# Patient Record
Sex: Female | Born: 1955
Health system: Southern US, Community
[De-identification: ages and names within clinical notes are randomized; demographics above are authoritative.]

## PROBLEM LIST (undated history)

## (undated) DIAGNOSIS — G4730 Sleep apnea, unspecified: Secondary | ICD-10-CM

## (undated) DIAGNOSIS — K760 Fatty (change of) liver, not elsewhere classified: Secondary | ICD-10-CM

## (undated) DIAGNOSIS — G8929 Other chronic pain: Secondary | ICD-10-CM

## (undated) DIAGNOSIS — E079 Disorder of thyroid, unspecified: Secondary | ICD-10-CM

## (undated) DIAGNOSIS — K648 Other hemorrhoids: Secondary | ICD-10-CM

## (undated) DIAGNOSIS — R109 Unspecified abdominal pain: Secondary | ICD-10-CM

## (undated) DIAGNOSIS — K7460 Unspecified cirrhosis of liver: Secondary | ICD-10-CM

## (undated) DIAGNOSIS — M79672 Pain in left foot: Secondary | ICD-10-CM

## (undated) DIAGNOSIS — E559 Vitamin D deficiency, unspecified: Secondary | ICD-10-CM

## (undated) DIAGNOSIS — E039 Hypothyroidism, unspecified: Secondary | ICD-10-CM

## (undated) DIAGNOSIS — Z87442 Personal history of urinary calculi: Secondary | ICD-10-CM

## (undated) DIAGNOSIS — D689 Coagulation defect, unspecified: Secondary | ICD-10-CM

## (undated) DIAGNOSIS — M797 Fibromyalgia: Secondary | ICD-10-CM

## (undated) DIAGNOSIS — N200 Calculus of kidney: Secondary | ICD-10-CM

## (undated) DIAGNOSIS — F419 Anxiety disorder, unspecified: Secondary | ICD-10-CM

## (undated) DIAGNOSIS — I2699 Other pulmonary embolism without acute cor pulmonale: Secondary | ICD-10-CM

## (undated) DIAGNOSIS — F329 Major depressive disorder, single episode, unspecified: Secondary | ICD-10-CM

## (undated) DIAGNOSIS — N939 Abnormal uterine and vaginal bleeding, unspecified: Secondary | ICD-10-CM

## (undated) DIAGNOSIS — M069 Rheumatoid arthritis, unspecified: Secondary | ICD-10-CM

## (undated) DIAGNOSIS — R945 Abnormal results of liver function studies: Secondary | ICD-10-CM

## (undated) DIAGNOSIS — K219 Gastro-esophageal reflux disease without esophagitis: Secondary | ICD-10-CM

## (undated) DIAGNOSIS — R112 Nausea with vomiting, unspecified: Secondary | ICD-10-CM

## (undated) DIAGNOSIS — N809 Endometriosis, unspecified: Secondary | ICD-10-CM

## (undated) DIAGNOSIS — M1990 Unspecified osteoarthritis, unspecified site: Secondary | ICD-10-CM

## (undated) DIAGNOSIS — K2970 Gastritis, unspecified, without bleeding: Secondary | ICD-10-CM

## (undated) DIAGNOSIS — F32A Depression, unspecified: Secondary | ICD-10-CM

## (undated) DIAGNOSIS — Z8489 Family history of other specified conditions: Secondary | ICD-10-CM

## (undated) DIAGNOSIS — K589 Irritable bowel syndrome without diarrhea: Secondary | ICD-10-CM

## (undated) DIAGNOSIS — I10 Essential (primary) hypertension: Secondary | ICD-10-CM

## (undated) DIAGNOSIS — L4050 Arthropathic psoriasis, unspecified: Secondary | ICD-10-CM

## (undated) DIAGNOSIS — Z9889 Other specified postprocedural states: Secondary | ICD-10-CM

## (undated) HISTORY — PX: TUBAL LIGATION: SHX77

## (undated) HISTORY — DX: Other pulmonary embolism without acute cor pulmonale: I26.99

## (undated) HISTORY — DX: Rheumatoid arthritis, unspecified: M06.9

## (undated) HISTORY — DX: Unspecified cirrhosis of liver: K74.60

## (undated) HISTORY — DX: Anxiety disorder, unspecified: F41.9

## (undated) HISTORY — DX: Coagulation defect, unspecified: D68.9

## (undated) HISTORY — PX: ABDOMINAL SURGERY: SHX537

## (undated) HISTORY — DX: Gastro-esophageal reflux disease without esophagitis: K21.9

## (undated) HISTORY — DX: Abnormal results of liver function studies: R94.5

## (undated) HISTORY — DX: Abnormal uterine and vaginal bleeding, unspecified: N93.9

## (undated) HISTORY — DX: Endometriosis, unspecified: N80.9

## (undated) HISTORY — DX: Unspecified osteoarthritis, unspecified site: M19.90

## (undated) HISTORY — PX: BREAST LUMPECTOMY: SHX2

## (undated) HISTORY — DX: Arthropathic psoriasis, unspecified: L40.50

## (undated) HISTORY — DX: Fatty (change of) liver, not elsewhere classified: K76.0

## (undated) HISTORY — DX: Calculus of kidney: N20.0

## (undated) HISTORY — DX: Morbid (severe) obesity due to excess calories: E66.01

## (undated) HISTORY — DX: Vitamin D deficiency, unspecified: E55.9

## (undated) HISTORY — PX: DILATION AND CURETTAGE OF UTERUS: SHX78

---

## 2002-02-12 ENCOUNTER — Ambulatory Visit (HOSPITAL_COMMUNITY): Admission: RE | Admit: 2002-02-12 | Discharge: 2002-02-12 | Payer: Self-pay | Admitting: Family Medicine

## 2002-02-12 ENCOUNTER — Encounter: Payer: Self-pay | Admitting: Family Medicine

## 2002-10-07 ENCOUNTER — Encounter: Payer: Self-pay | Admitting: Otolaryngology

## 2002-10-07 ENCOUNTER — Ambulatory Visit (HOSPITAL_COMMUNITY): Admission: RE | Admit: 2002-10-07 | Discharge: 2002-10-07 | Payer: Self-pay | Admitting: Otolaryngology

## 2004-04-19 ENCOUNTER — Ambulatory Visit (HOSPITAL_COMMUNITY): Admission: RE | Admit: 2004-04-19 | Discharge: 2004-04-19 | Payer: Self-pay | Admitting: General Surgery

## 2004-05-04 ENCOUNTER — Ambulatory Visit (HOSPITAL_COMMUNITY): Admission: RE | Admit: 2004-05-04 | Discharge: 2004-05-04 | Payer: Self-pay | Admitting: Family Medicine

## 2004-05-27 ENCOUNTER — Ambulatory Visit (HOSPITAL_COMMUNITY): Admission: RE | Admit: 2004-05-27 | Discharge: 2004-05-27 | Payer: Self-pay | Admitting: Family Medicine

## 2004-06-01 ENCOUNTER — Ambulatory Visit (HOSPITAL_COMMUNITY): Admission: RE | Admit: 2004-06-01 | Discharge: 2004-06-01 | Payer: Self-pay | Admitting: Family Medicine

## 2004-06-09 ENCOUNTER — Ambulatory Visit (HOSPITAL_COMMUNITY): Admission: RE | Admit: 2004-06-09 | Discharge: 2004-06-09 | Payer: Self-pay | Admitting: Pulmonary Disease

## 2004-07-10 HISTORY — PX: COLONOSCOPY: SHX174

## 2004-11-28 ENCOUNTER — Ambulatory Visit (HOSPITAL_COMMUNITY): Admission: RE | Admit: 2004-11-28 | Discharge: 2004-11-28 | Payer: Self-pay | Admitting: Family Medicine

## 2005-05-19 ENCOUNTER — Ambulatory Visit (HOSPITAL_COMMUNITY): Admission: RE | Admit: 2005-05-19 | Discharge: 2005-05-19 | Payer: Self-pay | Admitting: Family Medicine

## 2005-05-26 ENCOUNTER — Ambulatory Visit (HOSPITAL_COMMUNITY): Admission: RE | Admit: 2005-05-26 | Discharge: 2005-05-26 | Payer: Self-pay | Admitting: Family Medicine

## 2005-12-08 ENCOUNTER — Inpatient Hospital Stay (HOSPITAL_COMMUNITY): Admission: EM | Admit: 2005-12-08 | Discharge: 2005-12-13 | Payer: Self-pay | Admitting: Emergency Medicine

## 2005-12-08 DIAGNOSIS — I2699 Other pulmonary embolism without acute cor pulmonale: Secondary | ICD-10-CM

## 2005-12-08 HISTORY — DX: Other pulmonary embolism without acute cor pulmonale: I26.99

## 2006-07-05 ENCOUNTER — Ambulatory Visit (HOSPITAL_COMMUNITY): Admission: RE | Admit: 2006-07-05 | Discharge: 2006-07-05 | Payer: Self-pay | Admitting: Family Medicine

## 2006-08-22 ENCOUNTER — Ambulatory Visit: Payer: Self-pay | Admitting: Gastroenterology

## 2006-08-24 ENCOUNTER — Encounter (INDEPENDENT_AMBULATORY_CARE_PROVIDER_SITE_OTHER): Payer: Self-pay | Admitting: *Deleted

## 2006-08-24 ENCOUNTER — Ambulatory Visit: Payer: Self-pay | Admitting: Gastroenterology

## 2006-08-24 ENCOUNTER — Ambulatory Visit (HOSPITAL_COMMUNITY): Admission: RE | Admit: 2006-08-24 | Discharge: 2006-08-24 | Payer: Self-pay | Admitting: Gastroenterology

## 2006-08-24 HISTORY — PX: ESOPHAGOGASTRODUODENOSCOPY: SHX1529

## 2006-10-10 ENCOUNTER — Ambulatory Visit: Payer: Self-pay | Admitting: Gastroenterology

## 2006-11-27 ENCOUNTER — Ambulatory Visit: Payer: Self-pay | Admitting: Gastroenterology

## 2007-02-07 ENCOUNTER — Ambulatory Visit: Payer: Self-pay | Admitting: Gastroenterology

## 2007-04-03 ENCOUNTER — Ambulatory Visit (HOSPITAL_COMMUNITY): Admission: RE | Admit: 2007-04-03 | Discharge: 2007-04-03 | Payer: Self-pay | Admitting: Urology

## 2007-04-23 ENCOUNTER — Encounter: Admission: RE | Admit: 2007-04-23 | Discharge: 2007-04-23 | Payer: Self-pay | Admitting: Orthopedic Surgery

## 2007-11-13 ENCOUNTER — Ambulatory Visit (HOSPITAL_COMMUNITY): Admission: RE | Admit: 2007-11-13 | Discharge: 2007-11-13 | Payer: Self-pay | Admitting: Family Medicine

## 2007-11-18 ENCOUNTER — Ambulatory Visit (HOSPITAL_COMMUNITY): Admission: RE | Admit: 2007-11-18 | Discharge: 2007-11-18 | Payer: Self-pay | Admitting: Family Medicine

## 2008-04-17 ENCOUNTER — Ambulatory Visit (HOSPITAL_COMMUNITY): Admission: RE | Admit: 2008-04-17 | Discharge: 2008-04-17 | Payer: Self-pay | Admitting: Family Medicine

## 2008-06-01 ENCOUNTER — Ambulatory Visit (HOSPITAL_COMMUNITY): Admission: RE | Admit: 2008-06-01 | Discharge: 2008-06-01 | Payer: Self-pay | Admitting: Family Medicine

## 2008-06-24 ENCOUNTER — Encounter (INDEPENDENT_AMBULATORY_CARE_PROVIDER_SITE_OTHER): Payer: Self-pay | Admitting: Obstetrics and Gynecology

## 2008-06-24 ENCOUNTER — Ambulatory Visit (HOSPITAL_COMMUNITY): Admission: RE | Admit: 2008-06-24 | Discharge: 2008-06-24 | Payer: Self-pay | Admitting: Obstetrics and Gynecology

## 2008-07-06 ENCOUNTER — Ambulatory Visit (HOSPITAL_COMMUNITY): Admission: RE | Admit: 2008-07-06 | Discharge: 2008-07-06 | Payer: Self-pay | Admitting: Family Medicine

## 2008-07-09 DIAGNOSIS — K2970 Gastritis, unspecified, without bleeding: Secondary | ICD-10-CM

## 2008-07-09 DIAGNOSIS — G4730 Sleep apnea, unspecified: Secondary | ICD-10-CM | POA: Insufficient documentation

## 2008-07-09 DIAGNOSIS — E1169 Type 2 diabetes mellitus with other specified complication: Secondary | ICD-10-CM | POA: Insufficient documentation

## 2008-07-09 DIAGNOSIS — E782 Mixed hyperlipidemia: Secondary | ICD-10-CM | POA: Insufficient documentation

## 2008-07-09 DIAGNOSIS — N200 Calculus of kidney: Secondary | ICD-10-CM

## 2008-07-09 DIAGNOSIS — I10 Essential (primary) hypertension: Secondary | ICD-10-CM | POA: Insufficient documentation

## 2008-07-09 DIAGNOSIS — D1739 Benign lipomatous neoplasm of skin and subcutaneous tissue of other sites: Secondary | ICD-10-CM | POA: Insufficient documentation

## 2008-07-09 DIAGNOSIS — E785 Hyperlipidemia, unspecified: Secondary | ICD-10-CM

## 2008-07-09 DIAGNOSIS — I2699 Other pulmonary embolism without acute cor pulmonale: Secondary | ICD-10-CM | POA: Insufficient documentation

## 2008-07-09 DIAGNOSIS — F329 Major depressive disorder, single episode, unspecified: Secondary | ICD-10-CM

## 2008-07-09 DIAGNOSIS — K2990 Gastroduodenitis, unspecified, without bleeding: Secondary | ICD-10-CM

## 2008-07-09 DIAGNOSIS — F419 Anxiety disorder, unspecified: Secondary | ICD-10-CM | POA: Insufficient documentation

## 2008-07-09 DIAGNOSIS — J40 Bronchitis, not specified as acute or chronic: Secondary | ICD-10-CM | POA: Insufficient documentation

## 2008-07-09 DIAGNOSIS — R109 Unspecified abdominal pain: Secondary | ICD-10-CM | POA: Insufficient documentation

## 2008-07-09 HISTORY — DX: Other pulmonary embolism without acute cor pulmonale: I26.99

## 2008-07-09 HISTORY — DX: Calculus of kidney: N20.0

## 2008-07-27 ENCOUNTER — Ambulatory Visit (HOSPITAL_COMMUNITY): Admission: RE | Admit: 2008-07-27 | Discharge: 2008-07-27 | Payer: Self-pay | Admitting: Pulmonary Disease

## 2009-03-29 ENCOUNTER — Ambulatory Visit (HOSPITAL_COMMUNITY): Admission: RE | Admit: 2009-03-29 | Discharge: 2009-03-29 | Payer: Self-pay | Admitting: Internal Medicine

## 2009-04-01 ENCOUNTER — Encounter (INDEPENDENT_AMBULATORY_CARE_PROVIDER_SITE_OTHER): Payer: Self-pay | Admitting: *Deleted

## 2009-04-22 ENCOUNTER — Encounter: Payer: Self-pay | Admitting: Gastroenterology

## 2009-04-22 ENCOUNTER — Ambulatory Visit: Payer: Self-pay | Admitting: Gastroenterology

## 2009-04-22 DIAGNOSIS — R1310 Dysphagia, unspecified: Secondary | ICD-10-CM | POA: Insufficient documentation

## 2009-04-22 DIAGNOSIS — R1031 Right lower quadrant pain: Secondary | ICD-10-CM

## 2009-04-22 DIAGNOSIS — K589 Irritable bowel syndrome without diarrhea: Secondary | ICD-10-CM

## 2009-04-22 DIAGNOSIS — R1011 Right upper quadrant pain: Secondary | ICD-10-CM | POA: Insufficient documentation

## 2009-04-29 ENCOUNTER — Encounter: Payer: Self-pay | Admitting: Gastroenterology

## 2009-04-29 DIAGNOSIS — Z8639 Personal history of other endocrine, nutritional and metabolic disease: Secondary | ICD-10-CM

## 2009-04-29 DIAGNOSIS — Z862 Personal history of diseases of the blood and blood-forming organs and certain disorders involving the immune mechanism: Secondary | ICD-10-CM | POA: Insufficient documentation

## 2009-04-30 ENCOUNTER — Encounter: Payer: Self-pay | Admitting: Gastroenterology

## 2009-04-30 LAB — CONVERTED CEMR LAB
ALT: 39 units/L — ABNORMAL HIGH (ref 0–35)
Albumin: 4 g/dL (ref 3.5–5.2)
BUN: 12 mg/dL (ref 6–23)
CO2: 24 meq/L (ref 19–32)
Calcium: 9 mg/dL (ref 8.4–10.5)
Chloride: 102 meq/L (ref 96–112)
Creatinine, Ser: 0.76 mg/dL (ref 0.40–1.20)
Eosinophils Absolute: 0.1 10*3/uL (ref 0.0–0.7)
Eosinophils Relative: 1 % (ref 0–5)
HCT: 43.1 % (ref 36.0–46.0)
Hemoglobin: 14.7 g/dL (ref 12.0–15.0)
Lipase: 32 units/L (ref 0–75)
Lymphocytes Relative: 29 % (ref 12–46)
Lymphs Abs: 3.1 10*3/uL (ref 0.7–4.0)
MCV: 94.5 fL (ref 78.0–100.0)
Monocytes Relative: 9 % (ref 3–12)
Potassium: 3.7 meq/L (ref 3.5–5.3)
RBC: 4.56 M/uL (ref 3.87–5.11)
WBC: 10.4 10*3/uL (ref 4.0–10.5)

## 2009-05-03 ENCOUNTER — Ambulatory Visit (HOSPITAL_COMMUNITY): Admission: RE | Admit: 2009-05-03 | Discharge: 2009-05-03 | Payer: Self-pay | Admitting: Gastroenterology

## 2009-05-26 ENCOUNTER — Encounter (INDEPENDENT_AMBULATORY_CARE_PROVIDER_SITE_OTHER): Payer: Self-pay

## 2009-06-21 ENCOUNTER — Encounter: Payer: Self-pay | Admitting: Gastroenterology

## 2009-07-27 LAB — CONVERTED CEMR LAB
ALT: 36 units/L — ABNORMAL HIGH (ref 0–35)
Indirect Bilirubin: 0.4 mg/dL (ref 0.0–0.9)
Total Protein: 7.5 g/dL (ref 6.0–8.3)

## 2009-10-19 ENCOUNTER — Ambulatory Visit: Payer: Self-pay | Admitting: Gastroenterology

## 2009-12-21 ENCOUNTER — Ambulatory Visit: Payer: Self-pay | Admitting: Gastroenterology

## 2009-12-21 DIAGNOSIS — K219 Gastro-esophageal reflux disease without esophagitis: Secondary | ICD-10-CM

## 2009-12-21 HISTORY — DX: Gastro-esophageal reflux disease without esophagitis: K21.9

## 2010-03-16 ENCOUNTER — Encounter (HOSPITAL_COMMUNITY): Admission: RE | Admit: 2010-03-16 | Discharge: 2010-03-16 | Payer: Self-pay | Admitting: Orthopaedic Surgery

## 2010-04-02 ENCOUNTER — Emergency Department (HOSPITAL_COMMUNITY): Admission: EM | Admit: 2010-04-02 | Discharge: 2010-04-02 | Payer: Self-pay | Admitting: Emergency Medicine

## 2010-05-03 ENCOUNTER — Ambulatory Visit: Payer: Self-pay | Admitting: Gastroenterology

## 2010-07-31 ENCOUNTER — Encounter: Payer: Self-pay | Admitting: Obstetrics and Gynecology

## 2010-08-09 NOTE — Assessment & Plan Note (Signed)
Summary: IBS-MIXED   Visit Type:  Follow-up Visit Referring Provider:  JIRCVEL  Primary Care Provider:  Hilma Favors, M.D.   History of Present Illness: C/o dry mouth. BMs: better-at least every 2 days. Taking Amitiza two times a day. Imipramine helping with depression and bowels. No acid medicines. Told she had reflux by a doctor due to her hoarseness. Probiotics worked really.   Current Medications (verified): 1)  Bisoprolol-Hydrochlorothiazide 5-6.25 Mg Tabs (Bisoprolol-Hydrochlorothiazide) .... Once Daily 2)  Meclizine Hcl 25 Mg Tabs (Meclizine Hcl) .... Four Times A Day As Needed 3)  Xanax 1 Mg Tabs (Alprazolam) .... Take 1 Tablet By Mouth Three Times A Day As Needed 4)  Restasis 0.05 % Emul (Cyclosporine) .... Two Times A Day 5)  Tylenol Extra Strength 500 Mg Tabs (Acetaminophen) .... As Needed 6)  Omega-3 1000 Mg Caps (Omega-3 Fatty Acids) .... Once A Day 7)  Micardis 40 Mg Tabs (Telmisartan) .... Take 1 Tablet By Mouth Once A Day 8)  Savella 100 Mg Tabs (Milnacipran Hcl) .... Take 1 Tablet By Mouth Two Times A Day 9)  Stool Softner .... One To Two Tablets Daily 10)  Excedrin Tension .... As Needed 11)  Moisturizing Dry Eye Solution .... As Needed 12)  Albuterol  Neb .... As Needed 13)  C-Pap .... Nightly 14)  Concerta 36 Mg Cr-Tabs (Methylphenidate Hcl) .... Take 1 Tablet By Mouth Once A Day 15)  Tramadol Hcl 50 Mg Tabs (Tramadol Hcl) .... One -Two Tablets Every 8 Hours As  Needed 16)  Zolpidem Tartrate 10 Mg Tabs (Zolpidem Tartrate) .... Take 1 Tab By Mouth At Bedtime As Needed 17)  Levothroid 50 Mcg Tabs (Levothyroxine Sodium) .... Take 1 Tablet By Mouth Once A Day 18)  Clotrimazole and Betamethasone Cream .... As Directed 19)  Vitamin D2000 Mcg .... One Tablet Daily 20)  Azo .... As Needed 21)  Imipramine Hcl 10 Mg Tabs (Imipramine Hcl) .Marland Kitchen.. 1 By Mouth At Bedtime For 3 Days Then 2 By Mouth At Bedtime For Bedtime Then 3 By Mouth Qhs 22)  Amitiza 8 Mcg Caps (Lubiprostone) .Marland Kitchen.. 1  By Mouth At Bedtime For 7 Days Then Bid 23)  Levothroxine .Marland Kitchen.. 39mg Once A Day 24)  Methylphenidate ..Marland KitchenMarland KitchenMarland Kitchen378mOnce Daily  Allergies: 1)  ! Sulfa  Past History:  Past Medical History: Last updated: 10/19/2009 Chronic lower abdominal pain and intermittent loose stools and constipation likely secondary to irritable bowel syndrome Morbid obesity Sleep apnea Heme-positive stools secondary to hemorrhoids and gastritis ** EGD Feb 2008: BX-mild to moderate chronic gastritis. 2006 Colonoscopy: Internal hemorrhoids (MJ). 2010: LUQ Shingles  Depression History of pulmonary embolus requiring Coumadin therapy Hypertension Kidney stones Hyperlipidemia Chronic bronchitis History of pneumonia  Past Surgical History: Last updated: 04/22/2009 c-section x2 kidney stone removal R breast cyst removal exploratory laparoscopy endometrial polypectomy, D&C in 2009  Social History: Last updated: 12/21/2009 Smokes 1 pk per year. Now not smoking. Rare glass of wine. 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 Transferred jobs. Never has a chance to slow down. Doesn't like her new job. 2 children. divorced. She is raising her deceased niece's daughter who is 1232ears old  Social History: Smokes 1 pk per year. Now not smoking. Rare glass of wine. 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 Transferred jobs. Never has a chance to slow down. Doesn't like her new job. 2 children. divorced. She is raising  her deceased niece's daughter who is 55 years old  Vital Signs:  Patient profile:   55 year old female Height:      64 inches Weight:      243 pounds BMI:     41.86 Temp:     98.0 degrees F oral Pulse rate:   76 / minute BP sitting:   122 / 82  (right arm)  Vitals Entered By: Burnadette Peter LPN (December 22, 347 6:11 PM)  Physical Exam  General:  Well  developed, well nourished, no acute distress. Head:  Normocephalic and atraumatic. Lungs:  Clear throughout to auscultation. Heart:  Regular rate and rhythm; no murmurs. Abdomen:  Soft, nontender and nondistended.Normal bowel sounds.  Impression & Recommendations:  Problem # 1:  IRRITABLE BOWEL SYNDROME (ICD-564.1) Assessment Improved  Continue Imipramine. Continue Amitiza. Increase to 24 micrograms two times a day. Cautioned it may cause nausea. DA-C daily, #60 given. OPV in 4 mos.  Orders: Est. Patient Level III (64353)  Problem # 2:  GERD (ICD-530.81) Assessment: Unchanged  Add OMP 30 minutes prior to meals. OPV in 4 mos. Continue with weight loss effort.  CC: PCP  Orders: Est. Patient Level III (91225) Prescriptions: PRILOSEC 20 MG CPDR (OMEPRAZOLE) 1 30 minutes prior to meals  #30 x 5   Entered and Authorized by:   Danie Binder MD   Signed by:   Danie Binder MD on 12/21/2009   Method used:   Electronically to        [facility] (retail)       [place] [address] [place], [place]  [address]       Ph: [phone]       Fax: [phone]   RxID:   [id number] AMITIZA 24 MCG CAPS (LUBIPROSTONE) 1 by mouth BID  #60 x 5   Entered and Authorized by:   Danie Binder MD   Signed by:   Danie Binder MD on 12/21/2009   Method used:   Electronically to        [facility] (retail)       [place] [address] [place], [place]  [address]       Ph: [phone]       Fax: [phone]   RxID:   [id number]

## 2010-08-09 NOTE — Assessment & Plan Note (Signed)
Summary: IBS-MIXED   Visit Type:  Follow-up Visit  Chief Complaint:  F/U abd pain/no appetite.  History of Present Illness: Hurting in her stomach. Upr and rights side,and RLQ. No appetite. Makes self eat or drink. Eat and then pain or when she wakes up she has pain. Like IBS-went all the time and now constipation. Taken OTC stool softeners. Eating vegetarian. If eats beef or steak it really hurts. All kinds of stress: work, adult children in the home (2) plus grandkids, taking care of dead niece's child, money. Feels fluttering in heart. Nausea all the the time. No vomiting. Rare problems swallowing, not often.    Current Medications (verified): 1)  Bisoprolol-Hydrochlorothiazide 5-6.25 Mg Tabs (Bisoprolol-Hydrochlorothiazide) .... Once Daily 2)  Meclizine Hcl 25 Mg Tabs (Meclizine Hcl) .... Four Times A Day As Needed 3)  Xanax 1 Mg Tabs (Alprazolam) .... Take 1 Tablet By Mouth Three Times A Day As Needed 4)  Restasis 0.05 % Emul (Cyclosporine) .... Two Times A Day 5)  Tylenol Extra Strength 500 Mg Tabs (Acetaminophen) .... As Needed 6)  Omega-3 350 Mg Caps (Omega-3 Fatty Acids) .... 3 Once Daily 7)  Micardis 40 Mg Tabs (Telmisartan) .... Take 1 Tablet By Mouth Once A Day 8)  Savella 100 Mg Tabs (Milnacipran Hcl) .... Take 1 Tablet By Mouth Two Times A Day 9)  Stool Softner .... One To Two Tablets Daily 10)  Excedrin Tension .... As Needed 11)  Moisturizing Dry Eye Solution .... As Needed 12)  Albuterol  Neb .... As Needed 13)  C-Pap .... Nightly 14)  Concerta 36 Mg Cr-Tabs (Methylphenidate Hcl) .... Take 1 Tablet By Mouth Once A Day 15)  Tramadol Hcl 50 Mg Tabs (Tramadol Hcl) .... One -Two Tablets Every 8 Hours As  Needed 16)  Dicyclomine Hcl 10 Mg Caps (Dicyclomine Hcl) .... As Needed 17)  Rezyst Im  Chew (Probiotic Product) .... One Chewable Tablet Daily 18)  Zolpidem Tartrate 10 Mg Tabs (Zolpidem Tartrate) .... Take 1 Tab By Mouth At Bedtime As Needed 19)  Levothroid 50 Mcg Tabs  (Levothyroxine Sodium) .... Take 1 Tablet By Mouth Once A Day 20)  Clotrimazole and Betamethasone Cream .... As Directed 21)  Vitamin D2000 Mcg .... One Tablet Daily 22)  Azo .... As Needed  Allergies (verified): 1)  ! Sulfa  Past History:  Past Surgical History: Last updated: 04/22/2009 c-section x2 kidney stone removal R breast cyst removal exploratory laparoscopy endometrial polypectomy, D&C in 2009  Past Medical History: Chronic lower abdominal pain and intermittent loose stools and constipation likely secondary to irritable bowel syndrome Morbid obesity Sleep apnea Heme-positive stools secondary to hemorrhoids and gastritis ** EGD Feb 2008: BX-mild to moderate chronic gastritis. 2006 Colonoscopy: Internal hemorrhoids (MJ). 2010: LUQ Shingles  Depression History of pulmonary embolus requiring Coumadin therapy Hypertension Kidney stones Hyperlipidemia Chronic bronchitis History of pneumonia  Family History: Reviewed history from 04/22/2009 and no changes required. mother, history of leukemia, diabetes, stroke Father, CAD with CABG Daughter, fibromyalgia and kidney stones 2 brothers with history depression, diabetes, kidney signs 2 nieces, one deceased, Wilson's disease No family history of colon cancer.  Social History: Smokes 1 ppd per year. Rare glass of wine. 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 Transferred jobs. Never has a chance to slow down. Doesn't like her new job. 2 children. divorced. She is raising her deceased niece's daughter who is 57 years old  Vital Signs:  Patient profile:  55 year old female Height:      64 inches Weight:      246 pounds BMI:     42.38 Temp:     98.2 degrees F oral Pulse rate:   80 / minute BP sitting:   110 / 76  (left arm) Cuff size:   large  Vitals Entered By: Waldon Merl LPN (October 19, 2109 7:35 PM)  Physical Exam  General:  Well  developed, well nourished, no acute distress. Head:  Normocephalic and atraumatic. Eyes:  PERRLA, no icterus. Mouth:  No deformity or lesions. Lungs:  Clear throughout to auscultation. Heart:  Regular rate and rhythm; no murmurs. Abdomen:  Soft, mild to mod  TTPx4 without guarding and without rebound and nondistended. Normal bowel sounds. Extremities:  No edema noted. Neurologic:  Alert and  oriented x4;  grossly normal neurologically.  Impression & Recommendations:  Problem # 1:  IRRITABLE BOWEL SYNDROME (ICD-564.1)  currently has many psycho social stressors. PT HAS IBS-MIXED PATTERN.  Stop using dicyclomine. Start Probiotics daily. Add Amitiza to help with BMs. Add Imipramine 10 mg at bedtime and increase to 30 mg. Avoid dairy. HO GIVEN. Return visit 2 mos.Samples of Amitiza and DA-IBS given. OPV in 2 mos.  cc: PCP  Orders: Est. Patient Level V (67014)  Patient Instructions: 1)  Stop using dicyclomine. 2)  Start Probiotics daily 3)  Add Amitiza to help with BMs 4)  Add Imipramine 10 mg at bedtime and increase to 30 mg. 5)  Avoid dairy. 6)  Return visit 2 mos. 7)  The medication list was reviewed and reconciled.  All changed / newly prescribed medications were explained.  A complete medication list was provided to the patient / caregiver. Prescriptions: AMITIZA 8 MCG CAPS (LUBIPROSTONE) 1 by mouth at bedtime for 7 days then BID  #60 x 5   Entered and Authorized by:   Danie Binder MD   Signed by:   Danie Binder MD on 10/19/2009   Method used:   Electronically to        [facility] (retail)       [place] [address] [place], [place]  [address]       Ph: [phone]       Fax: [phone]   RxID:   [id number] IMIPRAMINE HCL 10 MG TABS (IMIPRAMINE HCL) 1 by mouth at bedtime for 3 days then 2 by mouth at bedtime for bedtime then 3 by mouth qhs  #90 x 5   Entered and Authorized by:   Danie Binder MD   Signed by:   Danie Binder MD  on 10/19/2009   Method used:   Electronically to        [facility] (retail)       [place] [address]       [place],   [address]       Ph: [phone]       Fax: [phone]   RxID:   [id number]       Appended Document: IBS-MIXED 38monthfollow up appt made for June 14 w/ SLF

## 2010-08-09 NOTE — Assessment & Plan Note (Signed)
Summary: GERD, IBS   Visit Type:  Follow-up Visit Primary Care Provider:  Hilma Favors, M.D.   Chief Complaint:  F/U [place]/IBS.  History of Present Illness: Just here for follow up and doing fairly well.  Not taking OTc stool softener. Taking AMitiza two times a day and Imipramine. Sx under control. BM: daily. Rare abd pain. No hb/indigestion. No NV. Diarrhea: [place] 1x/week, likely something she's eating. Eating more of vegetarian diet. Making a steady decline on her weight. Feeling better. Lost 21 lbs since APR 2011.  Current Medications (verified): 1)  Bisoprolol-Hydrochlorothiazide 5-6.25 Mg Tabs (Bisoprolol-Hydrochlorothiazide) .... Once Daily 2)  Meclizine Hcl 25 Mg Tabs (Meclizine Hcl) .... Four Times A Day As Needed 3)  Xanax 1 Mg Tabs (Alprazolam) .... Take 1 Tablet By Mouth Three Times A Day As Needed 4)  Restasis 0.05 % Emul (Cyclosporine) .... Two Times A Day As Needed 5)  Tylenol Extra Strength 500 Mg Tabs (Acetaminophen) .... As Needed 6)  Omega-3 1000 Mg Caps (Omega-3 Fatty Acids) .... Once A Day 7)  Micardis 40 Mg Tabs (Telmisartan) .... Take 1 Tablet By Mouth Once A Day 8)  Savella 100 Mg Tabs (Milnacipran Hcl) .... Take 1 Tablet By Mouth Two Times A Day 9)  Stool Softner .... One To Two Tablets Daily As Needed 10)  Excedrin Tension .... As Needed 11)  Moisturizing Dry Eye Solution .... As Needed 12)  Albuterol  Neb .... As Needed 13)  C-Pap .... Nightly 14)  Concerta 36 Mg Cr-Tabs (Methylphenidate Hcl) .... Take 1 Tablet By Mouth Once A Day 15)  Tramadol Hcl 50 Mg Tabs (Tramadol Hcl) .... One -Two Tablets Every 8 Hours As  Needed 16)  Zolpidem Tartrate 10 Mg Tabs (Zolpidem Tartrate) .... Take 1 Tab By Mouth At Bedtime As Needed 17)  Levothroid 50 Mcg Tabs (Levothyroxine Sodium) .... Take 1 Tablet By Mouth Once A Day 18)  Clotrimazole and Betamethasone Cream .... As Directed 19)  Vitamin D2000 Mcg .... One Tablet Daily 20)  Azo .... As Needed 21)  Imipramine Hcl 10 Mg Tabs  (Imipramine Hcl) .Marland Kitchen.. 1 By Mouth At Bedtime For 3 Days Then 2 By Mouth At Bedtime For Bedtime Then 3 By Mouth Qhs 22)  Amitiza 24 Mcg Caps (Lubiprostone) .Marland Kitchen.. 1 By Mouth Bid 23)  Prilosec 20 Mg Cpdr (Omeprazole) .Marland Kitchen.. 1 30 Minutes Prior To Meals 24)  Oracia 40 Mg .... Use As Directed 25)  Finacea Cream For Face .... As Directed 26)  Ultravate 0.05 % Crea (Halobetasol Propionate) .... As Directed 27)  Optive Moisturizing Dry Eye Solution .... As Directed  Allergies (verified): 1)  ! Sulfa  Past History:  Past Surgical History: Last updated: 04/22/2009 c-section x2 kidney stone removal R breast cyst removal exploratory laparoscopy endometrial polypectomy, D&C in 2009  Past Medical History: Chronic lower abdominal pain and intermittent loose stools and constipation likely secondary to irritable bowel syndrome Severe obesity Sleep apnea Heme-positive stools secondary to hemorrhoids and gastritis ** EGD Feb 2008: BX-mild to moderate chronic gastritis. 2006 Colonoscopy: Internal hemorrhoids (MJ). 2010: LUQ Shingles  Depression History of pulmonary embolus requiring Coumadin therapy Hypertension Kidney stones Hyperlipidemia Chronic bronchitis History of pneumonia  Vital Signs:  Patient profile:   55 year old female Height:      64 inches Weight:      227 pounds BMI:     39.11 Temp:     97.7 degrees F oral Pulse rate:   76 / minute BP sitting:   130 /  82  (left arm)  Vitals Entered By: Waldon Merl LPN (May 03, 2022 1:18 PM)  Physical Exam  General:  Well developed, well nourished, no acute distress. Head:  Normocephalic and atraumatic. Lungs:  Clear throughout to auscultation. Heart:  Regular rate and rhythm; no murmurs. Abdomen:  Soft, nontender and nondistended.Normal bowel sounds. Extremities:  No edema noted.  Impression & Recommendations:  Problem # 1:  GERD (ICD-530.81) Assessment Improved use OMP as needed or once daily if Sx persist. OPV in 6 mos.  encouraged continue weight loss.  Problem # 2:  IRRITABLE BOWEL SYNDROME (ICD-564.1) Assessment: Improved Continue Imipramine & Amitiza. OPV in 6 mos.  CC: PCP Prescriptions: PRILOSEC 20 MG CPDR (OMEPRAZOLE) 1 30 minutes prior to meals  #30 x 5   Entered and Authorized by:   Danie Binder MD   Signed by:   Danie Binder MD on 05/03/2010   Method used:   Electronically to        [facility] (retail)       [place] [address] [place], [place]  [address]       Ph: [phone]       Fax: [phone]   RxID:   [id number] AMITIZA 24 MCG CAPS (LUBIPROSTONE) 1 by mouth BID  #60 x 5   Entered and Authorized by:   Danie Binder MD   Signed by:   Danie Binder MD on 05/03/2010   Method used:   Electronically to        [facility] (retail)       [place] [address] [place], [place]  [address]       Ph: [phone]       Fax: [phone]   RxID:   [id number] IMIPRAMINE HCL 10 MG TABS (IMIPRAMINE HCL) 1 by mouth at bedtime for 3 days then 2 by mouth at bedtime for bedtime then 3 by mouth qhs  #90 x 5   Entered and Authorized by:   Danie Binder MD   Signed by:   Danie Binder MD on 05/03/2010   Method used:   Electronically to        [facility] (retail)       [place] [address]       [place],   [address]       Ph: [phone]       Fax: [phone]   RxID:   [id number]   Appended Document: Orders Update    Clinical Lists Changes  Orders: Added new Service order of Est. Patient Level II (54360) - Signed      Appended Document: GERD, IBS 6 MON OPV REMINDER IS IN THE COMPUTER

## 2010-08-15 ENCOUNTER — Ambulatory Visit (HOSPITAL_COMMUNITY)
Admission: RE | Admit: 2010-08-15 | Discharge: 2010-08-15 | Disposition: A | Discharge status: Home / Self Care | Payer: PRIVATE HEALTH INSURANCE | Source: Ambulatory Visit | Attending: Family Medicine | Admitting: Family Medicine

## 2010-08-15 ENCOUNTER — Other Ambulatory Visit (HOSPITAL_COMMUNITY): Payer: Self-pay | Admitting: Family Medicine

## 2010-08-15 DIAGNOSIS — M5030 Other cervical disc degeneration, unspecified cervical region: Secondary | ICD-10-CM | POA: Insufficient documentation

## 2010-08-15 DIAGNOSIS — M542 Cervicalgia: Secondary | ICD-10-CM

## 2010-09-22 LAB — POCT I-STAT, CHEM 8
BUN: 15 mg/dL (ref 6–23)
Calcium, Ion: 1.08 mmol/L — ABNORMAL LOW (ref 1.12–1.32)
Chloride: 101 mEq/L (ref 96–112)
Creatinine, Ser: 0.8 mg/dL (ref 0.4–1.2)
Glucose, Bld: 300 mg/dL — ABNORMAL HIGH (ref 70–99)
TCO2: 28 mmol/L (ref 0–100)

## 2010-09-22 LAB — URINALYSIS, ROUTINE W REFLEX MICROSCOPIC
Leukocytes, UA: NEGATIVE
Nitrite: POSITIVE — AB
Specific Gravity, Urine: >1.03 — ABNORMAL HIGH (ref 1.005–1.030)
Urobilinogen, UA: 2 mg/dL — ABNORMAL HIGH (ref 0.0–1.0)

## 2010-09-22 LAB — URINE MICROSCOPIC-ADD ON

## 2010-10-24 LAB — BLOOD GAS, ARTERIAL
Acid-Base Excess: 4 mmol/L — ABNORMAL HIGH (ref 0.0–2.0)
Bicarbonate: 27.9 mEq/L — ABNORMAL HIGH (ref 20.0–24.0)
O2 Saturation: 95.9 %
TCO2: 23.7 mmol/L (ref 0–100)
pCO2 arterial: 41.4 mmHg (ref 35.0–45.0)
pO2, Arterial: 76.4 mmHg — ABNORMAL LOW (ref 80.0–100.0)

## 2010-10-26 ENCOUNTER — Encounter: Payer: Self-pay | Admitting: Gastroenterology

## 2010-11-22 NOTE — Op Note (Signed)
Yolanda [name], PETRIDES NO.:  [id number]   MEDICAL RECORD NO.:  [id number]          PATIENT TYPE:  AMB   LOCATION:  [place]                           FACILITY:  [facility]   PHYSICIAN:  Lenard Galloway, M.D.   DATE OF BIRTH:  10-May-1956   DATE OF PROCEDURE:  06/24/2008  DATE OF DISCHARGE:                               OPERATIVE REPORT   PREOPERATIVE DIAGNOSES:  1. Postmenopausal bleeding.  2. Endometrial thickening.  3. Cervical stenosis.   POSTOPERATIVE DIAGNOSES:  1. Postmenopausal bleeding.  2. Endometrial polyp.  3. Cervical stenosis.   PROCEDURE:  Intraoperative transvaginal ultrasound, hysteroscopic  polypectomy.   SURGEON:  Lenard Galloway, MD   ASSISTANT:  Karrie Doffing, [place]   ANESTHESIA:  General endotracheal, paracervical block with 1% lidocaine.   IV FLUIDS:  1000 mL [place] lactate.   ESTIMATED BLOOD LOSS:  Minimal.   URINE OUTPUT:  75 mL by I and O catheterization prior to procedure   SORBITAL DEFICIT:  30 mL.   COMPLICATIONS:  None.   INDICATIONS FOR THE PROCEDURE:  The patient is a 55 year old Caucasian  female who presents with postmenopausal bleeding.  The patient presented  to her primary care Seara Hinesley with an episode of prolonged and heavy  bleeding.  The [facility] ordered an ultrasound, which showed thickened  endometrium and the possibility of uterine septum with 2 separate  uterine cavities.  The patient was sent for gynecologic evaluation and  treatment.  In the office, it was not possible to do an endometrial  biopsy due to cervical stenosis on the patient's body habitus and length  of vagina.  The patient does have a history of prior abnormal uterine  bleeding, which at that time was successfully evaluated by [place]  endometrial biopsy, which was benign.  The patient's bleeding has  stopped, and a followup ultrasound in the office immediately pre-office  documented continued thickening of the endometrial stripe of  approximately 7-8  mm.  The patient has not taking any hormone therapy  and in fact has a history of prior pulmonary embolus.  A plan is now  made to proceed with an ultrasound guided hysteroscopy with D&C, after  risks, benefits, and alternatives are reviewed.   FINDINGS:  Intraoperative transvaginal ultrasound at the beginning of  the procedure documents an endometrial stripe of 8.5 mm.  No  intrauterine septum was appreciated.  The adnexal regions could not be  visualized well, but there were no obvious masses.   The cervix was noted to be stenotic and the cervix was somewhat flush  with the vagina.  With placement of the diagnostic hysteroscope, there  was a large sessile polyp across the posterior uterine fundus, which  measured approximately 2 x 1 cm.   SPECIMENS:  The endometrial polyp was sent to Pathology.   PROCEDURE:  The patient was reidentified in the preoperative hold area.  She did receive cefotetan 1 g IV for antibiotic prophylaxis and received  both PAS stockings and TED hose for DVT prophylaxis.   In the operating room, general endotracheal anesthesia was induced and  the patient was then  placed in the dorsal lithotomy position.  The  patient had a transvaginal ultrasound performed and the findings are as  noted above.  The bladder was sterilely catheterized of urine during the  ultrasound to improve visualization.   Just after the ultrasound was performed, the patient was on completely  prepped and draped.  An exam under anesthesia was performed, but was  limited due to the patient's body habitus.   A speculum was placed inside the vagina and a single-tooth tenaculum was  placed on the anterior cervical lip.  The cervix was dilated to a #21  Pratt dilator.  The diagnostic hysteroscope was inserted under the  continuous infusion of sorbitol solution.  The findings were as noted  above.  A sharp curettage of the polyp was performed and the polyp  forceps was used to remove pieces of  the polyp.  The diagnostic  hysteroscope was reinserted and there was still remaining polyp present  and the hysteroscope was therefore removed and continued curettage of  the endometrium and use of the polyp forceps was used to remove  remaining pieces of the polyp.  This was repeated a few additional times  to assure that the polyp had been removed, which was essentially came  out in entirety with the exception of one small strand.   A paracervical block was performed at the end of the procedure in  standard fashion using total of 10 mL of 1% lidocaine.   There were no complications to the procedure.  Hemostasis was good at  the end of the procedure.  The patient was therefore awakened and  extubated and escorted to the recovery room in stable and awake  condition.  All needle, instrument, and sponge counts were correct.      Lenard Galloway, M.D.  Electronically Signed     BES/MEDQ  D:  06/24/2008  T:  06/24/2008  Job:  [id number]

## 2010-11-22 NOTE — Assessment & Plan Note (Signed)
NAME:  Yolanda [name], Yolanda [name]             CHART#:  [id number]   DATE:  11/27/2006                       DOB:  03-11-1956   REFERRING PHYSICIAN:  Dr. Elsie Lincoln.   PROBLEM LIST:  1. Chronic lower abdominal pain and intermittent loose stools with      constipation likely secondary to irritable bowel syndrome.  2. Hemoccult positive stool secondary to hemorrhoids and gastritis.  3. Mild-to-moderate chronic gastritis.  4. Pulmonary embolus requiring Coumadin therapy.  5. Hypertension.  6. Kidney stones.  7. Depression.  8. Hyperlipidemia.  9. Bronchitis.  10.Sleep apnea.  11.Allergy to sulfa.   SUBJECTIVE:  Yolanda [name] is a 55 year old female who was last seen in  April 2008.  She was complaining of lower crampy abdominal pain and  having 3-4 loose bowel movements a day.  She was started on fiber  supplements, as well as Levsin 30 minutes before meals.  She was taking  up to 6 fiber tablets a day, as well as 2 Levsin tablets a day.  She  went from having 3-4 loose stools a day to having 1 hard stool every 3  days.  She is also using Yoplait once daily.  She feels like she went  from one extreme to the other and her symptoms in her lower abdomen and  back are worse.   MEDICATIONS:  1. Lisinopril.  2. Bisoprolol/hydrochlorothiazide.  3. Simvastatin.  4. Meclizine.  5. Sertraline.  6. Xanax 1 mg b.i.d.  7. Restasis.  8. Tylenol as needed.  9. Nexium 40 mg daily.  10.Omega-3 daily.  11.Levsin 0.125 mg twice a day.   OBJECTIVE:   PHYSICAL EXAMINATION:  VITAL SIGNS:  Weight 267 pounds, height 5 feet 3  inches, BMI 47.3 (morbidly obese), temperature 98.1, blood pressure  140/84, pulse 64.  LUNGS:  Clear to auscultation bilaterally.  CARDIOVASCULAR:  Exam is a regular rhythm.  No murmur.  Normal S1 and  S2.  ABDOMEN:  Bowel sounds present, soft, nondistended, obese, mild  tenderness to palpation in all 4 quadrants without rebound or guarding.  EXTREMITIES:  No cyanosis,  clubbing, or edema.   ASSESSMENT:  Yolanda [name] is a 55 year old female with irritable bowel who  now has constipation predominate irritable bowel with the addition of  Levsin, fiber, and Yoplait.  Thank you for allowing me to see the patient in consultation.  My  recommendations follow.   RECOMMENDATIONS:  1. She is to decrease the fiber tablets to 3 daily.  2. She is to stop Levsin completely.  3. She is to continue yogurt daily.  4. She is to call me in 1 week if the loose stool returns.  We will      titrate her dose of fiber and Levsin weekly.  5. She has a followup appointment to see me in 6 weeks.       Caro Hight, M.D.  Electronically Signed     SM/MEDQ  D:  11/27/2006  T:  11/28/2006  Job:  [id number]   cc:   Leonides Grills, M.D.

## 2010-11-22 NOTE — Assessment & Plan Note (Signed)
NAMEMarland Kitchen  Yolanda [name], Yolanda [name]             CHART#:  [id number]   DATE:  02/07/2007                       DOB:  1956-02-27   REFERRING PHYSICIAN:  Leonides Grills, M.D.   PROBLEM LIST:  1. Chronic lower abdominal pain and intermittent loose stools with      constipation likely secondary to irritable bowel.  2. Heme-positive stool secondary to hemorrhoids and gastritis.  3. History of PE requiring Coumadin therapy.  4. Hypertension.  5. Kidney stones.  6. Depression.  7. Hyperlipidemia.  8. Sleep apnea.  9. Allergy to sulfa.   SUBJECTIVE:  The patient  is a 55 year old female who presents as a  return patient visit.  Overall she is feeling better. She is not taking  Levsin any longer and is not taking any of the fiber supplements.  She  has been eating healthier and her stools are formed.  She is regular  after the diet change.  She denies any heartburn or indigestion.  She  has not had any hemorrhoid flares.  She continues to take her Yoplait.  She still has her gallbladder.  She is currently having her kidney  stones managed by Dr. Hilma Favors.   OBJECTIVE:  Weight 270 pounds, height 5 feet 3 inches, temperature 99.4,  blood pressure 138/92, pulse of 80.  GENERAL:  She is in no apparent distress.  Alert and oriented x4.  LUNGS:  Her lungs are clear to auscultation bilaterally.  CARDIOVASCULAR:  Is regular rhythm with no murmur.  ABDOMEN:  Bowel sounds present, soft, nontender, nondistended.  No  rebound or guarding, obese.   ASSESSMENT:  The patient  is a 55 year old female who was initially seen  in December 2007 for heme-positive stool.  On her followup visit in  April 2008 she was complaining of low abdominal cramping relived by  bowel movements and is now symptomatically improved with dietary  changes.   Thank you for allowing me to see the patient in consultation.  My  recommendations follow.   RECOMMENDATIONS:  1. Repeat colonoscopy in 8 years.  2. She should continue her  diet modification and use Levsin as needed.  3. She may return to see me as needed.       Caro Hight, M.D.  Electronically Signed     SM/MEDQ  D:  02/07/2007  T:  02/08/2007  Job:  [id number]   cc:   Yolanda [name], M.D.

## 2010-11-25 NOTE — Discharge Summary (Signed)
Yolanda [name], Yolanda [name]            ACCOUNT NO.:  [id number]   MEDICAL RECORD NO.:  [id number]          PATIENT TYPE:  INP   LOCATION:  A223                          FACILITY:  [facility]   PHYSICIAN:  Sherrilee Gilles. Gerarda Fraction, MD  DATE OF BIRTH:  [birth year]   DATE OF ADMISSION:  12/08/2005  DATE OF DISCHARGE:  06/06/2007LH                                 DISCHARGE SUMMARY   DISCHARGE DIAGNOSES:  1.  Pulmonary embolism.  2.  Hypertension.  3.  Morbid obesity.  4.  Depression.  5.  Constipation.   DISCHARGE MEDICATIONS:  1.  Prinivil 20 mg p.o. daily.  2.  Ziac 5/6.25 two daily.  3.  Tussionex 1 tsp q.12h. p.r.n.  4.  Coumadin 10 mg p.o. daily.   HOSPITAL COURSE:  The patient was admitted with progressively increasing  dyspnea over 1 week's time.  She had bilateral leg swelling and redness  noted as well.  She had no lower extremity trauma.  She had no evidence  clinically of pulmonary infection.  She had a chest x-ray that was  nondiagnostic.  She had a CT, however, that confirmed a pulmonary embolus by  report.  She was admitted and ruled out for hypercoagulability  states/thrombophilia.  She was discharged therapeutic after IV  anticoagulation was instituted in house.  She had incidental complaint of  constipation that was treated with over-the-counter laxative.  She will  follow up in the office with serial pro times, etc., and was instructed on  her need for at least a 104-monthcourse of anticoagulants.      LSherrilee Gilles FGerarda Fraction MD  Electronically Signed     LJF/MEDQ  D:  01/11/2006  T:  01/11/2006  Job:  [id number]

## 2010-11-25 NOTE — Procedures (Signed)
[name], Yolanda [name]            ACCOUNT NO.:  [id number]   MEDICAL RECORD NO.:  [id number]          PATIENT TYPE:  OUT   LOCATION:  RESP                          FACILITY:  [facility]   PHYSICIAN:  Edward L. Luan Pulling, M.D.DATE OF BIRTH:  [birth year]   DATE OF PROCEDURE:  DATE OF DISCHARGE:                            PULMONARY FUNCTION TEST   1. Spirometry shows a mild ventilatory defect without airflow      obstruction.  2. Lung volumes are moderately reduced.  3. DLCO is moderately reduced.  4. Arterial blood gas is normal.   Noting the patient's body habitus, some of the change in lung volumes  may be related to obesity.      Edward L. Luan Pulling, M.D.  Electronically Signed     ELH/MEDQ  D:  07/29/2008  T:  07/29/2008  Job:  [id number]

## 2010-11-25 NOTE — H&P (Signed)
Yolanda [name], Yolanda [name]            ACCOUNT NO.:  [id number]   MEDICAL RECORD NO.:  [id number]          PATIENT TYPE:  INP   LOCATION:  A223                          FACILITY:  [facility]   PHYSICIAN:  Sherrilee Gilles. Gerarda Fraction, MD  DATE OF BIRTH:  1955-12-14   DATE OF ADMISSION:  12/08/2005  DATE OF DISCHARGE:  LH                                HISTORY & PHYSICAL   CHIEF COMPLAINT:  Dyspnea.   HISTORY OF PRESENT ILLNESS:  The patient has progressively increasing  dyspnea for 1 week.  She states that she had bilateral leg swelling and  redness noted with some discomfort.  There has been no lower extremity  trauma.  She denies fever, rigors, or chills.  She admitted to streaky  hemoptysis on one occasion about two days ago.  The dyspnea has gotten  worse, specifically with minimal exertion as well as some orthopnea and  wheezing noted by the patient.  She had no fever, rigors, or chills.  No  skin rash other than the erythema of the lower extremities mentioned above.   PAST MEDICAL HISTORY:  1.  Hypertension.  2.  Depression.   SURGICAL HISTORY:  Status post C-section.   SOCIAL HISTORY:  Nonsmoker, nondrinker.  No alcohol or other drug use.  No  travel.   FAMILY HISTORY:  Noncontributory except for diabetes mellitus, strongly  positive.   She is noted to be allergic to SULFA.   HOME MEDICATIONS:  She is maintained on her:  1.  Prinivil 20 mg p.o. every day.  2.  Ziac 5/6.25 every day.  3.  Xanax p.r.n.   REVIEW OF SYSTEMS:  As under HPI.  She has noted bilateral lower extremity  edema and some more protuberance of her abdomen.  She also noticed some  substernal chest squeezing associated with her dyspnea.  It should be noted  that in November 2005 she had an echocardiogram that showed normal LV  function and no valvulopathy and pulmonary function tests were normal in  December 2005.  Review of systems in detail otherwise negative.   PHYSICAL EXAMINATION:  GENERAL:  Morbidly obese, no  acute distress.  HEAD/NECK:  No JVD or adenopathy.  Neck is supple.  CHEST:  Clear.  CARDIAC:  Regular rhythm.  No murmur, gallop or rub.  ABDOMEN:  Soft.  No organomegaly or masses.  No guarding or rebound  tenderness.  EXTREMITIES:  Bipedal edema 3+ with mild erythema bilaterally.  No  lymphangitis.   LABORATORY:  Chest x-ray showed poor inspiration.  No acute disease.  Chest  CT, done in the emergency department, confirmed pulmonary embolus by report  to me verbally by the ER physician, Dr. Domingo Cocking.  Her D-dimer was noted to  be markedly elevated consistent with this.  Her arterial blood gas showed an  increase in the arterial alveolar oxygen gradient, however, she is well  oxygenated at 2 liters FIO2.   IMPRESSION:  1.  Acute pulmonary embolus, probably happened several days ago but we will      admit her for full anticoagulation and change over to oral  anticoagulant.  Since she has no risk factors except obesity, we will      screen her for hypercoagulable states.  2.  Hypertension, well controlled on outpatient regimen.  Continue in-house,      expectant observation, and intervene as needed.  3.  Increasing peripheral edema.  We will check another echocardiogram,      although cor pulmonale is possible with pulmonary emboli.  We will      administer parenteral intravenous diuretics in the meantime and monitor      electrolytes supplementing them as needed.      Sherrilee Gilles. Gerarda Fraction, MD  Electronically Signed     LJF/MEDQ  D:  12/08/2005  T:  12/08/2005  Job:  [id number]

## 2010-11-25 NOTE — Op Note (Signed)
NAMEJANETT, Yolanda [name]            ACCOUNT NO.:  [id number]   MEDICAL RECORD NO.:  [id number]          PATIENT TYPE:  AMB   LOCATION:  DAY                           FACILITY:  [facility]   PHYSICIAN:  Caro Hight, M.D.      DATE OF BIRTH:  01/19/1956   DATE OF PROCEDURE:  08/24/2006  DATE OF DISCHARGE:                               OPERATIVE REPORT   REFERRING PHYSICIAN:  Leonides Grills, M.D.   PROCEDURE:  Esophagogastroduodenoscopy with cold forceps biopsy.   INDICATION FOR EXAM:  [name] is a 55 year old female with heme-  positive stool.  She is up-to-date on her colonoscopy.  She was  diagnosed with a pulmonary embolus in June2007 and has been  maintained on Coumadin.  She has a significant history of internal  hemorrhoids.  Her Coumadin is being been held because of this heme-  positive stool.  She denies any rectal bleeding.   FINDINGS:  Diffuse erythema of the antrum without erosion or ulcers.  Biopsies obtained via cold forceps to evaluate for H. pylori infection.  Otherwise, normal esophagus without evidence of Barrett's.  Normal  duodenal bulb and second portion of the duodenum.   RECOMMENDATIONS:  1. Unless she is having frank GI bleeding, I believe the benefits of      Coumadin therapy outweigh the risks of therpay if anticoagulation      is needed. I suspect the antral gastritis or her hemorrhoids are      the likely source of the heme positive stool.  Resume previous diet but avoid gastric irritants.  She is given a  handout on gastroesophageal reflux disease and gastric irritants.  1. I will call her with the results were biopsies. She should begin      PPI daily.  2. She has a follow-up appointment to see me in six weeks.  3. She is asked to avoid anti-inflammatory drugs and aspirin for seven      days.  4. She is asked to schedule a follow-up appointment with Dr. Hilma Favors      or Enid Skeens to discuss the need for continued anticoagulation.   MEDICATIONS:  1. Demerol 100 mg IV.  2. Versed 5 mg IV.   Physical exam was performed.  Informed consent was obtained from the  patient after explaining the benefits, risks and alternatives to the  procedure.  The patient was connected to the monitor and placed in the  left lateral position.  Continuous oxygen was provided by nasal cannula  and IV medicine administered through an indwelling cannula.  After  administration of sedation, the patient's esophagus was intubated and  the scope was advanced under direct  visualization to the second portion of the duodenum.  The scope was  subsequently removed slowly by carefully examining the color, texture,  anatomy and integrity of the mucosa on the way out.  The patient was  recovered in the endoscopy suite and discharged home in satisfactory  condition.      Caro Hight, M.D.  Electronically Signed     SM/MEDQ  D:  08/25/2006  T:  08/25/2006  Job:  [id number]   cc:   Leonides Grills, M.D.  Fax: [phone]   Halford Chessman, M.D.  Fax: [phone]

## 2010-11-25 NOTE — Procedures (Signed)
NAME:  Yolanda, [name] NO.:  [id number]   MEDICAL RECORD NO.:  [id number]          PATIENT TYPE:  OUT   LOCATION:  RAD                           FACILITY:  [facility]   PHYSICIAN:  Leslye Peer, MD       DATE OF BIRTH:  Aug 17, 1955   DATE OF PROCEDURE:  06/01/2004  DATE OF DISCHARGE:                                  ECHOCARDIOGRAM   INDICATION:  Ms. Freeze is a 55 year old female with a past medical history  of hypertension who has had bronchitis for the past six weeks, has been  experiencing dyspnea and is referred for echocardiogram to evaluate LV  function.   TECHNICAL QUALITY:  Technical quality of the study is limited, secondary to  patient body habitus and poor acoustic windows, particularly from the apical  window.   The aorta is within normal limits at 2.9 cm.   The left atrium is mildly dilated at 4.9 cm.  No obvious clots or masses  were appreciated and the patient appeared to be in sinus rhythm during this  procedure.   The anterior ventricular septum and posterior wall are mildly thickened at  1.3 cm for each.   The aortic valve was not completely visualized, but appeared to be grossly  structurally normal with normal leaflet excursion.  No significant aortic  insufficiency was noted.   The mitral valve also appeared grossly structurally normal.  No mitral valve  prolapse is noted.  The apical views were suboptimal.  Therefore color flow  Doppler was not useful in assessing for mitral or tricuspid regurgitation.   Pulmonic valve was not well visualized.   Tricuspid valve was also not well visualized.   The left ventricle was normal in size with the LVIDD measured at 2.4 cm and  the LVISD measured at 3.3 cm.  Overall, left ventricular systolic function  was normal and no regional wall motion abnormalities were noted.  There was  no evidence for diastolic dysfunction.   The right sided structures were not well visualized.  There was a small  posterior pericardial effusion without evidence of hemodynamic compromise.   IMPRESSION:  1.  Mild left atrial enlargement.  2.  Mild concentric left ventricular hypertrophy.  3.  Grossly normal valvular function.  4.  Normal left ventricular size and systolic function without regional wall      motion abnormality noted.  5.  Small posterior pericardial effusion without evidence of hemodynamic      compromise.      AB/MEDQ  D:  06/01/2004  T:  06/01/2004  Job:  [id number]   cc:   Halford Chessman, M.D.  Fax: [phone]   Leslye Peer, MD  Fax: [phone]

## 2010-11-25 NOTE — Consult Note (Signed)
[name], BITTICK NO.:  [id number]   MEDICAL RECORD NO.:  [id number]          PATIENT TYPE:  AMB   LOCATION:  DAY                           FACILITY:  [facility]   PHYSICIAN:  Caro Hight, M.D.      DATE OF BIRTH:  [birth year]   DATE OF CONSULTATION:  DATE OF DISCHARGE:                                 CONSULTATION   REFERRING PHYSICIAN:  Leonides Grills, M.D.   CHIEF COMPLAINT:  Hemoccult positive stool.   HISTORY OF PRESENT ILLNESS:  Yolanda [name] is a 55 year old Caucasian  female.  She is referred through the courtesy of [facility] Knee, [facility], and  was found to have Hemoccult positive stool on exam.  She had been on  Coumadin for 8 months with history of pulmonary embolus.  This was  discontinued last week when she was found to be hemoccult positive.  She  tells me a couple of months ago she noticed a large amount of blood in  her stool.  She has also had almost daily heartburn and indigestion  along with a significant amount of nausea.  She tells me she has lost 10  pounds in the last month and attributes this to her loss of appetite  which she has had over the last 6 months.  She had a colonoscopy last  year by Dr. Arnoldo Morale which was normal except for internal hemorrhoids.  She complains of hard stools but is having a bowel movement once or  twice a day.  She has been on Protonix previously, is not currently on  PPI   PAST MEDICAL HISTORY:  1. Pulmonary embolus, was on Coumadin until discontinued last week.      Pulmonary embolus was on December 08, 2005.  2. Hypertension.  3. Renal lithiasis.  4. Depression.  5. Hypercholesterolemia.  6. Chronic bronchitis.  7. C-section x2.  8. Right breast benign lipoma.  9. Exploratory laparoscopy years ago.  10.Sleep apnea.   CURRENT MEDICATIONS:  1. Lisinopril 20 mg daily.  2. Bisoprolol/hydrochlorothiazide 0.5/6.25 mg daily.  3. Simvastatin 20 mg daily.  4. Meclizine 25 q.i.d.  5. Sertraline HCl 100 mg b.i.d.  6.  Xanax 1 mg b.i.d.  7. Mupirocin ointment.  8. Restasis 0.05 mg b.i.d.  9. Tylenol p.r.n.  10.CPAP nightly.   ALLERGIES:  SULFA.   FAMILY HISTORY:  No known family history of colorectal carcinoma or  other chronic GI problems.  Her mother is alive with history of  leukemia, diabetes mellitus, CVA.  Father is alive with history of CABG.  One daughter has fibromyalgia, renal lithiasis and migraines.  Two  brothers alive with history of depression, diabetes mellitus and renal  lithiasis.   SOCIAL HISTORY:  [name] is divorced.  She lives with her daughter.  She has two relatively healthy children.  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  She denies any  tobacco or drug use.  She consumes one or two alcoholic beverages a  year.   REVIEW OF SYSTEMS:  CONSTITUTIONAL:  Denies any fever or chills.  She  does complain  of fatigue. see HPI.  CARDIOVASCULAR:  Denies chest pain  or palpitations.  PULMONARY:  She has had nonproductive cough, some  shortness of breath on exertion, was recently diagnosed with sleep apnea  on CPAP.  GI:  See HPI.  Denies any dysphagia or odynophagia, denies any  history of diarrhea.   PHYSICAL EXAMINATION:  VITAL SIGNS:  Weight 268, height 62-1/2 inches,  temperature 97.9, blood pressure 152/84, pulse 78.  GENERAL:  Ms. Carlis Abbott is an morally obese Caucasian female who is alert,  oriented, pleasant and cooperative, in no acute distress.  HEENT:  Sclerae are clear. Nonicteric.  Conjunctivae are pink.  Oropharynx pink and moist without lesions.  NECK:  Supple without masses or thyromegaly.  HEART:  Regular rate and rhythm, normal S1 and S2 without murmurs, rubs,  gallops, or clicks.  LUNGS:  Clear to auscultation bilaterally.  ABDOMEN:  Positive bowel sounds x4.  No bruits auscultated.  Soft,  nontender and nondistended without palpable mass or hepatosplenomegaly.  No rebound tenderness or guarding.  Exam is limited given  the patient's  body habitus.  RECTAL:  No external lesions visualized, good sphincter tone, no  internal masses palpated, small amount of clear secretions were obtained  from the vault which were Hemoccult negative.  EXTREMITIES:  Trace lower extremity edema bilaterally.   LABORATORY DATA:  Laboratory studies from [facility] from July 12, 2006,  she had normal CMP except for BUN of 27.  She had a hemoglobin A1c of  6.5.  On May 28, 2006, she had a white blood cell count of 13.5,  hemoglobin 12,5, hematocrit 37.7, platelets 209.  She had a normal TSH.   IMPRESSION:  Yolanda [name] is a 55 year old obese Caucasian female who was  found to have hemoccult positive stool on exam without history of  anemia.  She was on Coumadin for 8 months with history of pulmonary  embolus, this was discontinued last week when she was found to be  hemoccult positive.  She does have symptoms of anorexia, unintentional  weight loss, daily heartburn, indigestion and reflux symptoms and I feel  we should rule out peptic ulcer disease in this nice lady.  It is  reassuring that she has had a colonoscopy last year by Dr. Arnoldo Morale and  she was found to have internal hemorrhoids which could also be the  culprit of her Hemoccult positive stool on exam.   PLAN:  1. EGD with Dr. Stann Mainland in the near future.  I have discussed the      procedure including risks and benefits to include, but not limited      to, infection, perforation, drug reaction.  She agrees to the plan      and consent obtained.  2. Begin Nexium 40 mg daily and I have given her two weeks of samples      as well as prescription for 30 and two      refills.  3. Further recommendations pending procedure.   We would like to thank Dr. Orson Ape for allowing [place] to participate in the  care of Ms. Aguinaga.      Les Pou, N.P.      Caro Hight, M.D.  Electronically Signed    KC/MEDQ  D:  08/22/2006  T:  08/22/2006  Job:  [id number]   cc:    Leonides Grills, M.D.  Fax: [phone]

## 2010-11-25 NOTE — Procedures (Signed)
Yolanda [name], Yolanda [name]            ACCOUNT NO.:  [id number]   MEDICAL RECORD NO.:  [id number]          PATIENT TYPE:  OUT   LOCATION:  RAD                           FACILITY:  [facility]   PHYSICIAN:  Edward L. Luan Pulling, M.D.DATE OF BIRTH:  [birth year]   DATE OF PROCEDURE:  06/09/2004  DATE OF DISCHARGE:                              PULMONARY FUNCTION TEST   RESULTS:  Spirometry is normal.     [place]   ELH/MEDQ  D:  06/09/2004  T:  06/10/2004  Job:  [id number]

## 2010-11-25 NOTE — Procedures (Signed)
NAMELIVIAN, VANDERBECK            ACCOUNT NO.:  [id number]   MEDICAL RECORD NO.:  [id number]          PATIENT TYPE:  INP   LOCATION:  A223                          FACILITY:  [facility]   PHYSICIAN:  Eden Lathe. Einar Gip, MD       DATE OF BIRTH:  06/29/1956   DATE OF PROCEDURE:  12/12/2005  DATE OF DISCHARGE:                                  ECHOCARDIOGRAM   ATTENDING PHYSICIAN:  Sherrilee Gilles. Fusco, MD   MEASUREMENTS:  The aortic root measures 2.8 cm.   The left atrium is 4.1 cm.   The septum is 1.4 cm.   The posterior wall 1.3 cm.   The LV diastolic dimension is 4.2 and the LV systolic dimension is 3.0.   Ejection fraction is estimated at 60%.   LEFT ATRIUM:  The left atrium shows mild left atrial enlargement.   LEFT VENTRICLE:  The left ventricle shows mild to moderate left ventricular  hypertrophy.  There is normal wall motion.   RIGHT ATRIUM:  The right atrium shows mild atrial enlargement.   RIGHT VENTRICLE:  The right ventricle is not well visualized, but appears to  be mildly dilated.   AORTIC VALVE:  The aortic valve is not well visualized, but grossly the  velocities appear to be normal.   MITRAL VALVE:  The mitral valve is grossly normal. There is no significant  mitral valve regurgitation.   TRICUSPID VALVE:  The tricuspid valve is grossly normal. No significant  tricuspid regurgitation is evident.   PULMONARY ARTERY:  The pulmonary artery is not well visualized.   PERICARDIUM:  The pericardium appears to be normal. Trivial to small  pericardial effusion cannot be completely excluded. There is anterior fat  pad noted.   FINDINGS:  1.  Poor echocardiogram window secondary to body habitus.  No apical      windows.  2.  Normal left ventricular systolic function, ejection fraction 60%. Wall      motion abnormality could not be      deciphered by this study.  3.  Right ventricle appears to be mildly dilated.  4.  Minimal amount of pericardial effusion versus anterior  fat pad. No      evidence of tamponade.      Eden Lathe. Einar Gip, MD  Electronically Signed     JRG/MEDQ  D:  12/12/2005  T:  12/12/2005  Job:  [id number]   cc:   Sherrilee Gilles. Gerarda Fraction, MD  Fax: [phone]

## 2010-11-25 NOTE — H&P (Signed)
Yolanda [name], Yolanda [name] NO.:  [id number]   MEDICAL RECORD NO.:  [id number]           PATIENT TYPE:   LOCATION:                                 FACILITY:   PHYSICIAN:  Jamesetta So, M.D.  DATE OF BIRTH:  [birth year]   DATE OF ADMISSION:  04/19/2004  DATE OF DISCHARGE:  LH                                HISTORY & PHYSICAL   CHIEF COMPLAINT:  Hematochezia.   HISTORY OF PRESENT ILLNESS:  The patient is a 55 year old white female who  presents with a several episode history of hematochezia.  She is turned the  toilet bowl red with blood.  She denies any constipation, diarrhea, weight  loss, abdominal pain, nausea or vomiting.  She has never had a colonoscopy.  There is no family history of colon carcinoma.   PAST MEDICAL HISTORY:  1.  Hypertension.  2.  Depression.   PAST SURGICAL HISTORY:  1.  C-sections.  2.  Breast biopsy.  3.  Kidney stone removal.   CURRENT MEDICATIONS:  1.  Prinivil 40 mg p.o. daily.  2.  Ziac 5 mg p.o. daily.  3.  Xanax 1 mg p.o. b.i.d.  4.  Paxil 20 mg p.o. daily.   ALLERGIES:  SULFA.   REVIEW OF SYSTEMS:  Noncontributory.   PHYSICAL EXAMINATION:  GENERAL APPEARANCE:  The patient is a well-developed,  well-nourished, white female in no acute distress.  VITAL SIGNS:  She is afebrile and vital signs are stable.  LUNGS:  Clear to auscultation with equal breath sounds bilaterally.  HEART:  Regular rate and rhythm without S3, S4 or murmurs.  ABDOMEN:  Soft, nontender and nondistended.  No hepatosplenomegaly or masses  are noted.  RECTAL:  Examination was deferred to the procedure.   IMPRESSION:  Hematochezia.   PLAN:  The patient is scheduled for a colonoscopy on April 19, 2004.  The  risks and benefits of the procedure, including bleeding and perforation,  were full explained to the patient, who gave informed consent.     Mark   MAJ/MEDQ  D:  04/14/2004  T:  04/14/2004  Job:  [id number]   cc:   Halford Chessman, M.D.  Fax:  [phone]

## 2011-04-14 LAB — COMPREHENSIVE METABOLIC PANEL
ALT: 37 U/L — ABNORMAL HIGH (ref 0–35)
AST: 29 U/L (ref 0–37)
CO2: 30 mEq/L (ref 19–32)
Calcium: 9.1 mg/dL (ref 8.4–10.5)
Chloride: 101 mEq/L (ref 96–112)
Creatinine, Ser: 0.63 mg/dL (ref 0.4–1.2)
GFR calc Af Amer: >60 mL/min (ref >60)
GFR calc non Af Amer: >60 mL/min (ref >60)
Glucose, Bld: 107 mg/dL — ABNORMAL HIGH (ref 70–99)
Total Bilirubin: 0.8 mg/dL (ref 0.3–1.2)

## 2011-04-14 LAB — CBC
Hemoglobin: 15.2 g/dL — ABNORMAL HIGH (ref 12.0–15.0)
MCHC: 34.4 g/dL (ref 30.0–36.0)
MCV: 92.4 fL (ref 78.0–100.0)
RBC: 4.77 MIL/uL (ref 3.87–5.11)
WBC: 8 10*3/uL (ref 4.0–10.5)

## 2011-04-14 LAB — URINALYSIS, ROUTINE W REFLEX MICROSCOPIC
Bilirubin Urine: NEGATIVE
Glucose, UA: NEGATIVE mg/dL
Ketones, ur: NEGATIVE mg/dL
Protein, ur: NEGATIVE mg/dL
pH: 5.5 (ref 5.0–8.0)

## 2011-04-14 LAB — PROTIME-INR: Prothrombin Time: 13 seconds (ref 11.6–15.2)

## 2011-12-07 ENCOUNTER — Ambulatory Visit (HOSPITAL_COMMUNITY)
Admission: RE | Admit: 2011-12-07 | Discharge: 2011-12-07 | Disposition: A | Discharge status: Home / Self Care | Payer: PRIVATE HEALTH INSURANCE | Source: Ambulatory Visit | Attending: Family Medicine | Admitting: Family Medicine

## 2011-12-07 ENCOUNTER — Other Ambulatory Visit (HOSPITAL_COMMUNITY): Payer: Self-pay | Admitting: Family Medicine

## 2011-12-07 DIAGNOSIS — E785 Hyperlipidemia, unspecified: Secondary | ICD-10-CM

## 2011-12-07 DIAGNOSIS — E119 Type 2 diabetes mellitus without complications: Secondary | ICD-10-CM

## 2011-12-07 DIAGNOSIS — I10 Essential (primary) hypertension: Secondary | ICD-10-CM

## 2011-12-16 ENCOUNTER — Emergency Department (HOSPITAL_COMMUNITY): Payer: PRIVATE HEALTH INSURANCE

## 2011-12-16 ENCOUNTER — Encounter (HOSPITAL_COMMUNITY): Payer: Self-pay | Admitting: Emergency Medicine

## 2011-12-16 ENCOUNTER — Emergency Department (HOSPITAL_COMMUNITY)
Admission: EM | Admit: 2011-12-16 | Discharge: 2011-12-16 | Disposition: A | Discharge status: Home / Self Care | Payer: PRIVATE HEALTH INSURANCE | Attending: Emergency Medicine | Admitting: Emergency Medicine

## 2011-12-16 DIAGNOSIS — G8929 Other chronic pain: Secondary | ICD-10-CM | POA: Insufficient documentation

## 2011-12-16 DIAGNOSIS — K589 Irritable bowel syndrome without diarrhea: Secondary | ICD-10-CM | POA: Insufficient documentation

## 2011-12-16 DIAGNOSIS — I10 Essential (primary) hypertension: Secondary | ICD-10-CM | POA: Insufficient documentation

## 2011-12-16 DIAGNOSIS — F3289 Other specified depressive episodes: Secondary | ICD-10-CM | POA: Insufficient documentation

## 2011-12-16 DIAGNOSIS — X58XXXA Exposure to other specified factors, initial encounter: Secondary | ICD-10-CM | POA: Insufficient documentation

## 2011-12-16 DIAGNOSIS — IMO0001 Reserved for inherently not codable concepts without codable children: Secondary | ICD-10-CM | POA: Insufficient documentation

## 2011-12-16 DIAGNOSIS — S92919A Unspecified fracture of unspecified toe(s), initial encounter for closed fracture: Secondary | ICD-10-CM | POA: Insufficient documentation

## 2011-12-16 DIAGNOSIS — F329 Major depressive disorder, single episode, unspecified: Secondary | ICD-10-CM | POA: Insufficient documentation

## 2011-12-16 DIAGNOSIS — S92502A Displaced unspecified fracture of left lesser toe(s), initial encounter for closed fracture: Secondary | ICD-10-CM

## 2011-12-16 HISTORY — DX: Major depressive disorder, single episode, unspecified: F32.9

## 2011-12-16 HISTORY — DX: Gastritis, unspecified, without bleeding: K29.70

## 2011-12-16 HISTORY — DX: Other chronic pain: R10.9

## 2011-12-16 HISTORY — DX: Irritable bowel syndrome, unspecified: K58.9

## 2011-12-16 HISTORY — DX: Other hemorrhoids: K64.8

## 2011-12-16 HISTORY — DX: Calculus of kidney: N20.0

## 2011-12-16 HISTORY — DX: Depression, unspecified: F32.A

## 2011-12-16 HISTORY — DX: Fibromyalgia: M79.7

## 2011-12-16 HISTORY — DX: Other chronic pain: G89.29

## 2011-12-16 HISTORY — DX: Pain in left foot: M79.672

## 2011-12-16 HISTORY — DX: Other pulmonary embolism without acute cor pulmonale: I26.99

## 2011-12-16 HISTORY — DX: Essential (primary) hypertension: I10

## 2011-12-16 MED ORDER — HYDROCODONE-ACETAMINOPHEN 5-325 MG PO TABS: ORAL_TABLET | ORAL | Status: AC

## 2011-12-16 MED ORDER — LIDOCAINE HCL (PF) 1 % IJ SOLN
INTRAMUSCULAR | Status: AC
  Filled 2011-12-16: qty 5

## 2011-12-16 MED ORDER — OXYCODONE-ACETAMINOPHEN 5-325 MG PO TABS
2.0000 | ORAL_TABLET | Freq: Once | ORAL | Status: AC
  Administered 2011-12-16: 2 via ORAL
  Filled 2011-12-16: qty 2

## 2011-12-16 NOTE — Discharge Instructions (Signed)
RESOURCE GUIDE  Chronic Pain Problems: Contact [facility]  [phone] Patients need to be referred by their primary care doctor.  Insufficient Money for Medicine: Contact [employer]:  call "211" or [place] [phone].  No Primary Care Doctor: - Call [facility]  [phone] - can help you locate a primary care doctor that  accepts your insurance, provides certain services, etc. - Physician Referral Service- [phone]  Agencies that provide inexpensive medical care: - [facility]  [facility]  [phone] - [facility]  [phone] - [facility]  [phone] - Planned Parenthood  [phone] - [facility]  [phone]  [place] Providers: - [facility]- [address]  [phone], Mon-Fri 9am-7pm, Sat 9am-1pm - [place], [address] [place]  [place], [address] [place]  [place]- [address]  [place], [address], [phone]  Only accepts [place] patients after they have their name  applied to their card  Self Pay (no insurance) in [place]: - Sickle Cell Patients: Dr Kevan Ny, [facility]  [place], [facility]- [place]  [facility]- [address], [facility]- see information above (Speak to [employer] if you do not have insurance)       -  [facility]- [place], [place],  [place], [place]  Dr Vista Lawman-  [address], [place], [facility]- [address] [phone]       -  [facility]- [address], [place], also [address] [phone]       -    [facility]- [address], [place], 1st & 3rd Saturday   every month, 10am-1pm  1) Find a Doctor and Pay Out of Pocket Although you won't have to find out who is covered by your insurance plan, it is a good idea to ask around and get recommendations. You will then need to call the office and see if the doctor you have chosen will accept you as a new patient and what types of options they offer for patients who are self-pay. Some doctors offer discounts or will set up payment plans for their patients who do not have insurance, but you will need to ask so you aren't surprised when you get to your appointment.  2) Contact Your Local Health Department Not all health departments have doctors that can see patients for sick visits, but many do, so it is worth a call to see if yours does. If you don't know where your local health department is, you can check in your phone book. The CDC also has a tool to help you locate your state's health department, and many state websites also have  listings of all of their local health departments.  3) Find a [facility] If your illness is not likely to be very severe or complicated, you may want to try a walk in clinic. These are popping up all over the country in pharmacies, drugstores, and shopping centers. They're usually staffed by nurse practitioners or physician assistants that have been trained to treat common illnesses and complaints. They're usually fairly quick and inexpensive. However, if you have serious medical issues or chronic medical problems, these are probably not your best option  STD Testing - [place], [facility], [address], [place], phone [phone] or [phone].  Monday - Friday, call for an appointment. - [place], [facility], [address], [place], phone [phone] or [phone].  Monday - Friday, call for an appointment.  Abuse/Neglect: - [place] [phone] - [place] [phone] (After Hours)  Emergency Shelter:  [facility] [phone]  Maternity Homes: - [facility] [place] [phone] - [place] [phone]  MRSA Hotline #:   315-270-8738  [facility] [place] Dept. [address]         [place] Phone:  [phone]                                  Phone:  [phone]                   Phone:  [phone]  [place], [place] in [place], [address]                                  [place] [phone] or [phone] (After Hours)   [place]  Substance Abuse Resources: - Alcohol and Drug Services  [phone] - [place] [phone] - [facility] [place] [phone] - Residential & Outpatient Substance Abuse Program  [phone]  Psychological Services: - [place]  [place]  [place], [phone] [place]. [address], [place], [place]: [phone] or [phone], [employer]  Dental Assistance  If unable to pay or uninsured, contact:  [facility] or [facility]. to become qualified for the adult dental  clinic.  Patients with Medicaid: [facility] [phone] [address], [place] [address], [place]  If unable  to pay, or uninsured, contact [facility] [phone]) or [place] [phone] in [place], [place] in [facility]) to become qualified for the adult dental clinic  Other [place]- [place], [place], [place], [address], [place], [place], 2nd and 4th Thursday of the month at 6:30am.  10 clients each day by appointment, can sometimes see walk-in patients if someone does not show for an appointment. [facility]- [address] [place] [address], [place], [address], [place], [place], [place], [address], [place] Department- [place] [phone]     Take the prescription as directed.  Apply moist heat or ice to the area(s) of discomfort, for 15 minutes at a time, several times per day for the next few days.  Do not fall asleep on a heating or ice pack.  Keep your toes "buddy taped" and use the hard soled post-op shoe to walk until you are seen in follow up.  Use the crutches as needed for comfort.  Call your regular Orthopedic doctor on Monday to schedule a follow up appointment this week.  Return to the Emergency Department immediately if worsening.

## 2011-12-16 NOTE — ED Provider Notes (Signed)
History     CSN: [id number]  Arrival date & time 12/16/11  1925   First MD Initiated Contact with Patient 12/16/11 1935      Chief Complaint  Patient presents with  . Foot Injury    HPI Pt was seen at 1930.  Per pt, c/o gradual onset and persistence of constant left foot "pain" that began PTA.  Pt states she got her foot "caught up in a carpet I was rolling up."  Denies any other injuries, no open wounds, no tingling/numbness in extremity, no focal motor weakness.     Ortho:  Dr. Luna Glasgow Past Medical History  Diagnosis Date  . Fibromyalgia   . Hypertension   . PE (pulmonary embolism)   . Depression   . Chronic pain in left foot   . Chronic abdominal pain   . IBS (irritable bowel syndrome)   . Gastritis   . Internal hemorrhoids   . Kidney stone     Past Surgical History  Procedure Date  . Cesarean section   . Breast lumpectomy     History  Substance Use Topics  . Smoking status: Never Smoker   . Smokeless tobacco: Not on file  . Alcohol Use: Yes     ocassionally      Review of Systems ROS: Statement: All systems negative except as marked or noted in the HPI; Constitutional: Negative for fever and chills. ; ; Eyes: Negative for eye pain, redness and discharge. ; ; ENMT: Negative for ear pain, hoarseness, nasal congestion, sinus pressure and sore throat. ; ; Cardiovascular: Negative for chest pain, palpitations, diaphoresis, dyspnea and peripheral edema. ; ; Respiratory: Negative for cough, wheezing and stridor. ; ; Gastrointestinal: Negative for nausea, vomiting, diarrhea, abdominal pain, blood in stool, hematemesis, jaundice and rectal bleeding. . ; ; Genitourinary: Negative for dysuria, flank pain and hematuria. ; ; Musculoskeletal: +left foot pain. Negative for back pain and neck pain. Negative for swelling.; ; Skin: Negative for pruritus, rash, abrasions, blisters, bruising and skin lesion.; ; Neuro: Negative for headache, lightheadedness and neck stiffness.  Negative for weakness, altered level of consciousness , altered mental status, extremity weakness, paresthesias, involuntary movement, seizure and syncope.      Allergies  Sulfonamide derivatives  Home Medications  No current outpatient prescriptions on file.  BP 114/74  Pulse 78  Temp(Src) 97.2 F (36.2 C) (Oral)  Resp 16  Ht 5' 2"  (1.575 m)  Wt 230 lb (104.327 kg)  BMI 42.07 kg/m2  SpO2 100%  Physical Exam 1935: Physical examination:  Nursing notes reviewed; Vital signs and O2 SAT reviewed;  Constitutional: Well developed, Well nourished, Well hydrated, In no acute distress; Head:  Normocephalic, atraumatic; Eyes: EOMI, PERRL, No scleral icterus; ENMT: Mouth and pharynx normal, Mucous membranes moist; Neck: Supple, Full range of motion, No lymphadenopathy; Cardiovascular: Regular rate and rhythm, No murmur, rub, or gallop; Respiratory: Breath sounds clear & equal bilaterally, No rales, rhonchi, wheezes, or rub, Normal respiratory effort/excursion; Chest: Nontender, Movement normal;; Extremities: Pulses normal, NT left knee and ankle.  NMS intact left foot, strong pedal pp, LE muscle compartments soft.  No left proximal fibular head tenderness.  +plantarflexion of left foot w/calf squeeze.  No palpable gap left Achilles's tendon.  +generalized TTP left dorsal foot, +generalized TTP entire 2nd toe with mild localized edema.  No gross deformity, no open wounds, no ecchymosis, no erythema, no foot/ankle/knee edema, No calf edema or asymmetry.; Neuro: AA&Ox3, Major CN grossly intact. Speech clear. No gross focal motor  or sensory deficits in extremities.; Skin: Color normal, Warm, Dry.   ED Course  NERVE BLOCK Date/Time: 12/16/2011 8:25 PM Performed by: Alfonzo Feller Authorized by: Alfonzo Feller Consent: Verbal consent obtained. Risks and benefits: risks, benefits and alternatives were discussed Consent given by: patient Patient understanding: patient states understanding of  the procedure being performed Patient consent: the patient's understanding of the procedure matches consent given Imaging studies: imaging studies available Required items: required blood products, implants, devices, and special equipment available Patient identity confirmed: verbally with patient, arm band, provided demographic data and hospital-assigned identification number Time out: Immediately prior to procedure a "time out" was called to verify the correct patient, procedure, equipment, support staff and site/side marked as required. Indications: pain relief, dislocation and fracture Nerve block body site: left 2nd toe. Laterality: left Patient sedated: no Preparation: Patient was prepped and draped in the usual sterile fashion. Patient position: supine Needle gauge: 25 G Location technique: anatomical landmarks Local anesthetic: lidocaine 1% without epinephrine Anesthetic total: 1.5 ml Outcome: pain improved Patient tolerance: Patient tolerated the procedure well with no immediate complications.      MDM  MDM Reviewed: nursing note, vitals and previous chart Interpretation: x-ray     Dg Foot Complete Left 12/16/2011  *RADIOLOGY REPORT*  Clinical Data: Second toe pain. Fall.  LEFT FOOT - COMPLETE 3+ VIEW  Comparison: None.  Findings: There is a fracture with slight lateral translation/dislocation at the PIP joint of the second toe with soft tissue swelling. A triangular fracture fragment from the proximal phalanx of the second toe laterally is displaced with the middle phalanx.  IMPRESSION: Fracture dislocation as described.  Original Report Authenticated By: Staci Righter, M.D.     8:41 PM:  Pain improved after digital block.  Toes buddy taped in alignment.  Wants to go home now.  Requesting crutches.  Dx testing d/w pt and family.  Questions answered.  Verb understanding, agreeable to d/c home with outpt f/u.      Alfonzo Feller, DO 12/19/11 0028

## 2011-12-16 NOTE — ED Notes (Signed)
Patient stated tripped on carpet and fell. Complaining of left foot pain.

## 2012-01-09 ENCOUNTER — Emergency Department (HOSPITAL_COMMUNITY): Payer: PRIVATE HEALTH INSURANCE

## 2012-01-09 ENCOUNTER — Emergency Department (HOSPITAL_COMMUNITY)
Admission: EM | Admit: 2012-01-09 | Discharge: 2012-01-09 | Disposition: A | Discharge status: Home / Self Care | Payer: PRIVATE HEALTH INSURANCE | Attending: Emergency Medicine | Admitting: Emergency Medicine

## 2012-01-09 ENCOUNTER — Encounter (HOSPITAL_COMMUNITY): Payer: Self-pay | Admitting: *Deleted

## 2012-01-09 DIAGNOSIS — F3289 Other specified depressive episodes: Secondary | ICD-10-CM | POA: Insufficient documentation

## 2012-01-09 DIAGNOSIS — F329 Major depressive disorder, single episode, unspecified: Secondary | ICD-10-CM | POA: Insufficient documentation

## 2012-01-09 DIAGNOSIS — Z79899 Other long term (current) drug therapy: Secondary | ICD-10-CM | POA: Insufficient documentation

## 2012-01-09 DIAGNOSIS — I10 Essential (primary) hypertension: Secondary | ICD-10-CM | POA: Insufficient documentation

## 2012-01-09 DIAGNOSIS — N200 Calculus of kidney: Secondary | ICD-10-CM | POA: Insufficient documentation

## 2012-01-09 DIAGNOSIS — K589 Irritable bowel syndrome without diarrhea: Secondary | ICD-10-CM | POA: Insufficient documentation

## 2012-01-09 DIAGNOSIS — G8929 Other chronic pain: Secondary | ICD-10-CM | POA: Insufficient documentation

## 2012-01-09 DIAGNOSIS — IMO0001 Reserved for inherently not codable concepts without codable children: Secondary | ICD-10-CM | POA: Insufficient documentation

## 2012-01-09 LAB — LIPASE, BLOOD: Lipase: 47 U/L (ref 11–59)

## 2012-01-09 LAB — COMPREHENSIVE METABOLIC PANEL
ALT: 22 U/L (ref 0–35)
AST: 23 U/L (ref 0–37)
Albumin: 3.1 g/dL — ABNORMAL LOW (ref 3.5–5.2)
Alkaline Phosphatase: 81 U/L (ref 39–117)
BUN: 14 mg/dL (ref 6–23)
Chloride: 101 mEq/L (ref 96–112)
Potassium: 3.7 mEq/L (ref 3.5–5.1)
Sodium: 139 mEq/L (ref 135–145)
Total Bilirubin: 0.2 mg/dL — ABNORMAL LOW (ref 0.3–1.2)
Total Protein: 7.2 g/dL (ref 6.0–8.3)

## 2012-01-09 LAB — URINALYSIS, ROUTINE W REFLEX MICROSCOPIC
Bilirubin Urine: NEGATIVE
Glucose, UA: NEGATIVE mg/dL
Hgb urine dipstick: NEGATIVE
Ketones, ur: NEGATIVE mg/dL
Nitrite: NEGATIVE
Specific Gravity, Urine: <1.005 — ABNORMAL LOW (ref 1.005–1.030)
pH: 6 (ref 5.0–8.0)

## 2012-01-09 LAB — CBC WITH DIFFERENTIAL/PLATELET
Basophils Absolute: 0 10*3/uL (ref 0.0–0.1)
Basophils Relative: 0 % (ref 0–1)
Eosinophils Absolute: 0.1 10*3/uL (ref 0.0–0.7)
Hemoglobin: 13.7 g/dL (ref 12.0–15.0)
MCH: 31.4 pg (ref 26.0–34.0)
MCHC: 33.8 g/dL (ref 30.0–36.0)
Monocytes Relative: 9 % (ref 3–12)
Neutro Abs: 4.7 10*3/uL (ref 1.7–7.7)
Neutrophils Relative %: 54 % (ref 43–77)
Platelets: 248 10*3/uL (ref 150–400)
RBC: 4.36 MIL/uL (ref 3.87–5.11)

## 2012-01-09 MED ORDER — OXYCODONE-ACETAMINOPHEN 5-325 MG PO TABS: 1.0000 | ORAL_TABLET | ORAL | Status: DC | PRN

## 2012-01-09 MED ORDER — PROMETHAZINE HCL 25 MG PO TABS: 25.0000 mg | ORAL_TABLET | Freq: Four times a day (QID) | ORAL | Status: DC | PRN

## 2012-01-09 MED ORDER — MORPHINE SULFATE 4 MG/ML IJ SOLN
4.0000 mg | Freq: Once | INTRAMUSCULAR | Status: AC
  Administered 2012-01-09: 4 mg via INTRAVENOUS
  Filled 2012-01-09: qty 1

## 2012-01-09 MED ORDER — IOHEXOL 300 MG/ML  SOLN
120.0000 mL | Freq: Once | INTRAMUSCULAR | Status: AC | PRN
  Administered 2012-01-09: 120 mL via INTRAVENOUS

## 2012-01-09 MED ORDER — TAMSULOSIN HCL 0.4 MG PO CAPS: 0.4000 mg | ORAL_CAPSULE | Freq: Every day | ORAL | Status: DC

## 2012-01-09 MED ORDER — ONDANSETRON 8 MG PO TBDP
8.0000 mg | ORAL_TABLET | Freq: Once | ORAL | Status: AC
  Administered 2012-01-09: 8 mg via ORAL
  Filled 2012-01-09: qty 1

## 2012-01-09 MED ORDER — HYDROMORPHONE HCL PF 1 MG/ML IJ SOLN
1.0000 mg | Freq: Once | INTRAMUSCULAR | Status: AC
  Administered 2012-01-09: 1 mg via INTRAVENOUS
  Filled 2012-01-09: qty 1

## 2012-01-09 NOTE — ED Notes (Signed)
Pt resting something for pain

## 2012-01-09 NOTE — ED Notes (Signed)
Pt sent from [facility] for evaluation of abdominal pain and black stools. Pt states that the pain started on December 29, 2011 and you went to [facility] on 01/02/12 and was put on antibiotics for UTI. Pt continued with worsening abdominal pain and had black BM on Friday. Pt has not had a BM since Friday. Took Miralax without relief. Pt also c/o nausea but no vomiting. Not sure if she has had a fever but has had chills off and on.

## 2012-01-09 NOTE — ED Provider Notes (Signed)
Medical screening examination/treatment/procedure(s) were performed by non-physician practitioner and as supervising physician I was immediately available for consultation/collaboration.   Delora Fuel, MD 40/[id number]/40 [phone]

## 2012-01-09 NOTE — ED Provider Notes (Signed)
History     CSN: [id number]  Arrival date & time 01/09/12  4656   First MD Initiated Contact with Patient 01/09/12 0802      Chief Complaint  Patient presents with  . Abdominal Pain    (Consider location/radiation/quality/duration/timing/severity/associated sxs/prior treatment) HPI Comments: Yolanda [name] presents with abdominal pain which is been present for the past 10 days.  Her pain started in her left flank and left upper abdomen and she also describes pressure in her suprapubic region making her bleed she was trying to pass a kidney stone.  She was seen by her PCP and placed on Cipro for UTI and will complete this antibiotic today.  Since her symptoms began she has had worsening intermittent sharp stabbing pain which is been in her left upper quadrant but sometimes radiates to her right upper cord strain and still persists in her left flank.  She is using hydrocodone for pain and has not had a bowel movement in 5 days.  Her last bowel movement was hard and very black.  She denies seeing blood in her stool and has had no diarrhea.  She has been nausea without vomiting and has maintained a good appetite.  She has had subjective fevers.  Pain is constant with severe episodes occurring every few hours which will last for several minutes.  Past medical history is significant for fibromyalgia, IBS and kidney stones.  Patient is a 57 y.o. female presenting with abdominal pain. The history is provided by the patient.  Abdominal Pain The primary symptoms of the illness include abdominal pain and nausea. The primary symptoms of the illness do not include fever, shortness of breath, vomiting, dysuria or vaginal discharge.  Additional symptoms associated with the illness include constipation.    Past Medical History  Diagnosis Date  . Fibromyalgia   . Hypertension   . PE (pulmonary embolism)   . Depression   . Chronic pain in left foot   . Chronic abdominal pain   . IBS (irritable bowel  syndrome)   . Gastritis   . Internal hemorrhoids   . Kidney stone     Past Surgical History  Procedure Date  . Cesarean section   . Breast lumpectomy     History reviewed. No pertinent family history.  History  Substance Use Topics  . Smoking status: Former Research scientist (life sciences)  . Smokeless tobacco: Not on file  . Alcohol Use: Yes     ocassionally    OB History    Grav Para Term Preterm Abortions TAB SAB Ect Mult Living                  Review of Systems  Constitutional: Negative for fever.  HENT: Negative for congestion, sore throat and neck pain.   Eyes: Negative.   Respiratory: Negative for chest tightness and shortness of breath.   Cardiovascular: Negative for chest pain.  Gastrointestinal: Positive for nausea, abdominal pain, constipation and abdominal distention. Negative for vomiting and rectal pain.  Genitourinary: Negative.  Negative for dysuria and vaginal discharge.  Musculoskeletal: Negative for joint swelling and arthralgias.  Skin: Negative.  Negative for rash and wound.  Neurological: Negative for dizziness, weakness, light-headedness, numbness and headaches.  Hematological: Negative.   Psychiatric/Behavioral: Negative.     Allergies  Sulfonamide derivatives  Home Medications   Current Outpatient Rx  Name Route Sig Dispense Refill  . ACETAMINOPHEN-CAFFEINE 500-65 MG PO TABS Oral Take 1 tablet by mouth every 8 (eight) hours as needed. Headache    .  ALPRAZOLAM 1 MG PO TABS Oral Take 1 mg by mouth 3 (three) times daily as needed. Anxiety    . BISOPROLOL-HYDROCHLOROTHIAZIDE 5-6.25 MG PO TABS Oral Take 1 tablet by mouth daily.    Marland Kitchen REFRESH OPTIVE OP Ophthalmic Apply 1 drop to eye daily as needed. Dry Eyes    . VITAMIN D 2000 UNITS PO CAPS Oral Take 1 capsule by mouth daily.    Marland Kitchen CIPROFLOXACIN HCL 500 MG PO TABS Oral Take 500 mg by mouth 2 (two) times daily.    Marland Kitchen CLOTRIMAZOLE-BETAMETHASONE 1-0.05 % EX CREA Topical Apply 1 application topically daily.    Marland Kitchen DOCUSATE  SODIUM 100 MG PO CAPS Oral Take 100 mg by mouth at bedtime.    . OMEGA-3 FATTY ACIDS 1000 MG PO CAPS Oral Take 1 g by mouth daily.    Marland Kitchen LEVOTHYROXINE SODIUM 50 MCG PO TABS Oral Take 50 mcg by mouth daily.    Marland Kitchen MAGNESIUM PO Oral Take 1 tablet by mouth daily.    Marland Kitchen MECLIZINE HCL 25 MG PO TABS Oral Take 25 mg by mouth 3 (three) times daily as needed. Dizziness    . METHYLPHENIDATE HCL ER 36 MG PO TBCR Oral Take 36 mg by mouth daily.    Marland Kitchen METRONIDAZOLE 0.75 % EX CREA Topical Apply 1 application topically 2 (two) times daily.    Marland Kitchen MILNACIPRAN HCL 100 MG PO TABS Oral Take 100 mg by mouth 2 (two) times daily.    Marland Kitchen POLYETHYLENE GLYCOL 3350 PO PACK Oral Take 17 g by mouth daily as needed. Constipation    . TELMISARTAN 40 MG PO TABS Oral Take 40 mg by mouth daily.    . TRAMADOL HCL 50 MG PO TABS Oral Take 50-100 mg by mouth 3 (three) times daily.    . OXYCODONE-ACETAMINOPHEN 5-325 MG PO TABS Oral Take 1 tablet by mouth every 4 (four) hours as needed for pain. 20 tablet 0  . PROMETHAZINE HCL 25 MG PO TABS Oral Take 1 tablet (25 mg total) by mouth every 6 (six) hours as needed for nausea. 12 tablet 0  . TAMSULOSIN HCL 0.4 MG PO CAPS Oral Take 1 capsule (0.4 mg total) by mouth daily after supper. 7 capsule 0    BP 138/74  Pulse 85  Temp 97.8 F (36.6 C) (Oral)  Resp 18  Ht 5' 4"  (1.626 m)  Wt 238 lb (107.956 kg)  BMI 40.85 kg/m2  SpO2 94%  Physical Exam  Nursing note and vitals reviewed. Constitutional: She appears well-developed and well-nourished.  HENT:  Head: Normocephalic and atraumatic.  Eyes: Conjunctivae are normal.  Neck: Normal range of motion.  Cardiovascular: Normal rate, regular rhythm, normal heart sounds and intact distal pulses.   Pulmonary/Chest: Effort normal and breath sounds normal. She has no wheezes.  Abdominal: Soft. Bowel sounds are normal. She exhibits distension. She exhibits no mass. There is no hepatosplenomegaly. There is tenderness. There is no rebound.        Patient has generalized mild abdominal tenderness but severe pain to palpation in her left upper quadrant.  She also has bilateral upper quadrant increased tympany to percussion.  CVA tenderness left.  Digital rectal exam was negative for fecal impaction, no tenderness.  She was Hemoccult negative.    Musculoskeletal: Normal range of motion.  Neurological: She is alert.  Skin: Skin is warm and dry.  Psychiatric: She has a normal mood and affect.    ED Course  Procedures (including critical care time)  Labs Reviewed  COMPREHENSIVE METABOLIC PANEL - Abnormal; Notable for the following:    Albumin 3.1 (*)     Total Bilirubin 0.2 (*)     All other components within normal limits  URINALYSIS, ROUTINE W REFLEX MICROSCOPIC - Abnormal; Notable for the following:    Specific Gravity, Urine <1.005 (*)     All other components within normal limits  CBC WITH DIFFERENTIAL  LIPASE, BLOOD   Ct Abdomen Pelvis W Contrast  01/09/2012  *RADIOLOGY REPORT*  Clinical Data: Abdominal pain  CT ABDOMEN AND PELVIS WITH CONTRAST  Technique:  Multidetector CT imaging of the abdomen and pelvis was performed following the standard protocol during bolus administration of intravenous contrast.  Contrast: 166m OMNIPAQUE IOHEXOL 300 MG/ML  SOLN  Comparison: CT abdomen 05/03/2009.  Findings: Lung bases are clear.  No pericardial fluid.  No focal hepatic lesion.  The gallbladder, pancreas, spleen, and adrenal glands are normal.  There is mild hydronephrosis and hydroureter on the left secondary to an obstructing calculus in the distal left ureter measuring 6 mm (image 79).  This distal ureteral calculus is approximately 1 cm from the vesicoureteral junction.  There is an additional 6 mm calculus in the mid-left kidney.  No evidence of right nephrolithiasis or ureteral lithiasis.  The stomach, small bowel, and colon are normal.  Abdominal aorta normal caliber.  No retroperitoneal periportal lymphadenopathy.  No free fluid the  pelvis.  The bladder and uterus are normal.  The bladder is normal.  There is a coarse calcification at the level of the urethra on the right measuring 7 mm (image 87).  This is similar to comparison exam. Review of  bone windows demonstrates no aggressive osseous lesions.  IMPRESSION:  1.  Obstructing distal left ureteral calculus with mild hydronephrosis and hydroureter. 2.  Left nephrolithiasis. 3.  Coarse calcification right lateral to the urethra is unchanged from prior.  Original Report Authenticated By: SSuzy Bouchard M.D.     1. Kidney stone on left side     Hemoccult negative, completed at beside.  MDM  Distal ureteral stone her CT scan which was reviewed in addition to labs which were reviewed.  Discussed with Dr. JGeraldo Pitterwho will plan to see the patient in his office in one week, caveat that if she develops worse pain or fever she needs to contact him sooner.  He also suggested Flomax in addition to pain medication.        JEvalee Jefferson PUtah07/02/13 1222

## 2012-01-18 NOTE — Patient Instructions (Addendum)
Your procedure is scheduled on:  01/23/2012  Report to [facility] at  7:30    AM.  Call this number if you have problems the morning of surgery: [phone]   Remember:   Do not drink or eat food:After Midnight.    Clear liquids include soda, tea, black coffee, apple or grape juice, broth.  Take these medicines the morning of surgery with A SIP OF WATER: Ziac, Levothyroxine, Micardis   Do not wear jewelry, make-up or nail polish.  Do not wear lotions, powders, or perfumes. You may wear deodorant.  Do not shave 48 hours prior to surgery.  Do not bring valuables to the hospital.  Contacts, dentures or bridgework may not be worn into surgery.  Leave suitcase in the car. After surgery it may be brought to your room.  For patients admitted to the hospital, checkout time is 11:00 AM the day of discharge.   Patients discharged the day of surgery will not be allowed to drive home.  Name and phone number of your driver:   Special Instructions: CHG Shower use special wash: 1/2 bottle night before surgery and 1/2 bottle morning of surgery.   Please read over the following fact sheets that you were given: Pain Booklet, MRSA  Surgical Site Infection Prevention, Anesthesia Post-op Instructions and Care and Recovery After Surgery     Cystoscopy (Bladder Exam) Care After Refer to this sheet in the next few weeks. These discharge instructions provide you with general information on caring for yourself after you leave the hospital. Your caregiver may also give you specific instructions. Your treatment has been planned according to the most current medical practices available, but unavoidable complications sometimes occur. If you have any problems or questions after discharge, please call your caregiver. AFTER THE PROCEDURE   There may be temporary bleeding and burning with urination.   Drink enough water and fluids to keep your urine clear or pale yellow.  FINDING OUT THE RESULTS OF YOUR TEST Not all  test results are available during your visit. If your test results are not back during the visit, make an appointment with your caregiver to find out the results. Do not assume everything is normal if you have not heard from your caregiver or the medical facility. It is important for you to follow up on all of your test results. SEEK IMMEDIATE MEDICAL CARE IF:   There is an increase in blood in the urine or you are passing clots.   There is difficulty passing urine.   You develop the chills.   You have an oral temperature above 102 F (38.9 C), not controlled by medicine.   Belly (abdominal) pain develops.  Document Released: 01/13/2005 Document Revised: 06/15/2011 Document Reviewed: 11/11/2007 [facility] Patient Information 2012 [place].

## 2012-01-19 ENCOUNTER — Encounter (HOSPITAL_COMMUNITY): Payer: Self-pay

## 2012-01-19 ENCOUNTER — Encounter (HOSPITAL_COMMUNITY)
Admission: RE | Admit: 2012-01-19 | Discharge: 2012-01-19 | Disposition: A | Discharge status: Home / Self Care | Payer: PRIVATE HEALTH INSURANCE | Source: Ambulatory Visit

## 2012-01-19 ENCOUNTER — Emergency Department (HOSPITAL_COMMUNITY)
Admission: EM | Admit: 2012-01-19 | Discharge: 2012-01-19 | Disposition: A | Discharge status: Home / Self Care | Payer: PRIVATE HEALTH INSURANCE | Attending: Emergency Medicine | Admitting: Emergency Medicine

## 2012-01-19 DIAGNOSIS — I10 Essential (primary) hypertension: Secondary | ICD-10-CM | POA: Insufficient documentation

## 2012-01-19 DIAGNOSIS — Z87891 Personal history of nicotine dependence: Secondary | ICD-10-CM | POA: Insufficient documentation

## 2012-01-19 DIAGNOSIS — N200 Calculus of kidney: Secondary | ICD-10-CM | POA: Insufficient documentation

## 2012-01-19 DIAGNOSIS — R109 Unspecified abdominal pain: Secondary | ICD-10-CM

## 2012-01-19 DIAGNOSIS — Z86711 Personal history of pulmonary embolism: Secondary | ICD-10-CM | POA: Insufficient documentation

## 2012-01-19 MED ORDER — SODIUM CHLORIDE 0.9 % IV SOLN
Freq: Once | INTRAVENOUS | Status: AC
  Administered 2012-01-19: 11:00:00 via INTRAVENOUS

## 2012-01-19 MED ORDER — HYDROMORPHONE HCL PF 1 MG/ML IJ SOLN
1.0000 mg | Freq: Once | INTRAMUSCULAR | Status: AC
  Administered 2012-01-19: 1 mg via INTRAVENOUS
  Filled 2012-01-19: qty 1

## 2012-01-19 MED ORDER — OXYCODONE-ACETAMINOPHEN 5-325 MG PO TABS: 1.0000 | ORAL_TABLET | ORAL | Status: AC | PRN

## 2012-01-19 MED ORDER — METOCLOPRAMIDE HCL 5 MG/ML IJ SOLN
10.0000 mg | Freq: Once | INTRAMUSCULAR | Status: AC
  Administered 2012-01-19: 10 mg via INTRAVENOUS
  Filled 2012-01-19: qty 2

## 2012-01-19 MED ORDER — ONDANSETRON HCL 4 MG/2ML IJ SOLN
4.0000 mg | Freq: Once | INTRAMUSCULAR | Status: AC
  Administered 2012-01-19: 4 mg via INTRAVENOUS
  Filled 2012-01-19: qty 2

## 2012-01-19 MED ORDER — PROMETHAZINE HCL 25 MG PO TABS: 25.0000 mg | ORAL_TABLET | Freq: Four times a day (QID) | ORAL | Status: DC | PRN

## 2012-01-19 NOTE — ED Notes (Signed)
Pt reports was in short stay this morning filling out paperwork for kidney stone removal procedure scheduled for next Tuesday.  Pt says has been having severe pain in left flank since 9am this morning.  Also reports nausea.

## 2012-01-19 NOTE — ED Provider Notes (Signed)
History     CSN: [id number]  Arrival date & time 01/19/12  1003   First MD Initiated Contact with Patient 01/19/12 1023      Chief Complaint  Patient presents with  . Flank Pain    (Consider location/radiation/quality/duration/timing/severity/associated sxs/prior treatment) HPI Comments: Patient with a known hx of a left kidney stone c/o sudden onset of increasing pain that began earlier today.  States she was here in the short stay area earlier today filling out paperwork to have the basket retrieval procedure done on July 16,2013 when the pain became severe.  Feels similar to previous pain she experienced when she was first diagnosed.  She also c/o nausea but denies vomiting, abd pain or fever,  Also denies urinary sx's.  The history is provided by the patient.    Past Medical History  Diagnosis Date  . Fibromyalgia   . Hypertension   . PE (pulmonary embolism)   . Depression   . Chronic pain in left foot   . Chronic abdominal pain   . IBS (irritable bowel syndrome)   . Gastritis   . Internal hemorrhoids   . Kidney stone     Past Surgical History  Procedure Date  . Cesarean section   . Breast lumpectomy   . Dilation and curettage of uterus   . Abdominal surgery     No family history on file.  History  Substance Use Topics  . Smoking status: Former Research scientist (life sciences)  . Smokeless tobacco: Not on file  . Alcohol Use: Yes     ocassionally    OB History    Grav Para Term Preterm Abortions TAB SAB Ect Mult Living                  Review of Systems  Constitutional: Negative for fever, chills and fatigue.  HENT: Negative for sore throat, trouble swallowing, neck pain and neck stiffness.   Respiratory: Negative for cough, shortness of breath and wheezing.   Cardiovascular: Negative for chest pain and palpitations.  Gastrointestinal: Positive for abdominal pain. Negative for nausea, vomiting and blood in stool.       Left flank pain  Genitourinary: Negative for dysuria,  frequency, hematuria, flank pain and difficulty urinating.  Musculoskeletal: Positive for back pain. Negative for myalgias, arthralgias and gait problem.  Skin: Negative for color change and rash.  Neurological: Negative for dizziness, weakness and numbness.  Hematological: Does not bruise/bleed easily.  All other systems reviewed and are negative.    Allergies  Sulfonamide derivatives  Home Medications   Current Outpatient Rx  Name Route Sig Dispense Refill  . ALPRAZOLAM 1 MG PO TABS Oral Take 1 mg by mouth 3 (three) times daily as needed. Anxiety    . BISOPROLOL-HYDROCHLOROTHIAZIDE 5-6.25 MG PO TABS Oral Take 1 tablet by mouth daily.    Marland Kitchen VITAMIN D 2000 UNITS PO CAPS Oral Take 1 capsule by mouth daily.    Marland Kitchen CLOTRIMAZOLE-BETAMETHASONE 1-0.05 % EX CREA Topical Apply 1 application topically daily.    Marland Kitchen DOCUSATE SODIUM 100 MG PO CAPS Oral Take 100 mg by mouth at bedtime.    . OMEGA-3 FATTY ACIDS 1000 MG PO CAPS Oral Take 1 g by mouth daily.    Marland Kitchen LEVOTHYROXINE SODIUM 50 MCG PO TABS Oral Take 50 mcg by mouth daily.    Marland Kitchen MAGNESIUM PO Oral Take 1 tablet by mouth daily.    Marland Kitchen MECLIZINE HCL 25 MG PO TABS Oral Take 25 mg by mouth 3 (three) times  daily as needed. Dizziness    . METHYLPHENIDATE HCL ER 36 MG PO TBCR Oral Take 36 mg by mouth daily.    Marland Kitchen METRONIDAZOLE 0.75 % EX CREA Topical Apply 1 application topically 2 (two) times daily.    Marland Kitchen MILNACIPRAN HCL 100 MG PO TABS Oral Take 100 mg by mouth 2 (two) times daily.    . OXYCODONE-ACETAMINOPHEN 5-325 MG PO TABS Oral Take 1 tablet by mouth every 4 (four) hours as needed. For pain    . POLYETHYLENE GLYCOL 3350 PO PACK Oral Take 17 g by mouth daily as needed. Constipation    . TAMSULOSIN HCL 0.4 MG PO CAPS Oral Take 0.4 mg by mouth daily after supper.    . TELMISARTAN 40 MG PO TABS Oral Take 40 mg by mouth daily.    . TRAMADOL HCL 50 MG PO TABS Oral Take 50-100 mg by mouth 3 (three) times daily.    Marland Kitchen PROMETHAZINE HCL 25 MG PO TABS Oral Take 25  mg by mouth every 6 (six) hours as needed. For nausea      BP 99/57  Pulse 66  Temp 98.4 F (36.9 C) (Oral)  Resp 22  Ht 5' 4"  (1.626 m)  Wt 238 lb (107.956 kg)  BMI 40.85 kg/m2  SpO2 91%  Physical Exam  Nursing note and vitals reviewed. Constitutional: She is oriented to person, place, and time. She appears well-developed and well-nourished. No distress.  HENT:  Head: Normocephalic and atraumatic.  Mouth/Throat: Oropharynx is clear and moist.  Cardiovascular: Normal rate, regular rhythm, normal heart sounds and intact distal pulses.   No murmur heard. Pulmonary/Chest: Effort normal and breath sounds normal.  Abdominal: Soft. Bowel sounds are normal. She exhibits no distension and no mass. There is tenderness. There is no rebound and no guarding.       Patient points to left flank area as location of pain.  No tenderness with palpation.  No spinal tenderness.  Musculoskeletal: She exhibits no edema and no tenderness.  Neurological: She is alert and oriented to person, place, and time.  Skin: Skin is warm and dry.    ED Course  Procedures (including critical care time)  Labs Reviewed - No data to display      MDM     ED chart, nursing notes, and imaging from 7-13 visit was reviewed by me.  Shows a 65m distal stone at the vesicoureteral junction and an additional 667mstone in the mid kidney.   Patient has paperwork for a scheduled basket retrieval of a left sided kidney stone.  Procedure is scheduled for 01/23/2012 to be performed by Dr. JaMichela Pitcher Patient has history of previous kidney stones as well.   Patient is feeling better at this time and requesting to go home.  Appears stable for d/c.    I will prescribe Phenergan and Percocet for pain.   Yolanda [name] L. TrArmstrongPAUtah7/15/13 1712

## 2012-01-23 ENCOUNTER — Ambulatory Visit (HOSPITAL_COMMUNITY): Payer: PRIVATE HEALTH INSURANCE | Admitting: Anesthesiology

## 2012-01-23 ENCOUNTER — Ambulatory Visit (HOSPITAL_COMMUNITY): Payer: PRIVATE HEALTH INSURANCE

## 2012-01-23 ENCOUNTER — Ambulatory Visit (HOSPITAL_COMMUNITY)
Admission: RE | Admit: 2012-01-23 | Discharge: 2012-01-23 | Disposition: A | Discharge status: Home / Self Care | Payer: PRIVATE HEALTH INSURANCE | Source: Ambulatory Visit | Attending: Urology | Admitting: Urology

## 2012-01-23 ENCOUNTER — Encounter (HOSPITAL_COMMUNITY): Payer: Self-pay | Admitting: Anesthesiology

## 2012-01-23 ENCOUNTER — Encounter (HOSPITAL_COMMUNITY): Payer: Self-pay | Admitting: *Deleted

## 2012-01-23 ENCOUNTER — Encounter (HOSPITAL_COMMUNITY)
Admission: RE | Disposition: A | Discharge status: Home / Self Care | Payer: Self-pay | Source: Ambulatory Visit | Attending: Urology

## 2012-01-23 DIAGNOSIS — G4733 Obstructive sleep apnea (adult) (pediatric): Secondary | ICD-10-CM | POA: Insufficient documentation

## 2012-01-23 DIAGNOSIS — N210 Calculus in bladder: Secondary | ICD-10-CM | POA: Insufficient documentation

## 2012-01-23 DIAGNOSIS — I10 Essential (primary) hypertension: Secondary | ICD-10-CM | POA: Insufficient documentation

## 2012-01-23 DIAGNOSIS — Z79899 Other long term (current) drug therapy: Secondary | ICD-10-CM | POA: Insufficient documentation

## 2012-01-23 DIAGNOSIS — Z01812 Encounter for preprocedural laboratory examination: Secondary | ICD-10-CM | POA: Insufficient documentation

## 2012-01-23 DIAGNOSIS — Z01810 Encounter for preprocedural cardiovascular examination: Secondary | ICD-10-CM | POA: Insufficient documentation

## 2012-01-23 HISTORY — PX: CYSTOSCOPY W/ RETROGRADES: SHX1426

## 2012-01-23 HISTORY — PX: REMOVAL OF STONES: SHX5545

## 2012-01-23 SURGERY — CYSTOSCOPY, WITH RETROGRADE PYELOGRAM
Anesthesia: General | Site: Ureter | Wound class: Clean Contaminated

## 2012-01-23 MED ORDER — FENTANYL CITRATE 0.05 MG/ML IJ SOLN
INTRAMUSCULAR | Status: DC | PRN
  Administered 2012-01-23 (×2): 50 ug via INTRAVENOUS

## 2012-01-23 MED ORDER — ONDANSETRON HCL 4 MG/2ML IJ SOLN: 4.0000 mg | Freq: Once | INTRAMUSCULAR | Status: AC | PRN

## 2012-01-23 MED ORDER — FENTANYL CITRATE 0.05 MG/ML IJ SOLN: 25.0000 ug | INTRAMUSCULAR | Status: DC | PRN

## 2012-01-23 MED ORDER — ROCURONIUM BROMIDE 50 MG/5ML IV SOLN
INTRAVENOUS | Status: AC
  Filled 2012-01-23: qty 1

## 2012-01-23 MED ORDER — GLYCOPYRROLATE 0.2 MG/ML IJ SOLN
INTRAMUSCULAR | Status: AC
  Filled 2012-01-23: qty 1

## 2012-01-23 MED ORDER — SCOPOLAMINE 1 MG/3DAYS TD PT72
1.0000 | MEDICATED_PATCH | Freq: Once | TRANSDERMAL | Status: DC
  Administered 2012-01-23: 1.5 mg via TRANSDERMAL

## 2012-01-23 MED ORDER — MIDAZOLAM HCL 2 MG/2ML IJ SOLN
INTRAMUSCULAR | Status: AC
  Filled 2012-01-23: qty 2

## 2012-01-23 MED ORDER — MIDAZOLAM HCL 2 MG/2ML IJ SOLN
1.0000 mg | INTRAMUSCULAR | Status: DC | PRN
  Administered 2012-01-23 (×2): 2 mg via INTRAVENOUS

## 2012-01-23 MED ORDER — PROPOFOL 10 MG/ML IV EMUL
INTRAVENOUS | Status: DC | PRN
  Administered 2012-01-23: 150 mg via INTRAVENOUS
  Administered 2012-01-23: 30 mg via INTRAVENOUS

## 2012-01-23 MED ORDER — LIDOCAINE HCL (CARDIAC) 10 MG/ML IV SOLN
INTRAVENOUS | Status: DC | PRN
  Administered 2012-01-23: 10 mg via INTRAVENOUS

## 2012-01-23 MED ORDER — LIDOCAINE HCL (PF) 1 % IJ SOLN
INTRAMUSCULAR | Status: AC
  Filled 2012-01-23: qty 5

## 2012-01-23 MED ORDER — FENTANYL CITRATE 0.05 MG/ML IJ SOLN
INTRAMUSCULAR | Status: AC
  Filled 2012-01-23: qty 2

## 2012-01-23 MED ORDER — IOHEXOL 350 MG/ML SOLN
INTRAVENOUS | Status: DC | PRN
  Administered 2012-01-23: 50 mL via INTRAVENOUS

## 2012-01-23 MED ORDER — NEOSTIGMINE METHYLSULFATE 1 MG/ML IJ SOLN
INTRAMUSCULAR | Status: DC | PRN
  Administered 2012-01-23 (×2): 1 mg via INTRAVENOUS
  Administered 2012-01-23: 2 mg via INTRAVENOUS
  Administered 2012-01-23: 1 mg via INTRAVENOUS

## 2012-01-23 MED ORDER — ONDANSETRON HCL 4 MG/2ML IJ SOLN
INTRAMUSCULAR | Status: AC
  Administered 2012-01-23: 4 mg via INTRAVENOUS
  Filled 2012-01-23: qty 2

## 2012-01-23 MED ORDER — LACTATED RINGERS IV SOLN
INTRAVENOUS | Status: DC
  Administered 2012-01-23: 1000 mL via INTRAVENOUS

## 2012-01-23 MED ORDER — MIDAZOLAM HCL 2 MG/2ML IJ SOLN
INTRAMUSCULAR | Status: AC
  Administered 2012-01-23: 2 mg via INTRAVENOUS
  Filled 2012-01-23: qty 2

## 2012-01-23 MED ORDER — SUCCINYLCHOLINE CHLORIDE 20 MG/ML IJ SOLN
INTRAMUSCULAR | Status: DC | PRN
  Administered 2012-01-23: 140 mg via INTRAVENOUS

## 2012-01-23 MED ORDER — DEXAMETHASONE SODIUM PHOSPHATE 4 MG/ML IJ SOLN
4.0000 mg | Freq: Once | INTRAMUSCULAR | Status: AC
  Administered 2012-01-23: 4 mg via INTRAVENOUS

## 2012-01-23 MED ORDER — DEXAMETHASONE SODIUM PHOSPHATE 4 MG/ML IJ SOLN
INTRAMUSCULAR | Status: AC
  Administered 2012-01-23: 4 mg via INTRAVENOUS
  Filled 2012-01-23: qty 1

## 2012-01-23 MED ORDER — ROCURONIUM BROMIDE 100 MG/10ML IV SOLN
INTRAVENOUS | Status: DC | PRN
  Administered 2012-01-23: 10 mg via INTRAVENOUS
  Administered 2012-01-23: 5 mg via INTRAVENOUS
  Administered 2012-01-23: 20 mg via INTRAVENOUS

## 2012-01-23 MED ORDER — STERILE WATER FOR IRRIGATION IR SOLN
Status: DC | PRN
  Administered 2012-01-23: 1000 mL

## 2012-01-23 MED ORDER — PROPOFOL 10 MG/ML IV EMUL
INTRAVENOUS | Status: AC
  Filled 2012-01-23: qty 20

## 2012-01-23 MED ORDER — GLYCOPYRROLATE 0.2 MG/ML IJ SOLN
INTRAMUSCULAR | Status: DC | PRN
  Administered 2012-01-23: 0.2 mg via INTRAVENOUS
  Administered 2012-01-23: 0.4 mg via INTRAVENOUS
  Administered 2012-01-23 (×2): 0.2 mg via INTRAVENOUS

## 2012-01-23 MED ORDER — ONDANSETRON HCL 4 MG/2ML IJ SOLN
4.0000 mg | Freq: Once | INTRAMUSCULAR | Status: AC
  Administered 2012-01-23: 4 mg via INTRAVENOUS

## 2012-01-23 MED ORDER — SCOPOLAMINE 1 MG/3DAYS TD PT72
MEDICATED_PATCH | TRANSDERMAL | Status: AC
  Filled 2012-01-23: qty 1

## 2012-01-23 MED ORDER — SUCCINYLCHOLINE CHLORIDE 20 MG/ML IJ SOLN
INTRAMUSCULAR | Status: AC
  Filled 2012-01-23: qty 1

## 2012-01-23 SURGICAL SUPPLY — 23 items
BAG DRAIN URO TABLE W/ADPT NS (DRAPE) ×4 IMPLANT
CATH 5 FR WEDGE TIP (UROLOGICAL SUPPLIES) ×4 IMPLANT
CATH OPEN TIP 5FR (CATHETERS) ×4 IMPLANT
CLOTH BEACON ORANGE TIMEOUT ST (SAFETY) ×4 IMPLANT
DILATOR UROMAX ULTRA (MISCELLANEOUS) IMPLANT
GLOVE BIO SURGEON STRL SZ7 (GLOVE) ×4 IMPLANT
GLOVE BIOGEL PI IND STRL 7.0 (GLOVE) ×3 IMPLANT
GLOVE BIOGEL PI INDICATOR 7.0 (GLOVE) ×1
GLOVE EXAM NITRILE MD LF STRL (GLOVE) ×4 IMPLANT
GLOVE SS BIOGEL STRL SZ 6.5 (GLOVE) ×3 IMPLANT
GLOVE SUPERSENSE BIOGEL SZ 6.5 (GLOVE) ×1
GOWN STRL REIN XL XLG (GOWN DISPOSABLE) ×4 IMPLANT
IV NS IRRIG 3000ML ARTHROMATIC (IV SOLUTION) ×8 IMPLANT
KIT ROOM TURNOVER AP CYSTO (KITS) ×4 IMPLANT
LASER FIBER DISP (UROLOGICAL SUPPLIES) IMPLANT
LASER FIBER DISP 1000U (UROLOGICAL SUPPLIES) IMPLANT
MANIFOLD NEPTUNE II (INSTRUMENTS) ×4 IMPLANT
PACK CYSTO (CUSTOM PROCEDURE TRAY) ×4 IMPLANT
PAD ARMBOARD 7.5X6 YLW CONV (MISCELLANEOUS) ×4 IMPLANT
STENT PERCUFLEX 4.8FRX24 (STENTS) IMPLANT
STONE RETRIEVAL GEMINI 2.4 FR (MISCELLANEOUS) IMPLANT
TOWEL OR 17X26 4PK STRL BLUE (TOWEL DISPOSABLE) ×4 IMPLANT
WIRE GUIDE BENTSON .035 15CM (WIRE) ×4 IMPLANT

## 2012-01-23 NOTE — Anesthesia Postprocedure Evaluation (Signed)
Anesthesia Post Note  Patient: Yolanda [name]  Procedure(s) Performed: Procedure(s) (LRB): CYSTOSCOPY WITH RETROGRADE PYELOGRAM (Left) REMOVAL OF STONES (N/A)  Anesthesia type: General  Patient location: PACU  Post pain: Pain level controlled  Post assessment: Post-op Vital signs reviewed, Patient's Cardiovascular Status Stable, Respiratory Function Stable, Patent Airway, No signs of Nausea or vomiting and Pain level controlled  Last Vitals:  Filed Vitals:   01/23/12 1008  BP: 116/79  Pulse: 77  Temp: 36.6 C  Resp: 16    Post vital signs: Reviewed and stable  Level of consciousness: awake and alert   Complications: No apparent anesthesia complications

## 2012-01-23 NOTE — Progress Notes (Signed)
No change in H&P on reexamination.

## 2012-01-23 NOTE — ED Provider Notes (Signed)
Medical screening examination/treatment/procedure(s) were performed by non-physician practitioner and as supervising physician I was immediately available for consultation/collaboration.   Mylinda Latina III, MD 01/23/12 [phone]

## 2012-01-23 NOTE — Progress Notes (Signed)
Pt able to empty bladder prior to discharge.

## 2012-01-23 NOTE — Brief Op Note (Signed)
01/23/2012  9:29 AM  PATIENT:  Yolanda [name]  56 y.o. female  PRE-OPERATIVE DIAGNOSIS:  left ureteral calculus  POST-OPERATIVE DIAGNOSIS:  * No post-op diagnosis entered *  PROCEDURE:  Procedure(s) (LRB): CYSTOSCOPY/RETROGRADE/URETEROSCOPY/STONE EXTRACTION WITH BASKET (Left) HOLMIUM LASER APPLICATION (Left)  SURGEON:  Surgeon(s) and Role:    * Marissa Nestle, MD - Primary  PHYSICIAN ASSISTANT:   ASSISTANTS: none   ANESTHESIA:   general  EBL:  Total I/O In: 400 [I.V.:400] Out: 0   BLOOD ADMINISTERED:none  DRAINS: none   LOCAL MEDICATIONS USED:  NONE  SPECIMEN:  Source of Specimen:  l ureteral calculus is in the bladdder and removed given  toher family to bring in office so we can send for analysis  DISPOSITION OF SPECIMEN:  N/A  COUNTS:  YES  TOURNIQUET:  * No tourniquets in log *  DICTATION: .Other Dictation: Dictation Number dictation [id number]  PLAN OF CARE: Discharge to home after PACU  PATIENT DISPOSITION:  PACU - hemodynamically stable.   Delay start of Pharmacological VTE agent (>24hrs) due to surgical blood loss or risk of bleeding:

## 2012-01-23 NOTE — Anesthesia Preprocedure Evaluation (Addendum)
Anesthesia Evaluation  Patient identified by MRN, date of birth, ID band Patient awake    Reviewed: Allergy & Precautions, H&P , NPO status , Patient's Chart, lab work & pertinent test results  History of Anesthesia Complications (+) PONV and POST - OP SPINAL HEADACHE  Airway Mallampati: II TM Distance: >3 FB Neck ROM: Full    Dental  (+) Teeth Intact   Pulmonary sleep apnea , PE breath sounds clear to auscultation        Cardiovascular hypertension, Pt. on medications Rhythm:Regular Rate:Normal     Neuro/Psych PSYCHIATRIC DISORDERS Depression    GI/Hepatic GERD-  Medicated and Controlled,  Endo/Other  Morbid obesity  Renal/GU      Musculoskeletal  (+) Fibromyalgia -  Abdominal   Peds  Hematology   Anesthesia Other Findings   Reproductive/Obstetrics                           Anesthesia Physical Anesthesia Plan  ASA: III  Anesthesia Plan: General   Post-op Pain Management:    Induction: Intravenous, Rapid sequence and Cricoid pressure planned  Airway Management Planned: Oral ETT  Additional Equipment:   Intra-op Plan:   Post-operative Plan: Extubation in OR  Informed Consent: I have reviewed the patients History and Physical, chart, labs and discussed the procedure including the risks, benefits and alternatives for the proposed anesthesia with the patient or authorized representative who has indicated his/her understanding and acceptance.     Plan Discussed with:   Anesthesia Plan Comments:         Anesthesia Quick Evaluation

## 2012-01-23 NOTE — Addendum Note (Signed)
Addendum  created 01/23/12 1015 by Vista Deck, CRNA   Modules edited:Anesthesia Medication Administration

## 2012-01-23 NOTE — Anesthesia Procedure Notes (Signed)
Procedure Name: Intubation Date/Time: 01/23/2012 9:06 AM Performed by: Vista Deck Pre-anesthesia Checklist: Patient identified, Patient being monitored, Timeout performed, Emergency Drugs available and Suction available Patient Re-evaluated:Patient Re-evaluated prior to inductionOxygen Delivery Method: Circle System Utilized Preoxygenation: Pre-oxygenation with 100% oxygen Intubation Type: IV induction, Rapid sequence and Cricoid Pressure applied Ventilation: Mask ventilation without difficulty Laryngoscope Size: Miller and 2 Grade View: Grade I Tube type: Oral Tube size: 7.0 mm Number of attempts: 1 Airway Equipment and Method: stylet Placement Confirmation: ETT inserted through vocal cords under direct vision,  positive ETCO2 and breath sounds checked- equal and bilateral Secured at: 21 cm Tube secured with: Tape Dental Injury: Teeth and Oropharynx as per pre-operative assessment

## 2012-01-23 NOTE — Transfer of Care (Signed)
Immediate Anesthesia Transfer of Care Note  Patient: Yolanda [name]  Procedure(s) Performed: Procedure(s) (LRB): CYSTOSCOPY WITH RETROGRADE PYELOGRAM (Left) REMOVAL OF STONES (N/A)  Patient Location: PACU  Anesthesia Type: General  Level of Consciousness: awake  Airway & Oxygen Therapy: Patient Spontanous Breathing and non-rebreather face mask  Post-op Assessment: Report given to PACU RN, Post -op Vital signs reviewed and stable and Patient moving all extremities  Post vital signs: Reviewed and stable  Complications: No apparent anesthesia complications

## 2012-01-23 NOTE — H&P (Signed)
Yolanda [name], SLAPPEY NO.:  [id number]  MEDICAL RECORD NO.:  [id number]  LOCATION:                                 FACILITY:  PHYSICIAN:  Marissa Nestle, M.D.DATE OF BIRTH:  [birth year]  DATE OF ADMISSION:  01/23/2012 DATE OF DISCHARGE:  LH                             HISTORY & PHYSICAL   CHIEF COMPLAINT:  Recurrent left renal colic.  HISTORY:  This patient who is 56 year old, that was seen by me in the office on January 17, 2012 with left renal colic.  She went to her family physician at [facility].  They sent her to have the CT scan.  CT scan was done, which showed that she has a 6-mm calculus in the distal left ureter causing partial obstruction.  She was having severe pain, so she was sent home with pain medicine.  When I seen her in the office, she was still hurting.  She wanted do something about this, so I told her we can do a stone basket.  The procedure of stone basket was discussed in detail.  Possible complication especially ureteral perforation leading to open surgery, use of double-J stent, and use of migration of stone.  No guarantees about the results.  She understands, wanted me to go ahead and schedule.  So, I have scheduled for January 23, 2012.  She will come as an outpatient, and under anesthesia we will do the holmium laser lithotripsy to try to remove the stone.  PAST MEDICAL HISTORY:  She has hypertension, takes medicine.  No diabetes.  History of having C-section in 1980 and 1982.  She also had a history of having kidney stones, were removed with the basketing in the past, and benign lumps removed from right breast.  PERSONAL HISTORY:  She does not smoke.  Drinks occasionally.  REVIEW OF SYSTEMS:  Unremarkable.  PHYSICAL EXAMINATION:  VITAL SIGNS:  Blood pressure 145/96, temperature 97.4. CENTRAL NERVOUS SYSTEM:  No gross neurological deficit. HEAD, NECK, EYES, ENT:  Negative. CHEST:  Symmetrical. HEART:  Regular sinus rhythm.   No murmur. ABDOMEN:  Soft, flat.  Liver, spleen, kidneys were not palpable.  1+ left CVA tenderness. PELVIC:  No adnexal mass or tenderness.  IMPRESSION:  Recurrent left renal colic, distal left ureteral calculus.  PLAN:  Cystoscopy, left retrograde pyelogram, ureteroscopic stone basket with holmium laser lithotripsy.  Use of double-J stent under anesthesia as an outpatient.  Procedure limitation and complication discussed.     Marissa Nestle, M.D.     MIJ/MEDQ  D:  01/22/2012  T:  01/23/2012  Job:  [id number]

## 2012-01-24 NOTE — Op Note (Signed)
NAME:  Yolanda [name], Yolanda [name] NO.:  [id number]  MEDICAL RECORD NO.:  [id number]  LOCATION:  [facility]                          FACILITY:  [facility]  PHYSICIAN:  Marissa Nestle, M.D.DATE OF BIRTH:  28-Mar-1956  DATE OF PROCEDURE: DATE OF DISCHARGE:                              OPERATIVE REPORT   PREOPERATIVE DIAGNOSIS:  Left ureteral calculus.  POSTOPERATIVE DIAGNOSIS:  Bladder stone.  PROCEDURE:  Cystoscopy, left retrograde pyelogram, and extraction of bladder stone.  ANESTHESIA:  General.  PROCEDURE IN DETAIL:  The patient under general anesthesia, in lithotomy position.  After usual prep and drape, #25 cystoscope introduced into the bladder.  Left ureteral orifice shows some bloody excoriation on it and the stone is sitting in the bladder.  I think she just passed that stone, but I went ahead and did a retrograde pyelogram and left ureteral orifice catheterized with a wedge catheter, Hypaque injected under fluoroscopic control.  It goes up into the upper ureter which was normal caliber.  There is no filling defect.  I decided not to do any ureteroscopy because retrograde pyelogram looks perfectly normal and the stone is sitting in the bladder.  I think it is the same stone.  With the help of a flexible biopsy forceps, the stone was extracted from the bladder.  All the instruments were removed.  The patient left the operating room in satisfactory condition.     Marissa Nestle, M.D.     MIJ/MEDQ  D:  01/23/2012  T:  01/24/2012  Job:  [id number]

## 2012-01-25 ENCOUNTER — Encounter (HOSPITAL_COMMUNITY): Payer: Self-pay | Admitting: Urology

## 2012-02-23 ENCOUNTER — Other Ambulatory Visit (HOSPITAL_COMMUNITY): Payer: Self-pay | Admitting: Family Medicine

## 2012-02-23 DIAGNOSIS — Z139 Encounter for screening, unspecified: Secondary | ICD-10-CM

## 2012-03-05 ENCOUNTER — Ambulatory Visit (HOSPITAL_COMMUNITY)
Admission: RE | Admit: 2012-03-05 | Discharge: 2012-03-05 | Disposition: A | Discharge status: Home / Self Care | Payer: PRIVATE HEALTH INSURANCE | Source: Ambulatory Visit | Attending: Family Medicine | Admitting: Family Medicine

## 2012-03-05 DIAGNOSIS — Z139 Encounter for screening, unspecified: Secondary | ICD-10-CM

## 2012-03-05 DIAGNOSIS — M899 Disorder of bone, unspecified: Secondary | ICD-10-CM | POA: Insufficient documentation

## 2012-03-05 DIAGNOSIS — Z13820 Encounter for screening for osteoporosis: Secondary | ICD-10-CM | POA: Insufficient documentation

## 2012-03-05 DIAGNOSIS — Z1231 Encounter for screening mammogram for malignant neoplasm of breast: Secondary | ICD-10-CM | POA: Insufficient documentation

## 2012-03-05 DIAGNOSIS — M949 Disorder of cartilage, unspecified: Secondary | ICD-10-CM | POA: Insufficient documentation

## 2012-08-07 ENCOUNTER — Ambulatory Visit (HOSPITAL_COMMUNITY): Payer: PRIVATE HEALTH INSURANCE | Admitting: Psychology

## 2012-08-20 ENCOUNTER — Ambulatory Visit (INDEPENDENT_AMBULATORY_CARE_PROVIDER_SITE_OTHER): Payer: PRIVATE HEALTH INSURANCE | Admitting: Psychology

## 2012-08-20 DIAGNOSIS — F4310 Post-traumatic stress disorder, unspecified: Secondary | ICD-10-CM

## 2012-08-20 DIAGNOSIS — F329 Major depressive disorder, single episode, unspecified: Secondary | ICD-10-CM

## 2012-08-20 DIAGNOSIS — F4312 Post-traumatic stress disorder, chronic: Secondary | ICD-10-CM

## 2012-08-21 ENCOUNTER — Encounter (HOSPITAL_COMMUNITY): Payer: Self-pay | Admitting: Psychology
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
## 2012-08-29 ENCOUNTER — Ambulatory Visit (HOSPITAL_COMMUNITY): Payer: Self-pay | Admitting: Psychology

## 2012-09-05 ENCOUNTER — Ambulatory Visit (INDEPENDENT_AMBULATORY_CARE_PROVIDER_SITE_OTHER): Payer: PRIVATE HEALTH INSURANCE | Admitting: Psychology

## 2012-09-09 ENCOUNTER — Encounter (HOSPITAL_COMMUNITY): Payer: Self-pay | Admitting: Psychology
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
## 2012-09-11 ENCOUNTER — Encounter (HOSPITAL_COMMUNITY): Payer: Self-pay | Admitting: *Deleted

## 2012-09-11 ENCOUNTER — Emergency Department (HOSPITAL_COMMUNITY): Payer: PRIVATE HEALTH INSURANCE

## 2012-09-11 ENCOUNTER — Observation Stay (HOSPITAL_COMMUNITY)
Admission: EM | Admit: 2012-09-11 | Discharge: 2012-09-15 | Disposition: A | Discharge status: Home / Self Care | Payer: PRIVATE HEALTH INSURANCE | Attending: Family Medicine | Admitting: Family Medicine

## 2012-09-11 DIAGNOSIS — E785 Hyperlipidemia, unspecified: Secondary | ICD-10-CM | POA: Insufficient documentation

## 2012-09-11 DIAGNOSIS — I10 Essential (primary) hypertension: Secondary | ICD-10-CM | POA: Diagnosis present

## 2012-09-11 DIAGNOSIS — F32A Depression, unspecified: Secondary | ICD-10-CM | POA: Diagnosis present

## 2012-09-11 DIAGNOSIS — E66813 Obesity, class 3: Secondary | ICD-10-CM | POA: Diagnosis present

## 2012-09-11 DIAGNOSIS — R072 Precordial pain: Principal | ICD-10-CM | POA: Diagnosis present

## 2012-09-11 DIAGNOSIS — E1169 Type 2 diabetes mellitus with other specified complication: Secondary | ICD-10-CM | POA: Diagnosis present

## 2012-09-11 DIAGNOSIS — R1013 Epigastric pain: Secondary | ICD-10-CM | POA: Insufficient documentation

## 2012-09-11 DIAGNOSIS — R1011 Right upper quadrant pain: Secondary | ICD-10-CM | POA: Insufficient documentation

## 2012-09-11 DIAGNOSIS — K219 Gastro-esophageal reflux disease without esophagitis: Secondary | ICD-10-CM | POA: Diagnosis present

## 2012-09-11 DIAGNOSIS — F909 Attention-deficit hyperactivity disorder, unspecified type: Secondary | ICD-10-CM | POA: Diagnosis present

## 2012-09-11 DIAGNOSIS — F411 Generalized anxiety disorder: Secondary | ICD-10-CM | POA: Insufficient documentation

## 2012-09-11 DIAGNOSIS — G4730 Sleep apnea, unspecified: Secondary | ICD-10-CM | POA: Diagnosis present

## 2012-09-11 DIAGNOSIS — F3289 Other specified depressive episodes: Secondary | ICD-10-CM | POA: Insufficient documentation

## 2012-09-11 DIAGNOSIS — F419 Anxiety disorder, unspecified: Secondary | ICD-10-CM | POA: Diagnosis present

## 2012-09-11 DIAGNOSIS — E782 Mixed hyperlipidemia: Secondary | ICD-10-CM | POA: Diagnosis present

## 2012-09-11 DIAGNOSIS — E0590 Thyrotoxicosis, unspecified without thyrotoxic crisis or storm: Secondary | ICD-10-CM | POA: Diagnosis present

## 2012-09-11 DIAGNOSIS — E039 Hypothyroidism, unspecified: Secondary | ICD-10-CM | POA: Insufficient documentation

## 2012-09-11 DIAGNOSIS — Z6838 Body mass index (BMI) 38.0-38.9, adult: Secondary | ICD-10-CM | POA: Insufficient documentation

## 2012-09-11 HISTORY — DX: Morbid (severe) obesity due to excess calories: E66.01

## 2012-09-11 HISTORY — DX: Disorder of thyroid, unspecified: E07.9

## 2012-09-11 HISTORY — DX: Sleep apnea, unspecified: G47.30

## 2012-09-11 LAB — CBC
HCT: 43.3 % (ref 36.0–46.0)
Hemoglobin: 14.8 g/dL (ref 12.0–15.0)
MCHC: 34.2 g/dL (ref 30.0–36.0)
MCV: 91 fL (ref 78.0–100.0)
WBC: 9.5 10*3/uL (ref 4.0–10.5)

## 2012-09-11 LAB — POCT I-STAT TROPONIN I

## 2012-09-11 LAB — BASIC METABOLIC PANEL
CO2: 30 mEq/L (ref 19–32)
Chloride: 103 mEq/L (ref 96–112)
Creatinine, Ser: 0.67 mg/dL (ref 0.50–1.10)
Glucose, Bld: 80 mg/dL (ref 70–99)
Sodium: 141 mEq/L (ref 135–145)

## 2012-09-11 LAB — TROPONIN I
Troponin I: <0.3 ng/mL (ref <0.30)
Troponin I: <0.3 ng/mL (ref <0.30)

## 2012-09-11 LAB — HEPATIC FUNCTION PANEL
AST: 22 U/L (ref 0–37)
Albumin: 3.5 g/dL (ref 3.5–5.2)

## 2012-09-11 LAB — LIPASE, BLOOD: Lipase: 38 U/L (ref 11–59)

## 2012-09-11 LAB — D-DIMER, QUANTITATIVE: D-Dimer, Quant: <0.27 ug/mL-FEU (ref 0.00–0.48)

## 2012-09-11 MED ORDER — ONDANSETRON HCL 4 MG/2ML IJ SOLN
4.0000 mg | Freq: Once | INTRAMUSCULAR | Status: AC
  Administered 2012-09-11: 4 mg via INTRAVENOUS
  Filled 2012-09-11: qty 2

## 2012-09-11 MED ORDER — ACETAMINOPHEN 325 MG PO TABS: 650.0000 mg | ORAL_TABLET | Freq: Four times a day (QID) | ORAL | Status: DC | PRN

## 2012-09-11 MED ORDER — SODIUM CHLORIDE 0.9 % IJ SOLN
3.0000 mL | Freq: Two times a day (BID) | INTRAMUSCULAR | Status: DC
  Administered 2012-09-12 – 2012-09-14 (×3): 3 mL via INTRAVENOUS

## 2012-09-11 MED ORDER — ENOXAPARIN SODIUM 40 MG/0.4ML ~~LOC~~ SOLN
40.0000 mg | SUBCUTANEOUS | Status: DC
  Administered 2012-09-11 – 2012-09-14 (×4): 40 mg via SUBCUTANEOUS
  Filled 2012-09-11 (×5): qty 0.4

## 2012-09-11 MED ORDER — LEVOTHYROXINE SODIUM 50 MCG PO TABS
50.0000 ug | ORAL_TABLET | Freq: Every day | ORAL | Status: DC
  Administered 2012-09-12 – 2012-09-15 (×4): 50 ug via ORAL
  Filled 2012-09-11 (×5): qty 1

## 2012-09-11 MED ORDER — DOCUSATE SODIUM 100 MG PO CAPS
100.0000 mg | ORAL_CAPSULE | Freq: Every day | ORAL | Status: DC
  Administered 2012-09-11 – 2012-09-14 (×4): 100 mg via ORAL
  Filled 2012-09-11 (×5): qty 1

## 2012-09-11 MED ORDER — IRBESARTAN 150 MG PO TABS
150.0000 mg | ORAL_TABLET | Freq: Every day | ORAL | Status: DC
  Administered 2012-09-12 – 2012-09-15 (×4): 150 mg via ORAL
  Filled 2012-09-11 (×4): qty 1

## 2012-09-11 MED ORDER — ONDANSETRON HCL 4 MG PO TABS: 4.0000 mg | ORAL_TABLET | Freq: Four times a day (QID) | ORAL | Status: DC | PRN

## 2012-09-11 MED ORDER — ACETAMINOPHEN 650 MG RE SUPP: 650.0000 mg | Freq: Four times a day (QID) | RECTAL | Status: DC | PRN

## 2012-09-11 MED ORDER — POLYETHYLENE GLYCOL 3350 17 G PO PACK
17.0000 g | PACK | Freq: Every day | ORAL | Status: DC | PRN
  Filled 2012-09-11: qty 1

## 2012-09-11 MED ORDER — MORPHINE SULFATE 2 MG/ML IJ SOLN: 1.0000 mg | INTRAMUSCULAR | Status: DC | PRN

## 2012-09-11 MED ORDER — MILNACIPRAN HCL 50 MG PO TABS
100.0000 mg | ORAL_TABLET | Freq: Two times a day (BID) | ORAL | Status: DC
  Administered 2012-09-11 – 2012-09-15 (×7): 100 mg via ORAL
  Filled 2012-09-11: qty 2
  Filled 2012-09-11: qty 1
  Filled 2012-09-11 (×4): qty 2
  Filled 2012-09-11: qty 1
  Filled 2012-09-11 (×4): qty 2
  Filled 2012-09-11 (×2): qty 1
  Filled 2012-09-11: qty 2

## 2012-09-11 MED ORDER — ALPRAZOLAM 0.5 MG PO TABS
1.0000 mg | ORAL_TABLET | Freq: Three times a day (TID) | ORAL | Status: DC | PRN
  Administered 2012-09-11 – 2012-09-14 (×3): 1 mg via ORAL
  Filled 2012-09-11: qty 1
  Filled 2012-09-11: qty 2
  Filled 2012-09-11: qty 1

## 2012-09-11 MED ORDER — ONDANSETRON HCL 4 MG/2ML IJ SOLN
4.0000 mg | Freq: Four times a day (QID) | INTRAMUSCULAR | Status: DC | PRN
  Administered 2012-09-11: 4 mg via INTRAVENOUS
  Filled 2012-09-11: qty 2

## 2012-09-11 MED ORDER — GI COCKTAIL ~~LOC~~
30.0000 mL | Freq: Once | ORAL | Status: AC
  Administered 2012-09-11: 30 mL via ORAL
  Filled 2012-09-11: qty 30

## 2012-09-11 MED ORDER — ALBUTEROL SULFATE (5 MG/ML) 0.5% IN NEBU: 2.5000 mg | INHALATION_SOLUTION | RESPIRATORY_TRACT | Status: DC | PRN

## 2012-09-11 MED ORDER — CLOBETASOL PROPIONATE 0.05 % EX OINT
TOPICAL_OINTMENT | Freq: Two times a day (BID) | CUTANEOUS | Status: DC
  Administered 2012-09-11 – 2012-09-14 (×7): via TOPICAL
  Filled 2012-09-11 (×2): qty 15

## 2012-09-11 MED ORDER — HALOBETASOL PROPIONATE 0.05 % EX CREA: 1.0000 "application " | TOPICAL_CREAM | Freq: Two times a day (BID) | CUTANEOUS | Status: DC

## 2012-09-11 MED ORDER — METHYLPHENIDATE HCL ER (OSM) 36 MG PO TBCR: 36.0000 mg | EXTENDED_RELEASE_TABLET | Freq: Every day | ORAL | Status: DC

## 2012-09-11 MED ORDER — TRAMADOL HCL 50 MG PO TABS
50.0000 mg | ORAL_TABLET | Freq: Three times a day (TID) | ORAL | Status: DC
  Administered 2012-09-11: 50 mg via ORAL
  Administered 2012-09-11: 100 mg via ORAL
  Administered 2012-09-12: 50 mg via ORAL
  Administered 2012-09-12 (×2): 100 mg via ORAL
  Administered 2012-09-13 (×2): 50 mg via ORAL
  Administered 2012-09-13 – 2012-09-14 (×2): 100 mg via ORAL
  Administered 2012-09-14 – 2012-09-15 (×3): 50 mg via ORAL
  Filled 2012-09-11 (×2): qty 2
  Filled 2012-09-11 (×3): qty 1
  Filled 2012-09-11: qty 2
  Filled 2012-09-11: qty 1
  Filled 2012-09-11: qty 2
  Filled 2012-09-11 (×5): qty 1

## 2012-09-11 MED ORDER — SODIUM CHLORIDE 0.9 % IV SOLN
INTRAVENOUS | Status: AC
  Administered 2012-09-11: 14:00:00 via INTRAVENOUS

## 2012-09-11 MED ORDER — BISOPROLOL-HYDROCHLOROTHIAZIDE 5-6.25 MG PO TABS
1.0000 | ORAL_TABLET | Freq: Every day | ORAL | Status: DC
  Administered 2012-09-12 – 2012-09-14 (×3): 1 via ORAL
  Filled 2012-09-11 (×6): qty 1

## 2012-09-11 MED ORDER — ASPIRIN 81 MG PO CHEW
324.0000 mg | CHEWABLE_TABLET | Freq: Once | ORAL | Status: AC
  Administered 2012-09-11: 324 mg via ORAL
  Filled 2012-09-11: qty 4
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
## 2012-09-11 NOTE — ED Provider Notes (Signed)
History  This chart was scribed for Shaune Pollack, MD by Jenne Campus, ED Scribe. This patient was seen in room APA18/[facility]18 and the patient's care was started at 9:26 AM.  CSN: [id number]  Arrival date & time 09/11/12  0920   First MD Initiated Contact with Patient 09/11/12 [phone]      Chief Complaint  Patient presents with  . Chest Pain    Patient is a 57 y.o. female presenting with chest pain. The history is provided by the patient. No language interpreter was used.  Chest Pain Pain location:  L chest Pain quality: dull   Pain radiates to:  L shoulder and L arm Onset quality:  Sudden Duration:  2 hours Timing:  Constant Progression:  Unchanged Chronicity:  Recurrent Relieved by:  Nothing Associated symptoms: abdominal pain, dizziness and nausea   Associated symptoms: no cough, no diaphoresis, no fever, no headache, no shortness of breath and not vomiting   Risk factors: hypertension and prior DVT/PE     TONJUA ROSSETTI is a 57 y.o. female who presents to the Emergency Department complaining of two hours of sudden onset, non-changing, constant left-sided CP along the sternum described as dull that radiates into her left shoulder and down her left arm with associated epigastric abdominal pain, nausea and dizziness that started while taking a child to school. She reports that she was arguing with the child when the pain started. She denies having any modifying factors with the pain and reports taking tramadol and tylenol for the pain with no improvement. She states that she went to work [employer] [facility] [facility] [facility]) after the pain started but came to the ED when the pain didn't improve. She reports experiencing prior less severe episodes of the same and states that she followed up with a Cardiologist one year ago. She denies having a stress test done at the time and denies having recurrent pain since then. She denies ingesting caffeine today, having any recent abdominal surgeries or having a  family h/o gallstones. She has a h/o PE 5 years ago but denies taking ASA today or being on anticoagulants currently. She denies diaphoresis, SOB, leg swelling and emesis as associated symptoms. She also has a h/o HTN, depression and fibromyalgia. She is an occasional alcohol user and former smoker.  Past Medical History  Diagnosis Date  . Fibromyalgia   . Hypertension   . PE (pulmonary embolism)   . Depression   . Chronic pain in left foot   . Chronic abdominal pain   . IBS (irritable bowel syndrome)   . Gastritis   . Internal hemorrhoids   . Kidney stone   . Thyroid disease     Past Surgical History  Procedure Laterality Date  . Cesarean section    . Breast lumpectomy    . Dilation and curettage of uterus    . Abdominal surgery    . Cystoscopy w/ retrogrades  01/23/2012    Procedure: CYSTOSCOPY WITH RETROGRADE PYELOGRAM;  Surgeon: Marissa Nestle, MD;  Location: [facility];  Service: Urology;  Laterality: Left;  . Removal of stones  01/23/2012    Procedure: REMOVAL OF STONES;  Surgeon: Marissa Nestle, MD;  Location: [facility];  Service: Urology;  Laterality: N/A;    No family history on file.  History  Substance Use Topics  . Smoking status: Former Research scientist (life sciences)  . Smokeless tobacco: Not on file  . Alcohol Use: Yes     Comment: ocassionally    No OB history provided.  Review of Systems  Constitutional: Negative for fever and diaphoresis.  Eyes: Negative for visual disturbance.  Respiratory: Negative for cough and shortness of breath.   Cardiovascular: Positive for chest pain.  Gastrointestinal: Positive for nausea and abdominal pain. Negative for vomiting and diarrhea.  Neurological: Positive for dizziness. Negative for headaches.  All other systems reviewed and are negative.    Allergies  Sulfonamide derivatives  Home Medications   Current Outpatient Rx  Name  Route  Sig  Dispense  Refill  . ALPRAZolam (XANAX) 1 MG tablet   Oral   Take 1 mg by mouth 3 (three)  times daily as needed. Anxiety         . bisoprolol-hydrochlorothiazide (ZIAC) 5-6.25 MG per tablet   Oral   Take 1 tablet by mouth daily.         . Cholecalciferol (VITAMIN D) 2000 UNITS CAPS   Oral   Take 1 capsule by mouth daily.         . clotrimazole-betamethasone (LOTRISONE) cream   Topical   Apply 1 application topically daily.         Marland Kitchen docusate sodium (COLACE) 100 MG capsule   Oral   Take 100 mg by mouth at bedtime.         . fish oil-omega-3 fatty acids 1000 MG capsule   Oral   Take 1 g by mouth daily.         Marland Kitchen levothyroxine (SYNTHROID, LEVOTHROID) 50 MCG tablet   Oral   Take 50 mcg by mouth daily.         Marland Kitchen MAGNESIUM PO   Oral   Take 1 tablet by mouth daily.         . meclizine (ANTIVERT) 25 MG tablet   Oral   Take 25 mg by mouth 3 (three) times daily as needed. Dizziness         . methylphenidate (CONCERTA) 36 MG CR tablet   Oral   Take 36 mg by mouth daily.         . metroNIDAZOLE (METROCREAM) 0.75 % cream   Topical   Apply 1 application topically 2 (two) times daily.         . Milnacipran HCl (SAVELLA) 100 MG TABS tablet   Oral   Take 100 mg by mouth 2 (two) times daily.         . polyethylene glycol (MIRALAX / GLYCOLAX) packet   Oral   Take 17 g by mouth daily as needed. Constipation         . telmisartan (MICARDIS) 40 MG tablet   Oral   Take 40 mg by mouth daily.         . traMADol (ULTRAM) 50 MG tablet   Oral   Take 50-100 mg by mouth 3 (three) times daily.           Triage Vitals: BP 160/87  Pulse 87  Temp(Src) 98.1 F (36.7 C) (Oral)  Resp 22  Ht 5' 4"  (1.626 m)  Wt 218 lb (98.884 kg)  BMI 37.4 kg/m2  SpO2 98%  Physical Exam  Nursing note and vitals reviewed. Constitutional: She is oriented to person, place, and time. She appears well-developed and well-nourished.  HENT:  Head: Normocephalic and atraumatic.  Mouth/Throat: Oropharynx is clear and moist.  Eyes: Conjunctivae and EOM are normal.  Pupils are equal, round, and reactive to light.  Neck: Normal range of motion. Neck supple. No tracheal deviation present.  Cardiovascular: Normal rate and regular  rhythm.  Exam reveals no gallop and no friction rub.   No murmur heard. Pulmonary/Chest: Effort normal and breath sounds normal. No respiratory distress. She has no wheezes. She has no rales.  Abdominal: Soft. She exhibits no distension and no mass. There is tenderness (epigastric and RUQ). There is no rebound and no guarding.  Musculoskeletal: Normal range of motion. She exhibits no edema.  Neurological: She is alert and oriented to person, place, and time. No cranial nerve deficit.  Skin: Skin is warm and dry. Rash (psorasis on bilateral lower extremities ) noted. She is not diaphoretic.  Psychiatric: She has a normal mood and affect. Her behavior is normal.    ED Course  Procedures (including critical care time)  DIAGNOSTIC STUDIES: Oxygen Saturation is 98% on room air, normal by my interpretation.    COORDINATION OF CARE: 9:48 AM-Discussed treatment plan which includes GI cocktail, ASA, CXR, CBC panel, and troponin with pt at bedside and pt agreed to plan.   10:00 AM-Ordered GI cocktail, 324 mg ASA and 4 mg Zofran injection  12:50 PM-Pt rechecked and feels improved.  Labs Reviewed  CBC  BASIC METABOLIC PANEL  TROPONIN I  LIPASE, BLOOD  HEPATIC FUNCTION PANEL  POCT I-STAT TROPONIN I   Dg Chest 2 View  09/11/2012  *RADIOLOGY REPORT*  Clinical Data: Chest pain, nausea, vomiting  CHEST - 2 VIEW  Comparison: 09/11/2012, 12/07/2011  Findings: Normal heart size and vascularity.  Minimal left base atelectasis.  No CHF, pneumonia, collapse, consolidation, effusion or pneumothorax.  Trachea midline.  IMPRESSION: Basilar atelectasis.  No acute process.   Original Report Authenticated By: Jerilynn Mages. Annamaria Boots, M.D.    Dg Chest Port 1 View  09/11/2012  *RADIOLOGY REPORT*  Clinical Data: Chest pain, nausea, vomiting  PORTABLE CHEST - 1 VIEW   Comparison: 12/07/2011; 06/01/2008; chest CT - 07/06/2008  Findings:  Examination is degraded secondary to patient body habitus and portable technique.  Grossly unchanged cardiac silhouette and mediastinal contours given persistently reduced lung volumes. Minimal bibasilar opacity favored to represent atelectasis.  No focal airspace opacity.  No definite pleural effusion or pneumothorax.  No definite evidence of edema.  Grossly unchanged bones.  IMPRESSION: No definite acute cardiopulmonary disease on this low volume portable [facility] examination.  Further evaluation with a PA and lateral chest radiograph may be obtained as clinically indicated.   Original Report Authenticated By: Jake Seats, MD    US Abdomen Limited Ruq  09/11/2012  *RADIOLOGY REPORT*  Clinical Data:  Right upper quadrant pain, history gastritis, irritable bowel syndrome, fibromyalgia, hyperlipidemia  LIMITED ABDOMINAL ULTRASOUND - RIGHT UPPER QUADRANT  Comparison:  None  Findings:  Gallbladder:  Normally distended without stones or wall thickening. No pericholecystic fluid or sonographic Murphy sign.  Common bile duct:  Upper normal caliber 6 mm diameter  Liver:  Echogenic, likely fatty infiltration, though this can be seen with cirrhosis and certain infiltrative disorders.  Suboptimal visualization of intrahepatic detail due to body habitus and increased hepatic echogenicity.  No gross hepatic mass or nodularity identified though intrahepatic pathology cannot completely excluded.  Hepatopetal portal venous flow.  No right upper quadrant ascites identified.  IMPRESSION: Probable fatty infiltration of liver. Suboptimal assessment of intrahepatic detail due to poor sound transmission and body habitus; unable to exclude intrahepatic pathology on this exam. No other upper abdominal sonographic abnormalities identified. If better upper abdominal/hepatic visualization is required recommend CT with IV and oral contrast.  Original Report  Authenticated By: Lavonia Dana, M.D.      No diagnosis found.   Date: 09/11/2012  Rate: 84  Rhythm: normal sinus rhythm  QRS Axis: normal  Intervals: normal  ST/T Wave abnormalities: nonspecific ST changes  Conduction Disutrbances:none  Narrative Interpretation:   Old EKG Reviewed: unchanged   I personally performed the services described in this documentation, which was scribed in my presence. The recorded information has been reviewed and considered.  MDM  57 y.o. Female history of hypertension who presents today complaining of onset of substernal chest pain with left-sided chest pain radiating to shoulder and upper back this a.m. Followed by pain in the epigastrium and right upper quadrant with nausea but no vomiting. EKG here is unremarkable. Patient's pain was 3 at the worst and is now resolved.     Patient's care discussed with Dr. Forrestine Him on. The patient will be admitted to observation status to a telemetry bed on team 2. I have discussed the plans with the patient and she voices understanding.        Shaune Pollack, MD 09/11/12 [phone]

## 2012-09-12 ENCOUNTER — Observation Stay (HOSPITAL_COMMUNITY): Payer: PRIVATE HEALTH INSURANCE

## 2012-09-12 ENCOUNTER — Encounter (HOSPITAL_COMMUNITY): Payer: Self-pay | Admitting: Adult Health

## 2012-09-12 DIAGNOSIS — R072 Precordial pain: Secondary | ICD-10-CM

## 2012-09-12 LAB — TROPONIN I: Troponin I: <0.3 ng/mL (ref <0.30)

## 2012-09-12 MED ORDER — NITROGLYCERIN 0.4 MG SL SUBL
SUBLINGUAL_TABLET | SUBLINGUAL | Status: AC
  Administered 2012-09-12: 0.4 mg via SUBLINGUAL
  Filled 2012-09-12: qty 25

## 2012-09-12 MED ORDER — NITROGLYCERIN 0.4 MG SL SUBL
0.4000 mg | SUBLINGUAL_TABLET | SUBLINGUAL | Status: DC | PRN
  Administered 2012-09-12 – 2012-09-13 (×2): 0.4 mg via SUBLINGUAL

## 2012-09-12 MED ORDER — TECHNETIUM TC 99M SESTAMIBI - CARDIOLITE
10.0000 | Freq: Once | INTRAVENOUS | Status: AC | PRN
  Administered 2012-09-12: 10 via INTRAVENOUS

## 2012-09-12 MED ORDER — REGADENOSON 0.4 MG/5ML IV SOLN
0.4000 mg | Freq: Once | INTRAVENOUS | Status: AC
  Administered 2012-09-14: 0.4 mg via INTRAVENOUS
  Filled 2012-09-12: qty 5

## 2012-09-12 NOTE — Progress Notes (Signed)
[facility] Chart Review Completed  

## 2012-09-12 NOTE — Progress Notes (Signed)
Sublingual nitro given x 1. Symptoms resolved. Oxygen applied also.

## 2012-09-12 NOTE — Progress Notes (Signed)
[facility] PROGRESS NOTE  Yolanda [name] CBU:[id number] DOB: April 07, 1956 DOA: 09/11/2012 PCP: Purvis Kilts, MD  Assessment/Plan: Principal Problem:   Precordial pain: Multiple risk factors and her chest pain is concerning given description of arm numbness and pressure. [place] [facility]. Appreciate fall. Plan is for stress test tomorrow. If she worsens or there is a clinical change, [facility] although right now that is not the plan. Active Problems:   HYPERLIPIDEMIA: Patient's previous diagnosis but has not been on a statin by her choice. Lipid panel ordered this morning, but ordered did not go through. We'll recheck tomorrow morning.   DEPRESSION: Continue home meds   HYPERTENSION: Continue home meds   GERD   SLEEP APNEA   Obesity, morbid   Hypothyroidism: Continue Synthroid   ADHD (attention deficit hyperactivity disorder)   Anxiety: Continue Xanax   Code Status: Full code  Family Communication: Plan discussed with patient at the bedside Disposition Plan: Possibly home tomorrow after stress test   Consultants:  [place] [facility]  Procedures:  None  Antibiotics:  None  HPI/Subjective: Patient doing okay. Occasionally she has been having episodes of chest pain which he describes as sharp and stabbing with some pressure following. No associated shortness of breath.  Objective: Filed Vitals:   09/12/12 1000 09/12/12 1210 09/12/12 1304 09/12/12 1404  BP: 129/81 109/70 121/82 104/59  Pulse: 76 75 71 58  Temp: 98.3 F (36.8 C)   97.9 F (36.6 C)  TempSrc:    Oral  Resp: 18 18  18   Height:      Weight:      SpO2: 97% 96% 99% 100%    Intake/Output Summary (Last 24 hours) at 09/12/12 1743 Last data filed at 09/12/12 1300  Gross per 24 hour  Intake    840 ml  Output      0 ml  Net    840 ml   Filed Weights   09/11/12 0932 09/11/12 1628 09/12/12 0524  Weight: 98.884 kg (218 lb) 100.8 kg (222 lb 3.6 oz)  103.012 kg (227 lb 1.6 oz)    Exam:   General:  Alert and oriented x3, mild distress with occasional episodes of chest pain.  Cardiovascular: Regular rate and rhythm, S1-S2  Respiratory: Clear to auscultation bilaterally  Abdomen: Soft, morbidly obese, nontender, positive bowel sounds  Musculoskeletal: No clubbing or cyanosis, trace pitting edema   Data Reviewed: Basic Metabolic Panel:  Recent Labs Lab 09/11/12 0935  NA 141  K 3.5  CL 103  CO2 30  GLUCOSE 80  BUN 11  CREATININE 0.67  CALCIUM 9.3   Liver Function Tests:  Recent Labs Lab 09/11/12 0952  AST 22  ALT 22  ALKPHOS 90  BILITOT 0.4  PROT 7.8  ALBUMIN 3.5    Recent Labs Lab 09/11/12 0950  LIPASE 38   CBC:  Recent Labs Lab 09/11/12 0935  WBC 9.5  HGB 14.8  HCT 43.3  MCV 91.0  PLT 239   Cardiac Enzymes:  Recent Labs Lab 09/11/12 0935 09/11/12 1757 09/11/12 2340 09/12/12 0529  TROPONINI <0.30 <0.30 <0.30 <0.30    Studies: Dg Chest 2 View  09/11/2012    IMPRESSION: Basilar atelectasis.  No acute process.   Original Report Authenticated By: Jerilynn Mages. Shick, M.D.    Dg Chest Port 1 View  09/11/2012   IMPRESSION: No definite acute cardiopulmonary disease on this low volume portable [facility] examination.  Further evaluation with a PA and lateral  chest radiograph may be obtained as clinically indicated.   Original Report Authenticated By: Jake Seats, MD    US Abdomen Limited Ruq  09/11/2012    IMPRESSION: Probable fatty infiltration of liver. Suboptimal assessment of intrahepatic detail due to poor sound transmission and body habitus; unable to exclude intrahepatic pathology on this exam. No other upper abdominal sonographic abnormalities identified. If better upper abdominal/hepatic visualization is required recommend CT with IV and oral contrast.                    Original Report Authenticated By: Lavonia Dana, M.D.     Scheduled Meds: . bisoprolol-hydrochlorothiazide  1 tablet Oral Daily  .  clobetasol ointment   Topical BID  . docusate sodium  100 mg Oral QHS  . enoxaparin (LOVENOX) injection  40 mg Subcutaneous Q24H  . irbesartan  150 mg Oral Daily  . levothyroxine  50 mcg Oral QAC breakfast  . methylphenidate  36 mg Oral Daily  . Milnacipran  100 mg Oral BID  . [START ON 09/13/2012] regadenoson  0.4 mg Intravenous Once  . sodium chloride  3 mL Intravenous Q12H  . traMADol  50-100 mg Oral TID   Continuous Infusions:   Principal Problem:   Precordial pain Active Problems:   HYPERLIPIDEMIA   DEPRESSION   HYPERTENSION   GERD   SLEEP APNEA   Obesity, morbid   Hypothyroidism   ADHD (attention deficit hyperactivity disorder)   Anxiety    Time spent: 20 minutes    [facility] Pager [phone]. If 7PM-7AM, please contact night-coverage at [email or web address], password [facility] 09/12/2012, 5:43 PM  LOS: 1 day
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
## 2012-09-12 NOTE — Progress Notes (Signed)
*  PRELIMINARY RESULTS* Echocardiogram 2D Echocardiogram has been performed.  Tera Partridge 09/12/2012, 10:43 AM

## 2012-09-12 NOTE — Progress Notes (Signed)
Complained of chest pain and left arm pain and tingling to left arm with chest pressure. Stat EKG obtained. No changes. Dr Megan Salon paged. Orders given for nitro x 3.

## 2012-09-12 NOTE — Progress Notes (Signed)
Pt's EKG results of sinus bradycardia.  Dr. Maryland Pink notified via text page.  Returned call and stated no changes at this time.  Continue to monitor. Orders followed.

## 2012-09-12 NOTE — Progress Notes (Signed)
Pt c/o left chest pressure 2 of 10.  Oxygen being worn by pt.  Dr. Maryland Pink notified via text page.  Nitro x1 given with relief.  Stat EKG ordered.

## 2012-09-13 ENCOUNTER — Encounter (HOSPITAL_COMMUNITY): Payer: Self-pay

## 2012-09-13 ENCOUNTER — Ambulatory Visit (HOSPITAL_COMMUNITY): Payer: Self-pay

## 2012-09-13 DIAGNOSIS — F909 Attention-deficit hyperactivity disorder, unspecified type: Secondary | ICD-10-CM

## 2012-09-13 DIAGNOSIS — R079 Chest pain, unspecified: Secondary | ICD-10-CM

## 2012-09-13 LAB — LIPID PANEL
HDL: 36 mg/dL — ABNORMAL LOW (ref >39)
Total CHOL/HDL Ratio: 4.7 RATIO
Triglycerides: 164 mg/dL — ABNORMAL HIGH (ref <150)

## 2012-09-13 MED ORDER — PANTOPRAZOLE SODIUM 40 MG PO TBEC
40.0000 mg | DELAYED_RELEASE_TABLET | Freq: Every day | ORAL | Status: DC
  Administered 2012-09-15: 40 mg via ORAL
  Filled 2012-09-13 (×2): qty 1

## 2012-09-13 NOTE — Progress Notes (Signed)
[facility] PROGRESS NOTE  Yolanda [name] DJM:[id number] DOB: [birth year] DOA: 09/11/2012 PCP: Purvis Kilts, MD  Assessment/Plan: Principal Problem:   Precordial pain: Multiple risk factors and her chest pain is concerning given description of arm numbness and pressure. She has negative cardiac markers and no acute EKG changes.  Due to history and risk factors, there are concerns for underlying angina.  She has been followed by Cardiology.  She had resting images for stress test yesterday, but was unable to complete the test today due to lack of services at [facility].  Plan Discussed with Dr. Johnsie Cancel and patient will be transferred [facility] for further stress testing and cardiac cath if necessary. I have discussed the case with Dr. Allyson Sabal who has agreed to accept the patient in transfer. Active Problems:   HYPERLIPIDEMIA: LDL is borderline at 100. Will start the patient on statin.   DEPRESSION: Continue home meds   HYPERTENSION: Continue home meds   GERD   SLEEP APNEA   Obesity, morbid   Hypothyroidism: Continue Synthroid   ADHD (attention deficit hyperactivity disorder)   Anxiety: Continue Xanax   Code Status: Full code  Family Communication: Plan discussed with patient at the bedside Disposition Plan: Possibly home tomorrow after stress test   Consultants:  [facility]  Procedures:  None  Antibiotics:  None  HPI/Subjective: Patient had continued chest discomfort overnight which was resolved with SL nitro.  Objective: Filed Vitals:   09/12/12 2005 09/12/12 2057 09/13/12 0535 09/13/12 1433  BP:  104/61 92/61 144/86  Pulse:  68 66 84  Temp:  97.9 F (36.6 C) 97.3 F (36.3 C)   TempSrc:  Oral Oral   Resp:  20 20   Height:      Weight:      SpO2: 100% 99% 98% 98%    Intake/Output Summary (Last 24 hours) at 09/13/12 1613 Last data filed at 09/13/12 1054  Gross per 24 hour  Intake      3 ml  Output      0 ml  Net      3 ml   Filed  Weights   09/11/12 0932 09/11/12 1628 09/12/12 0524  Weight: 98.884 kg (218 lb) 100.8 kg (222 lb 3.6 oz) 103.012 kg (227 lb 1.6 oz)    Exam:   General:  Alert and oriented x3, mild distress with occasional episodes of chest pain.  Cardiovascular: Regular rate and rhythm, S1-S2  Respiratory: Clear to auscultation bilaterally  Abdomen: Soft, morbidly obese, nontender, positive bowel sounds  Musculoskeletal: No clubbing or cyanosis, trace pitting edema   Data Reviewed: Basic Metabolic Panel:  Recent Labs Lab 09/11/12 0935  NA 141  K 3.5  CL 103  CO2 30  GLUCOSE 80  BUN 11  CREATININE 0.67  CALCIUM 9.3   Liver Function Tests:  Recent Labs Lab 09/11/12 0952  AST 22  ALT 22  ALKPHOS 90  BILITOT 0.4  PROT 7.8  ALBUMIN 3.5    Recent Labs Lab 09/11/12 0950  LIPASE 38   CBC:  Recent Labs Lab 09/11/12 0935  WBC 9.5  HGB 14.8  HCT 43.3  MCV 91.0  PLT 239   Cardiac Enzymes:  Recent Labs Lab 09/11/12 0935 09/11/12 1757 09/11/12 2340 09/12/12 0529  TROPONINI <0.30 <0.30 <0.30 <0.30    Studies: Dg Chest 2 View  09/11/2012    IMPRESSION: Basilar atelectasis.  No acute process.   Original Report Authenticated By: Jerilynn Mages. Annamaria Boots, M.D.    Dg  Chest Port 1 View  09/11/2012   IMPRESSION: No definite acute cardiopulmonary disease on this low volume portable [facility] examination.  Further evaluation with a PA and lateral chest radiograph may be obtained as clinically indicated.   Original Report Authenticated By: Jake Seats, MD    US Abdomen Limited Ruq  09/11/2012    IMPRESSION: Probable fatty infiltration of liver. Suboptimal assessment of intrahepatic detail due to poor sound transmission and body habitus; unable to exclude intrahepatic pathology on this exam. No other upper abdominal sonographic abnormalities identified. If better upper abdominal/hepatic visualization is required recommend CT with IV and oral contrast.                    Original Report Authenticated  By: Lavonia Dana, M.D.     Scheduled Meds: . bisoprolol-hydrochlorothiazide  1 tablet Oral Daily  . clobetasol ointment   Topical BID  . docusate sodium  100 mg Oral QHS  . enoxaparin (LOVENOX) injection  40 mg Subcutaneous Q24H  . irbesartan  150 mg Oral Daily  . levothyroxine  50 mcg Oral QAC breakfast  . Milnacipran  100 mg Oral BID  . regadenoson  0.4 mg Intravenous Once  . sodium chloride  3 mL Intravenous Q12H  . traMADol  50-100 mg Oral TID   Continuous Infusions:   Principal Problem:   Precordial pain Active Problems:   HYPERLIPIDEMIA   DEPRESSION   HYPERTENSION   GERD   SLEEP APNEA   Obesity, morbid   Hypothyroidism   ADHD (attention deficit hyperactivity disorder)   Anxiety    Time spent: 40 minutes    [facility] Pager [phone]. If 7PM-7AM, please contact night-coverage at [email or web address], password [email or web address] 09/13/2012, 4:13 PM  LOS: 2 days

## 2012-09-13 NOTE — Progress Notes (Signed)
Transferred to [facility] in stable condition with carelink, no active CP at present.

## 2012-09-13 NOTE — Progress Notes (Signed)
UR chart review completed.  

## 2012-09-13 NOTE — Progress Notes (Signed)
Yolanda [name]  57 y.o.  female  Subjective: Generally has been asymptomatic in hospital; however, she has experienced a few episodes of chest tightness relieved with nitroglycerin. EKGs taken immediately after administration of NTG have been normal. Episodes lasted up to 30 minutes with prompt earlier by nitroglycerin.  She has not had similar symptoms before, but she has been told that she has GERD. Changes in diet and behavior resulted in virtual resolution of symptoms. As a result, she has not chronically been treated with an acid suppressant medication  Allergy: Sulfonamide derivatives  Objective: Vital signs in last 24 hours: Temp:  [97.3 F (36.3 C)-97.9 F (36.6 C)] 97.8 F (36.6 C) (03/07 1615) Pulse Rate:  [60-84] 75 (03/07 1615) Resp:  [18-20] 20 (03/07 1615) BP: (92-144)/(60-86) 114/69 mmHg (03/07 1615) SpO2:  [97 %-100 %] 97 % (03/07 1615)  103.012 kg (227 lb 1.6 oz) Body mass index is 38.96 kg/(m^2).  Weight change:  Last BM Date: 09/11/12  Intake/Output from previous day: 03/06 0701 - 03/07 0700 In: 83 [P.O.:540] Out: -   General- Well developed; no acute distress; moderately overweight and Neck- No JVD, no carotid bruits Lungs- clear lung fields; normal I:E ratio Cardiovascular- normal PMI; normal S1 and S2; S4 present Abdomen- normal bowel sounds; soft and non-tender without masses or organomegaly Skin- Warm, no significant lesions Extremities- Nl distal pulses; no edema  Lab Results: Cardiac Markers:   Recent Labs  09/11/12 2340 09/12/12 0529  TROPONINI <0.30 <0.30   CBC:   Recent Labs  09/11/12 0935  WBC 9.5  HGB 14.8  HCT 43.3  PLT 239   BMET:  Recent Labs  09/11/12 0935  NA 141  K 3.5  CL 103  CO2 30  GLUCOSE 80  BUN 11  CREATININE 0.67  CALCIUM 9.3   Hepatic Function:   Recent Labs  09/11/12 0952  PROT 7.8  ALBUMIN 3.5  AST 22  ALT 22  ALKPHOS 90  BILITOT 0.4  BILIDIR <0.1  IBILI NOT CALCULATED   GFR:  Estimated  Creatinine Clearance: 89.5 ml/min (by C-G formula based on Cr of 0.67). Lipids:  Lipid Panel     Component Value Date/Time   CHOL 169 09/13/2012 0517   TRIG 164* 09/13/2012 0517   HDL 36* 09/13/2012 0517   CHOLHDL 4.7 09/13/2012 0517   VLDL 33 09/13/2012 0517   LDLCALC 100* 09/13/2012 0517   EKG: Normal sinus rhythm; low-voltage in the chest leads; slightly delayed R-wave progression; otherwise normal.  Medications:  I have reviewed the patient's current medications. Scheduled: . bisoprolol-hydrochlorothiazide  1 tablet Oral Daily  . clobetasol ointment   Topical BID  . docusate sodium  100 mg Oral QHS  . enoxaparin (LOVENOX) injection  40 mg Subcutaneous Q24H  . irbesartan  150 mg Oral Daily  . levothyroxine  50 mcg Oral QAC breakfast  . Milnacipran  100 mg Oral BID  . regadenoson  0.4 mg Intravenous Once  . sodium chloride  3 mL Intravenous Q12H  . traMADol  50-100 mg Oral TID    Assessment/Plan: Chest discomfort: Quality of her chest discomfort is consistent with myocardial ischemia; however, benign exam, EKGs and cardiac markers are somewhat reassuring. Patient is in the midst of a 2 day stress nuclear study, which is to be completed at [facility] tomorrow.  A PPI will be added empirically to her medical regime.  Hypertension: Blood pressure is well-controlled on her multidrug regimen.  LOS: 2 days   Herbie Baltimore  Lattie Haw, M.D. 09/13/2012, 4:49 PM

## 2012-09-13 NOTE — Progress Notes (Signed)
Patient ID: Yolanda [name], female   DOB: [birth year], 57 y.o.   MRN: [id number] Spoke with hospitalist at [facility] No nuclear services there today due to weather Patient had rest images yesterday and was to have stress images today Will transfer patient to [facility] for lexiscan portion in am Nuc Med will try to get the rest images transferred  Patient is anxious and has SSCP relieved with nitro If this continues while at [facility] Can cancel myovue and put on for cath Monday or if stable do Lexiscan 3/8 and triage  Jenkins Rouge

## 2012-09-13 NOTE — Progress Notes (Signed)
Pt c/o chest pain and rated at 3 describing it as an "elephant sitting on her chest and pressure."  CP protocol initiated and VS: BP 144/86 HR 84 RR 20 O2 sats 98 on 1L.  She also c/o nausea.  1 nitroglycerin given per protocol. Notify MD of the episode  With reassessment she now c/o pain at a 0.

## 2012-09-14 DIAGNOSIS — R079 Chest pain, unspecified: Secondary | ICD-10-CM

## 2012-09-14 MED ORDER — TECHNETIUM TC 99M SESTAMIBI GENERIC - CARDIOLITE
30.0000 | Freq: Once | INTRAVENOUS | Status: AC | PRN
  Administered 2012-09-14: 30 via INTRAVENOUS

## 2012-09-14 NOTE — Progress Notes (Addendum)
[facility] PROGRESS NOTE  Yolanda [name]:[id number] DOB: 1956-06-02 DOA: 09/11/2012 PCP: Purvis Kilts, MD  Assessment/Plan: Principal Problem:   Precordial pain: Multiple risk factors and her chest pain is concerning given description of arm numbness and pressure. She has negative cardiac markers and no acute EKG changes.  Due to history and risk factors, there are concerns for underlying angina.  She has been followed by Cardiology.  She had resting images for stress test yesterday, but was unable to complete the test today due to lack of services at [facility].  Plan Discussed with Dr. Johnsie Cancel and patient will be transferred to [facility] for further stress testing and cardiac cath if necessary. Stress test performed, Results still pending Active Problems:   HYPERLIPIDEMIA: LDL is borderline at 100. Continue statin.   DEPRESSION: Continue home meds   HYPERTENSION: Continue home meds   GERD   SLEEP APNEA   Obesity, morbid   Hypothyroidism: Continue Synthroid   ADHD (attention deficit hyperactivity disorder)   Anxiety: Continue Xanax   Code Status: Full code  Family Communication: Plan discussed with patient at the bedside Disposition Plan: Possibly home tomorrow after stress test   Consultants:  [place] [facility]  Procedures:  None  Antibiotics:  None  HPI/Subjective: Doing fair.  NO furher CP, no n/v/sob  Objective: Filed Vitals:   09/14/12 1048 09/14/12 1050 09/14/12 1404 09/14/12 1443  BP: 132/86 127/78 143/80 120/77  Pulse: 97 93 78 80  Temp:    97.9 F (36.6 C)  TempSrc:    Oral  Resp:    18  Height:      Weight:      SpO2:    94%    Intake/Output Summary (Last 24 hours) at 09/14/12 1728 Last data filed at 09/14/12 1300  Gross per 24 hour  Intake    240 ml  Output      0 ml  Net    240 ml   Filed Weights   09/11/12 0932 09/11/12 1628 09/12/12 0524  Weight: 98.884 kg (218 lb) 100.8 kg (222 lb 3.6 oz) 103.012 kg (227 lb 1.6 oz)     Exam:   General:  Alert and oriented x3, mild distress with occasional episodes of chest pain.  Cardiovascular: Regular rate and rhythm, S1-S2  Respiratory: Clear to auscultation bilaterally  Data Reviewed: Basic Metabolic Panel:  Recent Labs Lab 09/11/12 0935  NA 141  K 3.5  CL 103  CO2 30  GLUCOSE 80  BUN 11  CREATININE 0.67  CALCIUM 9.3   Liver Function Tests:  Recent Labs Lab 09/11/12 0952  AST 22  ALT 22  ALKPHOS 90  BILITOT 0.4  PROT 7.8  ALBUMIN 3.5    Recent Labs Lab 09/11/12 0950  LIPASE 38   CBC:  Recent Labs Lab 09/11/12 0935  WBC 9.5  HGB 14.8  HCT 43.3  MCV 91.0  PLT 239   Cardiac Enzymes:  Recent Labs Lab 09/11/12 0935 09/11/12 1757 09/11/12 2340 09/12/12 0529  TROPONINI <0.30 <0.30 <0.30 <0.30    Studies: Dg Chest 2 View  09/11/2012    IMPRESSION: Basilar atelectasis.  No acute process.   Original Report Authenticated By: Jerilynn Mages. Shick, M.D.    Dg Chest Port 1 View  09/11/2012   IMPRESSION: No definite acute cardiopulmonary disease on this low volume portable AP examination.  Further evaluation with a PA and lateral chest radiograph may be obtained as clinically indicated.   Original Report Authenticated By: Sandi Mariscal  V, MD    US Abdomen Limited Ruq  09/11/2012    IMPRESSION: Probable fatty infiltration of liver. Suboptimal assessment of intrahepatic detail due to poor sound transmission and body habitus; unable to exclude intrahepatic pathology on this exam. No other upper abdominal sonographic abnormalities identified. If better upper abdominal/hepatic visualization is required recommend CT with IV and oral contrast.                    Original Report Authenticated By: Lavonia Dana, M.D.     Scheduled Meds: . bisoprolol-hydrochlorothiazide  1 tablet Oral Daily  . clobetasol ointment   Topical BID  . docusate sodium  100 mg Oral QHS  . enoxaparin (LOVENOX) injection  40 mg Subcutaneous Q24H  . irbesartan  150 mg Oral Daily   . levothyroxine  50 mcg Oral QAC breakfast  . Milnacipran  100 mg Oral BID  . pantoprazole  40 mg Oral Daily  . sodium chloride  3 mL Intravenous Q12H  . traMADol  50-100 mg Oral TID   Continuous Infusions:   Principal Problem:   Precordial pain Active Problems:   HYPERLIPIDEMIA   DEPRESSION   HYPERTENSION   GERD   SLEEP APNEA   Obesity, morbid   Hypothyroidism   ADHD (attention deficit hyperactivity disorder)   Anxiety    Time spent: 40 minutes    Verlon Au [place] [facility] Pager [phone]  If 7PM-7AM, please contact night-coverage at [email or web address], password [facility] 09/14/2012, 5:28 PM  LOS: 3 days

## 2012-09-14 NOTE — Progress Notes (Signed)
Patient: Yolanda [name] / Admit Date: 09/11/2012 / Date of Encounter: 09/14/2012, 10:22 AM   Subjective  No CP or SOB.   Objective   Telemetry: unable to review as pt seen in nuc lab Physical Exam: Filed Vitals:   09/14/12 0500  BP: 103/70  Pulse: 68  Temp: 97.8 F (36.6 C)  Resp: 18   General: Well developed, well nourished obese WF in no acute distress. Head: Normocephalic, atraumatic, sclera non-icteric, no xanthomas, nares are without discharge. Neck: Negative for carotid bruits. JVD not elevated. Lungs: Clear bilaterally to auscultation without wheezes, rales, or rhonchi. Breathing is unlabored. Heart: RRR S1 S2 without murmurs, rubs, or gallops.  Abdomen: Soft, non-tender, non-distended with normoactive bowel sounds. No hepatomegaly. No rebound/guarding. No obvious abdominal masses. Msk:  Strength and tone appear normal for age. Extremities: No clubbing or cyanosis. No edema.  Psoriatic plaque noted. Distal pedal pulses are 2+ and equal bilaterally. Neuro: Alert and oriented X 3. Moves all extremities spontaneously. Psych:  Responds to questions appropriately with a normal affect.    Intake/Output Summary (Last 24 hours) at 09/14/12 1022 Last data filed at 09/13/12 1054  Gross per 24 hour  Intake      3 ml  Output      0 ml  Net      3 ml    Inpatient Medications:  . bisoprolol-hydrochlorothiazide  1 tablet Oral Daily  . clobetasol ointment   Topical BID  . docusate sodium  100 mg Oral QHS  . enoxaparin (LOVENOX) injection  40 mg Subcutaneous Q24H  . irbesartan  150 mg Oral Daily  . levothyroxine  50 mcg Oral QAC breakfast  . Milnacipran  100 mg Oral BID  . pantoprazole  40 mg Oral Daily  . regadenoson  0.4 mg Intravenous Once  . sodium chloride  3 mL Intravenous Q12H  . traMADol  50-100 mg Oral TID    Labs: No results found for this basename: NA, K, CL, CO2, GLUCOSE, BUN, CREATININE, CALCIUM, MG, PHOS,  in the last 72 hours No results found for this  basename: AST, ALT, ALKPHOS, BILITOT, PROT, ALBUMIN,  in the last 72 hours No results found for this basename: WBC, NEUTROABS, HGB, HCT, MCV, PLT,  in the last 72 hours  Recent Labs  09/11/12 1757 09/11/12 2340 09/12/12 0529  TROPONINI <0.30 <0.30 <0.30   No components found with this basename: POCBNP,  No results found for this basename: HGBA1C,  in the last 72 hours  Recent Labs  09/13/12 0517  CHOL 169  HDL 36*  LDLCALC 100*  TRIG 164*  CHOLHDL 4.7    Radiology/Studies:  Dg Chest 2 View 09/11/2012  *RADIOLOGY REPORT*  Clinical Data: Chest pain, nausea, vomiting  CHEST - 2 VIEW  Comparison: 09/11/2012, 12/07/2011  Findings: Normal heart size and vascularity.  Minimal left base atelectasis.  No CHF, pneumonia, collapse, consolidation, effusion or pneumothorax.  Trachea midline.  IMPRESSION: Basilar atelectasis.  No acute process.   Original Report Authenticated By: Jerilynn Mages. Annamaria Boots, M.D.    Dg Chest Port 1 View 09/11/2012  *RADIOLOGY REPORT*  Clinical Data: Chest pain, nausea, vomiting  PORTABLE CHEST - 1 VIEW  Comparison: 12/07/2011; 06/01/2008; chest CT - 07/06/2008  Findings:  Examination is degraded secondary to patient body habitus and portable technique.  Grossly unchanged cardiac silhouette and mediastinal contours given persistently reduced lung volumes. Minimal bibasilar opacity favored to represent atelectasis.  No focal airspace opacity.  No definite pleural effusion or pneumothorax.  No  definite evidence of edema.  Grossly unchanged bones.  IMPRESSION: No definite acute cardiopulmonary disease on this low volume portable AP examination.  Further evaluation with a PA and lateral chest radiograph may be obtained as clinically indicated.   Original Report Authenticated By: Jake Seats, MD    US Abdomen Limited Ruq 09/11/2012  *RADIOLOGY REPORT*  Clinical Data:  Right upper quadrant pain, history gastritis, irritable bowel syndrome, fibromyalgia, hyperlipidemia  LIMITED ABDOMINAL ULTRASOUND -  RIGHT UPPER QUADRANT  Comparison:  None  Findings:  Gallbladder:  Normally distended without stones or wall thickening. No pericholecystic fluid or sonographic Murphy sign.  Common bile duct:  Upper normal caliber 6 mm diameter  Liver:  Echogenic, likely fatty infiltration, though this can be seen with cirrhosis and certain infiltrative disorders.  Suboptimal visualization of intrahepatic detail due to body habitus and increased hepatic echogenicity.  No gross hepatic mass or nodularity identified though intrahepatic pathology cannot completely excluded.  Hepatopetal portal venous flow.  No right upper quadrant ascites identified.  IMPRESSION: Probable fatty infiltration of liver. Suboptimal assessment of intrahepatic detail due to poor sound transmission and body habitus; unable to exclude intrahepatic pathology on this exam. No other upper abdominal sonographic abnormalities identified. If better upper abdominal/hepatic visualization is required recommend CT with IV and oral contrast.                    Original Report Authenticated By: Lavonia Dana, M.D.      Assessment and Plan  1. Chest pain -negative enzymes. D-dimer neg. 2D echo with normal EF and no WMA. Await myoview results (day 2of 2). 2. HTN 3. Morbid obesity BMI 38.2  Signed, Dayna Dunn PA-C The patient is feeling well. She had her second part of the 2 day myoview  Protocol.  Results pending. If normal she will go home today.

## 2012-09-15 ENCOUNTER — Encounter: Payer: Self-pay | Admitting: Family Medicine

## 2012-09-15 MED ORDER — PANTOPRAZOLE SODIUM 40 MG PO TBEC: 40.0000 mg | DELAYED_RELEASE_TABLET | Freq: Every day | ORAL | Status: DC

## 2012-09-15 NOTE — Discharge Summary (Signed)
Physician Discharge Summary  Yolanda [name] SWH:[id number] DOB: 11-15-1955 DOA: 09/11/2012  PCP: Purvis Kilts, MD  Admit date: 09/11/2012 Discharge date: 09/15/2012  Time spent: 20 minutes  Recommendations for Outpatient Follow-up:  1. Follow up with PCP 2. Consider out-patient re-evaluation by cardiology if these issues persist  Discharge Diagnoses:  Principal Problem:   Precordial pain Active Problems:   HYPERLIPIDEMIA   DEPRESSION   HYPERTENSION   GERD   SLEEP APNEA   Obesity, morbid   Hypothyroidism   ADHD (attention deficit hyperactivity disorder)   Anxiety   Discharge Condition: Good  Diet recommendation:  Heart Healthy low salt  Filed Weights   09/11/12 0932 09/11/12 1628 09/12/12 0524  Weight: 98.884 kg (218 lb) 100.8 kg (222 lb 3.6 oz) 103.012 kg (227 lb 1.6 oz)    History of present illness:  Precordial pain: Multiple risk factors and her chest pain is concerning given description of arm numbness and pressure. She has negative cardiac markers and no acute EKG changes. Due to history and risk factors, there are concerns for underlying angina. She has been followed by Cardiology. She had resting images for stress test yesterday, but was unable to complete the test today due to lack of services at [facility]. Plan Discussed with Dr. Johnsie Cancel and patient will be transferred to [facility] for further stress testing and cardiac cath if necessary.  Stress test performed, Results from stress imaging 3/8 showed no reversible acute deficit-no furthe rwork-up at present per Cardiology, who cleared patient for d/c  HYPERLIPIDEMIA: LDL is borderline at 100. Continue statin.  DEPRESSION: Continue home meds  HYPERTENSION: Continue home meds  GERD  SLEEP APNEA  Obesity, morbid  Hypothyroidism: Continue Synthroid  ADHD (attention deficit hyperactivity disorder)  Anxiety: Continue Xanax  Procedures: Stress Myoview  IMPRESSION:  1. No fixed or reversible defects to  suggest inducible ischemia.  2. Normal left ventricular wall motion and thickening.  3. Normal ejection fraction.   Consultations:  Cardiology  Discharge Exam: Filed Vitals:   09/14/12 1404 09/14/12 1443 09/14/12 2203 09/15/12 0600  BP: 143/80 120/77 124/78 98/68  Pulse: 78 80 70 71  Temp:  97.9 F (36.6 C) 98.8 F (37.1 C) 97.4 F (36.3 C)  TempSrc:  Oral Oral   Resp:  18 18 18   Height:      Weight:      SpO2:  94% 95% 95%   Doing well No specific complaints currently for CP or SOB no n/v  General: Alert EOMI Cardiovascular:  S1 s2 no m/r/g Respiratory:  cta b, no added sound  Discharge Instructions     Medication List    STOP taking these medications       polyethylene glycol packet  Commonly known as:  MIRALAX / GLYCOLAX      TAKE these medications       ALPRAZolam 1 MG tablet  Commonly known as:  XANAX  Take 1 mg by mouth 3 (three) times daily as needed. Anxiety     bisoprolol-hydrochlorothiazide 5-6.25 MG per tablet  Commonly known as:  ZIAC  Take 1 tablet by mouth daily.     docusate sodium 100 MG capsule  Commonly known as:  COLACE  Take 100 mg by mouth at bedtime.     fish oil-omega-3 fatty acids 1000 MG capsule  Take 1 g by mouth daily.     halobetasol 0.05 % cream  Commonly known as:  ULTRAVATE  Apply 1 application topically 2 (two) times daily.  ibuprofen 200 MG tablet  Commonly known as:  ADVIL,MOTRIN  Take 200 mg by mouth every 6 (six) hours as needed for pain.     levothyroxine 50 MCG tablet  Commonly known as:  SYNTHROID, LEVOTHROID  Take 50 mcg by mouth daily.     MAGNESIUM PO  Take 1 tablet by mouth daily.     meclizine 25 MG tablet  Commonly known as:  ANTIVERT  Take 25 mg by mouth 3 (three) times daily as needed. Dizziness     methylphenidate 36 MG CR tablet  Commonly known as:  CONCERTA  Take 36 mg by mouth daily.     pantoprazole 40 MG tablet  Commonly known as:  PROTONIX  Take 1 tablet (40 mg total) by mouth  daily.     SAVELLA 100 MG Tabs tablet  Generic drug:  Milnacipran HCl  Take 100 mg by mouth 2 (two) times daily.     telmisartan 40 MG tablet  Commonly known as:  MICARDIS  Take 40 mg by mouth daily.     traMADol 50 MG tablet  Commonly known as:  ULTRAM  Take 50-100 mg by mouth 3 (three) times daily.     TURMERIC PO  Take 1 tablet by mouth daily.     Vitamin D 2000 UNITS Caps  Take 1 capsule by mouth daily.           Follow-up Information   Follow up with Purvis Kilts, MD In 1 week.   Contact information:   [address] [place] [address] [phone]        The results of significant diagnostics from this hospitalization (including imaging, microbiology, ancillary and laboratory) are listed below for reference.    Significant Diagnostic Studies: Dg Chest 2 View  09/11/2012  *RADIOLOGY REPORT*  Clinical Data: Chest pain, nausea, vomiting  CHEST - 2 VIEW  Comparison: 09/11/2012, 12/07/2011  Findings: Normal heart size and vascularity.  Minimal left base atelectasis.  No CHF, pneumonia, collapse, consolidation, effusion or pneumothorax.  Trachea midline.  IMPRESSION: Basilar atelectasis.  No acute process.   Original Report Authenticated By: Jerilynn Mages. Annamaria Boots, M.D.    Nm Myocar Multi W/spect W/wall Motion / Ef  09/14/2012  *RADIOLOGY REPORT*  Clinical Data:  Chest pain.  MYOCARDIAL IMAGING WITH SPECT (REST AND PHARMACOLOGIC-STRESS) GATED LEFT VENTRICULAR WALL MOTION STUDY LEFT VENTRICULAR EJECTION FRACTION  Standard myocardial SPECT imaging was performed after resting intravenous injection of 10.3mi [id number].  Subsequently, intravenous infusion of Lexiscan  was performed under the supervision of cardiology staff.  At peak effect of the drug, 30.66m37mof Tc-57m766mrofosmin was injected intravenously and standard myocardial SPECT imaging was performed.  Quantitative gated imaging was also performed to evaluate left ventricular wall motion and  estimated left ventricular ejection fraction.  Comparison:  Plain film of 09/11/2012  Findings:  Rest images demonstrate normal left ventricular cavity size. No fixed defect.  Stress images demonstrate no areas of reversibility to suggest inducible ischemia.  Evaluation of wall motion demonstrates normal wall motion and thickening.  Ejection fraction is estimated at 71%.  End diastolic volume of 57 cc.  End systolic volume of  16 cc.  IMPRESSION: 1.  No fixed or reversible defects to suggest inducible ischemia. 2.  Normal left ventricular wall motion and thickening. 3.  Normal ejection fraction.   Original Report Authenticated By: KyleAbigail MiyamotoD.    Dg Chest Port 1 View  09/11/2012  *RADIOLOGY REPORT*  Clinical  Data: Chest pain, nausea, vomiting  PORTABLE CHEST - 1 VIEW  Comparison: 12/07/2011; 06/01/2008; chest CT - 07/06/2008  Findings:  Examination is degraded secondary to patient body habitus and portable technique.  Grossly unchanged cardiac silhouette and mediastinal contours given persistently reduced lung volumes. Minimal bibasilar opacity favored to represent atelectasis.  No focal airspace opacity.  No definite pleural effusion or pneumothorax.  No definite evidence of edema.  Grossly unchanged bones.  IMPRESSION: No definite acute cardiopulmonary disease on this low volume portable AP examination.  Further evaluation with a PA and lateral chest radiograph may be obtained as clinically indicated.   Original Report Authenticated By: Jake Seats, MD    US Abdomen Limited Ruq  09/11/2012  *RADIOLOGY REPORT*  Clinical Data:  Right upper quadrant pain, history gastritis, irritable bowel syndrome, fibromyalgia, hyperlipidemia  LIMITED ABDOMINAL ULTRASOUND - RIGHT UPPER QUADRANT  Comparison:  None  Findings:  Gallbladder:  Normally distended without stones or wall thickening. No pericholecystic fluid or sonographic Murphy sign.  Common bile duct:  Upper normal caliber 6 mm diameter  Liver:  Echogenic,  likely fatty infiltration, though this can be seen with cirrhosis and certain infiltrative disorders.  Suboptimal visualization of intrahepatic detail due to body habitus and increased hepatic echogenicity.  No gross hepatic mass or nodularity identified though intrahepatic pathology cannot completely excluded.  Hepatopetal portal venous flow.  No right upper quadrant ascites identified.  IMPRESSION: Probable fatty infiltration of liver. Suboptimal assessment of intrahepatic detail due to poor sound transmission and body habitus; unable to exclude intrahepatic pathology on this exam. No other upper abdominal sonographic abnormalities identified. If better upper abdominal/hepatic visualization is required recommend CT with IV and oral contrast.                    Original Report Authenticated By: Lavonia Dana, M.D.     Microbiology: No results found for this or any previous visit (from the past 240 hour(s)).   Labs: Basic Metabolic Panel:  Recent Labs Lab 09/11/12 0935  NA 141  K 3.5  CL 103  CO2 30  GLUCOSE 80  BUN 11  CREATININE 0.67  CALCIUM 9.3   Liver Function Tests:  Recent Labs Lab 09/11/12 0952  AST 22  ALT 22  ALKPHOS 90  BILITOT 0.4  PROT 7.8  ALBUMIN 3.5    Recent Labs Lab 09/11/12 0950  LIPASE 38   No results found for this basename: AMMONIA,  in the last 168 hours CBC:  Recent Labs Lab 09/11/12 0935  WBC 9.5  HGB 14.8  HCT 43.3  MCV 91.0  PLT 239   Cardiac Enzymes:  Recent Labs Lab 09/11/12 0935 09/11/12 1757 09/11/12 2340 09/12/12 0529  TROPONINI <0.30 <0.30 <0.30 <0.30   BNP: BNP (last 3 results) No results found for this basename: PROBNP,  in the last 8760 hours CBG: No results found for this basename: GLUCAP,  in the last 168 hours     Signed:  Nita Sells  [facility] 09/15/2012, 9:01 AM

## 2012-09-16 ENCOUNTER — Encounter (HOSPITAL_COMMUNITY): Payer: PRIVATE HEALTH INSURANCE

## 2012-09-16 ENCOUNTER — Encounter (HOSPITAL_COMMUNITY): Payer: PRIVATE HEALTH INSURANCE | Attending: Adult Health

## 2012-09-18 ENCOUNTER — Ambulatory Visit (INDEPENDENT_AMBULATORY_CARE_PROVIDER_SITE_OTHER): Payer: Self-pay | Admitting: Psychology

## 2012-09-18 DIAGNOSIS — F329 Major depressive disorder, single episode, unspecified: Secondary | ICD-10-CM

## 2012-09-18 DIAGNOSIS — F4310 Post-traumatic stress disorder, unspecified: Secondary | ICD-10-CM

## 2012-10-03 ENCOUNTER — Ambulatory Visit (INDEPENDENT_AMBULATORY_CARE_PROVIDER_SITE_OTHER): Payer: PRIVATE HEALTH INSURANCE | Admitting: Psychology

## 2012-10-03 DIAGNOSIS — F4312 Post-traumatic stress disorder, chronic: Secondary | ICD-10-CM

## 2012-10-03 DIAGNOSIS — F329 Major depressive disorder, single episode, unspecified: Secondary | ICD-10-CM

## 2012-10-03 DIAGNOSIS — F4310 Post-traumatic stress disorder, unspecified: Secondary | ICD-10-CM

## 2012-10-07 ENCOUNTER — Encounter (HOSPITAL_COMMUNITY): Payer: Self-pay | Admitting: Psychology
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
## 2012-10-16 ENCOUNTER — Encounter (HOSPITAL_COMMUNITY): Payer: Self-pay | Admitting: Psychology
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
## 2012-11-01 ENCOUNTER — Ambulatory Visit (INDEPENDENT_AMBULATORY_CARE_PROVIDER_SITE_OTHER): Payer: PRIVATE HEALTH INSURANCE | Admitting: Psychology

## 2012-11-01 DIAGNOSIS — F329 Major depressive disorder, single episode, unspecified: Secondary | ICD-10-CM

## 2012-11-01 DIAGNOSIS — F4312 Post-traumatic stress disorder, chronic: Secondary | ICD-10-CM

## 2012-11-01 DIAGNOSIS — F4310 Post-traumatic stress disorder, unspecified: Secondary | ICD-10-CM

## 2012-11-19 ENCOUNTER — Encounter (HOSPITAL_COMMUNITY): Payer: Self-pay | Admitting: Psychology

## 2012-11-19 NOTE — Progress Notes (Deleted)
Psychiatric Assessment Adult  Patient Identification:  NIZHONI PARLOW Date of Evaluation:  11/19/2012 Chief Complaint: *** History of Chief Complaint:   Chief Complaint  Patient presents with  . Anxiety  . Depression  . Stress  . Trauma    HPI Review of Systems Physical Exam  Depressive Symptoms: {DEPRESSION SYMPTOMS:20000}  (Hypo) Manic Symptoms:   Elevated Mood:  {BHH YES OR NO:22294} Irritable Mood:  {BHH YES OR NO:22294} Grandiosity:  {BHH YES OR NO:22294} Distractibility:  {BHH YES OR NO:22294} Labiality of Mood:  {BHH YES OR NO:22294} Delusions:  {BHH YES OR NO:22294} Hallucinations:  {BHH YES OR NO:22294} Impulsivity:  {BHH YES OR NO:22294} Sexually Inappropriate Behavior:  {BHH YES OR NO:22294} Financial Extravagance:  {BHH YES OR NO:22294} Flight of Ideas:  {BHH YES OR NO:22294}  Anxiety Symptoms: Excessive Worry:  {BHH YES OR NO:22294} Panic Symptoms:  {BHH YES OR NO:22294} Agoraphobia:  {BHH YES OR NO:22294} Obsessive Compulsive: {BHH YES OR NO:22294}  Symptoms: {Obsessive Compulsive Symptoms:22671} Specific Phobias:  {BHH YES OR NO:22294} Social Anxiety:  {BHH YES OR NO:22294}  Psychotic Symptoms:  Hallucinations: {BHH YES OR NO:22294} {Hallucinations:22672} Delusions:  {BHH YES OR NO:22294} Paranoia:  {BHH YES OR NO:22294}   Ideas of Reference:  {BHH YES OR NO:22294}  PTSD Symptoms: Ever had a traumatic exposure:  {BHH YES OR NO:22294} Had a traumatic exposure in the last month:  {BHH YES OR NO:22294} Re-experiencing: {BHH YES OR NO:22294} {Re-experiencing:22673} Hypervigilance:  {BHH YES OR NO:22294} Hyperarousal: {BHH YES OR NO:22294} {Hyperarousal:22674} Avoidance: {BHH YES OR NO:22294} {Avoidance:22675}  Traumatic Brain Injury: {BHH YES OR NO:22294} {Traumatic Brain Injury:22676}  Past Psychiatric History: Diagnosis: ***  Hospitalizations: ***  Outpatient Care: ***  Substance Abuse Care: ***  Self-Mutilation: ***  Suicidal Attempts:  ***  Violent Behaviors: ***   Past Medical History:   Past Medical History  Diagnosis Date  . Fibromyalgia   . Hypertension   . PE (pulmonary embolism)   . Depression   . Chronic pain in left foot   . Chronic abdominal pain   . IBS (irritable bowel syndrome)   . Gastritis   . Internal hemorrhoids   . Kidney stone   . Thyroid disease   . Sleep apnea    History of Loss of Consciousness:  {BHH YES OR NO:22294} Seizure History:  {BHH YES OR NO:22294} Cardiac History:  {BHH YES OR NO:22294} Allergies:   Allergies  Allergen Reactions  . Sulfonamide Derivatives Other (See Comments)    Vaginal Infection    Current Medications:  Current Outpatient Prescriptions  Medication Sig Dispense Refill  . ALPRAZolam (XANAX) 1 MG tablet Take 1 mg by mouth 3 (three) times daily as needed. Anxiety      . bisoprolol-hydrochlorothiazide (ZIAC) 5-6.25 MG per tablet Take 1 tablet by mouth daily.      . Cholecalciferol (VITAMIN D) 2000 UNITS CAPS Take 1 capsule by mouth daily.      Marland Kitchen docusate sodium (COLACE) 100 MG capsule Take 100 mg by mouth at bedtime.      . fish oil-omega-3 fatty acids 1000 MG capsule Take 1 g by mouth daily.      . halobetasol (ULTRAVATE) 0.05 % cream Apply 1 application topically 2 (two) times daily.      Marland Kitchen ibuprofen (ADVIL,MOTRIN) 200 MG tablet Take 200 mg by mouth every 6 (six) hours as needed for pain.      Marland Kitchen levothyroxine (SYNTHROID, LEVOTHROID) 50 MCG tablet Take 50 mcg by mouth daily.      Marland Kitchen  MAGNESIUM PO Take 1 tablet by mouth daily.      . meclizine (ANTIVERT) 25 MG tablet Take 25 mg by mouth 3 (three) times daily as needed. Dizziness      . methylphenidate (CONCERTA) 36 MG CR tablet Take 36 mg by mouth daily.      . Milnacipran HCl (SAVELLA) 100 MG TABS tablet Take 100 mg by mouth 2 (two) times daily.      . pantoprazole (PROTONIX) 40 MG tablet Take 1 tablet (40 mg total) by mouth daily.  30 tablet  0  . telmisartan (MICARDIS) 40 MG tablet Take 40 mg by mouth daily.       . traMADol (ULTRAM) 50 MG tablet Take 50-100 mg by mouth 3 (three) times daily.      . TURMERIC PO Take 1 tablet by mouth daily.       No current facility-administered medications for this visit.    Previous Psychotropic Medications:  Medication Dose   ***  ***                     Substance Abuse History in the last 12 months: Substance Age of 1st Use Last Use Amount Specific Type  Nicotine  ***  ***  ***  ***  Alcohol  ***  ***  ***  ***  Cannabis  ***  ***  ***  ***  Opiates  ***  ***  ***  ***  Cocaine  ***  ***  ***  ***  Methamphetamines  ***  ***  ***  ***  LSD  ***  ***  ***  ***  Ecstasy  ***   ***  ***  ***  Benzodiazepines  ***  ***  ***  ***  Caffeine  ***  ***  ***  ***  Inhalants  ***  ***  ***  ***  Others:                          Medical Consequences of Substance Abuse: ***  Legal Consequences of Substance Abuse: ***  Family Consequences of Substance Abuse: ***  Blackouts:  {BHH YES OR NO:22294} DT's:  {BHH YES OR NO:22294} Withdrawal Symptoms:  {BHH YES OR NO:22294} {Withdrawal Symptoms:22677}  Social History: Current Place of Residence: *** Place of Birth: *** Family Members: *** Marital Status:  {Marital Status:22678} Children: ***  Sons: ***  Daughters: *** Relationships: *** Education:  {Education:22679} Educational Problems/Performance: *** Religious Beliefs/Practices: *** History of Abuse: {Desc; abuse:16542} Occupational Experiences; Military History:  {Military History:22680} Legal History: *** Hobbies/Interests: ***  Family History:   Family History  Problem Relation Age of Onset  . Heart attack Mother     CABG  . Heart attack Father     CABG  . Heart attack Brother   . Stroke Mother   . Hypertension Mother   . Hypertension Father     Mental Status Examination/Evaluation: Objective:  Appearance: {Appearance:22683}  Eye Contact::  {BHH EYE CONTACT:22684}  Speech:  {Speech:22685}  Volume:  {Volume  (PAA):22686}  Mood:  ***  Affect:  {Affect (PAA):22687}  Thought Process:  {Thought Process (PAA):22688}  Orientation:  {BHH ORIENTATION (PAA):22689}  Thought Content:  {Thought Content:22690}  Suicidal Thoughts:  {ST/HT (PAA):22692}  Homicidal Thoughts:  {ST/HT (PAA):22692}  Judgement:  {Judgement (PAA):22694}  Insight:  {Insight (PAA):22695}  Psychomotor Activity:  {Psychomotor (PAA):22696}  Akathisia:  {BHH YES OR NO:22294}  Handed:  {Handed:22697}  AIMS (if indicated):  ***  Assets:  {Assets (PAA):22698}    Laboratory/X-Ray Psychological Evaluation(s)   ***  ***   Assessment:  {axis diagnosis:3049000}  AXIS I {psych axis 1:31909}  AXIS II {psych axis 2:31910}  AXIS III Past Medical History  Diagnosis Date  . Fibromyalgia   . Hypertension   . PE (pulmonary embolism)   . Depression   . Chronic pain in left foot   . Chronic abdominal pain   . IBS (irritable bowel syndrome)   . Gastritis   . Internal hemorrhoids   . Kidney stone   . Thyroid disease   . Sleep apnea      AXIS IV {psych axis iv:31915}  AXIS V {psych axis v score:31919}   Treatment Plan/Recommendations:  Plan of Care: ***  Laboratory:  {Laboratory:22682}  Psychotherapy: ***  Medications: ***  Routine PRN Medications:  {BHH YES OR NO:22294}  Consultations: ***  Safety Concerns:  ***  Other:      Edgardo Roys, PsyD 5/13/201410:37 AM
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
## 2012-12-04 ENCOUNTER — Ambulatory Visit (HOSPITAL_COMMUNITY): Payer: Self-pay | Admitting: Psychology

## 2012-12-05 ENCOUNTER — Ambulatory Visit (INDEPENDENT_AMBULATORY_CARE_PROVIDER_SITE_OTHER): Payer: PRIVATE HEALTH INSURANCE | Admitting: Psychology

## 2012-12-05 DIAGNOSIS — F329 Major depressive disorder, single episode, unspecified: Secondary | ICD-10-CM

## 2012-12-05 DIAGNOSIS — F4312 Post-traumatic stress disorder, chronic: Secondary | ICD-10-CM

## 2012-12-05 DIAGNOSIS — F4310 Post-traumatic stress disorder, unspecified: Secondary | ICD-10-CM

## 2012-12-19 ENCOUNTER — Encounter (HOSPITAL_COMMUNITY): Payer: Self-pay | Admitting: Psychology
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
## 2012-12-30 ENCOUNTER — Ambulatory Visit (INDEPENDENT_AMBULATORY_CARE_PROVIDER_SITE_OTHER): Payer: PRIVATE HEALTH INSURANCE | Admitting: Psychology

## 2012-12-30 DIAGNOSIS — F4310 Post-traumatic stress disorder, unspecified: Secondary | ICD-10-CM

## 2012-12-30 DIAGNOSIS — F329 Major depressive disorder, single episode, unspecified: Secondary | ICD-10-CM

## 2012-12-30 DIAGNOSIS — F4312 Post-traumatic stress disorder, chronic: Secondary | ICD-10-CM

## 2013-01-23 ENCOUNTER — Ambulatory Visit (INDEPENDENT_AMBULATORY_CARE_PROVIDER_SITE_OTHER): Payer: PRIVATE HEALTH INSURANCE | Admitting: Psychology

## 2013-01-23 ENCOUNTER — Encounter (HOSPITAL_COMMUNITY): Payer: Self-pay | Admitting: Psychology

## 2013-01-23 DIAGNOSIS — F329 Major depressive disorder, single episode, unspecified: Secondary | ICD-10-CM

## 2013-01-23 DIAGNOSIS — F4312 Post-traumatic stress disorder, chronic: Secondary | ICD-10-CM

## 2013-01-23 DIAGNOSIS — F4310 Post-traumatic stress disorder, unspecified: Secondary | ICD-10-CM
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
## 2013-02-03 ENCOUNTER — Encounter (HOSPITAL_COMMUNITY): Payer: Self-pay | Admitting: Psychology
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
## 2013-02-21 ENCOUNTER — Ambulatory Visit (INDEPENDENT_AMBULATORY_CARE_PROVIDER_SITE_OTHER): Payer: PRIVATE HEALTH INSURANCE | Admitting: Psychology

## 2013-02-21 DIAGNOSIS — F4310 Post-traumatic stress disorder, unspecified: Secondary | ICD-10-CM

## 2013-02-21 DIAGNOSIS — F329 Major depressive disorder, single episode, unspecified: Secondary | ICD-10-CM

## 2013-02-21 DIAGNOSIS — F4312 Post-traumatic stress disorder, chronic: Secondary | ICD-10-CM

## 2013-02-24 ENCOUNTER — Encounter (HOSPITAL_COMMUNITY): Payer: Self-pay | Admitting: Psychology
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
## 2013-03-18 ENCOUNTER — Ambulatory Visit (INDEPENDENT_AMBULATORY_CARE_PROVIDER_SITE_OTHER): Payer: PRIVATE HEALTH INSURANCE | Admitting: Psychology

## 2013-03-18 DIAGNOSIS — F329 Major depressive disorder, single episode, unspecified: Secondary | ICD-10-CM

## 2013-03-18 DIAGNOSIS — F4312 Post-traumatic stress disorder, chronic: Secondary | ICD-10-CM

## 2013-03-18 DIAGNOSIS — F4310 Post-traumatic stress disorder, unspecified: Secondary | ICD-10-CM

## 2013-05-15 ENCOUNTER — Other Ambulatory Visit: Payer: Self-pay

## 2013-05-23 ENCOUNTER — Encounter (HOSPITAL_COMMUNITY): Payer: Self-pay | Admitting: Psychology
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
## 2013-05-26 ENCOUNTER — Ambulatory Visit (INDEPENDENT_AMBULATORY_CARE_PROVIDER_SITE_OTHER): Payer: PRIVATE HEALTH INSURANCE | Admitting: Psychology

## 2013-05-26 DIAGNOSIS — F329 Major depressive disorder, single episode, unspecified: Secondary | ICD-10-CM

## 2013-05-26 DIAGNOSIS — F4310 Post-traumatic stress disorder, unspecified: Secondary | ICD-10-CM

## 2013-05-26 DIAGNOSIS — F4312 Post-traumatic stress disorder, chronic: Secondary | ICD-10-CM

## 2013-05-27 ENCOUNTER — Encounter (HOSPITAL_COMMUNITY): Payer: Self-pay | Admitting: Psychology
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
## 2013-07-21 ENCOUNTER — Encounter (HOSPITAL_COMMUNITY): Payer: Self-pay | Admitting: Psychology

## 2013-07-21 ENCOUNTER — Ambulatory Visit (INDEPENDENT_AMBULATORY_CARE_PROVIDER_SITE_OTHER): Payer: PRIVATE HEALTH INSURANCE | Admitting: Psychology

## 2013-07-21 DIAGNOSIS — F329 Major depressive disorder, single episode, unspecified: Secondary | ICD-10-CM

## 2013-07-21 DIAGNOSIS — F4312 Post-traumatic stress disorder, chronic: Secondary | ICD-10-CM

## 2013-07-21 DIAGNOSIS — F4310 Post-traumatic stress disorder, unspecified: Secondary | ICD-10-CM
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
## 2013-09-18 ENCOUNTER — Encounter (HOSPITAL_COMMUNITY): Payer: Self-pay | Admitting: Psychology

## 2013-09-18 ENCOUNTER — Ambulatory Visit (INDEPENDENT_AMBULATORY_CARE_PROVIDER_SITE_OTHER): Payer: PRIVATE HEALTH INSURANCE | Admitting: Psychology

## 2013-09-18 DIAGNOSIS — F4310 Post-traumatic stress disorder, unspecified: Secondary | ICD-10-CM

## 2013-09-18 DIAGNOSIS — F4312 Post-traumatic stress disorder, chronic: Secondary | ICD-10-CM

## 2013-09-18 DIAGNOSIS — F329 Major depressive disorder, single episode, unspecified: Secondary | ICD-10-CM
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
## 2013-11-18 ENCOUNTER — Other Ambulatory Visit (HOSPITAL_COMMUNITY): Payer: Self-pay | Admitting: Family Medicine

## 2013-11-18 ENCOUNTER — Ambulatory Visit (HOSPITAL_COMMUNITY)
Admission: RE | Admit: 2013-11-18 | Discharge: 2013-11-18 | Disposition: A | Discharge status: Home / Self Care | Payer: PRIVATE HEALTH INSURANCE | Source: Ambulatory Visit | Attending: Family Medicine | Admitting: Family Medicine

## 2013-11-18 ENCOUNTER — Emergency Department (HOSPITAL_COMMUNITY)
Admission: EM | Admit: 2013-11-18 | Discharge: 2013-11-18 | Disposition: A | Discharge status: Home / Self Care | Payer: PRIVATE HEALTH INSURANCE | Attending: Emergency Medicine | Admitting: Emergency Medicine

## 2013-11-18 ENCOUNTER — Encounter (HOSPITAL_COMMUNITY): Payer: Self-pay

## 2013-11-18 ENCOUNTER — Encounter (HOSPITAL_COMMUNITY): Payer: Self-pay | Admitting: Emergency Medicine

## 2013-11-18 DIAGNOSIS — R1013 Epigastric pain: Secondary | ICD-10-CM | POA: Insufficient documentation

## 2013-11-18 DIAGNOSIS — Z79899 Other long term (current) drug therapy: Secondary | ICD-10-CM | POA: Insufficient documentation

## 2013-11-18 DIAGNOSIS — Z86711 Personal history of pulmonary embolism: Secondary | ICD-10-CM | POA: Insufficient documentation

## 2013-11-18 DIAGNOSIS — Z87442 Personal history of urinary calculi: Secondary | ICD-10-CM | POA: Insufficient documentation

## 2013-11-18 DIAGNOSIS — Z9889 Other specified postprocedural states: Secondary | ICD-10-CM | POA: Insufficient documentation

## 2013-11-18 DIAGNOSIS — IMO0002 Reserved for concepts with insufficient information to code with codable children: Secondary | ICD-10-CM | POA: Insufficient documentation

## 2013-11-18 DIAGNOSIS — K2970 Gastritis, unspecified, without bleeding: Secondary | ICD-10-CM | POA: Insufficient documentation

## 2013-11-18 DIAGNOSIS — K6389 Other specified diseases of intestine: Secondary | ICD-10-CM | POA: Insufficient documentation

## 2013-11-18 DIAGNOSIS — K529 Noninfective gastroenteritis and colitis, unspecified: Secondary | ICD-10-CM

## 2013-11-18 DIAGNOSIS — R1012 Left upper quadrant pain: Secondary | ICD-10-CM | POA: Insufficient documentation

## 2013-11-18 DIAGNOSIS — I10 Essential (primary) hypertension: Secondary | ICD-10-CM | POA: Insufficient documentation

## 2013-11-18 DIAGNOSIS — K2990 Gastroduodenitis, unspecified, without bleeding: Secondary | ICD-10-CM

## 2013-11-18 DIAGNOSIS — Z87891 Personal history of nicotine dependence: Secondary | ICD-10-CM | POA: Insufficient documentation

## 2013-11-18 DIAGNOSIS — R1032 Left lower quadrant pain: Secondary | ICD-10-CM | POA: Insufficient documentation

## 2013-11-18 DIAGNOSIS — IMO0001 Reserved for inherently not codable concepts without codable children: Secondary | ICD-10-CM | POA: Insufficient documentation

## 2013-11-18 DIAGNOSIS — F3289 Other specified depressive episodes: Secondary | ICD-10-CM | POA: Insufficient documentation

## 2013-11-18 DIAGNOSIS — R109 Unspecified abdominal pain: Secondary | ICD-10-CM

## 2013-11-18 DIAGNOSIS — E079 Disorder of thyroid, unspecified: Secondary | ICD-10-CM | POA: Insufficient documentation

## 2013-11-18 DIAGNOSIS — K7689 Other specified diseases of liver: Secondary | ICD-10-CM | POA: Insufficient documentation

## 2013-11-18 DIAGNOSIS — N200 Calculus of kidney: Secondary | ICD-10-CM | POA: Insufficient documentation

## 2013-11-18 DIAGNOSIS — F329 Major depressive disorder, single episode, unspecified: Secondary | ICD-10-CM | POA: Insufficient documentation

## 2013-11-18 DIAGNOSIS — R000 Tachycardia, unspecified: Secondary | ICD-10-CM | POA: Insufficient documentation

## 2013-11-18 DIAGNOSIS — G8929 Other chronic pain: Secondary | ICD-10-CM | POA: Insufficient documentation

## 2013-11-18 LAB — COMPREHENSIVE METABOLIC PANEL
ALBUMIN: 3.4 g/dL — AB (ref 3.5–5.2)
ALK PHOS: 117 U/L (ref 39–117)
ALT: 27 U/L (ref 0–35)
AST: 29 U/L (ref 0–37)
BILIRUBIN TOTAL: 0.3 mg/dL (ref 0.3–1.2)
BUN: 10 mg/dL (ref 6–23)
CHLORIDE: 101 meq/L (ref 96–112)
CO2: 26 meq/L (ref 19–32)
Calcium: 9.1 mg/dL (ref 8.4–10.5)
Creatinine, Ser: 0.63 mg/dL (ref 0.50–1.10)
GLUCOSE: 96 mg/dL (ref 70–99)
POTASSIUM: 3.8 meq/L (ref 3.7–5.3)
Sodium: 140 mEq/L (ref 137–147)
Total Protein: 7.5 g/dL (ref 6.0–8.3)

## 2013-11-18 LAB — CBC WITH DIFFERENTIAL/PLATELET
Basophils Absolute: 0 10*3/uL (ref 0.0–0.1)
Basophils Relative: 0 % (ref 0–1)
Eosinophils Absolute: 0.1 10*3/uL (ref 0.0–0.7)
Eosinophils Relative: 1 % (ref 0–5)
HEMATOCRIT: 43.1 % (ref 36.0–46.0)
HEMOGLOBIN: 14.7 g/dL (ref 12.0–15.0)
LYMPHS ABS: 3 10*3/uL (ref 0.7–4.0)
Lymphocytes Relative: 29 % (ref 12–46)
MCH: 30.6 pg (ref 26.0–34.0)
MCHC: 34.1 g/dL (ref 30.0–36.0)
MCV: 89.8 fL (ref 78.0–100.0)
MONO ABS: 0.6 10*3/uL (ref 0.1–1.0)
MONOS PCT: 6 % (ref 3–12)
NEUTROS ABS: 6.9 10*3/uL (ref 1.7–7.7)
NEUTROS PCT: 64 % (ref 43–77)
Platelets: 283 10*3/uL (ref 150–400)
RBC: 4.8 MIL/uL (ref 3.87–5.11)
RDW: 12.7 % (ref 11.5–15.5)
WBC: 10.6 10*3/uL — ABNORMAL HIGH (ref 4.0–10.5)

## 2013-11-18 LAB — LIPASE, BLOOD: LIPASE: 29 U/L (ref 11–59)

## 2013-11-18 MED ORDER — KETOROLAC TROMETHAMINE 30 MG/ML IJ SOLN
30.0000 mg | Freq: Once | INTRAMUSCULAR | Status: AC
  Administered 2013-11-18: 30 mg via INTRAVENOUS
  Filled 2013-11-18: qty 1

## 2013-11-18 MED ORDER — IOHEXOL 300 MG/ML  SOLN
100.0000 mL | Freq: Once | INTRAMUSCULAR | Status: AC | PRN
  Administered 2013-11-18: 100 mL via INTRAVENOUS

## 2013-11-18 MED ORDER — METRONIDAZOLE 500 MG PO TABS: 500.0000 mg | ORAL_TABLET | Freq: Three times a day (TID) | ORAL | Status: DC

## 2013-11-18 MED ORDER — CIPROFLOXACIN HCL 500 MG PO TABS: 500.0000 mg | ORAL_TABLET | Freq: Two times a day (BID) | ORAL | Status: DC

## 2013-11-18 MED ORDER — CIPROFLOXACIN IN D5W 400 MG/200ML IV SOLN
400.0000 mg | Freq: Once | INTRAVENOUS | Status: AC
  Administered 2013-11-18: 400 mg via INTRAVENOUS
  Filled 2013-11-18: qty 200

## 2013-11-18 MED ORDER — ONDANSETRON HCL 4 MG/2ML IJ SOLN
4.0000 mg | Freq: Once | INTRAMUSCULAR | Status: AC
  Administered 2013-11-18: 4 mg via INTRAVENOUS
  Filled 2013-11-18: qty 2

## 2013-11-18 MED ORDER — MORPHINE SULFATE 4 MG/ML IJ SOLN
4.0000 mg | Freq: Once | INTRAMUSCULAR | Status: AC
  Administered 2013-11-18: 4 mg via INTRAVENOUS
  Filled 2013-11-18: qty 1

## 2013-11-18 MED ORDER — SODIUM CHLORIDE 0.9 % IV BOLUS (SEPSIS)
500.0000 mL | Freq: Once | INTRAVENOUS | Status: AC
  Administered 2013-11-18: 500 mL via INTRAVENOUS

## 2013-11-18 MED ORDER — OXYCODONE-ACETAMINOPHEN 5-325 MG PO TABS: 2.0000 | ORAL_TABLET | ORAL | Status: DC | PRN

## 2013-11-18 NOTE — Discharge Instructions (Signed)
Abdominal Pain, Adult °Many things can cause abdominal pain. Usually, abdominal pain is not caused by a disease and will improve without treatment. It can often be observed and treated at home. Your health care provider will do a physical exam and possibly order blood tests and X-rays to help determine the seriousness of your pain. However, in many cases, more time must pass before a clear cause of the pain can be found. Before that point, your health care provider may not know if you need more testing or further treatment. °HOME CARE INSTRUCTIONS  °Monitor your abdominal pain for any changes. The following actions may help to alleviate any discomfort you are experiencing: °· Only take over-the-counter or prescription medicines as directed by your health care provider. °· Do not take laxatives unless directed to do so by your health care provider. °· Try a clear liquid diet (broth, tea, or water) as directed by your health care provider. Slowly move to a bland diet as tolerated. °SEEK MEDICAL CARE IF: °· You have unexplained abdominal pain. °· You have abdominal pain associated with nausea or diarrhea. °· You have pain when you urinate or have a bowel movement. °· You experience abdominal pain that wakes you in the night. °· You have abdominal pain that is worsened or improved by eating food. °· You have abdominal pain that is worsened with eating fatty foods. °SEEK IMMEDIATE MEDICAL CARE IF:  °· Your pain does not go away within 2 hours. °· You have a fever. °· You keep throwing up (vomiting). °· Your pain is felt only in portions of the abdomen, such as the right side or the left lower portion of the abdomen. °· You pass bloody or black tarry stools. °MAKE SURE YOU: °· Understand these instructions.   °· Will watch your condition.   °· Will get help right away if you are not doing well or get worse.   °Document Released: 04/05/2005 Document Revised: 04/16/2013 Document Reviewed: 03/05/2013 °[employer]® Patient  Information ©2014 [employer]. ° °

## 2013-11-18 NOTE — ED Notes (Signed)
Pt reports left abdominal pain since yesterday afternoon. Pt had abdominal CT via Dr Hilma Favors. Pt was told she had "abnormal result" and was advised to come to ED for evaluation. Pt denies n/v/d.

## 2013-11-18 NOTE — ED Provider Notes (Signed)
CSN: [id number]     Arrival date & time 11/18/13  1724 History   First MD Initiated Contact with Patient 11/18/13 1854     Chief Complaint  Patient presents with  . Abdominal Pain     (Consider location/radiation/quality/duration/timing/severity/associated sxs/prior Treatment) HPI Comments: Patient presents to the ER for evaluation of abdominal pain. Patient reports that she has had left-sided abdominal pain approximately 5 PM yesterday. Pain has progressively worsened. Patient reports constant, moderate to severe pain. She has had nausea but no vomiting. No diarrhea. Patient saw her doctor earlier and was sent for CAT scan. She reports that she was told that the results were abnormal, was sent to the ER because she needed IV antibiotics.  Patient is a 58 y.o. female presenting with abdominal pain.  Abdominal Pain   Past Medical History  Diagnosis Date  . Fibromyalgia   . Hypertension   . PE (pulmonary embolism)   . Depression   . Chronic pain in left foot   . Chronic abdominal pain   . IBS (irritable bowel syndrome)   . Gastritis   . Internal hemorrhoids   . Kidney stone   . Thyroid disease   . Sleep apnea    Past Surgical History  Procedure Laterality Date  . Cesarean section    . Breast lumpectomy    . Dilation and curettage of uterus    . Abdominal surgery    . Cystoscopy w/ retrogrades  01/23/2012    Procedure: CYSTOSCOPY WITH RETROGRADE PYELOGRAM;  Surgeon: Marissa Nestle, MD;  Location: [facility];  Service: Urology;  Laterality: Left;  . Removal of stones  01/23/2012    Procedure: REMOVAL OF STONES;  Surgeon: Marissa Nestle, MD;  Location: [facility];  Service: Urology;  Laterality: N/A;   Family History  Problem Relation Age of Onset  . Heart attack Mother     CABG  . Heart attack Father     CABG  . Heart attack Brother   . Stroke Mother   . Hypertension Mother   . Hypertension Father    History  Substance Use Topics  . Smoking status: Former Research scientist (life sciences)  .  Smokeless tobacco: Not on file  . Alcohol Use: Yes     Comment: ocassionally   OB History   Grav Para Term Preterm Abortions TAB SAB Ect Mult Living                 Review of Systems  Gastrointestinal: Positive for abdominal pain.  All other systems reviewed and are negative.     Allergies  Sulfonamide derivatives  Home Medications   Prior to Admission medications   Medication Sig Start Date End Date Taking? Authorizing Provider  ALPRAZolam Duanne Moron) 1 MG tablet Take 1 mg by mouth 3 (three) times daily as needed. Anxiety    Historical Provider, MD  bisoprolol-hydrochlorothiazide (ZIAC) 5-6.25 MG per tablet Take 1 tablet by mouth daily.    Historical Provider, MD  Cholecalciferol (VITAMIN D) 2000 UNITS CAPS Take 1 capsule by mouth daily.    Historical Provider, MD  docusate sodium (COLACE) 100 MG capsule Take 100 mg by mouth at bedtime.    Historical Provider, MD  fish oil-omega-3 fatty acids 1000 MG capsule Take 1 g by mouth daily.    Historical Provider, MD  halobetasol (ULTRAVATE) 0.05 % cream Apply 1 application topically 2 (two) times daily.    Historical Provider, MD  ibuprofen (ADVIL,MOTRIN) 200 MG tablet Take 200 mg by mouth every 6 (  six) hours as needed for pain.    Historical Provider, MD  levothyroxine (SYNTHROID, LEVOTHROID) 50 MCG tablet Take 50 mcg by mouth daily.    Historical Provider, MD  MAGNESIUM PO Take 1 tablet by mouth daily.    Historical Provider, MD  meclizine (ANTIVERT) 25 MG tablet Take 25 mg by mouth 3 (three) times daily as needed. Dizziness    Historical Provider, MD  methylphenidate (CONCERTA) 36 MG CR tablet Take 36 mg by mouth daily.    Historical Provider, MD  Milnacipran HCl (SAVELLA) 100 MG TABS tablet Take 100 mg by mouth 2 (two) times daily.    Historical Provider, MD  pantoprazole (PROTONIX) 40 MG tablet Take 1 tablet (40 mg total) by mouth daily. 09/15/12   Nita Sells, MD  telmisartan (MICARDIS) 40 MG tablet Take 40 mg by mouth daily.     Historical Provider, MD  traMADol (ULTRAM) 50 MG tablet Take 50-100 mg by mouth 3 (three) times daily.    Historical Provider, MD  TURMERIC PO Take 1 tablet by mouth daily.    Historical Provider, MD   BP 201/85  Pulse 114  Temp(Src) 98.4 F (36.9 C) (Oral)  Resp 16  Ht 5' 4"  (1.626 m)  Wt 226 lb (102.513 kg)  BMI 38.77 kg/m2  SpO2 98% Physical Exam  Constitutional: She is oriented to person, place, and time. She appears well-developed and well-nourished. No distress.  HENT:  Head: Normocephalic and atraumatic.  Right Ear: Hearing normal.  Left Ear: Hearing normal.  Nose: Nose normal.  Mouth/Throat: Oropharynx is clear and moist and mucous membranes are normal.  Eyes: Conjunctivae and EOM are normal. Pupils are equal, round, and reactive to light.  Neck: Normal range of motion. Neck supple.  Cardiovascular: Regular rhythm, S1 normal and S2 normal.  Exam reveals no gallop and no friction rub.   No murmur heard. Pulmonary/Chest: Effort normal and breath sounds normal. No respiratory distress. She exhibits no tenderness.  Abdominal: Soft. Normal appearance and bowel sounds are normal. There is no hepatosplenomegaly. There is tenderness in the epigastric area, left upper quadrant and left lower quadrant. There is no rebound, no guarding, no tenderness at McBurney's point and negative Murphy's sign. No hernia.  Musculoskeletal: Normal range of motion.  Neurological: She is alert and oriented to person, place, and time. She has normal strength. No cranial nerve deficit or sensory deficit. Coordination normal. GCS eye subscore is 4. GCS verbal subscore is 5. GCS motor subscore is 6.  Skin: Skin is warm, dry and intact. No rash noted. No cyanosis.  Psychiatric: She has a normal mood and affect. Her speech is normal and behavior is normal. Thought content normal.    ED Course  Procedures (including critical care time) Labs Review Labs Reviewed  CBC WITH DIFFERENTIAL - Abnormal; Notable  for the following:    WBC 10.6 (*)    All other components within normal limits  COMPREHENSIVE METABOLIC PANEL - Abnormal; Notable for the following:    Albumin 3.4 (*)    All other components within normal limits  LIPASE, BLOOD    Imaging Review Ct Abdomen Pelvis W Contrast  11/18/2013   CLINICAL DATA:  Left upper quadrant pain  EXAM: CT ABDOMEN AND PELVIS WITH CONTRAST  TECHNIQUE: Multidetector CT imaging of the abdomen and pelvis was performed using the standard protocol following bolus administration of intravenous contrast.  CONTRAST:  135m OMNIPAQUE IOHEXOL 300 MG/ML  SOLN  COMPARISON:  01/09/2012  FINDINGS: Lung bases are unremarkable.  There are streaky artifacts from patient's large body habitus.  Sagittal images of the spine are unremarkable.  There is fatty infiltration of the liver. No calcified gallstones are noted within gallbladder. The pancreas, spleen and adrenal glands are unremarkable. A portal caval lymph node axial image 29 measures 1.5 by 0.8 cm.  Kidneys are symmetrical in size and enhancement. There is nonobstructive calculus in upper pole of the right kidney measures 4 mm. No hydronephrosis or hydroureter. No calcified ureteral calculi are noted bilaterally. No aortic aneurysm. No small bowel obstruction. Moderate stool noted in right colon. No pericecal inflammation. Normal appendix. The terminal ileum is unremarkable.  In axial image 48 there is ring-like stranding of the mesenteric fat in left mesentery just lateral to left colon. This is best seen in sagittal image 112. Findings are highly suspicious for mesenteric appendagitis rather than focal diverticulitis. There is no adjacent thickening of colonic wall. No mesenteric abscess is noted.  The uterus is unremarkable. There is probable prior tubal ligation on the left.  The urinary bladder is unremarkable. No inguinal adenopathy. No destructive bony lesions are noted within pelvis.  Degenerative changes bilateral SI joints.   Delayed renal images shows bilateral renal symmetrical excretion.  IMPRESSION: 1. There is ring like stranding of the mesenteric fat just lateral to left colon best seen in axial image 46 and sagittal image 112 measures about 3 cm. Findings highly suspicious for mesenteric appendagitis or focal panniculitis rather than focal diverticulitis. No thickening of adjacent colonic wall. No mesenteric abscess. 2. No hydronephrosis or hydroureter. Bilateral renal symmetrical excretion. 3. There is right nonobstructive nephrolithiasis. 4. Fatty infiltration of the liver. 5. Abundant stool noted in right colon. No pericecal inflammation. Normal appendix. 6. Borderline portal caval lymph node measures 1.4 x 0.8 cm probable reactive.   Electronically Signed   By: Lahoma Crocker M.D.   On: 11/18/2013 16:59     EKG Interpretation None      MDM   Final diagnoses:  Abdominal pain  Epiploic appendagitis    Patient presents to the ER for evaluation of abdominal pain. She had an outpatient CAT scan performed earlier. I did review the records. Findings are consistent with epiploic appendicitis. Focal panniculitis is also possible. Focal diverticulitis cannot be ruled out, but the radiologist felt that the CAT scan was very suspicious for epiploic appendage. This does not require specific treatment.  Examination revealed diffuse left-sided tenderness. No peritonitis. The patient does appear to be comfortable. She was hypertensive and tachycardic, likely secondary to the pain. The patient was administered IV fluids and pain medication with improvement. She was empirically initiated on Cipro here in the ER will be continued on outpatient therapy as well as analgesia.    Orpah Greek, MD 11/18/13 2100

## 2013-11-20 ENCOUNTER — Ambulatory Visit (HOSPITAL_COMMUNITY): Payer: Self-pay | Admitting: Psychology

## 2013-12-11 ENCOUNTER — Other Ambulatory Visit (HOSPITAL_COMMUNITY): Payer: Self-pay | Admitting: Family Medicine

## 2013-12-11 DIAGNOSIS — N6320 Unspecified lump in the left breast, unspecified quadrant: Secondary | ICD-10-CM

## 2013-12-24 ENCOUNTER — Other Ambulatory Visit (HOSPITAL_COMMUNITY): Payer: Self-pay | Admitting: Family Medicine

## 2013-12-24 ENCOUNTER — Ambulatory Visit (HOSPITAL_COMMUNITY)
Admission: RE | Admit: 2013-12-24 | Discharge: 2013-12-24 | Disposition: A | Discharge status: Home / Self Care | Payer: PRIVATE HEALTH INSURANCE | Source: Ambulatory Visit | Attending: Family Medicine | Admitting: Family Medicine

## 2013-12-24 ENCOUNTER — Encounter (HOSPITAL_COMMUNITY): Payer: Self-pay

## 2013-12-24 DIAGNOSIS — IMO0002 Reserved for concepts with insufficient information to code with codable children: Secondary | ICD-10-CM

## 2013-12-24 DIAGNOSIS — N6320 Unspecified lump in the left breast, unspecified quadrant: Secondary | ICD-10-CM

## 2013-12-24 DIAGNOSIS — R229 Localized swelling, mass and lump, unspecified: Principal | ICD-10-CM

## 2013-12-24 DIAGNOSIS — N641 Fat necrosis of breast: Secondary | ICD-10-CM | POA: Insufficient documentation

## 2013-12-24 MED ORDER — LIDOCAINE HCL (PF) 2 % IJ SOLN
INTRAMUSCULAR | Status: AC
  Filled 2013-12-24: qty 10

## 2013-12-24 MED ORDER — LIDOCAINE HCL (PF) 2 % IJ SOLN
10.0000 mL | Freq: Once | INTRAMUSCULAR | Status: AC
  Administered 2013-12-24: 10 mL

## 2013-12-24 NOTE — Discharge Instructions (Signed)
Breast Biopsy Care After These instructions give you information on caring for yourself after your procedure. Your doctor may also give you more specific instructions. Call your doctor if you have any problems or questions after your procedure. HOME CARE  Only take medicine as told by your doctor.  Do not take aspirin.  Keep your sutures (stitches) dry when bathing.  Protect the biopsy area. Do not let the area get bumped.  Avoid activities that could pull the biopsy site open until your doctor approves. This includes:  Stretching.  Reaching.  Exercise.  Sports.  Lifting more than 3lb.  Continue your normal diet.  Wear a good support bra for as long as told by your doctor.  Change any bandages (dressings) as told by your doctor.  Do not drink alcohol while taking pain medicine.  Keep all doctor visits as told. Ask when your test results will be ready. Make sure you get your test results. GET HELP RIGHT AWAY IF:   You have a fever.  You have more bleeding (more than a small spot) from the biopsy site.  You have trouble breathing.  You have yellowish-white fluid (pus) coming from the biopsy site.  You have redness, puffiness (swelling), or more pain in the biopsy site.  You have a bad smell coming from the biopsy site.  Your biopsy site opens after sutures, staples, or sticky strips have been removed.  You have a rash.  You need stronger medicine. MAKE SURE YOU:  Understand these instructions.  Will watch your condition.  Will get help right away if you are not doing well or get worse. Document Released: 04/22/2009 Document Revised: 09/18/2011 Document Reviewed: 08/06/2011 [facility] Patient Information 2015 [place], [place]. This information is not intended to replace advice given to you by your health care provider. Make sure you discuss any questions you have with your health care provider.  Breast Biopsy A breast biopsy is a test during which a sample of  tissue is taken from your breast. The breast tissue is looked at under a microscope for cancer cells.  BEFORE THE PROCEDURE  Make plans to have someone drive you home after the test.  Do not smoke for 2 weeks before the test. Stop smoking, if you smoke.  Do not drink alcohol for 24 hours before the test.  Wear a good support bra to the test. PROCEDURE  You may be given one of the following:  A medicine to numb the breast area (local anesthesia).  A medicine to make you sleep (general anesthesia). There are different types of breast biopsies. They include:  Fine-needle aspiration.  A needle is put into the breast lump.  The needle takes out fluid and cells from the lump.  Ultrasound imaging may be used to help find the lump and to put the needle it the right spot.  Core-needle biopsy.  A needle is put into the breast lump.  The needle is put in your breast 3 6 times.  The needle removes breast tissue.  An ultrasound image or X-ray is often used to find the right spot to put in the needle.  Stereotactic biopsy.  X-rays and a computer are used to study X-ray pictures of the breast lump.  The computer finds where the needle needs to be put into the breast.  Tissue samples are taken out.  Vacuum-assisted biopsy.  A small cut (incision) is made in your breast.  A biopsy device is put through the cut and into the breast  tissue.  The biopsy device draws abnormal breast tissue into the biopsy device.  A large tissue sample is often removed.  No stitches are needed.  Ultrasound-guided core-needle biopsy.  Ultrasound imaging helps guide the needle into the area of the breast that is not normal.  A cut is made in the breast. The needle is put in the needle.  Tissue samples are taken out.  Open biopsy.  A large cut is made in the breast.  Your doctor will try to remove the whole breast lump or as much as possible. All tissue, fluid, or cell samples are looked  at under a microscope.  AFTER THE PROCEDURE  You will be taken to an area to recover. You will be able to go home once you are doing well and are without problems.  You may have bruising on your breast. This is normal.  A pressure bandage (dressing) may be put on your breast for 24 8 hours. This type of bandage is wrapped tightly around your chest. It helps stop fluid from building up underneath tissues. Document Released: 09/18/2011 Document Reviewed: 09/18/2011 [facility] Patient Information 2014 [place].

## 2013-12-30 ENCOUNTER — Ambulatory Visit: Payer: Self-pay | Admitting: Gastroenterology

## 2014-01-29 ENCOUNTER — Encounter (HOSPITAL_COMMUNITY): Payer: Self-pay | Admitting: Emergency Medicine

## 2014-01-29 ENCOUNTER — Ambulatory Visit: Payer: Self-pay | Admitting: Gastroenterology

## 2014-01-29 ENCOUNTER — Emergency Department (HOSPITAL_COMMUNITY)
Admission: EM | Admit: 2014-01-29 | Discharge: 2014-01-29 | Disposition: A | Discharge status: Home / Self Care | Payer: PRIVATE HEALTH INSURANCE | Attending: Emergency Medicine | Admitting: Emergency Medicine

## 2014-01-29 ENCOUNTER — Emergency Department (HOSPITAL_COMMUNITY): Payer: PRIVATE HEALTH INSURANCE

## 2014-01-29 DIAGNOSIS — Z79899 Other long term (current) drug therapy: Secondary | ICD-10-CM | POA: Insufficient documentation

## 2014-01-29 DIAGNOSIS — Z87891 Personal history of nicotine dependence: Secondary | ICD-10-CM | POA: Insufficient documentation

## 2014-01-29 DIAGNOSIS — Z8739 Personal history of other diseases of the musculoskeletal system and connective tissue: Secondary | ICD-10-CM | POA: Insufficient documentation

## 2014-01-29 DIAGNOSIS — I10 Essential (primary) hypertension: Secondary | ICD-10-CM | POA: Insufficient documentation

## 2014-01-29 DIAGNOSIS — R079 Chest pain, unspecified: Secondary | ICD-10-CM

## 2014-01-29 DIAGNOSIS — E079 Disorder of thyroid, unspecified: Secondary | ICD-10-CM | POA: Insufficient documentation

## 2014-01-29 DIAGNOSIS — F3289 Other specified depressive episodes: Secondary | ICD-10-CM | POA: Insufficient documentation

## 2014-01-29 DIAGNOSIS — R000 Tachycardia, unspecified: Secondary | ICD-10-CM | POA: Insufficient documentation

## 2014-01-29 DIAGNOSIS — Z8719 Personal history of other diseases of the digestive system: Secondary | ICD-10-CM | POA: Insufficient documentation

## 2014-01-29 DIAGNOSIS — Z9889 Other specified postprocedural states: Secondary | ICD-10-CM | POA: Insufficient documentation

## 2014-01-29 DIAGNOSIS — Z87442 Personal history of urinary calculi: Secondary | ICD-10-CM | POA: Insufficient documentation

## 2014-01-29 DIAGNOSIS — Z86711 Personal history of pulmonary embolism: Secondary | ICD-10-CM | POA: Insufficient documentation

## 2014-01-29 DIAGNOSIS — G8929 Other chronic pain: Secondary | ICD-10-CM | POA: Insufficient documentation

## 2014-01-29 DIAGNOSIS — F329 Major depressive disorder, single episode, unspecified: Secondary | ICD-10-CM | POA: Insufficient documentation

## 2014-01-29 DIAGNOSIS — R110 Nausea: Secondary | ICD-10-CM | POA: Insufficient documentation

## 2014-01-29 LAB — URINALYSIS, ROUTINE W REFLEX MICROSCOPIC
BILIRUBIN URINE: NEGATIVE
GLUCOSE, UA: NEGATIVE mg/dL
Hgb urine dipstick: NEGATIVE
KETONES UR: NEGATIVE mg/dL
Leukocytes, UA: NEGATIVE
Nitrite: NEGATIVE
PH: 7 (ref 5.0–8.0)
Protein, ur: NEGATIVE mg/dL
Specific Gravity, Urine: 1.02 (ref 1.005–1.030)
Urobilinogen, UA: 0.2 mg/dL (ref 0.0–1.0)

## 2014-01-29 LAB — BASIC METABOLIC PANEL
Anion gap: 12 (ref 5–15)
BUN: 10 mg/dL (ref 6–23)
CHLORIDE: 101 meq/L (ref 96–112)
CO2: 26 meq/L (ref 19–32)
Calcium: 9.3 mg/dL (ref 8.4–10.5)
Creatinine, Ser: 0.61 mg/dL (ref 0.50–1.10)
GFR calc Af Amer: >90 mL/min (ref >90)
GFR calc non Af Amer: >90 mL/min (ref >90)
GLUCOSE: 145 mg/dL — AB (ref 70–99)
POTASSIUM: 4 meq/L (ref 3.7–5.3)
SODIUM: 139 meq/L (ref 137–147)

## 2014-01-29 LAB — CBC
HEMATOCRIT: 45.3 % (ref 36.0–46.0)
HEMOGLOBIN: 15.5 g/dL — AB (ref 12.0–15.0)
MCH: 30.8 pg (ref 26.0–34.0)
MCHC: 34.2 g/dL (ref 30.0–36.0)
MCV: 89.9 fL (ref 78.0–100.0)
Platelets: 276 10*3/uL (ref 150–400)
RBC: 5.04 MIL/uL (ref 3.87–5.11)
RDW: 13.1 % (ref 11.5–15.5)
WBC: 9.2 10*3/uL (ref 4.0–10.5)

## 2014-01-29 LAB — D-DIMER, QUANTITATIVE: D-Dimer, Quant: <0.27 ug/mL-FEU (ref 0.00–0.48)

## 2014-01-29 LAB — TROPONIN I

## 2014-01-29 LAB — LIPASE, BLOOD: LIPASE: 48 U/L (ref 11–59)

## 2014-01-29 MED ORDER — ONDANSETRON HCL 4 MG/2ML IJ SOLN
4.0000 mg | Freq: Once | INTRAMUSCULAR | Status: AC
  Administered 2014-01-29: 4 mg via INTRAVENOUS

## 2014-01-29 MED ORDER — ONDANSETRON HCL 4 MG/2ML IJ SOLN
4.0000 mg | Freq: Once | INTRAMUSCULAR | Status: AC
  Filled 2014-01-29: qty 2

## 2014-01-29 MED ORDER — ONDANSETRON 4 MG PO TBDP: 4.0000 mg | ORAL_TABLET | Freq: Three times a day (TID) | ORAL | Status: DC | PRN

## 2014-01-29 MED ORDER — SODIUM CHLORIDE 0.9 % IV SOLN
1000.0000 mL | Freq: Once | INTRAVENOUS | Status: AC
  Administered 2014-01-29: 1000 mL via INTRAVENOUS

## 2014-01-29 NOTE — ED Provider Notes (Signed)
CSN: [id number]     Arrival date & time 01/29/14  1117 History  This chart was scribed for Yolanda Muskrat, MD by Starleen Arms, ED Scribe. This patient was seen in room [facility] and the patient's care was started at 11:26 AM.   Chief Complaint  Patient presents with  . Chest Pain    The history is provided by the patient. No language interpreter was used.    HPI Comments: Yolanda [name] is a 58 y.o. female HTN and PE who presents to the Emergency Department complaining of sudden onset central chest pain that began this morning and radiates to her right shoulder.  She describes the pain as sharp at onset but states it is currently dull.  She also describes a state of general discomfort.  She has had associated nausea for the last 4 days and had chills 2 days ago.  She states that she drove to [facility] last week and noticed some increased foot swelling in the period of time surrounding the drive.  She also reports a minor cough.  She states she had an episode of similar pain last year for which she was hospitalized for four days but required no surgical intervention.  She denies fever, vomiting, rash. Patient is a non-smoker and does not consume alcohol.     Past Medical History  Diagnosis Date  . Fibromyalgia   . Hypertension   . PE (pulmonary embolism)   . Depression   . Chronic pain in left foot   . Chronic abdominal pain   . IBS (irritable bowel syndrome)   . Gastritis   . Internal hemorrhoids   . Kidney stone   . Thyroid disease   . Sleep apnea    Past Surgical History  Procedure Laterality Date  . Cesarean section    . Breast lumpectomy    . Dilation and curettage of uterus    . Abdominal surgery    . Cystoscopy w/ retrogrades  01/23/2012    Procedure: CYSTOSCOPY WITH RETROGRADE PYELOGRAM;  Surgeon: Marissa Nestle, MD;  Location: [facility];  Service: Urology;  Laterality: Left;  . Removal of stones  01/23/2012    Procedure: REMOVAL OF STONES;  Surgeon: Marissa Nestle,  MD;  Location: [facility];  Service: Urology;  Laterality: N/A;  . Colonoscopy  2006    internal hemorrhoids   Family History  Problem Relation Age of Onset  . Heart attack Mother     CABG  . Heart attack Father     CABG  . Heart attack Brother   . Stroke Mother   . Hypertension Mother   . Hypertension Father    History  Substance Use Topics  . Smoking status: Former Research scientist (life sciences)  . Smokeless tobacco: Not on file  . Alcohol Use: Yes     Comment: ocassionally   OB History   Grav Para Term Preterm Abortions TAB SAB Ect Mult Living                 Review of Systems  Constitutional:       Per HPI, otherwise negative  HENT:       Per HPI, otherwise negative  Respiratory:       Per HPI, otherwise negative  Cardiovascular:       Per HPI, otherwise negative  Gastrointestinal: Positive for nausea and abdominal pain. Negative for vomiting.  Endocrine:       Negative aside from HPI  Genitourinary:       Neg  aside from HPI   Musculoskeletal:       Per HPI, otherwise negative  Skin: Negative.   Neurological: Negative for syncope.      Allergies  Sulfonamide derivatives  Home Medications   Prior to Admission medications   Medication Sig Start Date End Date Taking? Authorizing Provider  ALPRAZolam Duanne Moron) 1 MG tablet Take 1 mg by mouth 3 (three) times daily as needed. Anxiety    Historical Provider, MD  CALCIUM PO Take 1 tablet by mouth daily.    Historical Provider, MD  Cholecalciferol (VITAMIN D) 2000 UNITS CAPS Take 1 capsule by mouth daily.    Historical Provider, MD  ciprofloxacin (CIPRO) 500 MG tablet Take 1 tablet (500 mg total) by mouth 2 (two) times daily. 11/18/13   Orpah Greek, MD  cycloSPORINE (RESTASIS) 0.05 % ophthalmic emulsion Place 1 drop into both eyes 2 (two) times daily.    Historical Provider, MD  docusate sodium (COLACE) 100 MG capsule Take 100 mg by mouth at bedtime.    Historical Provider, MD  fish oil-omega-3 fatty acids 1000 MG capsule Take 1 g  by mouth daily.    Historical Provider, MD  halobetasol (ULTRAVATE) 0.05 % cream Apply 1 application topically 2 (two) times daily.    Historical Provider, MD  ibuprofen (ADVIL,MOTRIN) 200 MG tablet Take 200 mg by mouth every 6 (six) hours as needed for pain.    Historical Provider, MD  levothyroxine (SYNTHROID, LEVOTHROID) 50 MCG tablet Take 50 mcg by mouth daily.    Historical Provider, MD  MAGNESIUM PO Take 1 tablet by mouth daily.    Historical Provider, MD  meclizine (ANTIVERT) 25 MG tablet Take 25 mg by mouth 3 (three) times daily as needed. Dizziness    Historical Provider, MD  methylphenidate (CONCERTA) 36 MG CR tablet Take 36 mg by mouth daily.    Historical Provider, MD  metroNIDAZOLE (FLAGYL) 500 MG tablet Take 1 tablet (500 mg total) by mouth 3 (three) times daily. 11/18/13   Orpah Greek, MD  Milnacipran HCl (SAVELLA) 100 MG TABS tablet Take 100 mg by mouth 2 (two) times daily.    Historical Provider, MD  oxyCODONE-acetaminophen (PERCOCET) 5-325 MG per tablet Take 2 tablets by mouth every 4 (four) hours as needed. 11/18/13   Orpah Greek, MD  pantoprazole (PROTONIX) 40 MG tablet Take 1 tablet (40 mg total) by mouth daily. 09/15/12   Nita Sells, MD  telmisartan (MICARDIS) 40 MG tablet Take 40 mg by mouth daily.    Historical Provider, MD  traMADol (ULTRAM) 50 MG tablet Take 50-100 mg by mouth 3 (three) times daily.    Historical Provider, MD   BP 165/96  Pulse 117  Temp(Src) 97.7 F (36.5 C) (Oral)  Resp 24  Ht 5' 4"  (1.626 m)  Wt 230 lb (104.327 kg)  BMI 39.46 kg/m2  SpO2 99% Physical Exam  Nursing note and vitals reviewed. Constitutional: She is oriented to person, place, and time. She appears well-developed and well-nourished. No distress.  HENT:  Head: Normocephalic and atraumatic.  Eyes: Conjunctivae and EOM are normal.  Cardiovascular: Regular rhythm and normal heart sounds.   Tachycardia.  Pulmonary/Chest: Effort normal and breath sounds  normal. No stridor. No respiratory distress. She has no wheezes. She has no rales.  Abdominal: She exhibits no distension.  Musculoskeletal: She exhibits no edema.  Neurological: She is alert and oriented to person, place, and time. No cranial nerve deficit.  Skin: Skin is warm and dry.  Psychiatric:  She has a normal mood and affect.    ED Course  Procedures (including critical care time)  DIAGNOSTIC STUDIES: Oxygen Saturation is 99% on RA, normal by my interpretation.    COORDINATION OF CARE:  11:41 AM Discussed plan to order anti-emetics, imaging, and labs.  Patient acknowledges and agrees with plan.     Labs Review Labs Reviewed  CBC - Abnormal; Notable for the following:    Hemoglobin 15.5 (*)    All other components within normal limits  BASIC METABOLIC PANEL - Abnormal; Notable for the following:    Glucose, Bld 145 (*)    All other components within normal limits  TROPONIN I  LIPASE, BLOOD  URINALYSIS, ROUTINE W REFLEX MICROSCOPIC  D-DIMER, QUANTITATIVE    Imaging Review Dg Chest Port 1 View  01/29/2014   CLINICAL DATA:  Chest pain.  EXAM: PORTABLE CHEST - 1 VIEW  COMPARISON:  09/11/2012.  FINDINGS: Cardiopericardial silhouette within normal limits. Mediastinal contours normal. Trachea midline. No airspace disease or effusion. Monitoring leads project over the chest.  IMPRESSION: No active disease.   Electronically Signed   By: Dereck Ligas M.D.   On: 01/29/2014 12:20   US Abdomen Limited Ruq  01/29/2014   CLINICAL DATA:  Right upper quadrant pain  EXAM: US ABDOMEN LIMITED - RIGHT UPPER QUADRANT  COMPARISON:  09/11/2012  FINDINGS: Gallbladder:  No gallstones or wall thickening visualized. No sonographic Murphy sign noted.  Common bile duct:  Diameter: 6 mm  Liver:  The liver is diffusely heterogeneous and hyperechoic. No focal mass.  IMPRESSION: Normal gallbladder and biliary tree.  The liver is heterogeneous and hyperechoic. Differential diagnosis includes diffuse  hepatic steatosis and diffuse hepatic parenchymal disease. Correlation with liver function tests is recommended. The CT scan from May of this year demonstrates diffuse hepatic steatosis.   Electronically Signed   By: Maryclare Bean M.D.   On: 01/29/2014 13:11     EKG Interpretation   Date/Time:  Thursday January 29 2014 11:24:10 EDT Ventricular Rate:  116 PR Interval:  137 QRS Duration: 75 QT Interval:  349 QTC Calculation: 485 R Axis:   28 Text Interpretation:  Sinus tachycardia Low voltage, precordial leads  Baseline wander in lead(s) I III aVL Sinus tachycardia Artifact Abnormal  ekg Confirmed by Yolanda Muskrat  MD [phone]) on 01/29/2014 2:07:13 PM     Update: On exam the patient is awake alert, in no distress.  I discussed all findings with her and her daughter. Patient will follow up with gastroenterology and primary care. MDM   Final diagnoses:  Chest pain, unspecified chest pain type   patient presents with chest pain.  Notably, the patient's multiple medical problems, including prior PE, fibromyalgia. With patient's description of nausea, retching and a positive Murphy sign, ultrasound was performed in addition to labs. Patient's evaluation here is largely reassuring, she remained in no distress, hemodynamically stable, neurologically intact throughout.  Patient was discharged in stable condition to continue her evaluation as an outpatient   I personally performed the services described in this documentation, which was scribed in my presence. The recorded information has been reviewed and is accurate.      Yolanda Muskrat, MD 01/29/14 [phone]

## 2014-01-29 NOTE — Discharge Instructions (Signed)
As discussed, your evaluation today has been largely reassuring.  But, it is important that you monitor your condition carefully, and do not hesitate to return to the ED if you develop new, or concerning changes in your condition. ? ?Otherwise, please follow-up with your physician for appropriate ongoing care. ? ?

## 2014-01-29 NOTE — ED Notes (Signed)
Pt reports chest pain that started this am at 830. Pt reports hasn't felt well for last several days. Pt reports nausea at this time.

## 2014-02-23 ENCOUNTER — Emergency Department (HOSPITAL_COMMUNITY): Payer: PRIVATE HEALTH INSURANCE

## 2014-02-23 ENCOUNTER — Encounter (HOSPITAL_COMMUNITY): Payer: Self-pay | Admitting: Emergency Medicine

## 2014-02-23 ENCOUNTER — Emergency Department (HOSPITAL_COMMUNITY)
Admission: EM | Admit: 2014-02-23 | Discharge: 2014-02-23 | Disposition: A | Discharge status: Home / Self Care | Payer: PRIVATE HEALTH INSURANCE | Attending: Emergency Medicine | Admitting: Emergency Medicine

## 2014-02-23 DIAGNOSIS — F3289 Other specified depressive episodes: Secondary | ICD-10-CM | POA: Diagnosis not present

## 2014-02-23 DIAGNOSIS — R296 Repeated falls: Secondary | ICD-10-CM | POA: Insufficient documentation

## 2014-02-23 DIAGNOSIS — S199XXA Unspecified injury of neck, initial encounter: Secondary | ICD-10-CM | POA: Diagnosis not present

## 2014-02-23 DIAGNOSIS — Z791 Long term (current) use of non-steroidal anti-inflammatories (NSAID): Secondary | ICD-10-CM | POA: Insufficient documentation

## 2014-02-23 DIAGNOSIS — Z87891 Personal history of nicotine dependence: Secondary | ICD-10-CM | POA: Insufficient documentation

## 2014-02-23 DIAGNOSIS — F329 Major depressive disorder, single episode, unspecified: Secondary | ICD-10-CM | POA: Diagnosis not present

## 2014-02-23 DIAGNOSIS — G8929 Other chronic pain: Secondary | ICD-10-CM | POA: Insufficient documentation

## 2014-02-23 DIAGNOSIS — I10 Essential (primary) hypertension: Secondary | ICD-10-CM | POA: Diagnosis not present

## 2014-02-23 DIAGNOSIS — S0993XA Unspecified injury of face, initial encounter: Secondary | ICD-10-CM | POA: Diagnosis not present

## 2014-02-23 DIAGNOSIS — Z79899 Other long term (current) drug therapy: Secondary | ICD-10-CM | POA: Insufficient documentation

## 2014-02-23 DIAGNOSIS — K2970 Gastritis, unspecified, without bleeding: Secondary | ICD-10-CM | POA: Insufficient documentation

## 2014-02-23 DIAGNOSIS — E079 Disorder of thyroid, unspecified: Secondary | ICD-10-CM | POA: Diagnosis not present

## 2014-02-23 DIAGNOSIS — Z87442 Personal history of urinary calculi: Secondary | ICD-10-CM | POA: Diagnosis not present

## 2014-02-23 DIAGNOSIS — S42033A Displaced fracture of lateral end of unspecified clavicle, initial encounter for closed fracture: Secondary | ICD-10-CM | POA: Insufficient documentation

## 2014-02-23 DIAGNOSIS — Z86711 Personal history of pulmonary embolism: Secondary | ICD-10-CM | POA: Diagnosis not present

## 2014-02-23 DIAGNOSIS — K2990 Gastroduodenitis, unspecified, without bleeding: Secondary | ICD-10-CM

## 2014-02-23 DIAGNOSIS — S42001A Fracture of unspecified part of right clavicle, initial encounter for closed fracture: Secondary | ICD-10-CM

## 2014-02-23 DIAGNOSIS — S46909A Unspecified injury of unspecified muscle, fascia and tendon at shoulder and upper arm level, unspecified arm, initial encounter: Secondary | ICD-10-CM | POA: Insufficient documentation

## 2014-02-23 DIAGNOSIS — S4980XA Other specified injuries of shoulder and upper arm, unspecified arm, initial encounter: Secondary | ICD-10-CM | POA: Diagnosis present

## 2014-02-23 DIAGNOSIS — IMO0002 Reserved for concepts with insufficient information to code with codable children: Secondary | ICD-10-CM | POA: Insufficient documentation

## 2014-02-23 DIAGNOSIS — Y9289 Other specified places as the place of occurrence of the external cause: Secondary | ICD-10-CM | POA: Insufficient documentation

## 2014-02-23 DIAGNOSIS — S43101A Unspecified dislocation of right acromioclavicular joint, initial encounter: Secondary | ICD-10-CM

## 2014-02-23 DIAGNOSIS — Y9389 Activity, other specified: Secondary | ICD-10-CM | POA: Insufficient documentation

## 2014-02-23 MED ORDER — OXYCODONE-ACETAMINOPHEN 5-325 MG PO TABS: 1.0000 | ORAL_TABLET | ORAL | Status: DC | PRN

## 2014-02-23 MED ORDER — OXYCODONE-ACETAMINOPHEN 5-325 MG PO TABS
1.0000 | ORAL_TABLET | Freq: Once | ORAL | Status: AC
Start: 2014-02-23 — End: 2014-02-23
  Administered 2014-02-23: 1 via ORAL

## 2014-02-23 MED ORDER — MORPHINE SULFATE 2 MG/ML IJ SOLN
6.0000 mg | Freq: Once | INTRAMUSCULAR | Status: AC
  Administered 2014-02-23: 6 mg via INTRAMUSCULAR
  Filled 2014-02-23: qty 3

## 2014-02-23 MED ORDER — ONDANSETRON 8 MG PO TBDP
8.0000 mg | ORAL_TABLET | Freq: Once | ORAL | Status: AC
  Administered 2014-02-23: 8 mg via ORAL
  Filled 2014-02-23: qty 1

## 2014-02-23 MED ORDER — OXYCODONE-ACETAMINOPHEN 5-325 MG PO TABS
ORAL_TABLET | ORAL | Status: AC
  Filled 2014-02-23: qty 1

## 2014-02-23 NOTE — ED Provider Notes (Signed)
Medical screening examination/treatment/procedure(s) were performed by non-physician practitioner and as supervising physician I was immediately available for consultation/collaboration.   EKG Interpretation None        Merryl Hacker, MD 02/23/14 [phone]

## 2014-02-23 NOTE — ED Notes (Signed)
Pt states she was in the shower using her shower chair when the leg broke and she fell out of the shower injuring her neck and right shoulder

## 2014-02-23 NOTE — ED Notes (Signed)
Pt reports getting in shower and when sitting in shower chair, shower chair broke and pt fell into shower 12 inches off the floor. Pt reports hitting the door, causing injury in right shoulder and neck and slid into bottom of shower.  pt reports she cannot move R extremity.  pt reports nausea when moving arm.

## 2014-02-23 NOTE — Discharge Instructions (Signed)
Acromioclavicular Injuries The acromioclavicular [facility]) joint is the joint in the shoulder. There are many bands of tissue (ligaments) that surround the [facility] bones and joints. These bands of tissue can tear, which can lead to sprains and separations. The bones of the [facility] joint can also break (fracture).  HOME CARE   Put ice on the injured area.  Put ice in a plastic bag.  Place a towel between your skin and the bag.  Leave the ice on for 15-20 minutes, 03-04 times a day.  Wear your sling as told by your doctor. Remove the sling before showering and bathing. Keep the shoulder in the same place as when the sling is on. Do not lift the arm.  Gently tighten your figure-eight splint (if applied) every day. Tighten it enough to keep the shoulders held back. There should be room to place your finger between your body and the strap. Loosen the splint right away if you lose feeling (numbness) or have tingling in your hands.  Only take medicine as told by your doctor.  Keep all follow-up visits with your doctor. GET HELP RIGHT AWAY IF:   Your medicine does not help your pain.  You have more puffiness (swelling) or your bruising gets worse rather than better.  You were unable to follow up as told by your doctor.  You have tingling or lose even more feeling in your arm, forearm, or hand.  Your arm is cold or pale.  You have more pain in the hand, forearm, or fingers. MAKE SURE YOU:   Understand these instructions.  Will watch your condition.  Will get help right away if you are not doing well or get worse. Document Released: 12/14/2009 Document Revised: 09/18/2011 Document Reviewed: 12/14/2009 [facility] Patient Information 2015 [place], [place]. This information is not intended to replace advice given to you by your health care provider. Make sure you discuss any questions you have with your health care provider.  Clavicle Fracture The clavicle, also called the collarbone, is the long bone  that connects your shoulder to your rib cage. You can feel your collarbone at the top of your shoulders and rib cage. A clavicle fracture is a broken clavicle. It is a common injury that can happen at any age.  CAUSES Common causes of a clavicle fracture include:  A direct blow to your shoulder.  A car accident.  A fall, especially if you try to break your fall with an outstretched arm. RISK FACTORS You may be at increased risk if:  You are younger than 25 years or older than 71 years. Most clavicle fractures happen to people who are younger than 25 years.  You are a female.  You play contact sports. SIGNS AND SYMPTOMS A fractured clavicle is painful. It also makes it hard to move your arm. Other signs and symptoms may include:  A shoulder that drops downward and forward.  Pain when trying to lift your shoulder.  Bruising, swelling, and tenderness over your clavicle.  A grinding noise when you try to move your shoulder.  A bump over your clavicle. DIAGNOSIS Your health care provider can usually diagnose a clavicle fracture by asking about your injury and examining your shoulder and clavicle. He or she may take an X-ray to determine the position of your clavicle. TREATMENT Treatment depends on the position of your clavicle after the fracture:  If the broken ends of the bone are not out of place, your health care provider may put your arm in  a sling or wrap a support bandage around your chest (figure-of-eight wrap).  If the broken ends of the bone are out of place, you may need surgery. Surgery may involve placing screws, pins, or plates to keep your clavicle stable while it heals. Healing may take about 3 months. When your health care provider thinks your fracture has healed enough, you may have to do physical therapy to regain normal movement and build up your arm strength. HOME CARE INSTRUCTIONS   Apply ice to the injured area:  Put ice in a plastic bag.  Place a towel  between your skin and the bag.  Leave the ice on for 20 minutes, 2-3 times a day.  If you have a wrap or splint:  Wear it all the time, and remove it only to take a bath or shower.  When you bathe or shower, keep your shoulder in the same position as when the sling or wrap is on.  Do not lift your arm.  If you have a figure-of-eight wrap:  Another person must tighten it every day.  It should be tight enough to hold your shoulders back.  Allow enough room to place your index finger between your body and the strap.  Loosen the wrap immediately if you feel numbness or tingling in your hands.  Only take medicines as directed by your health care provider.  Avoid activities that make the injury or pain worse for 4-6 weeks after surgery.  Keep all follow-up appointments. SEEK MEDICAL CARE IF:  Your medicine is not helping to relieve pain and swelling. SEEK IMMEDIATE MEDICAL CARE IF:  Your arm is numb, cold, or pale, even when the splint is loose. MAKE SURE YOU:   Understand these instructions.  Will watch your condition.  Will get help right away if you are not doing well or get worse. Document Released: 04/05/2005 Document Revised: 07/01/2013 Document Reviewed: 05/19/2013 [facility] Patient Information 2015 [place], [place]. This information is not intended to replace advice given to you by your health care provider. Make sure you discuss any questions you have with your health care provider.

## 2014-02-23 NOTE — ED Provider Notes (Signed)
CSN: [id number]     Arrival date & time 02/23/14  0848 History   First MD Initiated Contact with Patient 02/23/14 [phone]     Chief Complaint  Patient presents with  . Fall     (Consider location/radiation/quality/duration/timing/severity/associated sxs/prior Treatment)  Yolanda [name] is a 58 y.o. female who presents to the Emergency Department complaining of pain to the right shoulder, elbow, neck and wrist that began suddenly this morning after a mechanical fall in the shower.  She states that she was sitting on a shower chair and the leg of the chair broke causing her to fall against the edge of shower and the door.  She denies dizziness, chest pain, shortness of breath, back pain, head injury or LOC.  Pain is worse with any attempted movement of the right arm or rotation of the neck.    Patient is a 58 y.o. female presenting with fall. The history is provided by the patient.  Fall This is a new problem. The current episode started today. The problem occurs constantly. The problem has been unchanged. Associated symptoms include arthralgias, joint swelling and neck pain. Pertinent negatives include no abdominal pain, chest pain, chills, fever, headaches, myalgias, nausea, numbness, rash, swollen glands, vertigo, visual change, vomiting or weakness. The symptoms are aggravated by bending (movemetn of the right arm or palpation of the shoulder). She has tried nothing for the symptoms. The treatment provided no relief.    Past Medical History  Diagnosis Date  . Fibromyalgia   . Hypertension   . PE (pulmonary embolism)   . Depression   . Chronic pain in left foot   . Chronic abdominal pain   . IBS (irritable bowel syndrome)   . Gastritis   . Internal hemorrhoids   . Kidney stone   . Thyroid disease   . Sleep apnea    Past Surgical History  Procedure Laterality Date  . Cesarean section    . Breast lumpectomy    . Dilation and curettage of uterus    . Abdominal surgery    .  Cystoscopy w/ retrogrades  01/23/2012    Procedure: CYSTOSCOPY WITH RETROGRADE PYELOGRAM;  Surgeon: Marissa Nestle, MD;  Location: [facility];  Service: Urology;  Laterality: Left;  . Removal of stones  01/23/2012    Procedure: REMOVAL OF STONES;  Surgeon: Marissa Nestle, MD;  Location: [facility];  Service: Urology;  Laterality: N/A;  . Colonoscopy  2006    internal hemorrhoids   Family History  Problem Relation Age of Onset  . Heart attack Mother     CABG  . Heart attack Father     CABG  . Heart attack Brother   . Stroke Mother   . Hypertension Mother   . Hypertension Father    History  Substance Use Topics  . Smoking status: Former Research scientist (life sciences)  . Smokeless tobacco: Not on file  . Alcohol Use: Yes     Comment: ocassionally   OB History   Grav Para Term Preterm Abortions TAB SAB Ect Mult Living                 Review of Systems  Constitutional: Negative for fever and chills.  Respiratory: Negative for chest tightness and shortness of breath.   Cardiovascular: Negative for chest pain.  Gastrointestinal: Negative for nausea, vomiting and abdominal pain.  Genitourinary: Negative for dysuria, flank pain and difficulty urinating.  Musculoskeletal: Positive for arthralgias, joint swelling and neck pain. Negative for myalgias.  Skin:  Negative for color change and rash.       Abrasion right hand  Neurological: Negative for vertigo, weakness, numbness and headaches.  All other systems reviewed and are negative.     Allergies  Sulfonamide derivatives  Home Medications   Prior to Admission medications   Medication Sig Start Date End Date Taking? Authorizing Provider  ALPRAZolam Duanne Moron) 1 MG tablet Take 1 mg by mouth 3 (three) times daily as needed. Anxiety   Yes Historical Provider, MD  CALCIUM PO Take 1 tablet by mouth daily.   Yes Historical Provider, MD  Cholecalciferol (VITAMIN D3) 2000 UNITS TABS Take 1 tablet by mouth daily.   Yes Historical Provider, MD  cycloSPORINE  (RESTASIS) 0.05 % ophthalmic emulsion Place 1 drop into both eyes 2 (two) times daily.   Yes Historical Provider, MD  docusate sodium (COLACE) 100 MG capsule Take 100 mg by mouth at bedtime.   Yes Historical Provider, MD  fish oil-omega-3 fatty acids 1000 MG capsule Take 1 g by mouth at bedtime.    Yes Historical Provider, MD  fluticasone (FLONASE) 50 MCG/ACT nasal spray Place 1-2 sprays into both nostrils daily.   Yes Historical Provider, MD  ibuprofen (ADVIL,MOTRIN) 200 MG tablet Take 200 mg by mouth as needed for mild pain.    Yes Historical Provider, MD  levothyroxine (SYNTHROID, LEVOTHROID) 75 MCG tablet Take 75 mcg by mouth daily before breakfast.   Yes Historical Provider, MD  MAGNESIUM PO Take 1 tablet by mouth at bedtime.    Yes Historical Provider, MD  meclizine (ANTIVERT) 25 MG tablet Take 25 mg by mouth 3 (three) times daily as needed. Dizziness   Yes Historical Provider, MD  methylphenidate (CONCERTA) 36 MG CR tablet Take 36 mg by mouth daily.   Yes Historical Provider, MD  Milnacipran HCl (SAVELLA) 100 MG TABS tablet Take 100 mg by mouth 2 (two) times daily.   Yes Historical Provider, MD  ondansetron (ZOFRAN ODT) 4 MG disintegrating tablet Take 1 tablet (4 mg total) by mouth every 8 (eight) hours as needed for nausea or vomiting. 01/29/14  Yes [name], MD  pantoprazole (PROTONIX) 40 MG tablet Take 1 tablet (40 mg total) by mouth daily. 09/15/12  Yes Nita Sells, MD  telmisartan (MICARDIS) 40 MG tablet Take 40 mg by mouth daily.   Yes Historical Provider, MD  traMADol (ULTRAM) 50 MG tablet Take 50-100 mg by mouth 3 (three) times daily.   Yes Historical Provider, MD   BP 156/68  Pulse 85  Temp(Src) 98.1 F (36.7 C) (Oral)  Resp 16  Ht 5' 4"  (1.626 m)  Wt 230 lb (104.327 kg)  BMI 39.46 kg/m2  SpO2 100% Physical Exam  Nursing note and vitals reviewed. Constitutional: She is oriented to person, place, and time. She appears well-developed and well-nourished. No  distress.  HENT:  Head: Normocephalic and atraumatic.  Mouth/Throat: Oropharynx is clear and moist.  Eyes: Conjunctivae and EOM are normal. Pupils are equal, round, and reactive to light.  Neck: Phonation normal. Neck supple. Muscular tenderness present. No spinous process tenderness present. Normal range of motion present. No thyromegaly present.    Localized ttp of the right cervical paraspinal muscles.  No posterior tenderness or step off deformity  Cardiovascular: Normal rate, regular rhythm, normal heart sounds and intact distal pulses.   No murmur heard. Pulmonary/Chest: Effort normal and breath sounds normal. No respiratory distress. She exhibits no tenderness.  Abdominal: Soft. She exhibits no distension. There is no tenderness. There is no  rebound and no guarding.  Musculoskeletal: She exhibits edema and tenderness.       Right shoulder: She exhibits decreased range of motion, tenderness, bony tenderness and swelling. She exhibits no effusion, no crepitus, no deformity, normal pulse and normal strength.       Arms: ttp of the anterior shoulder and along the clavical.  Patient unable to abduct right arm due to level of pain. Moderate edema and tenderness over the right clavicle.  Radial pulse is brisk, distal sensation intact, CR< 2 sec. Grip strength is strong and symmetrical.   No edema , erythema or step-off deformity of the joint. Superficial abrasion to lateral right hand.  Mild tenderness to right elbow and wrist, without deformity  Lymphadenopathy:    She has no cervical adenopathy.  Neurological: She is alert and oriented to person, place, and time. She has normal strength. No sensory deficit. She exhibits normal muscle tone. Coordination normal.  Skin: Skin is warm and dry.    ED Course  Procedures (including critical care time) Labs Review Labs Reviewed - No data to display  Imaging Review Dg Cervical Spine Complete  02/23/2014   CLINICAL DATA:  Neck pain.  EXAM:  CERVICAL SPINE  4+ VIEWS  COMPARISON:  None.  FINDINGS: Diffuse multilevel degenerative change cervical spine. No evidence of fracture or dislocation. Normal bony alignment . Carotid vascular calcification consistent with atherosclerotic vascular disease. Pulmonary apices are clear.  IMPRESSION: 1.  Diffuse degenerative change, no acute abnormality.  2.  Carotid atherosclerotic vascular disease.   Electronically Signed   By: Marcello Moores  Register   On: 02/23/2014 11:44   Dg Clavicle Right  02/23/2014   CLINICAL DATA:  Shoulder pain after a fall.  EXAM: RIGHT CLAVICLE - 2+ VIEWS  COMPARISON:  None.  FINDINGS: There is a comminuted fracture of the distal right clavicle with apex superior angulation of the fracture fragments. Slight elevation of the distal right clavicle is seen with respect to the acromion. Visualized portion of the upper right chest is unremarkable.  IMPRESSION: Comminuted distal right clavicle fracture. Possible associated right acromioclavicular joint injury.   Electronically Signed   By: Lorin Picket M.D.   On: 02/23/2014 11:26   Dg Shoulder Right  02/23/2014   CLINICAL DATA:  Pain post trauma  EXAM: RIGHT SHOULDER - 2+ VIEW  COMPARISON:  None.  FINDINGS: Frontal and Y scapular images were obtained. There is a comminuted fracture of the lateral right clavicle. There is a bony fragment displaced inferior to the body of the clavicle. The body of the clavicle is displaced superiorly with the inferior bony fragment remaining at the level of the coracoid clavicular joint. There does appear to be some mild acromioclavicular separation. No dislocation. No other fracture.  IMPRESSION: Comminuted fracture distal right clavicle with partial acromioclavicular separation. The inferior fracture fragment maintains the coracoid clavicular distance with the body of the distal clavicle otherwise superiorly displaced. No frank dislocation.   Electronically Signed   By: Lowella Grip M.D.   On: 02/23/2014  11:26   Dg Elbow Complete Right  02/23/2014   CLINICAL DATA:  Right shoulder and right clavicle pain after a fall.  EXAM: RIGHT ELBOW - COMPLETE 3+ VIEW  COMPARISON:  None.  FINDINGS: No acute osseous or joint abnormality.  IMPRESSION: No acute osseous or joint abnormality.   Electronically Signed   By: Lorin Picket M.D.   On: 02/23/2014 11:25   Dg Wrist Complete Right  02/23/2014   CLINICAL DATA:  Pain after a fall.  EXAM: RIGHT WRIST - COMPLETE 3+ VIEW  COMPARISON:  None.  FINDINGS: No acute osseous or joint abnormality.  IMPRESSION: No acute osseous or joint abnormality.   Electronically Signed   By: Lorin Picket M.D.   On: 02/23/2014 11:25     EKG Interpretation None      MDM   Final diagnoses:  Clavicle fracture, right, closed, initial encounter  AC separation, right, initial encounter      Consulted Dr. Aline Brochure and XR results discussed.  Recommended shoulder immob and he will see pt in his office for f/u.   Pt is feeling better after medications and appears stable for d/c.  She agrees to care plan and orthopedic follow-up.  rx for percocet  Halden Phegley L. Vanessa Brookings, PA-C 02/23/14 1653

## 2014-02-26 ENCOUNTER — Encounter: Payer: Self-pay | Admitting: Orthopedic Surgery

## 2014-02-26 ENCOUNTER — Ambulatory Visit (INDEPENDENT_AMBULATORY_CARE_PROVIDER_SITE_OTHER): Payer: PRIVATE HEALTH INSURANCE | Admitting: Orthopedic Surgery

## 2014-02-26 VITALS — BP 143/79 | Ht 64.0 in | Wt 230.0 lb

## 2014-02-26 DIAGNOSIS — S42031A Displaced fracture of lateral end of right clavicle, initial encounter for closed fracture: Secondary | ICD-10-CM

## 2014-02-26 DIAGNOSIS — S42033A Displaced fracture of lateral end of unspecified clavicle, initial encounter for closed fracture: Secondary | ICD-10-CM

## 2014-02-26 MED ORDER — OXYCODONE-ACETAMINOPHEN 5-325 MG PO TABS: 1.0000 | ORAL_TABLET | ORAL | Status: DC | PRN

## 2014-02-26 NOTE — Progress Notes (Signed)
Chief Complaint  Patient presents with  . 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 Date of injury August 17. Initial evaluation in the emergency room shows a type II nondisplaced distal clavicle fracture/a.c. joint type injury. Complains of pain swelling some numbness in the hand aching in the shoulder neck and upper arm as well as the wrist.  X-rays of the wrist and forearm were negative. Shoulder x-ray was notable for a C. type injury. Pain level is a 9/10 she is on Percocet Zofran and ibuprofen.  Past Medical History  Diagnosis Date  . Fibromyalgia   . Hypertension   . PE (pulmonary embolism)   . Depression   . Chronic pain in left foot   . Chronic abdominal pain   . IBS (irritable bowel syndrome)   . Gastritis   . Internal hemorrhoids   . Kidney stone   . Thyroid disease   . Sleep apnea    Past Surgical History  Procedure Laterality Date  . Cesarean section    . Breast lumpectomy    . Dilation and curettage of uterus    . Abdominal surgery    . Cystoscopy w/ retrogrades  01/23/2012    Procedure: CYSTOSCOPY WITH RETROGRADE PYELOGRAM;  Surgeon: Marissa Nestle, MD;  Location: [facility];  Service: Urology;  Laterality: Left;  . Removal of stones  01/23/2012    Procedure: REMOVAL OF STONES;  Surgeon: Marissa Nestle, MD;  Location: [facility];  Service: Urology;  Laterality: N/A;  . Colonoscopy  2006    internal hemorrhoids   History  Substance Use Topics  . Smoking status: Former Research scientist (life sciences)  . Smokeless tobacco: Not on file  . Alcohol Use: Yes     Comment: ocassionally    General appearance is normal, the patient is alert and oriented x3 with normal mood and affect. Other than the mild obesity she is ambulatory without assistive device she is in  a shoulder immobilizer. We removed the immobilizer for further evaluation. She was tenderness in the cervical spine upper shoulder and over the distal clavicle. Her shoulder motion was not tested she has normal passive range of motion in her hand decreased active flexion. There is no instability in the wrist or elbow and wrist is nontender muscle tone is normal skin is intact without laceration or tingling. Has good distal radial pulse and normal gross sensation with some numbness and tingling felt in the hand. She has no lymphadenopathy and her balance was good when she walks with a shoulder immobilizer on  Her x-rays show a nondisplaced type II distal clavicle fracture/a.c. separation  Recommend continue shoulder immobilization for 3 weeks and repeat x-ray of note movement than physical therapy can be initiated  She should be out of work for 6 weeks.  Meds ordered this encounter  Medications  . oxyCODONE-acetaminophen (PERCOCET/ROXICET) 5-325 MG per tablet    Sig: Take 1 tablet by mouth every 4 (four) hours as needed.    Dispense:  84 tablet    Refill:  0

## 2014-02-26 NOTE — Patient Instructions (Signed)
Out of work 6 weeks Shoulder immobilzer 6 weeks

## 2014-03-11 ENCOUNTER — Other Ambulatory Visit: Payer: Self-pay

## 2014-03-11 ENCOUNTER — Ambulatory Visit (INDEPENDENT_AMBULATORY_CARE_PROVIDER_SITE_OTHER): Payer: PRIVATE HEALTH INSURANCE | Admitting: Gastroenterology

## 2014-03-11 ENCOUNTER — Encounter: Payer: Self-pay | Admitting: Gastroenterology

## 2014-03-11 VITALS — BP 147/77 | HR 86 | Temp 97.4°F | Ht 64.0 in | Wt 242.0 lb

## 2014-03-11 DIAGNOSIS — R1013 Epigastric pain: Secondary | ICD-10-CM | POA: Insufficient documentation

## 2014-03-11 DIAGNOSIS — K760 Fatty (change of) liver, not elsewhere classified: Secondary | ICD-10-CM

## 2014-03-11 DIAGNOSIS — K625 Hemorrhage of anus and rectum: Secondary | ICD-10-CM

## 2014-03-11 DIAGNOSIS — K7689 Other specified diseases of liver: Secondary | ICD-10-CM

## 2014-03-11 DIAGNOSIS — K3189 Other diseases of stomach and duodenum: Secondary | ICD-10-CM

## 2014-03-11 MED ORDER — PEG 3350-KCL-NA BICARB-NACL 420 G PO SOLR: 4000.0000 mL | ORAL | Status: DC

## 2014-03-11 NOTE — Progress Notes (Signed)
Primary Care Physician:  Purvis Kilts, MD Primary Gastroenterologist:  Dr. [name]   Chief Complaint  Patient presents with  . Colonoscopy  . Other    fatty liver     HPI:   Yolanda [name] presents today with need for screening colonoscopy. Last in 2006 with internal hemorrhoids. [place] of abdomen recently in July 2015 with normal gallbladder, diffuse hepatic steatosis. Intermittent low-volume hematochezia noted. Went through a spell where food wasn't settling right but now ok. Notes epigastric discomfort for several months. Not associated with food or aggravated with food. Feels more like across entire upper abdomen. No lack of appetite. History of IBS. Now with predominant constipation. Feels like stool softener doing well each evening. Protonix each day.   Past Medical History  Diagnosis Date  . Fibromyalgia   . Hypertension   . PE (pulmonary embolism)   . Depression   . Chronic pain in left foot   . Chronic abdominal pain   . IBS (irritable bowel syndrome)   . Gastritis   . Internal hemorrhoids   . Kidney stone   . Thyroid disease   . Sleep apnea     hasn't worn a CPAP in 6 months    Past Surgical History  Procedure Laterality Date  . Cesarean section      X2  . Breast lumpectomy    . Dilation and curettage of uterus    . Abdominal surgery      laparoscopy  . Cystoscopy w/ retrogrades  01/23/2012    Procedure: CYSTOSCOPY WITH RETROGRADE PYELOGRAM;  Surgeon: Marissa Nestle, MD;  Location: [facility];  Service: Urology;  Laterality: Left;  . Removal of stones  01/23/2012    Procedure: REMOVAL OF STONES;  Surgeon: Marissa Nestle, MD;  Location: [facility];  Service: Urology;  Laterality: N/A;  . Colonoscopy  2006    internal hemorrhoids  . Esophagogastroduodenoscopy   08/24/2006    Dr. Veto Kemps erythema of the antrum without erosion or ulcers/Otherwise, normal esophagus without evidence of Barrett's path with chronic gastritis    Current Outpatient  Prescriptions  Medication Sig Dispense Refill  . ALPRAZolam (XANAX) 1 MG tablet Take 1 mg by mouth 3 (three) times daily as needed. Anxiety      . CALCIUM PO Take 1 tablet by mouth daily.      . Cholecalciferol (VITAMIN D3) 2000 UNITS TABS Take 1 tablet by mouth daily.      . cycloSPORINE (RESTASIS) 0.05 % ophthalmic emulsion Place 1 drop into both eyes 2 (two) times daily.      Marland Kitchen docusate sodium (COLACE) 100 MG capsule Take 100 mg by mouth at bedtime.      . fish oil-omega-3 fatty acids 1000 MG capsule Take 1 g by mouth at bedtime.       . fluticasone (FLONASE) 50 MCG/ACT nasal spray Place 1-2 sprays into both nostrils daily.      Marland Kitchen ibuprofen (ADVIL,MOTRIN) 200 MG tablet Take 200 mg by mouth as needed for mild pain.       Marland Kitchen levothyroxine (SYNTHROID, LEVOTHROID) 75 MCG tablet Take 75 mcg by mouth daily before breakfast.      . MAGNESIUM PO Take 1 tablet by mouth at bedtime.       . meclizine (ANTIVERT) 25 MG tablet Take 25 mg by mouth 3 (three) times daily as needed. Dizziness      . methylphenidate (CONCERTA) 36 MG CR tablet Take 36 mg by mouth daily.      Marland Kitchen  Milnacipran HCl (SAVELLA) 100 MG TABS tablet Take 100 mg by mouth 2 (two) times daily.      . ondansetron (ZOFRAN ODT) 4 MG disintegrating tablet Take 1 tablet (4 mg total) by mouth every 8 (eight) hours as needed for nausea or vomiting.  20 tablet  0  . oxyCODONE-acetaminophen (PERCOCET/ROXICET) 5-325 MG per tablet Take 1 tablet by mouth every 4 (four) hours as needed.  84 tablet  0  . pantoprazole (PROTONIX) 40 MG tablet Take 1 tablet (40 mg total) by mouth daily.  30 tablet  0  . telmisartan (MICARDIS) 40 MG tablet Take 40 mg by mouth daily.      . traMADol (ULTRAM) 50 MG tablet Take 50-100 mg by mouth 3 (three) times daily.       No current facility-administered medications for this visit.    Allergies as of 03/11/2014 - Review Complete 03/11/2014  Allergen Reaction Noted  . Sulfonamide derivatives Other (See Comments)      Family History  Problem Relation Age of Onset  . Heart attack Mother     CABG  . Heart attack Father     CABG  . Heart attack Brother   . Stroke Mother   . Hypertension Mother   . Hypertension Father   . Colon cancer Neg Hx     History   Social History  . Marital Status: Divorced    Spouse Name: N/A    Number of Children: N/A  . Years of Education: N/A   Occupational History  . [employer]    Social History Main Topics  . Smoking status: Former Research scientist (life sciences)  . Smokeless tobacco: Not on file  . Alcohol Use: Yes     Comment: ocassionally  . Drug Use: No  . Sexual Activity: No   Other Topics Concern  . Not on file   Social History Narrative  . No narrative on file    Review of Systems: Gen: see HPI CV: recent episode of chest pain, negative cardiac work-up Resp: Denies shortness of breath at rest or with exertion. Denies wheezing or cough.  GI: see HPI GU : urinary urgency MS: arm in sling, broke clavicle Derm: Denies rash, itching, dry skin Psych: depression/anxiety at baseline Heme: Denies bruising, bleeding, and enlarged lymph nodes.  Physical Exam: BP 147/77  Pulse 86  Temp(Src) 97.4 F (36.3 C) (Oral)  Ht 5' 4"  (1.626 m)  Wt 242 lb (109.77 kg)  BMI 41.52 kg/m2 General:   Alert and oriented. Pleasant and cooperative. Well-nourished and well-developed.  Head:  Normocephalic and atraumatic. Eyes:  Without icterus, sclera clear and conjunctiva pink.  Ears:  Normal auditory acuity. Nose:  No deformity, discharge,  or lesions. Mouth:  No deformity or lesions, oral mucosa pink.  Lungs:  Clear to auscultation bilaterally. No wheezes, rales, or rhonchi. No distress.  Heart:  S1, S2 present without murmurs appreciated.  Abdomen:  +BS, soft, TTP RUQ and non-distended. No HSM noted. No guarding or rebound. No masses appreciated.  Rectal:  Deferred  Msk:  Symmetrical without gross deformities. Right arm in sling Extremities:  Chronic venous  stasis changes Neurologic:  Alert and  oriented x4;  grossly normal neurologically. Skin:  Intact without significant lesions or rashes. Psych:  Alert and cooperative. Normal mood and affect.   Lab Results  Component Value Date   WBC 9.2 01/29/2014   HGB 15.5* 01/29/2014   HCT 45.3 01/29/2014   MCV 89.9 01/29/2014   PLT 276 01/29/2014  Lab Results  Component Value Date   ALT 21 03/11/2014   AST 22 03/11/2014   ALKPHOS 101 03/11/2014   BILITOT 0.4 03/11/2014

## 2014-03-11 NOTE — Patient Instructions (Signed)
Please have blood work done today. We have also scheduled you for a special ultrasound of your liver.   You have been scheduled for a colonoscopy with an upper endoscopy with Dr. [name] in the near future.

## 2014-03-12 ENCOUNTER — Encounter (HOSPITAL_COMMUNITY): Payer: Self-pay | Admitting: Pharmacy Technician

## 2014-03-13 NOTE — Patient Instructions (Addendum)
Yolanda [name]  03/13/2014   Your procedure is scheduled on:  Tuesday, 03/24/14  Report to [facility] at 0730 AM.  Call this number if you have problems the morning of surgery: [phone]   Remember:   Do not eat food or drink liquids after midnight.   Take these medicines the morning of surgery with A SIP OF WATER: xanax, levothyroxine, antivert, concerta,savella, ultram,  zofran, protonix, micardis, and percocet if needed.   Do not wear jewelry, make-up or nail polish.  Do not wear lotions, powders, or perfumes.   Do not shave 48 hours prior to surgery. Men may shave face and neck.  Do not bring valuables to the hospital.  [facility] is not responsible  for any belongings or valuables.               Contacts, dentures or bridgework may not be worn into surgery.  Leave suitcase in the car. After surgery it may be brought to your room.  For patients admitted to the hospital, discharge time is determined by your treatment team.               Patients discharged the day of surgery will not be allowed to drive  home.  Name and phone number of your driver: family  Special Instructions: N/A   Please read over the following fact sheets that you were given: Anesthesia Post-op Instructions and Care and Recovery After Surgery  Monitored Anesthesia Care Monitored anesthesia care is an anesthesia service for a medical procedure. Anesthesia is the loss of the ability to feel pain. It is produced by medicines called anesthetics. It may affect a small area of your body (local anesthesia), a large area of your body (regional anesthesia), or your entire body (general anesthesia). The need for monitored anesthesia care depends your procedure, your condition, and the potential need for regional or general anesthesia. It is often provided during procedures where:   General anesthesia may be needed if there are complications. This is because you need special care when you are under general anesthesia.    You will be under local or regional anesthesia. This is so that you are able to have higher levels of anesthesia if needed.   You will receive calming medicines (sedatives). This is especially the case if sedatives are given to put you in a semi-conscious state of relaxation (deep sedation). This is because the amount of sedative needed to produce this state can be hard to predict. Too much of a sedative can produce general anesthesia. Monitored anesthesia care is performed by one or more health care providers who have special training in all types of anesthesia. You will need to meet with these health care providers before your procedure. During this meeting, they will ask you about your medical history. They will also give you instructions to follow. (For example, you will need to stop eating and drinking before your procedure. You may also need to stop or change medicines you are taking.) During your procedure, your health care providers will stay with you. They will:   Watch your condition. This includes watching your blood pressure, breathing, and level of pain.   Diagnose and treat problems that occur.   Give medicines if they are needed. These may include calming medicines (sedatives) and anesthetics.   Make sure you are comfortable.  Having monitored anesthesia care does not necessarily mean that you will be under anesthesia. It does mean that your health care providers will be able  to manage anesthesia if you need it or if it occurs. It also means that you will be able to have a different type of anesthesia than you are having if you need it. When your procedure is complete, your health care providers will continue to watch your condition. They will make sure any medicines wear off before you are allowed to go home.  Document Released: 03/22/2005 Document Revised: 11/10/2013 Document Reviewed: 08/07/2012 [facility] Patient Information 2015 [place], [place]. This information is not  intended to replace advice given to you by your health care provider. Make sure you discuss any questions you have with your health care provider. Colonoscopy A colonoscopy is an exam to look at the entire large intestine (colon). This exam can help find problems such as tumors, polyps, inflammation, and areas of bleeding. The exam takes about 1 hour.  LET [facility] CARE PROVIDER KNOW ABOUT:   Any allergies you have.  All medicines you are taking, including vitamins, herbs, eye drops, creams, and over-the-counter medicines.  Previous problems you or members of your family have had with the use of anesthetics.  Any blood disorders you have.  Previous surgeries you have had.  Medical conditions you have. RISKS AND COMPLICATIONS  Generally, this is a safe procedure. However, as with any procedure, complications can occur. Possible complications include:  Bleeding.  Tearing or rupture of the colon wall.  Reaction to medicines given during the exam.  Infection (rare). BEFORE THE PROCEDURE   Ask your health care provider about changing or stopping your regular medicines.  You may be prescribed an oral bowel prep. This involves drinking a large amount of medicated liquid, starting the day before your procedure. The liquid will cause you to have multiple loose stools until your stool is almost clear or light green. This cleans out your colon in preparation for the procedure.  Do not eat or drink anything else once you have started the bowel prep, unless your health care provider tells you it is safe to do so.  Arrange for someone to drive you home after the procedure. PROCEDURE   You will be given medicine to help you relax (sedative).  You will lie on your side with your knees bent.  A long, flexible tube with a light and camera on the end (colonoscope) will be inserted through the rectum and into the colon. The camera sends video back to a computer screen as it moves through the  colon. The colonoscope also releases carbon dioxide gas to inflate the colon. This helps your health care provider see the area better.  During the exam, your health care provider may take a small tissue sample (biopsy) to be examined under a microscope if any abnormalities are found.  The exam is finished when the entire colon has been viewed. AFTER THE PROCEDURE   Do not drive for 24 hours after the exam.  You may have a small amount of blood in your stool.  You may pass moderate amounts of gas and have mild abdominal cramping or bloating. This is caused by the gas used to inflate your colon during the exam.  Ask when your test results will be ready and how you will get your results. Make sure you get your test results. Document Released: 06/23/2000 Document Revised: 04/16/2013 Document Reviewed: 03/03/2013 [facility] Patient Information 2015 [place], [place]. This information is not intended to replace advice given to you by your health care provider. Make sure you discuss any questions you have with your  health care provider. Colonoscopy, Care After Refer to this sheet in the next few weeks. These instructions provide you with information on caring for yourself after your procedure. Your health care provider may also give you more specific instructions. Your treatment has been planned according to current medical practices, but problems sometimes occur. Call your health care provider if you have any problems or questions after your procedure. WHAT TO EXPECT AFTER THE PROCEDURE  After your procedure, it is typical to have the following:  A small amount of blood in your stool.  Moderate amounts of gas and mild abdominal cramping or bloating. HOME CARE INSTRUCTIONS  Do not drive, operate machinery, or sign important documents for 24 hours.  You may shower and resume your regular physical activities, but move at a slower pace for the first 24 hours.  Take frequent rest periods for the  first 24 hours.  Walk around or put a warm pack on your abdomen to help reduce abdominal cramping and bloating.  Drink enough fluids to keep your urine clear or pale yellow.  You may resume your normal diet as instructed by your health care provider. Avoid heavy or fried foods that are hard to digest.  Avoid drinking alcohol for 24 hours or as instructed by your health care provider.  Only take over-the-counter or prescription medicines as directed by your health care provider.  If a tissue sample (biopsy) was taken during your procedure:  Do not take aspirin or blood thinners for 7 days, or as instructed by your health care provider.  Do not drink alcohol for 7 days, or as instructed by your health care provider.  Eat soft foods for the first 24 hours. SEEK MEDICAL CARE IF: You have persistent spotting of blood in your stool 2-3 days after the procedure. SEEK IMMEDIATE MEDICAL CARE IF:  You have more than a small spotting of blood in your stool.  You pass large blood clots in your stool.  Your abdomen is swollen (distended).  You have nausea or vomiting.  You have a fever.  You have increasing abdominal pain that is not relieved with medicine. Document Released: 02/08/2004 Document Revised: 04/16/2013 Document Reviewed: 03/03/2013 [facility] Patient Information 2015 [place], [place]. This information is not intended to replace advice given to you by your health care provider. Make sure you discuss any questions you have with your health care provider.

## 2014-03-14 LAB — HEPATIC FUNCTION PANEL
ALT: 21 U/L (ref 0–35)
AST: 22 U/L (ref 0–37)
Albumin: 3.9 g/dL (ref 3.5–5.2)
Alkaline Phosphatase: 101 U/L (ref 39–117)
BILIRUBIN DIRECT: 0.1 mg/dL (ref 0.0–0.3)
BILIRUBIN INDIRECT: 0.3 mg/dL (ref 0.2–1.2)
Total Bilirubin: 0.4 mg/dL (ref 0.2–1.2)
Total Protein: 7.2 g/dL (ref 6.0–8.3)

## 2014-03-17 ENCOUNTER — Encounter (HOSPITAL_COMMUNITY)
Admission: RE | Admit: 2014-03-17 | Discharge: 2014-03-17 | Disposition: A | Discharge status: Home / Self Care | Payer: PRIVATE HEALTH INSURANCE | Source: Ambulatory Visit | Attending: Gastroenterology | Admitting: Gastroenterology

## 2014-03-17 ENCOUNTER — Encounter: Payer: Self-pay | Admitting: Gastroenterology

## 2014-03-17 DIAGNOSIS — K760 Fatty (change of) liver, not elsewhere classified: Secondary | ICD-10-CM | POA: Insufficient documentation

## 2014-03-17 DIAGNOSIS — K625 Hemorrhage of anus and rectum: Secondary | ICD-10-CM | POA: Insufficient documentation

## 2014-03-17 NOTE — Assessment & Plan Note (Signed)
Check [place] with elastography. Recent LFTs normal.

## 2014-03-17 NOTE — Assessment & Plan Note (Addendum)
Likely benign anorectal source. Last colonoscopy 2006 with internal hemorrhoids. Constipation controlled with stool softener each evening, which she would like to continue.  Proceed with colonoscopy with Dr. [name] in the near future. The risks, benefits, and alternatives have been discussed in detail with the patient. They state understanding and desire to proceed.  PROPOFOL DUE TO POLYPHARMACY

## 2014-03-17 NOTE — Assessment & Plan Note (Addendum)
58 year old female with vague epigastric discomfort for several months without any aggravating or relieving factors. No improvement with Protonix daily. Gallbladder remains in situ. Gastritis, PUD, less likely malignancy remain in differential. Biliary etiology unable to be excluded but also less likely. With last EGD in 2008, will proceed with repeat upper GI evaluation.  Proceed with upper endoscopy in the near future with Dr. [name]. The risks, benefits, and alternatives have been discussed in detail with patient. They have stated understanding and desire to proceed.  PROPOFOL DUE TO POLYPHARMACY

## 2014-03-17 NOTE — Addendum Note (Signed)
Addended by: Orvil Feil on: 03/17/2014 04:45 PM   Modules accepted: Level of Service

## 2014-03-18 NOTE — Progress Notes (Signed)
Quick Note:  LFTs normal ______

## 2014-03-18 NOTE — Progress Notes (Signed)
cc'ed to pcp °

## 2014-03-19 ENCOUNTER — Ambulatory Visit (INDEPENDENT_AMBULATORY_CARE_PROVIDER_SITE_OTHER): Payer: Self-pay | Admitting: Orthopedic Surgery

## 2014-03-19 ENCOUNTER — Ambulatory Visit (INDEPENDENT_AMBULATORY_CARE_PROVIDER_SITE_OTHER): Payer: PRIVATE HEALTH INSURANCE

## 2014-03-19 ENCOUNTER — Encounter: Payer: Self-pay | Admitting: Orthopedic Surgery

## 2014-03-19 VITALS — BP 161/102 | Ht 64.0 in | Wt 242.0 lb

## 2014-03-19 DIAGNOSIS — S42033A Displaced fracture of lateral end of unspecified clavicle, initial encounter for closed fracture: Secondary | ICD-10-CM

## 2014-03-19 DIAGNOSIS — IMO0001 Reserved for inherently not codable concepts without codable children: Secondary | ICD-10-CM

## 2014-03-19 DIAGNOSIS — S42001D Fracture of unspecified part of right clavicle, subsequent encounter for fracture with routine healing: Secondary | ICD-10-CM

## 2014-03-19 DIAGNOSIS — S42001A Fracture of unspecified part of right clavicle, initial encounter for closed fracture: Secondary | ICD-10-CM | POA: Insufficient documentation

## 2014-03-19 DIAGNOSIS — S42031A Displaced fracture of lateral end of right clavicle, initial encounter for closed fracture: Secondary | ICD-10-CM

## 2014-03-19 MED ORDER — OXYCODONE-ACETAMINOPHEN 5-325 MG PO TABS: 1.0000 | ORAL_TABLET | ORAL | Status: DC | PRN

## 2014-03-19 NOTE — Progress Notes (Signed)
Fracture care followup  Chief Complaint  Patient presents with  . Follow-up    3 week recheck and xray Right clavicle fx, DOI 02/23/14    Encounter Diagnoses  Name Primary?  . Right clavicle fracture, with routine healing, subsequent encounter   . Closed fracture of distal clavicle, right, initial encounter Yes    BP 161/102  Ht 5' 4"  (1.626 m)  Wt 242 lb (109.77 kg)  BMI 41.52 kg/m2  The patient complains of soreness in the trapezius muscle, neck, scapula and upper shoulder area.  She has some ecchymosis around the anterior deltoid skin she has normal strength in her hand without numbness or tingling.  Her x-ray shows a comminuted distal clavicle fracture with intact a.c. joint.  No displacement of the 2 fracture and some.  We refilled her Roxicodone She can start occupational therapy with passive range of motion, active assisted range of motion and passive resistance exercises for the right hand She is changed from a shoulder immobilizer to a regular sling Followup in 4 weeks Out of work through October 11 followup here on October 8

## 2014-03-19 NOTE — Patient Instructions (Signed)
CALL TO ARRANGE THERAPY  OUT OF WORK THRU OCT 11

## 2014-03-20 ENCOUNTER — Ambulatory Visit (HOSPITAL_COMMUNITY)
Admission: RE | Admit: 2014-03-20 | Discharge: 2014-03-20 | Disposition: A | Discharge status: Home / Self Care | Payer: PRIVATE HEALTH INSURANCE | Source: Ambulatory Visit | Attending: Gastroenterology | Admitting: Gastroenterology

## 2014-03-20 DIAGNOSIS — K7689 Other specified diseases of liver: Secondary | ICD-10-CM | POA: Insufficient documentation

## 2014-03-20 DIAGNOSIS — K760 Fatty (change of) liver, not elsewhere classified: Secondary | ICD-10-CM

## 2014-03-23 ENCOUNTER — Telehealth: Payer: Self-pay | Admitting: Orthopedic Surgery

## 2014-03-23 NOTE — Progress Notes (Signed)
Quick Note:  Pt is aware of results. ______ 

## 2014-03-23 NOTE — Telephone Encounter (Signed)
Faxed recent notes including work note, date of service 03/19/14, to [employer] short-term disability, fax [phone], ph [phone]; signed authorization on file.  Patient aware.

## 2014-03-24 ENCOUNTER — Ambulatory Visit (HOSPITAL_COMMUNITY): Payer: PRIVATE HEALTH INSURANCE | Admitting: Certified Registered Nurse Anesthetist

## 2014-03-24 ENCOUNTER — Encounter (HOSPITAL_COMMUNITY)
Admission: RE | Disposition: A | Discharge status: Home / Self Care | Payer: Self-pay | Source: Ambulatory Visit | Attending: Gastroenterology

## 2014-03-24 ENCOUNTER — Encounter (HOSPITAL_COMMUNITY): Payer: Self-pay | Admitting: *Deleted

## 2014-03-24 ENCOUNTER — Ambulatory Visit (HOSPITAL_COMMUNITY)
Admission: RE | Admit: 2014-03-24 | Discharge: 2014-03-24 | Disposition: A | Discharge status: Home / Self Care | Payer: PRIVATE HEALTH INSURANCE | Source: Ambulatory Visit | Attending: Gastroenterology | Admitting: Gastroenterology

## 2014-03-24 ENCOUNTER — Encounter (HOSPITAL_COMMUNITY): Payer: PRIVATE HEALTH INSURANCE | Admitting: Certified Registered Nurse Anesthetist

## 2014-03-24 DIAGNOSIS — F329 Major depressive disorder, single episode, unspecified: Secondary | ICD-10-CM | POA: Insufficient documentation

## 2014-03-24 DIAGNOSIS — K648 Other hemorrhoids: Secondary | ICD-10-CM | POA: Insufficient documentation

## 2014-03-24 DIAGNOSIS — K645 Perianal venous thrombosis: Secondary | ICD-10-CM | POA: Insufficient documentation

## 2014-03-24 DIAGNOSIS — Z79899 Other long term (current) drug therapy: Secondary | ICD-10-CM | POA: Insufficient documentation

## 2014-03-24 DIAGNOSIS — I10 Essential (primary) hypertension: Secondary | ICD-10-CM | POA: Diagnosis not present

## 2014-03-24 DIAGNOSIS — K625 Hemorrhage of anus and rectum: Secondary | ICD-10-CM

## 2014-03-24 DIAGNOSIS — Z87891 Personal history of nicotine dependence: Secondary | ICD-10-CM | POA: Diagnosis not present

## 2014-03-24 DIAGNOSIS — D126 Benign neoplasm of colon, unspecified: Secondary | ICD-10-CM | POA: Insufficient documentation

## 2014-03-24 DIAGNOSIS — K2980 Duodenitis without bleeding: Secondary | ICD-10-CM | POA: Diagnosis not present

## 2014-03-24 DIAGNOSIS — F3289 Other specified depressive episodes: Secondary | ICD-10-CM | POA: Insufficient documentation

## 2014-03-24 DIAGNOSIS — K2960 Other gastritis without bleeding: Secondary | ICD-10-CM

## 2014-03-24 DIAGNOSIS — R1013 Epigastric pain: Secondary | ICD-10-CM

## 2014-03-24 DIAGNOSIS — Z86711 Personal history of pulmonary embolism: Secondary | ICD-10-CM | POA: Insufficient documentation

## 2014-03-24 DIAGNOSIS — R1031 Right lower quadrant pain: Secondary | ICD-10-CM

## 2014-03-24 DIAGNOSIS — K3189 Other diseases of stomach and duodenum: Secondary | ICD-10-CM | POA: Diagnosis present

## 2014-03-24 DIAGNOSIS — K2940 Chronic atrophic gastritis without bleeding: Secondary | ICD-10-CM | POA: Diagnosis not present

## 2014-03-24 HISTORY — PX: POLYPECTOMY: SHX5525

## 2014-03-24 HISTORY — PX: ESOPHAGOGASTRODUODENOSCOPY (EGD) WITH PROPOFOL: SHX5813

## 2014-03-24 HISTORY — PX: BIOPSY: SHX5522

## 2014-03-24 HISTORY — PX: COLONOSCOPY WITH PROPOFOL: SHX5780

## 2014-03-24 SURGERY — COLONOSCOPY WITH PROPOFOL
Anesthesia: Monitor Anesthesia Care

## 2014-03-24 MED ORDER — MIDAZOLAM HCL 2 MG/2ML IJ SOLN
INTRAMUSCULAR | Status: AC
Start: 2014-03-24 — End: 2014-03-24
  Filled 2014-03-24: qty 2

## 2014-03-24 MED ORDER — STERILE WATER FOR IRRIGATION IR SOLN
Status: DC | PRN
  Administered 2014-03-24: 07:00:00

## 2014-03-24 MED ORDER — FENTANYL CITRATE 0.05 MG/ML IJ SOLN
INTRAMUSCULAR | Status: DC | PRN
  Administered 2014-03-24 (×3): 25 ug via INTRAVENOUS

## 2014-03-24 MED ORDER — LIDOCAINE VISCOUS 2 % MT SOLN
15.0000 mL | Freq: Once | OROMUCOSAL | Status: AC
  Administered 2014-03-24: 1 via OROMUCOSAL

## 2014-03-24 MED ORDER — PROPOFOL 10 MG/ML IV BOLUS
INTRAVENOUS | Status: AC
  Filled 2014-03-24: qty 20

## 2014-03-24 MED ORDER — FENTANYL CITRATE 0.05 MG/ML IJ SOLN
INTRAMUSCULAR | Status: AC
  Filled 2014-03-24: qty 2

## 2014-03-24 MED ORDER — WATER FOR IRRIGATION, STERILE IR SOLN
Status: DC | PRN
  Administered 2014-03-24: 1000 mL

## 2014-03-24 MED ORDER — ONDANSETRON HCL 4 MG/2ML IJ SOLN: 4.0000 mg | Freq: Once | INTRAMUSCULAR | Status: DC | PRN

## 2014-03-24 MED ORDER — PROPOFOL 10 MG/ML IV BOLUS
INTRAVENOUS | Status: DC | PRN
  Administered 2014-03-24 (×2): 50 mg via INTRAVENOUS

## 2014-03-24 MED ORDER — LIDOCAINE HCL (CARDIAC) 10 MG/ML IV SOLN
INTRAVENOUS | Status: DC | PRN
  Administered 2014-03-24: 60 mg via INTRAVENOUS

## 2014-03-24 MED ORDER — GLYCOPYRROLATE 0.2 MG/ML IJ SOLN
0.2000 mg | Freq: Once | INTRAMUSCULAR | Status: AC
  Administered 2014-03-24: 0.2 mg via INTRAVENOUS

## 2014-03-24 MED ORDER — LACTATED RINGERS IV SOLN: INTRAVENOUS | Status: DC | PRN

## 2014-03-24 MED ORDER — LACTATED RINGERS IV SOLN
INTRAVENOUS | Status: DC | PRN
Start: 2014-03-24 — End: 2014-03-24
  Administered 2014-03-24: 08:00:00 via INTRAVENOUS

## 2014-03-24 MED ORDER — ONDANSETRON HCL 4 MG/2ML IJ SOLN
4.0000 mg | Freq: Once | INTRAMUSCULAR | Status: AC
  Administered 2014-03-24: 4 mg via INTRAVENOUS

## 2014-03-24 MED ORDER — DEXAMETHASONE SODIUM PHOSPHATE 4 MG/ML IJ SOLN
INTRAMUSCULAR | Status: AC
  Filled 2014-03-24: qty 1

## 2014-03-24 MED ORDER — LACTATED RINGERS IV SOLN
INTRAVENOUS | Status: DC
  Administered 2014-03-24: 1000 mL via INTRAVENOUS

## 2014-03-24 MED ORDER — GLYCOPYRROLATE 0.2 MG/ML IJ SOLN
INTRAMUSCULAR | Status: AC
Start: 2014-03-24 — End: 2014-03-24
  Filled 2014-03-24: qty 1

## 2014-03-24 MED ORDER — DEXAMETHASONE SODIUM PHOSPHATE 4 MG/ML IJ SOLN
4.0000 mg | Freq: Once | INTRAMUSCULAR | Status: AC
  Administered 2014-03-24: 4 mg via INTRAVENOUS

## 2014-03-24 MED ORDER — LIDOCAINE HCL (PF) 1 % IJ SOLN
INTRAMUSCULAR | Status: AC
  Filled 2014-03-24: qty 5

## 2014-03-24 MED ORDER — ONDANSETRON HCL 4 MG/2ML IJ SOLN
INTRAMUSCULAR | Status: AC
  Filled 2014-03-24: qty 2

## 2014-03-24 MED ORDER — PROPOFOL INFUSION 10 MG/ML OPTIME
INTRAVENOUS | Status: DC | PRN
  Administered 2014-03-24 (×2): via INTRAVENOUS
  Administered 2014-03-24: 150 ug/kg/min via INTRAVENOUS

## 2014-03-24 MED ORDER — MIDAZOLAM HCL 2 MG/2ML IJ SOLN
1.0000 mg | INTRAMUSCULAR | Status: DC | PRN
  Administered 2014-03-24: 2 mg via INTRAVENOUS

## 2014-03-24 MED ORDER — FENTANYL CITRATE 0.05 MG/ML IJ SOLN: 25.0000 ug | INTRAMUSCULAR | Status: DC | PRN

## 2014-03-24 MED ORDER — FENTANYL CITRATE 0.05 MG/ML IJ SOLN
25.0000 ug | INTRAMUSCULAR | Status: AC
  Administered 2014-03-24: 25 ug via INTRAVENOUS

## 2014-03-24 MED ORDER — LIDOCAINE VISCOUS 2 % MT SOLN
OROMUCOSAL | Status: AC
  Filled 2014-03-24: qty 15

## 2014-03-24 SURGICAL SUPPLY — 24 items
BLOCK BITE 60FR ADLT L/F BLUE (MISCELLANEOUS) ×3 IMPLANT
ELECT REM PT RETURN 9FT ADLT (ELECTROSURGICAL)
ELECTRODE REM PT RTRN 9FT ADLT (ELECTROSURGICAL) IMPLANT
FCP BXJMBJMB 240X2.8X (CUTTING FORCEPS)
FLOOR PAD 36X40 (MISCELLANEOUS) ×3
FORCEPS BIOP RAD 4 LRG CAP 4 (CUTTING FORCEPS) IMPLANT
FORCEPS BIOP RJ4 240 W/NDL (CUTTING FORCEPS)
FORCEPS BXJMBJMB 240X2.8X (CUTTING FORCEPS) IMPLANT
FORMALIN 10 PREFIL 20ML (MISCELLANEOUS) ×3 IMPLANT
INJECTOR/SNARE I SNARE (MISCELLANEOUS) IMPLANT
KIT CLEAN ENDO COMPLIANCE (KITS) ×3 IMPLANT
LUBRICANT JELLY 4.5OZ STERILE (MISCELLANEOUS) ×3 IMPLANT
MANIFOLD NEPTUNE II (INSTRUMENTS) ×3 IMPLANT
NEEDLE SCLEROTHERAPY 25GX240 (NEEDLE) IMPLANT
PAD FLOOR 36X40 (MISCELLANEOUS) ×1 IMPLANT
PROBE APC STR FIRE (PROBE) IMPLANT
PROBE INJECTION GOLD (MISCELLANEOUS)
PROBE INJECTION GOLD 7FR (MISCELLANEOUS) IMPLANT
SNARE SHORT THROW 13M SML OVAL (MISCELLANEOUS) ×3 IMPLANT
SYR 50ML LL SCALE MARK (SYRINGE) ×3 IMPLANT
SYR INFLATION 60ML (SYRINGE) IMPLANT
TRAP SPECIMEN MUCOUS 40CC (MISCELLANEOUS) ×3 IMPLANT
TUBING IRRIGATION ENDOGATOR (MISCELLANEOUS) ×3 IMPLANT
WATER STERILE IRR 1000ML POUR (IV SOLUTION) ×3 IMPLANT

## 2014-03-24 NOTE — Transfer of Care (Deleted)
Immediate Anesthesia Transfer of Care Note  Patient: Yolanda [name]  Procedure(s) Performed: Procedure(s) with comments: COLONOSCOPY WITH PROPOFOL (N/A) - in cecum at 0759; withdrawal time 15 minutes POLYPECTOMY (N/A) ESOPHAGOGASTRODUODENOSCOPY (EGD) WITH PROPOFOL (N/A) GASTRIC BIOPSIES (N/A)  Patient Location: PACU  Anesthesia Type:MAC  Level of Consciousness: awake, alert , oriented and patient cooperative  Airway & Oxygen Therapy: Patient Spontanous Breathing and Patient connected to nasal cannula oxygen  Post-op Assessment: Report given to PACU RN and Patient moving all extremities X 4  Post vital signs: Reviewed and stable  Complications: No apparent anesthesia complications

## 2014-03-24 NOTE — H&P (Addendum)
Primary Care Physician:  Purvis Kilts, MD Primary Gastroenterologist:  Dr. [name]  Pre-Procedure History & Physical: HPI:  Yolanda [name] is a 58 y.o. female here for BRBPR/ABDOMINAL PAIN. R clavicle fractured.   Past Medical History  Diagnosis Date  . Fibromyalgia   . Hypertension   . PE (pulmonary embolism)   . Depression   . Chronic pain in left foot   . Chronic abdominal pain   . IBS (irritable bowel syndrome)   . Gastritis   . Internal hemorrhoids   . Kidney stone   . Thyroid disease   . Sleep apnea     hasn't worn a CPAP in 6 months    Past Surgical History  Procedure Laterality Date  . Cesarean section      X2  . Breast lumpectomy    . Dilation and curettage of uterus    . Abdominal surgery      laparoscopy  . Cystoscopy w/ retrogrades  01/23/2012    Procedure: CYSTOSCOPY WITH RETROGRADE PYELOGRAM;  Surgeon: Marissa Nestle, MD;  Location: [facility];  Service: Urology;  Laterality: Left;  . Removal of stones  01/23/2012    Procedure: REMOVAL OF STONES;  Surgeon: Marissa Nestle, MD;  Location: [facility];  Service: Urology;  Laterality: N/A;  . Colonoscopy  2006    internal hemorrhoids  . Esophagogastroduodenoscopy   08/24/2006    Dr. Veto Kemps erythema of the antrum without erosion or ulcers/Otherwise, normal esophagus without evidence of Barrett's path with chronic gastritis    Prior to Admission medications   Medication Sig Start Date End Date Taking? Authorizing Provider  ALPRAZolam Duanne Moron) 1 MG tablet Take 1 mg by mouth 3 (three) times daily as needed. Anxiety   Yes Historical Provider, MD  CALCIUM PO Take 1 tablet by mouth daily.   Yes Historical Provider, MD  Cholecalciferol (VITAMIN D3) 2000 UNITS TABS Take 1 tablet by mouth daily.   Yes Historical Provider, MD  cycloSPORINE (RESTASIS) 0.05 % ophthalmic emulsion Place 1 drop into both eyes 2 (two) times daily.   Yes Historical Provider, MD  docusate sodium (COLACE) 100 MG capsule Take 100 mg  by mouth at bedtime.   Yes Historical Provider, MD  fish oil-omega-3 fatty acids 1000 MG capsule Take 1 g by mouth at bedtime.    Yes Historical Provider, MD  fluticasone (FLONASE) 50 MCG/ACT nasal spray Place 1-2 sprays into both nostrils daily.   Yes Historical Provider, MD  ibuprofen (ADVIL,MOTRIN) 200 MG tablet Take 200 mg by mouth as needed for mild pain.    Yes Historical Provider, MD  levothyroxine (SYNTHROID, LEVOTHROID) 75 MCG tablet Take 75 mcg by mouth daily before breakfast.   Yes Historical Provider, MD  MAGNESIUM PO Take 1 tablet by mouth at bedtime.    Yes Historical Provider, MD  meclizine (ANTIVERT) 25 MG tablet Take 25 mg by mouth 3 (three) times daily as needed. Dizziness   Yes Historical Provider, MD  methylphenidate (CONCERTA) 36 MG CR tablet Take 36 mg by mouth daily.   Yes Historical Provider, MD  Milnacipran HCl (SAVELLA) 100 MG TABS tablet Take 100 mg by mouth 2 (two) times daily.   Yes Historical Provider, MD  ondansetron (ZOFRAN ODT) 4 MG disintegrating tablet Take 1 tablet (4 mg total) by mouth every 8 (eight) hours as needed for nausea or vomiting. 01/29/14  Yes [name], MD  oxyCODONE-acetaminophen (PERCOCET/ROXICET) 5-325 MG per tablet Take 1 tablet by mouth every 4 (four) hours as needed.  03/19/14  Yes Carole Civil, MD  pantoprazole (PROTONIX) 40 MG tablet Take 1 tablet (40 mg total) by mouth daily. 09/15/12  Yes Nita Sells, MD  telmisartan (MICARDIS) 40 MG tablet Take 40 mg by mouth daily.   Yes Historical Provider, MD  traMADol (ULTRAM) 50 MG tablet Take 50-100 mg by mouth 3 (three) times daily.   Yes Historical Provider, MD    Allergies as of 03/11/2014 - Review Complete 03/11/2014  Allergen Reaction Noted  . Sulfonamide derivatives Other (See Comments)     Family History  Problem Relation Age of Onset  . Heart attack Mother     CABG  . Heart attack Father     CABG  . Heart attack Brother   . Stroke Mother   . Hypertension Mother   .  Hypertension Father   . Colon cancer Neg Hx     History   Social History  . Marital Status: Divorced    Spouse Name: N/A    Number of Children: N/A  . Years of Education: N/A   Occupational History  . [employer]    Social History Main Topics  . Smoking status: Former Research scientist (life sciences)  . Smokeless tobacco: Not on file  . Alcohol Use: Yes     Comment: ocassionally  . Drug Use: No  . Sexual Activity: No   Other Topics Concern  . Not on file   Social History Narrative  . No narrative on file    Review of Systems: See HPI, otherwise negative ROS   Physical Exam: BP 123/70  Pulse 77  Temp(Src) 98.5 F (36.9 C) (Oral)  Resp 16  Ht 5' 4"  (1.626 m)  Wt 242 lb (109.77 kg)  BMI 41.52 kg/m2  SpO2 95% General:   Alert,  pleasant and cooperative in NAD Head:  Normocephalic and atraumatic. Neck:  Supple; Lungs:  Clear throughout to auscultation.    Heart:  Regular rate and rhythm. Abdomen:  Soft, nontender and nondistended. Normal bowel sounds, without guarding, and without rebound.   Neurologic:  Alert and  oriented x4;  grossly normal neurologically.  Impression/Plan:     BRBPR/ABDOMINAL PAIN  PLAN: EGD/TCS TODAY

## 2014-03-24 NOTE — Discharge Instructions (Signed)
You had 2 polyps removed. Your RECTAL BLEEDING IS MOST LIKELY DUE TO internal hemorrhoids.  You have gastritis and duodenitis.  I biopsied your stomach. Your abdominal pain is most likely due to IBS/gastritis/duodenitis.   CONTINUE PROTONIX. TAKE 30 MINUTES PRIOR TO BREAKFAST.  AVOID ITEMS THAT TRIGGER GASTRITIS. SEE INFO BELOW.  FOLLOW A HIGH FIBER/LOW FAT DIET. AVOID ITEMS THAT CAUSE BLOATING. SEE INFO BELOW.  YOUR BIOPSY RESULTS WILL BE BACK IN 14 DAYS.  Next colonoscopy in 5-10 years.   ENDOSCOPY Care After Read the instructions outlined below and refer to this sheet in the next week. These discharge instructions provide you with general information on caring for yourself after you leave the hospital. While your treatment has been planned according to the most current medical practices available, unavoidable complications occasionally occur. If you have any problems or questions after discharge, call DR. Kejon Feild, [phone].  ACTIVITY  You may resume your regular activity, but move at a slower pace for the next 24 hours.   Take frequent rest periods for the next 24 hours.   Walking will help get rid of the air and reduce the bloated feeling in your belly (abdomen).   No driving for 24 hours (because of the medicine (anesthesia) used during the test).   You may shower.   Do not sign any important legal documents or operate any machinery for 24 hours (because of the anesthesia used during the test).    NUTRITION  Drink plenty of fluids.   You may resume your normal diet as instructed by your doctor.   Begin with a light meal and progress to your normal diet. Heavy or fried foods are harder to digest and may make you feel sick to your stomach (nauseated).   Avoid alcoholic beverages for 24 hours or as instructed.    MEDICATIONS  You may resume your normal medications.   WHAT YOU CAN EXPECT TODAY  Some feelings of bloating in the abdomen.   Passage of more gas  than usual.   Spotting of blood in your stool or on the toilet paper  .  IF YOU HAD POLYPS REMOVED DURING THE ENDOSCOPY:  Eat a soft diet IF YOU HAVE NAUSEA, BLOATING, ABDOMINAL PAIN, OR VOMITING.    FINDING OUT THE RESULTS OF YOUR TEST Not all test results are available during your visit. DR. [name] WILL CALL YOU WITHIN 14 DAYS OF YOUR PROCEDUE WITH YOUR RESULTS. Do not assume everything is normal if you have not heard from DR. Iran Kievit IN TWO WEEKS, CALL HER OFFICE AT [phone].  SEEK IMMEDIATE MEDICAL ATTENTION AND CALL THE OFFICE: [phone] IF:  You have more than a spotting of blood in your stool.   Your belly is swollen (abdominal distention).   You are nauseated or vomiting.   You have a temperature over 101F.   You have abdominal pain or discomfort that is severe or gets worse throughout the day.   Gastritis  Gastritis is an inflammation (the body's way of reacting to injury and/or infection) of the stomach. It is often caused by viral or bacterial (germ) infections. It can also be caused BY ALCOHOL, ASPIRIN, BC/GOODY POWDER'S, (IBUPROFEN) MOTRIN, OR ALEVE (NAPROXEN), chemicals (including alcohol), SPICY FOODS, and medications. This illness may be associated with generalized malaise (feeling tired, not well), UPPER ABDOMINAL STOMACH cramps, and fever. One common bacterial cause of gastritis is an organism known as H. Pylori. This can be treated with antibiotics.    High-Fiber Diet A high-fiber diet changes  your normal diet to include more whole grains, legumes, fruits, and vegetables. Changes in the diet involve replacing refined carbohydrates with unrefined foods. The calorie level of the diet is essentially unchanged. The Dietary Reference Intake (recommended amount) for adult males is 38 grams per day. For adult females, it is 25 grams per day. Pregnant and lactating women should consume 28 grams of fiber per day. Fiber is the intact part of a plant that is not broken  down during digestion. Functional fiber is fiber that has been isolated from the plant to provide a beneficial effect in the body. PURPOSE  Increase stool bulk.   Ease and regulate bowel movements.   Lower cholesterol.  INDICATIONS THAT YOU NEED MORE FIBER  Constipation and hemorrhoids.   Uncomplicated diverticulosis (intestine condition) and irritable bowel syndrome.   Weight management.   As a protective measure against hardening of the arteries (atherosclerosis), diabetes, and cancer.   GUIDELINES FOR INCREASING FIBER IN THE DIET  Start adding fiber to the diet slowly. A gradual increase of about 5 more grams (2 slices of whole-wheat bread, 2 servings of most fruits or vegetables, or 1 bowl of high-fiber cereal) per day is best. Too rapid an increase in fiber may result in constipation, flatulence, and bloating.   Drink enough water and fluids to keep your urine clear or pale yellow. Water, juice, or caffeine-free drinks are recommended. Not drinking enough fluid may cause constipation.   Eat a variety of high-fiber foods rather than one type of fiber.   Try to increase your intake of fiber through using high-fiber foods rather than fiber pills or supplements that contain small amounts of fiber.   The goal is to change the types of food eaten. Do not supplement your present diet with high-fiber foods, but replace foods in your present diet.  INCLUDE A VARIETY OF FIBER SOURCES  Replace refined and processed grains with whole grains, canned fruits with fresh fruits, and incorporate other fiber sources. White rice, white breads, and most bakery goods contain little or no fiber.   Brown whole-grain rice, buckwheat oats, and many fruits and vegetables are all good sources of fiber. These include: broccoli, Brussels sprouts, cabbage, cauliflower, beets, sweet potatoes, white potatoes (skin on), carrots, tomatoes, eggplant, squash, berries, fresh fruits, and dried fruits.   Cereals  appear to be the richest source of fiber. Cereal fiber is found in whole grains and bran. Bran is the fiber-rich outer coat of cereal grain, which is largely removed in refining. In whole-grain cereals, the bran remains. In breakfast cereals, the largest amount of fiber is found in those with "bran" in their names. The fiber content is sometimes indicated on the label.   You may need to include additional fruits and vegetables each day.   In baking, for 1 cup white flour, you may use the following substitutions:   1 cup whole-wheat flour minus 2 tablespoons.   1/2 cup white flour plus 1/2 cup whole-wheat flour.   Low-Fat Diet BREADS, CEREALS, PASTA, RICE, DRIED PEAS, AND BEANS These products are high in carbohydrates and most are low in fat. Therefore, they can be increased in the diet as substitutes for fatty foods. They too, however, contain calories and should not be eaten in excess. Cereals can be eaten for snacks as well as for breakfast.  Include foods that contain fiber (fruits, vegetables, whole grains, and legumes). Research shows that fiber may lower blood cholesterol levels, especially the water-soluble fiber found in fruits, vegetables,  oat products, and legumes. FRUITS AND VEGETABLES It is good to eat fruits and vegetables. Besides being sources of fiber, both are rich in vitamins and some minerals. They help you get the daily allowances of these nutrients. Fruits and vegetables can be used for snacks and desserts. MEATS Limit lean meat, chicken, [place], and fish to no more than 6 ounces per day. Beef, Pork, and Lamb Use lean cuts of beef, pork, and lamb. Lean cuts include:  Extra-lean ground beef.  Arm roast.  Sirloin tip.  Center-cut ham.  Round steak.  Loin chops.  Rump roast.  Tenderloin.  Trim all fat off the outside of meats before cooking. It is not necessary to severely decrease the intake of red meat, but lean choices should be made. Lean meat is rich in protein and  contains a highly absorbable form of iron. Premenopausal women, in particular, should avoid reducing lean red meat because this could increase the risk for low red blood cells (iron-deficiency anemia). The organ meats, such as liver, sweetbreads, kidneys, and brain are very rich in cholesterol. They should be limited. Chicken and [place] These are good sources of protein. The fat of poultry can be reduced by removing the skin and underlying fat layers before cooking. Chicken and [place] can be substituted for lean red meat in the diet. Poultry should not be fried or covered with high-fat sauces. Fish and Shellfish Fish is a good source of protein. Shellfish contain cholesterol, but they usually are low in saturated fatty acids. The preparation of fish is important. Like chicken and [place], they should not be fried or covered with high-fat sauces. EGGS Egg whites contain no fat or cholesterol. They can be eaten often. Try 1 to 2 egg whites instead of whole eggs in recipes or use egg substitutes that do not contain yolk. MILK AND DAIRY PRODUCTS Use skim or 1% milk instead of 2% or whole milk. Decrease whole milk, natural, and processed cheeses. Use nonfat or low-fat (2%) cottage cheese or low-fat cheeses made from vegetable oils. Choose nonfat or low-fat (1 to 2%) yogurt. Experiment with evaporated skim milk in recipes that call for heavy cream. Substitute low-fat yogurt or low-fat cottage cheese for sour cream in dips and salad dressings. Have at least 2 servings of low-fat dairy products, such as 2 glasses of skim (or 1%) milk each day to help get your daily calcium intake.  FATS AND OILS Reduce the total intake of fats, especially saturated fat. Butterfat, lard, and beef fats are high in saturated fat and cholesterol. These should be avoided as much as possible. Vegetable fats do not contain cholesterol, but certain vegetable fats, such as coconut oil, palm oil, and palm kernel oil are very high in  saturated fats. These should be limited. These fats are often used in bakery goods, processed foods, popcorn, oils, and nondairy creamers. Vegetable shortenings and some peanut butters contain hydrogenated oils, which are also saturated fats. Read the labels on these foods and check for saturated vegetable oils. Unsaturated vegetable oils and fats do not raise blood cholesterol. However, they should be limited because they are fats and are high in calories. Total fat should still be limited to 30% of your daily caloric intake. Desirable liquid vegetable oils are corn oil, cottonseed oil, olive oil, canola oil, safflower oil, soybean oil, and sunflower oil. Peanut oil is not as good, but small amounts are acceptable. Buy a heart-healthy tub margarine that has no partially hydrogenated oils in the ingredients. Mayonnaise  and salad dressings often are made from unsaturated fats, but they should also be limited because of their high calorie and fat content. Seeds, nuts, peanut butter, olives, and avocados are high in fat, but the fat is mainly the unsaturated type. These foods should be limited mainly to avoid excess calories and fat. OTHER EATING TIPS Snacks  Most sweets should be limited as snacks. They tend to be rich in calories and fats, and their caloric content outweighs their nutritional value. Some good choices in snacks are graham crackers, melba toast, soda crackers, bagels (no egg), English muffins, fruits, and vegetables. These snacks are preferable to snack crackers, [place] fries, and chips. Popcorn should be air-popped or cooked in small amounts of liquid vegetable oil. Desserts Eat fruit, low-fat yogurt, and fruit ices. AVOID pastries, cake, and cookies. Sherbet, angel food cake, gelatin dessert, frozen low-fat yogurt, or other frozen products that do not contain saturated fat (pure fruit juice bars, frozen ice pops) are also acceptable.  COOKING METHODS Choose those methods that use little or  no fat. They include: Poaching.  Braising.  Steaming.  Grilling.  Baking.  Stir-frying.  Broiling.  Microwaving.  Foods can be cooked in a nonstick pan without added fat, or use a nonfat cooking spray in regular cookware. Limit fried foods and avoid frying in saturated fat. Add moisture to lean meats by using water, broth, cooking wines, and other nonfat or low-fat sauces along with the cooking methods mentioned above. Soups and stews should be chilled after cooking. The fat that forms on top after a few hours in the refrigerator should be skimmed off. When preparing meals, avoid using excess salt. Salt can contribute to raising blood pressure in some people. EATING AWAY FROM HOME Order entres, potatoes, and vegetables without sauces or butter. When meat exceeds the size of a deck of cards (3 to 4 ounces), the rest can be taken home for another meal. Choose vegetable or fruit salads and ask for low-calorie salad dressings to be served on the side. Use dressings sparingly. Limit high-fat toppings, such as bacon, crumbled eggs, cheese, sunflower seeds, and olives. Ask for heart-healthy tub margarine instead of butter.  Polyps, Colon  A polyp is extra tissue that grows inside your body. Colon polyps grow in the large intestine. The large intestine, also called the colon, is part of your digestive system. It is a long, hollow tube at the end of your digestive tract where your body makes and stores stool. Most polyps are not dangerous. They are benign. This means they are not cancerous. But over time, some types of polyps can turn into cancer. Polyps that are smaller than a pea are usually not harmful. But larger polyps could someday become or may already be cancerous. To be safe, doctors remove all polyps and test them.   WHO GETS POLYPS? Anyone can get polyps, but certain people are more likely than others. You may have a greater chance of getting polyps if:  You are over 50.   You have had  polyps before.   Someone in your family has had polyps.   Someone in your family has had cancer of the large intestine.   Find out if someone in your family has had polyps. You may also be more likely to get polyps if you:   Eat a lot of fatty foods   Smoke   Drink alcohol   Do not exercise  Eat too much   TREATMENT  The caregiver will remove  the polyp during sigmoidoscopy or colonoscopy.  PREVENTION There is not one sure way to prevent polyps. You might be able to lower your risk of getting them if you:  Eat more fruits and vegetables and less fatty food.   Do not smoke.   Avoid alcohol.   Exercise every day.   Lose weight if you are overweight.   Eating more calcium and folate can also lower your risk of getting polyps. Some foods that are rich in calcium are milk, cheese, and broccoli. Some foods that are rich in folate are chickpeas, kidney beans, and spinach.    Hemorrhoids Hemorrhoids are dilated (enlarged) veins around the rectum. Sometimes clots will form in the veins. This makes them swollen and painful. These are called thrombosed hemorrhoids. Causes of hemorrhoids include:  Constipation.   Straining to have a bowel movement.   HEAVY LIFTING HOME CARE INSTRUCTIONS  Eat a well balanced diet and drink 6 to 8 glasses of water every day to avoid constipation. You may also use a bulk laxative.   Avoid straining to have bowel movements.   Keep anal area dry and clean.   Do not use a donut shaped pillow or sit on the toilet for long periods. This increases blood pooling and pain.   Move your bowels when your body has the urge; this will require less straining and will decrease pain and pressure.

## 2014-03-24 NOTE — Anesthesia Procedure Notes (Signed)
Procedure Name: MAC Date/Time: 03/24/2014 7:40 AM Performed by: Michele Rockers Pre-anesthesia Checklist: Patient identified, Timeout performed, Emergency Drugs available, Suction available and Patient being monitored Patient Re-evaluated:Patient Re-evaluated prior to inductionOxygen Delivery Method: Simple face mask Preoxygenation: Pre-oxygenation with 100% oxygen

## 2014-03-24 NOTE — Op Note (Signed)
[facility] [address] [place], [address]   COLONOSCOPY PROCEDURE REPORT  PATIENT: Yolanda [name], Yolanda [name]  MR#: [id number] BIRTHDATE: 1956-01-10 , 54  yrs. old GENDER: Female ENDOSCOPIST: Barney Drain, MD REFERRED IR:WERX Hilma Favors, M.D. PROCEDURE DATE:  03/24/2014 PROCEDURE:   Colonoscopy with snare polypectomy INDICATIONS:Rectal Bleeding. MEDICATIONS: MAC sedation, administered by CRNA DESCRIPTION OF PROCEDURE:    Physical exam was performed.  Informed consent was obtained from the patient after explaining the benefits, risks, and alternatives to procedure.  The patient was connected to monitor and placed in left lateral position. Continuous oxygen was provided by nasal cannula and IV medicine administered through an indwelling cannula.  After administration of sedation and rectal exam, the patients rectum was intubated and the     colonoscope was advanced under direct visualization to the ileum.  The scope was removed slowly by carefully examining the color, texture, anatomy, and integrity mucosa on the way out.  The patient was recovered in endoscopy and discharged home in satisfactory condition.    COLON FINDINGS: The mucosa appeared normal in the terminal ileum and Two sessile polyps measuring 6-7 mm in size were found in the descending colon.  A polypectomy was performed using snare cautery. OTHERWISE NORMAL COLON. MODERATE INTERNAL HEMORRHOIDS  PREP QUALITY: good.  CECAL W/D TIME: 15 minutes     COMPLICATIONS: None  ENDOSCOPIC IMPRESSION: 1.  Normal mucosa in the terminal ileum 2.  Two COLON POLYPS REMOVED 3.  RECTAL BLEEDING DUE TO INTERNAL HEMORRHOIDS   RECOMMENDATIONS: [place].  TAKE 30 MINUTES PRIOR TO BREAKFAST. AVOID ITEMS THAT TRIGGER GASTRITIS. FOLLOW A HIGH FIBER/LOW FAT DIET.  AVOID ITEMS THAT CAUSE BLOATING.  BIOPSY RESULTS WILL BE BACK IN 14 DAYS.  Next colonoscopy in 5-10  years.       _______________________________ Lorrin MaisBarney Drain, MD 03/24/2014 9:36 AM

## 2014-03-24 NOTE — Transfer of Care (Addendum)
Immediate Anesthesia Transfer of Care Note  Patient: Yolanda [name]  Procedure(s) Performed: Procedure(s) with comments: COLONOSCOPY WITH PROPOFOL (N/A) - in cecum at 0759; withdrawal time 15 minutes POLYPECTOMY (N/A) ESOPHAGOGASTRODUODENOSCOPY (EGD) WITH PROPOFOL (N/A) GASTRIC BIOPSIES (N/A)  Patient Location: PACU  Anesthesia Type:MAC  Level of Consciousness: awake, alert , oriented, patient cooperative and responds to stimulation  Airway & Oxygen Therapy: Patient Spontanous Breathing and Patient connected to nasal cannula oxygen  Post-op Assessment: Report given to PACU RN, Post -op Vital signs reviewed and stable and Patient moving all extremities  Post vital signs: Reviewed and stable  Complications: No apparent anesthesia complications

## 2014-03-24 NOTE — Anesthesia Preprocedure Evaluation (Signed)
Anesthesia Evaluation  Patient identified by MRN, date of birth, ID band Patient awake    Reviewed: Allergy & Precautions, H&P , NPO status , Patient's Chart, lab work & pertinent test results  History of Anesthesia Complications (+) PONV, POST - OP SPINAL HEADACHE and history of anesthetic complications  Airway Mallampati: II TM Distance: >3 FB Neck ROM: Full    Dental  (+) Teeth Intact   Pulmonary sleep apnea , former smoker, PE breath sounds clear to auscultation        Cardiovascular hypertension, Pt. on medications Rhythm:Regular Rate:Normal     Neuro/Psych PSYCHIATRIC DISORDERS Depression    GI/Hepatic GERD-  Medicated and Controlled,  Endo/Other  Morbid obesity  Renal/GU      Musculoskeletal  (+) Fibromyalgia -  Abdominal   Peds  Hematology   Anesthesia Other Findings   Reproductive/Obstetrics                           Anesthesia Physical Anesthesia Plan  ASA: III  Anesthesia Plan: MAC   Post-op Pain Management:    Induction: Intravenous  Airway Management Planned: Simple Face Mask  Additional Equipment:   Intra-op Plan:   Post-operative Plan:   Informed Consent: I have reviewed the patients History and Physical, chart, labs and discussed the procedure including the risks, benefits and alternatives for the proposed anesthesia with the patient or authorized representative who has indicated his/her understanding and acceptance.     Plan Discussed with:   Anesthesia Plan Comments:         Anesthesia Quick Evaluation

## 2014-03-24 NOTE — Op Note (Signed)
[facility] [address] [place], [address]   ENDOSCOPY PROCEDURE REPORT  PATIENT: Yolanda [name], Yolanda [name]  MR#: [id number] BIRTHDATE: [birth year] , 49  yrs. old GENDER: Female  ENDOSCOPIST: Barney Drain, MD REFERRED KH:VFMB Hilma Favors, M.D.  PROCEDURE DATE: 03/24/2014 PROCEDURE:   EGD w/ biopsy  INDICATIONS:Dyspepsia. MEDICATIONS: MAC sedation, administered by CRNA TOPICAL ANESTHETIC:   none  DESCRIPTION OF PROCEDURE:     Physical exam was performed.  Informed consent was obtained from the patient after explaining the benefits, risks, and alternatives to the procedure.  The patient was connected to the monitor and placed in the left lateral position.  Continuous oxygen was provided by nasal cannula and IV medicine administered through an indwelling cannula.  After administration of sedation, the patients esophagus was intubated and the     endoscope was advanced under direct visualization to the second portion of the duodenum.  The scope was removed slowly by carefully examining the color, texture, anatomy, and integrity of the mucosa on the way out.  The patient was recovered in endoscopy and discharged home in satisfactory condition.   ESOPHAGUS: The mucosa of the esophagus appeared normal.   STOMACH: Non-erosive gastritis (inflammation) was found in the gastric antrum and on the greater curvature of the gastric body.  Multiple biopsies were performed using cold forceps.   DUODENUM: Mild duodenal inflammation was found in the duodenal bulb.   The duodenal mucosa showed no abnormalities in the 2nd part of the duodenum. COMPLICATIONS:   None  ENDOSCOPIC IMPRESSION: 1.   DYSPEPSIA DUE TO IBS/GASTRITIS/DUODENITIS 2.  MILD Non-erosive gastritis & DUODENITIS  RECOMMENDATIONS: CONTINUE [place].  TAKE 30 MINUTES PRIOR TO BREAKFAST. AVOID ITEMS THAT TRIGGER GASTRITIS. FOLLOW A HIGH FIBER/LOW FAT DIET.  AVOID ITEMS THAT CAUSE BLOATING.  BIOPSY RESULTS WILL BE  BACK IN 14 DAYS.  Next colonoscopy in 5-10 years.   REPEAT EXAM:   _______________________________ Lorrin MaisBarney Drain, MD 03/24/2014 9:32 AM

## 2014-03-24 NOTE — Progress Notes (Signed)
REVIEWED-NO ADDITIONAL RECOMMENDATIONS. 

## 2014-03-24 NOTE — Transfer of Care (Deleted)
Immediate Anesthesia Transfer of Care Note  Patient: Yolanda [name]  Procedure(s) Performed: Procedure(s) with comments: COLONOSCOPY WITH PROPOFOL (N/A) - in cecum at 0759; withdrawal time 15 minutes POLYPECTOMY (N/A) ESOPHAGOGASTRODUODENOSCOPY (EGD) WITH PROPOFOL (N/A) GASTRIC BIOPSIES (N/A)  Patient Location: PACU  Anesthesia Type:MAC  Level of Consciousness: awake, alert , oriented, patient cooperative and responds to stimulation  Airway & Oxygen Therapy: Patient Spontanous Breathing and Patient connected to nasal cannula oxygen  Post-op Assessment: Report given to PACU RN, Post -op Vital signs reviewed and stable and Patient moving all extremities  Post vital signs: Reviewed and stable  Complications: No apparent anesthesia complications

## 2014-03-24 NOTE — Anesthesia Postprocedure Evaluation (Signed)
  Anesthesia Post-op Note  Patient: Yolanda [name]  Procedure(s) Performed: Procedure(s) with comments: COLONOSCOPY WITH PROPOFOL (N/A) - in cecum at 0759; withdrawal time 15 minutes POLYPECTOMY (N/A) ESOPHAGOGASTRODUODENOSCOPY (EGD) WITH PROPOFOL (N/A) GASTRIC BIOPSIES (N/A)  Patient Location: PACU  Anesthesia Type:MAC  Level of Consciousness: awake, alert , oriented and patient cooperative  Airway and Oxygen Therapy: Patient Spontanous Breathing and Patient connected to nasal cannula oxygen  Post-op Pain: none  Post-op Assessment: Post-op Vital signs reviewed, Patient's Cardiovascular Status Stable, Respiratory Function Stable and Patent Airway  Post-op Vital Signs: Reviewed and stable  Last Vitals:  Filed Vitals:   03/24/14 0845  BP: 129/75  Pulse: 81  Temp:   Resp: 12    Complications: No apparent anesthesia complications

## 2014-03-24 NOTE — Transfer of Care (Deleted)
Immediate Anesthesia Transfer of Care Note  Patient: Yolanda [name]  Procedure(s) Performed: Procedure(s) with comments: COLONOSCOPY WITH PROPOFOL (N/A) - in cecum at 0759; withdrawal time 15 minutes POLYPECTOMY (N/A) ESOPHAGOGASTRODUODENOSCOPY (EGD) WITH PROPOFOL (N/A) GASTRIC BIOPSIES (N/A)  Patient Location:  Anesthesia Type  Level of Consciousness:   Airway & Oxygen Therapy:     Post vital sign  Complicatio

## 2014-03-24 NOTE — Anesthesia Preprocedure Evaluation (Deleted)
Anesthesia Evaluation    Airway       Dental   Pulmonary former smoker,          Cardiovascular hypertension,     Neuro/Psych    GI/Hepatic   Endo/Other    Renal/GU      Musculoskeletal   Abdominal   Peds  Hematology   Anesthesia Other Findings   Reproductive/Obstetrics                          Anesthesia Physical Anesthesia Plan Anesthesia Quick Evaluation

## 2014-03-25 ENCOUNTER — Encounter (HOSPITAL_COMMUNITY): Payer: Self-pay | Admitting: Gastroenterology

## 2014-03-26 ENCOUNTER — Ambulatory Visit (HOSPITAL_COMMUNITY)
Admission: RE | Admit: 2014-03-26 | Discharge: 2014-03-26 | Disposition: A | Discharge status: Home / Self Care | Payer: PRIVATE HEALTH INSURANCE | Source: Ambulatory Visit | Attending: Orthopedic Surgery | Admitting: Orthopedic Surgery

## 2014-03-26 DIAGNOSIS — M25619 Stiffness of unspecified shoulder, not elsewhere classified: Secondary | ICD-10-CM | POA: Insufficient documentation

## 2014-03-26 DIAGNOSIS — M25519 Pain in unspecified shoulder: Secondary | ICD-10-CM | POA: Diagnosis not present

## 2014-03-26 DIAGNOSIS — IMO0001 Reserved for inherently not codable concepts without codable children: Secondary | ICD-10-CM | POA: Diagnosis not present

## 2014-03-26 DIAGNOSIS — M6281 Muscle weakness (generalized): Secondary | ICD-10-CM | POA: Insufficient documentation

## 2014-03-26 DIAGNOSIS — M25611 Stiffness of right shoulder, not elsewhere classified: Secondary | ICD-10-CM | POA: Insufficient documentation

## 2014-03-26 DIAGNOSIS — M25511 Pain in right shoulder: Secondary | ICD-10-CM

## 2014-03-26 NOTE — Evaluation (Addendum)
Occupational Therapy Evaluation  Patient Details  Name: Yolanda [name] MRN: [id number] Date of Birth: 03-29-1956  Today's Date: 03/26/2014 Time: 0109-3235 OT Time Calculation (min): 32 min OT eval: 5732-2025 49'  Visit#: 1 of 24  Re-eval: 04/23/14  Assessment Diagnosis: Right clavical fracture Next MD Visit: 04/16/2014 Prior Therapy: none  Past Medical History:  Past Medical History  Diagnosis Date  . Fibromyalgia   . Hypertension   . PE (pulmonary embolism)   . Depression   . Chronic pain in left foot   . Chronic abdominal pain   . IBS (irritable bowel syndrome)   . Gastritis   . Internal hemorrhoids   . Kidney stone   . Thyroid disease   . Sleep apnea     hasn't worn a CPAP in 6 months   Past Surgical History:  Past Surgical History  Procedure Laterality Date  . Cesarean section      X2  . Breast lumpectomy    . Dilation and curettage of uterus    . Abdominal surgery      laparoscopy  . Cystoscopy w/ retrogrades  01/23/2012    Procedure: CYSTOSCOPY WITH RETROGRADE PYELOGRAM;  Surgeon: Marissa Nestle, MD;  Location: [facility];  Service: Urology;  Laterality: Left;  . Removal of stones  01/23/2012    Procedure: REMOVAL OF STONES;  Surgeon: Marissa Nestle, MD;  Location: [facility];  Service: Urology;  Laterality: N/A;  . Colonoscopy  2006    internal hemorrhoids  . Esophagogastroduodenoscopy   08/24/2006    Dr. Veto Kemps erythema of the antrum without erosion or ulcers/Otherwise, normal esophagus without evidence of Barrett's path with chronic gastritis  . Colonoscopy with propofol N/A 03/24/2014    Procedure: COLONOSCOPY WITH PROPOFOL;  Surgeon: Danie Binder, MD;  Location: [facility];  Service: Endoscopy;  Laterality: N/A;  in cecum at 0759; withdrawal time 15 minutes  . Polypectomy N/A 03/24/2014    Procedure: POLYPECTOMY;  Surgeon: Danie Binder, MD;  Location: [facility];  Service: Endoscopy;  Laterality: N/A;  . Esophagogastroduodenoscopy (egd) with propofol  N/A 03/24/2014    Procedure: ESOPHAGOGASTRODUODENOSCOPY (EGD) WITH PROPOFOL;  Surgeon: Danie Binder, MD;  Location: [facility];  Service: Endoscopy;  Laterality: N/A;  . 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  Pertinent History: Pt is a 58 y/o female s/p right clavical fracture due to falling out of shower chair on (02/23/2014).  Pt reports difficulty with daily tasks including showering, dressing, household tasks, meal preparation.  Patient reports she did begin driving this week.  Patient has been able to do daily tasks independently for the past week, although it is very difficult, work tasks including keyboarding and holding a phone, are difficult and painful for patient. Patient reports increased pain at night, making sleep difficult.  Dr. Aline Brochure referred patient to occupational therapy for evaluation and treatment.  Special Tests: FOTO score: 41/100 (59% impairment) Patient Stated Goals: To be able to use the arm normally and return to work without pain during work tasks.  Pain Assessment Currently in Pain?: Yes Pain Score: 3  Pain Location: Shoulder Pain Orientation: Right Pain Type: Acute pain Pain Relieving Factors: Pain medications and  ice  Precautions/Restrictions  Precautions Precautions: Shoulder Type of Shoulder Precautions: PROM, AAROM until follow-up appt (04/16/2014) Shoulder Interventions: Shoulder sling/immobilizer;At all times;Off for dressing/bathing/exercises  Balance Screening Balance Screen Has the patient fallen in the past 6 months: Yes How many times?: 1 (when patient injured arm) Has the patient had a decrease in activity level because of a fear of falling? : No Is the  patient reluctant to leave their home because of a fear of falling? : No  Prior [place] expects to be discharged to:: Private residence Living Arrangements: Other relatives (great niece) Available Help at Discharge: Family (Great-niece lives with patient and helps with patient needs) Prior Function Level of Independence: Independent with basic ADLs  Able to Take Stairs?: Yes Driving: Yes Vocation: Full time employment Vocation Requirements: Keyboarding, answering phones, filing, reaching in cabinets  Assessment ADL/Vision/Perception ADL ADL Comments: Patient reports difficulty with dressing tasks including donning shirts and pants, showering tasks including reaching up and under arms, household tasks including carrying items (food/plates), lifting items, and reaching into high cabinets.  Patient reports difficulty with work tasks including typing/keyboarding, holding phone, and filing.  Dominant Hand: Right Vision - History Baseline Vision: Wears glasses only for reading (wears glasses for reading and distance)  Cognition/Observation Cognition Overall Cognitive Status: Within Functional Limits for tasks assessed Arousal/Alertness: Awake/alert Orientation Level: Oriented X4 Observation/Other Assessments Observations: Pt reports she has had vertigo for 15 years.   Sensation/Coordination/Edema Edema Edema: Patient has increased edema along right upper arm from proximal elbow to shoulder.   Additional Assessments RUE Assessment RUE Assessment:  (Assessed supine, ER/IR adducted) RUE PROM (degrees) Right Shoulder Flexion: 130 Degrees Right Shoulder ABduction: 86 Degrees Right Shoulder Internal Rotation: 87 Degrees Right Shoulder External Rotation: 40 Degrees RUE Strength RUE Overall Strength Comments: Not assessed due to shoulder precautions Palpation Palpation: Max fascial restrictions in the right upper arm, trapezius, and scapularis regions.            Occupational Therapy Assessment and Plan OT Assessment and Plan Clinical Impression Statement: A: Patient is a 58 y/o female diagnosed with right clavical fracture, presenting with pain in right shoulder, increased fascial restrictions, increased edema in right upper arm region, decreased strength, and decreased joint mobility resulting in difficulty completing daily activities and work tasks. Pt will benefit from skilled therapeutic intervention in order to improve on the following deficits: Increased fascial restricitons;Impaired UE functional use;Pain;Decreased strength;Decreased range of motion;Increased edema Rehab Potential: Excellent OT Frequency: Min 3X/week OT Duration: 8 weeks OT Treatment/Interventions: Self-care/ADL training;Modalities;Therapeutic exercise;Therapeutic activities;Patient/family education;Manual therapy OT Plan: P: Pt will benefit from skilled OT interventions in order to decrease pain, increase ROM, increase strength, and increase overall RUE functional use. Treatment Plan: PROM, AAROM, and AROM, MFR and manual stretching, scapular strengthening and proximal stabilization, RUE general strengthening   Goals Short Term Goals Time to Complete Short Term Goals: 4 weeks Short Term Goal 1: Patient will be educated on HEP.  Short Term Goal 2: Patient will decrease fascial restrictions from max-mod amount.  Short Term Goal 3: Patient will increase PROM to [facility] to increase ability to donn upper body clothing.   Short Term Goal 4: Patient will increase strength to 3/5 to increase ability to perform keyboarding tasks.   Short Term Goal 5: Patient will decrease pain to 4/10 during daily tasks.  Long Term Goals Time to Complete Long Term Goals: 8 weeks Long Term Goal 1: Patient will return to highest level of independence during all daily  and work tasks. Long Term Goal 2: Patient will decrease fascial restrictions from mod-min amount or less.  Long Term Goal 3:  Patient will increase AROM to WNL to increase ability to reach into overhead cabinets.  Long Term Goal 4: Patient will increase strength to 5/5 to increase ability to lift and carry items during household chores.  Long Term Goal 5: Patient will decrease pain to 2/10 or less during daily and work tasks.   Problem List Patient Active Problem List   Diagnosis Date Noted  . Muscle weakness (generalized) 03/26/2014  . Pain in joint, shoulder region 03/26/2014  . Decreased range of motion of right shoulder 03/26/2014  . Right clavicle fracture 03/19/2014  . Fatty liver 03/17/2014  . Rectal bleeding 03/17/2014  . Dyspepsia 03/11/2014  . Precordial pain 09/11/2012  . Obesity, morbid 09/11/2012  . Hypothyroidism 09/11/2012  . ADHD (attention deficit hyperactivity disorder) 09/11/2012  . Anxiety 09/11/2012  . GERD 12/21/2009  . LIVER FUNCTION TESTS, ABNORMAL, HX OF 04/29/2009  . IRRITABLE BOWEL SYNDROME 04/22/2009  . DYSPHAGIA UNSPECIFIED 04/22/2009  . ABDOMINAL PAIN RIGHT UPPER QUADRANT 04/22/2009  . ABDOMINAL PAIN RIGHT LOWER QUADRANT 04/22/2009  . LIPOMA OF OTHER SKIN AND SUBCUTANEOUS TISSUE 07/09/2008  . HYPERLIPIDEMIA 07/09/2008  . DEPRESSION 07/09/2008  . HYPERTENSION 07/09/2008  . PE 07/09/2008  . BRONCHITIS 07/09/2008  . GASTRITIS, CHRONIC 07/09/2008  . CALCULUS OF KIDNEY 07/09/2008  . SLEEP APNEA 07/09/2008  . ABDOMINAL PAIN, CHRONIC 07/09/2008    End of Session Activity Tolerance: Patient tolerated treatment well General Behavior During Therapy: [facility] for tasks assessed/performed OT Plan of Care OT Home Exercise Plan: towel slides  OT Patient Instructions: handout (scanned)  Consulted and Agree with Plan of Care: Patient     Guadelupe Sabin. OT Student  03/26/2014, 4:52 PM  Physician Documentation Your signature is required to indicate approval of the treatment plan as stated above.  Please sign and either send electronically or make a copy of this report for your  files and return this physician signed original.  Please mark one 1.__approve of plan  2. ___approve of plan with the following conditions.   ______________________________                                                          _____________________ Physician Signature                                                                                                             Date

## 2014-03-26 NOTE — Evaluation (Signed)
Note reviewed by clinical instructor and accurately reflects treatment session.  Ailene Ravel, OTR/L,CBIS

## 2014-03-30 ENCOUNTER — Ambulatory Visit (HOSPITAL_COMMUNITY)
Admission: RE | Admit: 2014-03-30 | Discharge: 2014-03-30 | Disposition: A | Discharge status: Home / Self Care | Payer: PRIVATE HEALTH INSURANCE | Source: Ambulatory Visit | Attending: Orthopedic Surgery | Admitting: Orthopedic Surgery

## 2014-03-30 DIAGNOSIS — IMO0001 Reserved for inherently not codable concepts without codable children: Secondary | ICD-10-CM | POA: Diagnosis not present

## 2014-03-30 NOTE — Progress Notes (Signed)
Occupational Therapy Treatment Patient Details  Name: Yolanda [name] MRN: [id number] Date of Birth: 07/14/1955  Today's Date: 03/30/2014 Time: 6301-6010 OT Time Calculation (min): 43 min Manual 9323-5573 (33') Theraeptuic Exercises 2202-5427 (10')  Visit#: 2 of 24  Re-eval: 04/23/14    Authorization:    Authorization Time Period:    Authorization Visit#:   of    Subjective Symptoms/Limitations Symptoms: "Its been bad that last few nights. And I haven't taken a pain pill today. I wanted to be able to feel what you guys are doing." Pain Assessment Currently in Pain?: Yes Pain Score: 6  Pain Location: Shoulder Pain Orientation: Right Pain Type: Acute pain  Exercise/Treatments Supine Protraction: PROM;5 reps Horizontal ABduction: PROM;5 reps External Rotation: PROM;5 reps Internal Rotation: PROM;5 reps Flexion: PROM;5 reps ABduction: PROM;5 reps Other Supine Exercises: Elbow flexion/extension, supination/pronation, wrist flexion/extension PROM 5 reps Seated Elevation: AROM;10 reps Extension: AROM;10 reps Row: AROM;10 reps    Manual Therapy Manual Therapy: Myofascial release Myofascial Release: Myofascial releae (MFR) and manual stretching to RUE bicep, tricep, upper arm, anterior shoulder, posterior shoulder, upper trap, and scapular regions to decrease pain and tenderness and promote improved range of motion  Occupational Therapy Assessment and Plan OT Assessment and Plan Clinical Impression Statement: Initiated MFR and PROM this session.  Pt had max fascial restrictions and max tenderness at start of session.  Decesed tenderness in [place] to minimal after MFR.  Added PROM to RUE - had had increased pain, but was able to tolerate.    Added elbow/wrist AROM to HEP and pt verbalized understanding.  OT Plan: P: Add ball stretches.  Follow up on HEP.   Goals Short Term Goals Short Term Goal 1: Patient will be educated on HEP.  Short Term Goal 1 Progress: Progressing  toward goal Short Term Goal 2: Patient will decrease fascial restrictions from max-mod amount.  Short Term Goal 2 Progress: Progressing toward goal Short Term Goal 3: Patient will increase PROM to [facility] to increase ability to donn upper body clothing.   Short Term Goal 3 Progress: Progressing toward goal Short Term Goal 4: Patient will increase strength to 3/5 to increase ability to perform keyboarding tasks.   Short Term Goal 4 Progress: Progressing toward goal Short Term Goal 5: Patient will decrease pain to 4/10 during daily tasks.  Short Term Goal 5 Progress: Progressing toward goal Long Term Goals Long Term Goal 1: Patient will return to highest level of independence during all daily and work tasks. Long Term Goal 1 Progress: Progressing toward goal Long Term Goal 2: Patient will decrease fascial restrictions from mod-min amount or less.  Long Term Goal 2 Progress: Progressing toward goal Long Term Goal 3: Patient will increase AROM to WNL to increase ability to reach into overhead cabinets.  Long Term Goal 3 Progress: Progressing toward goal Long Term Goal 4: Patient will increase strength to 5/5 to increase ability to lift and carry items during household chores.  Long Term Goal 4 Progress: Progressing toward goal Long Term Goal 5: Patient will decrease pain to 2/10 or less during daily and work tasks.  Long Term Goal 5 Progress: Progressing toward goal  Problem List Patient Active Problem List   Diagnosis Date Noted  . Muscle weakness (generalized) 03/26/2014  . Pain in joint, shoulder region 03/26/2014  . Decreased range of motion of right shoulder 03/26/2014  . Right clavicle fracture 03/19/2014  . Fatty liver 03/17/2014  . Rectal bleeding 03/17/2014  . Dyspepsia 03/11/2014  . Precordial  pain 09/11/2012  . Obesity, morbid 09/11/2012  . Hypothyroidism 09/11/2012  . ADHD (attention deficit hyperactivity disorder) 09/11/2012  . Anxiety 09/11/2012  . GERD 12/21/2009  . LIVER  FUNCTION TESTS, ABNORMAL, HX OF 04/29/2009  . IRRITABLE BOWEL SYNDROME 04/22/2009  . DYSPHAGIA UNSPECIFIED 04/22/2009  . ABDOMINAL PAIN RIGHT UPPER QUADRANT 04/22/2009  . ABDOMINAL PAIN RIGHT LOWER QUADRANT 04/22/2009  . LIPOMA OF OTHER SKIN AND SUBCUTANEOUS TISSUE 07/09/2008  . HYPERLIPIDEMIA 07/09/2008  . DEPRESSION 07/09/2008  . HYPERTENSION 07/09/2008  . PE 07/09/2008  . BRONCHITIS 07/09/2008  . GASTRITIS, CHRONIC 07/09/2008  . CALCULUS OF KIDNEY 07/09/2008  . SLEEP APNEA 07/09/2008  . ABDOMINAL PAIN, CHRONIC 07/09/2008    End of Session Activity Tolerance: Patient tolerated treatment well General Behavior During Therapy: [facility] for tasks assessed/performed OT Plan of Care OT Home Exercise Plan: towel slides      9/21 added elbow and wrist  AROM OT Patient Instructions: explained and demonstrated. provided handout (scanned)  Consulted and Agree with Plan of Care: Patient  [place], [place], OTR/L [phone]  03/30/2014, 5:53 PM

## 2014-03-31 NOTE — Progress Notes (Signed)
Quick Note:  Metavir score F4, which raises concern for advanced liver disease (cirrhosis). However, no stigmata of liver disease on EGD recently such as portal gastropathy, varices. Need to recheck in 6 months via elastography. Could go ahead and check viral markers. Needs to follow a low-fat diet, exercise, limit ETOH use.   ______

## 2014-04-01 ENCOUNTER — Ambulatory Visit (HOSPITAL_COMMUNITY)
Admission: RE | Admit: 2014-04-01 | Discharge: 2014-04-01 | Disposition: A | Discharge status: Home / Self Care | Payer: PRIVATE HEALTH INSURANCE | Source: Ambulatory Visit | Attending: Orthopedic Surgery | Admitting: Orthopedic Surgery

## 2014-04-01 DIAGNOSIS — M6281 Muscle weakness (generalized): Secondary | ICD-10-CM

## 2014-04-01 DIAGNOSIS — S42001D Fracture of unspecified part of right clavicle, subsequent encounter for fracture with routine healing: Secondary | ICD-10-CM

## 2014-04-01 DIAGNOSIS — IMO0001 Reserved for inherently not codable concepts without codable children: Secondary | ICD-10-CM | POA: Diagnosis not present

## 2014-04-01 DIAGNOSIS — M25511 Pain in right shoulder: Secondary | ICD-10-CM

## 2014-04-01 NOTE — Progress Notes (Signed)
Occupational Therapy Treatment Patient Details  Name: Yolanda [name] MRN: [id number] Date of Birth: 30-Sep-1955  Today's Date: 04/01/2014 Time: 9357-0177 OT Time Calculation (min): 33 min Manual therapy 1442 - 1500 18' Therapeutic exercises 1500-1515 15'  Visit#: 3 of 24  Re-eval: 04/23/14    Subjective S:  I have some pain today. Pain Assessment Currently in Pain?: Yes Pain Score: 3  Pain Location: Shoulder Pain Orientation: Right Pain Type: Acute pain  Precautions/Restrictions  Precautions Type of Shoulder Precautions: PROM, AAROM until follow-up appt (04/16/2014)  Exercise/Treatments Supine Protraction: PROM;10 reps Horizontal ABduction: PROM;10 reps External Rotation: PROM;10 reps Internal Rotation: PROM;10 reps Flexion: PROM;10 reps ABduction: PROM;10 reps Other Supine Exercises: Elbow flexion/extension, supination/pronation, wrist flexion/extension A/PROM 5 reps Seated Elevation: AROM;10 reps (OT unweighted arm and patient then able to complete) Extension: AROM;10 reps (mod tactile cues from OT) Row: AROM;10 reps (mod tactile cues for technique) Therapy Ball Flexion: 10 reps ABduction: 10 reps ROM / Strengthening / Isometric Strengthening   Flexion: Supine;3X3" Extension: Supine;3X3" External Rotation: Supine;3X3" Internal Rotation: Supine;3X3" ABduction: Supine;3X3" ADduction: Supine;3X3"     Manual Therapy Manual Therapy: Myofascial release Myofascial Release: Myofascial releae (MFR) and manual stretching to RUE bicep, tricep, upper arm, anterior shoulder, posterior shoulder, upper trap, and scapular regions to decrease pain and tenderness and promote improved range of motion  Occupational Therapy Assessment and Plan OT Assessment and Plan Clinical Impression Statement: A:  Tense with PROM and very guarded this date.  Able to complete PROM 10 tiems rather than 5 this date. OT Plan: P:  Increase repetitions with PROM and ball stretches.    Goals Short Term Goals Short Term Goal 1: Patient will be educated on HEP.  Short Term Goal 2: Patient will decrease fascial restrictions from max-mod amount.  Short Term Goal 3: Patient will increase PROM to [facility] to increase ability to donn upper body clothing.   Short Term Goal 4: Patient will increase strength to 3/5 to increase ability to perform keyboarding tasks.   Short Term Goal 5: Patient will decrease pain to 4/10 during daily tasks.  Long Term Goals Long Term Goal 1: Patient will return to highest level of independence during all daily and work tasks. Long Term Goal 2: Patient will decrease fascial restrictions from mod-min amount or less.  Long Term Goal 3: Patient will increase AROM to WNL to increase ability to reach into overhead cabinets.  Long Term Goal 4: Patient will increase strength to 5/5 to increase ability to lift and carry items during household chores.  Long Term Goal 5: Patient will decrease pain to 2/10 or less during daily and work tasks.   Problem List Patient Active Problem List   Diagnosis Date Noted  . Muscle weakness (generalized) 03/26/2014  . Pain in joint, shoulder region 03/26/2014  . Decreased range of motion of right shoulder 03/26/2014  . Right clavicle fracture 03/19/2014  . Fatty liver 03/17/2014  . Rectal bleeding 03/17/2014  . Dyspepsia 03/11/2014  . Precordial pain 09/11/2012  . Obesity, morbid 09/11/2012  . Hypothyroidism 09/11/2012  . ADHD (attention deficit hyperactivity disorder) 09/11/2012  . Anxiety 09/11/2012  . GERD 12/21/2009  . LIVER FUNCTION TESTS, ABNORMAL, HX OF 04/29/2009  . IRRITABLE BOWEL SYNDROME 04/22/2009  . DYSPHAGIA UNSPECIFIED 04/22/2009  . ABDOMINAL PAIN RIGHT UPPER QUADRANT 04/22/2009  . ABDOMINAL PAIN RIGHT LOWER QUADRANT 04/22/2009  . LIPOMA OF OTHER SKIN AND SUBCUTANEOUS TISSUE 07/09/2008  . HYPERLIPIDEMIA 07/09/2008  . DEPRESSION 07/09/2008  . HYPERTENSION 07/09/2008  .  PE 07/09/2008  . BRONCHITIS  07/09/2008  . GASTRITIS, CHRONIC 07/09/2008  . CALCULUS OF KIDNEY 07/09/2008  . SLEEP APNEA 07/09/2008  . ABDOMINAL PAIN, CHRONIC 07/09/2008    End of Session Activity Tolerance: Patient tolerated treatment well General Behavior During Therapy: [facility] for tasks assessed/performed  GO    Vangie Bicker, OTR/L [phone]  04/01/2014, 3:21 PM

## 2014-04-03 ENCOUNTER — Ambulatory Visit (HOSPITAL_COMMUNITY)
Admission: RE | Admit: 2014-04-03 | Discharge: 2014-04-03 | Disposition: A | Discharge status: Home / Self Care | Payer: PRIVATE HEALTH INSURANCE | Source: Ambulatory Visit | Attending: Orthopedic Surgery | Admitting: Orthopedic Surgery

## 2014-04-03 DIAGNOSIS — IMO0001 Reserved for inherently not codable concepts without codable children: Secondary | ICD-10-CM | POA: Diagnosis not present

## 2014-04-03 NOTE — Progress Notes (Signed)
Occupational Therapy Treatment Patient Details  Name: Yolanda [name] MRN: [id number] Date of Birth: 1955/12/18  Today's Date: 04/03/2014 Time: 4742-5956 OT Time Calculation (min): 38 min Manual therapy 3875-6433 21' Therapeutic exercises 1045-1102 17'  Visit#: 4 of 24  Re-eval: 04/23/14     Subjective S:  I think its getting there, its still not great.   Limitations: PROM only Pain Assessment Currently in Pain?: No/denies Pain Score: 0-No pain  Precautions/Restrictions   PROM only  Exercise/Treatments Supine Protraction: PROM;10 reps Horizontal ABduction: PROM;10 reps External Rotation: PROM;10 reps Internal Rotation: PROM;10 reps Flexion: PROM;10 reps ABduction: PROM;10 reps Other Supine Exercises: Elbow flexion/extension, supination/pronation, wrist flexion/extension A/PROM 15 times Other Supine Exercises: bridges 15 times Seated Elevation: AROM;15 reps (with min facilitation) Extension: AROM;15 reps Row: AROM Therapy Ball Flexion: 15 reps ABduction: 15 reps Isometric Strengthening   Flexion: Supine;3X5" Extension: Supine;3X5" External Rotation: Supine;3X5" Internal Rotation: Supine;3X5" ABduction: Supine;3X5" ADduction: Supine;3X5"    Manual Therapy Manual Therapy: Myofascial release Myofascial Release: Myofascial releae (MFR) and manual stretching to RUE bicep, tricep, upper arm, anterior shoulder, posterior shoulder, upper trap, and scapular regions to decrease pain and tenderness and promote improved range of motiontaken   Occupational Therapy Assessment and Plan OT Assessment and Plan Clinical Impression Statement: A:  Patient had significant improvement in tolerance of PROM, ability to complete extension and elevation with less faciitation, and added additional repetitons of all exercises.   OT Plan: P:  Complete extension and elevation without facilitation.    Goals Short Term Goals Short Term Goal 1: Patient will be educated on HEP.  Short  Term Goal 2: Patient will decrease fascial restrictions from max-mod amount.  Short Term Goal 3: Patient will increase PROM to [facility] to increase ability to donn upper body clothing.   Short Term Goal 4: Patient will increase strength to 3/5 to increase ability to perform keyboarding tasks.   Short Term Goal 5: Patient will decrease pain to 4/10 during daily tasks.  Long Term Goals Long Term Goal 1: Patient will return to highest level of independence during all daily and work tasks. Long Term Goal 2: Patient will decrease fascial restrictions from mod-min amount or less.  Long Term Goal 3: Patient will increase AROM to WNL to increase ability to reach into overhead cabinets.  Long Term Goal 4: Patient will increase strength to 5/5 to increase ability to lift and carry items during household chores.  Long Term Goal 5: Patient will decrease pain to 2/10 or less during daily and work tasks.   Problem List Patient Active Problem List   Diagnosis Date Noted  . Muscle weakness (generalized) 03/26/2014  . Pain in joint, shoulder region 03/26/2014  . Decreased range of motion of right shoulder 03/26/2014  . Right clavicle fracture 03/19/2014  . Fatty liver 03/17/2014  . Rectal bleeding 03/17/2014  . Dyspepsia 03/11/2014  . Precordial pain 09/11/2012  . Obesity, morbid 09/11/2012  . Hypothyroidism 09/11/2012  . ADHD (attention deficit hyperactivity disorder) 09/11/2012  . Anxiety 09/11/2012  . GERD 12/21/2009  . LIVER FUNCTION TESTS, ABNORMAL, HX OF 04/29/2009  . IRRITABLE BOWEL SYNDROME 04/22/2009  . DYSPHAGIA UNSPECIFIED 04/22/2009  . ABDOMINAL PAIN RIGHT UPPER QUADRANT 04/22/2009  . ABDOMINAL PAIN RIGHT LOWER QUADRANT 04/22/2009  . LIPOMA OF OTHER SKIN AND SUBCUTANEOUS TISSUE 07/09/2008  . HYPERLIPIDEMIA 07/09/2008  . DEPRESSION 07/09/2008  . HYPERTENSION 07/09/2008  . PE 07/09/2008  . BRONCHITIS 07/09/2008  . GASTRITIS, CHRONIC 07/09/2008  . CALCULUS OF KIDNEY 07/09/2008  .  SLEEP  APNEA 07/09/2008  . ABDOMINAL PAIN, CHRONIC 07/09/2008    End of Session Activity Tolerance: Patient tolerated treatment well General Behavior During Therapy: [facility] for tasks assessed/performed  GO    Vangie Bicker, OTR/L [phone]  04/03/2014, 11:04 AM

## 2014-04-06 ENCOUNTER — Other Ambulatory Visit: Payer: Self-pay

## 2014-04-06 ENCOUNTER — Ambulatory Visit (HOSPITAL_COMMUNITY)
Admission: RE | Admit: 2014-04-06 | Discharge: 2014-04-06 | Disposition: A | Discharge status: Home / Self Care | Payer: PRIVATE HEALTH INSURANCE | Source: Ambulatory Visit | Attending: Family Medicine | Admitting: Family Medicine

## 2014-04-06 DIAGNOSIS — IMO0001 Reserved for inherently not codable concepts without codable children: Secondary | ICD-10-CM | POA: Diagnosis not present

## 2014-04-06 DIAGNOSIS — K760 Fatty (change of) liver, not elsewhere classified: Secondary | ICD-10-CM

## 2014-04-06 NOTE — Progress Notes (Signed)
Quick Note:  LMOM for a return call. Lab orders have been faxed to [facility]. ______

## 2014-04-06 NOTE — Progress Notes (Signed)
Note reviewed by clinical instructor and accurately reflects treatment session.    Ailene Ravel, OTR/L,CBIS

## 2014-04-06 NOTE — Progress Notes (Signed)
Quick Note:  Pt is aware. Lab orders have been faxed and she will go tomorrow. Low fat diet mailed. ______

## 2014-04-06 NOTE — Progress Notes (Signed)
Occupational Therapy Treatment Patient Details  Name: Yolanda [name] MRN: [id number] Date of Birth: [birth year]  Today's Date: 04/06/2014 Time: 4332-9518 OT Time Calculation (min): 46 min MFR: 8416-6063 01' Therex: 6010-9323  27'  Visit#: 5 of 24  Re-eval: 04/23/14   Subjective Symptoms/Limitations Symptoms: S: It's hurting a lot, I'm thinking it's the weather.  Pain Assessment Currently in Pain?: Yes Pain Score: 4  Pain Location: Shoulder Pain Orientation: Right Pain Type: Acute pain  Precautions/Restrictions  Precautions Precautions: Shoulder Type of Shoulder Precautions: PROM, AAROM until follow-up appt (04/16/2014)  Exercise/Treatments Supine Protraction: PROM;10 reps Horizontal ABduction: PROM;10 reps External Rotation: PROM;10 reps Internal Rotation: PROM;10 reps Flexion: PROM;10 reps ABduction: PROM;10 reps Other Supine Exercises: bridges 15 times Seated Elevation: AROM;15 reps Extension: AROM;15 reps Row: AROM;15 reps   Therapy Ball Flexion: 15 reps ABduction: 15 reps ROM / Strengthening / Isometric Strengthening   Flexion: Supine;3X5" Extension: Supine;3X5" External Rotation: Supine;3X5" Internal Rotation: Supine;3X5" ABduction: Supine;3X5" ADduction: Supine;3X5"    Manual Therapy Manual Therapy: Myofascial release Myofascial Release: Myofascial releae (MFR) and manual stretching to RUE bicep, tricep, upper arm, anterior shoulder, posterior shoulder, upper trap, and scapular regions to decrease pain and tenderness and promote improved range of motion  Occupational Therapy Assessment and Plan OT Assessment and Plan Clinical Impression Statement: A: Patient reported increased pain and soreness at beginning of therapy session. Pt able to complete extension and elevation without facilitation. Patient tolerated treatment well.  OT Plan: P:  Continue to focus on increasing PROM.  Increase isometric exercises to 5X5".   Goals Short Term Goals Short  Term Goal 1: Patient will be educated on HEP.  Short Term Goal 1 Progress: Progressing toward goal Short Term Goal 2: Patient will decrease fascial restrictions from max-mod amount.  Short Term Goal 2 Progress: Progressing toward goal Short Term Goal 3: Patient will increase PROM to [facility] to increase ability to donn upper body clothing.   Short Term Goal 3 Progress: Progressing toward goal Short Term Goal 4: Patient will increase strength to 3/5 to increase ability to perform keyboarding tasks.   Short Term Goal 4 Progress: Progressing toward goal Short Term Goal 5: Patient will decrease pain to 4/10 during daily tasks.  Short Term Goal 5 Progress: Progressing toward goal Long Term Goals Long Term Goal 1: Patient will return to highest level of independence during all daily and work tasks. Long Term Goal 1 Progress: Progressing toward goal Long Term Goal 2: Patient will decrease fascial restrictions from mod-min amount or less.  Long Term Goal 2 Progress: Progressing toward goal Long Term Goal 3: Patient will increase AROM to WNL to increase ability to reach into overhead cabinets.  Long Term Goal 3 Progress: Progressing toward goal Long Term Goal 4: Patient will increase strength to 5/5 to increase ability to lift and carry items during household chores.  Long Term Goal 4 Progress: Progressing toward goal Long Term Goal 5: Patient will decrease pain to 2/10 or less during daily and work tasks.  Long Term Goal 5 Progress: Progressing toward goal  Problem List Patient Active Problem List   Diagnosis Date Noted  . Muscle weakness (generalized) 03/26/2014  . Pain in joint, shoulder region 03/26/2014  . Decreased range of motion of right shoulder 03/26/2014  . Right clavicle fracture 03/19/2014  . Fatty liver 03/17/2014  . Rectal bleeding 03/17/2014  . Dyspepsia 03/11/2014  . Precordial pain 09/11/2012  . Obesity, morbid 09/11/2012  . Hypothyroidism 09/11/2012  . ADHD (attention deficit  hyperactivity disorder) 09/11/2012  . Anxiety 09/11/2012  . GERD 12/21/2009  . LIVER FUNCTION TESTS, ABNORMAL, HX OF 04/29/2009  . IRRITABLE BOWEL SYNDROME 04/22/2009  . DYSPHAGIA UNSPECIFIED 04/22/2009  . ABDOMINAL PAIN RIGHT UPPER QUADRANT 04/22/2009  . ABDOMINAL PAIN RIGHT LOWER QUADRANT 04/22/2009  . LIPOMA OF OTHER SKIN AND SUBCUTANEOUS TISSUE 07/09/2008  . HYPERLIPIDEMIA 07/09/2008  . DEPRESSION 07/09/2008  . HYPERTENSION 07/09/2008  . PE 07/09/2008  . BRONCHITIS 07/09/2008  . GASTRITIS, CHRONIC 07/09/2008  . CALCULUS OF KIDNEY 07/09/2008  . SLEEP APNEA 07/09/2008  . ABDOMINAL PAIN, CHRONIC 07/09/2008    End of Session Activity Tolerance: Patient tolerated treatment well General Behavior During Therapy: [facility] for tasks assessed/performed     Guadelupe Sabin. OT Student  04/06/2014, 10:25 AM

## 2014-04-08 ENCOUNTER — Ambulatory Visit (HOSPITAL_COMMUNITY)
Admission: RE | Admit: 2014-04-08 | Discharge: 2014-04-08 | Disposition: A | Discharge status: Home / Self Care | Payer: PRIVATE HEALTH INSURANCE | Source: Ambulatory Visit | Attending: Family Medicine | Admitting: Family Medicine

## 2014-04-08 DIAGNOSIS — IMO0001 Reserved for inherently not codable concepts without codable children: Secondary | ICD-10-CM | POA: Diagnosis not present

## 2014-04-08 NOTE — Progress Notes (Signed)
Occupational Therapy Treatment Patient Details  Name: Yolanda [name] MRN: [id number] Date of Birth: 1955-08-10  Today's Date: 04/08/2014 Time: 8676-7209 OT Time Calculation (min): 40 min MFR: 4709-6283 17' Therex: 6629-4765 23'  Visit#: 6 of 24  Re-eval: 04/23/14    Subjective Symptoms/Limitations Symptoms: S: It's doing better today. I've been trying to stretch it out some.   Pain Assessment Currently in Pain?: No/denies  Precautions/Restrictions  Precautions Precautions: Shoulder Type of Shoulder Precautions: PROM, AAROM until follow-up appt (04/16/2014)  Exercise/Treatments Supine Protraction: PROM;10 reps Horizontal ABduction: PROM;10 reps External Rotation: PROM;10 reps Internal Rotation: PROM;10 reps Flexion: PROM;10 reps ABduction: PROM;10 reps Other Supine Exercises: bridges 15 times Seated Elevation: AROM;15 reps Extension: AROM;15 reps Row: AROM;15 reps   Therapy Ball Flexion: 15 reps ABduction: 15 reps ROM / Strengthening / Isometric Strengthening   Flexion: Supine;5X5" Extension: Supine;5X5" External Rotation: Supine;5X5" Internal Rotation: Supine;5X5" ABduction: Supine;5X5" ADduction: Supine;5X5"      Manual Therapy Manual Therapy: Myofascial release Myofascial Release: Myofascial releae (MFR) and manual stretching to RUE bicep, tricep, upper arm, anterior shoulder, posterior shoulder, upper trap, and scapular regions to decrease pain and tenderness and promote improved range of motion  Occupational Therapy Assessment and Plan OT Assessment and Plan Clinical Impression Statement: A: Increased isometrics to 5X5". Patient tolerated treatment well. Patient reported decreased pain over last couple of days, has been using ice for pain management.  No pain reported at beginning of session.  OT Plan: P:  Continue to focus on increasing PROM.     Goals Short Term Goals Short Term Goal 1: Patient will be educated on HEP.  Short Term Goal 2:  Patient will decrease fascial restrictions from max-mod amount.  Short Term Goal 3: Patient will increase PROM to [facility] to increase ability to donn upper body clothing.   Short Term Goal 4: Patient will increase strength to 3/5 to increase ability to perform keyboarding tasks.   Short Term Goal 5: Patient will decrease pain to 4/10 during daily tasks.  Long Term Goals Long Term Goal 1: Patient will return to highest level of independence during all daily and work tasks. Long Term Goal 2: Patient will decrease fascial restrictions from mod-min amount or less.  Long Term Goal 3: Patient will increase AROM to WNL to increase ability to reach into overhead cabinets.  Long Term Goal 4: Patient will increase strength to 5/5 to increase ability to lift and carry items during household chores.  Long Term Goal 5: Patient will decrease pain to 2/10 or less during daily and work tasks.   Problem List Patient Active Problem List   Diagnosis Date Noted  . Muscle weakness (generalized) 03/26/2014  . Pain in joint, shoulder region 03/26/2014  . Decreased range of motion of right shoulder 03/26/2014  . Right clavicle fracture 03/19/2014  . Fatty liver 03/17/2014  . Rectal bleeding 03/17/2014  . Dyspepsia 03/11/2014  . Precordial pain 09/11/2012  . Obesity, morbid 09/11/2012  . Hypothyroidism 09/11/2012  . ADHD (attention deficit hyperactivity disorder) 09/11/2012  . Anxiety 09/11/2012  . GERD 12/21/2009  . LIVER FUNCTION TESTS, ABNORMAL, HX OF 04/29/2009  . IRRITABLE BOWEL SYNDROME 04/22/2009  . DYSPHAGIA UNSPECIFIED 04/22/2009  . ABDOMINAL PAIN RIGHT UPPER QUADRANT 04/22/2009  . ABDOMINAL PAIN RIGHT LOWER QUADRANT 04/22/2009  . LIPOMA OF OTHER SKIN AND SUBCUTANEOUS TISSUE 07/09/2008  . HYPERLIPIDEMIA 07/09/2008  . DEPRESSION 07/09/2008  . HYPERTENSION 07/09/2008  . PE 07/09/2008  . BRONCHITIS 07/09/2008  . GASTRITIS, CHRONIC 07/09/2008  . CALCULUS  OF KIDNEY 07/09/2008  . SLEEP APNEA 07/09/2008   . ABDOMINAL PAIN, CHRONIC 07/09/2008    End of Session Activity Tolerance: Patient tolerated treatment well General Behavior During Therapy: [facility] for tasks assessed/performed      Guadelupe Sabin. OT Student  04/08/2014, 8:50 AM

## 2014-04-08 NOTE — Progress Notes (Signed)
Note reviewed by clinical instructor and accurately reflects treatment session.  Ailene Ravel, OTR/L,CBIS

## 2014-04-09 LAB — HEPATITIS C ANTIBODY: HCV AB: NEGATIVE

## 2014-04-09 LAB — HEPATITIS B SURFACE ANTIGEN: Hepatitis B Surface Ag: NEGATIVE

## 2014-04-10 ENCOUNTER — Ambulatory Visit (HOSPITAL_COMMUNITY)
Admission: RE | Admit: 2014-04-10 | Discharge: 2014-04-10 | Disposition: A | Discharge status: Home / Self Care | Payer: PRIVATE HEALTH INSURANCE | Source: Ambulatory Visit | Attending: Family Medicine | Admitting: Family Medicine

## 2014-04-10 DIAGNOSIS — Z5189 Encounter for other specified aftercare: Secondary | ICD-10-CM | POA: Insufficient documentation

## 2014-04-10 DIAGNOSIS — I10 Essential (primary) hypertension: Secondary | ICD-10-CM | POA: Insufficient documentation

## 2014-04-10 DIAGNOSIS — M25511 Pain in right shoulder: Secondary | ICD-10-CM | POA: Diagnosis not present

## 2014-04-10 DIAGNOSIS — M6281 Muscle weakness (generalized): Secondary | ICD-10-CM | POA: Insufficient documentation

## 2014-04-10 NOTE — Progress Notes (Signed)
Note reviewed by clinical instructor and accurately reflects treatment session.  Ailene Ravel, OTR/L,CBIS

## 2014-04-10 NOTE — Progress Notes (Signed)
Occupational Therapy Treatment Patient Details  Name: Yolanda [name] MRN: [id number] Date of Birth: Mar 29, 1956  Today's Date: 04/10/2014 Time: 6151-8343 OT Time Calculation (min): 38 min MFR: 7357-8978 24' Therex: 4784-1282 14'  Visit#: 7 of 24  Re-eval: 04/23/14    Subjective Symptoms/Limitations Symptoms: S: It's hurting a lot worse than it did yesterday, it felt good most of the day yesterday.  Pain Assessment Currently in Pain?: Yes Pain Score: 4  Pain Location: Shoulder Pain Orientation: Right Pain Type: Acute pain  Precautions/Restrictions  Precautions Precautions: Shoulder Type of Shoulder Precautions: PROM, AAROM until follow-up appt (04/16/2014)  Exercise/Treatments Supine Protraction PROM;10 reps  Horizontal ABduction PROM;10 reps  External Rotation PROM;10 reps  Internal Rotation PROM;10 reps  Flexion PROM;10 reps  ABduction PROM;10 reps  Other Supine Exercises bridges 15 times   Seated Elevation AROM;15 reps  Extension AROM;15 reps  Row AROM;15 reps   Therapy Ball Flexion 15 reps  ABduction 15 reps              Manual Therapy Manual Therapy: Myofascial release Myofascial Release: Myofascial releae (MFR) and manual stretching to RUE bicep, tricep, upper arm, anterior shoulder, posterior shoulder, upper trap, and scapular regions to decrease pain and tenderness and promote improved range of motion  Occupational Therapy Assessment and Plan OT Assessment and Plan Clinical Impression Statement: A: Continued PROM exercises, PROM increased minimal amount. Patient tolerated treatment fair. Patient reported increased pain at beginning of session, persisting throughout treatment session. Patient reports she is using ice and pain medication for pain management.  OT Plan: P:  Continue to focus on increasing PROM.  Attempt to resume isometrics if patient tolerates.    Goals Short Term Goals Short Term Goal 1: Patient will be educated on HEP.  Short  Term Goal 2: Patient will decrease fascial restrictions from max-mod amount.  Short Term Goal 3: Patient will increase PROM to [facility] to increase ability to donn upper body clothing.   Short Term Goal 4: Patient will increase strength to 3/5 to increase ability to perform keyboarding tasks.   Short Term Goal 5: Patient will decrease pain to 4/10 during daily tasks.  Long Term Goals Long Term Goal 1: Patient will return to highest level of independence during all daily and work tasks. Long Term Goal 2: Patient will decrease fascial restrictions from mod-min amount or less.  Long Term Goal 3: Patient will increase AROM to WNL to increase ability to reach into overhead cabinets.  Long Term Goal 4: Patient will increase strength to 5/5 to increase ability to lift and carry items during household chores.  Long Term Goal 5: Patient will decrease pain to 2/10 or less during daily and work tasks.   Problem List Patient Active Problem List   Diagnosis Date Noted  . Muscle weakness (generalized) 03/26/2014  . Pain in joint, shoulder region 03/26/2014  . Decreased range of motion of right shoulder 03/26/2014  . Right clavicle fracture 03/19/2014  . Fatty liver 03/17/2014  . Rectal bleeding 03/17/2014  . Dyspepsia 03/11/2014  . Precordial pain 09/11/2012  . Obesity, morbid 09/11/2012  . Hypothyroidism 09/11/2012  . ADHD (attention deficit hyperactivity disorder) 09/11/2012  . Anxiety 09/11/2012  . GERD 12/21/2009  . LIVER FUNCTION TESTS, ABNORMAL, HX OF 04/29/2009  . IRRITABLE BOWEL SYNDROME 04/22/2009  . DYSPHAGIA UNSPECIFIED 04/22/2009  . ABDOMINAL PAIN RIGHT UPPER QUADRANT 04/22/2009  . ABDOMINAL PAIN RIGHT LOWER QUADRANT 04/22/2009  . LIPOMA OF OTHER SKIN AND SUBCUTANEOUS TISSUE 07/09/2008  . HYPERLIPIDEMIA  07/09/2008  . DEPRESSION 07/09/2008  . HYPERTENSION 07/09/2008  . PE 07/09/2008  . BRONCHITIS 07/09/2008  . GASTRITIS, CHRONIC 07/09/2008  . CALCULUS OF KIDNEY 07/09/2008  . SLEEP  APNEA 07/09/2008  . ABDOMINAL PAIN, CHRONIC 07/09/2008    End of Session Activity Tolerance: Patient tolerated treatment well General Behavior During Therapy: [facility] for tasks assessed/performed   Guadelupe Sabin. OT Student  04/10/2014, 12:09 PM

## 2014-04-13 ENCOUNTER — Encounter: Payer: Self-pay | Admitting: Gastroenterology

## 2014-04-13 ENCOUNTER — Ambulatory Visit (HOSPITAL_COMMUNITY)
Admission: RE | Admit: 2014-04-13 | Discharge: 2014-04-13 | Disposition: A | Discharge status: Home / Self Care | Payer: PRIVATE HEALTH INSURANCE | Source: Ambulatory Visit | Attending: Orthopedic Surgery | Admitting: Orthopedic Surgery

## 2014-04-13 DIAGNOSIS — Z5189 Encounter for other specified aftercare: Secondary | ICD-10-CM | POA: Diagnosis not present

## 2014-04-13 NOTE — Progress Notes (Signed)
Quick Note:  Viral markers negative.  Repeat US in 6 months.  Routine office visit with [place] in 6 weeks ______

## 2014-04-13 NOTE — Progress Notes (Signed)
Occupational Therapy Treatment Patient Details  Name: Yolanda [name] MRN: [id number] Date of Birth: 05-03-1956  Today's Date: 04/13/2014 Time: 6712-4580 OT Time Calculation (min): 40 min MFR [phone] 20' Therex 250-539 20'  Visit#: 8 of 24  Re-eval: 04/23/14     Subjective Symptoms/Limitations Symptoms: S: This weekend I had the heating pad on my shoulder the whole time. it helped a lot.  Pain Assessment Currently in Pain?: Yes Pain Score: 2  Pain Location: Shoulder Pain Orientation: Right Pain Type: Acute pain  Precautions/Restrictions  Precautions Precautions: Shoulder Type of Shoulder Precautions: PROM, AAROM until follow-up appt (04/16/2014)  Exercise/Treatments Supine Protraction: PROM;10 reps Horizontal ABduction: PROM;10 reps External Rotation: PROM;10 reps Internal Rotation: PROM;10 reps Flexion: PROM;10 reps ABduction: PROM;10 reps Other Supine Exercises: bridges 20 times Seated Elevation: AROM;15 reps Extension: AROM;15 reps Row: AROM;15 reps Other Seated Exercises: Elbow flexion/extension 15X Therapy Ball Flexion: 15 reps ABduction: 15 reps ROM / Strengthening / Isometric Strengthening Thumb Tacks: 1' Prot/Ret//Elev/Dep: 1' Flexion: Supine;5X5" Extension: Supine;5X5" External Rotation: Supine;5X5" Internal Rotation: Supine;5X5" ABduction: Supine;5X5" ADduction: Supine;5X5"     Manual Therapy Manual Therapy: Myofascial release Myofascial Release: Myofascial releae (MFR) and manual stretching to RUE bicep, tricep, upper arm, anterior shoulder, posterior shoulder, upper trap, and scapular regions to decrease pain and tenderness and promote improved range of motion  Occupational Therapy Assessment and Plan OT Assessment and Plan Clinical Impression Statement: A: Pt experiencing less pain this AM compared to Friday. Pt required a pillow under right elbow for arm support during myofascial release. Pt had several tender areas in upper arm and  upper trapezius. Added thumb tacks and pro/ret/elev/dep. Patient tolerated well.  OT Plan: P: Cont to work on increasing joint mobility with supine flexion. Attempt AAROM supine if able to tolerate.   Goals Short Term Goals Time to Complete Short Term Goals: 4 weeks Short Term Goal 1: Patient will be educated on HEP.  Short Term Goal 1 Progress: Progressing toward goal Short Term Goal 2: Patient will decrease fascial restrictions from max-mod amount.  Short Term Goal 2 Progress: Progressing toward goal Short Term Goal 3: Patient will increase PROM to [facility] to increase ability to donn upper body clothing.   Short Term Goal 3 Progress: Progressing toward goal Short Term Goal 4: Patient will increase strength to 3/5 to increase ability to perform keyboarding tasks.   Short Term Goal 4 Progress: Progressing toward goal Short Term Goal 5: Patient will decrease pain to 4/10 during daily tasks.  Short Term Goal 5 Progress: Progressing toward goal Long Term Goals Time to Complete Long Term Goals: 8 weeks Long Term Goal 1: Patient will return to highest level of independence during all daily and work tasks. Long Term Goal 1 Progress: Progressing toward goal Long Term Goal 2: Patient will decrease fascial restrictions from mod-min amount or less.  Long Term Goal 2 Progress: Progressing toward goal Long Term Goal 3: Patient will increase AROM to WNL to increase ability to reach into overhead cabinets.  Long Term Goal 3 Progress: Progressing toward goal Long Term Goal 4: Patient will increase strength to 5/5 to increase ability to lift and carry items during household chores.  Long Term Goal 4 Progress: Progressing toward goal Long Term Goal 5: Patient will decrease pain to 2/10 or less during daily and work tasks.  Long Term Goal 5 Progress: Progressing toward goal  Problem List Patient Active Problem List   Diagnosis Date Noted  . Muscle weakness (generalized) 03/26/2014  . Pain in  joint,  shoulder region 03/26/2014  . Decreased range of motion of right shoulder 03/26/2014  . Right clavicle fracture 03/19/2014  . Fatty liver 03/17/2014  . Rectal bleeding 03/17/2014  . Dyspepsia 03/11/2014  . Precordial pain 09/11/2012  . Obesity, morbid 09/11/2012  . Hypothyroidism 09/11/2012  . ADHD (attention deficit hyperactivity disorder) 09/11/2012  . Anxiety 09/11/2012  . GERD 12/21/2009  . LIVER FUNCTION TESTS, ABNORMAL, HX OF 04/29/2009  . IRRITABLE BOWEL SYNDROME 04/22/2009  . DYSPHAGIA UNSPECIFIED 04/22/2009  . ABDOMINAL PAIN RIGHT UPPER QUADRANT 04/22/2009  . ABDOMINAL PAIN RIGHT LOWER QUADRANT 04/22/2009  . LIPOMA OF OTHER SKIN AND SUBCUTANEOUS TISSUE 07/09/2008  . HYPERLIPIDEMIA 07/09/2008  . DEPRESSION 07/09/2008  . HYPERTENSION 07/09/2008  . PE 07/09/2008  . BRONCHITIS 07/09/2008  . GASTRITIS, CHRONIC 07/09/2008  . CALCULUS OF KIDNEY 07/09/2008  . SLEEP APNEA 07/09/2008  . ABDOMINAL PAIN, CHRONIC 07/09/2008    End of Session Activity Tolerance: Patient tolerated treatment well General Behavior During Therapy: [facility] for tasks assessed/performed   Ailene Ravel, OTR/L,CBIS   04/13/2014, 9:30 AM

## 2014-04-13 NOTE — Progress Notes (Signed)
Quick Note:  Called and informed pt. ______

## 2014-04-13 NOTE — Progress Notes (Signed)
APPT MADE, LETTER SENT. ULTRASOUND NIC'D

## 2014-04-15 ENCOUNTER — Ambulatory Visit (HOSPITAL_COMMUNITY)
Admission: RE | Admit: 2014-04-15 | Discharge: 2014-04-15 | Disposition: A | Discharge status: Home / Self Care | Payer: PRIVATE HEALTH INSURANCE | Source: Ambulatory Visit | Attending: Family Medicine | Admitting: Family Medicine

## 2014-04-15 DIAGNOSIS — Z5189 Encounter for other specified aftercare: Secondary | ICD-10-CM | POA: Diagnosis not present

## 2014-04-15 NOTE — Evaluation (Addendum)
Occupational Therapy Progress Note  Patient Details  Name: Yolanda [name] MRN: [id number] Date of Birth: 1955-12-06  Today's Date: 04/15/2014 Time: 7035-0093   Manual therapy [phone] 24' rom 920-930 10' Therapeutic exercises 930-940 10'  Visit#: 9 of 24  Re-eval: 05/13/14  Assessment Diagnosis: Right clavical fracture   Past Medical History:  Past Medical History  Diagnosis Date  . Fibromyalgia   . Hypertension   . PE (pulmonary embolism)   . Depression   . Chronic pain in left foot   . Chronic abdominal pain   . IBS (irritable bowel syndrome)   . Gastritis   . Internal hemorrhoids   . Kidney stone   . Thyroid disease   . Sleep apnea     hasn't worn a CPAP in 6 months   Past Surgical History:  Past Surgical History  Procedure Laterality Date  . Cesarean section      X2  . Breast lumpectomy    . Dilation and curettage of uterus    . Abdominal surgery      laparoscopy  . Cystoscopy w/ retrogrades  01/23/2012    Procedure: CYSTOSCOPY WITH RETROGRADE PYELOGRAM;  Surgeon: Marissa Nestle, MD;  Location: [facility];  Service: Urology;  Laterality: Left;  . Removal of stones  01/23/2012    Procedure: REMOVAL OF STONES;  Surgeon: Marissa Nestle, MD;  Location: [facility];  Service: Urology;  Laterality: N/A;  . Colonoscopy  2006    internal hemorrhoids  . Esophagogastroduodenoscopy   08/24/2006    Dr. Veto Kemps erythema of the antrum without erosion or ulcers/Otherwise, normal esophagus without evidence of Barrett's path with chronic gastritis  . Colonoscopy with propofol N/A 03/24/2014    Procedure: COLONOSCOPY WITH PROPOFOL;  Surgeon: Danie Binder, MD;  Location: [facility];  Service: Endoscopy;  Laterality: N/A;  in cecum at 0759; withdrawal time 15 minutes  . Polypectomy N/A 03/24/2014    Procedure: POLYPECTOMY;  Surgeon: Danie Binder, MD;  Location: [facility];  Service: Endoscopy;  Laterality: N/A;  . Esophagogastroduodenoscopy (egd) with propofol N/A 03/24/2014   Procedure: ESOPHAGOGASTRODUODENOSCOPY (EGD) WITH PROPOFOL;  Surgeon: Danie Binder, MD;  Location: [facility];  Service: Endoscopy;  Laterality: N/A;  . Esophageal biopsy N/A 03/24/2014    Procedure: GASTRIC BIOPSIES;  Surgeon: Danie Binder, MD;  Location: [facility];  Service: Endoscopy;  Laterality: N/A;    Subjective S:  I just want to get rid of this toothache feeling.  I can shower and don and doff shirts easier, not like before but easier.  I can sweep some using my right arm a little bit.  I can't use my heavy dishes or reach overhead or wash the back of my head real well.   Limitations: PROM only Special Tests: FOTO score was 41/100 and is currently 44/100 Pain Assessment Currently in Pain?: Yes Pain Score: 2  Pain Location: Shoulder Pain Orientation: Right Pain Type: Acute pain  Precautions/Restrictions  Precautions Precautions: Shoulder Type of Shoulder Precautions: PROM, AAROM until follow-up appt (04/16/2014)   Assessment Additional Assessments RUE PROM (degrees) RUE Overall PROM Comments: assessed in supine (03/26/14) Right Shoulder Flexion: 142 Degrees (130) Right Shoulder ABduction: 120 Degrees (86) Right Shoulder Internal Rotation: 90 Degrees (87) Right Shoulder External Rotation: 70 Degrees (40) Palpation Palpation: moderate fascial restrictions in right shoulder region     Exercise/Treatments Supine Protraction: PROM;10 reps Horizontal ABduction: PROM;10 reps External Rotation: PROM;10 reps Internal Rotation: PROM;10 reps Flexion: PROM;10 reps ABduction: PROM;10 reps Seated  Elevation: AROM;15 reps Extension: AROM;15 reps Row: AROM;15 reps Other Seated Exercises: Elbow flexion/extension 15X   Therapy Ball Flexion: 20 reps;Limitations Flexion Limitations: min vg and tactile cues to hold stretch at end range and to depress shoulder blade.  ABduction: 20 reps ROM / Strengthening / Isometric Strengthening Thumb Tacks: 1' (unweighted arm) Prot/Ret//Elev/Dep:  1' (unweighted arm)       Manual Therapy Manual Therapy: Myofascial release Myofascial Release: Myofascial release (MFR) and manual stretching to RUE bicep, tricep, upper arm, anterior shoulder, posterior shoulder, upper trap, and scapular regions to decrease pain and tenderness and promote improved range of motion  Occupational Therapy Assessment and Plan OT Assessment and Plan Clinical Impression Statement: A:  Patient has made significant progress in PROM since initial evaluation.  Able to lay supine on mat table without pillow under right arm for support.   OT Frequency: Min 3X/week OT Duration: 4 weeks OT Plan: P:  Begin AAROM in supine in order to gain range of motion needed to return to prior level of function with all daily activities.    Goals Short Term Goals Time to Complete Short Term Goals: 4 weeks Short Term Goal 1: Patient will be educated on HEP.  Short Term Goal 1 Progress: Met Short Term Goal 2: Patient will decrease fascial restrictions from max-mod amount.  Short Term Goal 2 Progress: Met Short Term Goal 3: Patient will increase PROM to [facility] to increase ability to donn upper body clothing.   Short Term Goal 3 Progress: Progressing toward goal Short Term Goal 4: Patient will increase strength to 3/5 to increase ability to perform keyboarding tasks.   Short Term Goal 4 Progress: Progressing toward goal Short Term Goal 5: Patient will decrease pain to 4/10 during daily tasks.  Short Term Goal 5 Progress: Met Long Term Goals Time to Complete Long Term Goals: 8 weeks Long Term Goal 1: Patient will return to highest level of independence during all daily and work tasks. Long Term Goal 1 Progress: Progressing toward goal Long Term Goal 2: Patient will decrease fascial restrictions from mod-min amount or less.  Long Term Goal 2 Progress: Progressing toward goal Long Term Goal 3: Patient will increase AROM to WNL to increase ability to reach into overhead cabinets.  Long  Term Goal 3 Progress: Progressing toward goal Long Term Goal 4: Patient will increase strength to 5/5 to increase ability to lift and carry items during household chores.  Long Term Goal 4 Progress: Progressing toward goal Long Term Goal 5: Patient will decrease pain to 2/10 or less during daily and work tasks.  Long Term Goal 5 Progress: Progressing toward goal  Problem List Patient Active Problem List   Diagnosis Date Noted  . Muscle weakness (generalized) 03/26/2014  . Pain in joint, shoulder region 03/26/2014  . Decreased range of motion of right shoulder 03/26/2014  . Right clavicle fracture 03/19/2014  . Fatty liver 03/17/2014  . Rectal bleeding 03/17/2014  . Dyspepsia 03/11/2014  . Precordial pain 09/11/2012  . Obesity, morbid 09/11/2012  . Hypothyroidism 09/11/2012  . ADHD (attention deficit hyperactivity disorder) 09/11/2012  . Anxiety 09/11/2012  . GERD 12/21/2009  . LIVER FUNCTION TESTS, ABNORMAL, HX OF 04/29/2009  . IRRITABLE BOWEL SYNDROME 04/22/2009  . DYSPHAGIA UNSPECIFIED 04/22/2009  . ABDOMINAL PAIN RIGHT UPPER QUADRANT 04/22/2009  . ABDOMINAL PAIN RIGHT LOWER QUADRANT 04/22/2009  . LIPOMA OF OTHER SKIN AND SUBCUTANEOUS TISSUE 07/09/2008  . HYPERLIPIDEMIA 07/09/2008  . DEPRESSION 07/09/2008  . HYPERTENSION 07/09/2008  . PE  07/09/2008  . BRONCHITIS 07/09/2008  . GASTRITIS, CHRONIC 07/09/2008  . CALCULUS OF KIDNEY 07/09/2008  . SLEEP APNEA 07/09/2008  . ABDOMINAL PAIN, CHRONIC 07/09/2008    End of Session Activity Tolerance: Patient tolerated treatment well General Behavior During Therapy: [facility] for tasks assessed/performed OT Plan of Care OT Home Exercise Plan: towel slides      9/21 added elbow and wrist  AROM  GO    Vangie Bicker, OTR/L [phone]  04/15/2014, 9:43 AM  Physician Documentation Your signature is required to indicate approval of the treatment plan as stated above.  Please sign and either send electronically or make a copy of  this report for your files and return this physician signed original.  Please mark one 1.__approve of plan  2. ___approve of plan with the following conditions.   ______________________________                                                          _____________________ Physician Signature                                                                                                             Date

## 2014-04-16 ENCOUNTER — Encounter: Payer: Self-pay | Admitting: Orthopedic Surgery

## 2014-04-16 ENCOUNTER — Ambulatory Visit (INDEPENDENT_AMBULATORY_CARE_PROVIDER_SITE_OTHER): Payer: Self-pay | Admitting: Orthopedic Surgery

## 2014-04-16 VITALS — BP 132/70 | Ht 64.0 in | Wt 242.0 lb

## 2014-04-16 DIAGNOSIS — S42031D Displaced fracture of lateral end of right clavicle, subsequent encounter for fracture with routine healing: Secondary | ICD-10-CM

## 2014-04-16 NOTE — Patient Instructions (Signed)
Continue therapy

## 2014-04-17 ENCOUNTER — Encounter: Payer: Self-pay | Admitting: Orthopedic Surgery

## 2014-04-17 ENCOUNTER — Ambulatory Visit (HOSPITAL_COMMUNITY)
Admission: RE | Admit: 2014-04-17 | Discharge: 2014-04-17 | Disposition: A | Discharge status: Home / Self Care | Payer: PRIVATE HEALTH INSURANCE | Source: Ambulatory Visit | Attending: Family Medicine | Admitting: Family Medicine

## 2014-04-17 DIAGNOSIS — S42033A Displaced fracture of lateral end of unspecified clavicle, initial encounter for closed fracture: Secondary | ICD-10-CM | POA: Insufficient documentation

## 2014-04-17 DIAGNOSIS — Z5189 Encounter for other specified aftercare: Secondary | ICD-10-CM | POA: Diagnosis not present

## 2014-04-17 NOTE — Progress Notes (Signed)
Chief Complaint  Patient presents with  . Follow-up    follow up Rt clavicle fx s/p therapy, DOI 02/23/14    BP 132/70  Ht 5' 4"  (1.626 m)  Wt 242 lb (109.77 kg)  BMI 41.52 kg/m2  Followup status post right distal clavicle fracture the patient has been in a sling for proximally 6 weeks she's doing well she still has some soreness and limitations of flexion beyond 90. She's followed or occupational therapy protocol and is doing well.  She is allowed to continue weaning herself from the sling continue her therapy exercises. She returned to work on Monday. She'll followup with me in a few weeks for reevaluation.

## 2014-04-17 NOTE — Progress Notes (Signed)
Occupational Therapy Treatment Patient Details  Name: Yolanda [name] MRN: [id number] Date of Birth: 1956/06/15  Today's Date: 04/17/2014 Time: 1194-1740 OT Time Calculation (min): 39 min Manual therapy [phone] 16' Therapeutic exercises [phone] 23'  Visit#: 10 of 24  Re-eval: 05/13/14    Authorization:    Authorization Time Period:    Authorization Visit#:   of    Subjective  S:  I am going back to work Monday.   Limitations: Progress as tolerated  Pain Assessment Currently in Pain?: No/denies Pain Score: 0-No pain  Precautions/Restrictions   progress as tolerated  Exercise/Treatments Supine Protraction: PROM;AAROM;10 reps Horizontal ABduction: PROM;AAROM;10 reps External Rotation: PROM;AAROM;10 reps Internal Rotation: PROM;AAROM;10 reps Flexion: PROM;AAROM;10 reps ABduction: PROM;AAROM;10 reps Seated Elevation: AROM;15 reps Standing Extension: Theraband;10 reps Theraband Level (Shoulder Extension): Level 2 (Red) Row: Theraband;10 reps Theraband Level (Shoulder Row): Level 2 (Red) Retraction: Theraband;10 reps Theraband Level (Shoulder Retraction): Level 2 (Red) Therapy Ball Flexion: 20 reps ABduction: 20 reps Right/Left: 5 reps    Manual Therapy Manual Therapy: Myofascial release Myofascial Release: Myofascial release (MFR) and manual stretching to RUE bicep, tricep, upper arm, anterior shoulder, posterior shoulder, upper trap, and scapular regions to decrease pain and tenderness and promote improved range of motion  Occupational Therapy Assessment and Plan OT Assessment and Plan Clinical Impression Statement: A:  Added AAROM in supine with good form this date.   OT Plan: P:  Attempt AAROM in seated.     Goals Short Term Goals Time to Complete Short Term Goals: 4 weeks Short Term Goal 1: Patient will be educated on HEP.  Short Term Goal 2: Patient will decrease fascial restrictions from max-mod amount.  Short Term Goal 3: Patient will increase PROM  to [facility] to increase ability to donn upper body clothing.   Short Term Goal 4: Patient will increase strength to 3/5 to increase ability to perform keyboarding tasks.   Short Term Goal 5: Patient will decrease pain to 4/10 during daily tasks.  Long Term Goals Time to Complete Long Term Goals: 8 weeks Long Term Goal 1: Patient will return to highest level of independence during all daily and work tasks. Long Term Goal 2: Patient will decrease fascial restrictions from mod-min amount or less.  Long Term Goal 3: Patient will increase AROM to WNL to increase ability to reach into overhead cabinets.  Long Term Goal 4: Patient will increase strength to 5/5 to increase ability to lift and carry items during household chores.  Long Term Goal 5: Patient will decrease pain to 2/10 or less during daily and work tasks.   Problem List Patient Active Problem List   Diagnosis Date Noted  . Closed fracture of distal clavicle 04/17/2014  . Muscle weakness (generalized) 03/26/2014  . Pain in joint, shoulder region 03/26/2014  . Decreased range of motion of right shoulder 03/26/2014  . Right clavicle fracture 03/19/2014  . Fatty liver 03/17/2014  . Rectal bleeding 03/17/2014  . Dyspepsia 03/11/2014  . Precordial pain 09/11/2012  . Obesity, morbid 09/11/2012  . Hypothyroidism 09/11/2012  . ADHD (attention deficit hyperactivity disorder) 09/11/2012  . Anxiety 09/11/2012  . GERD 12/21/2009  . LIVER FUNCTION TESTS, ABNORMAL, HX OF 04/29/2009  . IRRITABLE BOWEL SYNDROME 04/22/2009  . DYSPHAGIA UNSPECIFIED 04/22/2009  . ABDOMINAL PAIN RIGHT UPPER QUADRANT 04/22/2009  . ABDOMINAL PAIN RIGHT LOWER QUADRANT 04/22/2009  . LIPOMA OF OTHER SKIN AND SUBCUTANEOUS TISSUE 07/09/2008  . HYPERLIPIDEMIA 07/09/2008  . DEPRESSION 07/09/2008  . HYPERTENSION 07/09/2008  . PE 07/09/2008  .  BRONCHITIS 07/09/2008  . GASTRITIS, CHRONIC 07/09/2008  . CALCULUS OF KIDNEY 07/09/2008  . SLEEP APNEA 07/09/2008  . ABDOMINAL  PAIN, CHRONIC 07/09/2008    End of Session Activity Tolerance: Patient tolerated treatment well General Behavior During Therapy: [facility] for tasks assessed/performed OT Plan of Care OT Home Exercise Plan: AAROM in supine. OT Patient Instructions: explained and demonstrated. provided handout (scanned)  Consulted and Agree with Plan of Care: Patient  [place], OTR/L [phone]  04/17/2014, 9:34 AM

## 2014-04-20 ENCOUNTER — Ambulatory Visit (HOSPITAL_COMMUNITY)
Admission: RE | Admit: 2014-04-20 | Discharge: 2014-04-20 | Disposition: A | Discharge status: Home / Self Care | Payer: PRIVATE HEALTH INSURANCE | Source: Ambulatory Visit | Attending: Family Medicine | Admitting: Family Medicine

## 2014-04-20 ENCOUNTER — Telehealth: Payer: Self-pay | Admitting: Orthopedic Surgery

## 2014-04-20 DIAGNOSIS — Z5189 Encounter for other specified aftercare: Secondary | ICD-10-CM | POA: Diagnosis not present

## 2014-04-20 NOTE — Progress Notes (Signed)
Note reviewed by clinical instructor and accurately reflects treatment session.  Ailene Ravel, OTR/L,CBIS

## 2014-04-20 NOTE — Telephone Encounter (Signed)
Notes, date of service 04/16/14, faxed to [employer], patient's short-term disability insurer; authorization on file.  Fax# [phone].

## 2014-04-20 NOTE — Progress Notes (Signed)
Occupational Therapy Treatment Patient Details  Name: Yolanda [name] MRN: [id number] Date of Birth: 12-Jun-1956  Today's Date: 04/20/2014 Time: 7893-8101 OT Time Calculation (min): 38 min MFR: 7510-2585 13' Therex: 2778-2423 25'  Visit#: 11 of 24  Re-eval: 05/13/14    Subjective Symptoms/Limitations Symptoms: S: I can do a lot more with it, I can lift my hand over my head now.  Pain Assessment Currently in Pain?: Yes Pain Score: 1  Pain Location: Shoulder Pain Orientation: Right Pain Type: Acute pain  Precautions/Restrictions  Precautions Precautions: Shoulder Type of Shoulder Precautions: PROM, AAROM until follow-up appt (04/16/2014)  Exercise/Treatments Supine Protraction: PROM;AAROM;10 reps Horizontal ABduction: PROM;AAROM;10 reps External Rotation: PROM;AAROM;10 reps Internal Rotation: PROM;AAROM;10 reps Flexion: PROM;AAROM;10 reps ABduction: PROM;AAROM;10 reps Seated Elevation: AROM;15 reps Extension: AROM;15 reps Protraction: AAROM;10 reps Horizontal ABduction: AAROM;10 reps External Rotation: AAROM;10 reps Internal Rotation: AAROM;10 reps Flexion: AAROM;10 reps Abduction: AAROM;10 reps   Therapy Ball Flexion: 20 reps ABduction: 20 reps Right/Left: 5 reps ROM / Strengthening / Isometric Strengthening Thumb Tacks: 1' Prot/Ret//Elev/Dep: 1'      Manual Therapy Manual Therapy: Myofascial release Myofascial Release: Myofascial release (MFR) and manual stretching to RUE bicep, tricep, upper arm, anterior shoulder, posterior shoulder, upper trap, and scapular regions to decrease pain and tenderness and promote improved range of motion  Occupational Therapy Assessment and Plan OT Assessment and Plan Clinical Impression Statement: A: Added AAROM in sitting. Patient tolerated well. Patient reported tingling/numb sensation towards end of session. Patient reports she went to doctor on Thursday receiving a good report and is returning to work today.  OT  Plan: P: Add wall wash. Resume theraband exercises in standing.    Goals Short Term Goals Time to Complete Short Term Goals: 4 weeks Short Term Goal 1: Patient will be educated on HEP.  Short Term Goal 2: Patient will decrease fascial restrictions from max-mod amount.  Short Term Goal 3: Patient will increase PROM to [facility] to increase ability to donn upper body clothing.   Short Term Goal 3 Progress: Progressing toward goal Short Term Goal 4: Patient will increase strength to 3/5 to increase ability to perform keyboarding tasks.   Short Term Goal 4 Progress: Progressing toward goal Short Term Goal 5: Patient will decrease pain to 4/10 during daily tasks.  Long Term Goals Time to Complete Long Term Goals: 8 weeks Long Term Goal 1: Patient will return to highest level of independence during all daily and work tasks. Long Term Goal 1 Progress: Progressing toward goal Long Term Goal 2: Patient will decrease fascial restrictions from mod-min amount or less.  Long Term Goal 2 Progress: Progressing toward goal Long Term Goal 3: Patient will increase AROM to WNL to increase ability to reach into overhead cabinets.  Long Term Goal 3 Progress: Progressing toward goal Long Term Goal 4: Patient will increase strength to 5/5 to increase ability to lift and carry items during household chores.  Long Term Goal 4 Progress: Progressing toward goal Long Term Goal 5: Patient will decrease pain to 2/10 or less during daily and work tasks.  Long Term Goal 5 Progress: Progressing toward goal  Problem List Patient Active Problem List   Diagnosis Date Noted  . Closed fracture of distal clavicle 04/17/2014  . Muscle weakness (generalized) 03/26/2014  . Pain in joint, shoulder region 03/26/2014  . Decreased range of motion of right shoulder 03/26/2014  . Right clavicle fracture 03/19/2014  . Fatty liver 03/17/2014  . Rectal bleeding 03/17/2014  . Dyspepsia 03/11/2014  .  Precordial pain 09/11/2012  .  Obesity, morbid 09/11/2012  . Hypothyroidism 09/11/2012  . ADHD (attention deficit hyperactivity disorder) 09/11/2012  . Anxiety 09/11/2012  . GERD 12/21/2009  . LIVER FUNCTION TESTS, ABNORMAL, HX OF 04/29/2009  . IRRITABLE BOWEL SYNDROME 04/22/2009  . DYSPHAGIA UNSPECIFIED 04/22/2009  . ABDOMINAL PAIN RIGHT UPPER QUADRANT 04/22/2009  . ABDOMINAL PAIN RIGHT LOWER QUADRANT 04/22/2009  . LIPOMA OF OTHER SKIN AND SUBCUTANEOUS TISSUE 07/09/2008  . HYPERLIPIDEMIA 07/09/2008  . DEPRESSION 07/09/2008  . HYPERTENSION 07/09/2008  . PE 07/09/2008  . BRONCHITIS 07/09/2008  . GASTRITIS, CHRONIC 07/09/2008  . CALCULUS OF KIDNEY 07/09/2008  . SLEEP APNEA 07/09/2008  . ABDOMINAL PAIN, CHRONIC 07/09/2008    End of Session Activity Tolerance: Patient tolerated treatment well General Behavior During Therapy: [facility] for tasks assessed/performed   Guadelupe Sabin. OT Student  04/20/2014, 10:11 AM

## 2014-04-22 ENCOUNTER — Ambulatory Visit (HOSPITAL_COMMUNITY)
Admission: RE | Admit: 2014-04-22 | Discharge: 2014-04-22 | Disposition: A | Discharge status: Home / Self Care | Payer: PRIVATE HEALTH INSURANCE | Source: Ambulatory Visit | Attending: Family Medicine | Admitting: Family Medicine

## 2014-04-22 DIAGNOSIS — Z5189 Encounter for other specified aftercare: Secondary | ICD-10-CM | POA: Diagnosis not present

## 2014-04-22 NOTE — Progress Notes (Signed)
Occupational Therapy Treatment Patient Details  Name: Yolanda [name] MRN: [id number] Date of Birth: Aug 18, 1955  Today's Date: 04/22/2014 Time: 0630-1601 OT Time Calculation (min): 38 min Manual therapy 093-235 20' Therapeutic exercises 573-220 18'  Visit#: 12 of 24  Re-eval: 05/13/14     Subjective  S:  I went back to work Monday, and it has been really sore.  I can do more with it, but the muscle soreness is getting me.  Limitations: Progress as tolerated  Pain Assessment Currently in Pain?: Yes Pain Score: 3  Pain Location: Arm Pain Orientation: Right Pain Type: Acute pain Pain Radiating Towards: shoulder to upper arm Pain Onset: In the past 7 days  Precautions/Restrictions   progress as tolerated  Exercise/Treatments Supine Protraction: PROM;10 reps;AAROM;15 reps Horizontal ABduction: PROM;10 reps;AAROM;15 reps External Rotation: PROM;10 reps;AAROM;15 reps Internal Rotation: PROM;10 reps;AAROM;15 reps Flexion: PROM;10 reps;AAROM;15 reps ABduction: PROM;10 reps;AAROM;15 reps Seated Protraction: AAROM;15 reps Horizontal ABduction: AAROM;15 reps External Rotation: AAROM;15 reps Internal Rotation: AAROM;15 reps Flexion: AAROM;15 reps Abduction: AAROM;15 reps Standing Extension: Theraband;15 reps Theraband Level (Shoulder Extension): Level 2 (Red) Row: Theraband;15 reps Theraband Level (Shoulder Row): Level 2 (Red) Retraction: Theraband;15 reps Theraband Level (Shoulder Retraction): Level 2 (Red) ROM / Strengthening / Isometric Strengthening Wall Wash: 1'      Manual Therapy Manual Therapy: Myofascial release Myofascial Release: Myofascial release (MFR) and manual stretching to RUE bicep, tricep, upper arm, anterior shoulder, posterior shoulder, upper trap, and scapular regions to decrease pain and tenderness and promote improved range of motion  Occupational Therapy Assessment and Plan OT Assessment and Plan Clinical Impression Statement: A:   Increased to 15 repetitions with AAROM in supine and seated.  Added wall wash, patient felt good results from wall wash.  Increased to 15 repetitions with theraband scapular stability exercises.  OT Plan: P:  Decrease to 2 times per week vs 3 times per week per patient request.  Attempt AROM in supine.   Goals Short Term Goals Time to Complete Short Term Goals: 4 weeks Short Term Goal 1: Patient will be educated on HEP.  Short Term Goal 2: Patient will decrease fascial restrictions from max-mod amount.  Short Term Goal 3: Patient will increase PROM to [facility] to increase ability to donn upper body clothing.   Short Term Goal 4: Patient will increase strength to 3/5 to increase ability to perform keyboarding tasks.   Short Term Goal 5: Patient will decrease pain to 4/10 during daily tasks.  Long Term Goals Time to Complete Long Term Goals: 8 weeks Long Term Goal 1: Patient will return to highest level of independence during all daily and work tasks. Long Term Goal 2: Patient will decrease fascial restrictions from mod-min amount or less.  Long Term Goal 3: Patient will increase AROM to WNL to increase ability to reach into overhead cabinets.  Long Term Goal 4: Patient will increase strength to 5/5 to increase ability to lift and carry items during household chores.  Long Term Goal 5: Patient will decrease pain to 2/10 or less during daily and work tasks.   Problem List Patient Active Problem List   Diagnosis Date Noted  . Closed fracture of distal clavicle 04/17/2014  . Muscle weakness (generalized) 03/26/2014  . Pain in joint, shoulder region 03/26/2014  . Decreased range of motion of right shoulder 03/26/2014  . Right clavicle fracture 03/19/2014  . Fatty liver 03/17/2014  . Rectal bleeding 03/17/2014  . Dyspepsia 03/11/2014  . Precordial pain 09/11/2012  . Obesity, morbid  09/11/2012  . Hypothyroidism 09/11/2012  . ADHD (attention deficit hyperactivity disorder) 09/11/2012  . Anxiety  09/11/2012  . GERD 12/21/2009  . LIVER FUNCTION TESTS, ABNORMAL, HX OF 04/29/2009  . IRRITABLE BOWEL SYNDROME 04/22/2009  . DYSPHAGIA UNSPECIFIED 04/22/2009  . ABDOMINAL PAIN RIGHT UPPER QUADRANT 04/22/2009  . ABDOMINAL PAIN RIGHT LOWER QUADRANT 04/22/2009  . LIPOMA OF OTHER SKIN AND SUBCUTANEOUS TISSUE 07/09/2008  . HYPERLIPIDEMIA 07/09/2008  . DEPRESSION 07/09/2008  . HYPERTENSION 07/09/2008  . PE 07/09/2008  . BRONCHITIS 07/09/2008  . GASTRITIS, CHRONIC 07/09/2008  . CALCULUS OF KIDNEY 07/09/2008  . SLEEP APNEA 07/09/2008  . ABDOMINAL PAIN, CHRONIC 07/09/2008    End of Session Activity Tolerance: Patient tolerated treatment well General Behavior During Therapy: [facility] for tasks assessed/performed  GO    Vangie Bicker, OTR/L [phone]  04/22/2014, 9:35 AM

## 2014-04-24 ENCOUNTER — Ambulatory Visit (HOSPITAL_COMMUNITY): Payer: Self-pay

## 2014-04-27 ENCOUNTER — Ambulatory Visit (HOSPITAL_COMMUNITY): Payer: Self-pay

## 2014-04-27 ENCOUNTER — Ambulatory Visit (HOSPITAL_COMMUNITY)
Admission: RE | Admit: 2014-04-27 | Discharge: 2014-04-27 | Disposition: A | Discharge status: Home / Self Care | Payer: PRIVATE HEALTH INSURANCE | Source: Ambulatory Visit | Attending: Family Medicine | Admitting: Family Medicine

## 2014-04-27 DIAGNOSIS — Z5189 Encounter for other specified aftercare: Secondary | ICD-10-CM | POA: Diagnosis not present

## 2014-04-27 NOTE — Progress Notes (Signed)
Occupational Therapy Treatment Patient Details  Name: Yolanda [name] MRN: [id number] Date of Birth: 10-01-1955  Today's Date: 04/27/2014 Time: 3235-5732 OT Time Calculation (min): 35 min MFR: 2025-4270 12' Therex: 6237-6283 23'   Visit#: 13 of 24  Re-eval: 05/13/14   Subjective Symptoms/Limitations Symptoms: S: It does ok except when I drive and try to reach behind my back.  Pain Assessment Currently in Pain?: Yes Pain Score: 1  Pain Location: Shoulder Pain Orientation: Right Pain Type: Acute pain  Precautions/Restrictions  Precautions Precautions: Shoulder Type of Shoulder Precautions: PROM, AAROM until follow-up appt (04/16/2014)  Exercise/Treatments Supine Protraction: PROM;5 reps;AROM;10 reps Horizontal ABduction: PROM;5 reps;AROM;10 reps External Rotation: PROM;5 reps;AROM;10 reps Internal Rotation: PROM;5 reps;AROM;10 reps Flexion: PROM;5 reps;AROM;10 reps ABduction: PROM;5 reps;AROM;10 reps   Standing Protraction: AAROM;15 reps Horizontal ABduction: AAROM;15 reps External Rotation: AAROM;15 reps Internal Rotation: AAROM;15 reps Flexion: AAROM;15 reps ABduction: AAROM;15 reps Extension: Theraband;15 reps Theraband Level (Shoulder Extension): Level 2 (Red) Row: Theraband;15 reps Theraband Level (Shoulder Row): Level 2 (Red) Retraction: Theraband;15 reps Theraband Level (Shoulder Retraction): Level 2 (Red)   ROM / Strengthening / Isometric Strengthening Wall Wash: 1' 30"  Prot/Ret//Elev/Dep: 1'        Manual Therapy Manual Therapy: Myofascial release Myofascial Release: Myofascial release (MFR) and manual stretching to RUE bicep, tricep, upper arm, anterior shoulder, posterior shoulder, upper trap, and scapular regions to decrease pain and tenderness and promote improved range of motion  Occupational Therapy Assessment and Plan OT Assessment and Plan Clinical Impression Statement: A: Added AROM exercises in supine. Increased wall wash to 1' 30" .  Patient tolerated treatment well. Patient reports work is going well, although it is difficult to use a stapler and she experiences some pain during work activities. Patient reports driving is painful.  OT Plan: P: Add AROM in standing. Follow up on continuing therapy versus stopping. Patient needs to schedule more appointments if continuing. Update HEP for AROM exercises.    Goals Short Term Goals Time to Complete Short Term Goals: 4 weeks Short Term Goal 1: Patient will be educated on HEP.  Short Term Goal 2: Patient will decrease fascial restrictions from max-mod amount.  Short Term Goal 3: Patient will increase PROM to [facility] to increase ability to donn upper body clothing.   Short Term Goal 3 Progress: Progressing toward goal Short Term Goal 4: Patient will increase strength to 3/5 to increase ability to perform keyboarding tasks.   Short Term Goal 4 Progress: Progressing toward goal Short Term Goal 5: Patient will decrease pain to 4/10 during daily tasks.  Long Term Goals Time to Complete Long Term Goals: 8 weeks Long Term Goal 1: Patient will return to highest level of independence during all daily and work tasks. Long Term Goal 1 Progress: Progressing toward goal Long Term Goal 2: Patient will decrease fascial restrictions from mod-min amount or less.  Long Term Goal 2 Progress: Progressing toward goal Long Term Goal 3: Patient will increase AROM to WNL to increase ability to reach into overhead cabinets.  Long Term Goal 3 Progress: Progressing toward goal Long Term Goal 4: Patient will increase strength to 5/5 to increase ability to lift and carry items during household chores.  Long Term Goal 4 Progress: Progressing toward goal Long Term Goal 5: Patient will decrease pain to 2/10 or less during daily and work tasks.  Long Term Goal 5 Progress: Progressing toward goal  Problem List Patient Active Problem List   Diagnosis Date Noted  . Closed fracture of distal clavicle  04/17/2014   . Muscle weakness (generalized) 03/26/2014  . Pain in joint, shoulder region 03/26/2014  . Decreased range of motion of right shoulder 03/26/2014  . Right clavicle fracture 03/19/2014  . Fatty liver 03/17/2014  . Rectal bleeding 03/17/2014  . Dyspepsia 03/11/2014  . Precordial pain 09/11/2012  . Obesity, morbid 09/11/2012  . Hypothyroidism 09/11/2012  . ADHD (attention deficit hyperactivity disorder) 09/11/2012  . Anxiety 09/11/2012  . GERD 12/21/2009  . LIVER FUNCTION TESTS, ABNORMAL, HX OF 04/29/2009  . IRRITABLE BOWEL SYNDROME 04/22/2009  . DYSPHAGIA UNSPECIFIED 04/22/2009  . ABDOMINAL PAIN RIGHT UPPER QUADRANT 04/22/2009  . ABDOMINAL PAIN RIGHT LOWER QUADRANT 04/22/2009  . LIPOMA OF OTHER SKIN AND SUBCUTANEOUS TISSUE 07/09/2008  . HYPERLIPIDEMIA 07/09/2008  . DEPRESSION 07/09/2008  . HYPERTENSION 07/09/2008  . PE 07/09/2008  . BRONCHITIS 07/09/2008  . GASTRITIS, CHRONIC 07/09/2008  . CALCULUS OF KIDNEY 07/09/2008  . SLEEP APNEA 07/09/2008  . ABDOMINAL PAIN, CHRONIC 07/09/2008    End of Session Activity Tolerance: Patient tolerated treatment well General Behavior During Therapy: [facility] for tasks assessed/performed   Guadelupe Sabin. OT Student  04/27/2014, 1:50 PM

## 2014-04-27 NOTE — Progress Notes (Signed)
Note reviewed by clinical instructor and accurately reflects treatment session.  Ailene Ravel, OTR/L,CBIS

## 2014-04-28 ENCOUNTER — Telehealth: Payer: Self-pay | Admitting: Gastroenterology

## 2014-04-28 NOTE — Telephone Encounter (Signed)
APPT NIC'D

## 2014-04-28 NOTE — Telephone Encounter (Signed)
LMOM to call.

## 2014-04-28 NOTE — Telephone Encounter (Signed)
Please call pt. She had simple adenomas removed. HER stomach Bx shows gastritis.    CONTINUE PROTONIX. TAKE 30 MINUTES PRIOR TO BREAKFAST.  AVOID ITEMS THAT TRIGGER GASTRITIS.   FOLLOW A HIGH FIBER/LOW FAT DIET. AVOID ITEMS THAT CAUSE BLOATING.   OPV E30 GASTRITIS/DUODENITIS/ABDOMINAL PAIN IN JAN 2016.  Next colonoscopy in 5-10 years.

## 2014-04-28 NOTE — Telephone Encounter (Signed)
Pt aware of results 

## 2014-04-29 ENCOUNTER — Ambulatory Visit (HOSPITAL_COMMUNITY)
Admission: RE | Admit: 2014-04-29 | Discharge: 2014-04-29 | Disposition: A | Discharge status: Home / Self Care | Payer: PRIVATE HEALTH INSURANCE | Source: Ambulatory Visit

## 2014-04-29 DIAGNOSIS — Z5189 Encounter for other specified aftercare: Secondary | ICD-10-CM | POA: Diagnosis not present

## 2014-04-29 NOTE — Progress Notes (Signed)
Occupational Therapy Treatment Patient Details  Name: Yolanda [name] MRN: [id number] Date of Birth: October 30, 1955  Today's Date: 04/29/2014 Time: 0109-3235 OT Time Calculation (min): 38 min Manual 5732-2025 (15') Therapeutic Exercises 1324-1347 (23')  Visit#: 14 of 24  Re-eval: 05/13/14    Authorization:    Authorization Time Period:    Authorization Visit#:   of    Subjective Symptoms/Limitations Symptoms: "work is going ok - there are a few things i have a hard time doing. And my arm gets very tired.  I can't use a stapler, so they've got an electric one." Pain Assessment Currently in Pain?: No/denies  Exercise/Treatments Supine Protraction: PROM;5 reps;AROM;12 reps Horizontal ABduction: PROM;5 reps;AROM;12 reps External Rotation: PROM;5 reps;AROM;12 reps Internal Rotation: PROM;5 reps;AROM;12 reps Flexion: PROM;5 reps;AROM;12 reps ABduction: PROM;5 reps;AROM;12 reps (sharp pain in upper arm) Standing Protraction: AROM;10 reps Horizontal ABduction: AROM;10 reps External Rotation: AROM;10 reps Internal Rotation: AROM;10 reps Flexion: AROM;10 reps ABduction: AROM;10 reps    Manual Therapy Manual Therapy: Myofascial release Myofascial Release: Myofascial release (MFR) and manual stretching to RUE bicep, tricep, upper arm, anterior shoulder, posterior shoulder, upper trap, and scapular regions to decrease pain and tenderness and promote improved range of motion.    Occupational Therapy Assessment and Plan OT Assessment and Plan Clinical Impression Statement: Pt in agreement to continue therapy 2x per week for an additional 2 weeks, and to discuss status afterwards. Educated pt on importance of continuing strengthening activites, especially as pt verbalizes becoming fatigued easily at work. Increased supine AROM reps and added standing AROM this session. Pt with increased pain in supine abduction, but was able to tolerate after rest break. Pt fatigued during all AROM, but  tolerated.   Added AROM to HEP.   OT Plan: Follow up on addition of AROM to HEP.  Increase AROM as tolerated.   Goals Short Term Goals Short Term Goal 1: Patient will be educated on HEP.  Short Term Goal 1 Progress: Met Short Term Goal 2: Patient will decrease fascial restrictions from max-mod amount.  Short Term Goal 2 Progress: Met Short Term Goal 3: Patient will increase PROM to [facility] to increase ability to donn upper body clothing.   Short Term Goal 3 Progress: Progressing toward goal Short Term Goal 4: Patient will increase strength to 3/5 to increase ability to perform keyboarding tasks.   Short Term Goal 4 Progress: Progressing toward goal Short Term Goal 5: Patient will decrease pain to 4/10 during daily tasks.  Short Term Goal 5 Progress: Met Long Term Goals Long Term Goal 1: Patient will return to highest level of independence during all daily and work tasks. Long Term Goal 1 Progress: Progressing toward goal Long Term Goal 2: Patient will decrease fascial restrictions from mod-min amount or less.  Long Term Goal 2 Progress: Progressing toward goal Long Term Goal 3: Patient will increase AROM to WNL to increase ability to reach into overhead cabinets.  Long Term Goal 3 Progress: Progressing toward goal Long Term Goal 4: Patient will increase strength to 5/5 to increase ability to lift and carry items during household chores.  Long Term Goal 4 Progress: Progressing toward goal Long Term Goal 5: Patient will decrease pain to 2/10 or less during daily and work tasks.  Long Term Goal 5 Progress: Progressing toward goal  Problem List Patient Active Problem List   Diagnosis Date Noted  . Closed fracture of distal clavicle 04/17/2014  . Muscle weakness (generalized) 03/26/2014  . Pain in joint, shoulder region 03/26/2014  .  Decreased range of motion of right shoulder 03/26/2014  . Right clavicle fracture 03/19/2014  . Fatty liver 03/17/2014  . Rectal bleeding 03/17/2014  .  Dyspepsia 03/11/2014  . Precordial pain 09/11/2012  . Obesity, morbid 09/11/2012  . Hypothyroidism 09/11/2012  . ADHD (attention deficit hyperactivity disorder) 09/11/2012  . Anxiety 09/11/2012  . GERD 12/21/2009  . LIVER FUNCTION TESTS, ABNORMAL, HX OF 04/29/2009  . IRRITABLE BOWEL SYNDROME 04/22/2009  . DYSPHAGIA UNSPECIFIED 04/22/2009  . ABDOMINAL PAIN RIGHT UPPER QUADRANT 04/22/2009  . ABDOMINAL PAIN RIGHT LOWER QUADRANT 04/22/2009  . LIPOMA OF OTHER SKIN AND SUBCUTANEOUS TISSUE 07/09/2008  . HYPERLIPIDEMIA 07/09/2008  . DEPRESSION 07/09/2008  . HYPERTENSION 07/09/2008  . PE 07/09/2008  . BRONCHITIS 07/09/2008  . GASTRITIS, CHRONIC 07/09/2008  . CALCULUS OF KIDNEY 07/09/2008  . SLEEP APNEA 07/09/2008  . ABDOMINAL PAIN, CHRONIC 07/09/2008    End of Session Activity Tolerance: Patient tolerated treatment well General Behavior During Therapy: [facility] for tasks assessed/performed OT Plan of Care OT Home Exercise Plan: AAROM in supine.      10/21 added AROM OT Patient Instructions: explained and demonstrated. provided handout (scanned)  Consulted and Agree with Plan of Care: Patient  [place] Liba Hulsey, [place], OTR/L [place] [phone] 04/29/2014, 4:18 PM

## 2014-05-01 ENCOUNTER — Telehealth (HOSPITAL_COMMUNITY): Payer: Self-pay

## 2014-05-01 NOTE — Telephone Encounter (Signed)
cx all future appointments.  Said dr only wanted her to do therapy for a month and she has done that and is ready to be done

## 2014-05-01 NOTE — Progress Notes (Signed)
  Patient Details  Name: Yolanda [name] MRN: [id number] Date of Birth: 20-Jul-1955  Today's Date: 05/01/2014   Per pt request, pt is discharged from OT at this time. Pt states she no longer requires therapy services.   Bea Graff Dennice Tindol, MS, OTR/L [facility] [phone] 05/01/2014, 11:59 AM

## 2014-05-04 ENCOUNTER — Ambulatory Visit (HOSPITAL_COMMUNITY): Payer: PRIVATE HEALTH INSURANCE | Admitting: Specialist

## 2014-05-07 ENCOUNTER — Ambulatory Visit (INDEPENDENT_AMBULATORY_CARE_PROVIDER_SITE_OTHER): Payer: Self-pay | Admitting: Orthopedic Surgery

## 2014-05-07 ENCOUNTER — Encounter: Payer: Self-pay | Admitting: Orthopedic Surgery

## 2014-05-07 VITALS — BP 144/88 | Ht 64.0 in | Wt 242.0 lb

## 2014-05-07 DIAGNOSIS — S42031D Displaced fracture of lateral end of right clavicle, subsequent encounter for fracture with routine healing: Secondary | ICD-10-CM

## 2014-05-07 NOTE — Progress Notes (Signed)
Patient ID: Yolanda [name], female   DOB: July 28, 1955, 58 y.o.   MRN: [id number]  Chief Complaint  Patient presents with  . Follow-up    3 week recheck Rt clavicle, DOI 02/23/14   Follow-up visit status post right distal clavicle fracture treated with immobilization. She is doing well except for some discomfort when she goes to sleep. She is return to work full activity doing well without complications. She has full [place] elevation of the right shoulder with no weakness  She is released follow-up as needed.

## 2014-05-08 ENCOUNTER — Ambulatory Visit (HOSPITAL_COMMUNITY): Payer: Self-pay

## 2014-05-11 ENCOUNTER — Ambulatory Visit (HOSPITAL_COMMUNITY): Payer: Self-pay

## 2014-05-14 ENCOUNTER — Ambulatory Visit (HOSPITAL_COMMUNITY): Payer: Self-pay

## 2014-05-28 ENCOUNTER — Ambulatory Visit (INDEPENDENT_AMBULATORY_CARE_PROVIDER_SITE_OTHER): Payer: PRIVATE HEALTH INSURANCE | Admitting: Gastroenterology

## 2014-05-28 ENCOUNTER — Encounter: Payer: Self-pay | Admitting: Gastroenterology

## 2014-05-28 VITALS — BP 161/91 | HR 91 | Temp 98.5°F | Ht 64.0 in | Wt 252.0 lb

## 2014-05-28 DIAGNOSIS — K2970 Gastritis, unspecified, without bleeding: Secondary | ICD-10-CM

## 2014-05-28 DIAGNOSIS — K2990 Gastroduodenitis, unspecified, without bleeding: Secondary | ICD-10-CM

## 2014-05-28 DIAGNOSIS — K769 Liver disease, unspecified: Secondary | ICD-10-CM

## 2014-05-28 DIAGNOSIS — K760 Fatty (change of) liver, not elsewhere classified: Secondary | ICD-10-CM

## 2014-05-28 DIAGNOSIS — E039 Hypothyroidism, unspecified: Secondary | ICD-10-CM

## 2014-05-28 DIAGNOSIS — K589 Irritable bowel syndrome without diarrhea: Secondary | ICD-10-CM

## 2014-05-28 NOTE — Progress Notes (Signed)
Primary Care Physician: Purvis Kilts, MD  Primary Gastroenterologist:  Barney Drain, MD   Chief Complaint  Patient presents with  . Dyspepsia    HPI: Yolanda [name] is a 58 y.o. female here for follow up. EGD and colonoscopy back in September 2015. She had hemorrhoids, 2 tubular adenomas removed. Gastritis/duodenitis. Since her last office visit she underwent an ultrasound with elastography for h/o fatty liver, which revealed mild hepatomegaly, fatty liver, Metavir F4. LFTs normal. HCV and Hep B surface Ag were negative. No prior Hep B vaccines. FH of Wilson's disease (2 nieces), one succumbed to disease in her 28s, the other with cirrhosis.  Broke clavical in 01/2014. Out of work 8 weeks. Was taking pain meds only. Has been off BP and thyroid meds. Has plan to discuss with PCP next month. BM 1-2 loose stools daily. No indigestion. Pain grabs in epigastrium. Now in LUQ as well. Gets this a lot. Unrelated to meals.  Up 25 pounds since last visit.  Patient only taking her calcium and vitamin D right now.  Current Outpatient Prescriptions  Medication Sig Dispense Refill  . CALCIUM PO Take 1 tablet by mouth daily.    . Cholecalciferol (VITAMIN D3) 2000 UNITS TABS Take 1 tablet by mouth daily.    Marland Kitchen ALPRAZolam (XANAX) 1 MG tablet Take 1 mg by mouth 3 (three) times daily as needed. Anxiety    . cycloSPORINE (RESTASIS) 0.05 % ophthalmic emulsion Place 1 drop into both eyes 2 (two) times daily.    Marland Kitchen docusate sodium (COLACE) 100 MG capsule Take 100 mg by mouth at bedtime.    . fish oil-omega-3 fatty acids 1000 MG capsule Take 1 g by mouth at bedtime.     . fluticasone (FLONASE) 50 MCG/ACT nasal spray Place 1-2 sprays into both nostrils daily.    Marland Kitchen ibuprofen (ADVIL,MOTRIN) 200 MG tablet Take 200 mg by mouth as needed for mild pain.     Marland Kitchen levothyroxine (SYNTHROID, LEVOTHROID) 75 MCG tablet Take 75 mcg by mouth daily before breakfast.    . MAGNESIUM PO Take 1 tablet by mouth at  bedtime.     . meclizine (ANTIVERT) 25 MG tablet Take 25 mg by mouth 3 (three) times daily as needed. Dizziness    . methylphenidate (CONCERTA) 36 MG CR tablet Take 36 mg by mouth daily.    . Milnacipran HCl (SAVELLA) 100 MG TABS tablet Take 100 mg by mouth 2 (two) times daily.    . ondansetron (ZOFRAN ODT) 4 MG disintegrating tablet Take 1 tablet (4 mg total) by mouth every 8 (eight) hours as needed for nausea or vomiting. 20 tablet 0  . oxyCODONE-acetaminophen (PERCOCET/ROXICET) 5-325 MG per tablet Take 1 tablet by mouth every 4 (four) hours as needed. 84 tablet 0  . pantoprazole (PROTONIX) 40 MG tablet Take 1 tablet (40 mg total) by mouth daily. 30 tablet 0  . telmisartan (MICARDIS) 40 MG tablet Take 40 mg by mouth daily.    . traMADol (ULTRAM) 50 MG tablet Take 50-100 mg by mouth 3 (three) times daily.     No current facility-administered medications for this visit.    Allergies as of 05/28/2014 - Review Complete 05/28/2014  Allergen Reaction Noted  . Sulfonamide derivatives Other (See Comments)        ROS:  General: Negative for anorexia, weight loss, fever, chills, fatigue, weakness. ENT: Negative for hoarseness, difficulty swallowing , nasal congestion. CV: Negative for chest pain, angina, palpitations, dyspnea on exertion,  peripheral edema.  Respiratory: Negative for dyspnea at rest, dyspnea on exertion, cough, sputum, wheezing.  GI: See history of present illness. GU:  Negative for dysuria, hematuria, urinary incontinence, urinary frequency, nocturnal urination.  Endo: Negative for unusual weight change.    Physical Examination:   BP 161/91 mmHg  Pulse 91  Temp(Src) 98.5 F (36.9 C) (Oral)  Ht 5' 4"  (1.626 m)  Wt 252 lb (114.306 kg)  BMI 43.23 kg/m2  General: Well-nourished, well-developed in no acute distress.  Eyes: No icterus. Mouth: Oropharyngeal mucosa moist and pink , no lesions erythema or exudate. Lungs: Clear to auscultation bilaterally.  Heart: Regular  rate and rhythm, no murmurs rubs or gallops.  Abdomen: Bowel sounds are normal, nontender, nondistended, no hepatosplenomegaly or masses, no abdominal bruits or hernia , no rebound or guarding.   Extremities: No lower extremity edema. No clubbing or deformities. Neuro: Alert and oriented x 4   Skin: Warm and dry, no jaundice.   Psych: Alert and cooperative, normal mood and affect.  Labs:  Lab Results  Component Value Date   ALT 21 03/11/2014   AST 22 03/11/2014   ALKPHOS 101 03/11/2014   BILITOT 0.4 03/11/2014   Lab Results  Component Value Date   CREATININE 0.61 01/29/2014   BUN 10 01/29/2014   NA 139 01/29/2014   K 4.0 01/29/2014   CL 101 01/29/2014   CO2 26 01/29/2014   Lab Results  Component Value Date   WBC 9.2 01/29/2014   HGB 15.5* 01/29/2014   HCT 45.3 01/29/2014   MCV 89.9 01/29/2014   PLT 276 01/29/2014    Imaging Studies: No results found.

## 2014-05-28 NOTE — Patient Instructions (Signed)
1. We will call you in the next 1-2 days and let you know about future blood work once I've discussed your fibrosis score with Dr. [name].

## 2014-05-31 NOTE — Assessment & Plan Note (Signed)
Recent Metavir F4 score and fatty liver on U/S with elastography. Family history signifianct for Wilson's disease (2 nieces-her brother's children). Her brother has not been testing. Plan to test for Wilson's disease. May consider Fibrotest to compare with recent elastography. No imaging consistent with cirrhosis. No endoscopic evidence of portal hypertension.   Instructions for fatty liver: Recommend 1-2# weight loss per week until ideal body weight through exercise & diet. Low fat/cholesterol diet.   Avoid sweets, sodas, fruit juices, sweetened beverages like tea, etc. Gradually increase exercise from 15 min daily up to 1 hr per day 5 days/week. Limit alcohol use.  She needs to get back on her meds and has upcoming appointment with her PCP. We will recheck her TSH at this time.

## 2014-05-31 NOTE — Assessment & Plan Note (Signed)
Stable at this time 

## 2014-05-31 NOTE — Assessment & Plan Note (Signed)
Mild epigastric/LUQ discomfort. Restart pantoprazole once daily for gastritis.

## 2014-05-31 NOTE — Progress Notes (Signed)
Please order a Promethius Fibrosure Test.

## 2014-06-01 NOTE — Addendum Note (Signed)
Addended by: Mahala Menghini on: 06/01/2014 08:36 AM   Modules accepted: Orders

## 2014-06-02 NOTE — Progress Notes (Signed)
Pt is aware. Lab orders have been faxed to [facility].

## 2014-06-03 NOTE — Progress Notes (Signed)
cc'ed to pcp °

## 2014-06-11 LAB — TSH: TSH: 4.22 u[IU]/mL (ref 0.350–4.500)

## 2014-06-11 LAB — PROTIME-INR
INR: 1.03 (ref <1.50)
Prothrombin Time: 13.5 seconds (ref 11.6–15.2)

## 2014-06-11 LAB — IRON AND TIBC
%SAT: 44 % (ref 20–55)
IRON: 120 ug/dL (ref 42–145)
TIBC: 272 ug/dL (ref 250–470)
UIBC: 152 ug/dL (ref 125–400)

## 2014-06-11 LAB — FERRITIN: Ferritin: 166 ng/mL (ref 10–291)

## 2014-06-11 LAB — HEPATITIS B SURFACE ANTIBODY,QUALITATIVE: Hep B S Ab: NEGATIVE

## 2014-06-11 LAB — HEPATITIS A ANTIBODY, TOTAL: Hep A Total Ab: REACTIVE — AB

## 2014-06-12 LAB — CERULOPLASMIN: Ceruloplasmin: 23 mg/dL (ref 18–53)

## 2014-06-24 NOTE — Progress Notes (Signed)
Quick Note:  Patient is immune to Hep A. Labs good. TSH normal.  Patient needs to complete 24 urine copper test. Patient needs Hep B vaccine, please arrange.  Has Fibrotest results come back yet. ______

## 2014-06-29 ENCOUNTER — Other Ambulatory Visit: Payer: Self-pay | Admitting: Gastroenterology

## 2014-06-29 DIAGNOSIS — K2970 Gastritis, unspecified, without bleeding: Secondary | ICD-10-CM

## 2014-06-29 DIAGNOSIS — K2990 Gastroduodenitis, unspecified, without bleeding: Principal | ICD-10-CM

## 2014-06-29 DIAGNOSIS — K589 Irritable bowel syndrome without diarrhea: Secondary | ICD-10-CM

## 2014-06-29 DIAGNOSIS — K769 Liver disease, unspecified: Secondary | ICD-10-CM

## 2014-06-29 DIAGNOSIS — K760 Fatty (change of) liver, not elsewhere classified: Secondary | ICD-10-CM

## 2014-06-29 NOTE — Progress Notes (Signed)
Quick Note:  Pt is aware of results. She will check with Dr. Hilma Favors about getter the Hep B vaccine and if there are problems she will let us know. She is aware to go to the lab anytime after noon to get the Prometheus drawn and to get the container for the 24 hour urine copper. ______

## 2014-07-09 ENCOUNTER — Other Ambulatory Visit: Payer: Self-pay | Admitting: Gastroenterology

## 2014-07-13 LAB — PROMETHEUS-MAIL

## 2014-07-14 LAB — COPPER, URINE, 24 HOUR
Copper,Urine (24 Hr): 11 mcg/L (ref 2–30)
Creatinine, Urine mg/day-CUURI: 2.06 g/(24.h) (ref 0.63–2.50)
Total Volume - CURRI: 1750 mL

## 2014-07-22 NOTE — Progress Notes (Signed)
Quick Note:  Can you please find the results for the fibrotest. ______

## 2014-07-22 NOTE — Progress Notes (Signed)
Quick Note:  Let patient know copper level normal. Should have OV this month with [facility], on recall. Please go ahead and schedule. ______

## 2014-07-23 NOTE — Progress Notes (Signed)
Quick Note:  Pt is aware and Yolanda [name] is scheduling her OV appt. ( Pt thought she would not have to come in since she saw Neil Crouch, PA, but I told her that [name] said for her to see Dr. [name] in Jan. Call transferred to [facility] to schedule. ( Pt said she is just tired of coming in). ______

## 2014-07-24 NOTE — Progress Notes (Signed)
Quick Note:  Reviewed [facility] Fibrospect II results. Interesting findings. She had an F0-F1 score which would indicate none to very little fibrosis. Previously her elastography showed F4. It is not clear the degree of fibrosis present given significant discrepancies in these tests.  I noticed patient wants to delay her upcoming OV. That is fine if she is feeling well.  We could have her come back in 11/2014 to see SLF (dx: liver issues). ______

## 2014-07-27 NOTE — Progress Notes (Signed)
ON RECALL LIST  °

## 2014-07-27 NOTE — Progress Notes (Signed)
Quick Note:  Pt is aware of results. OK to nic for OV with Dr. [name] in May 2016. Please cancel the other nics. Pt said she is doing well and will call if she has problems before then. ______

## 2014-07-29 ENCOUNTER — Telehealth: Payer: Self-pay | Admitting: Gastroenterology

## 2014-07-29 ENCOUNTER — Encounter: Payer: Self-pay | Admitting: Gastroenterology

## 2014-07-29 NOTE — Telephone Encounter (Deleted)
Dr. [name], please see the 07/09/2014 copper urine final result note also! Thanks!

## 2014-07-29 NOTE — Telephone Encounter (Signed)
SPOKE WITH PT. SHE HAS FATTY LIVER DISEASE. THE RELIABILITY OF THE ELASTOGRAPHY IS REDUCED IN FATTY LIVER DISEASE. IT IS REASSURING THAT HER BLOOD TEST SHOWS NEAR NORMAL LIVER TISSUE. AS WELL HER LIVER ENZYMES/CBC/SEROLOGIES IN 2015 ARE NORMAL. HER BMI CONTINUES TO BE > 40. EXPLAINED TO PT THAT THE BEST THING SHE CAN DO IS LOSE WEIGHT. OPV MAY 2016.

## 2014-07-29 NOTE — Telephone Encounter (Signed)
-----   Message from [facility], [place] sent at 8/34/1964  9:10 AM EST ----- Pt is aware and Yolanda [name] is scheduling her OV appt. ( Pt thought she would not have to come in since she saw Neil Crouch, PA, but I told her that [name] said for her to see Dr. [name] in Jan. Call transferred to [facility] to schedule. ( Pt said she is just tired of coming in).

## 2014-07-29 NOTE — Telephone Encounter (Signed)
REVIEWED-NO ADDITIONAL RECOMMENDATIONS. 

## 2014-07-29 NOTE — Telephone Encounter (Signed)
Called patient TO DISCUSS CONCERNS. LVM-CALL [phone] TO DISCUSS. AWAITING RETURN CALL.

## 2014-07-29 NOTE — Telephone Encounter (Signed)
Pt was returning SF call. Please call her back at [phone]

## 2014-08-07 ENCOUNTER — Encounter: Payer: Self-pay | Admitting: Gastroenterology

## 2014-09-08 ENCOUNTER — Encounter: Payer: Self-pay | Admitting: Gastroenterology

## 2014-09-29 ENCOUNTER — Other Ambulatory Visit: Payer: Self-pay

## 2014-09-29 ENCOUNTER — Telehealth: Payer: Self-pay | Admitting: Gastroenterology

## 2014-09-29 DIAGNOSIS — K760 Fatty (change of) liver, not elsewhere classified: Secondary | ICD-10-CM

## 2014-09-29 NOTE — Telephone Encounter (Signed)
PATIENT ON April RECALL FOR ABD ULTRASOUND

## 2014-09-29 NOTE — Telephone Encounter (Signed)
Mailed reminder letter to pt. She is set up for 10/12/2014 @ [facility].  No pre-cert is required.

## 2014-10-12 ENCOUNTER — Ambulatory Visit (HOSPITAL_COMMUNITY): Admission: RE | Admit: 2014-10-12 | Payer: PRIVATE HEALTH INSURANCE | Source: Ambulatory Visit

## 2014-11-04 ENCOUNTER — Encounter: Payer: Self-pay | Admitting: Gastroenterology

## 2014-11-17 ENCOUNTER — Other Ambulatory Visit: Payer: Self-pay

## 2014-11-17 ENCOUNTER — Encounter (HOSPITAL_COMMUNITY): Payer: Self-pay

## 2014-11-17 ENCOUNTER — Encounter (HOSPITAL_COMMUNITY)
Admission: RE | Admit: 2014-11-17 | Discharge: 2014-11-17 | Disposition: A | Discharge status: Home / Self Care | Payer: PRIVATE HEALTH INSURANCE | Source: Ambulatory Visit | Attending: Obstetrics and Gynecology | Admitting: Obstetrics and Gynecology

## 2014-11-17 DIAGNOSIS — F329 Major depressive disorder, single episode, unspecified: Secondary | ICD-10-CM | POA: Diagnosis not present

## 2014-11-17 DIAGNOSIS — N950 Postmenopausal bleeding: Secondary | ICD-10-CM | POA: Diagnosis present

## 2014-11-17 DIAGNOSIS — Z882 Allergy status to sulfonamides status: Secondary | ICD-10-CM | POA: Diagnosis not present

## 2014-11-17 DIAGNOSIS — K219 Gastro-esophageal reflux disease without esophagitis: Secondary | ICD-10-CM | POA: Diagnosis not present

## 2014-11-17 DIAGNOSIS — I252 Old myocardial infarction: Secondary | ICD-10-CM | POA: Diagnosis not present

## 2014-11-17 DIAGNOSIS — N840 Polyp of corpus uteri: Secondary | ICD-10-CM | POA: Diagnosis not present

## 2014-11-17 DIAGNOSIS — I509 Heart failure, unspecified: Secondary | ICD-10-CM | POA: Diagnosis not present

## 2014-11-17 DIAGNOSIS — Z87442 Personal history of urinary calculi: Secondary | ICD-10-CM | POA: Diagnosis not present

## 2014-11-17 DIAGNOSIS — Z86711 Personal history of pulmonary embolism: Secondary | ICD-10-CM | POA: Diagnosis not present

## 2014-11-17 DIAGNOSIS — Z87891 Personal history of nicotine dependence: Secondary | ICD-10-CM | POA: Diagnosis not present

## 2014-11-17 DIAGNOSIS — I10 Essential (primary) hypertension: Secondary | ICD-10-CM | POA: Diagnosis not present

## 2014-11-17 DIAGNOSIS — F419 Anxiety disorder, unspecified: Secondary | ICD-10-CM | POA: Diagnosis not present

## 2014-11-17 DIAGNOSIS — E039 Hypothyroidism, unspecified: Secondary | ICD-10-CM | POA: Diagnosis not present

## 2014-11-17 HISTORY — DX: Other specified postprocedural states: Z98.890

## 2014-11-17 HISTORY — DX: Hypothyroidism, unspecified: E03.9

## 2014-11-17 HISTORY — DX: Other specified postprocedural states: R11.2

## 2014-11-17 LAB — COMPREHENSIVE METABOLIC PANEL
ALT: 26 U/L (ref 14–54)
AST: 28 U/L (ref 15–41)
Albumin: 3.4 g/dL — ABNORMAL LOW (ref 3.5–5.0)
Alkaline Phosphatase: 78 U/L (ref 38–126)
Anion gap: 5 (ref 5–15)
BUN: 12 mg/dL (ref 6–20)
CO2: 26 mmol/L (ref 22–32)
Calcium: 8.5 mg/dL — ABNORMAL LOW (ref 8.9–10.3)
Chloride: 109 mmol/L (ref 101–111)
Creatinine, Ser: 0.6 mg/dL (ref 0.44–1.00)
GFR calc Af Amer: >60 mL/min (ref >60)
GFR calc non Af Amer: >60 mL/min (ref >60)
Glucose, Bld: 108 mg/dL — ABNORMAL HIGH (ref 70–99)
Potassium: 3.9 mmol/L (ref 3.5–5.1)
SODIUM: 140 mmol/L (ref 135–145)
TOTAL PROTEIN: 7.2 g/dL (ref 6.5–8.1)
Total Bilirubin: 0.8 mg/dL (ref 0.3–1.2)

## 2014-11-17 LAB — CBC
HCT: 41 % (ref 36.0–46.0)
HEMOGLOBIN: 14.2 g/dL (ref 12.0–15.0)
MCH: 31.4 pg (ref 26.0–34.0)
MCHC: 34.6 g/dL (ref 30.0–36.0)
MCV: 90.7 fL (ref 78.0–100.0)
Platelets: 243 10*3/uL (ref 150–400)
RBC: 4.52 MIL/uL (ref 3.87–5.11)
RDW: 13.2 % (ref 11.5–15.5)
WBC: 8 10*3/uL (ref 4.0–10.5)

## 2014-11-17 NOTE — H&P (Signed)
Yolanda [name] is an 59 y.o. female, referred for PMB. Menopausal for about 8 years, no bleeding. March 31 had menstrual like bleeding for 11 days. Saw PCP on 4-11, put on 7 days of Provera, stopped bleeding that day. Bleeding has returned, taking Aygestin without much relief.  Also having some burning pelvic pain. No unusual discharge, no urinary problems, no unusual bowel problems. She is not sexually active.  I was unable to obtain an endometrial biopsy sue to stenotic cervix and patient discomfort, pelvic ultrasound with normal uterus and adnexa ecxept for a 10 mm endometrium.  Pertinent Gynecological History: Last pap: normal Date: 02/2012 OB History: G2, P2002 LTCS x 2   Menstrual History: No LMP recorded. Patient is postmenopausal.    Past Medical History  Diagnosis Date  . Fibromyalgia   . PE (pulmonary embolism)   . Depression   . Chronic pain in left foot   . Chronic abdominal pain   . IBS (irritable bowel syndrome)   . Gastritis   . Internal hemorrhoids   . Kidney stone   . Thyroid disease   . Sleep apnea     hasn't worn a CPAP in 6 months  . PE 07/09/2008    Qualifier: Diagnosis of  By: Kellie Simmering LPN, Almyra Free    . Obesity, morbid 09/11/2012  . GERD 12/21/2009    Qualifier: Diagnosis of  By: Craige Cotta    . Calculus of kidney 07/09/2008    Qualifier: Diagnosis of  By: Kellie Simmering LPN, Almyra Free    . Hypertension     stopped meds in Aug 2015  . PONV (postoperative nausea and vomiting)   . Hypothyroidism     Past Surgical History  Procedure Laterality Date  . Cesarean section      X2  . Breast lumpectomy    . Dilation and curettage of uterus    . Abdominal surgery      laparoscopy  . Cystoscopy w/ retrogrades  01/23/2012    Procedure: CYSTOSCOPY WITH RETROGRADE PYELOGRAM;  Surgeon: Marissa Nestle, MD;  Location: [facility];  Service: Urology;  Laterality: Left;  . Removal of stones  01/23/2012    Procedure: REMOVAL OF STONES;  Surgeon: Marissa Nestle, MD;  Location: [facility]  [facility];  Service: Urology;  Laterality: N/A;  . Colonoscopy  2006    internal hemorrhoids  . Esophagogastroduodenoscopy   08/24/2006    Dr. Veto Kemps erythema of the antrum without erosion or ulcers/Otherwise, normal esophagus without evidence of Barrett's path with chronic gastritis  . Colonoscopy with propofol N/A 03/24/2014    Dr. [name]: 2 tubular adenomas removed, hemorrhoids  . Polypectomy N/A 03/24/2014    Procedure: POLYPECTOMY;  Surgeon: Danie Binder, MD;  Location: [facility];  Service: Endoscopy;  Laterality: N/A;  . Esophagogastroduodenoscopy (egd) with propofol N/A 03/24/2014    Dr. [name]: gastritis  . Esophageal biopsy N/A 03/24/2014    Procedure: GASTRIC BIOPSIES;  Surgeon: Danie Binder, MD;  Location: [facility];  Service: Endoscopy;  Laterality: N/A;    Family History  Problem Relation Age of Onset  . Heart attack Mother     CABG  . Heart attack Father     CABG  . Heart attack Brother   . Stroke Mother   . Hypertension Mother   . Hypertension Father   . Colon cancer Neg Hx     Social History:  reports that she has quit smoking. Her smoking use included Cigarettes. She does not have any smokeless tobacco history  on file. She reports that she drinks alcohol. She reports that she does not use illicit drugs.  Allergies:  Allergies  Allergen Reactions  . Sulfonamide Derivatives Other (See Comments)    Vaginal Infection     No prescriptions prior to admission    Review of Systems  Respiratory: Negative.   Cardiovascular: Negative.   Gastrointestinal: Negative.   Genitourinary: Negative.     There were no vitals taken for this visit. Physical Exam  Constitutional: She appears well-developed and well-nourished.  Neck: Neck supple. No thyromegaly present.  Cardiovascular: Normal rate, regular rhythm and normal heart sounds.   No murmur heard. Respiratory: Effort normal and breath sounds normal. No respiratory distress. She has no wheezes.  GI: Soft. She  exhibits no distension and no mass.  Genitourinary: Vagina normal.  Uterus and adnexa difficult to palpate due to habitus    Results for orders placed or performed during the hospital encounter of 11/17/14 (from the past 24 hour(s))  CBC     Status: None   Collection Time: 11/17/14  9:05 AM  Result Value Ref Range   WBC 8.0 4.0 - 10.5 K/uL   RBC 4.52 3.87 - 5.11 MIL/uL   Hemoglobin 14.2 12.0 - 15.0 g/dL   HCT 41.0 36.0 - 46.0 %   MCV 90.7 78.0 - 100.0 fL   MCH 31.4 26.0 - 34.0 pg   MCHC 34.6 30.0 - 36.0 g/dL   RDW 13.2 11.5 - 15.5 %   Platelets 243 150 - 400 K/uL  Comprehensive metabolic panel     Status: Abnormal   Collection Time: 11/17/14  9:05 AM  Result Value Ref Range   Sodium 140 135 - 145 mmol/L   Potassium 3.9 3.5 - 5.1 mmol/L   Chloride 109 101 - 111 mmol/L   CO2 26 22 - 32 mmol/L   Glucose, Bld 108 (H) 70 - 99 mg/dL   BUN 12 6 - 20 mg/dL   Creatinine, Ser 0.60 0.44 - 1.00 mg/dL   Calcium 8.5 (L) 8.9 - 10.3 mg/dL   Total Protein 7.2 6.5 - 8.1 g/dL   Albumin 3.4 (L) 3.5 - 5.0 g/dL   AST 28 15 - 41 U/L   ALT 26 14 - 54 U/L   Alkaline Phosphatase 78 38 - 126 U/L   Total Bilirubin 0.8 0.3 - 1.2 mg/dL   GFR calc non Af Amer >60 >60 mL/min   GFR calc Af Amer >60 >60 mL/min   Anion gap 5 5 - 15    No results found.  Assessment/Plan: PMB with thickened endometrium by ultrasound, unable to obtain endometrial sample in the office.  Will admit for hysteroscopy, D&C, possible resection of polyp.  Procedure, risks, alternatives, chances of relieving bleeding have all been discussed.    Agustus Mane D 11/17/2014, 6:14 PM

## 2014-11-17 NOTE — Patient Instructions (Signed)
Your procedure is scheduled on: 11/18/14  Enter through the Main Entrance at :[place] up desk phone and dial [phone] and inform us of your arrival.  Please call [phone] if you have any problems the morning of surgery.  Remember: Do not eat food or drink liquids, including water, after midnight:tonight   You may brush your teeth the morning of surgery.  Take these meds the morning of surgery with a sip of water:thyroid pill and BP med  DO NOT wear jewelry, eye make-up, lipstick,body lotion, or dark fingernail polish.  (Polished toes are ok) You may wear deodorant.  If you are to be admitted after surgery, leave suitcase in car until your room has been assigned. Patients discharged on the day of surgery will not be allowed to drive home. Wear loose fitting, comfortable clothes for your ride home.

## 2014-11-18 ENCOUNTER — Ambulatory Visit (HOSPITAL_COMMUNITY): Payer: PRIVATE HEALTH INSURANCE | Admitting: Certified Registered Nurse Anesthetist

## 2014-11-18 ENCOUNTER — Ambulatory Visit (HOSPITAL_COMMUNITY)
Admission: RE | Admit: 2014-11-18 | Discharge: 2014-11-18 | Disposition: A | Discharge status: Home / Self Care | Payer: PRIVATE HEALTH INSURANCE | Source: Ambulatory Visit | Attending: Obstetrics and Gynecology | Admitting: Obstetrics and Gynecology

## 2014-11-18 ENCOUNTER — Encounter (HOSPITAL_COMMUNITY): Payer: Self-pay | Admitting: Certified Registered Nurse Anesthetist

## 2014-11-18 ENCOUNTER — Encounter (HOSPITAL_COMMUNITY)
Admission: RE | Disposition: A | Discharge status: Home / Self Care | Payer: Self-pay | Source: Ambulatory Visit | Attending: Obstetrics and Gynecology

## 2014-11-18 DIAGNOSIS — K219 Gastro-esophageal reflux disease without esophagitis: Secondary | ICD-10-CM | POA: Insufficient documentation

## 2014-11-18 DIAGNOSIS — Z87442 Personal history of urinary calculi: Secondary | ICD-10-CM | POA: Insufficient documentation

## 2014-11-18 DIAGNOSIS — Z86711 Personal history of pulmonary embolism: Secondary | ICD-10-CM | POA: Insufficient documentation

## 2014-11-18 DIAGNOSIS — F329 Major depressive disorder, single episode, unspecified: Secondary | ICD-10-CM | POA: Insufficient documentation

## 2014-11-18 DIAGNOSIS — I252 Old myocardial infarction: Secondary | ICD-10-CM | POA: Insufficient documentation

## 2014-11-18 DIAGNOSIS — I509 Heart failure, unspecified: Secondary | ICD-10-CM | POA: Insufficient documentation

## 2014-11-18 DIAGNOSIS — Z87891 Personal history of nicotine dependence: Secondary | ICD-10-CM | POA: Insufficient documentation

## 2014-11-18 DIAGNOSIS — E039 Hypothyroidism, unspecified: Secondary | ICD-10-CM | POA: Insufficient documentation

## 2014-11-18 DIAGNOSIS — F419 Anxiety disorder, unspecified: Secondary | ICD-10-CM | POA: Insufficient documentation

## 2014-11-18 DIAGNOSIS — N840 Polyp of corpus uteri: Secondary | ICD-10-CM | POA: Insufficient documentation

## 2014-11-18 DIAGNOSIS — Z882 Allergy status to sulfonamides status: Secondary | ICD-10-CM | POA: Insufficient documentation

## 2014-11-18 DIAGNOSIS — N950 Postmenopausal bleeding: Secondary | ICD-10-CM | POA: Diagnosis present

## 2014-11-18 DIAGNOSIS — I10 Essential (primary) hypertension: Secondary | ICD-10-CM | POA: Insufficient documentation

## 2014-11-18 HISTORY — PX: DILATATION & CURETTAGE/HYSTEROSCOPY WITH MYOSURE: SHX6511

## 2014-11-18 SURGERY — DILATATION & CURETTAGE/HYSTEROSCOPY WITH MYOSURE
Anesthesia: General | Site: Vagina

## 2014-11-18 MED ORDER — ACETAMINOPHEN 160 MG/5ML PO SOLN: 325.0000 mg | ORAL | Status: DC | PRN

## 2014-11-18 MED ORDER — LIDOCAINE HCL 2 % IJ SOLN
INTRAMUSCULAR | Status: DC | PRN
  Administered 2014-11-18: 16 mL

## 2014-11-18 MED ORDER — EPHEDRINE 5 MG/ML INJ
INTRAVENOUS | Status: AC
  Filled 2014-11-18: qty 10

## 2014-11-18 MED ORDER — OXYCODONE HCL 5 MG/5ML PO SOLN: 5.0000 mg | Freq: Once | ORAL | Status: DC | PRN

## 2014-11-18 MED ORDER — MIDAZOLAM HCL 2 MG/2ML IJ SOLN
INTRAMUSCULAR | Status: DC | PRN
  Administered 2014-11-18: 2 mg via INTRAVENOUS

## 2014-11-18 MED ORDER — ACETAMINOPHEN 325 MG PO TABS: 325.0000 mg | ORAL_TABLET | ORAL | Status: DC | PRN

## 2014-11-18 MED ORDER — LIDOCAINE HCL (CARDIAC) 20 MG/ML IV SOLN
INTRAVENOUS | Status: DC | PRN
  Administered 2014-11-18: 100 mg via INTRAVENOUS

## 2014-11-18 MED ORDER — EPHEDRINE SULFATE 50 MG/ML IJ SOLN
INTRAMUSCULAR | Status: DC | PRN
  Administered 2014-11-18: 10 mg via INTRAVENOUS
  Administered 2014-11-18: 5 mg via INTRAVENOUS

## 2014-11-18 MED ORDER — FENTANYL CITRATE (PF) 100 MCG/2ML IJ SOLN
INTRAMUSCULAR | Status: AC
  Filled 2014-11-18: qty 2

## 2014-11-18 MED ORDER — ONDANSETRON HCL 4 MG/2ML IJ SOLN
INTRAMUSCULAR | Status: DC | PRN
  Administered 2014-11-18: 4 mg via INTRAVENOUS

## 2014-11-18 MED ORDER — PROPOFOL 10 MG/ML IV BOLUS
INTRAVENOUS | Status: AC
Start: 2014-11-18 — End: 2014-11-18
  Filled 2014-11-18: qty 20

## 2014-11-18 MED ORDER — FENTANYL CITRATE (PF) 100 MCG/2ML IJ SOLN
25.0000 ug | INTRAMUSCULAR | Status: DC | PRN
  Administered 2014-11-18: 25 ug via INTRAVENOUS
  Administered 2014-11-18: 09:00:00 via INTRAVENOUS

## 2014-11-18 MED ORDER — MIDAZOLAM HCL 2 MG/2ML IJ SOLN
INTRAMUSCULAR | Status: AC
  Filled 2014-11-18: qty 2

## 2014-11-18 MED ORDER — OXYCODONE HCL 5 MG PO TABS: 5.0000 mg | ORAL_TABLET | Freq: Once | ORAL | Status: DC | PRN

## 2014-11-18 MED ORDER — PHENYLEPHRINE 40 MCG/ML (10ML) SYRINGE FOR IV PUSH (FOR BLOOD PRESSURE SUPPORT)
PREFILLED_SYRINGE | INTRAVENOUS | Status: AC
Start: 2014-11-18 — End: 2014-11-18
  Filled 2014-11-18: qty 10

## 2014-11-18 MED ORDER — FENTANYL CITRATE (PF) 100 MCG/2ML IJ SOLN
INTRAMUSCULAR | Status: DC | PRN
  Administered 2014-11-18 (×2): 50 ug via INTRAVENOUS

## 2014-11-18 MED ORDER — DEXAMETHASONE SODIUM PHOSPHATE 4 MG/ML IJ SOLN
INTRAMUSCULAR | Status: AC
  Filled 2014-11-18: qty 1

## 2014-11-18 MED ORDER — SCOPOLAMINE 1 MG/3DAYS TD PT72
MEDICATED_PATCH | TRANSDERMAL | Status: AC
  Filled 2014-11-18: qty 1

## 2014-11-18 MED ORDER — PROPOFOL 10 MG/ML IV BOLUS
INTRAVENOUS | Status: AC
  Filled 2014-11-18: qty 20

## 2014-11-18 MED ORDER — PHENYLEPHRINE HCL 10 MG/ML IJ SOLN
INTRAMUSCULAR | Status: DC | PRN
  Administered 2014-11-18: 40 ug via INTRAVENOUS
  Administered 2014-11-18 (×2): 80 ug via INTRAVENOUS

## 2014-11-18 MED ORDER — SCOPOLAMINE 1 MG/3DAYS TD PT72
1.0000 | MEDICATED_PATCH | Freq: Once | TRANSDERMAL | Status: DC
  Administered 2014-11-18: 1.5 mg via TRANSDERMAL

## 2014-11-18 MED ORDER — DEXAMETHASONE SODIUM PHOSPHATE 10 MG/ML IJ SOLN
INTRAMUSCULAR | Status: DC | PRN
  Administered 2014-11-18: 4 mg via INTRAVENOUS

## 2014-11-18 MED ORDER — LIDOCAINE HCL (CARDIAC) 20 MG/ML IV SOLN
INTRAVENOUS | Status: AC
  Filled 2014-11-18: qty 5

## 2014-11-18 MED ORDER — LIDOCAINE HCL 2 % IJ SOLN
INTRAMUSCULAR | Status: AC
  Filled 2014-11-18: qty 20

## 2014-11-18 MED ORDER — PROPOFOL 10 MG/ML IV BOLUS
INTRAVENOUS | Status: DC | PRN
  Administered 2014-11-18: 200 mg via INTRAVENOUS

## 2014-11-18 MED ORDER — ONDANSETRON HCL 4 MG/2ML IJ SOLN
INTRAMUSCULAR | Status: AC
  Filled 2014-11-18: qty 2

## 2014-11-18 MED ORDER — LACTATED RINGERS IV SOLN
INTRAVENOUS | Status: DC
  Administered 2014-11-18 (×2): via INTRAVENOUS

## 2014-11-18 MED ORDER — SODIUM CHLORIDE 0.9 % IR SOLN
Status: DC | PRN
  Administered 2014-11-18: 3000 mL

## 2014-11-18 SURGICAL SUPPLY — 15 items
CATH ROBINSON RED A/P 16FR (CATHETERS) ×3 IMPLANT
CLOTH BEACON ORANGE TIMEOUT ST (SAFETY) ×3 IMPLANT
CONTAINER PREFILL 10% NBF 60ML (FORM) ×3 IMPLANT
DEVICE MYOSURE CLASSIC (MISCELLANEOUS) ×3 IMPLANT
DEVICE MYOSURE LITE (MISCELLANEOUS) IMPLANT
FILTER ARTHROSCOPY CONVERTOR (FILTER) ×3 IMPLANT
GLOVE ORTHO TXT STRL SZ7.5 (GLOVE) ×6 IMPLANT
GOWN STRL REUS W/TWL LRG LVL3 (GOWN DISPOSABLE) ×6 IMPLANT
PACK VAGINAL MINOR WOMEN LF (CUSTOM PROCEDURE TRAY) ×3 IMPLANT
PAD OB MATERNITY 4.3X12.25 (PERSONAL CARE ITEMS) ×3 IMPLANT
SEAL ROD LENS SCOPE MYOSURE (ABLATOR) ×3 IMPLANT
TOWEL OR 17X24 6PK STRL BLUE (TOWEL DISPOSABLE) ×6 IMPLANT
TUBING AQUILEX INFLOW (TUBING) ×6 IMPLANT
TUBING AQUILEX OUTFLOW (TUBING) ×3 IMPLANT
WATER STERILE IRR 1000ML POUR (IV SOLUTION) ×3 IMPLANT

## 2014-11-18 NOTE — Discharge Instructions (Signed)
Routine instructions for hysteroscopy/D&CDISCHARGE INSTRUCTIONS: D&C / D&E The following instructions have been prepared to help you care for yourself upon your return home.   Personal hygiene:  Use sanitary pads for vaginal drainage, not tampons.  Shower the day after your procedure.  NO tub baths, pools or Jacuzzis for 2-3 weeks.  Wipe front to back after using the bathroom.  Activity and limitations:  Do NOT drive or operate any equipment for 24 hours. The effects of anesthesia are still present and drowsiness may result.  Do NOT rest in bed all day.  Walking is encouraged.  Walk up and down stairs slowly.  You may resume your normal activity in one to two days or as indicated by your physician.  Sexual activity: NO intercourse for at least 2 weeks after the procedure, or as indicated by your physician.  Diet: Eat a light meal as desired this evening. You may resume your usual diet tomorrow.  Return to work: You may resume your work activities in one to two days or as indicated by your doctor.  What to expect after your surgery: Expect to have vaginal bleeding/discharge for 2-3 days and spotting for up to 10 days. It is not unusual to have soreness for up to 1-2 weeks. You may have a slight burning sensation when you urinate for the first day. Mild cramps may continue for a couple of days. You may have a regular period in 2-6 weeks.  Call your doctor for any of the following:  Excessive vaginal bleeding, saturating and changing one pad every hour.  Inability to urinate 6 hours after discharge from hospital.  Pain not relieved by pain medication.  Fever of 100.4 F or greater.  Unusual vaginal discharge or odor.   Call for an appointment:    Patients signature: ______________________  Nurses signature ________________________  Support person's signature_______________________

## 2014-11-18 NOTE — Anesthesia Postprocedure Evaluation (Signed)
  Anesthesia Post-op Note  Patient: Yolanda [name]  Procedure(s) Performed: Procedure(s): DILATATION & CURETTAGE/HYSTEROSCOPY WITH MYOSURE, resection of polyp (N/A)  Patient Location: PACU  Anesthesia Type:General  Level of Consciousness: awake  Airway and Oxygen Therapy: Patient Spontanous Breathing  Post-op Pain: none  Post-op Assessment: Post-op Vital signs reviewed, Patient's Cardiovascular Status Stable, Respiratory Function Stable, Patent Airway, No signs of Nausea or vomiting and Pain level controlled  Post-op Vital Signs: Reviewed and stable  Last Vitals:  Filed Vitals:   11/18/14 1036  BP:   Pulse: 95  Temp: 37 C  Resp: 16    Complications: No apparent anesthesia complications

## 2014-11-18 NOTE — Anesthesia Procedure Notes (Signed)
Procedure Name: LMA Insertion Date/Time: 11/18/2014 8:29 AM Performed by: Raenette Rover Pre-anesthesia Checklist: Patient identified, Emergency Drugs available, Suction available and Patient being monitored Patient Re-evaluated:Patient Re-evaluated prior to inductionOxygen Delivery Method: Circle system utilized Preoxygenation: Pre-oxygenation with 100% oxygen Intubation Type: IV induction Ventilation: Mask ventilation without difficulty LMA: LMA inserted LMA Size: 4.0 Number of attempts: 2 Airway Equipment and Method: Patient positioned with wedge pillow Placement Confirmation: positive ETCO2,  CO2 detector and breath sounds checked- equal and bilateral Tube secured with: Tape Dental Injury: Teeth and Oropharynx as per pre-operative assessment

## 2014-11-18 NOTE — Interval H&P Note (Signed)
History and Physical Interval Note:  11/18/2014 8:07 AM  Yolanda [name]  has presented today for surgery, with the diagnosis of PMB, Stenotic Cervix  The various methods of treatment have been discussed with the patient and family. After consideration of risks, benefits and other options for treatment, the patient has consented to  Procedure(s): [place], possible resection of polyp (N/A) as a surgical intervention .  The patient's history has been reviewed, patient examined, no change in status, stable for surgery.  I have reviewed the patient's chart and labs.  Questions were answered to the patient's satisfaction.     Rabecka Brendel D

## 2014-11-18 NOTE — Transfer of Care (Signed)
Immediate Anesthesia Transfer of Care Note  Patient: Yolanda [name]  Procedure(s) Performed: Procedure(s): [place], resection of polyp (N/A)  Patient Location: PACU  Anesthesia Type:General  Level of Consciousness: awake, alert , oriented and patient cooperative  Airway & Oxygen Therapy: Patient Spontanous Breathing and Patient connected to nasal cannula oxygen  Post-op Assessment: Report given to RN and Post -op Vital signs reviewed and stable  Post vital signs: Reviewed and stable  Last Vitals:  Filed Vitals:   11/18/14 0645  BP: 144/80  Pulse: 77  Temp: 36.8 C  Resp: 18    Complications: No apparent anesthesia complications

## 2014-11-18 NOTE — Op Note (Signed)
Preoperative diagnosis: Postmenopausal bleeding Postoperative diagnosis: Same with endometrial polyp Procedure: Hysteroscopy with dilation and curettage, resection of polyp with Myosure Surgeon: Cheri Fowler M.D. Anesthesia: Gen. With an LMA, paracervical block Findings: She had a normal endometrial cavity with one large polyp. Estimated blood loss: Minimal Fluid deficit: Through the hysteroscope fluid deficit was about 65 cc Specimens: Endometrial resections and curettings sent for routine pathology Complications: None  Procedure in detail: The patient was taken to the operating room and placed in the dorsosupine position. General anesthesia was induced. She was placed in mobile stirrups and legs were elevated. Perineum and vagina were prepped and draped in the usual sterile fashion and bladder drained with a red Robinson catheter. A Graves speculum was inserted in the vagina and the anterior lip of the cervix was grasped with a single-tooth tenaculum. Paracervical block was performed with a total of 16 cc of 2% plain lidocaine. The uterus then sounded to 8 cm. The cervix was gradually dilated to a size 19 dilator without difficulty. The observer hysteroscope was inserted and good visualization was achieved.  An atrophic cavity with a large endometrial polyp was seen. The hysteroscope was removed and the Myosure scope was inserted after the cervix was dilated to a size 25 dilator.  The polyp was then easily completely resected with the Myosure, and some directed endometrial curettage was performed as well.  The cavity was now completely clear and the hysteroscope was removed. Sharp curettage was performed which revealed minimal endometrial tissue. The single-tooth tenaculum was removed from the cervix and bleeding was controlled with pressure. All instruments were then removed from the vagina. The patient tolerated the procedure well and was taken to the recovery in stable condition. Counts were  correct and she had PAS hose on throughout the procedure.

## 2014-11-18 NOTE — Anesthesia Preprocedure Evaluation (Signed)
Anesthesia Evaluation  Patient identified by MRN, date of birth, ID band Patient awake    Reviewed: Allergy & Precautions, NPO status , Patient's Chart, lab work & pertinent test results  History of Anesthesia Complications (+) history of anesthetic complications  Airway Mallampati: III  TM Distance: >3 FB Neck ROM: Full    Dental  (+) Teeth Intact   Pulmonary neg shortness of breath, neg sleep apnea, neg COPDneg recent URI, former smoker, PE breath sounds clear to auscultation- rhonchi        Cardiovascular hypertension, Pt. on medications - angina- Past MI and - CHF - dysrhythmias Rhythm:Regular     Neuro/Psych PSYCHIATRIC DISORDERS Anxiety Depression negative neurological ROS     GI/Hepatic negative GI ROS, Fatty liver   Endo/Other  Hypothyroidism Morbid obesity  Renal/GU negative Renal ROS     Musculoskeletal  (+) Fibromyalgia -  Abdominal   Peds  Hematology negative hematology ROS (+)   Anesthesia Other Findings   Reproductive/Obstetrics                             Anesthesia Physical Anesthesia Plan  ASA: III  Anesthesia Plan: General   Post-op Pain Management:    Induction: Intravenous  Airway Management Planned: LMA  Additional Equipment: None  Intra-op Plan:   Post-operative Plan: Extubation in OR  Informed Consent: I have reviewed the patients History and Physical, chart, labs and discussed the procedure including the risks, benefits and alternatives for the proposed anesthesia with the patient or authorized representative who has indicated his/her understanding and acceptance.   Dental advisory given  Plan Discussed with: CRNA and Surgeon  Anesthesia Plan Comments:         Anesthesia Quick Evaluation

## 2014-11-19 ENCOUNTER — Encounter (HOSPITAL_COMMUNITY): Payer: Self-pay | Admitting: Obstetrics and Gynecology

## 2014-12-08 ENCOUNTER — Encounter: Payer: Self-pay | Admitting: Nurse Practitioner

## 2014-12-08 ENCOUNTER — Ambulatory Visit (INDEPENDENT_AMBULATORY_CARE_PROVIDER_SITE_OTHER): Payer: PRIVATE HEALTH INSURANCE | Admitting: Nurse Practitioner

## 2014-12-08 VITALS — BP 152/91 | HR 88 | Temp 97.9°F | Ht 64.0 in | Wt 267.0 lb

## 2014-12-08 DIAGNOSIS — R1011 Right upper quadrant pain: Secondary | ICD-10-CM | POA: Diagnosis not present

## 2014-12-08 DIAGNOSIS — R109 Unspecified abdominal pain: Secondary | ICD-10-CM | POA: Insufficient documentation

## 2014-12-08 NOTE — Patient Instructions (Signed)
1. Have your ultrasound done to further evaluate ureter quadrant pain. 2. I feel this is most likely musculoskeletal given your symptoms and situations that she described. You can try over-the-counter pain medication as needed along with resting and avoiding drastic movements, heavy lifting, pulling, or working on appliances. 3. If her symptoms do not improve, contact your primary care provider. 4. Notify us of any changes in your stomach or colon problems. 5. We will set you up for a visit with Dr. [name] to further discuss your possible liver issues.

## 2014-12-08 NOTE — Progress Notes (Addendum)
Referring Provider: Sharilyn Sites, MD Primary Care Physician:  Purvis Kilts, MD Primary GI:  Dr. [name]  Chief Complaint  Patient presents with  . Abdominal Pain    right side    HPI:   59 year old female with a history of Wilson's disease and questionable liver fibrosis presents for complaints of right-sided abdominal pain. On 03/21/14 she underwent an ultrasound with elastography for h/o fatty liver, which revealed mild hepatomegaly, fatty liver, Metavir F4. LFTs normal. HCV and Hep B surface Ag were negative. No prior Hep B vaccines. FH of Wilson's disease (2 nieces), one succumbed to disease in her 18s, the other with cirrhosis., however Prometheus test 07/09/14 showed F0 to F1. Unknown reasons behind discrepancy. 24 urine for copper was a thin normal limits. Last endoscopy and colonoscopy September 2015 showed hemorrhoids, 2 tubular adenomas removed, gastritis/duodenitis.  Today she states she has had RUQ burning pain for the last 2 weeks, increased fatigue and weakness. Has a history of fibromyalgia. Her pain was intermittent when it started but has become constant for most of the day. Worse with coughing or standing, but continues to have pain while not doing these things. Sensitive to the touch. Denies N/V. Denies hematochezia, melena, fever, chills, unintentional weight loss. Pain is 8/10 pain scale. Denies chest pain, dyspnea. After further discussion during the exam patient states she is caring for her elderly parents which involves some lifting and pulling. Also states about 2-3 weeks ago she had to work on her dryer which involved a decent amount of heavy lifting as well as "having to slide on and off the dryer cause I'm short and overweight. In fact I had a pretty good bruise just under that area."   Past Medical History  Diagnosis Date  . Fibromyalgia   . PE (pulmonary embolism)   . Depression   . Chronic pain in left foot   . Chronic abdominal pain   . IBS (irritable  bowel syndrome)   . Gastritis   . Internal hemorrhoids   . Kidney stone   . Thyroid disease   . Sleep apnea     hasn't worn a CPAP in 6 months  . PE 07/09/2008    Qualifier: Diagnosis of  By: Kellie Simmering LPN, Almyra Free    . Obesity, morbid 09/11/2012  . GERD 12/21/2009    Qualifier: Diagnosis of  By: Craige Cotta    . Calculus of kidney 07/09/2008    Qualifier: Diagnosis of  By: Kellie Simmering LPN, Almyra Free    . Hypertension     stopped meds in Aug 2015  . PONV (postoperative nausea and vomiting)   . Hypothyroidism     Past Surgical History  Procedure Laterality Date  . Cesarean section      X2  . Breast lumpectomy    . Dilation and curettage of uterus    . Abdominal surgery      laparoscopy  . Cystoscopy w/ retrogrades  01/23/2012    Procedure: CYSTOSCOPY WITH RETROGRADE PYELOGRAM;  Surgeon: Marissa Nestle, MD;  Location: [facility];  Service: Urology;  Laterality: Left;  . Removal of stones  01/23/2012    Procedure: REMOVAL OF STONES;  Surgeon: Marissa Nestle, MD;  Location: [facility];  Service: Urology;  Laterality: N/A;  . Colonoscopy  2006    internal hemorrhoids  . Esophagogastroduodenoscopy   08/24/2006    Dr. Veto Kemps erythema of the antrum without erosion or ulcers/Otherwise, normal esophagus without evidence of Barrett's path with chronic gastritis  .  Colonoscopy with propofol N/A 03/24/2014    Dr. [name]: 2 tubular adenomas removed, hemorrhoids  . Polypectomy N/A 03/24/2014    Procedure: POLYPECTOMY;  Surgeon: Danie Binder, MD;  Location: [facility];  Service: Endoscopy;  Laterality: N/A;  . Esophagogastroduodenoscopy (egd) with propofol N/A 03/24/2014    Dr. [name]: gastritis  . Esophageal biopsy N/A 03/24/2014    Procedure: GASTRIC BIOPSIES;  Surgeon: Danie Binder, MD;  Location: [facility];  Service: Endoscopy;  Laterality: N/A;  . Dilatation & curettage/hysteroscopy with myosure N/A 11/18/2014    Procedure: DILATATION & CURETTAGE/HYSTEROSCOPY WITH MYOSURE, resection of polyp;   Surgeon: Cheri Fowler, MD;  Location: [facility];  Service: Gynecology;  Laterality: N/A;    Current Outpatient Prescriptions  Medication Sig Dispense Refill  . levothyroxine (SYNTHROID, LEVOTHROID) 75 MCG tablet Take 75 mcg by mouth daily before breakfast.    . lisinopril (PRINIVIL,ZESTRIL) 40 MG tablet Take 40 mg by mouth daily.    . norethindrone (AYGESTIN) 5 MG tablet Take 5 mg by mouth daily.    . ondansetron (ZOFRAN ODT) 4 MG disintegrating tablet Take 1 tablet (4 mg total) by mouth every 8 (eight) hours as needed for nausea or vomiting. (Patient not taking: Reported on 11/05/2014) 20 tablet 0  . oxyCODONE-acetaminophen (PERCOCET/ROXICET) 5-325 MG per tablet Take 1 tablet by mouth every 4 (four) hours as needed. (Patient not taking: Reported on 11/05/2014) 84 tablet 0  . pantoprazole (PROTONIX) 40 MG tablet Take 1 tablet (40 mg total) by mouth daily. (Patient not taking: Reported on 11/05/2014) 30 tablet 0  . traMADol (ULTRAM) 50 MG tablet Take 50-100 mg by mouth every 12 (twelve) hours as needed for moderate pain.      No current facility-administered medications for this visit.    Allergies as of 12/08/2014 - Review Complete 12/08/2014  Allergen Reaction Noted  . Sulfonamide derivatives Other (See Comments)     Family History  Problem Relation Age of Onset  . Heart attack Mother     CABG  . Heart attack Father     CABG  . Heart attack Brother   . Stroke Mother   . Hypertension Mother   . Hypertension Father   . Colon cancer Neg Hx     History   Social History  . Marital Status: Divorced    Spouse Name: N/A  . Number of Children: N/A  . Years of Education: N/A   Occupational History  . [employer]    Social History Main Topics  . Smoking status: Former Smoker    Types: Cigarettes  . Smokeless tobacco: Not on file  . Alcohol Use: Yes     Comment: ocassionally  . Drug Use: No  . Sexual Activity: No   Other Topics Concern  . None   Social History  Narrative    Review of Systems: 10 point ROS negative except as per HPI.   Physical Exam: BP 152/91 mmHg  Pulse 88  Temp(Src) 97.9 F (36.6 C) (Oral)  Ht 5' 4"  (1.626 m)  Wt 267 lb (121.11 kg)  BMI 45.81 kg/m2 General:   Morbidly obese. Alert and oriented. Pleasant and cooperative. Well-nourished and well-developed.  Head:  Normocephalic and atraumatic. Eyes:  Without icterus, sclera clear and conjunctiva pink.  Ears:  Normal auditory acuity. Cardiovascular:  S1, S2 present without murmurs appreciated. Normal pulses noted. Extremities without clubbing or edema. Respiratory:  Clear to auscultation bilaterally. No wheezes, rales, or rhonchi. No distress.  Gastrointestinal:  +BS, soft, and  non-distended. No HSM noted. No guarding or rebound. No masses appreciated.  Rectal:  Deferred  Musculoskalatal:  Symmetrical without gross deformities. Patient with increased moderate to severe pain with palpation of right side costals. Skin:  Intact without significant lesions or rashes. Specifically no dermatomal pattern rash. Neurologic:  Alert and oriented x4;  grossly normal neurologically. Psych:  Alert and cooperative. Normal mood and affect. Heme/Lymph/Immune: Residual bruise noted to RUQ area of concern.    12/08/2014 8:30 AM

## 2014-12-08 NOTE — Assessment & Plan Note (Addendum)
59 year old patient presents with right upper quadrant pain. While interviewing her states she has been doing some heavy lifting and pulling lately specifically she worked on and dryer which required sliding on and off the dryer and developed a pretty decent bruise in that area. Her pain is worse with coughing, reaching above, bending, lifting, and deep breathing. Pain is no worse after eating any type of meal. On exam she was quite tender to palpation of her right side anterior costal bones. This is elicited on very minimal palpation generally not deep enough to aggravate the underlying liver and gallbladder. However, given her family history of Wilson's disease and questionable liver cirrhosis will follow up with a right upper quadrant limited ultrasound to evaluate for gallbladder and liver issues. Generally I feel this is a muscular skeletal issue and have advised the patient on conservative measures accordingly. We will have her follow up in 3 months with Dr. [name] to further address her potential liver issues, specifically the contradictory results between elastography and Prometheus testing.

## 2014-12-14 ENCOUNTER — Ambulatory Visit (HOSPITAL_COMMUNITY)
Admission: RE | Admit: 2014-12-14 | Discharge: 2014-12-14 | Disposition: A | Discharge status: Home / Self Care | Payer: PRIVATE HEALTH INSURANCE | Source: Ambulatory Visit | Attending: Nurse Practitioner | Admitting: Nurse Practitioner

## 2014-12-14 DIAGNOSIS — R1011 Right upper quadrant pain: Secondary | ICD-10-CM | POA: Diagnosis not present

## 2014-12-23 NOTE — Progress Notes (Signed)
cc'd to pcp 

## 2015-01-13 ENCOUNTER — Encounter: Payer: Self-pay | Admitting: Gastroenterology

## 2015-03-01 ENCOUNTER — Telehealth: Payer: Self-pay | Admitting: Gastroenterology

## 2015-03-01 NOTE — Telephone Encounter (Signed)
PATIENT CALLED AND STATED THAT SHE WANTED TO CANCEL APPOINTMENT AND SHE IS AWARE SHE HAS FATTY LIVER DISEASE AND THERE IS NOTHING THAT IS BEING DONE ABOUT IT AND SHE WILL NOT HAVE ANY MORE ULTRASOUNDS AT [place] BECAUSE SHE HAS FIBROMYALGIA AND IT HURTS TOO MUCH.

## 2015-03-01 NOTE — Telephone Encounter (Signed)
Routing to Walden Field, [place] who saw pt last in the office.

## 2015-03-02 NOTE — Telephone Encounter (Signed)
Which appointment did she cancel, her office visit with Dr. [name]?

## 2015-03-02 NOTE — Telephone Encounter (Signed)
THE OFFICE VISIT

## 2015-03-02 NOTE — Telephone Encounter (Signed)
Forwarding back to Walden Field, NP.

## 2015-03-04 ENCOUNTER — Ambulatory Visit: Payer: PRIVATE HEALTH INSURANCE | Admitting: Gastroenterology

## 2015-04-14 ENCOUNTER — Emergency Department (HOSPITAL_COMMUNITY)
Admission: EM | Admit: 2015-04-14 | Discharge: 2015-04-15 | Disposition: A | Discharge status: Home / Self Care | Payer: PRIVATE HEALTH INSURANCE | Attending: Emergency Medicine | Admitting: Emergency Medicine

## 2015-04-14 ENCOUNTER — Encounter (HOSPITAL_COMMUNITY): Payer: Self-pay

## 2015-04-14 DIAGNOSIS — Z793 Long term (current) use of hormonal contraceptives: Secondary | ICD-10-CM | POA: Diagnosis not present

## 2015-04-14 DIAGNOSIS — Z79899 Other long term (current) drug therapy: Secondary | ICD-10-CM | POA: Diagnosis not present

## 2015-04-14 DIAGNOSIS — N201 Calculus of ureter: Secondary | ICD-10-CM | POA: Insufficient documentation

## 2015-04-14 DIAGNOSIS — Z8659 Personal history of other mental and behavioral disorders: Secondary | ICD-10-CM | POA: Insufficient documentation

## 2015-04-14 DIAGNOSIS — G8929 Other chronic pain: Secondary | ICD-10-CM | POA: Insufficient documentation

## 2015-04-14 DIAGNOSIS — N179 Acute kidney failure, unspecified: Secondary | ICD-10-CM | POA: Insufficient documentation

## 2015-04-14 DIAGNOSIS — Z86711 Personal history of pulmonary embolism: Secondary | ICD-10-CM | POA: Diagnosis not present

## 2015-04-14 DIAGNOSIS — I10 Essential (primary) hypertension: Secondary | ICD-10-CM | POA: Diagnosis not present

## 2015-04-14 DIAGNOSIS — R109 Unspecified abdominal pain: Secondary | ICD-10-CM | POA: Diagnosis present

## 2015-04-14 DIAGNOSIS — M797 Fibromyalgia: Secondary | ICD-10-CM | POA: Diagnosis not present

## 2015-04-14 DIAGNOSIS — Z87891 Personal history of nicotine dependence: Secondary | ICD-10-CM | POA: Insufficient documentation

## 2015-04-14 DIAGNOSIS — K219 Gastro-esophageal reflux disease without esophagitis: Secondary | ICD-10-CM | POA: Diagnosis not present

## 2015-04-14 DIAGNOSIS — E039 Hypothyroidism, unspecified: Secondary | ICD-10-CM | POA: Diagnosis not present

## 2015-04-14 NOTE — ED Notes (Signed)
Pt reports right flank pain that started approx 2 hours ago.  Pt has been vomiting as well.  Pt reports history of kidney stones in the past

## 2015-04-15 ENCOUNTER — Emergency Department (HOSPITAL_COMMUNITY): Payer: PRIVATE HEALTH INSURANCE

## 2015-04-15 LAB — URINE MICROSCOPIC-ADD ON

## 2015-04-15 LAB — COMPREHENSIVE METABOLIC PANEL
ALBUMIN: 3.2 g/dL — AB (ref 3.5–5.0)
ALK PHOS: 93 U/L (ref 38–126)
ALT: 38 U/L (ref 14–54)
ANION GAP: 4 — AB (ref 5–15)
AST: 38 U/L (ref 15–41)
BUN: 16 mg/dL (ref 6–20)
CALCIUM: 8.3 mg/dL — AB (ref 8.9–10.3)
CO2: 27 mmol/L (ref 22–32)
Chloride: 107 mmol/L (ref 101–111)
Creatinine, Ser: 1.38 mg/dL — ABNORMAL HIGH (ref 0.44–1.00)
GFR calc Af Amer: 47 mL/min — ABNORMAL LOW (ref >60)
GFR calc non Af Amer: 41 mL/min — ABNORMAL LOW (ref >60)
GLUCOSE: 138 mg/dL — AB (ref 65–99)
Potassium: 3.6 mmol/L (ref 3.5–5.1)
SODIUM: 138 mmol/L (ref 135–145)
Total Bilirubin: 0.5 mg/dL (ref 0.3–1.2)
Total Protein: 7.2 g/dL (ref 6.5–8.1)

## 2015-04-15 LAB — CBC WITH DIFFERENTIAL/PLATELET
Basophils Absolute: 0 10*3/uL (ref 0.0–0.1)
Basophils Relative: 0 %
EOS ABS: 0.2 10*3/uL (ref 0.0–0.7)
Eosinophils Relative: 2 %
HEMATOCRIT: 42.1 % (ref 36.0–46.0)
Hemoglobin: 14.4 g/dL (ref 12.0–15.0)
LYMPHS ABS: 2.6 10*3/uL (ref 0.7–4.0)
Lymphocytes Relative: 29 %
MCH: 31.5 pg (ref 26.0–34.0)
MCHC: 34.2 g/dL (ref 30.0–36.0)
MCV: 92.1 fL (ref 78.0–100.0)
MONOS PCT: 8 %
Monocytes Absolute: 0.7 10*3/uL (ref 0.1–1.0)
NEUTROS PCT: 61 %
Neutro Abs: 5.4 10*3/uL (ref 1.7–7.7)
Platelets: 214 10*3/uL (ref 150–400)
RBC: 4.57 MIL/uL (ref 3.87–5.11)
RDW: 13.3 % (ref 11.5–15.5)
WBC: 8.9 10*3/uL (ref 4.0–10.5)

## 2015-04-15 LAB — URINALYSIS, ROUTINE W REFLEX MICROSCOPIC
Bilirubin Urine: NEGATIVE
GLUCOSE, UA: NEGATIVE mg/dL
Leukocytes, UA: NEGATIVE
Nitrite: NEGATIVE
Protein, ur: NEGATIVE mg/dL
Specific Gravity, Urine: 1.02 (ref 1.005–1.030)
Urobilinogen, UA: 0.2 mg/dL (ref 0.0–1.0)
pH: 6 (ref 5.0–8.0)

## 2015-04-15 LAB — LIPASE, BLOOD: Lipase: 34 U/L (ref 22–51)

## 2015-04-15 MED ORDER — SODIUM CHLORIDE 0.9 % IV SOLN
1000.0000 mL | Freq: Once | INTRAVENOUS | Status: AC
  Administered 2015-04-15: 1000 mL via INTRAVENOUS

## 2015-04-15 MED ORDER — HYDROMORPHONE HCL 1 MG/ML IJ SOLN
1.0000 mg | Freq: Once | INTRAMUSCULAR | Status: AC
  Administered 2015-04-15: 1 mg via INTRAVENOUS
  Filled 2015-04-15: qty 1

## 2015-04-15 MED ORDER — ONDANSETRON HCL 4 MG/2ML IJ SOLN
4.0000 mg | Freq: Once | INTRAMUSCULAR | Status: AC
  Administered 2015-04-15: 4 mg via INTRAVENOUS

## 2015-04-15 MED ORDER — OXYCODONE-ACETAMINOPHEN 5-325 MG PO TABS: 1.0000 | ORAL_TABLET | ORAL | Status: DC | PRN

## 2015-04-15 MED ORDER — ONDANSETRON HCL 4 MG/2ML IJ SOLN
4.0000 mg | Freq: Once | INTRAMUSCULAR | Status: AC
  Administered 2015-04-15: 4 mg via INTRAVENOUS
  Filled 2015-04-15: qty 2

## 2015-04-15 MED ORDER — SODIUM CHLORIDE 0.9 % IV SOLN
1000.0000 mL | INTRAVENOUS | Status: DC
  Administered 2015-04-15: 1000 mL via INTRAVENOUS

## 2015-04-15 MED ORDER — TAMSULOSIN HCL 0.4 MG PO CAPS: 0.4000 mg | ORAL_CAPSULE | Freq: Every day | ORAL | Status: DC

## 2015-04-15 MED ORDER — ONDANSETRON HCL 4 MG/2ML IJ SOLN
INTRAMUSCULAR | Status: AC
  Filled 2015-04-15: qty 2

## 2015-04-15 NOTE — ED Provider Notes (Signed)
CSN: [id number]     Arrival date & time 04/14/15  2343 History  By signing my name below, I, Yolanda [name], attest that this documentation has been prepared under the direction and in the presence of Delora Fuel, MD. Electronically Signed: Hansel [name], ED Scribe. 04/15/2015. 12:32 AM.    Chief Complaint  Patient presents with  . Flank Pain   The history is provided by the patient. No language interpreter was used.    HPI Comments: Yolanda [name] is a 59 y.o. female with hx of renal calculi who presents to the Emergency Department complaining of moderate, intermittent 8/10 right sided flank pain onset a week ago and worsened 2 hours ago to be constant and more severe. She states associated emesis onset tonight. Pt reports that she is now having some mild left flank pain. Pt reports no relieving or exacerbating factors. PCP is Dr. Hilma Favors. She denies any other symptoms.   Past Medical History  Diagnosis Date  . Fibromyalgia   . PE (pulmonary embolism)   . Depression   . Chronic pain in left foot   . Chronic abdominal pain   . IBS (irritable bowel syndrome)   . Gastritis   . Internal hemorrhoids   . Kidney stone   . Thyroid disease   . Sleep apnea     hasn't worn a CPAP in 6 months  . PE 07/09/2008    Qualifier: Diagnosis of  By: Kellie Simmering LPN, Almyra Free    . Obesity, morbid ([place]) 09/11/2012  . GERD 12/21/2009    Qualifier: Diagnosis of  By: Craige Cotta    . Calculus of kidney 07/09/2008    Qualifier: Diagnosis of  By: Kellie Simmering LPN, Almyra Free    . Hypertension     stopped meds in Aug 2015  . PONV (postoperative nausea and vomiting)   . Hypothyroidism    Past Surgical History  Procedure Laterality Date  . Cesarean section      X2  . Breast lumpectomy    . Dilation and curettage of uterus    . Abdominal surgery      laparoscopy  . Cystoscopy w/ retrogrades  01/23/2012    Procedure: CYSTOSCOPY WITH RETROGRADE PYELOGRAM;  Surgeon: Marissa Nestle, MD;  Location: [facility];  Service:  Urology;  Laterality: Left;  . Removal of stones  01/23/2012    Procedure: REMOVAL OF STONES;  Surgeon: Marissa Nestle, MD;  Location: [facility];  Service: Urology;  Laterality: N/A;  . Colonoscopy  2006    internal hemorrhoids  . Esophagogastroduodenoscopy   08/24/2006    Dr. Veto Kemps erythema of the antrum without erosion or ulcers/Otherwise, normal esophagus without evidence of Barrett's path with chronic gastritis  . Colonoscopy with propofol N/A 03/24/2014    Dr. [name]: 2 tubular adenomas removed, hemorrhoids  . Polypectomy N/A 03/24/2014    Procedure: POLYPECTOMY;  Surgeon: Danie Binder, MD;  Location: [facility];  Service: Endoscopy;  Laterality: N/A;  . Esophagogastroduodenoscopy (egd) with propofol N/A 03/24/2014    Dr. [name]: gastritis  . Esophageal biopsy N/A 03/24/2014    Procedure: GASTRIC BIOPSIES;  Surgeon: Danie Binder, MD;  Location: [facility];  Service: Endoscopy;  Laterality: N/A;  . Dilatation & curettage/hysteroscopy with myosure N/A 11/18/2014    Procedure: DILATATION & CURETTAGE/HYSTEROSCOPY WITH MYOSURE, resection of polyp;  Surgeon: Cheri Fowler, MD;  Location: [facility];  Service: Gynecology;  Laterality: N/A;   Family History  Problem Relation Age of Onset  . Heart attack Mother  CABG  . Heart attack Father     CABG  . Heart attack Brother   . Stroke Mother   . Hypertension Mother   . Hypertension Father   . Colon cancer Neg Hx    Social History  Substance Use Topics  . Smoking status: Former Smoker    Types: Cigarettes  . Smokeless tobacco: None  . Alcohol Use: Yes     Comment: ocassionally   OB History    No data available     Review of Systems  Gastrointestinal: Positive for vomiting.  Genitourinary: Positive for flank pain.  All other systems reviewed and are negative.  Allergies  Sulfonamide derivatives  Home Medications   Prior to Admission medications   Medication Sig Start Date End Date Taking? Authorizing Provider   levothyroxine (SYNTHROID, LEVOTHROID) 75 MCG tablet Take 75 mcg by mouth daily before breakfast.    Historical Provider, MD  lisinopril (PRINIVIL,ZESTRIL) 40 MG tablet Take 40 mg by mouth daily.    Historical Provider, MD  norethindrone (AYGESTIN) 5 MG tablet Take 5 mg by mouth daily.    Historical Provider, MD  ondansetron (ZOFRAN ODT) 4 MG disintegrating tablet Take 1 tablet (4 mg total) by mouth every 8 (eight) hours as needed for nausea or vomiting. Patient not taking: Reported on 11/05/2014 01/29/14   [name], MD  oxyCODONE-acetaminophen (PERCOCET/ROXICET) 5-325 MG per tablet Take 1 tablet by mouth every 4 (four) hours as needed. Patient not taking: Reported on 11/05/2014 03/19/14   Carole Civil, MD  pantoprazole (PROTONIX) 40 MG tablet Take 1 tablet (40 mg total) by mouth daily. Patient not taking: Reported on 11/05/2014 09/15/12   Nita Sells, MD  traMADol (ULTRAM) 50 MG tablet Take 50-100 mg by mouth every 12 (twelve) hours as needed for moderate pain.     Historical Provider, MD   BP 171/95 mmHg  Pulse 91  Temp(Src) 97.9 F (36.6 C) (Oral)  Resp 22  Ht 5' 4"  (1.626 m)  Wt 270 lb (122.471 kg)  BMI 46.32 kg/m2  SpO2 98% Physical Exam  Constitutional: She is oriented to person, place, and time. She appears well-developed and well-nourished.  Appears to be in pain.   HENT:  Head: Normocephalic and atraumatic.  Eyes: Conjunctivae and EOM are normal. Pupils are equal, round, and reactive to light.  Neck: Normal range of motion. Neck supple. No JVD present.  Cardiovascular: Normal rate, regular rhythm and normal heart sounds.   No murmur heard. Pulmonary/Chest: Effort normal and breath sounds normal. She has no wheezes. She has no rales. She exhibits no tenderness.  Abdominal: She exhibits no distension and no mass. Bowel sounds are decreased. There is no rebound and no guarding.  Bilateral CVA tenderness. Bowel sounds decreased.   Musculoskeletal: Normal range of  motion. She exhibits edema.  1+ pitting edema bilaterally with venostasis changes bilaterally.   Neurological: She is alert and oriented to person, place, and time. No cranial nerve deficit. She exhibits normal muscle tone. Coordination normal.  Skin: Skin is warm and dry. No rash noted.  Psychiatric: She has a normal mood and affect. Her behavior is normal. Judgment and thought content normal.  Nursing note and vitals reviewed.  ED Course  Procedures (including critical care time) DIAGNOSTIC STUDIES: Oxygen Saturation is 98% on RA, normal by my interpretation.    COORDINATION OF CARE: 12:16 AM Discussed treatment plan with pt at bedside and pt agreed to plan.   Labs Review Results for orders placed or performed  during the hospital encounter of 04/14/15  Comprehensive metabolic panel  Result Value Ref Range   Sodium 138 135 - 145 mmol/L   Potassium 3.6 3.5 - 5.1 mmol/L   Chloride 107 101 - 111 mmol/L   CO2 27 22 - 32 mmol/L   Glucose, Bld 138 (H) 65 - 99 mg/dL   BUN 16 6 - 20 mg/dL   Creatinine, Ser 1.38 (H) 0.44 - 1.00 mg/dL   Calcium 8.3 (L) 8.9 - 10.3 mg/dL   Total Protein 7.2 6.5 - 8.1 g/dL   Albumin 3.2 (L) 3.5 - 5.0 g/dL   AST 38 15 - 41 U/L   ALT 38 14 - 54 U/L   Alkaline Phosphatase 93 38 - 126 U/L   Total Bilirubin 0.5 0.3 - 1.2 mg/dL   GFR calc non Af Amer 41 (L) >60 mL/min   GFR calc Af Amer 47 (L) >60 mL/min   Anion gap 4 (L) 5 - 15  Lipase, blood  Result Value Ref Range   Lipase 34 22 - 51 U/L  CBC with Differential  Result Value Ref Range   WBC 8.9 4.0 - 10.5 K/uL   RBC 4.57 3.87 - 5.11 MIL/uL   Hemoglobin 14.4 12.0 - 15.0 g/dL   HCT 42.1 36.0 - 46.0 %   MCV 92.1 78.0 - 100.0 fL   MCH 31.5 26.0 - 34.0 pg   MCHC 34.2 30.0 - 36.0 g/dL   RDW 13.3 11.5 - 15.5 %   Platelets 214 150 - 400 K/uL   Neutrophils Relative % 61 %   Neutro Abs 5.4 1.7 - 7.7 K/uL   Lymphocytes Relative 29 %   Lymphs Abs 2.6 0.7 - 4.0 K/uL   Monocytes Relative 8 %   Monocytes  Absolute 0.7 0.1 - 1.0 K/uL   Eosinophils Relative 2 %   Eosinophils Absolute 0.2 0.0 - 0.7 K/uL   Basophils Relative 0 %   Basophils Absolute 0.0 0.0 - 0.1 K/uL  Urinalysis, Routine w reflex microscopic  Result Value Ref Range   Color, Urine YELLOW YELLOW   APPearance HAZY (A) CLEAR   Specific Gravity, Urine 1.020 1.005 - 1.030   pH 6.0 5.0 - 8.0   Glucose, UA NEGATIVE NEGATIVE mg/dL   Hgb urine dipstick LARGE (A) NEGATIVE   Bilirubin Urine NEGATIVE NEGATIVE   Ketones, ur TRACE (A) NEGATIVE mg/dL   Protein, ur NEGATIVE NEGATIVE mg/dL   Urobilinogen, UA 0.2 0.0 - 1.0 mg/dL   Nitrite NEGATIVE NEGATIVE   Leukocytes, UA NEGATIVE NEGATIVE  Urine microscopic-add on  Result Value Ref Range   Squamous Epithelial / LPF MANY (A) RARE   WBC, UA 0-2 <3 WBC/hpf   RBC / HPF TOO NUMEROUS TO COUNT <3 RBC/hpf   Bacteria, UA MANY (A) RARE   Casts GRANULAR CAST (A) NEGATIVE   Crystals CA OXALATE CRYSTALS (A) NEGATIVE    Imaging Review Ct Renal Stone Study  04/15/2015   CLINICAL DATA:  Acute onset of right-sided flank pain and vomiting. Mild left flank pain. Initial encounter.  EXAM: CT ABDOMEN AND PELVIS WITHOUT CONTRAST  TECHNIQUE: Multidetector CT imaging of the abdomen and pelvis was performed following the standard protocol without IV contrast.  COMPARISON:  CT of the abdomen and pelvis from 11/18/2013  FINDINGS: Minimal bibasilar atelectasis is noted.  The liver and spleen are unremarkable in appearance. The gallbladder is within normal limits. The pancreas and adrenal glands are unremarkable.  There is mild right-sided hydronephrosis, with mild prominence of  the right ureter along its entire course. An obstructing 7 x 6 mm stone is noted at the distal right ureter, just above the right vesicoureteral junction. A 3 mm nonobstructing stone is noted at the upper pole of the left kidney. Mild nonspecific perinephric stranding is noted bilaterally.  No free fluid is identified. The small bowel is  unremarkable in appearance. The stomach is within normal limits. No acute vascular abnormalities are seen. Minimal calcification is noted along the abdominal aorta.  The appendix is not seen. There is no evidence for appendicitis. The colon is unremarkable in appearance.  The bladder is mildly distended and grossly unremarkable. The uterus is within normal limits. The ovaries are relatively symmetric. No suspicious adnexal masses are seen. No inguinal lymphadenopathy is seen.  No acute osseous abnormalities are identified.  IMPRESSION: 1. Mild right-sided hydronephrosis, with an obstructing 7 x 6 mm stone noted at the distal right ureter, just above the right vesicoureteral junction. 2. 3 mm nonobstructing stone at the upper pole of the left kidney.   Electronically Signed   By: Garald Balding M.D.   On: 04/15/2015 02:39   I have personally reviewed and evaluated these images and lab results as part of my medical decision-making.  MDM   Final diagnoses:  Right flank pain  Ureterolithiasis  Acute kidney injury (nontraumatic) (HCC)    Right flank pain consistent with ureteral colic. Old records reviewed and she did have a CT scan slightly more than a year ago showing a 4 mm right renal calculus. She will be sent for CT to look for evidence of ureteral calculus. She is given IV fluids, hydromorphone for pain, and ondansetron for nausea.  She required 3 doses of hydromorphone for adequate pain relief. CT shows a fairly large stone but right at the tip of the distal ureter. She is noted to have an acute rise in her creatinine. Case is discussed with Dr. Eulogio Ditch, on-call for urology. He states that he will get her in to his office in the next one-2 days. She is discharged with prescription for oxycodone-acetaminophen and tamsulosin. Return precautions given.  I, Sarina Robleto, personally performed the services described in this documentation. All medical record entries made by the scribe were at my  direction and in my presence.  I have reviewed the chart and discharge instructions and agree that the record reflects my personal performance and is accurate and complete. Waseem Suess.  04/15/2015. 12:55 AM.      Delora Fuel, MD 24/46/95 0722

## 2015-04-15 NOTE — Discharge Instructions (Signed)
Call the urologist at 8 AM. They will probably want to see you today or tomorrow. Return to the emergency department if pain is not being adequately controlled, or if you start running a fever.  Kidney Stones Kidney stones (urolithiasis) are deposits that form inside your kidneys. The intense pain is caused by the stone moving through the urinary tract. When the stone moves, the ureter goes into spasm around the stone. The stone is usually passed in the urine.  CAUSES   A disorder that makes certain neck glands produce too much parathyroid hormone (primary hyperparathyroidism).  A buildup of uric acid crystals, similar to gout in your joints.  Narrowing (stricture) of the ureter.  A kidney obstruction present at birth (congenital obstruction).  Previous surgery on the kidney or ureters.  Numerous kidney infections. SYMPTOMS   Feeling sick to your stomach (nauseous).  Throwing up (vomiting).  Blood in the urine (hematuria).  Pain that usually spreads (radiates) to the groin.  Frequency or urgency of urination. DIAGNOSIS   Taking a history and physical exam.  Blood or urine tests.  CT scan.  Occasionally, an examination of the inside of the urinary bladder (cystoscopy) is performed. TREATMENT   Observation.  Increasing your fluid intake.  Extracorporeal shock wave lithotripsy--This is a noninvasive procedure that uses shock waves to break up kidney stones.  Surgery may be needed if you have severe pain or persistent obstruction. There are various surgical procedures. Most of the procedures are performed with the use of small instruments. Only small incisions are needed to accommodate these instruments, so recovery time is minimized. The size, location, and chemical composition are all important variables that will determine the proper choice of action for you. Talk to your health care provider to better understand your situation so that you will minimize the risk of injury  to yourself and your kidney.  HOME CARE INSTRUCTIONS   Drink enough water and fluids to keep your urine clear or pale yellow. This will help you to pass the stone or stone fragments.  Strain all urine through the provided strainer. Keep all particulate matter and stones for your health care provider to see. The stone causing the pain may be as small as a grain of salt. It is very important to use the strainer each and every time you pass your urine. The collection of your stone will allow your health care provider to analyze it and verify that a stone has actually passed. The stone analysis will often identify what you can do to reduce the incidence of recurrences.  Only take over-the-counter or prescription medicines for pain, discomfort, or fever as directed by your health care provider.  Keep all follow-up visits as told by your health care provider. This is important.  Get follow-up X-rays if required. The absence of pain does not always mean that the stone has passed. It may have only stopped moving. If the urine remains completely obstructed, it can cause loss of kidney function or even complete destruction of the kidney. It is your responsibility to make sure X-rays and follow-ups are completed. Ultrasounds of the kidney can show blockages and the status of the kidney. Ultrasounds are not associated with any radiation and can be performed easily in a matter of minutes.  Make changes to your daily diet as told by your health care provider. You may be told to:  Limit the amount of salt that you eat.  Eat 5 or more servings of fruits and vegetables each  day.  Limit the amount of meat, poultry, fish, and eggs that you eat.  Collect a 24-hour urine sample as told by your health care provider.You may need to collect another urine sample every 6-12 months. SEEK MEDICAL CARE IF:  You experience pain that is progressive and unresponsive to any pain medicine you have been prescribed. SEEK  IMMEDIATE MEDICAL CARE IF:   Pain cannot be controlled with the prescribed medicine.  You have a fever or shaking chills.  The severity or intensity of pain increases over 18 hours and is not relieved by pain medicine.  You develop a new onset of abdominal pain.  You feel faint or pass out.  You are unable to urinate.   This information is not intended to replace advice given to you by your health care provider. Make sure you discuss any questions you have with your health care provider.   Document Released: 06/26/2005 Document Revised: 03/17/2015 Document Reviewed: 11/27/2012 Else[employer] Interactive Patient Education 2016 [employer]  Acetaminophen; Oxycodone tablets What is this medicine? ACETAMINOPHEN; OXYCODONE (a set a MEE noe fen; ox i KOE done) is a pain reliever. It is used to treat moderate to severe pain. This medicine may be used for other purposes; ask your health care provider or pharmacist if you have questions. What should I tell my health care provider before I take this medicine? They need to know if you have any of these conditions: -brain tumor -Crohn's disease, inflammatory bowel disease, or ulcerative colitis -drug abuse or addiction -head injury -heart or circulation problems -if you often drink alcohol -kidney disease or problems going to the bathroom -liver disease -lung disease, asthma, or breathing problems -an unusual or allergic reaction to acetaminophen, oxycodone, other opioid analgesics, other medicines, foods, dyes, or preservatives -pregnant or trying to get pregnant -breast-feeding How should I use this medicine? Take this medicine by mouth with a full glass of water. Follow the directions on the prescription label. You can take it with or without food. If it upsets your stomach, take it with food. Take your medicine at regular intervals. Do not take it more often than directed. Talk to your pediatrician regarding the use of this medicine in  children. Special care may be needed. Patients over 10 years old may have a stronger reaction and need a smaller dose. Overdosage: If you think you have taken too much of this medicine contact a poison control center or emergency room at once. NOTE: This medicine is only for you. Do not share this medicine with others. What if I miss a dose? If you miss a dose, take it as soon as you can. If it is almost time for your next dose, take only that dose. Do not take double or extra doses. What may interact with this medicine? -alcohol -antihistamines -barbiturates like amobarbital, butalbital, butabarbital, methohexital, pentobarbital, phenobarbital, thiopental, and secobarbital -benztropine -drugs for bladder problems like solifenacin, trospium, oxybutynin, tolterodine, hyoscyamine, and methscopolamine -drugs for breathing problems like ipratropium and tiotropium -drugs for certain stomach or intestine problems like propantheline, homatropine methylbromide, glycopyrrolate, atropine, belladonna, and dicyclomine -general anesthetics like etomidate, ketamine, nitrous oxide, propofol, desflurane, enflurane, halothane, isoflurane, and sevoflurane -medicines for depression, anxiety, or psychotic disturbances -medicines for sleep -muscle relaxants -naltrexone -narcotic medicines (opiates) for pain -phenothiazines like perphenazine, thioridazine, chlorpromazine, mesoridazine, fluphenazine, prochlorperazine, promazine, and trifluoperazine -scopolamine -tramadol -trihexyphenidyl This list may not describe all possible interactions. Give your health care provider a list of all the medicines, herbs, non-prescription drugs, or dietary  supplements you use. Also tell them if you smoke, drink alcohol, or use illegal drugs. Some items may interact with your medicine. What should I watch for while using this medicine? Tell your doctor or health care professional if your pain does not go away, if it gets worse,  or if you have new or a different type of pain. You may develop tolerance to the medicine. Tolerance means that you will need a higher dose of the medication for pain relief. Tolerance is normal and is expected if you take this medicine for a long time. Do not suddenly stop taking your medicine because you may develop a severe reaction. Your body becomes used to the medicine. This does NOT mean you are addicted. Addiction is a behavior related to getting and using a drug for a non-medical reason. If you have pain, you have a medical reason to take pain medicine. Your doctor will tell you how much medicine to take. If your doctor wants you to stop the medicine, the dose will be slowly lowered over time to avoid any side effects. You may get drowsy or dizzy. Do not drive, use machinery, or do anything that needs mental alertness until you know how this medicine affects you. Do not stand or sit up quickly, especially if you are an older patient. This reduces the risk of dizzy or fainting spells. Alcohol may interfere with the effect of this medicine. Avoid alcoholic drinks. There are different types of narcotic medicines (opiates) for pain. If you take more than one type at the same time, you may have more side effects. Give your health care provider a list of all medicines you use. Your doctor will tell you how much medicine to take. Do not take more medicine than directed. Call emergency for help if you have problems breathing. The medicine will cause constipation. Try to have a bowel movement at least every 2 to 3 days. If you do not have a bowel movement for 3 days, call your doctor or health care professional. Do not take Tylenol (acetaminophen) or medicines that have acetaminophen with this medicine. Too much acetaminophen can be very dangerous. Many nonprescription medicines contain acetaminophen. Always read the labels carefully to avoid taking more acetaminophen. What side effects may I notice from  receiving this medicine? Side effects that you should report to your doctor or health care professional as soon as possible: -allergic reactions like skin rash, itching or hives, swelling of the face, lips, or tongue -breathing difficulties, wheezing -confusion -light headedness or fainting spells -severe stomach pain -unusually weak or tired -yellowing of the skin or the whites of the eyes Side effects that usually do not require medical attention (report to your doctor or health care professional if they continue or are bothersome): -dizziness -drowsiness -nausea -vomiting This list may not describe all possible side effects. Call your doctor for medical advice about side effects. You may report side effects to FDA at 1-800-FDA-1088. Where should I keep my medicine? Keep out of the reach of children. This medicine can be abused. Keep your medicine in a safe place to protect it from theft. Do not share this medicine with anyone. Selling or giving away this medicine is dangerous and against the law. This medicine may cause accidental overdose and death if it taken by other adults, children, or pets. Mix any unused medicine with a substance like cat litter or coffee grounds. Then throw the medicine away in a sealed container like a sealed bag or a  coffee can with a lid. Do not use the medicine after the expiration date. Store at room temperature between 20 and 25 degrees C (68 and 77 degrees F). NOTE: This sheet is a summary. It may not cover all possible information. If you have questions about this medicine, talk to your doctor, pharmacist, or health care provider.    2016, [employer]. (2014-05-27 15:18:46)  Tamsulosin capsules What is this medicine? TAMSULOSIN (tam SOO loe sin) is used to treat enlargement of the prostate gland in men, a condition called benign prostatic hyperplasia or BPH. It is not for use in women. It works by relaxing muscles in the prostate and bladder  neck. This improves urine flow and reduces BPH symptoms. This medicine may be used for other purposes; ask your health care provider or pharmacist if you have questions. What should I tell my health care provider before I take this medicine? They need to know if you have any of the following conditions: -advanced kidney disease -advanced liver disease -low blood pressure -prostate cancer -an unusual or allergic reaction to tamsulosin, sulfa drugs, other medicines, foods, dyes, or preservatives -pregnant or trying to get pregnant -breast-feeding How should I use this medicine? Take this medicine by mouth about 30 minutes after the same meal every day. Follow the directions on the prescription label. Swallow the capsules whole with a glass of water. Do not crush, chew, or open capsules. Do not take your medicine more often than directed. Do not stop taking your medicine unless your doctor tells you to. Talk to your pediatrician regarding the use of this medicine in children. Special care may be needed. Overdosage: If you think you have taken too much of this medicine contact a poison control center or emergency room at once. NOTE: This medicine is only for you. Do not share this medicine with others. What if I miss a dose? If you miss a dose, take it as soon as you can. If it is almost time for your next dose, take only that dose. Do not take double or extra doses. If you stop taking your medicine for several days or more, ask your doctor or health care professional what dose you should start back on. What may interact with this medicine? -cimetidine -fluoxetine -ketoconazole -medicines for erectile disfunction like sildenafil, tadalafil, vardenafil -medicines for high blood pressure -other alpha-blockers like alfuzosin, doxazosin, phentolamine, phenoxybenzamine, prazosin, terazosin -warfarin This list may not describe all possible interactions. Give your health care provider a list of all  the medicines, herbs, non-prescription drugs, or dietary supplements you use. Also tell them if you smoke, drink alcohol, or use illegal drugs. Some items may interact with your medicine. What should I watch for while using this medicine? Visit your doctor or health care professional for regular check ups. You will need lab work done before you start this medicine and regularly while you are taking it. Check your blood pressure as directed. Ask your health care professional what your blood pressure should be, and when you should contact him or her. This medicine may make you feel dizzy or lightheaded. This is more likely to happen after the first dose, after an increase in dose, or during hot weather or exercise. Drinking alcohol and taking some medicines can make this worse. Do not drive, use machinery, or do anything that needs mental alertness until you know how this medicine affects you. Do not sit or stand up quickly. If you begin to feel dizzy, sit down until you  feel better. These effects can decrease once your body adjusts to the medicine. Contact your doctor or health care professional right away if you have an erection that lasts longer than 4 hours or if it becomes painful. This may be a sign of a serious problem and must be treated right away to prevent permanent damage. If you are thinking of having cataract surgery, tell your eye surgeon that you have taken this medicine. What side effects may I notice from receiving this medicine? Side effects that you should report to your doctor or health care professional as soon as possible: -allergic reactions like skin rash or itching, hives, swelling of the lips, mouth, tongue, or throat -breathing problems -change in vision -feeling faint or lightheaded -irregular heartbeat -prolonged or painful erection -weakness Side effects that usually do not require medical attention (report to your doctor or health care professional if they continue or are  bothersome): -back pain -change in sex drive or performance -constipation, nausea or vomiting -cough -drowsy -runny or stuffy nose -trouble sleeping This list may not describe all possible side effects. Call your doctor for medical advice about side effects. You may report side effects to FDA at 1-800-FDA-1088. Where should I keep my medicine? Keep out of the reach of children. Store at room temperature between 15 and 30 degrees C (59 and 86 degrees F). Throw away any unused medicine after the expiration date. NOTE: This sheet is a summary. It may not cover all possible information. If you have questions about this medicine, talk to your doctor, pharmacist, or health care provider.    2016, [employer]. (2012-06-26 14:11:34)

## 2015-04-20 MED FILL — Oxycodone w/ Acetaminophen Tab 5-325 MG: ORAL | Qty: 6 | Status: AC

## 2015-04-21 ENCOUNTER — Ambulatory Visit (INDEPENDENT_AMBULATORY_CARE_PROVIDER_SITE_OTHER): Payer: PRIVATE HEALTH INSURANCE | Admitting: Urology

## 2015-04-21 DIAGNOSIS — N200 Calculus of kidney: Secondary | ICD-10-CM

## 2015-04-21 DIAGNOSIS — N201 Calculus of ureter: Secondary | ICD-10-CM

## 2015-04-22 ENCOUNTER — Other Ambulatory Visit: Payer: Self-pay | Admitting: Urology

## 2015-04-26 ENCOUNTER — Other Ambulatory Visit (HOSPITAL_COMMUNITY): Payer: Self-pay

## 2015-04-28 ENCOUNTER — Ambulatory Visit: Admit: 2015-04-28 | Payer: Self-pay | Admitting: Urology

## 2015-04-28 SURGERY — CYSTOSCOPY, WITH RETROGRADE PYELOGRAM AND URETERAL STENT INSERTION
Anesthesia: General | Laterality: Right

## 2015-05-12 ENCOUNTER — Ambulatory Visit (INDEPENDENT_AMBULATORY_CARE_PROVIDER_SITE_OTHER): Payer: PRIVATE HEALTH INSURANCE | Admitting: Urology

## 2015-05-12 DIAGNOSIS — N200 Calculus of kidney: Secondary | ICD-10-CM | POA: Diagnosis not present

## 2015-05-12 DIAGNOSIS — N201 Calculus of ureter: Secondary | ICD-10-CM

## 2015-05-12 DIAGNOSIS — R3915 Urgency of urination: Secondary | ICD-10-CM

## 2015-06-30 ENCOUNTER — Other Ambulatory Visit: Payer: Self-pay | Admitting: Urology

## 2015-06-30 ENCOUNTER — Ambulatory Visit (INDEPENDENT_AMBULATORY_CARE_PROVIDER_SITE_OTHER): Payer: PRIVATE HEALTH INSURANCE | Admitting: Urology

## 2015-06-30 DIAGNOSIS — R8299 Other abnormal findings in urine: Secondary | ICD-10-CM

## 2015-06-30 DIAGNOSIS — N201 Calculus of ureter: Secondary | ICD-10-CM

## 2015-06-30 DIAGNOSIS — N200 Calculus of kidney: Secondary | ICD-10-CM

## 2015-07-09 ENCOUNTER — Other Ambulatory Visit (HOSPITAL_COMMUNITY): Payer: Self-pay | Admitting: Family Medicine

## 2015-07-09 ENCOUNTER — Ambulatory Visit (HOSPITAL_COMMUNITY)
Admission: RE | Admit: 2015-07-09 | Discharge: 2015-07-09 | Disposition: A | Discharge status: Home / Self Care | Payer: PRIVATE HEALTH INSURANCE | Source: Ambulatory Visit | Attending: Family Medicine | Admitting: Family Medicine

## 2015-07-09 DIAGNOSIS — R059 Cough, unspecified: Secondary | ICD-10-CM

## 2015-07-09 DIAGNOSIS — R05 Cough: Secondary | ICD-10-CM | POA: Diagnosis present

## 2015-09-29 ENCOUNTER — Ambulatory Visit (HOSPITAL_COMMUNITY)
Admission: RE | Admit: 2015-09-29 | Discharge: 2015-09-29 | Disposition: A | Discharge status: Home / Self Care | Payer: PRIVATE HEALTH INSURANCE | Source: Ambulatory Visit | Attending: Urology | Admitting: Urology

## 2015-09-29 DIAGNOSIS — N200 Calculus of kidney: Secondary | ICD-10-CM | POA: Diagnosis present

## 2015-10-06 ENCOUNTER — Ambulatory Visit (INDEPENDENT_AMBULATORY_CARE_PROVIDER_SITE_OTHER): Payer: PRIVATE HEALTH INSURANCE | Admitting: Urology

## 2015-10-06 ENCOUNTER — Other Ambulatory Visit: Payer: Self-pay | Admitting: Urology

## 2015-10-06 DIAGNOSIS — N200 Calculus of kidney: Secondary | ICD-10-CM

## 2015-10-06 DIAGNOSIS — R8299 Other abnormal findings in urine: Secondary | ICD-10-CM

## 2016-01-25 ENCOUNTER — Other Ambulatory Visit (HOSPITAL_COMMUNITY): Payer: Self-pay | Admitting: Pulmonary Disease

## 2016-01-25 DIAGNOSIS — J411 Mucopurulent chronic bronchitis: Secondary | ICD-10-CM

## 2016-02-04 ENCOUNTER — Ambulatory Visit (HOSPITAL_COMMUNITY)
Admission: RE | Admit: 2016-02-04 | Discharge: 2016-02-04 | Disposition: A | Discharge status: Home / Self Care | Payer: PRIVATE HEALTH INSURANCE | Source: Ambulatory Visit | Attending: Pulmonary Disease | Admitting: Pulmonary Disease

## 2016-02-04 DIAGNOSIS — R918 Other nonspecific abnormal finding of lung field: Secondary | ICD-10-CM | POA: Insufficient documentation

## 2016-02-04 DIAGNOSIS — K760 Fatty (change of) liver, not elsewhere classified: Secondary | ICD-10-CM | POA: Diagnosis not present

## 2016-02-04 DIAGNOSIS — J411 Mucopurulent chronic bronchitis: Secondary | ICD-10-CM

## 2016-02-12 ENCOUNTER — Other Ambulatory Visit: Payer: Self-pay | Admitting: Family Medicine

## 2016-02-12 DIAGNOSIS — Z1231 Encounter for screening mammogram for malignant neoplasm of breast: Secondary | ICD-10-CM

## 2016-02-15 ENCOUNTER — Other Ambulatory Visit (HOSPITAL_COMMUNITY): Payer: Self-pay | Admitting: Respiratory Therapy

## 2016-02-15 DIAGNOSIS — R0602 Shortness of breath: Secondary | ICD-10-CM

## 2016-02-23 ENCOUNTER — Ambulatory Visit (HOSPITAL_COMMUNITY)
Admission: RE | Admit: 2016-02-23 | Discharge: 2016-02-23 | Disposition: A | Discharge status: Home / Self Care | Payer: PRIVATE HEALTH INSURANCE | Source: Ambulatory Visit | Attending: Pulmonary Disease | Admitting: Pulmonary Disease

## 2016-02-23 DIAGNOSIS — J984 Other disorders of lung: Secondary | ICD-10-CM | POA: Diagnosis not present

## 2016-02-23 DIAGNOSIS — R0602 Shortness of breath: Secondary | ICD-10-CM | POA: Diagnosis not present

## 2016-02-23 MED ORDER — ALBUTEROL SULFATE (2.5 MG/3ML) 0.083% IN NEBU
2.5000 mg | INHALATION_SOLUTION | Freq: Once | RESPIRATORY_TRACT | Status: AC
Start: 2016-02-23 — End: 2016-02-23
  Administered 2016-02-23: 2.5 mg via RESPIRATORY_TRACT

## 2016-02-28 LAB — PULMONARY FUNCTION TEST
DL/VA % pred: 135 %
DL/VA: 6.5 ml/min/mmHg/L
DLCO UNC % PRED: 71 %
DLCO unc: 17.28 ml/min/mmHg
FEF 25-75 PRE: 3.26 L/s
FEF 25-75 Post: 4.06 L/sec
FEF2575-%Change-Post: 24 %
FEF2575-%PRED-PRE: 143 %
FEF2575-%Pred-Post: 178 %
FEV1-%Change-Post: 4 %
FEV1-%PRED-POST: 73 %
FEV1-%Pred-Pre: 70 %
FEV1-POST: 1.85 L
FEV1-Pre: 1.78 L
FEV1FVC-%Change-Post: 0 %
FEV1FVC-%Pred-Pre: 119 %
FEV6-%CHANGE-POST: 3 %
FEV6-%PRED-POST: 62 %
FEV6-%PRED-PRE: 60 %
FEV6-POST: 1.97 L
FEV6-PRE: 1.91 L
FEV6FVC-%PRED-POST: 104 %
FEV6FVC-%PRED-PRE: 104 %
FVC-%Change-Post: 3 %
FVC-%Pred-Post: 60 %
FVC-%Pred-Pre: 58 %
FVC-Post: 1.97 L
FVC-Pre: 1.91 L
POST FEV6/FVC RATIO: 100 %
PRE FEV6/FVC RATIO: 100 %
Post FEV1/FVC ratio: 94 %
Pre FEV1/FVC ratio: 93 %
RV % PRED: 165 %
RV: 3.33 L
TLC % PRED: 120 %
TLC: 6.1 L

## 2016-03-27 ENCOUNTER — Ambulatory Visit (HOSPITAL_COMMUNITY)
Admission: RE | Admit: 2016-03-27 | Discharge: 2016-03-27 | Disposition: A | Discharge status: Home / Self Care | Payer: PRIVATE HEALTH INSURANCE | Source: Ambulatory Visit | Attending: Urology | Admitting: Urology

## 2016-03-27 DIAGNOSIS — N200 Calculus of kidney: Secondary | ICD-10-CM | POA: Diagnosis not present

## 2016-04-05 ENCOUNTER — Ambulatory Visit (INDEPENDENT_AMBULATORY_CARE_PROVIDER_SITE_OTHER): Payer: PRIVATE HEALTH INSURANCE | Admitting: Urology

## 2016-04-05 DIAGNOSIS — R1084 Generalized abdominal pain: Secondary | ICD-10-CM

## 2016-04-05 DIAGNOSIS — N200 Calculus of kidney: Secondary | ICD-10-CM | POA: Diagnosis not present

## 2016-04-11 ENCOUNTER — Other Ambulatory Visit: Payer: Self-pay | Admitting: Urology

## 2016-04-11 DIAGNOSIS — N200 Calculus of kidney: Secondary | ICD-10-CM

## 2016-04-18 ENCOUNTER — Ambulatory Visit (HOSPITAL_COMMUNITY)
Admission: RE | Admit: 2016-04-18 | Discharge: 2016-04-18 | Disposition: A | Discharge status: Home / Self Care | Payer: PRIVATE HEALTH INSURANCE | Source: Ambulatory Visit | Attending: Family Medicine | Admitting: Family Medicine

## 2016-04-18 ENCOUNTER — Other Ambulatory Visit (HOSPITAL_COMMUNITY): Payer: Self-pay | Admitting: Family Medicine

## 2016-04-18 DIAGNOSIS — M25512 Pain in left shoulder: Secondary | ICD-10-CM

## 2016-04-18 DIAGNOSIS — M25532 Pain in left wrist: Secondary | ICD-10-CM

## 2016-04-24 ENCOUNTER — Ambulatory Visit (HOSPITAL_COMMUNITY)
Admission: RE | Admit: 2016-04-24 | Discharge: 2016-04-24 | Disposition: A | Discharge status: Home / Self Care | Payer: PRIVATE HEALTH INSURANCE | Source: Ambulatory Visit | Attending: Urology | Admitting: Urology

## 2016-04-24 DIAGNOSIS — I700 Atherosclerosis of aorta: Secondary | ICD-10-CM | POA: Diagnosis not present

## 2016-04-24 DIAGNOSIS — N200 Calculus of kidney: Secondary | ICD-10-CM | POA: Insufficient documentation

## 2016-07-10 DIAGNOSIS — R7989 Other specified abnormal findings of blood chemistry: Secondary | ICD-10-CM

## 2016-07-10 HISTORY — DX: Other specified abnormal findings of blood chemistry: R79.89

## 2016-08-25 ENCOUNTER — Other Ambulatory Visit: Payer: Self-pay | Admitting: Urology

## 2016-08-25 DIAGNOSIS — N200 Calculus of kidney: Secondary | ICD-10-CM

## 2016-09-04 ENCOUNTER — Ambulatory Visit (HOSPITAL_COMMUNITY)
Admission: RE | Admit: 2016-09-04 | Discharge: 2016-09-04 | Disposition: A | Discharge status: Home / Self Care | Payer: Managed Care, Other (non HMO) | Source: Ambulatory Visit | Attending: Urology | Admitting: Urology

## 2016-09-04 DIAGNOSIS — N200 Calculus of kidney: Secondary | ICD-10-CM | POA: Diagnosis present

## 2016-09-08 ENCOUNTER — Ambulatory Visit (HOSPITAL_COMMUNITY)
Admission: RE | Admit: 2016-09-08 | Discharge: 2016-09-08 | Disposition: A | Discharge status: Home / Self Care | Payer: Managed Care, Other (non HMO) | Source: Ambulatory Visit | Attending: Registered Nurse | Admitting: Registered Nurse

## 2016-09-08 ENCOUNTER — Other Ambulatory Visit (HOSPITAL_COMMUNITY): Payer: Self-pay | Admitting: Registered Nurse

## 2016-09-08 DIAGNOSIS — M25572 Pain in left ankle and joints of left foot: Secondary | ICD-10-CM | POA: Diagnosis not present

## 2016-09-08 DIAGNOSIS — K115 Sialolithiasis: Secondary | ICD-10-CM | POA: Diagnosis not present

## 2016-09-08 DIAGNOSIS — E039 Hypothyroidism, unspecified: Secondary | ICD-10-CM | POA: Diagnosis not present

## 2016-09-08 DIAGNOSIS — M79672 Pain in left foot: Secondary | ICD-10-CM

## 2016-09-08 DIAGNOSIS — Z8781 Personal history of (healed) traumatic fracture: Secondary | ICD-10-CM | POA: Insufficient documentation

## 2016-11-07 ENCOUNTER — Other Ambulatory Visit (HOSPITAL_COMMUNITY): Payer: Self-pay | Admitting: Family Medicine

## 2016-11-07 DIAGNOSIS — R221 Localized swelling, mass and lump, neck: Secondary | ICD-10-CM

## 2016-11-30 ENCOUNTER — Other Ambulatory Visit (HOSPITAL_COMMUNITY): Payer: Self-pay | Admitting: Family Medicine

## 2016-11-30 DIAGNOSIS — R221 Localized swelling, mass and lump, neck: Secondary | ICD-10-CM

## 2016-12-07 ENCOUNTER — Ambulatory Visit (HOSPITAL_COMMUNITY)
Admission: RE | Admit: 2016-12-07 | Discharge: 2016-12-07 | Disposition: A | Discharge status: Home / Self Care | Payer: Managed Care, Other (non HMO) | Source: Ambulatory Visit | Attending: Family Medicine | Admitting: Family Medicine

## 2016-12-07 DIAGNOSIS — R221 Localized swelling, mass and lump, neck: Secondary | ICD-10-CM | POA: Diagnosis present

## 2016-12-19 ENCOUNTER — Other Ambulatory Visit (HOSPITAL_COMMUNITY): Payer: Self-pay | Admitting: Family Medicine

## 2016-12-19 DIAGNOSIS — Z1231 Encounter for screening mammogram for malignant neoplasm of breast: Secondary | ICD-10-CM

## 2016-12-28 ENCOUNTER — Ambulatory Visit (HOSPITAL_COMMUNITY)
Admission: RE | Admit: 2016-12-28 | Discharge: 2016-12-28 | Disposition: A | Discharge status: Home / Self Care | Payer: Managed Care, Other (non HMO) | Source: Ambulatory Visit | Attending: Family Medicine | Admitting: Family Medicine

## 2016-12-28 DIAGNOSIS — Z1231 Encounter for screening mammogram for malignant neoplasm of breast: Secondary | ICD-10-CM | POA: Diagnosis present

## 2017-02-14 ENCOUNTER — Ambulatory Visit (INDEPENDENT_AMBULATORY_CARE_PROVIDER_SITE_OTHER): Payer: Managed Care, Other (non HMO) | Admitting: Urology

## 2017-02-14 DIAGNOSIS — R1084 Generalized abdominal pain: Secondary | ICD-10-CM

## 2017-02-14 DIAGNOSIS — N200 Calculus of kidney: Secondary | ICD-10-CM

## 2017-02-14 DIAGNOSIS — R351 Nocturia: Secondary | ICD-10-CM | POA: Diagnosis not present

## 2017-02-26 ENCOUNTER — Encounter: Payer: Self-pay | Admitting: Psychology

## 2017-03-01 ENCOUNTER — Encounter: Payer: Self-pay | Admitting: Gastroenterology

## 2017-03-07 ENCOUNTER — Ambulatory Visit (INDEPENDENT_AMBULATORY_CARE_PROVIDER_SITE_OTHER): Payer: Managed Care, Other (non HMO) | Admitting: Obstetrics and Gynecology

## 2017-03-07 ENCOUNTER — Encounter: Payer: Self-pay | Admitting: Obstetrics and Gynecology

## 2017-03-07 VITALS — BP 126/70 | HR 88 | Resp 16 | Ht 63.5 in | Wt 272.0 lb

## 2017-03-07 DIAGNOSIS — R109 Unspecified abdominal pain: Secondary | ICD-10-CM

## 2017-03-07 DIAGNOSIS — N949 Unspecified condition associated with female genital organs and menstrual cycle: Secondary | ICD-10-CM

## 2017-03-07 LAB — POCT URINALYSIS DIPSTICK
BILIRUBIN UA: NEGATIVE
Glucose, UA: NEGATIVE
KETONES UA: NEGATIVE
Nitrite, UA: NEGATIVE
Protein, UA: NEGATIVE
Urobilinogen, UA: 0.2 E.U./dL
pH, UA: 7 (ref 5.0–8.0)

## 2017-03-07 NOTE — Progress Notes (Signed)
GYNECOLOGY  VISIT   HPI: 61 y.o.   Divorced  Caucasian  female   G2P0002 with Patient's last menstrual period was 07/11/2007 (approximate).   here for vaginal prolapse; patient also complains of having spasms in abdomen.  Referred by Dr. Sharilyn Sites.  Having prolapse for about one month.  Worse with standing and lying down. Feels a dry pinching.  No problems voiding. Gets relief with voiding.  No splinting to have BMS.  No urinary or fecal incontinence.  Not sexually active currently.  No partner for 30+ years.  Hx nephrolithiasis and gets them often. Saw urologist, Dr. Alyson Ingles, in the last month.  Urine: trace WBC, trace RBC, 7.0 pH  GYNECOLOGIC HISTORY: Patient's last menstrual period was 07/11/2007 (approximate). Contraception:  Postmenopausal Menopausal hormone therapy:  none Last mammogram:  12/28/16 BIRADS 1 negative/density b Last pap smear:   2017 per patient done with PCP        OB History    Gravida Para Term Preterm AB Living   2 2 0 0 0 2   SAB TAB Ectopic Multiple Live Births   0 0 0 0 0         Patient Active Problem List   Diagnosis Date Noted  . Abdominal pain 12/08/2014  . Postmenopausal bleeding 11/18/2014  . Closed fracture of distal clavicle 04/17/2014  . Muscle weakness (generalized) 03/26/2014  . Pain in joint, shoulder region 03/26/2014  . Decreased range of motion of right shoulder 03/26/2014  . Right clavicle fracture 03/19/2014  . Fatty liver 03/17/2014  . Rectal bleeding 03/17/2014  . Dyspepsia 03/11/2014  . Hypothyroidism 09/11/2012  . ADHD (attention deficit hyperactivity disorder) 09/11/2012  . Anxiety 09/11/2012  . GERD 12/21/2009  . IRRITABLE BOWEL SYNDROME 04/22/2009  . HYPERLIPIDEMIA 07/09/2008  . DEPRESSION 07/09/2008  . HYPERTENSION 07/09/2008  . Gastritis and gastroduodenitis 07/09/2008  . SLEEP APNEA 07/09/2008    Past Medical History:  Diagnosis Date  . Abnormal uterine bleeding   . Anxiety   . Arthritis   .  Calculus of kidney 07/09/2008   Qualifier: Diagnosis of  By: Kellie Simmering LPN, Almyra Free    . Chronic abdominal pain   . Chronic pain in left foot   . Clotting disorder ([place])   . Depression   . Endometriosis   . Fatty liver   . Fibromyalgia   . Gastritis   . GERD 12/21/2009   Qualifier: Diagnosis of  By: Craige Cotta    . Hypertension    stopped meds in Aug 2015  . Hypothyroidism   . IBS (irritable bowel syndrome)   . Internal hemorrhoids   . Kidney stone   . Obesity, morbid ([place]) 09/11/2012  . PE 07/09/2008   Qualifier: Diagnosis of  By: Kellie Simmering LPN, Almyra Free    . PE (pulmonary embolism)   . PONV (postoperative nausea and vomiting)   . Sleep apnea    hasn't worn a CPAP in 6 months  . Thyroid disease   . Vitamin D deficiency     Past Surgical History:  Procedure Laterality Date  . ABDOMINAL SURGERY     laparoscopy  . BIOPSY N/A 03/24/2014   Procedure: GASTRIC BIOPSIES;  Surgeon: Danie Binder, MD;  Location: [facility];  Service: Endoscopy;  Laterality: N/A;  . BREAST LUMPECTOMY    . CESAREAN SECTION     X2  . COLONOSCOPY  2006   internal hemorrhoids  . COLONOSCOPY WITH PROPOFOL N/A 03/24/2014   Dr. [name]: 2 tubular adenomas removed, hemorrhoids  .  CYSTOSCOPY W/ RETROGRADES  01/23/2012   Procedure: CYSTOSCOPY WITH RETROGRADE PYELOGRAM;  Surgeon: Marissa Nestle, MD;  Location: [facility];  Service: Urology;  Laterality: Left;  . DILATATION & CURETTAGE/HYSTEROSCOPY WITH MYOSURE N/A 11/18/2014   Procedure: DILATATION & CURETTAGE/HYSTEROSCOPY WITH MYOSURE, resection of polyp;  Surgeon: Cheri Fowler, MD;  Location: [facility];  Service: Gynecology;  Laterality: N/A;  . DILATION AND CURETTAGE OF UTERUS    . ESOPHAGOGASTRODUODENOSCOPY   08/24/2006   Dr. Veto Kemps erythema of the antrum without erosion or ulcers/Otherwise, normal esophagus without evidence of Barrett's path with chronic gastritis  . ESOPHAGOGASTRODUODENOSCOPY (EGD) WITH PROPOFOL N/A 03/24/2014   Dr. [name]: gastritis  .  POLYPECTOMY N/A 03/24/2014   Procedure: POLYPECTOMY;  Surgeon: Danie Binder, MD;  Location: [facility];  Service: Endoscopy;  Laterality: N/A;  . REMOVAL OF STONES  01/23/2012   Procedure: REMOVAL OF STONES;  Surgeon: Marissa Nestle, MD;  Location: [facility];  Service: Urology;  Laterality: N/A;  . TUBAL LIGATION      Current Outpatient Prescriptions  Medication Sig Dispense Refill  . DULoxetine (CYMBALTA) 30 MG capsule Take 1 capsule by mouth daily.    . ergocalciferol (VITAMIN D2) 50000 units capsule Take 50,000 Units by mouth once a week.    Marland Kitchen HYDROcodone-acetaminophen (NORCO/VICODIN) 5-325 MG tablet Take 1 tablet by mouth as needed for moderate pain.    . Levothyroxine Sodium 100 MCG CAPS Take 75 mcg by mouth daily before breakfast.    . olmesartan-hydrochlorothiazide (BENICAR HCT) 40-25 MG tablet Take 1 tablet by mouth daily.    Glory Rosebush DELICA LANCETS 24M MISC     . ONETOUCH VERIO test strip     . oxyCODONE-acetaminophen (PERCOCET) 5-325 MG tablet Take 1-2 tablets by mouth every 4 (four) hours as needed for moderate pain. 20 tablet 0  . prednisoLONE acetate (PRED FORTE) 1 % ophthalmic suspension     . RESTASIS MULTIDOSE 0.05 % ophthalmic emulsion Apply 1 drop to eye 2 (two) times daily.    . tamsulosin (FLOMAX) 0.4 MG CAPS capsule Take 1 capsule (0.4 mg total) by mouth daily. 7 capsule 0  . traMADol (ULTRAM) 50 MG tablet Take 50-100 mg by mouth every 12 (twelve) hours as needed for moderate pain.      No current facility-administered medications for this visit.      ALLERGIES: Azithromycin; Other; and Sulfonamide derivatives  Family History  Problem Relation Age of Onset  . Heart attack Mother        CABG  . Stroke Mother   . Hypertension Mother   . Cancer Mother   . Diabetes Mother   . Heart attack Father        CABG  . Hypertension Father   . Mesothelioma Father   . Heart attack Brother   . Diabetes Brother   . Depression Brother   . Hyperlipidemia Brother   .  Diabetes Maternal Grandmother   . Diabetes Maternal Grandfather   . Cancer Paternal Grandmother   . Diabetes Brother   . Hyperlipidemia Brother   . Colon cancer Neg Hx     Social History   Social History  . Marital status: Divorced    Spouse name: N/A  . Number of children: N/A  . Years of education: N/A   Occupational History  . [employer] features editor   Social History Main Topics  . Smoking status: Former Smoker    Types: Cigarettes  . Smokeless tobacco: Never Used  .  Alcohol use Yes     Comment: ocassionally  . Drug use: No  . Sexual activity: No   Other Topics Concern  . Not on file   Social History Narrative  . No narrative on file    ROS:  Pertinent items are noted in HPI.  PHYSICAL EXAMINATION:    BP 126/70 (BP Location: Right Arm, Patient Position: Sitting, Cuff Size: Normal)   Pulse 88   Resp 16   Ht 5' 3.5" (1.613 m)   Wt 272 lb (123.4 kg)   LMP 07/11/2007 (Approximate)   BMI 47.43 kg/m     General appearance: alert, cooperative and appears stated age Head: Normocephalic,  Left cervical soft mass - 3 cm.  Neck: no adenopathy, supple, symmetrical, trachea midline and thyroid normal to inspection and palpation Lungs: clear to auscultation bilaterally Heart: regular rate and rhythm Abdomen: obese, soft, non-tender, no masses,  no organomegaly Extremities:  LES with tannish coloration change of skin.   Some skin breakdown.  Skin: Skin color, texture, turgor normal. No rashes or lesions No abnormal inguinal nodes palpated Neurologic: Grossly normal  Pelvic: External genitalia:  no lesions              Urethra:  normal appearing urethra with no masses, tenderness or lesions              Bartholins and Skenes: normal                 Vagina: normal appearing vagina with normal color and discharge, no lesions              Cervix: no lesions                Bimanual Exam:  Uterus:  normal size, contour, position, consistency,  mobility, non-tender - BM exam limited by body habitus.              Adnexa: no mass, fullness, tenderness              Rectal exam: Yes.  .  Confirms.              Anus:  normal sphincter tone, no lesions   Chaperone was present for exam.  ASSESSMENT  Vaginal discomfort.  No obvious prolapse on exam.  Hx of hysteroscopic resection of polyp.  Morbid obesity.  Venous stasis.   PLAN  I discussed signs and symptoms of pelvic organ prolapse, which I cannot confirm on physical exam today.  Urine micro and culture.  Affirm.  Return for pelvic ultrasound.  I encouraged a visit with a vascular surgeon or specialist for her legs.  She will pursue this through her PCP.    An After Visit Summary was printed and given to the patient.  __30____ minutes face to face time of which over 50% was spent in counseling.

## 2017-03-08 ENCOUNTER — Telehealth: Payer: Self-pay | Admitting: Obstetrics and Gynecology

## 2017-03-08 LAB — URINALYSIS, MICROSCOPIC ONLY: CASTS: NONE SEEN /LPF

## 2017-03-08 LAB — VAGINITIS/VAGINOSIS, DNA PROBE
Candida Species: NEGATIVE
GARDNERELLA VAGINALIS: NEGATIVE
TRICHOMONAS VAG: NEGATIVE

## 2017-03-08 LAB — URINE CULTURE

## 2017-03-08 NOTE — Telephone Encounter (Signed)
Spoke with patient regarding benefit for an ultrasound. Patient understood and agreeable. Patient states, she thought the necessity of scheduling an ultrasound would be determined by lab results. Patient would like to know if lab results are available and states will schedule pending the lab results.   Routing to Dr Quincy Simmonds for review  cc: Triage

## 2017-03-08 NOTE — Telephone Encounter (Signed)
Spoke with patient, advised of results as seen below per Dr. Quincy Simmonds. Patient scheduled for PUS on 04/05/17 at 9am with consult to follow at 9:30pm with Dr. Quincy Simmonds. Patient states Dr. Quincy Simmonds was awaiting lab results prior to scheduling PUS. Advised patient to keep PUS as scheduled, will advise with any additional recommendations once remaining labs have resulted, will return call. Patient verbalizes understanding and is agreeable.  Notes recorded by Nunzio Cobbs, MD on 03/08/2017 at 10:12 AM EDT Please report negative vaginitis panel to patient.  Her urine is showing calcium oxylate crystals which can be seen with kidney stones.  Her final urine culture is pending.   Routing to provider for final review. Patient is agreeable to disposition. Will close encounter.  Cc: Lerry Liner

## 2017-03-11 ENCOUNTER — Encounter: Payer: Self-pay | Admitting: Obstetrics and Gynecology

## 2017-03-29 ENCOUNTER — Ambulatory Visit (INDEPENDENT_AMBULATORY_CARE_PROVIDER_SITE_OTHER): Payer: Managed Care, Other (non HMO) | Admitting: Otolaryngology

## 2017-03-29 DIAGNOSIS — D37032 Neoplasm of uncertain behavior of the submandibular salivary glands: Secondary | ICD-10-CM

## 2017-03-29 DIAGNOSIS — K219 Gastro-esophageal reflux disease without esophagitis: Secondary | ICD-10-CM | POA: Diagnosis not present

## 2017-03-29 DIAGNOSIS — R1312 Dysphagia, oropharyngeal phase: Secondary | ICD-10-CM

## 2017-03-30 ENCOUNTER — Other Ambulatory Visit (INDEPENDENT_AMBULATORY_CARE_PROVIDER_SITE_OTHER): Payer: Self-pay | Admitting: Otolaryngology

## 2017-03-30 DIAGNOSIS — R1310 Dysphagia, unspecified: Secondary | ICD-10-CM

## 2017-04-05 ENCOUNTER — Encounter: Payer: Self-pay | Admitting: Obstetrics and Gynecology

## 2017-04-05 ENCOUNTER — Ambulatory Visit (INDEPENDENT_AMBULATORY_CARE_PROVIDER_SITE_OTHER): Payer: Managed Care, Other (non HMO)

## 2017-04-05 ENCOUNTER — Ambulatory Visit (INDEPENDENT_AMBULATORY_CARE_PROVIDER_SITE_OTHER): Payer: Managed Care, Other (non HMO) | Admitting: Obstetrics and Gynecology

## 2017-04-05 VITALS — BP 124/84 | HR 84 | Ht 63.5 in | Wt 272.0 lb

## 2017-04-05 DIAGNOSIS — R102 Pelvic and perineal pain: Secondary | ICD-10-CM

## 2017-04-05 DIAGNOSIS — R109 Unspecified abdominal pain: Secondary | ICD-10-CM

## 2017-04-05 DIAGNOSIS — N949 Unspecified condition associated with female genital organs and menstrual cycle: Secondary | ICD-10-CM

## 2017-04-05 NOTE — Patient Instructions (Signed)
I am please that your pelvic ultrasound is normal.  Please contact me if you need anything in the future!

## 2017-04-05 NOTE — Progress Notes (Signed)
Patient ID: Yolanda [name], female   DOB: 05/05/1956, 61 y.o.   MRN: [id number] GYNECOLOGY  VISIT   HPI: 61 y.o.   Divorced  Caucasian  female   G2P0002 with Patient's last menstrual period was 07/11/2007 (approximate).   here for pelvic ultrasound for vaginal discomfort and limited pelvic exam.    States that her vaginal pain has essentially resolved.  No pain for one week.  Last time it lasted for a few hours.  Had a negative urine culture and negative vaginitis evaluation.   Abdominal spasms come and go.  No pain now.  States she has kidney stones several times a year.  ROS negative today. PCP - Dr. Hilma Favors.   GYNECOLOGIC HISTORY: Patient's last menstrual period was 07/11/2007 (approximate). Contraception:  Postmenopausal Menopausal hormone therapy:  none Last mammogram:  12/28/16 BIRADS 1 negative/density b Last pap smear:  2017 per patient done with PCP        OB History    Gravida Para Term Preterm AB Living   2 2 0 0 0 2   SAB TAB Ectopic Multiple Live Births   0 0 0 0 0         Patient Active Problem List   Diagnosis Date Noted  . Abdominal pain 12/08/2014  . Postmenopausal bleeding 11/18/2014  . Closed fracture of distal clavicle 04/17/2014  . Muscle weakness (generalized) 03/26/2014  . Pain in joint, shoulder region 03/26/2014  . Decreased range of motion of right shoulder 03/26/2014  . Right clavicle fracture 03/19/2014  . Fatty liver 03/17/2014  . Rectal bleeding 03/17/2014  . Dyspepsia 03/11/2014  . Hypothyroidism 09/11/2012  . ADHD (attention deficit hyperactivity disorder) 09/11/2012  . Anxiety 09/11/2012  . GERD 12/21/2009  . IRRITABLE BOWEL SYNDROME 04/22/2009  . HYPERLIPIDEMIA 07/09/2008  . DEPRESSION 07/09/2008  . HYPERTENSION 07/09/2008  . Gastritis and gastroduodenitis 07/09/2008  . SLEEP APNEA 07/09/2008    Past Medical History:  Diagnosis Date  . Abnormal uterine bleeding   . Anxiety   . Arthritis   . Calculus of kidney  07/09/2008   Qualifier: Diagnosis of  By: Kellie Simmering LPN, Almyra Free    . Chronic abdominal pain   . Chronic pain in left foot   . Clotting disorder ([place])   . Depression   . Elevated liver function tests 2018  . Endometriosis   . Fatty liver   . Fibromyalgia   . Gastritis   . GERD 12/21/2009   Qualifier: Diagnosis of  By: Craige Cotta    . Hypertension    stopped meds in Aug 2015  . Hypothyroidism   . IBS (irritable bowel syndrome)   . Internal hemorrhoids   . Kidney stone   . Obesity, morbid ([place]) 09/11/2012  . PE 07/09/2008   Qualifier: Diagnosis of  By: Kellie Simmering LPN, Almyra Free    . PE (pulmonary embolism)   . PONV (postoperative nausea and vomiting)   . Sleep apnea    hasn't worn a CPAP in 6 months  . Thyroid disease   . Vitamin D deficiency     Past Surgical History:  Procedure Laterality Date  . ABDOMINAL SURGERY     laparoscopy  . BIOPSY N/A 03/24/2014   Procedure: GASTRIC BIOPSIES;  Surgeon: Danie Binder, MD;  Location: [facility];  Service: Endoscopy;  Laterality: N/A;  . BREAST LUMPECTOMY    . CESAREAN SECTION     X2  . COLONOSCOPY  2006   internal hemorrhoids  . COLONOSCOPY WITH PROPOFOL N/A 03/24/2014  Dr. [name]: 2 tubular adenomas removed, hemorrhoids  . CYSTOSCOPY W/ RETROGRADES  01/23/2012   Procedure: CYSTOSCOPY WITH RETROGRADE PYELOGRAM;  Surgeon: Marissa Nestle, MD;  Location: [facility];  Service: Urology;  Laterality: Left;  . DILATATION & CURETTAGE/HYSTEROSCOPY WITH MYOSURE N/A 11/18/2014   Procedure: DILATATION & CURETTAGE/HYSTEROSCOPY WITH MYOSURE, resection of polyp;  Surgeon: Cheri Fowler, MD;  Location: [facility];  Service: Gynecology;  Laterality: N/A;  . DILATION AND CURETTAGE OF UTERUS    . ESOPHAGOGASTRODUODENOSCOPY   08/24/2006   Dr. Veto Kemps erythema of the antrum without erosion or ulcers/Otherwise, normal esophagus without evidence of Barrett's path with chronic gastritis  . ESOPHAGOGASTRODUODENOSCOPY (EGD) WITH PROPOFOL N/A 03/24/2014   Dr. [name]:  gastritis  . POLYPECTOMY N/A 03/24/2014   Procedure: POLYPECTOMY;  Surgeon: Danie Binder, MD;  Location: [facility];  Service: Endoscopy;  Laterality: N/A;  . REMOVAL OF STONES  01/23/2012   Procedure: REMOVAL OF STONES;  Surgeon: Marissa Nestle, MD;  Location: [facility];  Service: Urology;  Laterality: N/A;  . TUBAL LIGATION      Current Outpatient Prescriptions  Medication Sig Dispense Refill  . DULoxetine (CYMBALTA) 30 MG capsule Take 1 capsule by mouth daily.    . ergocalciferol (VITAMIN D2) 50000 units capsule Take 50,000 Units by mouth once a week.    Marland Kitchen HYDROcodone-acetaminophen (NORCO/VICODIN) 5-325 MG tablet Take 1 tablet by mouth as needed for moderate pain.    . Levothyroxine Sodium 100 MCG CAPS Take 75 mcg by mouth daily before breakfast.    . olmesartan-hydrochlorothiazide (BENICAR HCT) 40-25 MG tablet Take 1 tablet by mouth daily.    Marland Kitchen omeprazole (PRILOSEC) 20 MG capsule Take 1 capsule by mouth daily.    Glory Rosebush DELICA LANCETS 64P MISC     . ONETOUCH VERIO test strip     . oxyCODONE-acetaminophen (PERCOCET) 5-325 MG tablet Take 1-2 tablets by mouth every 4 (four) hours as needed for moderate pain. 20 tablet 0  . prednisoLONE acetate (PRED FORTE) 1 % ophthalmic suspension     . RESTASIS MULTIDOSE 0.05 % ophthalmic emulsion Apply 1 drop to eye 2 (two) times daily.    . tamsulosin (FLOMAX) 0.4 MG CAPS capsule Take 1 capsule (0.4 mg total) by mouth daily. 7 capsule 0  . traMADol (ULTRAM) 50 MG tablet Take 50-100 mg by mouth every 12 (twelve) hours as needed for moderate pain.      No current facility-administered medications for this visit.      ALLERGIES: Azithromycin; Other; and Sulfonamide derivatives  Family History  Problem Relation Age of Onset  . Heart attack Mother        CABG  . Stroke Mother   . Hypertension Mother   . Cancer Mother   . Diabetes Mother   . Heart attack Father        CABG  . Hypertension Father   . Mesothelioma Father   . Heart attack Brother    . Diabetes Brother   . Depression Brother   . Hyperlipidemia Brother   . Diabetes Maternal Grandmother   . Diabetes Maternal Grandfather   . Cancer Paternal Grandmother   . Diabetes Brother   . Hyperlipidemia Brother   . Colon cancer Neg Hx     Social History   Social History  . Marital status: Divorced    Spouse name: N/A  . Number of children: N/A  . Years of education: N/A   Occupational History  . [employer] features editor  Social History Main Topics  . Smoking status: Former Smoker    Types: Cigarettes  . Smokeless tobacco: Never Used  . Alcohol use Yes     Comment: ocassionally  . Drug use: No  . Sexual activity: No   Other Topics Concern  . Not on file   Social History Narrative  . No narrative on file    ROS:  Pertinent items are noted in HPI.  PHYSICAL EXAMINATION:    BP 124/84 (BP Location: Left Arm, Patient Position: Sitting, Cuff Size: Large)   Pulse 84   Ht 5' 3.5" (1.613 m)   Wt 272 lb (123.4 kg)   LMP 07/11/2007 (Approximate)   BMI 47.43 kg/m     General appearance: alert, cooperative and appears stated age   ASSESSMENT  Vaginal pain - essentially resolved.  Morbid obesity - limited pelvic evaluation and is part of the reason for her ultrasound today.  PLAN  Causes of vaginal pain discussed - infection, ovarian cysts, uterine pathology, constipation, kidney/bladder stones.  Reassurance regarding normal pelvic anatomy.  Questions invited an answered. Follow up prn.     An After Visit Summary was printed and given to the patient.  ___15___ minutes face to face time of which over 50% was spent in counseling.

## 2017-04-06 ENCOUNTER — Ambulatory Visit (HOSPITAL_COMMUNITY): Payer: Managed Care, Other (non HMO)

## 2017-04-13 ENCOUNTER — Ambulatory Visit (HOSPITAL_COMMUNITY)
Admission: RE | Admit: 2017-04-13 | Discharge: 2017-04-13 | Disposition: A | Discharge status: Home / Self Care | Payer: Managed Care, Other (non HMO) | Source: Ambulatory Visit | Attending: Otolaryngology | Admitting: Otolaryngology

## 2017-04-13 DIAGNOSIS — R1310 Dysphagia, unspecified: Secondary | ICD-10-CM | POA: Diagnosis not present

## 2017-04-19 ENCOUNTER — Ambulatory Visit (INDEPENDENT_AMBULATORY_CARE_PROVIDER_SITE_OTHER): Payer: Managed Care, Other (non HMO) | Admitting: Gastroenterology

## 2017-04-19 ENCOUNTER — Encounter: Payer: Self-pay | Admitting: Gastroenterology

## 2017-04-19 VITALS — BP 160/85 | HR 90 | Temp 98.1°F | Ht 63.0 in | Wt 276.6 lb

## 2017-04-19 DIAGNOSIS — R1310 Dysphagia, unspecified: Secondary | ICD-10-CM

## 2017-04-19 DIAGNOSIS — K760 Fatty (change of) liver, not elsewhere classified: Secondary | ICD-10-CM

## 2017-04-19 DIAGNOSIS — R1013 Epigastric pain: Secondary | ICD-10-CM | POA: Insufficient documentation

## 2017-04-19 DIAGNOSIS — R1319 Other dysphagia: Secondary | ICD-10-CM

## 2017-04-19 NOTE — Progress Notes (Signed)
CC'ED TO PCP 

## 2017-04-19 NOTE — Patient Instructions (Addendum)
1. I will review records from ENT. If we need you to increase your Prilosec we will let you know. 2. Otherwise I would recommend continuing Prilosec, once daily before breakfast, and call in 3-4 weeks and let me know how you are doing with regards to abdominal pain and swallowing.  3. Your recent liver blood work showed increase in liver enzymes. This is likely due to fatty liver and weight gain. Recommend slow gradual weight loss of at least 20 pounds over the next 2 months. 4. Repeat LFTs in 6 months.

## 2017-04-19 NOTE — Assessment & Plan Note (Signed)
61 year old female with history of fatty liver who presents with dysphagia to solid foods and liquids, epigastric pain. Recent ENT evaluation significant for what sounds like laryngopharyngeal reflux. Patient was placed on Prilosec. We've requested records. Barium esophagram unremarkable. Swallowing issues may be secondary to motility problem versus poorly controlled GERD. She has a history of gastritis on prior EGDs and has had some postprandial epigastric discomfort. Initially would recommend continuing PPI therapy. Reinforce antireflux measures. It is notable that she has been off PPI for quite some time up until recently. Encouraged slow gradual weight loss for both GERD benefits as well as fatty liver as her enzymes have now gone up. She voiced understanding.  She will call in a few weeks and if persistent symptoms would consider further evaluation. Also requested records from ENT for review.

## 2017-04-19 NOTE — Progress Notes (Signed)
Primary Care Physician:  Sharilyn Sites, MD  Primary Gastroenterologist:  Barney Drain, MD   Chief Complaint  Patient presents with  . Dysphagia    x 1 year-gets choked on liquid/solids    HPI:  Yolanda [name] is a 61 y.o. female here For further evaluation of dysphagia at the request of Dr. Sharilyn Sites. Patient last seen in 2016. She has chronic right-sided abdominal pain in the setting of fibromyalgia. Fatty liver. Last EGD and colonoscopy September 2015 showed hemorrhoids, 2 tubular adenomas removed, gastritis/duodenitis.  Has gained 45 pounds in the past 3 years. Continues to work. He recently saw Dr. Benjamine Mola for left neck swelling. Ultrasound in May indicated this was a normal-appearing left submandibular gland. She states she had the light ran through her nose but Dr. Benjamine Mola. Was told she had acid reflux and started on Prilosec. He also scheduled the barium esophagram which was unremarkable on October 5.  Patient complains of postprandial epigastric pain with every meal associated with nausea but no vomiting. Sometimes when eating or drinking she feels like the food or liquid stops in the upper chest area.Sometimes she coughs and the food or liquid comes back up. Can happen even with a single grain of rice. Pills seem to get out okay. No heartburn. She has a bowel movement about 3-4 times a day which is from normal. No nocturnal stools. Color ranges from very light very dark. No blood apparent.    Current Outpatient Prescriptions  Medication Sig Dispense Refill  . DULoxetine (CYMBALTA) 30 MG capsule Take 1 capsule by mouth daily.    . ergocalciferol (VITAMIN D2) 50000 units capsule Take 50,000 Units by mouth once a week.    Marland Kitchen HYDROcodone-acetaminophen (NORCO/VICODIN) 5-325 MG tablet Take 1 tablet by mouth as needed for moderate pain.    . Levothyroxine Sodium 100 MCG CAPS Take 100 mcg by mouth daily before breakfast.     . olmesartan-hydrochlorothiazide (BENICAR HCT) 40-25 MG tablet Take  1 tablet by mouth daily.    Marland Kitchen omeprazole (PRILOSEC) 20 MG capsule Take 1 capsule by mouth daily.    Glory Rosebush DELICA LANCETS 32T MISC     . ONETOUCH VERIO test strip     . prednisoLONE acetate (PRED FORTE) 1 % ophthalmic suspension once a week.     . RESTASIS MULTIDOSE 0.05 % ophthalmic emulsion Apply 1 drop to eye 2 (two) times daily.    . tamsulosin (FLOMAX) 0.4 MG CAPS capsule Take 1 capsule (0.4 mg total) by mouth daily. 7 capsule 0   No current facility-administered medications for this visit.     Allergies as of 04/19/2017 - Review Complete 04/19/2017  Allergen Reaction Noted  . Azithromycin Hives   . Lisinopril  04/19/2017  . Sulfonamide derivatives Other (See Comments)     Past Medical History:  Diagnosis Date  . Abnormal uterine bleeding   . Anxiety   . Arthritis   . Calculus of kidney 07/09/2008   Qualifier: Diagnosis of  By: Kellie Simmering LPN, Almyra Free    . Chronic abdominal pain   . Chronic pain in left foot   . Clotting disorder ([place])   . Depression   . Elevated liver function tests 2018  . Endometriosis   . Fatty liver   . Fibromyalgia   . Gastritis   . GERD 12/21/2009   Qualifier: Diagnosis of  By: Craige Cotta    . Hypertension    stopped meds in Aug 2015  . Hypothyroidism   . IBS (irritable  bowel syndrome)   . Internal hemorrhoids   . Kidney stone   . Obesity, morbid ([place]) 09/11/2012  . PE 07/09/2008   Qualifier: Diagnosis of  By: Kellie Simmering LPN, Almyra Free    . PE (pulmonary embolism)   . PONV (postoperative nausea and vomiting)   . Sleep apnea    hasn't worn a CPAP in 6 months  . Thyroid disease   . Vitamin D deficiency     Past Surgical History:  Procedure Laterality Date  . ABDOMINAL SURGERY     laparoscopy  . BIOPSY N/A 03/24/2014   Procedure: GASTRIC BIOPSIES;  Surgeon: Danie Binder, MD;  Location: [facility];  Service: Endoscopy;  Laterality: N/A;  . BREAST LUMPECTOMY    . CESAREAN SECTION     X2  . COLONOSCOPY  2006   internal hemorrhoids  .  COLONOSCOPY WITH PROPOFOL N/A 03/24/2014   Dr. [name]: 2 tubular adenomas removed, hemorrhoids  . CYSTOSCOPY W/ RETROGRADES  01/23/2012   Procedure: CYSTOSCOPY WITH RETROGRADE PYELOGRAM;  Surgeon: Marissa Nestle, MD;  Location: [facility];  Service: Urology;  Laterality: Left;  . DILATATION & CURETTAGE/HYSTEROSCOPY WITH MYOSURE N/A 11/18/2014   Procedure: DILATATION & CURETTAGE/HYSTEROSCOPY WITH MYOSURE, resection of polyp;  Surgeon: Cheri Fowler, MD;  Location: [facility];  Service: Gynecology;  Laterality: N/A;  . DILATION AND CURETTAGE OF UTERUS    . ESOPHAGOGASTRODUODENOSCOPY   08/24/2006   Dr. Veto Kemps erythema of the antrum without erosion or ulcers/Otherwise, normal esophagus without evidence of Barrett's path with chronic gastritis  . ESOPHAGOGASTRODUODENOSCOPY (EGD) WITH PROPOFOL N/A 03/24/2014   Dr. [name]: gastritis  . POLYPECTOMY N/A 03/24/2014   Procedure: POLYPECTOMY;  Surgeon: Danie Binder, MD;  Location: [facility];  Service: Endoscopy;  Laterality: N/A;  . REMOVAL OF STONES  01/23/2012   Procedure: REMOVAL OF STONES;  Surgeon: Marissa Nestle, MD;  Location: [facility];  Service: Urology;  Laterality: N/A;  . TUBAL LIGATION      Family History  Problem Relation Age of Onset  . Heart attack Mother        CABG  . Stroke Mother   . Hypertension Mother   . Cancer Mother   . Diabetes Mother   . Heart attack Father        CABG  . Hypertension Father   . Mesothelioma Father   . Heart attack Brother   . Diabetes Brother   . Depression Brother   . Hyperlipidemia Brother   . Diabetes Maternal Grandmother   . Diabetes Maternal Grandfather   . Cancer Paternal Grandmother   . Diabetes Brother   . Hyperlipidemia Brother   . Wilson's disease Other        2 nieces, one died in her 34s  . Colon cancer Neg Hx     Social History   Social History  . Marital status: Divorced    Spouse name: N/A  . Number of children: N/A  . Years of education: N/A   Occupational History  .  [employer] features editor   Social History Main Topics  . Smoking status: Former Smoker    Types: Cigarettes  . Smokeless tobacco: Never Used  . Alcohol use Yes     Comment: ocassionally  . Drug use: No  . Sexual activity: No   Other Topics Concern  . Not on file   Social History Narrative  . No narrative on file      ROS:  General: Negative for anorexia, weight loss,  fever, chills, fatigue, weakness. Eyes: Negative for vision changes.  ENT: Negative for   nasal congestion. +hoarseness, dysphagia CV: Negative for chest pain, angina, palpitations, dyspnea on exertion, peripheral edema.  Respiratory: Negative for dyspnea at rest, dyspnea on exertion, cough, sputum, wheezing.  GI: See history of present illness. GU:  Negative for dysuria, hematuria, urinary incontinence, urinary frequency, nocturnal urination.  MS: Negative for joint pain, low back pain.  Derm: Negative for rash or itching.  Neuro: Negative for weakness, abnormal sensation, seizure, frequent headaches, memory loss, confusion.  Psych: Negative for anxiety, depression, suicidal ideation, hallucinations.  Endo: Negative for unusual weight change.  Heme: Negative for bruising or bleeding. Allergy: Negative for rash or hives.    Physical Examination:  BP (!) 160/85   Pulse 90   Temp 98.1 F (36.7 C) (Oral)   Ht 5' 3"  (1.6 m)   Wt 276 lb 9.6 oz (125.5 kg)   LMP 07/11/2007 (Approximate)   BMI 49.00 kg/m    General: Well-nourished, well-developed in no acute distress. Voice is raspy. Head: Normocephalic, atraumatic.   Eyes: Conjunctiva pink, no icterus. Mouth: Oropharyngeal mucosa moist and pink , no lesions erythema or exudate. Neck: Supple without thyromegaly, masses, or lymphadenopathy.  Lungs: Clear to auscultation bilaterally.  Heart: Regular rate and rhythm, no murmurs rubs or gallops.  Abdomen: Bowel sounds are normal, nontender, nondistended, no hepatosplenomegaly or  masses, no abdominal bruits or    hernia , no rebound or guarding.   Rectal: Not performed Extremities: No lower extremity edema. No clubbing or deformities.  Neuro: Alert and oriented x 4 , grossly normal neurologically.  Skin: Warm and dry, no rash or jaundice.   Psych: Alert and cooperative, normal mood and affect.  Labs: Labs dated 02/20/2017 White blood cell count 10,500, hemoglobin 15.7, hematocrit 45.5, platelets 222,000. BUN 9, creatinine 0.56, total bilirubin 0.4, alkaline phosphatase 140H, AST 43 H, ALT 39 H, albumin 3.8, hepatitis C antibody negative, vitamin B12 489, TSH 5.63, hemoglobin A1c 6.1  Imaging Studies: US Transvaginal Non-ob  Result Date: 04/05/2017 SEE PROGRESS NOTE  Dg Esophagus  Result Date: 04/13/2017 CLINICAL DATA:  Dysphagia. EXAM: ESOPHOGRAM / BARIUM SWALLOW / BARIUM TABLET STUDY TECHNIQUE: Combined double contrast and single contrast examination performed using effervescent crystals, thick barium liquid, and thin barium liquid. The patient was observed with fluoroscopy swallowing a 13 mm barium sulphate tablet. FLUOROSCOPY TIME:  Fluoroscopy Time:  1 minutes 30 seconds Radiation Exposure Index (if provided by the fluoroscopic device): 47.4 mGy COMPARISON:  None. FINDINGS: The oropharyngeal swallowing mechanisms are normal. The mucosa and motility of the esophagus are normal. There is no hiatal hernia or demonstrable stricture or mass lesion. A 13 mm barium tablet passed from the mouth to the stomach with no significant delay. IMPRESSION: Normal barium esophagram. Electronically Signed   By: Lorriane Shire M.D.   On: 04/13/2017 09:27

## 2017-04-23 ENCOUNTER — Other Ambulatory Visit: Payer: Self-pay

## 2017-04-23 DIAGNOSIS — K760 Fatty (change of) liver, not elsewhere classified: Secondary | ICD-10-CM

## 2017-06-19 ENCOUNTER — Encounter: Payer: Self-pay | Admitting: Psychology

## 2017-06-26 ENCOUNTER — Encounter: Payer: Self-pay | Admitting: Psychology

## 2017-07-31 ENCOUNTER — Other Ambulatory Visit: Payer: Self-pay | Admitting: Urology

## 2017-07-31 DIAGNOSIS — N200 Calculus of kidney: Secondary | ICD-10-CM

## 2017-08-22 NOTE — Progress Notes (Signed)
REVIEWED-NO ADDITIONAL RECOMMENDATIONS. 

## 2017-09-24 ENCOUNTER — Other Ambulatory Visit: Payer: Self-pay

## 2017-09-24 DIAGNOSIS — K760 Fatty (change of) liver, not elsewhere classified: Secondary | ICD-10-CM

## 2017-09-26 ENCOUNTER — Ambulatory Visit: Payer: Managed Care, Other (non HMO) | Admitting: Urology

## 2017-09-26 DIAGNOSIS — R351 Nocturia: Secondary | ICD-10-CM | POA: Diagnosis not present

## 2017-09-26 DIAGNOSIS — N200 Calculus of kidney: Secondary | ICD-10-CM

## 2017-09-27 ENCOUNTER — Other Ambulatory Visit: Payer: Self-pay | Admitting: Urology

## 2017-09-27 ENCOUNTER — Ambulatory Visit (HOSPITAL_COMMUNITY)
Admission: RE | Admit: 2017-09-27 | Discharge: 2017-09-27 | Disposition: A | Discharge status: Home / Self Care | Payer: Managed Care, Other (non HMO) | Source: Ambulatory Visit | Attending: Urology | Admitting: Urology

## 2017-09-27 DIAGNOSIS — K7460 Unspecified cirrhosis of liver: Secondary | ICD-10-CM | POA: Insufficient documentation

## 2017-09-27 DIAGNOSIS — N200 Calculus of kidney: Secondary | ICD-10-CM | POA: Insufficient documentation

## 2017-09-28 ENCOUNTER — Ambulatory Visit (HOSPITAL_COMMUNITY): Payer: Managed Care, Other (non HMO)

## 2017-10-09 ENCOUNTER — Other Ambulatory Visit (HOSPITAL_COMMUNITY): Payer: Managed Care, Other (non HMO)

## 2017-10-23 LAB — HEPATIC FUNCTION PANEL
ALBUMIN: 3.7 g/dL (ref 3.6–4.8)
ALT: 39 IU/L — AB (ref 0–32)
AST: 44 IU/L — AB (ref 0–40)
Alkaline Phosphatase: 125 IU/L — ABNORMAL HIGH (ref 39–117)
BILIRUBIN TOTAL: 0.8 mg/dL (ref 0.0–1.2)
BILIRUBIN, DIRECT: 0.23 mg/dL (ref 0.00–0.40)
Total Protein: 7.5 g/dL (ref 6.0–8.5)

## 2017-10-29 NOTE — Progress Notes (Signed)
LFTS similar to her ones last year.  I saw her CT from 09/2017. Evidence of cirrhosis on that study.  She needs to have OV for cirrhosis.  Arrange for additional labs, PT/INR, CBC, met-7, AFP tumor marker, AMA, ASMA, ANA, IgG/IgA/IgM Recommend hep B vaccination if she has not had that done already.  

## 2017-10-30 ENCOUNTER — Encounter: Payer: Self-pay | Admitting: Gastroenterology

## 2017-10-30 ENCOUNTER — Other Ambulatory Visit: Payer: Self-pay

## 2017-10-30 DIAGNOSIS — K7460 Unspecified cirrhosis of liver: Secondary | ICD-10-CM

## 2017-10-30 NOTE — Progress Notes (Signed)
Pt is aware of results and plan. She will go to the lab soon. I have mailed the order for Hep B Vaccinations. Forwarding to [place] to schedule OV appt.

## 2017-10-30 NOTE — Progress Notes (Signed)
PATIENT SCHEDULED AND LETTER SENT  °

## 2017-10-30 NOTE — Progress Notes (Signed)
LMOM to call.

## 2017-10-30 NOTE — Progress Notes (Signed)
Lab orders have been entered.

## 2017-11-01 LAB — ANTI-SMOOTH MUSCLE ANTIBODY, IGG: SMOOTH MUSCLE AB: 17 U (ref 0–19)

## 2017-11-01 LAB — CBC WITH DIFFERENTIAL/PLATELET
BASOS ABS: 0 10*3/uL (ref 0.0–0.2)
Basos: 1 %
EOS (ABSOLUTE): 0.1 10*3/uL (ref 0.0–0.4)
EOS: 1 %
HEMATOCRIT: 44.1 % (ref 34.0–46.6)
HEMOGLOBIN: 14.8 g/dL (ref 11.1–15.9)
Immature Grans (Abs): 0 10*3/uL (ref 0.0–0.1)
Immature Granulocytes: 0 %
LYMPHS ABS: 2.3 10*3/uL (ref 0.7–3.1)
Lymphs: 39 %
MCH: 32.5 pg (ref 26.6–33.0)
MCHC: 33.6 g/dL (ref 31.5–35.7)
MCV: 97 fL (ref 79–97)
MONOCYTES: 6 %
MONOS ABS: 0.4 10*3/uL (ref 0.1–0.9)
NEUTROS ABS: 3.2 10*3/uL (ref 1.4–7.0)
Neutrophils: 53 %
Platelets: 195 10*3/uL (ref 150–379)
RBC: 4.56 x10E6/uL (ref 3.77–5.28)
RDW: 13.3 % (ref 12.3–15.4)
WBC: 6 10*3/uL (ref 3.4–10.8)

## 2017-11-01 LAB — MITOCHONDRIAL ANTIBODIES

## 2017-11-01 LAB — BASIC METABOLIC PANEL
BUN / CREAT RATIO: 16 (ref 12–28)
BUN: 11 mg/dL (ref 8–27)
CHLORIDE: 100 mmol/L (ref 96–106)
CO2: 26 mmol/L (ref 20–29)
Calcium: 8.9 mg/dL (ref 8.7–10.3)
Creatinine, Ser: 0.7 mg/dL (ref 0.57–1.00)
GFR calc non Af Amer: 93 mL/min/{1.73_m2} (ref >59)
GFR, EST AFRICAN AMERICAN: 107 mL/min/{1.73_m2} (ref >59)
GLUCOSE: 120 mg/dL — AB (ref 65–99)
Potassium: 3.8 mmol/L (ref 3.5–5.2)
SODIUM: 139 mmol/L (ref 134–144)

## 2017-11-01 LAB — IGG, IGA, IGM
IGM (IMMUNOGLOBULIN M), SRM: 129 mg/dL (ref 26–217)
IgA/Immunoglobulin A, Serum: 946 mg/dL — ABNORMAL HIGH (ref 87–352)
IgG (Immunoglobin G), Serum: 1808 mg/dL — ABNORMAL HIGH (ref 700–1600)

## 2017-11-01 LAB — AFP TUMOR MARKER: AFP, SERUM, TUMOR MARKER: 2.2 ng/mL (ref 0.0–8.3)

## 2017-11-01 LAB — ANA: ANA: NEGATIVE

## 2017-11-01 LAB — PROTIME-INR
INR: 1.1 (ref 0.8–1.2)
Prothrombin Time: 11.5 s (ref 9.1–12.0)

## 2017-11-20 NOTE — Progress Notes (Signed)
PT is aware.  Yolanda [name], please send labs to PCP.

## 2018-01-02 ENCOUNTER — Ambulatory Visit: Payer: Managed Care, Other (non HMO) | Admitting: Urology

## 2018-01-02 DIAGNOSIS — R351 Nocturia: Secondary | ICD-10-CM

## 2018-01-02 DIAGNOSIS — N200 Calculus of kidney: Secondary | ICD-10-CM

## 2018-01-22 ENCOUNTER — Encounter: Payer: Self-pay | Admitting: Gastroenterology

## 2018-01-22 ENCOUNTER — Ambulatory Visit: Payer: Managed Care, Other (non HMO) | Admitting: Gastroenterology

## 2018-01-22 DIAGNOSIS — K7460 Unspecified cirrhosis of liver: Secondary | ICD-10-CM | POA: Diagnosis not present

## 2018-01-22 DIAGNOSIS — K7581 Nonalcoholic steatohepatitis (NASH): Secondary | ICD-10-CM | POA: Insufficient documentation

## 2018-01-22 NOTE — Patient Instructions (Signed)
Please have your labs done.   Please call me with your waist circumference as soon as possible so I can touch base with local MRI team about where we will need to schedule your MRI.

## 2018-01-22 NOTE — Progress Notes (Addendum)
REVIEWED. PT LIKELY HAS NASH CIRRHOSIS. WEIGHT UP FROM 242 LBS IN 2015 TO 277 LBS IN 2019. NEED OPV/ [place] Q6 MOS. [place] DEC 2019: FATTY LIVER/?CIRRHOSIS. NEEDS NEXT OPV IN JUN 2020.   Primary Care Physician: Sharilyn Sites, MD  Primary Gastroenterologist:  Barney Drain, MD   Chief Complaint  Patient presents with  . Cirrhosis    HPI: Yolanda [name] is a 62 y.o. female here for follow up. Last seen in 04/2017. H/O fatty liver. She had f/u LFTs in 10/2017 with stable mildly elevated alkphos, AST, ALT. She had CT renal protocol in 09/2017 with findings c/w cirrhosis. Additional labs done based on these findings. IgG/IgA elevated. AMA, ASMA, ANA negative. AFP tumor marker normal. Platelets normal. MELD 7. Fibrospect lab in 2015 with F0-F1. Elastography in 2015 F4. Screened for Wilson's disease in 2015 as she has living niece with Wilson's/cirrhosis and one niece deceased due to Wilson's/cirrhosis in her 44s. Patient's ceruloplasmin was normal at 23. Urinary copper normal at 11. Has not been checked for KF rings. She is immune to Hep A.   Significant fatigue. Itching a lot more for past six month. Significant fibromyalgia. Extreme pain with U/S of liver before due to her fibromyalgia and difficulty of exam in setting of obesity. Falling asleep at work. May have to give up her job due to her chronic pain/fatigue.   No abd pain. No appetite concerns. BMs regular. No melena, brbpr. Hb controlled.   Current Outpatient Medications  Medication Sig Dispense Refill  . Cholecalciferol (VITAMIN D3) 2000 units TABS Take 1 tablet by mouth daily.    . DULoxetine (CYMBALTA) 30 MG capsule Take 1 capsule by mouth daily.    Marland Kitchen HYDROcodone-acetaminophen (NORCO) 10-325 MG tablet Take 1 tablet by mouth as needed.    . Levothyroxine Sodium 100 MCG CAPS Take 100 mcg by mouth daily before breakfast.     . olmesartan-hydrochlorothiazide (BENICAR HCT) 40-25 MG tablet Take 1 tablet by mouth daily.    Marland Kitchen omeprazole  (PRILOSEC) 20 MG capsule Take 1 capsule by mouth daily.    [name]     . ONETOUCH VERIO test strip     . RESTASIS MULTIDOSE 0.05 % ophthalmic emulsion Apply 1 drop to eye as needed.      No current facility-administered medications for this visit.     Allergies as of 01/22/2018 - Review Complete 01/22/2018  Allergen Reaction Noted  . Azithromycin Hives   . Lisinopril  04/19/2017  . Sulfonamide derivatives Other (See Comments)     ROS:  General: Negative for anorexia, weight loss, fever, chills, +fatigue, weakness. ENT: Negative for hoarseness, difficulty swallowing , nasal congestion. CV: Negative for chest pain, angina, palpitations, dyspnea on exertion, peripheral edema.  Respiratory: Negative for dyspnea at rest, dyspnea on exertion, cough, sputum, wheezing.  GI: See history of present illness. GU:  Negative for dysuria, hematuria, urinary incontinence, urinary frequency, nocturnal urination.  Endo: Negative for unusual weight change.    Physical Examination:   BP 134/70   Pulse 84   Temp (!) 97.5 F (36.4 C) (Oral)   Ht 5' 4"  (1.626 m)   Wt 277 lb 9.6 oz (125.9 kg)   LMP 07/11/2007 (Approximate)   BMI 47.65 kg/m   General: Well-nourished, well-developed in no acute distress. obese Eyes: No icterus. Mouth: Oropharyngeal mucosa moist and pink , no lesions erythema or exudate. Lungs: Clear to auscultation bilaterally.  Heart: Regular rate and rhythm, no murmurs rubs or  gallops.  Abdomen: Bowel sounds are normal, exam limited due to body habitus. Moderate tenderness with diffuse palpation. no hepatosplenomegaly or masses, no abdominal bruits or hernia , no rebound or guarding.   Extremities: No lower extremity edema. No clubbing or deformities. Neuro: Alert and oriented x 4   Skin: Warm and dry, no jaundice.   Psych: Alert and cooperative, normal mood and affect.  Labs:  Lab Results  Component Value Date   CREATININE 0.70 10/31/2017   BUN  11 10/31/2017   NA 139 10/31/2017   K 3.8 10/31/2017   CL 100 10/31/2017   CO2 26 10/31/2017   Lab Results  Component Value Date   ALT 39 (H) 10/22/2017   AST 44 (H) 10/22/2017   ALKPHOS 125 (H) 10/22/2017   BILITOT 0.8 10/22/2017   Lab Results  Component Value Date   INR 1.1 10/31/2017   INR 1.03 06/10/2014   INR 1.0 06/22/2008   Lab Results  Component Value Date   WBC 6.0 10/31/2017   HGB 14.8 10/31/2017   HCT 44.1 10/31/2017   MCV 97 10/31/2017   PLT 195 10/31/2017     Imaging Studies: No results found.

## 2018-01-27 NOTE — Assessment & Plan Note (Signed)
62 y/o female with CT evidence of cirrhosis, well compensated with MELD 7 in 10/2017. Suspected cirrhosis due to NASH. FH 2 nieces with Wilson's Disease. No evidence of portal hypertension on EGD in 2015. Noncontrast CT in 09/2017 without evidence of hepatoma. Immune to Hep A but not to Hep B.  Discussed with patient, need to follow every six months minimum with labs and hepatoma surveillance. She technically is due for imaging at this time as last study done without contrast. Due to obesity, U/S is limited and she has severe pain due to her fibromyalgia. Will proceed with MRI liver as better alternate study. Given her size she may have to have done at large bore MR in [place]. Will have her check waist circumference to help determine where to have her MRI done. Will update her labs.   Consider EGD for screening for esophageal varices at any time she is ready. She may have lapse in insurance if she goes out of work and before [employer] begins.   Consider checking for KF rings to completely screen for wilson's . To discuss with Dr. [name].

## 2018-01-28 NOTE — Progress Notes (Signed)
cc'ed to pcp °

## 2018-06-14 ENCOUNTER — Other Ambulatory Visit: Payer: Self-pay | Admitting: Urology

## 2018-06-14 DIAGNOSIS — N200 Calculus of kidney: Secondary | ICD-10-CM

## 2018-06-17 ENCOUNTER — Telehealth: Payer: Self-pay | Admitting: General Practice

## 2018-06-17 NOTE — Telephone Encounter (Signed)
Patient called back and stated her waist measurement was 63 inches.

## 2018-06-17 NOTE — Telephone Encounter (Signed)
Patient called to check about her next scheduled office appointment.    Per Leslie's office visit note dated 01/2018, the patient was supposed to call back with her waist circumference ASAP and have labs drawn, however the patient stated she forgot to do that.  She stated she will call back with that measurement and get her labs drawn.   Routing to [place] as a [employer]

## 2018-06-19 ENCOUNTER — Other Ambulatory Visit: Payer: Self-pay

## 2018-06-19 DIAGNOSIS — R768 Other specified abnormal immunological findings in serum: Secondary | ICD-10-CM

## 2018-06-19 DIAGNOSIS — K7460 Unspecified cirrhosis of liver: Secondary | ICD-10-CM

## 2018-06-19 DIAGNOSIS — R748 Abnormal levels of other serum enzymes: Secondary | ICD-10-CM

## 2018-06-19 LAB — CBC
Hematocrit: 43.5 % (ref 34.0–46.6)
Hemoglobin: 14.9 g/dL (ref 11.1–15.9)
MCH: 31.8 pg (ref 26.6–33.0)
MCHC: 34.3 g/dL (ref 31.5–35.7)
MCV: 93 fL (ref 79–97)
PLATELETS: 198 10*3/uL (ref 150–450)
RBC: 4.68 x10E6/uL (ref 3.77–5.28)
RDW: 13.3 % (ref 12.3–15.4)
WBC: 9 10*3/uL (ref 3.4–10.8)

## 2018-06-19 LAB — BASIC METABOLIC PANEL
BUN / CREAT RATIO: 18 (ref 12–28)
BUN: 13 mg/dL (ref 8–27)
CALCIUM: 8.9 mg/dL (ref 8.7–10.3)
CHLORIDE: 102 mmol/L (ref 96–106)
CO2: 24 mmol/L (ref 20–29)
Creatinine, Ser: 0.72 mg/dL (ref 0.57–1.00)
GFR, EST AFRICAN AMERICAN: 103 mL/min/{1.73_m2} (ref >59)
GFR, EST NON AFRICAN AMERICAN: 89 mL/min/{1.73_m2} (ref >59)
Glucose: 185 mg/dL — ABNORMAL HIGH (ref 65–99)
Potassium: 4.1 mmol/L (ref 3.5–5.2)
Sodium: 141 mmol/L (ref 134–144)

## 2018-06-19 LAB — HEPATIC FUNCTION PANEL
ALT: 44 IU/L — AB (ref 0–32)
AST: 41 IU/L — AB (ref 0–40)
Albumin: 3.3 g/dL — ABNORMAL LOW (ref 3.6–4.8)
Alkaline Phosphatase: 118 IU/L — ABNORMAL HIGH (ref 39–117)
Bilirubin Total: 0.4 mg/dL (ref 0.0–1.2)
Bilirubin, Direct: 0.15 mg/dL (ref 0.00–0.40)
Total Protein: 7.3 g/dL (ref 6.0–8.5)

## 2018-06-19 LAB — IGG, IGA, IGM
IGA/IMMUNOGLOBULIN A, SERUM: 956 mg/dL — AB (ref 87–352)
IGM (IMMUNOGLOBULIN M), SRM: 125 mg/dL (ref 26–217)
IgG (Immunoglobin G), Serum: 2019 mg/dL — ABNORMAL HIGH (ref 700–1600)

## 2018-06-19 LAB — PROTIME-INR
INR: 1.1 (ref 0.8–1.2)
PROTHROMBIN TIME: 11.4 s (ref 9.1–12.0)

## 2018-06-19 LAB — TISSUE TRANSGLUTAMINASE, IGA

## 2018-06-19 LAB — AFP TUMOR MARKER: AFP, SERUM, TUMOR MARKER: 2.3 ng/mL (ref 0.0–8.3)

## 2018-06-19 NOTE — Telephone Encounter (Signed)
See result note.  

## 2018-06-19 NOTE — Progress Notes (Signed)
PT is aware of results and plan. See other note about her waist circumference.  Forwarding to [facility].

## 2018-06-21 ENCOUNTER — Ambulatory Visit (HOSPITAL_COMMUNITY)
Admission: RE | Admit: 2018-06-21 | Discharge: 2018-06-21 | Disposition: A | Discharge status: Home / Self Care | Payer: PRIVATE HEALTH INSURANCE | Source: Ambulatory Visit | Attending: Urology | Admitting: Urology

## 2018-06-21 DIAGNOSIS — N200 Calculus of kidney: Secondary | ICD-10-CM | POA: Insufficient documentation

## 2018-06-26 ENCOUNTER — Ambulatory Visit (INDEPENDENT_AMBULATORY_CARE_PROVIDER_SITE_OTHER): Payer: PRIVATE HEALTH INSURANCE | Admitting: Urology

## 2018-06-26 DIAGNOSIS — M5489 Other dorsalgia: Secondary | ICD-10-CM | POA: Diagnosis not present

## 2018-06-26 DIAGNOSIS — R351 Nocturia: Secondary | ICD-10-CM | POA: Diagnosis not present

## 2018-06-26 DIAGNOSIS — N200 Calculus of kidney: Secondary | ICD-10-CM | POA: Diagnosis not present

## 2018-06-26 NOTE — Progress Notes (Signed)
PT is aware.

## 2018-07-01 ENCOUNTER — Telehealth: Payer: Self-pay | Admitting: *Deleted

## 2018-07-01 ENCOUNTER — Ambulatory Visit (HOSPITAL_COMMUNITY): Payer: PRIVATE HEALTH INSURANCE

## 2018-07-01 DIAGNOSIS — K7460 Unspecified cirrhosis of liver: Secondary | ICD-10-CM

## 2018-07-01 NOTE — Telephone Encounter (Signed)
Patient called. She is scheduled for MRI today at 1:30pm. She reports it will cost $3500. She does not want to pay this much. Per pt her insurance stated the only place that would be cheaper is in [facility]. Patient wants to know if there is another test that she could have done instead of the MRI? Please advise LSL thanks

## 2018-07-01 NOTE — Telephone Encounter (Signed)
U/S scheduled for 07/09/18 at 8:30am, arrival time 8:15am, npo after midnight.  Called patient and is aware of appt details. Nothing further needed

## 2018-07-01 NOTE — Addendum Note (Signed)
Addended by: Inge Rise on: 07/01/2018 12:57 PM   Modules accepted: Orders

## 2018-07-01 NOTE — Telephone Encounter (Signed)
Spoke with patient and made her aware. She was agreeable to having RUQ ultrasound done. She is aware she will pay out of pocket for this and was fine with that. I have placed order for RUQ u/s.

## 2018-07-01 NOTE — Telephone Encounter (Signed)
Unfortunately there will not be a lot we can do if she does note have benefits for diagnostic testing. Not a good plan for someone with cirrhosis.   Offer her ruq u/s for hepatoma surveillance.

## 2018-07-01 NOTE — Telephone Encounter (Signed)
Cancel MRI for today until issue can be resolved.   I'm not sure if her part is truly $3500. Reportedly her new insurance did not require PA.   Can we get any help verifying these benefits?  If truly out of pocket is that amount, then other alternative would be to try ruq u/s but can be limited exam in setting of large abdominal girth.

## 2018-07-01 NOTE — Telephone Encounter (Addendum)
LMTCB for pt. Called pre service center and spoke with [place]. Patient has notes in chart that they spoke with [place]- administrative concepts that is with patient plan. Patient has non traditional benefits. Patient's plan does not cover diagnostic testing and therefore the $3500 is the price of MRI out of pocket.

## 2018-07-09 ENCOUNTER — Ambulatory Visit (HOSPITAL_COMMUNITY)
Admission: RE | Admit: 2018-07-09 | Discharge: 2018-07-09 | Disposition: A | Discharge status: Home / Self Care | Payer: PRIVATE HEALTH INSURANCE | Source: Ambulatory Visit | Attending: Gastroenterology | Admitting: Gastroenterology

## 2018-07-09 DIAGNOSIS — K7460 Unspecified cirrhosis of liver: Secondary | ICD-10-CM

## 2018-07-15 ENCOUNTER — Other Ambulatory Visit: Payer: Self-pay

## 2018-07-15 DIAGNOSIS — K7460 Unspecified cirrhosis of liver: Secondary | ICD-10-CM

## 2018-07-15 NOTE — Progress Notes (Signed)
Pt is aware. Lab orders on file for 01/13/2019.  She said she will make an appt with eye doctor, although she had examine a little over a year ago.

## 2018-09-18 ENCOUNTER — Telehealth: Payer: Self-pay | Admitting: Gastroenterology

## 2018-09-18 ENCOUNTER — Encounter: Payer: Self-pay | Admitting: Gastroenterology

## 2018-09-18 NOTE — Telephone Encounter (Signed)
LMOM to call.

## 2018-09-18 NOTE — Telephone Encounter (Signed)
I called pt and she is aware. Forwarding to [place] to make the appt.

## 2018-09-18 NOTE — Telephone Encounter (Signed)
I REVIEWED EMR. PT LIKELY HAS NASH CIRRHOSIS. WEIGHT UP FROM 242 LBS IN 2015 TO 277 LBS IN 2019. NEED OPV/ [place] Q6 MOS. [place] DEC 2019: FATTY LIVER/?CIRRHOSIS. NEEDS NEXT OPV IN JUN 2020 WITH [facility].  PLEASE COUNSEL PT SHE SHOULD LOSE WEIGHT. HAVING A BMI > 30 AND FAT IN HER LIVER IS GOING TO MAKE HER LIVER WORSE. WE CAN REFER TO NUTRITION FOR CONSULT. IF PT IS UNABLE TO LOSE WEIGHT OVER THE NEXT 6-12 MOS WE SHOULD REFER HER TO THE BARIATRIC WEIGHT LOSS CLINIC.

## 2018-09-18 NOTE — Telephone Encounter (Signed)
Dr. [name], Will it be OK to use your urgent on 10/16/2018 for this pt?

## 2018-09-18 NOTE — Telephone Encounter (Signed)
Patient scheduled.

## 2018-09-18 NOTE — Telephone Encounter (Signed)
Pt called and I was informing her and then everything went silent and then cut off.

## 2018-09-18 NOTE — Telephone Encounter (Signed)
I had requested OV with SLF back in 07/2018 (see result note from 07/09/2018 [place] abd ruq).  "She needs ov with [facility] ONLY (KQ:ASUORVIF immunoglobulins/cirrhosis/FH Wilson's)."  Please make asap.

## 2018-11-04 NOTE — Progress Notes (Signed)
REVIEWED-NO ADDITIONAL RECOMMENDATIONS. 

## 2018-12-17 ENCOUNTER — Encounter: Payer: Self-pay | Admitting: *Deleted

## 2018-12-17 ENCOUNTER — Other Ambulatory Visit: Payer: Self-pay | Admitting: *Deleted

## 2018-12-17 DIAGNOSIS — K7460 Unspecified cirrhosis of liver: Secondary | ICD-10-CM

## 2019-01-01 ENCOUNTER — Ambulatory Visit (INDEPENDENT_AMBULATORY_CARE_PROVIDER_SITE_OTHER): Payer: PRIVATE HEALTH INSURANCE | Admitting: Gastroenterology

## 2019-01-01 ENCOUNTER — Other Ambulatory Visit: Payer: Self-pay

## 2019-01-01 ENCOUNTER — Encounter: Payer: Self-pay | Admitting: Gastroenterology

## 2019-01-01 DIAGNOSIS — K7460 Unspecified cirrhosis of liver: Secondary | ICD-10-CM | POA: Diagnosis not present

## 2019-01-01 DIAGNOSIS — Z860100 Personal history of colon polyps, unspecified: Secondary | ICD-10-CM | POA: Insufficient documentation

## 2019-01-01 DIAGNOSIS — K7581 Nonalcoholic steatohepatitis (NASH): Secondary | ICD-10-CM | POA: Diagnosis not present

## 2019-01-01 NOTE — Progress Notes (Signed)
cc'd to pcp 

## 2019-01-01 NOTE — Progress Notes (Signed)
Subjective:    Patient ID: Yolanda [name], female    DOB: [birth year], 63 y.o.   MRN: [id number]  Sharilyn Sites, MD  HPI Started Hepatitis B vaccine. BMs: GOOD AND NOT, SOMETIMES CONSTIPATION > DIARRHEA THESE DAYS. DRINKING WATER. EATING FIBER: PROBABLY NOT ENOUGH. May have pain with (ACHY, PRETTY MUCH ALL THE TIME, KEEPS HER UP AT NIGHT, HURTS IN MIDDLE AND ON THE SIDES OF BACK PAIN, NO RADIATION) THINKS RUQ MAY BE FIBROMYALGIA. WALKS WITH CANE ON RIGHT HAND.   PT DENIES FEVER, CHILLS, HEMATOCHEZIA, HEMATEMESIS, nausea, vomiting, melena, CHEST PAIN, SHORTNESS OF BREATH, CHANGE IN BOWEL IN HABITS, problems swallowing, problems with sedation, OR heartburn or indigestion.  Past Medical History:  Diagnosis Date   Abnormal uterine bleeding    Anxiety    Arthritis    Calculus of kidney 07/09/2008   Qualifier: Diagnosis of  By: Kellie Simmering LPN, Almyra Free     Chronic abdominal pain    Chronic pain in left foot    Clotting disorder ([place])    Depression    Elevated liver function tests 2018   Endometriosis    Fatty liver    Fibromyalgia    Gastritis    GERD 12/21/2009   Qualifier: Diagnosis of  By: Craige Cotta     Hypertension    stopped meds in Aug 2015   Hypothyroidism    IBS (irritable bowel syndrome)    Internal hemorrhoids    Kidney stone    Obesity, morbid ([place]) 09/11/2012   PE 07/09/2008   Qualifier: Diagnosis of  By: Kellie Simmering LPN, Almyra Free     PE (pulmonary embolism)    PONV (postoperative nausea and vomiting)    Sleep apnea    hasn't worn a CPAP in 6 months   Thyroid disease    Vitamin D deficiency     Past Surgical History:  Procedure Laterality Date   ABDOMINAL SURGERY     laparoscopy   BIOPSY N/A 03/24/2014   Procedure: GASTRIC BIOPSIES;  Surgeon: Danie Binder, MD;  Location: [facility];  Service: Endoscopy;  Laterality: N/A;   BREAST LUMPECTOMY     CESAREAN SECTION     X2   COLONOSCOPY  2006   internal hemorrhoids   COLONOSCOPY WITH  PROPOFOL N/A 03/24/2014   Dr. [name]: 2 tubular adenomas removed, hemorrhoids   CYSTOSCOPY W/ RETROGRADES  01/23/2012   Procedure: CYSTOSCOPY WITH RETROGRADE PYELOGRAM;  Surgeon: Marissa Nestle, MD;  Location: [facility];  Service: Urology;  Laterality: Left;   DILATATION & CURETTAGE/HYSTEROSCOPY WITH MYOSURE N/A 11/18/2014   Procedure: DILATATION & CURETTAGE/HYSTEROSCOPY WITH MYOSURE, resection of polyp;  Surgeon: Cheri Fowler, MD;  Location: [facility];  Service: Gynecology;  Laterality: N/A;   DILATION AND CURETTAGE OF UTERUS     ESOPHAGOGASTRODUODENOSCOPY   08/24/2006   Dr. Veto Kemps erythema of the antrum without erosion or ulcers/Otherwise, normal esophagus without evidence of Barrett's path with chronic gastritis   ESOPHAGOGASTRODUODENOSCOPY (EGD) WITH PROPOFOL N/A 03/24/2014   Dr. [name]: gastritis   POLYPECTOMY N/A 03/24/2014   Procedure: POLYPECTOMY;  Surgeon: Danie Binder, MD;  Location: [facility];  Service: Endoscopy;  Laterality: N/A;   REMOVAL OF STONES  01/23/2012   Procedure: REMOVAL OF STONES;  Surgeon: Marissa Nestle, MD;  Location: [facility];  Service: Urology;  Laterality: N/A;   TUBAL LIGATION     Allergies  Allergen Reactions   Azithromycin Hives   Lisinopril     cough   Sulfonamide Derivatives Other (See Comments)  Vaginal Infection     Current Outpatient Medications  Medication Sig     aspirin EC 81 MG tablet Take 81 mg by mouth daily.     Cholecalciferol (VITAMIN D3) 2000 units TABS Take 1 tablet by mouth daily.     CRANBERRY-VITAMIN C PO Take by mouth daily.     DULoxetine (CYMBALTA) 30 MG capsule Take 1 capsule by mouth daily.     Levothyroxine Sodium 100 MCG CAPS Take 100 mcg by mouth daily before breakfast.      olmesartan-hydrochlorothiazide (BENICAR HCT) 40-25 MG tablet Take 1 tablet by mouth daily.     Omega-3 Fatty Acids (FISH OIL) 1000 MG CAPS Take 1 capsule by mouth daily.     omeprazole (PRILOSEC) 20 MG capsule Take 1 capsule by  mouth daily.     RESTASIS MULTIDOSE 0.05 % ophthalmic emulsion Apply 1 drop to eye as needed.      Turmeric 500 MG TABS Take 1 tablet by mouth daily.     HYDROcodone-acetaminophen (NORCO) 10-325 MG tablet Take 1 tablet by mouth as needed.     ONETOUCH DELICA LANCETS 70W MISC      ONETOUCH VERIO test strip      Review of Systems PER HPI OTHERWISE ALL SYSTEMS ARE NEGATIVE.     Objective:   Physical Exam Vitals signs reviewed.  Constitutional:      General: She is not in acute distress.    Appearance: She is well-developed.  HENT:     Head: Normocephalic and atraumatic.     Mouth/Throat:     Pharynx: No oropharyngeal exudate.  Eyes:     General: No scleral icterus.    Pupils: Pupils are equal, round, and reactive to light.  Neck:     Musculoskeletal: Normal range of motion and neck supple.  Cardiovascular:     Rate and Rhythm: Normal rate and regular rhythm.     Heart sounds: Normal heart sounds.  Pulmonary:     Effort: Pulmonary effort is normal. No respiratory distress.     Breath sounds: Normal breath sounds.  Abdominal:     General: Bowel sounds are normal. There is no distension.     Palpations: Abdomen is soft.     Tenderness: There is abdominal tenderness. There is no guarding or rebound.     Comments: MODERATE TTP IN RUQ, LUQ, &  IN THE EPIGASTRIUM, MILD TTP IN LLQ   Musculoskeletal:     Right lower leg: No edema.     Left lower leg: No edema.     Comments: WALKS ASSISTED WITH A CANE.  Lymphadenopathy:     Cervical: No cervical adenopathy.  Skin:    Findings: Erythema and rash present.     Comments: BILATERAL ANTERIOR LOWER LEGS, MACULAR WON PATCHES WITH IRREGULAR BORDERS  Neurological:     Mental Status: She is alert and oriented to person, place, and time. Mental status is at baseline.     Comments: NO  NEW FOCAL DEFICITS  Psychiatric:        Mood and Affect: Mood normal.     Comments: NORMAL AFFECT        Assessment & Plan:

## 2019-01-01 NOTE — Progress Notes (Signed)
CC'D TO PCP °

## 2019-01-01 NOTE — Patient Instructions (Addendum)
DRINK WATER TO KEEP YOUR URINE LIGHT YELLOW.  FOLLOW A LOW FAT/HIGH FIBER DIET. REPLACE HIGH CALORIE SNACKS WITH LOW CALORIE FRUITS AND VEGETABLE(GRAPES, CHERRIES, CHERRY TOMATOES, CUCUMBER SLICES, ETC). SEE INFO BELOW.  CONTINUE YOUR WEIGHT LOSS EFFORTS. YOUR BODY MASS INDEX IS OVER 40 WHICH MEANS YOU ARE MORBIDLY OBESE. OBESITY IS ASSOCIATED WITH AN INCREASED FOR CIRRHOSIS AND ALL CANCERS, INCLUDING ESOPHAGEAL AND COLON CANCER. A WEIGHT OF 230 LBS OR LESS WILL GET YOUR BODY MASS INDEX(BMI) UNDER 40 BUT YOUR BODY MASS INDEX WILL STILL BE OVER 30 WHICH MEANS YOU ARE OBESE.  A WEIGHT OF 170 LBS OR LESS  WILL GET YOUR BODY MASS INDEX(BMI) UNDER 30. IF YOU TRY AND CANNOT LOSE WEIGHT PLEASE CALL FOR A REFERRAL TO THE BARIATRIC WEIGHT LOSS CLINIC.  PRACTICE CHAIR YOGA FOR 15-30 MINS 3 OR 4 TIMES A WEEK.  COMPLETE COLONOSCOPY AND UPPER ENDOSCOPY AFTER JAN 2021.  I WILL MAKE REFERRAL TO NUTRITION AFTER JAN 2021.  FOLLOW UP IN 1 YEAR.   High-Fiber Diet A high-fiber diet changes your normal diet to include more whole grains, legumes, fruits, and vegetables. Changes in the diet involve replacing refined carbohydrates with unrefined foods. The calorie level of the diet is essentially unchanged. The Dietary Reference Intake (recommended amount) for adult males is 38 grams per day. For adult females, it is 25 grams per day. Pregnant and lactating women should consume 28 grams of fiber per day. Fiber is the intact part of a plant that is not broken down during digestion. Functional fiber is fiber that has been isolated from the plant to provide a beneficial effect in the body.  PURPOSE  Increase stool bulk.   Ease and regulate bowel movements.   Lower cholesterol.   REDUCE RISK OF COLON CANCER   INDICATIONS THAT YOU NEED MORE FIBER  Constipation and hemorrhoids.   Uncomplicated diverticulosis (intestine condition) and irritable bowel syndrome.   Weight management.   As a protective measure  against hardening of the arteries (atherosclerosis), diabetes, and cancer.   GUIDELINES FOR INCREASING FIBER IN THE DIET  Start adding fiber to the diet slowly. A gradual increase of about 5 more grams (2 slices of whole-wheat bread, 2 servings of most fruits or vegetables, or 1 bowl of high-fiber cereal) per day is best. Too rapid an increase in fiber may result in constipation, flatulence, and bloating.   Drink enough water and fluids to keep your urine clear or pale yellow. Water, juice, or caffeine-free drinks are recommended. Not drinking enough fluid may cause constipation.   Eat a variety of high-fiber foods rather than one type of fiber.   Try to increase your intake of fiber through using high-fiber foods rather than fiber pills or supplements that contain small amounts of fiber.   The goal is to change the types of food eaten. Do not supplement your present diet with high-fiber foods, but replace foods in your present diet.  INCLUDE A VARIETY OF FIBER SOURCES  Replace refined and processed grains with whole grains, canned fruits with fresh fruits, and incorporate other fiber sources. White rice, white breads, and most bakery goods contain little or no fiber.   Brown whole-grain rice, buckwheat oats, and many fruits and vegetables are all good sources of fiber. These include: broccoli, Brussels sprouts, cabbage, cauliflower, beets, sweet potatoes, white potatoes (skin on), carrots, tomatoes, eggplant, squash, berries, fresh fruits, and dried fruits.   Cereals appear to be the richest source of fiber. Cereal fiber is found in  whole grains and bran. Bran is the fiber-rich outer coat of cereal grain, which is largely removed in refining. In whole-grain cereals, the bran remains. In breakfast cereals, the largest amount of fiber is found in those with "bran" in their names. The fiber content is sometimes indicated on the label.   You may need to include additional fruits and vegetables each  day.   In baking, for 1 cup white flour, you may use the following substitutions:   1 cup whole-wheat flour minus 2 tablespoons.   1/2 cup white flour plus 1/2 cup whole-wheat flour.   Low-Fat Diet BREADS, CEREALS, PASTA, RICE, DRIED PEAS, AND BEANS These products are high in carbohydrates and most are low in fat. Therefore, they can be increased in the diet as substitutes for fatty foods. They too, however, contain calories and should not be eaten in excess. Cereals can be eaten for snacks as well as for breakfast.   FRUITS AND VEGETABLES It is good to eat fruits and vegetables. Besides being sources of fiber, both are rich in vitamins and some minerals. They help you get the daily allowances of these nutrients. Fruits and vegetables can be used for snacks and desserts.  MEATS Limit lean meat, chicken, [place], and fish to no more than 6 ounces per day. Beef, Pork, and Lamb Use lean cuts of beef, pork, and lamb. Lean cuts include:  Extra-lean ground beef.  Arm roast.  Sirloin tip.  Center-cut ham.  Round steak.  Loin chops.  Rump roast.  Tenderloin.  Trim all fat off the outside of meats before cooking. It is not necessary to severely decrease the intake of red meat, but lean choices should be made. Lean meat is rich in protein and contains a highly absorbable form of iron. Premenopausal women, in particular, should avoid reducing lean red meat because this could increase the risk for low red blood cells (iron-deficiency anemia).  Chicken and [place] These are good sources of protein. The fat of poultry can be reduced by removing the skin and underlying fat layers before cooking. Chicken and [place] can be substituted for lean red meat in the diet. Poultry should not be fried or covered with high-fat sauces. Fish and Shellfish Fish is a good source of protein. Shellfish contain cholesterol, but they usually are low in saturated fatty acids. The preparation of fish is important. Like  chicken and [place], they should not be fried or covered with high-fat sauces. EGGS Egg whites contain no fat or cholesterol. They can be eaten often. Try 1 to 2 egg whites instead of whole eggs in recipes or use egg substitutes that do not contain yolk. MILK AND DAIRY PRODUCTS Use skim or 1% milk instead of 2% or whole milk. Decrease whole milk, natural, and processed cheeses. Use nonfat or low-fat (2%) cottage cheese or low-fat cheeses made from vegetable oils. Choose nonfat or low-fat (1 to 2%) yogurt. Experiment with evaporated skim milk in recipes that call for heavy cream. Substitute low-fat yogurt or low-fat cottage cheese for sour cream in dips and salad dressings. Have at least 2 servings of low-fat dairy products, such as 2 glasses of skim (or 1%) milk each day to help get your daily calcium intake. FATS AND OILS Reduce the total intake of fats, especially saturated fat. Butterfat, lard, and beef fats are high in saturated fat and cholesterol. These should be avoided as much as possible. Vegetable fats do not contain cholesterol, but certain vegetable fats, such as coconut oil, palm  oil, and palm kernel oil are very high in saturated fats. These should be limited. These fats are often used in bakery goods, processed foods, popcorn, oils, and nondairy creamers. Vegetable shortenings and some peanut butters contain hydrogenated oils, which are also saturated fats. Read the labels on these foods and check for saturated vegetable oils. Unsaturated vegetable oils and fats do not raise blood cholesterol. However, they should be limited because they are fats and are high in calories. Total fat should still be limited to 30% of your daily caloric intake. Desirable liquid vegetable oils are corn oil, cottonseed oil, olive oil, canola oil, safflower oil, soybean oil, and sunflower oil. Peanut oil is not as good, but small amounts are acceptable. Buy a heart-healthy tub margarine that has no partially  hydrogenated oils in the ingredients. Mayonnaise and salad dressings often are made from unsaturated fats, but they should also be limited because of their high calorie and fat content. Seeds, nuts, peanut butter, olives, and avocados are high in fat, but the fat is mainly the unsaturated type. These foods should be limited mainly to avoid excess calories and fat. OTHER EATING TIPS Snacks  Most sweets should be limited as snacks. They tend to be rich in calories and fats, and their caloric content outweighs their nutritional value. Some good choices in snacks are graham crackers, melba toast, soda crackers, bagels (no egg), English muffins, fruits, and vegetables. These snacks are preferable to snack crackers, [place] fries, TORTILLA CHIPS, and POTATO chips. Popcorn should be air-popped or cooked in small amounts of liquid vegetable oil. Desserts Eat fruit, low-fat yogurt, and fruit ices instead of pastries, cake, and cookies. Sherbet, angel food cake, gelatin dessert, frozen low-fat yogurt, or other frozen products that do not contain saturated fat (pure fruit juice bars, frozen ice pops) are also acceptable.  COOKING METHODS Choose those methods that use little or no fat. They include: Poaching.  Braising.  Steaming.  Grilling.  Baking.  Stir-frying.  Broiling.  Microwaving.  Foods can be cooked in a nonstick pan without added fat, or use a nonfat cooking spray in regular cookware. Limit fried foods and avoid frying in saturated fat. Add moisture to lean meats by using water, broth, cooking wines, and other nonfat or low-fat sauces along with the cooking methods mentioned above. Soups and stews should be chilled after cooking. The fat that forms on top after a few hours in the refrigerator should be skimmed off. When preparing meals, avoid using excess salt. Salt can contribute to raising blood pressure in some people.  EATING AWAY FROM HOME Order entres, potatoes, and vegetables without  sauces or butter. When meat exceeds the size of a deck of cards (3 to 4 ounces), the rest can be taken home for another meal. Choose vegetable or fruit salads and ask for low-calorie salad dressings to be served on the side. Use dressings sparingly. Limit high-fat toppings, such as bacon, crumbled eggs, cheese, sunflower seeds, and olives. Ask for heart-healthy tub margarine instead of butter.

## 2019-01-01 NOTE — Assessment & Plan Note (Addendum)
WELL COMPENSATED DISEASE. 2018: 276 LBS AND NOW 280  LBS  CHECK [place] Q6MOS AND LABS QYR. DRINK WATER TO KEEP YOUR URINE LIGHT YELLOW. FOLLOW A LOW FAT/HIGH FIBER DIET. REPLACE HIGH CALORIE SNACKS WITH LOW CALORIE FRUITS AND VEGETABLE(GRAPES, CHERRIES, CHERRY TOMATOES, CUCUMBER SLICES, ETC).  HANDOUT GIVEN. CONTINUE YOUR WEIGHT LOSS EFFORTS. YOUR BODY MASS INDEX IS OVER 40 WHICH MEANS YOU ARE MORBIDLY OBESE. OBESITY IS ASSOCIATED WITH AN INCREASED FOR CIRRHOSIS AND ALL CANCERS, INCLUDING ESOPHAGEAL AND COLON CANCER. A WEIGHT OF 230 LBS OR LESS WILL GET YOUR BODY MASS INDEX(BMI) UNDER 40 BUT YOUR BODY MASS INDEX WILL STILL BE OVER 30 WHICH MEANS YOU ARE OBESE.  A WEIGHT OF 170 LBS OR LESS  WILL GET YOUR BODY MASS INDEX(BMI) UNDER 30. IF YOU TRY AND CANNOT LOSE WEIGHT PLEASE CALL FOR A REFERRAL TO THE BARIATRIC WEIGHT LOSS CLINIC. PRACTICE CHAIR YOGA FOR 15-30 MINS 3 OR 4 TIMES A WEEK. COMPLETE UPPER ENDOSCOPY AFTER JAN 2021 PE RPT REQUEST. I WILL MAKE REFERRAL TO NUTRITION AFTER JAN 2021 PER PT REQUEST. DISCUSSED WITH PT IF SHE IS UNABLE TO LOSE WEIGHT OVER THE NEXT YEAR SHE SHOULD CONSIDER BARIATRIC SURGERY. FOLLOW UP IN 1 YEAR.

## 2019-01-01 NOTE — Assessment & Plan Note (Signed)
NO WARNING SIGNS/SYMPTOMS  DRINK WATER TO KEEP YOUR URINE LIGHT YELLOW. FOLLOW A LOW FAT/HIGH FIBER DIET. REPLACE HIGH CALORIE SNACKS WITH LOW CALORIE FRUITS AND VEGETABLE(GRAPES, CHERRIES, CHERRY TOMATOES, CUCUMBER SLICES, ETC).  CONTINUE YOUR WEIGHT LOSS EFFORTS. YOUR BODY MASS INDEX IS OVER 40 WHICH MEANS YOU ARE MORBIDLY OBESE. OBESITY IS ASSOCIATED WITH AN INCREASED FOR CIRRHOSIS AND ALL CANCERS, INCLUDING ESOPHAGEAL AND COLON CANCER. A WEIGHT OF 230 LBS OR LESS WILL GET YOUR BODY MASS INDEX(BMI) UNDER 40 BUT YOUR BODY MASS INDEX WILL STILL BE OVER 30 WHICH MEANS YOU ARE OBESE.  A WEIGHT OF 170 LBS OR LESS  WILL GET YOUR BODY MASS INDEX(BMI) UNDER 30. IF YOU TRY AND CANNOT LOSE WEIGHT PLEASE CALL FOR A REFERRAL TO THE BARIATRIC WEIGHT LOSS CLINIC. PRACTICE CHAIR YOGA FOR 15-30 MINS 3 OR 4 TIMES A WEEK. COMPLETE COLONOSCOPY AFTER JAN 2021. I WILL MAKE REFERRAL TO NUTRITION AFTER JAN 2021. FOLLOW UP IN 1 YEAR.

## 2019-05-19 ENCOUNTER — Encounter: Payer: Self-pay | Admitting: Gastroenterology

## 2019-06-10 ENCOUNTER — Ambulatory Visit: Payer: PRIVATE HEALTH INSURANCE | Admitting: Gastroenterology

## 2019-06-12 ENCOUNTER — Other Ambulatory Visit (HOSPITAL_COMMUNITY): Payer: Self-pay | Admitting: Urology

## 2019-06-12 ENCOUNTER — Other Ambulatory Visit: Payer: Self-pay | Admitting: Urology

## 2019-06-12 DIAGNOSIS — N200 Calculus of kidney: Secondary | ICD-10-CM

## 2019-06-18 ENCOUNTER — Other Ambulatory Visit (HOSPITAL_COMMUNITY): Payer: Self-pay | Admitting: Family Medicine

## 2019-06-18 ENCOUNTER — Ambulatory Visit (HOSPITAL_COMMUNITY): Payer: Medicare Other

## 2019-06-18 DIAGNOSIS — Z1231 Encounter for screening mammogram for malignant neoplasm of breast: Secondary | ICD-10-CM

## 2019-06-20 ENCOUNTER — Encounter: Payer: Self-pay | Admitting: Neurology

## 2019-06-25 ENCOUNTER — Ambulatory Visit (HOSPITAL_COMMUNITY)
Admission: RE | Admit: 2019-06-25 | Discharge: 2019-06-25 | Disposition: A | Discharge status: Home / Self Care | Payer: Medicare Other | Source: Ambulatory Visit | Attending: Urology | Admitting: Urology

## 2019-06-25 ENCOUNTER — Other Ambulatory Visit: Payer: Self-pay

## 2019-06-25 DIAGNOSIS — N200 Calculus of kidney: Secondary | ICD-10-CM | POA: Insufficient documentation

## 2019-07-02 ENCOUNTER — Encounter: Payer: Self-pay | Admitting: Urology

## 2019-07-02 ENCOUNTER — Other Ambulatory Visit: Payer: Self-pay

## 2019-07-02 ENCOUNTER — Ambulatory Visit (INDEPENDENT_AMBULATORY_CARE_PROVIDER_SITE_OTHER): Payer: Medicare Other | Admitting: Urology

## 2019-07-02 VITALS — BP 137/79 | HR 84 | Temp 97.9°F | Ht 64.0 in | Wt 270.0 lb

## 2019-07-02 DIAGNOSIS — N200 Calculus of kidney: Secondary | ICD-10-CM

## 2019-07-02 LAB — POCT URINALYSIS DIPSTICK
Bilirubin, UA: NEGATIVE
Blood, UA: NEGATIVE
Glucose, UA: NEGATIVE
Ketones, UA: NEGATIVE
Leukocytes, UA: NEGATIVE
Nitrite, UA: NEGATIVE
Protein, UA: NEGATIVE
Spec Grav, UA: 1.015 (ref 1.010–1.025)
Urobilinogen, UA: NEGATIVE E.U./dL — AB
pH, UA: 6.5 (ref 5.0–8.0)

## 2019-07-02 NOTE — Patient Instructions (Signed)

## 2019-07-02 NOTE — Progress Notes (Signed)
07/02/2019 9:49 AM   Yolanda [name] [birth year] [id number]  Referring provider: Sharilyn Sites, MD [address] [place],  [place] [address]  Nephrolithiasis  HPI: Yolanda [name] is a 63yo with a hx of nephrolithiasis here for followup. No stone events since last visit. Renal US reviewed from 12/10 shows no calculi. She drinks over 64oz of water daily. She drinks lime juice. No hematuria. No LUTS. No flank pain.    PMH: Past Medical History:  Diagnosis Date  . Abnormal uterine bleeding   . Anxiety   . Arthritis   . Calculus of kidney 07/09/2008   Qualifier: Diagnosis of  By: Kellie Simmering LPN, Almyra Free    . Chronic abdominal pain   . Chronic pain in left foot   . Clotting disorder ([place])   . Depression   . Elevated liver function tests 2018  . Endometriosis   . Fatty liver   . Fibromyalgia   . Gastritis   . GERD 12/21/2009   Qualifier: Diagnosis of  By: Craige Cotta    . Hypertension    stopped meds in Aug 2015  . Hypothyroidism   . IBS (irritable bowel syndrome)   . Internal hemorrhoids   . Kidney stone   . Obesity, morbid ([place]) 09/11/2012  . PE 07/09/2008   Qualifier: Diagnosis of  By: Kellie Simmering LPN, Almyra Free    . PE (pulmonary embolism)   . PONV (postoperative nausea and vomiting)   . Sleep apnea    hasn't worn a CPAP in 6 months  . Thyroid disease   . Vitamin D deficiency     Surgical History: Past Surgical History:  Procedure Laterality Date  . ABDOMINAL SURGERY     laparoscopy  . BIOPSY N/A 03/24/2014   Procedure: GASTRIC BIOPSIES;  Surgeon: Danie Binder, MD;  Location: [facility];  Service: Endoscopy;  Laterality: N/A;  . BREAST LUMPECTOMY    . CESAREAN SECTION     X2  . COLONOSCOPY  2006   internal hemorrhoids  . COLONOSCOPY WITH PROPOFOL N/A 03/24/2014   Dr. [name]: 2 tubular adenomas removed, hemorrhoids  . CYSTOSCOPY W/ RETROGRADES  01/23/2012   Procedure: CYSTOSCOPY WITH RETROGRADE PYELOGRAM;  Surgeon: Marissa Nestle, MD;  Location: [facility];  Service:  Urology;  Laterality: Left;  . DILATATION & CURETTAGE/HYSTEROSCOPY WITH MYOSURE N/A 11/18/2014   Procedure: DILATATION & CURETTAGE/HYSTEROSCOPY WITH MYOSURE, resection of polyp;  Surgeon: Cheri Fowler, MD;  Location: [facility];  Service: Gynecology;  Laterality: N/A;  . DILATION AND CURETTAGE OF UTERUS    . ESOPHAGOGASTRODUODENOSCOPY   08/24/2006   Dr. Veto Kemps erythema of the antrum without erosion or ulcers/Otherwise, normal esophagus without evidence of Barrett's path with chronic gastritis  . ESOPHAGOGASTRODUODENOSCOPY (EGD) WITH PROPOFOL N/A 03/24/2014   Dr. [name]: gastritis  . POLYPECTOMY N/A 03/24/2014   Procedure: POLYPECTOMY;  Surgeon: Danie Binder, MD;  Location: [facility];  Service: Endoscopy;  Laterality: N/A;  . REMOVAL OF STONES  01/23/2012   Procedure: REMOVAL OF STONES;  Surgeon: Marissa Nestle, MD;  Location: [facility];  Service: Urology;  Laterality: N/A;  . TUBAL LIGATION      Home Medications:  Allergies as of 07/02/2019      Reactions   Azithromycin Hives   Lisinopril    cough   Sulfonamide Derivatives Other (See Comments)   Vaginal Infection       Medication List       Accurate as of July 02, 2019  9:49 AM. If you have any questions,  ask your nurse or doctor.        STOP taking these medications   HYDROcodone-acetaminophen 10-325 MG tablet Commonly known as: NORCO Stopped by: Nicolette Bang, MD     TAKE these medications   aspirin 325 MG tablet Take 325 mg by mouth daily. What changed: Another medication with the same name was removed. Continue taking this medication, and follow the directions you see here. Changed by: Nicolette Bang, MD   CRANBERRY-VITAMIN C PO Take by mouth daily.   DULoxetine 30 MG capsule Commonly known as: CYMBALTA Take 1 capsule by mouth daily.   Fish Oil 1000 MG Caps Take 1 capsule by mouth daily.   Levothyroxine Sodium 100 MCG Caps Take 100 mcg by mouth daily before breakfast.     olmesartan-hydrochlorothiazide 40-25 MG tablet Commonly known as: BENICAR HCT Take 1 tablet by mouth daily.   omeprazole 20 MG capsule Commonly known as: PRILOSEC Take 1 capsule by mouth daily.   OneTouch Delica Lancets 85O Misc   OneTouch Verio test strip Generic drug: glucose blood   Restasis Multidose 0.05 % ophthalmic emulsion Generic drug: cycloSPORINE Apply 1 drop to eye as needed.   rosuvastatin 10 MG tablet Commonly known as: CRESTOR Take 10 mg by mouth daily.   Turmeric 500 MG Tabs Take 1 tablet by mouth daily.   Vitamin D3 50 MCG (2000 UT) Tabs Take 1 tablet by mouth daily.       Allergies:  Allergies  Allergen Reactions  . Azithromycin Hives  . Lisinopril     cough  . Sulfonamide Derivatives Other (See Comments)    Vaginal Infection     Family History: Family History  Problem Relation Age of Onset  . Heart attack Mother        CABG  . Stroke Mother   . Hypertension Mother   . Cancer Mother   . Diabetes Mother   . Heart attack Father        CABG  . Hypertension Father   . Mesothelioma Father   . Heart attack Brother   . Diabetes Brother   . Depression Brother   . Hyperlipidemia Brother   . Diabetes Maternal Grandmother   . Diabetes Maternal Grandfather   . Cancer Paternal Grandmother   . Diabetes Brother   . Hyperlipidemia Brother   . Wilson's disease Other        2 nieces, one died in her 37s  . Colon cancer Neg Hx     Social History:  reports that she has quit smoking. Her smoking use included cigarettes. She has never used smokeless tobacco. She reports previous alcohol use. She reports that she does not use drugs.  ROS: Urological Symptom Review  Patient is experiencing the following symptoms: Injury to kidneys/bladder   Review of Systems  Gastrointestinal (upper)  : Negative for upper GI symptoms  Gastrointestinal (lower) : Negative for lower GI symptoms  Constitutional : Negative for symptoms  Skin: Negative for  skin symptoms  Eyes: Negative for eye symptoms  Ear/Nose/Throat : Negative for Ear/Nose/Throat symptoms  Hematologic/Lymphatic: Negative for Hematologic/Lymphatic symptoms  Cardiovascular : Negative for cardiovascular symptoms  Respiratory : Negative for respiratory symptoms  Endocrine: Negative for endocrine symptoms  Musculoskeletal: Negative for musculoskeletal symptoms  Neurological: Negative for neurological symptoms  Psychologic: Negative for psychiatric symptoms  Physical Exam: BP 137/79   Pulse 84   Temp 97.9 F (36.6 C)   Ht 5' 4"  (1.626 m)   Wt 270 lb (122.5 kg)   LMP 07/11/2007 (Approximate)   BMI 46.35 kg/m   Constitutional:  Alert and oriented, No acute distress. HEENT: [place] AT, moist mucus membranes.  Trachea midline, no masses. Cardiovascular: No clubbing, cyanosis, or edema. Respiratory: Normal respiratory effort, no increased work of breathing. GI: Abdomen is soft, nontender, nondistended, no abdominal masses GU: No CVA tenderness Lymph: No cervical or inguinal lymphadenopathy. Skin: No rashes, bruises or suspicious lesions. Neurologic: Grossly intact, no focal deficits, moving all 4 extremities. Psychiatric: Normal mood and affect.  Laboratory Data: Lab Results  Component Value Date   WBC 9.0 06/18/2018   HGB 14.9 06/18/2018   HCT 43.5 06/18/2018   MCV 93 06/18/2018   PLT 198 06/18/2018    Lab Results  Component Value Date   CREATININE 0.72 06/18/2018    No results found for: PSA  No results found for: TESTOSTERONE  No results found for: HGBA1C  Urinalysis    Component Value Date/Time   COLORURINE YELLOW 04/15/2015 0028   APPEARANCEUR HAZY (A) 04/15/2015 0028   LABSPEC 1.020 04/15/2015 0028   PHURINE 6.0 04/15/2015 0028   GLUCOSEU NEGATIVE 04/15/2015 0028   HGBUR LARGE (A) 04/15/2015 0028   BILIRUBINUR neg 07/02/2019 0932   KETONESUR TRACE (A) 04/15/2015 0028    PROTEINUR Negative 07/02/2019 0932   PROTEINUR NEGATIVE 04/15/2015 0028   UROBILINOGEN negative (A) 07/02/2019 0932   UROBILINOGEN 0.2 04/15/2015 0028   NITRITE neg 07/02/2019 0932   NITRITE NEGATIVE 04/15/2015 0028   LEUKOCYTESUR Negative 07/02/2019 0932    Lab Results  Component Value Date   WBCUA 0-5 03/07/2017   RBCUA 0-2 03/07/2017   LABEPIT 0-10 03/07/2017   MUCUS Present 03/07/2017   BACTERIA Few 03/07/2017    Pertinent Imaging: Renal US: No hydronephrosis and no calculi No results found for this or any previous visit. No results found for this or any previous visit. No results found for this or any previous visit. No results found for this or any previous visit. Results for orders placed during the hospital encounter of 06/25/19  US RENAL   Narrative CLINICAL DATA:  Evaluate for renal calculus.  EXAM: RENAL / URINARY TRACT ULTRASOUND COMPLETE  COMPARISON:  None.  FINDINGS: Right Kidney:  Renal measurements: 14.5 x 6.3 x 6.6 cm = volume: 317.4 mL . Echogenicity within normal limits. No mass or hydronephrosis visualized.  Left Kidney:  Renal measurements: 14.8 x 7.0 x 6.2 cm = volume: 334.5 mL. Echogenicity within normal limits. No mass or hydronephrosis visualized.  Bladder:  Appears normal for degree of bladder distention.  Other:  None.  IMPRESSION: Limited study due to patient body habitus. No renal stones or obstruction identified. No abnormalities are noted.   Electronically Signed   By: Dorise Bullion III M.D   On: 06/25/2019 15:39    No results found for this or any previous visit. No results found for this or any previous visit. Results for orders placed during the hospital encounter of 09/27/17  CT RENAL STONE STUDY   Narrative CLINICAL DATA:  Bilateral flank pain for 1 month.  EXAM: CT ABDOMEN AND PELVIS WITHOUT CONTRAST  TECHNIQUE: Multidetector CT imaging of the abdomen and pelvis was performed following the standard  protocol without IV contrast.  COMPARISON:  CT scan 04/24/2016  FINDINGS: Lower chest: The lung bases are clear of acute process.  Peripheral interstitial changes appear stable and likely related to interstitial lung disease. No pleural effusion or focal pulmonary lesion/nodule. The heart is upper limits of normal in size for age. Prominent epicardial and pericardial fat. Mild lipomatosis hypertrophy of the interatrial septum. The distal esophagus is grossly normal.  Hepatobiliary: Progressive cirrhotic changes involving the liver. Very irregular liver contour with increased caudate to right lobe ratio, dilated hepatic fissures and evidence of portal venous hypertension and portal venous collaterals. No splenomegaly. No ascites.  No focal hepatic lesions or intrahepatic biliary dilatation. The gallbladder is grossly normal. No common bile duct dilatation.  Pancreas: No mass, inflammation or ductal dilatation.  Spleen: Normal size.  No focal lesions.  Adrenals/Urinary Tract: The adrenal glands are unremarkable.  Two right-sided renal calculi. No left-sided renal calculi. No obstructing ureteral calculi or bladder calculi. No worrisome renal or bladder lesions without contrast.  Stomach/Bowel: The stomach, duodenum, small bowel and colon are grossly normal without oral contrast. No inflammatory changes, mass lesions or obstructive findings. The terminal ileum and appendix are normal.  Vascular/Lymphatic: The aorta is normal in caliber. No atheroscerlotic calcifications. No mesenteric of retroperitoneal mass or adenopathy. Small scattered lymph nodes are noted.  Reproductive: The uterus and ovaries are normal.  Other: No pelvic mass or adenopathy. No free pelvic fluid collections. No inguinal mass or adenopathy. No abdominal wall hernia or subcutaneous lesions.  Musculoskeletal: No significant bony findings. Mild degenerative changes involving both hips and the lumbar  spine.  IMPRESSION: 1. Two right-sided renal calculi but no obstructing ureteral calculi or bladder calculi. No worrisome renal or bladder lesions without contrast. 2. No acute abdominal/pelvic findings, mass lesions or lymphadenopathy. 3. Progressive cirrhotic changes involving the liver as described above. No worrisome hepatic lesion, splenomegaly or ascites.   Electronically Signed   By: Marijo Sanes M.D.   On: 09/28/2017 10:37     Assessment & Plan:    1. Kidney stones -Continue increase water to over 64oz daily and use lime juice -RTC 1 year with renal US   No follow-ups on file.  Nicolette Bang, MD  [facility] [place]

## 2019-07-14 ENCOUNTER — Encounter (HOSPITAL_COMMUNITY): Payer: Self-pay

## 2019-07-14 ENCOUNTER — Other Ambulatory Visit: Payer: Self-pay

## 2019-07-14 ENCOUNTER — Ambulatory Visit (HOSPITAL_COMMUNITY): Payer: Medicare Other | Attending: Physician Assistant

## 2019-07-14 DIAGNOSIS — R29898 Other symptoms and signs involving the musculoskeletal system: Secondary | ICD-10-CM | POA: Diagnosis present

## 2019-07-14 DIAGNOSIS — M25511 Pain in right shoulder: Secondary | ICD-10-CM | POA: Diagnosis present

## 2019-07-14 DIAGNOSIS — M25611 Stiffness of right shoulder, not elsewhere classified: Secondary | ICD-10-CM | POA: Diagnosis present

## 2019-07-14 NOTE — Patient Instructions (Signed)
Do _1-2__ sessions per day.  Copyright  VHI. All rights reserved.  [place] Stretch, Sitting   Sit, one hand tucked under hip on side to be stretched, other hand over top of head. Gently pull head to side and backwards. Hold _15-20__ seconds. For more stretch, lean body in direction of head pull. Repeat _1__ times per session. Do ___ sessions per day.   Copyright  VHI. All rights reserved.  Lower Cervical / Upper Thoracic Stretch   Clasp hands together in front with arms extended. Gently pull shoulder blades apart and bend head forward. Hold _15-20___ seconds. Repeat _2__ times per set.    AROM: Lateral Neck Flexion   Slowly tilt head toward one shoulder, then the other. Hold each position __2__ seconds. Repeat __10__ times per set. Do ____ sets per session. Do ____ sessions per day.      Copyright  VHI. All rights reserved.  AROM: Neck Rotation   Turn head slowly to look over one shoulder, then the other. Hold each position __2__ seconds. Repeat __10__ times per set. Do ____ sets per session. Do ____ sessions per day.  http://orth.exer.us/294   Copyright  VHI. All rights reserved.

## 2019-07-15 ENCOUNTER — Telehealth (HOSPITAL_COMMUNITY): Payer: Self-pay

## 2019-07-15 NOTE — Therapy (Addendum)
[facility] [address] [place], [place], [address] Phone: [phone]   Fax:  [phone]  Occupational Therapy Evaluation  Patient Details  Name: Yolanda [name] MRN: [id number] Date of Birth: Nov 17, 1955 Referring Provider (OT): Noemi Chapel NP   Encounter Date: 07/14/2019  OT End of Session - 07/15/19 0907    Visit Number  1    Number of Visits  12    Date for OT Re-Evaluation  08/25/19    Authorization Type  1) Medicare 2) [place] senior supplement -No copay. No authorization.    OT Start Time  1440    OT Stop Time  1517    OT Time Calculation (min)  37 min    Activity Tolerance  Patient tolerated treatment well    Behavior During Therapy  WFL for tasks assessed/performed       Past Medical History:  Diagnosis Date  . Abnormal uterine bleeding   . Anxiety   . Arthritis   . Calculus of kidney 07/09/2008   Qualifier: Diagnosis of  By: Kellie Simmering LPN, Almyra Free    . Chronic abdominal pain   . Chronic pain in left foot   . Clotting disorder ([place])   . Depression   . Elevated liver function tests 2018  . Endometriosis   . Fatty liver   . Fibromyalgia   . Gastritis   . GERD 12/21/2009   Qualifier: Diagnosis of  By: Craige Cotta    . Hypertension    stopped meds in Aug 2015  . Hypothyroidism   . IBS (irritable bowel syndrome)   . Internal hemorrhoids   . Kidney stone   . Obesity, morbid ([place]) 09/11/2012  . PE 07/09/2008   Qualifier: Diagnosis of  By: Kellie Simmering LPN, Almyra Free    . PE (pulmonary embolism)   . PONV (postoperative nausea and vomiting)   . Sleep apnea    hasn't worn a CPAP in 6 months  . Thyroid disease   . Vitamin D deficiency     Past Surgical History:  Procedure Laterality Date  . ABDOMINAL SURGERY     laparoscopy  . BIOPSY N/A 03/24/2014   Procedure: GASTRIC BIOPSIES;  Surgeon: Danie Binder, MD;  Location: [facility];  Service: Endoscopy;  Laterality: N/A;  . BREAST LUMPECTOMY    . CESAREAN SECTION     X2   . COLONOSCOPY  2006   internal hemorrhoids  . COLONOSCOPY WITH PROPOFOL N/A 03/24/2014   Dr. [name]: 2 tubular adenomas removed, hemorrhoids  . CYSTOSCOPY W/ RETROGRADES  01/23/2012   Procedure: CYSTOSCOPY WITH RETROGRADE PYELOGRAM;  Surgeon: Marissa Nestle, MD;  Location: [facility];  Service: Urology;  Laterality: Left;  . DILATATION & CURETTAGE/HYSTEROSCOPY WITH MYOSURE N/A 11/18/2014   Procedure: DILATATION & CURETTAGE/HYSTEROSCOPY WITH MYOSURE, resection of polyp;  Surgeon: Cheri Fowler, MD;  Location: [facility];  Service: Gynecology;  Laterality: N/A;  . DILATION AND CURETTAGE OF UTERUS    . ESOPHAGOGASTRODUODENOSCOPY   08/24/2006   Dr. Veto Kemps erythema of the antrum without erosion or ulcers/Otherwise, normal esophagus without evidence of Barrett's path with chronic gastritis  . ESOPHAGOGASTRODUODENOSCOPY (EGD) WITH PROPOFOL N/A 03/24/2014   Dr. [name]: gastritis  . POLYPECTOMY N/A 03/24/2014   Procedure: POLYPECTOMY;  Surgeon: Danie Binder, MD;  Location: [facility];  Service: Endoscopy;  Laterality: N/A;  . REMOVAL OF STONES  01/23/2012   Procedure: REMOVAL OF STONES;  Surgeon: Marissa Nestle, MD;  Location: [facility];  Service: Urology;  Laterality: N/A;  .  TUBAL LIGATION      There were no vitals filed for this visit.  Subjective Assessment - 07/14/19 1444    Subjective   S: It's my right shoulder tha is hurting not my left.    Pertinent History  Patient is a 64 y/o female S/P right RTC tendonitis, cervicalgia, and cervical radiculopathy which started approximately 2 months ago with no known cause. Noemi Chapel, PA has referred patient to occupational therapy for evaluation and treatment.    Patient Stated Goals  To be pain free.    Currently in Pain?  Yes    Pain Score  1     Pain Location  Shoulder    Pain Orientation  Right    Pain Descriptors / Indicators  Burning;Dull;Aching    Pain Type  Acute pain    Pain Radiating Towards  N/A    Pain Onset  More than a month ago     Pain Frequency  Constant    Aggravating Factors   increased use, lifting    Pain Relieving Factors  heating pad    Effect of Pain on Daily Activities  min-mod effect    Multiple Pain Sites  No        [facility] OT Assessment - 07/14/19 1447      Assessment   Medical Diagnosis  Right shoulder RTC tendonsis and cervicalgia    Referring Provider (OT)  Noemi Chapel PA   Onset Date/Surgical Date  --   approximately 2 months ago   Hand Dominance  Right    Next MD Visit  08/05/19    Prior Therapy  None for this condition      Precautions   Precautions  None      Restrictions   Weight Bearing Restrictions  No      Balance Screen   Has the patient fallen in the past 6 months  No      Home  Environment   Family/patient expects to be discharged to:  Private residence      Prior Function   Level of [place]  Retired      ADL   ADL comments  Difficulty with trying to sleep on the left side, pick medium-heavy weighted items, bending over and reaching out with the LUE, reaching overhead.      Mobility   Mobility Status  Independent      Written Expression   Dominant Hand  Right      Vision - History   Baseline Vision  No visual deficits      Cognition   Overall Cognitive Status  Within Functional Limits for tasks assessed      Observation/Other Assessments   Focus on Therapeutic Outcomes (FOTO)   59/100      ROM / Strength   AROM / PROM / Strength  AROM;PROM;Strength      Palpation   Palpation comment  Max fascial restrictions in the right upper arm, trapezius, scapularis, and bilateral cervical region.       AROM   Overall AROM Comments  Assessed seated. IR/er adducted    AROM Assessment Site  Shoulder;Cervical    Right/Left Shoulder  Right    Right Shoulder Flexion  142 Degrees   left : 157   Right Shoulder ABduction  125 Degrees   previous; 150   Right Shoulder Internal Rotation  90 Degrees   left: 90   Right Shoulder External  Rotation  70 Degrees  left: 70   Cervical Flexion  WFL    Cervical Extension  [facility]    Cervical - Right Side Bend  17    Cervical - Left Side Bend  24    Cervical - Right Rotation  35    Cervical - Left Rotation  40      PROM   Overall PROM Comments  Assessed supine. IR/ er adducted    PROM Assessment Site  Shoulder    Right/Left Shoulder  Right    Right Shoulder Flexion  148 Degrees    Right Shoulder ABduction  180 Degrees    Right Shoulder Internal Rotation  90 Degrees    Right Shoulder External Rotation  90 Degrees      Strength   Overall Strength Comments  Assessed seated. IR/er adducted    Strength Assessment Site  Shoulder    Right/Left Shoulder  Right    Right Shoulder Flexion  5/5    Right Shoulder ABduction  5/5    Right Shoulder Internal Rotation  5/5    Right Shoulder External Rotation  5/5                      OT Education - 07/15/19 0907    Education Details  cervical stretches    Person(s) Educated  Patient    Methods  Explanation;Demonstration;Verbal cues;Handout    Comprehension  Verbalized understanding;Verbal cues required;Returned demonstration       OT Short Term Goals - 07/15/19 0914      OT SHORT TERM GOAL #1   Title  Patient will be educated and independent with HEP in order to increase functional performance during ADL tasks.    Time  3    Period  Weeks    Status  New    Target Date  08/05/19      OT SHORT TERM GOAL #2   Title  Patient will increase her RUE P/ROM to [facility] in order to increase ability to complete reaching tasks above the shoulder level.    Time  3    Period  Weeks    Status  New        OT Long Term Goals - 07/15/19 2376      OT LONG TERM GOAL #1   Title  Patient will return to highest level of independence while utilizing her RUE and neck for all required ADL tasks 75% or more of the time.    Time  6    Period  Weeks    Status  New    Target Date  08/25/19      OT LONG TERM GOAL #2   Title  Patient  will increase RUE and cervical A/ROM to [facility] in order to increase ability to complete functional reaching tasks as well as increase ability to look right and left when driving.    Time  6    Period  Weeks    Status  New      OT LONG TERM GOAL #3   Title  Patient will decrease fascial restrictions to min amount or less in order to increase functional mobility needed to complete reaching tasks above shoulder level.    Time  6    Period  Weeks    Status  New      OT LONG TERM GOAL #4   Title  Patient will report a decrease in pain in the shoulder and/or neck of approximately 3/10 or less during functional daily  tasks.    Time  6    Period  Weeks    Status  New            Plan - 07/15/19 0910    Clinical Impression Statement  A: Patient is a 64 y/o female S/P right RTC tendonitis and cervical radiculopathy causing increased pain in the shoulder, decreased ROM and strength resulting in inability to complete daily tasks that require movement of her neck and use of her RUE.    OT Occupational Profile and History  Problem Focused Assessment - Including review of records relating to presenting problem    Occupational performance deficits (Please refer to evaluation for details):  ADL's;IADL's;Leisure    Body Structure / Function / Physical Skills  ADL;ROM;Fascial restriction;Strength;Decreased knowledge of use of DME;Pain;UE functional use    Rehab Potential  Good    Clinical Decision Making  Limited treatment options, no task modification necessary    Comorbidities Affecting Occupational Performance:  May have comorbidities impacting occupational performance    Modification or Assistance to Complete Evaluation   No modification of tasks or assist necessary to complete eval    OT Frequency  2x / week    OT Duration  6 weeks    OT Treatment/Interventions  Self-care/ADL training;Therapeutic exercise;Manual Therapy;Neuromuscular education;Ultrasound;Therapeutic activities;DME and/or AE  instruction;Cryotherapy;Electrical Stimulation;Moist Heat;Passive range of motion;Patient/family education    Plan  P: Patient will benefit from skilled OT services to increase functional performance during ADL tasks that require use of her neck and RUE. Treatment Plan: Myofascial release, manual stretching, A/ROM, general strengthening. Modalities PRN.    Consulted and Agree with Plan of Care  Patient       Patient will benefit from skilled therapeutic intervention in order to improve the following deficits and impairments:   Body Structure / Function / Physical Skills: ADL, ROM, Fascial restriction, Strength, Decreased knowledge of use of DME, Pain, UE functional use       Visit Diagnosis: Other symptoms and signs involving the musculoskeletal system - Plan: Ot plan of care cert/re-cert  Stiffness of right shoulder, not elsewhere classified - Plan: Ot plan of care cert/re-cert  Acute pain of right shoulder - Plan: Ot plan of care cert/re-cert    Problem List Patient Active Problem List   Diagnosis Date Noted  . History of colonic polyps 01/01/2019  . Liver cirrhosis secondary to NASH ([place]) 01/22/2018  . Dysphagia 04/19/2017  . Abdominal pain, epigastric 04/19/2017  . Abdominal pain 12/08/2014  . Postmenopausal bleeding 11/18/2014  . Closed fracture of distal clavicle 04/17/2014  . Muscle weakness (generalized) 03/26/2014  . Pain in joint, shoulder region 03/26/2014  . Decreased range of motion of right shoulder 03/26/2014  . Right clavicle fracture 03/19/2014  . Fatty liver 03/17/2014  . Rectal bleeding 03/17/2014  . Dyspepsia 03/11/2014  . Hypothyroidism 09/11/2012  . ADHD (attention deficit hyperactivity disorder) 09/11/2012  . Anxiety 09/11/2012  . GERD 12/21/2009  . IRRITABLE BOWEL SYNDROME 04/22/2009  . HYPERLIPIDEMIA 07/09/2008  . DEPRESSION 07/09/2008  . HYPERTENSION 07/09/2008  . Gastritis and gastroduodenitis 07/09/2008  . SLEEP APNEA 07/09/2008    Ailene Ravel, OTR/L,CBIS  [phone]   07/15/2019, 9:29 AM  [facility] [address] [place], [place], [address] Phone: [phone]   Fax:  [phone]  Name: Yolanda [name] MRN: [id number] Date of Birth: 12-Dec-1955

## 2019-07-15 NOTE — Telephone Encounter (Signed)
Called PA-C office l/m for [facility] PA - to change OT order from Left Rotator Cuff Tendonitis to Right Rotator Cuff Tendonits. Waiting on correct  order to be faxed to our office.

## 2019-07-17 ENCOUNTER — Encounter (HOSPITAL_COMMUNITY): Payer: Self-pay | Admitting: Specialist

## 2019-07-17 ENCOUNTER — Ambulatory Visit (HOSPITAL_COMMUNITY): Payer: Medicare Other | Admitting: Specialist

## 2019-07-17 ENCOUNTER — Other Ambulatory Visit: Payer: Self-pay

## 2019-07-17 DIAGNOSIS — R29898 Other symptoms and signs involving the musculoskeletal system: Secondary | ICD-10-CM | POA: Diagnosis not present

## 2019-07-17 DIAGNOSIS — M25511 Pain in right shoulder: Secondary | ICD-10-CM

## 2019-07-17 DIAGNOSIS — M25611 Stiffness of right shoulder, not elsewhere classified: Secondary | ICD-10-CM

## 2019-07-19 NOTE — Therapy (Signed)
[facility] [place], [place], [address] Phone: [phone]   Fax:  [phone]  Occupational Therapy Treatment  Patient Details  Name: Yolanda [name] MRN: [id number] Date of Birth: 1956-04-05 Referring Provider (OT): Noemi Chapel [place]   Encounter Date: 07/17/2019  OT End of Session - 07/19/19 0912    Visit Number  2    Number of Visits  12    Date for OT Re-Evaluation  08/25/19    Authorization Type  1) Medicare 2) [employer] senior supplement -No copay. No authorization. 10 th visit progress note due on visit 10    Authorization - Visit Number  2    Authorization - Number of Visits  10    OT Start Time  1430    OT Stop Time  1515    OT Time Calculation (min)  45 min    Activity Tolerance  Patient tolerated treatment well    Behavior During Therapy  WFL for tasks assessed/performed       Past Medical History:  Diagnosis Date  . Abnormal uterine bleeding   . Anxiety   . Arthritis   . Calculus of kidney 07/09/2008   Qualifier: Diagnosis of  By: Kellie Simmering LPN, Almyra Free    . Chronic abdominal pain   . Chronic pain in left foot   . Clotting disorder ([place])   . Depression   . Elevated liver function tests 2018  . Endometriosis   . Fatty liver   . Fibromyalgia   . Gastritis   . GERD 12/21/2009   Qualifier: Diagnosis of  By: Craige Cotta    . Hypertension    stopped meds in Aug 2015  . Hypothyroidism   . IBS (irritable bowel syndrome)   . Internal hemorrhoids   . Kidney stone   . Obesity, morbid ([place]) 09/11/2012  . PE 07/09/2008   Qualifier: Diagnosis of  By: Kellie Simmering LPN, Almyra Free    . PE (pulmonary embolism)   . PONV (postoperative nausea and vomiting)   . Sleep apnea    hasn't worn a CPAP in 6 months  . Thyroid disease   . Vitamin D deficiency     Past Surgical History:  Procedure Laterality Date  . ABDOMINAL SURGERY     laparoscopy  . BIOPSY N/A 03/24/2014   Procedure: GASTRIC BIOPSIES;  Surgeon: Danie Binder,  MD;  Location: [facility];  Service: Endoscopy;  Laterality: N/A;  . BREAST LUMPECTOMY    . CESAREAN SECTION     X2  . COLONOSCOPY  2006   internal hemorrhoids  . COLONOSCOPY WITH PROPOFOL N/A 03/24/2014   Dr. [name]: 2 tubular adenomas removed, hemorrhoids  . CYSTOSCOPY W/ RETROGRADES  01/23/2012   Procedure: CYSTOSCOPY WITH RETROGRADE PYELOGRAM;  Surgeon: Marissa Nestle, MD;  Location: [facility];  Service: Urology;  Laterality: Left;  . DILATATION & CURETTAGE/HYSTEROSCOPY WITH MYOSURE N/A 11/18/2014   Procedure: DILATATION & CURETTAGE/HYSTEROSCOPY WITH MYOSURE, resection of polyp;  Surgeon: Cheri Fowler, MD;  Location: [facility];  Service: Gynecology;  Laterality: N/A;  . DILATION AND CURETTAGE OF UTERUS    . ESOPHAGOGASTRODUODENOSCOPY   08/24/2006   Dr. Veto Kemps erythema of the antrum without erosion or ulcers/Otherwise, normal esophagus without evidence of Barrett's path with chronic gastritis  . ESOPHAGOGASTRODUODENOSCOPY (EGD) WITH PROPOFOL N/A 03/24/2014   Dr. [name]: gastritis  . POLYPECTOMY N/A 03/24/2014   Procedure: POLYPECTOMY;  Surgeon: Danie Binder, MD;  Location: [facility];  Service: Endoscopy;  Laterality: N/A;  .  REMOVAL OF STONES  01/23/2012   Procedure: REMOVAL OF STONES;  Surgeon: Marissa Nestle, MD;  Location: [facility];  Service: Urology;  Laterality: N/A;  . TUBAL LIGATION      There were no vitals filed for this visit.           07/17/19 0001  Exercises  Exercises Shoulder  Shoulder Exercises: Supine  Protraction PROM;AAROM;10 reps  Horizontal ABduction PROM;AAROM;10 reps  External Rotation PROM;AAROM;10 reps  Internal Rotation PROM;AAROM;10 reps  Flexion PROM;AAROM;10 reps  ABduction PROM;AAROM;10 reps  Shoulder Exercises: Seated  Elevation AROM;10 reps  Extension AROM;10 reps  Row AROM;10 reps  Shoulder Exercises: Therapy Ball  Flexion 15 reps  ABduction 15 reps  Shoulder Exercises: ROM/Strengthening  Thumb Tacks 1' low arms  Prot/Ret//Elev/Dep  1' low arms  Manual Therapy  Manual Therapy Myofascial release  Manual therapy comments manual therapy completed seperatley from all other interventions  Myofascial Release myofascial release and manual stretching to right upper arm, shoulder, scapular region, cervical region and associated areas to decrese pain and restrictions and improve pain free mobility                    OT Short Term Goals - 07/15/19 0914      OT SHORT TERM GOAL #1   Title  Patient will be educated and independent with HEP in order to increase functional performance during ADL tasks.    Time  3    Period  Weeks    Status  New    Target Date  08/05/19      OT SHORT TERM GOAL #2   Title  Patient will increase her RUE P/ROM to [facility] in order to increase ability to complete reaching tasks above the shoulder level.    Time  3    Period  Weeks    Status  New        OT Long Term Goals - 07/15/19 7622      OT LONG TERM GOAL #1   Title  Patient will return to highest level of independence while utilizing her RUE and neck for all required ADL tasks 75% or more of the time.    Time  6    Period  Weeks    Status  New    Target Date  08/25/19      OT LONG TERM GOAL #2   Title  Patient will increase RUE and cervical A/ROM to [facility] in order to increase ability to complete functional reaching tasks as well as increase ability to look right and left when driving.    Time  6    Period  Weeks    Status  New      OT LONG TERM GOAL #3   Title  Patient will decrease fascial restrictions to min amount or less in order to increase functional mobility needed to complete reaching tasks above shoulder level.    Time  6    Period  Weeks    Status  New      OT LONG TERM GOAL #4   Title  Patient will report a decrease in pain in the shoulder and/or neck of approximately 3/10 or less during functional daily tasks.    Time  6    Period  Weeks    Status  New            Plan - 07/19/19 0913    Clinical  Impression Statement  A:  Patient able to  complete aa/rom in supine with good form, occassional rest breaks during aa/rom due to muscle spasms in mid thoracic region.  Patient very tender during manual therapy due to her diagnosis of fibromyalgia.    Body Structure / Function / Physical Skills  ADL;ROM;Fascial restriction;Strength;Decreased knowledge of use of DME;Pain;UE functional use    Plan  P:  Continue to improve a/rom needed for completion of functional tasks at shoulder height or above.  increase aa/rom in supine and seated to 12 reps, add wall wash.       Patient will benefit from skilled therapeutic intervention in order to improve the following deficits and impairments:   Body Structure / Function / Physical Skills: ADL, ROM, Fascial restriction, Strength, Decreased knowledge of use of DME, Pain, UE functional use       Visit Diagnosis: Other symptoms and signs involving the musculoskeletal system  Stiffness of right shoulder, not elsewhere classified  Acute pain of right shoulder    Problem List Patient Active Problem List   Diagnosis Date Noted  . History of colonic polyps 01/01/2019  . Liver cirrhosis secondary to NASH ([place]) 01/22/2018  . Dysphagia 04/19/2017  . Abdominal pain, epigastric 04/19/2017  . Abdominal pain 12/08/2014  . Postmenopausal bleeding 11/18/2014  . Closed fracture of distal clavicle 04/17/2014  . Muscle weakness (generalized) 03/26/2014  . Pain in joint, shoulder region 03/26/2014  . Decreased range of motion of right shoulder 03/26/2014  . Right clavicle fracture 03/19/2014  . Fatty liver 03/17/2014  . Rectal bleeding 03/17/2014  . Dyspepsia 03/11/2014  . Hypothyroidism 09/11/2012  . ADHD (attention deficit hyperactivity disorder) 09/11/2012  . Anxiety 09/11/2012  . GERD 12/21/2009  . IRRITABLE BOWEL SYNDROME 04/22/2009  . HYPERLIPIDEMIA 07/09/2008  . DEPRESSION 07/09/2008  . HYPERTENSION 07/09/2008  . Gastritis and gastroduodenitis  07/09/2008  . SLEEP APNEA 07/09/2008    Vangie Bicker, [place], OTR/L [phone]   07/19/2019, 9:18 AM  [facility] [place], [place], [address] Phone: [phone]   Fax:  [phone]  Name: Yolanda [name] MRN: [id number] Date of Birth: 04-30-1956

## 2019-07-24 ENCOUNTER — Other Ambulatory Visit: Payer: Self-pay

## 2019-07-24 ENCOUNTER — Telehealth: Payer: Self-pay | Admitting: *Deleted

## 2019-07-24 ENCOUNTER — Encounter: Payer: Self-pay | Admitting: Gastroenterology

## 2019-07-24 ENCOUNTER — Other Ambulatory Visit: Payer: Self-pay | Admitting: *Deleted

## 2019-07-24 ENCOUNTER — Ambulatory Visit (INDEPENDENT_AMBULATORY_CARE_PROVIDER_SITE_OTHER): Payer: Medicare Other | Admitting: Gastroenterology

## 2019-07-24 ENCOUNTER — Ambulatory Visit (HOSPITAL_COMMUNITY): Payer: Medicare Other | Admitting: Occupational Therapy

## 2019-07-24 ENCOUNTER — Encounter (HOSPITAL_COMMUNITY): Payer: Self-pay | Admitting: Occupational Therapy

## 2019-07-24 DIAGNOSIS — K7581 Nonalcoholic steatohepatitis (NASH): Secondary | ICD-10-CM | POA: Diagnosis not present

## 2019-07-24 DIAGNOSIS — K7460 Unspecified cirrhosis of liver: Secondary | ICD-10-CM

## 2019-07-24 DIAGNOSIS — R1319 Other dysphagia: Secondary | ICD-10-CM

## 2019-07-24 DIAGNOSIS — R29898 Other symptoms and signs involving the musculoskeletal system: Secondary | ICD-10-CM | POA: Diagnosis not present

## 2019-07-24 DIAGNOSIS — Z79899 Other long term (current) drug therapy: Secondary | ICD-10-CM

## 2019-07-24 DIAGNOSIS — M25611 Stiffness of right shoulder, not elsewhere classified: Secondary | ICD-10-CM

## 2019-07-24 DIAGNOSIS — M25511 Pain in right shoulder: Secondary | ICD-10-CM

## 2019-07-24 DIAGNOSIS — K589 Irritable bowel syndrome without diarrhea: Secondary | ICD-10-CM

## 2019-07-24 DIAGNOSIS — R1310 Dysphagia, unspecified: Secondary | ICD-10-CM

## 2019-07-24 MED ORDER — OMEPRAZOLE 20 MG PO CPDR: DELAYED_RELEASE_CAPSULE | ORAL | 3 refills | Status: DC

## 2019-07-24 NOTE — Patient Instructions (Addendum)
FOLLOW A SOFT MECHANICAL DIET UNTIL EGD IS COMPLETE.  MEATS SHOULD BE GROUND ONLY. FRUITS AND VEGGIES SHOULD SOFT LIKE MASHED POTATOES.   EAT TO LIVE AND THINK OF FOOD AS MEDICINE. 75% OF YOUR PLATE SHOULD BE FRUITS/VEGGIES.  To have more energy, and to lose weight:      1. CONTINUE YOUR WEIGHT LOSS EFFORTS. I RECOMMEND YOU READ AND FOLLOW RECOMMENDATIONS BY DR. MARK HYMAN, "10-DAY DETOX DIET".    2. If you must eat bread, EAT EZEKIEL BREAD. IT IS IN THE FROZEN SECTION OF THE GROCERY STORE.    3. DRINK WATER WITH FRUIT OR CUCUMBER ADDED. YOUR URINE SHOULD BE LIGHT YELLOW. AVOID SODA, GATORADE, ENERGY DRINKS, OR DIET SODA.     4. AVOID HIGH FRUCTOSE CORN SYRUP AND CAFFEINE.     5. DO NOT chew SUGAR FREE GUM OR USE ARTIFICIAL SWEETENERS. IF NEEDED USE STEVIA AS A SWEETENER.    6. DO NOT EAT ENRICHED WHEAT FLOUR, PASTA, RICE, OR CEREAL.    7. ONLY EAT WILD CAUGHT SEAFOOD, GRASS FED BEEF OR CHICKEN, PORK FROM PASTURE RAISE PIGS, OR EGGS FROM PASTURE RAISED CHICKENS.    8. PRACTICE CHAIR YOGA FOR 15-30 MINS 3 OR 4 TIMES A WEEK AND PROGRESS TO HATHA YOGA OVER NEXT 6 MOS.    9. START TAKING A MULTIVITAMIN OR VITAMIN B12 DAILY.   ADDITIONAL SUPPLEMENTS TO DECREASE CRAVING AND SUPPRESS YOUR APPETITE:    1. CINNAMON 500 MG EVERY AM PRIOR TO FIRST MEAL.   **STABILIZES BLOOD GLUCOSE/REDUCES CRAVINGS**    2. CHROMIUM 400-500 MG WITH MEALS TWICE DAILY.    **FAT BURNER**    3. GREEN TEA EXTRACT ONE DAILY.   **FAT BURNER/SUPPRESSES YOUR APPETITE**    4. ALPHA LIPOIC ACID TWICE DAILY.   **NATURAL ANTI-INFLAMMATORY SUPPLEMENT THAT IS AN ALTERNATIVE TO IBUPROFEN OR NAPROXEN**    COMPLETE COLONOSCOPY AND UPPER ENDOSCOPY. YOU MAY BRING THE ENEMA TO ADMINISTER IN THE PREOP AREA.   COMPLETE ULTRASOUND EVERY 6 MOS.  GET LABS NEXT JUN OR JUL 2021.   OUTPATIENT VISIT IN 6 MOS.

## 2019-07-24 NOTE — Therapy (Signed)
[facility] [place], [place], [address] Phone: [phone]   Fax:  [phone]  Occupational Therapy Treatment  Patient Details  Name: Yolanda [name] MRN: [id number] Date of Birth: [birth year] Referring Provider (OT): Noemi Chapel [place]   Encounter Date: 07/24/2019  OT End of Session - 07/24/19 1618    Visit Number  3    Number of Visits  12    Date for OT Re-Evaluation  08/25/19    Authorization Type  1) Medicare 2) [employer] senior supplement -No copay. No authorization. 10 th visit progress note due on visit 10    Authorization - Visit Number  3    Authorization - Number of Visits  10    OT Start Time  1516    OT Stop Time  1554    OT Time Calculation (min)  38 min    Activity Tolerance  Patient tolerated treatment well    Behavior During Therapy  WFL for tasks assessed/performed       Past Medical History:  Diagnosis Date  . Abnormal uterine bleeding   . Anxiety   . Arthritis   . Calculus of kidney 07/09/2008   Qualifier: Diagnosis of  By: Kellie Simmering LPN, Almyra Free    . Chronic abdominal pain   . Chronic pain in left foot   . Clotting disorder ([place])   . Depression   . Elevated liver function tests 2018  . Endometriosis   . Fatty liver   . Fibromyalgia   . Gastritis   . GERD 12/21/2009   Qualifier: Diagnosis of  By: Craige Cotta    . Hypertension    stopped meds in Aug 2015  . Hypothyroidism   . IBS (irritable bowel syndrome)   . Internal hemorrhoids   . Kidney stone   . Obesity, morbid ([place]) 09/11/2012  . PE 07/09/2008   Qualifier: Diagnosis of  By: Kellie Simmering LPN, Almyra Free    . PE (pulmonary embolism)   . PONV (postoperative nausea and vomiting)   . Sleep apnea    hasn't worn a CPAP in 6 months  . Thyroid disease   . Vitamin D deficiency     Past Surgical History:  Procedure Laterality Date  . ABDOMINAL SURGERY     laparoscopy  . BIOPSY N/A 03/24/2014   Procedure: GASTRIC BIOPSIES;  Surgeon: Danie Binder, MD;  Location: [facility];  Service: Endoscopy;  Laterality: N/A;  . BREAST LUMPECTOMY    . CESAREAN SECTION     X2  . COLONOSCOPY  2006   internal hemorrhoids  . COLONOSCOPY WITH PROPOFOL N/A 03/24/2014   Dr. [name]: 2 tubular adenomas removed, hemorrhoids  . CYSTOSCOPY W/ RETROGRADES  01/23/2012   Procedure: CYSTOSCOPY WITH RETROGRADE PYELOGRAM;  Surgeon: Marissa Nestle, MD;  Location: [facility];  Service: Urology;  Laterality: Left;  . DILATATION & CURETTAGE/HYSTEROSCOPY WITH MYOSURE N/A 11/18/2014   Procedure: DILATATION & CURETTAGE/HYSTEROSCOPY WITH MYOSURE, resection of polyp;  Surgeon: Cheri Fowler, MD;  Location: [facility];  Service: Gynecology;  Laterality: N/A;  . DILATION AND CURETTAGE OF UTERUS    . ESOPHAGOGASTRODUODENOSCOPY   08/24/2006   Dr. Veto Kemps erythema of the antrum without erosion or ulcers/Otherwise, normal esophagus without evidence of Barrett's path with chronic gastritis  . ESOPHAGOGASTRODUODENOSCOPY (EGD) WITH PROPOFOL N/A 03/24/2014   Dr. [name]: gastritis  . POLYPECTOMY N/A 03/24/2014   Procedure: POLYPECTOMY;  Surgeon: Danie Binder, MD;  Location: [facility];  Service: Endoscopy;  Laterality: N/A;  .  REMOVAL OF STONES  01/23/2012   Procedure: REMOVAL OF STONES;  Surgeon: Marissa Nestle, MD;  Location: [facility];  Service: Urology;  Laterality: N/A;  . TUBAL LIGATION      There were no vitals filed for this visit.  Subjective Assessment - 07/24/19 1517    Subjective   S: It's actually feeling pretty good today.    Currently in Pain?  Yes    Pain Score  2     Pain Location  Shoulder    Pain Orientation  Right    Pain Descriptors / Indicators  Sore    Pain Type  Acute pain    Pain Radiating Towards  N/A    Pain Onset  More than a month ago    Pain Frequency  Constant    Aggravating Factors   increased use/movement    Pain Relieving Factors  heating pad    Effect of Pain on Daily Activities  min/mod effect on ADLs    Multiple Pain Sites  No                    OT Treatments/Exercises (OP) - 07/24/19 1534      Exercises   Exercises  Shoulder      Shoulder Exercises: Supine   Protraction  PROM;5 reps;AROM;10 reps    Horizontal ABduction  PROM;5 reps;AROM;10 reps    External Rotation  PROM;5 reps;AROM;10 reps    Internal Rotation  PROM;5 reps;AROM;10 reps    Flexion  PROM;5 reps;AROM;10 reps    ABduction  PROM;5 reps;AROM;10 reps      Shoulder Exercises: Standing   Protraction  AAROM;10 reps    Horizontal ABduction  AAROM;10 reps    External Rotation  AAROM;10 reps    Internal Rotation  AROM;10 reps    Flexion  AROM;10 reps    ABduction  AAROM;10 reps      Shoulder Exercises: Pulleys   Flexion  1 minute    ABduction  1 minute      Shoulder Exercises: ROM/Strengthening   Thumb Tacks  1'    Proximal Shoulder Strengthening, Supine  10X each, no rest breaks      Manual Therapy   Manual Therapy  Myofascial release    Manual therapy comments  manual therapy completed seperatley from all other interventions    Myofascial Release  myofascial release and manual stretching to right upper arm, shoulder, scapular region, cervical region and associated areas to decrese pain and restrictions and improve pain free mobility               OT Short Term Goals - 07/15/19 0914      OT SHORT TERM GOAL #1   Title  Patient will be educated and independent with HEP in order to increase functional performance during ADL tasks.    Time  3    Period  Weeks    Status  New    Target Date  08/05/19      OT SHORT TERM GOAL #2   Title  Patient will increase her RUE P/ROM to [facility] in order to increase ability to complete reaching tasks above the shoulder level.    Time  3    Period  Weeks    Status  New        OT Long Term Goals - 07/15/19 7564      OT LONG TERM GOAL #1   Title  Patient will return to highest level of independence while utilizing  her RUE and neck for all required ADL tasks 75% or more of the time.     Time  6    Period  Weeks    Status  New    Target Date  08/25/19      OT LONG TERM GOAL #2   Title  Patient will increase RUE and cervical A/ROM to [facility] in order to increase ability to complete functional reaching tasks as well as increase ability to look right and left when driving.    Time  6    Period  Weeks    Status  New      OT LONG TERM GOAL #3   Title  Patient will decrease fascial restrictions to min amount or less in order to increase functional mobility needed to complete reaching tasks above shoulder level.    Time  6    Period  Weeks    Status  New      OT LONG TERM GOAL #4   Title  Patient will report a decrease in pain in the shoulder and/or neck of approximately 3/10 or less during functional daily tasks.    Time  6    Period  Weeks    Status  New            Plan - 07/24/19 1618    Clinical Impression Statement  A: Continued with manual techniques to address fascial restrictions in anterior shoulder and scapular regions. Pt able to tolerate P/ROM WNL today, progressed to A/ROM in supine and completed AA/ROM in standing. Pt completed thumb tacks at shoulder height without difficulty, added pulleys this session. Verbal cuing for form and technique.    Body Structure / Function / Physical Skills  ADL;ROM;Fascial restriction;Strength;Decreased knowledge of use of DME;Pain;UE functional use    Plan  P: Continue with A/ROM and add in standing, update HEP       Patient will benefit from skilled therapeutic intervention in order to improve the following deficits and impairments:   Body Structure / Function / Physical Skills: ADL, ROM, Fascial restriction, Strength, Decreased knowledge of use of DME, Pain, UE functional use       Visit Diagnosis: Other symptoms and signs involving the musculoskeletal system  Stiffness of right shoulder, not elsewhere classified  Acute pain of right shoulder    Problem List Patient Active Problem List   Diagnosis Date  Noted  . History of colonic polyps 01/01/2019  . Liver cirrhosis secondary to NASH ([place]) 01/22/2018  . Dysphagia 04/19/2017  . Abdominal pain, epigastric 04/19/2017  . Abdominal pain 12/08/2014  . Postmenopausal bleeding 11/18/2014  . Closed fracture of distal clavicle 04/17/2014  . Muscle weakness (generalized) 03/26/2014  . Pain in joint, shoulder region 03/26/2014  . Decreased range of motion of right shoulder 03/26/2014  . Right clavicle fracture 03/19/2014  . Fatty liver 03/17/2014  . Rectal bleeding 03/17/2014  . Dyspepsia 03/11/2014  . Hypothyroidism 09/11/2012  . ADHD (attention deficit hyperactivity disorder) 09/11/2012  . Anxiety 09/11/2012  . GERD 12/21/2009  . IRRITABLE BOWEL SYNDROME 04/22/2009  . HYPERLIPIDEMIA 07/09/2008  . DEPRESSION 07/09/2008  . HYPERTENSION 07/09/2008  . Gastritis and gastroduodenitis 07/09/2008  . SLEEP APNEA 07/09/2008   Guadelupe Sabin, OTR/L  [phone] 07/24/2019, 4:21 PM  [facility] [address] [place], [place], [address] Phone: [phone]   Fax:  [phone]  Name: ERION HERMANS MRN: [id number] Date of Birth: [birth year]

## 2019-07-24 NOTE — Telephone Encounter (Signed)
Patient left prior to being scheduled. She needs abd [place] and TCS/EGD/+/-DIL with propofol with SLF  LMOVM for pt  [place] scheduled for 07/31/2019 at 8:30am, arrival 8:15am, npo midnight

## 2019-07-24 NOTE — Assessment & Plan Note (Addendum)
SYMPTOMS NOT IDEALLY CONTROLLED.  EGD WITH POSSIBLE DILATION IN FUTURE. SOFT MECHANICAL DIET UNTIL EGD IS COMPLETE.

## 2019-07-24 NOTE — Progress Notes (Signed)
Subjective:    Patient ID: Yolanda [name], female    DOB: August 13, 1955, 64 y.o.   MRN: [id number]  Sharilyn Sites, MD  HPI HEARTBURN FAIRLY WELL CONTROLLED. GAINED UP TO 289 LBS AND THEN WENT DOWN TO 269 LBS. HERE TO SCHEDULE EGD AND TCS. INTERESTED IN LOSING WEIGHT. GETTING A LOT OF NOSEBLEEDS. USING HEAT MORE. ALWAYS HAS PAININ HER BELLY BU NTO WORSE(UPPER ABDOMEN). THINKS IT MAY BE FIBROMYALGIA.  PT DENIES FEVER, CHILLS, HEMATOCHEZIA, HEMATEMESIS, nausea, vomiting, melena, diarrhea, CHEST PAIN, SHORTNESS OF BREATH,  CHANGE IN BOWEL IN HABITS, constipation, abdominal pain, problems swallowing, problems with sedation, heartburn or indigestion.  Past Medical History:  Diagnosis Date  . Abnormal uterine bleeding   . Anxiety   . Arthritis   . Calculus of kidney 07/09/2008   Qualifier: Diagnosis of  By: Kellie Simmering LPN, Almyra Free    . Chronic abdominal pain   . Chronic pain in left foot   . Clotting disorder ([place])   . Depression   . Elevated liver function tests 2018  . Endometriosis   . Fatty liver   . Fibromyalgia   . Gastritis   . GERD 12/21/2009   Qualifier: Diagnosis of  By: Craige Cotta    . Hypertension    stopped meds in Aug 2015  . Hypothyroidism   . IBS (irritable bowel syndrome)   . Internal hemorrhoids   . Kidney stone   . Obesity, morbid ([place]) 09/11/2012  . PE 07/09/2008   Qualifier: Diagnosis of  By: Kellie Simmering LPN, Almyra Free    . PE (pulmonary embolism)   . PONV (postoperative nausea and vomiting)   . Sleep apnea    hasn't worn a CPAP in 6 months  . Thyroid disease   . Vitamin D deficiency    Past Surgical History:  Procedure Laterality Date  . ABDOMINAL SURGERY     laparoscopy  . BIOPSY N/A 03/24/2014   Procedure: GASTRIC BIOPSIES;  Surgeon: Danie Binder, MD;  Location: [facility];  Service: Endoscopy;  Laterality: N/A;  . BREAST LUMPECTOMY    . CESAREAN SECTION     X2  . COLONOSCOPY  2006   internal hemorrhoids  . COLONOSCOPY WITH PROPOFOL N/A 03/24/2014   Dr.  [name]: 2 tubular adenomas removed, hemorrhoids  . CYSTOSCOPY W/ RETROGRADES  01/23/2012   Procedure: CYSTOSCOPY WITH RETROGRADE PYELOGRAM;  Surgeon: Marissa Nestle, MD;  Location: [facility];  Service: Urology;  Laterality: Left;  . DILATATION & CURETTAGE/HYSTEROSCOPY WITH MYOSURE N/A 11/18/2014   Procedure: DILATATION & CURETTAGE/HYSTEROSCOPY WITH MYOSURE, resection of polyp;  Surgeon: Cheri Fowler, MD;  Location: [facility];  Service: Gynecology;  Laterality: N/A;  . DILATION AND CURETTAGE OF UTERUS    . ESOPHAGOGASTRODUODENOSCOPY   08/24/2006   Dr. Veto Kemps erythema of the antrum without erosion or ulcers/Otherwise, normal esophagus without evidence of Barrett's path with chronic gastritis  . ESOPHAGOGASTRODUODENOSCOPY (EGD) WITH PROPOFOL N/A 03/24/2014   Dr. [name]: gastritis  . POLYPECTOMY N/A 03/24/2014   Procedure: POLYPECTOMY;  Surgeon: Danie Binder, MD;  Location: [facility];  Service: Endoscopy;  Laterality: N/A;  . REMOVAL OF STONES  01/23/2012   Procedure: REMOVAL OF STONES;  Surgeon: Marissa Nestle, MD;  Location: [facility];  Service: Urology;  Laterality: N/A;  . TUBAL LIGATION     Allergies  Allergen Reactions  . Azithromycin Hives  . Lisinopril     cough  . Sulfonamide Derivatives Other (See Comments)    Vaginal Infection     Current Outpatient  Medications  Medication Sig    . aspirin 325 MG tablet Take 325 mg by mouth daily.    . Cholecalciferol (VITAMIN D3) 2000 units TABS Take 1 tablet by mouth daily.    Marland Kitchen CRANBERRY-VITAMIN C PO Take by mouth daily.    . cyclobenzaprine (FLEXERIL) 10 MG tablet Take 10 mg by mouth daily.    . DULoxetine (CYMBALTA) 30 MG capsule Take 1 capsule by mouth daily.    Marland Kitchen gabapentin (NEURONTIN) 100 MG capsule Take 100 mg by mouth daily.    . Levothyroxine Sodium 100 MCG CAPS Take 100 mcg by mouth daily before breakfast.     . meloxicam (MOBIC) 7.5 MG tablet Take 7.5 mg by mouth as needed for pain.    Marland Kitchen olmesartan-hydrochlorothiazide (BENICAR  HCT) 40-25 MG tablet Take 1 tablet by mouth daily.    . Omega-3 Fatty Acids (FISH OIL) 1000 MG CAPS Take 1 capsule by mouth daily.    Marland Kitchen omeprazole (PRILOSEC) 20 MG capsule 1 po 30 mins prior to [place].    Marland Kitchen ondansetron (ZOFRAN) 4 MG tablet Take 4 mg by mouth every 8 (eight) hours as needed for nausea or vomiting.    . rosuvastatin (CRESTOR) 10 MG tablet Take 10 mg by mouth daily.    . Turmeric 500 MG TABS Take 1 tablet by mouth daily.    Glory Rosebush DELICA LANCETS 83M MISC     . ONETOUCH VERIO test strip     . RESTASIS MULTIDOSE 0.05 % ophthalmic emulsion Apply 1 drop to eye as needed.      Review of Systems PER HPI OTHERWISE ALL SYSTEMS ARE NEGATIVE.    Objective:   Physical Exam Constitutional:      General: She is not in acute distress.    Appearance: Normal appearance.  HENT:     Mouth/Throat:     Comments: MASK IN PLACE Eyes:     General: No scleral icterus.    Pupils: Pupils are equal, round, and reactive to light.  Cardiovascular:     Rate and Rhythm: Normal rate and regular rhythm.     Pulses: Normal pulses.     Heart sounds: Normal heart sounds.  Pulmonary:     Effort: Pulmonary effort is normal.     Breath sounds: Normal breath sounds.  Abdominal:     General: Bowel sounds are normal.     Palpations: Abdomen is soft.     Tenderness: There is abdominal tenderness. There is no guarding or rebound.     Comments: MODERATE TTP IN BUQs  IN THE EPIGASTRIUM  Musculoskeletal:     Cervical back: Normal range of motion. Tenderness (LEFT SUBMANDIBULAR REGION) present.     Right lower leg: No edema.     Left lower leg: No edema.  Lymphadenopathy:     Cervical: No cervical adenopathy.  Skin:    General: Skin is warm and dry.  Neurological:     Mental Status: She is alert and oriented to person, place, and time.     Comments: NO  NEW FOCAL DEFICITS  Psychiatric:        Mood and Affect: Mood normal.     Comments: NORMAL AFFECT       Assessment & Plan:

## 2019-07-24 NOTE — Assessment & Plan Note (Signed)
EAT TO LIVE AND THINK OF FOOD AS MEDICINE. 75% OF YOUR PLATE SHOULD BE FRUITS/VEGGIES.  To have more energy, and to lose weight:      1. CONTINUE YOUR WEIGHT LOSS EFFORTS. I RECOMMEND YOU READ AND FOLLOW RECOMMENDATIONS BY DR. MARK HYMAN, "10-DAY DETOX DIET".    2. If you must eat bread, EAT EZEKIEL BREAD. IT IS IN THE FROZEN SECTION OF THE GROCERY STORE.    3. DRINK WATER WITH FRUIT OR CUCUMBER ADDED. YOUR URINE SHOULD BE LIGHT YELLOW. AVOID SODA, GATORADE, ENERGY DRINKS, OR DIET SODA.     4. AVOID HIGH FRUCTOSE CORN SYRUP AND CAFFEINE.     5. DO NOT chew SUGAR FREE GUM OR USE ARTIFICIAL SWEETENERS. IF NEEDED USE STEVIA AS A SWEETENER.    6. DO NOT EAT ENRICHED WHEAT FLOUR, PASTA, RICE, OR CEREAL.    7. ONLY EAT WILD CAUGHT SEAFOOD, GRASS FED BEEF OR CHICKEN, PORK FROM PASTURE RAISE PIGS, OR EGGS FROM PASTURE RAISED CHICKENS.    8. PRACTICE CHAIR YOGA FOR 15-30 MINS 3 OR 4 TIMES A WEEK AND PROGRESS TO HATHA YOGA OVER NEXT 6 MOS.    9. START TAKING A MULTIVITAMIN OR VITAMIN B12 DAILY.   ADDITIONAL SUPPLEMENTS TO DECREASE CRAVING AND SUPPRESS YOUR APPETITE:    1. CINNAMON 500 MG EVERY AM PRIOR TO FIRST MEAL.   **STABILIZES BLOOD GLUCOSE/REDUCES CRAVINGS**    2. CHROMIUM 400-500 MG WITH MEALS TWICE DAILY.    **FAT BURNER**    3. GREEN TEA EXTRACT ONE DAILY.   **FAT BURNER/SUPPRESSES YOUR APPETITE**    4. ALPHA LIPOIC ACID TWICE DAILY.   **NATURAL ANTI-INFLAMMATORY SUPPLEMENT THAT IS AN ALTERNATIVE TO IBUPROFEN OR NAPROXEN** COMPLETE COLONOSCOPY AND UPPER ENDOSCOPY. YOU MAY BRING THE ENEMA TO ADMINISTER IN THE PREOP AREA.  DISCUSSED PROCEDURE, BENEFITS, & RISKS: < 1% chance of medication reaction, bleeding, perforation, or ASPIRATION. COMPLETE ULTRASOUND EVERY 6 MOS. GET LABS NEXT JUN OR JUL 2021. OUTPATIENT VISIT IN 6 MOS.

## 2019-07-25 NOTE — Telephone Encounter (Signed)
LMOVM for pt 

## 2019-07-28 ENCOUNTER — Telehealth (HOSPITAL_COMMUNITY): Payer: Self-pay | Admitting: Occupational Therapy

## 2019-07-28 ENCOUNTER — Ambulatory Visit (HOSPITAL_COMMUNITY): Payer: Medicare Other | Admitting: Occupational Therapy

## 2019-07-28 NOTE — Telephone Encounter (Signed)
pt called to cx today's appt due to she is not feeling well

## 2019-07-28 NOTE — Progress Notes (Signed)
Cc'ed to pcp °

## 2019-07-28 NOTE — Telephone Encounter (Signed)
Called patient. She wants to hold off on scheduling EGD until she can have TCS done at the same time. Patient aware we will call to schedule once we start scheduling screening procedures again.

## 2019-07-29 ENCOUNTER — Telehealth (HOSPITAL_COMMUNITY): Payer: Self-pay | Admitting: Occupational Therapy

## 2019-07-29 NOTE — Telephone Encounter (Signed)
pt is still not feeling well so she cancelled appt for 1/20

## 2019-07-30 ENCOUNTER — Ambulatory Visit (HOSPITAL_COMMUNITY): Payer: Medicare Other | Admitting: Occupational Therapy

## 2019-07-31 ENCOUNTER — Other Ambulatory Visit: Payer: Self-pay

## 2019-07-31 ENCOUNTER — Ambulatory Visit (HOSPITAL_COMMUNITY)
Admission: RE | Admit: 2019-07-31 | Discharge: 2019-07-31 | Disposition: A | Discharge status: Home / Self Care | Payer: Medicare Other | Source: Ambulatory Visit | Attending: Gastroenterology | Admitting: Gastroenterology

## 2019-07-31 DIAGNOSIS — K7460 Unspecified cirrhosis of liver: Secondary | ICD-10-CM | POA: Insufficient documentation

## 2019-07-31 DIAGNOSIS — Z1231 Encounter for screening mammogram for malignant neoplasm of breast: Secondary | ICD-10-CM | POA: Diagnosis present

## 2019-08-01 ENCOUNTER — Encounter: Payer: Self-pay | Admitting: Gastroenterology

## 2019-08-01 NOTE — Progress Notes (Signed)
PATIENT SCHEDULED AND ON RECALL

## 2019-08-04 ENCOUNTER — Other Ambulatory Visit: Payer: Self-pay

## 2019-08-04 ENCOUNTER — Encounter (HOSPITAL_COMMUNITY): Payer: Self-pay

## 2019-08-04 ENCOUNTER — Ambulatory Visit (HOSPITAL_COMMUNITY): Payer: Medicare Other

## 2019-08-04 DIAGNOSIS — R29898 Other symptoms and signs involving the musculoskeletal system: Secondary | ICD-10-CM

## 2019-08-04 DIAGNOSIS — M25511 Pain in right shoulder: Secondary | ICD-10-CM

## 2019-08-04 DIAGNOSIS — M25611 Stiffness of right shoulder, not elsewhere classified: Secondary | ICD-10-CM

## 2019-08-04 MED ORDER — PEG 3350-KCL-NA BICARB-NACL 420 G PO SOLR: 4000.0000 mL | ORAL | 0 refills | Status: DC

## 2019-08-04 NOTE — Patient Instructions (Signed)
Repeat all exercises 10-15 times, 1-2 times per day.  1) Shoulder Protraction    Begin with elbows by your side, slowly "punch" straight out in front of you.      2) Shoulder Flexion  Standing:         Begin with arms at your side with thumbs pointed up, slowly raise both arms up and forward towards overhead.       3) Horizontal abduction/adduction   Standing:           Begin with arms straight out in front of you, bring out to the side in at "T" shape. Keep arms straight entire time.        4) Internal & External Rotation    *No band* -Stand with elbows at the side and elbows bent 90 degrees. Move your forearms away from your body, then bring back inward toward the body.     5) Shoulder Abduction   Standing:       Lying on your back begin with your arms flat on the table next to your side. Slowly move your arms out to the side so that they go overhead, in a jumping jack or snow angel movement.    6) X to V arms (cheerleader move):  Begin with arms straight down, crossed in front of body in an "X". Keeping arms crossed, lift arms straight up overhead. Then spread arms apart into a "V" shape.  Bring back together into x and lower down to starting position.    

## 2019-08-04 NOTE — Therapy (Signed)
[place], [place], [address] Phone: [phone]   Fax:  [phone]  Occupational Therapy Treatment  Patient Details  Name: Yolanda [name] MRN: [id number] Date of Birth: 02-01-1956 Referring Provider (OT): Noemi Chapel [place]   Encounter Date: 08/04/2019  OT End of Session - 08/04/19 1424    Visit Number  4    Number of Visits  12    Date for OT Re-Evaluation  08/25/19    Authorization Type  1) Medicare 2) [employer] senior supplement -No copay. No authorization. 10 th visit progress note due on visit 10    Authorization - Visit Number  4    Authorization - Number of Visits  10    OT Start Time  2426    OT Stop Time  1425    OT Time Calculation (min)  40 min    Activity Tolerance  Patient tolerated treatment well    Behavior During Therapy  WFL for tasks assessed/performed       Past Medical History:  Diagnosis Date  . Abnormal uterine bleeding   . Anxiety   . Arthritis   . Calculus of kidney 07/09/2008   Qualifier: Diagnosis of  By: Kellie Simmering LPN, Almyra Free    . Chronic abdominal pain   . Chronic pain in left foot   . Clotting disorder ([place])   . Depression   . Elevated liver function tests 2018  . Endometriosis   . Fatty liver   . Fibromyalgia   . Gastritis   . GERD 12/21/2009   Qualifier: Diagnosis of  By: Craige Cotta    . Hypertension    stopped meds in Aug 2015  . Hypothyroidism   . IBS (irritable bowel syndrome)   . Internal hemorrhoids   . Kidney stone   . Obesity, morbid ([place]) 09/11/2012  . PE 07/09/2008   Qualifier: Diagnosis of  By: Kellie Simmering LPN, Almyra Free    . PE (pulmonary embolism)   . PONV (postoperative nausea and vomiting)   . Sleep apnea    hasn't worn a CPAP in 6 months  . Thyroid disease   . Vitamin D deficiency     Past Surgical History:  Procedure Laterality Date  . ABDOMINAL SURGERY     laparoscopy  . BIOPSY N/A 03/24/2014   Procedure: GASTRIC BIOPSIES;  Surgeon: Danie Binder, MD;  Location: [facility];  Service: Endoscopy;  Laterality: N/A;  . BREAST LUMPECTOMY    . CESAREAN SECTION     X2  . COLONOSCOPY  2006   internal hemorrhoids  . COLONOSCOPY WITH PROPOFOL N/A 03/24/2014   Dr. [name]: 2 tubular adenomas removed, hemorrhoids  . CYSTOSCOPY W/ RETROGRADES  01/23/2012   Procedure: CYSTOSCOPY WITH RETROGRADE PYELOGRAM;  Surgeon: Marissa Nestle, MD;  Location: [facility];  Service: Urology;  Laterality: Left;  . DILATATION & CURETTAGE/HYSTEROSCOPY WITH MYOSURE N/A 11/18/2014   Procedure: DILATATION & CURETTAGE/HYSTEROSCOPY WITH MYOSURE, resection of polyp;  Surgeon: Cheri Fowler, MD;  Location: [facility];  Service: Gynecology;  Laterality: N/A;  . DILATION AND CURETTAGE OF UTERUS    . ESOPHAGOGASTRODUODENOSCOPY   08/24/2006   Dr. Veto Kemps erythema of the antrum without erosion or ulcers/Otherwise, normal esophagus without evidence of Barrett's path with chronic gastritis  . ESOPHAGOGASTRODUODENOSCOPY (EGD) WITH PROPOFOL N/A 03/24/2014   Dr. [name]: gastritis  . POLYPECTOMY N/A 03/24/2014   Procedure: POLYPECTOMY;  Surgeon: Danie Binder, MD;  Location: [facility];  Service: Endoscopy;  Laterality: N/A;  .  REMOVAL OF STONES  01/23/2012   Procedure: REMOVAL OF STONES;  Surgeon: Marissa Nestle, MD;  Location: [facility];  Service: Urology;  Laterality: N/A;  . TUBAL LIGATION      There were no vitals filed for this visit.      [facility] OT Assessment - 08/04/19 1403      Assessment   Medical Diagnosis  Right shoulder RTC tendonsis and cervicalgia      Precautions   Precautions  None               OT Treatments/Exercises (OP) - 08/04/19 1404      Exercises   Exercises  Shoulder;Neck      Shoulder Exercises: Supine   Protraction  PROM;5 reps;AROM;15 reps    Horizontal ABduction  PROM;5 reps;AROM;15 reps    External Rotation  PROM;5 reps;AROM;15 reps    Internal Rotation  PROM;5 reps;AROM;15 reps    Flexion  PROM;5 reps;AROM;12 reps     ABduction  PROM;5 reps;AROM;10 reps      Shoulder Exercises: Standing   Protraction  AROM;10 reps    Horizontal ABduction  AROM;10 reps   seated   External Rotation  AROM;10 reps    Internal Rotation  AROM;10 reps    Flexion  AROM;10 reps    ABduction  AROM;10 reps   seated     Shoulder Exercises: ROM/Strengthening   UBE (Upper Arm Bike)  level 3 2' forward 2' reverse   pace: 10.0-11.5   "W" Arms  12X     X to V Arms  A/ROM 10X    Proximal Shoulder Strengthening, Supine  10X each, no rest breaks    Proximal Shoulder Strengthening, Seated  10X each, no rest breaks    Graduated Retraction with Theraband  Proximal shoulder strengthening with washcloth on wall; seated due to vertigo      Manual Therapy   Manual Therapy  Myofascial release    Manual therapy comments  manual therapy completed seperatley from all other interventions    Myofascial Release  myofascial release and manual stretching to right upper arm, shoulder, scapular region, cervical region and associated areas to decrese pain and restrictions and improve pain free mobility             OT Education - 08/04/19 1419    Education Details  Updated HEP: Continue with neck stretches and ROM. Added shoulder A/ROM exercises    Person(s) Educated  Patient    Methods  Explanation;Demonstration;Verbal cues;Handout    Comprehension  Returned demonstration;Verbalized understanding       OT Short Term Goals - 08/04/19 1430      OT SHORT TERM GOAL #1   Title  Patient will be educated and independent with HEP in order to increase functional performance during ADL tasks.    Time  3    Period  Weeks    Status  On-going    Target Date  08/05/19      OT SHORT TERM GOAL #2   Title  Patient will increase her RUE P/ROM to [facility] in order to increase ability to complete reaching tasks above the shoulder level.    Time  3    Period  Weeks    Status  On-going        OT Long Term Goals - 08/04/19 1430      OT LONG TERM GOAL  #1   Title  Patient will return to highest level of independence while utilizing her RUE and neck  for all required ADL tasks 75% or more of the time.    Time  6    Period  Weeks    Status  On-going      OT LONG TERM GOAL #2   Title  Patient will increase RUE and cervical A/ROM to [facility] in order to increase ability to complete functional reaching tasks as well as increase ability to look right and left when driving.    Time  6    Period  Weeks    Status  On-going      OT LONG TERM GOAL #3   Title  Patient will decrease fascial restrictions to min amount or less in order to increase functional mobility needed to complete reaching tasks above shoulder level.    Time  6    Period  Weeks    Status  On-going      OT LONG TERM GOAL #4   Title  Patient will report a decrease in pain in the shoulder and/or neck of approximately 3/10 or less during functional daily tasks.    Time  6    Period  Weeks    Status  On-going            Plan - 08/04/19 1425    Clinical Impression Statement  A: Patient experienced vertigo during session and exercises were modified in seated as needed. VC for form and technique were provided. Manual techniques were provided to address fascial restrictions in the right upper arm region. HEP was updated with patient able to demonstrate understanding. Pain felt close to end range during passive stretching. Mucle fatigue noted at end of session.    Body Structure / Function / Physical Skills  ADL;ROM;Fascial restriction;Strength;Decreased knowledge of use of DME;Pain;UE functional use    Plan  P: Add theraband scapular strengthening with red band.    Consulted and Agree with Plan of Care  Patient       Patient will benefit from skilled therapeutic intervention in order to improve the following deficits and impairments:   Body Structure / Function / Physical Skills: ADL, ROM, Fascial restriction, Strength, Decreased knowledge of use of DME, Pain, UE functional use        Visit Diagnosis: Stiffness of right shoulder, not elsewhere classified  Other symptoms and signs involving the musculoskeletal system  Acute pain of right shoulder    Problem List Patient Active Problem List   Diagnosis Date Noted  . History of colonic polyps 01/01/2019  . Liver cirrhosis secondary to NASH ([place]) 01/22/2018  . Dysphagia 04/19/2017  . Abdominal pain, epigastric 04/19/2017  . Abdominal pain 12/08/2014  . Postmenopausal bleeding 11/18/2014  . Closed fracture of distal clavicle 04/17/2014  . Muscle weakness (generalized) 03/26/2014  . Pain in joint, shoulder region 03/26/2014  . Decreased range of motion of right shoulder 03/26/2014  . Right clavicle fracture 03/19/2014  . Fatty liver 03/17/2014  . Rectal bleeding 03/17/2014  . Dyspepsia 03/11/2014  . Hypothyroidism 09/11/2012  . ADHD (attention deficit hyperactivity disorder) 09/11/2012  . Anxiety 09/11/2012  . GERD 12/21/2009  . IRRITABLE BOWEL SYNDROME 04/22/2009  . HYPERLIPIDEMIA 07/09/2008  . DEPRESSION 07/09/2008  . HYPERTENSION 07/09/2008  . Gastritis and gastroduodenitis 07/09/2008  . SLEEP APNEA 07/09/2008   Ailene Ravel, OTR/L,CBIS  [phone]  08/04/2019, 2:30 PM  [place] [address] [place], [place], [address] Phone: [phone]   Fax:  [phone]  Name: Yolanda [name] MRN: [id number] Date of Birth: 1955-09-16

## 2019-08-06 ENCOUNTER — Ambulatory Visit (HOSPITAL_COMMUNITY): Payer: Medicare Other | Admitting: Occupational Therapy

## 2019-08-06 ENCOUNTER — Other Ambulatory Visit: Payer: Self-pay

## 2019-08-06 ENCOUNTER — Encounter (HOSPITAL_COMMUNITY): Payer: Self-pay | Admitting: Occupational Therapy

## 2019-08-06 DIAGNOSIS — R29898 Other symptoms and signs involving the musculoskeletal system: Secondary | ICD-10-CM | POA: Diagnosis not present

## 2019-08-06 DIAGNOSIS — M25511 Pain in right shoulder: Secondary | ICD-10-CM

## 2019-08-06 DIAGNOSIS — M25611 Stiffness of right shoulder, not elsewhere classified: Secondary | ICD-10-CM

## 2019-08-06 NOTE — Therapy (Signed)
[place], [place], [address] Phone: [phone]   Fax:  [phone]  Occupational Therapy Treatment  Patient Details  Name: Yolanda [name] MRN: [id number] Date of Birth: 1956/06/22 Referring Provider (OT): Noemi Chapel [place]   Encounter Date: 08/06/2019  OT End of Session - 08/06/19 1023    Visit Number  5    Number of Visits  12    Date for OT Re-Evaluation  08/25/19    Authorization Type  1) Medicare 2) [employer] senior supplement -No copay. No authorization. 10 th visit progress note due on visit 10    Authorization - Visit Number  5    Authorization - Number of Visits  10    OT Start Time  [phone]    OT Stop Time  1023    OT Time Calculation (min)  39 min    Activity Tolerance  Patient tolerated treatment well    Behavior During Therapy  WFL for tasks assessed/performed       Past Medical History:  Diagnosis Date  . Abnormal uterine bleeding   . Anxiety   . Arthritis   . Calculus of kidney 07/09/2008   Qualifier: Diagnosis of  By: Kellie Simmering LPN, Almyra Free    . Chronic abdominal pain   . Chronic pain in left foot   . Clotting disorder ([place])   . Depression   . Elevated liver function tests 2018  . Endometriosis   . Fatty liver   . Fibromyalgia   . Gastritis   . GERD 12/21/2009   Qualifier: Diagnosis of  By: Craige Cotta    . Hypertension    stopped meds in Aug 2015  . Hypothyroidism   . IBS (irritable bowel syndrome)   . Internal hemorrhoids   . Kidney stone   . Obesity, morbid ([place]) 09/11/2012  . PE 07/09/2008   Qualifier: Diagnosis of  By: Kellie Simmering LPN, Almyra Free    . PE (pulmonary embolism)   . PONV (postoperative nausea and vomiting)   . Sleep apnea    hasn't worn a CPAP in 6 months  . Thyroid disease   . Vitamin D deficiency     Past Surgical History:  Procedure Laterality Date  . ABDOMINAL SURGERY     laparoscopy  . BIOPSY N/A 03/24/2014   Procedure: GASTRIC BIOPSIES;  Surgeon: Danie Binder, MD;  Location: [facility];  Service: Endoscopy;  Laterality: N/A;  . BREAST LUMPECTOMY    . CESAREAN SECTION     X2  . COLONOSCOPY  2006   internal hemorrhoids  . COLONOSCOPY WITH PROPOFOL N/A 03/24/2014   Dr. [name]: 2 tubular adenomas removed, hemorrhoids  . CYSTOSCOPY W/ RETROGRADES  01/23/2012   Procedure: CYSTOSCOPY WITH RETROGRADE PYELOGRAM;  Surgeon: Marissa Nestle, MD;  Location: [facility];  Service: Urology;  Laterality: Left;  . DILATATION & CURETTAGE/HYSTEROSCOPY WITH MYOSURE N/A 11/18/2014   Procedure: DILATATION & CURETTAGE/HYSTEROSCOPY WITH MYOSURE, resection of polyp;  Surgeon: Cheri Fowler, MD;  Location: [facility];  Service: Gynecology;  Laterality: N/A;  . DILATION AND CURETTAGE OF UTERUS    . ESOPHAGOGASTRODUODENOSCOPY   08/24/2006   Dr. Veto Kemps erythema of the antrum without erosion or ulcers/Otherwise, normal esophagus without evidence of Barrett's path with chronic gastritis  . ESOPHAGOGASTRODUODENOSCOPY (EGD) WITH PROPOFOL N/A 03/24/2014   Dr. [name]: gastritis  . POLYPECTOMY N/A 03/24/2014   Procedure: POLYPECTOMY;  Surgeon: Danie Binder, MD;  Location: [facility];  Service: Endoscopy;  Laterality: N/A;  .  REMOVAL OF STONES  01/23/2012   Procedure: REMOVAL OF STONES;  Surgeon: Marissa Nestle, MD;  Location: [facility];  Service: Urology;  Laterality: N/A;  . TUBAL LIGATION      There were no vitals filed for this visit.  Subjective Assessment - 08/06/19 0943    Subjective   S: It's a little bit sore today.    Currently in Pain?  Yes    Pain Score  1     Pain Location  Shoulder    Pain Orientation  Right    Pain Descriptors / Indicators  Sore    Pain Type  Acute pain    Pain Radiating Towards  N/A    Pain Onset  More than a month ago    Pain Frequency  Constant    Aggravating Factors   increased use/movement    Pain Relieving Factors  heating pad    Effect of Pain on Daily Activities  min effect on ADLs    Multiple Pain Sites  No         [facility] OT  Assessment - 08/06/19 0942      Assessment   Medical Diagnosis  Right shoulder RTC tendonsis and cervicalgia      Precautions   Precautions  None               OT Treatments/Exercises (OP) - 08/06/19 0947      Exercises   Exercises  Shoulder      Shoulder Exercises: Supine   Protraction  PROM;5 reps;AROM;15 reps    Horizontal ABduction  PROM;5 reps;AROM;15 reps    External Rotation  PROM;5 reps;AROM;15 reps    Internal Rotation  PROM;5 reps;AROM;15 reps    Flexion  PROM;5 reps;AROM;12 reps    ABduction  PROM;5 reps;AROM;10 reps      Shoulder Exercises: Standing   Protraction  AROM;12 reps    Horizontal ABduction  AROM;12 reps    External Rotation  AROM;12 reps    Internal Rotation  AROM;12 reps    Flexion  AROM;12 reps    ABduction  AROM;12 reps    Extension  Theraband;10 reps    Theraband Level (Shoulder Extension)  Level 2 (Red)    Row  Theraband;10 reps    Theraband Level (Shoulder Row)  Level 2 (Red)    Retraction  Theraband;10 reps    Theraband Level (Shoulder Retraction)  Level 2 (Red)      Shoulder Exercises: Therapy Ball   Other Therapy Ball Exercises  dark green therapy ball: chest press, flexion, overhead press, circles each direction, 10X      Shoulder Exercises: ROM/Strengthening   "W" Arms  12X     X to V Arms  10X A/ROM    Proximal Shoulder Strengthening, Supine  10X each, no rest breaks    Proximal Shoulder Strengthening, Seated  10X each, no rest breaks    Other ROM/Strengthening Exercises  proximal shoulder strengthening on doorway, 1' flexion      Manual Therapy   Manual Therapy  Myofascial release    Manual therapy comments  manual therapy completed seperatley from all other interventions    Myofascial Release  myofascial release and manual stretching to right upper arm, shoulder, scapular region, cervical region and associated areas to decrese pain and restrictions and improve pain free mobility             OT Education -  08/06/19 1014    Education Details  red scapular theraband  Person(s) Educated  Patient    Methods  Explanation;Demonstration;Verbal cues;Handout    Comprehension  Returned demonstration;Verbalized understanding       OT Short Term Goals - 08/04/19 1430      OT SHORT TERM GOAL #1   Title  Patient will be educated and independent with HEP in order to increase functional performance during ADL tasks.    Time  3    Period  Weeks    Status  On-going    Target Date  08/05/19      OT SHORT TERM GOAL #2   Title  Patient will increase her RUE P/ROM to [facility] in order to increase ability to complete reaching tasks above the shoulder level.    Time  3    Period  Weeks    Status  On-going        OT Long Term Goals - 08/04/19 1430      OT LONG TERM GOAL #1   Title  Patient will return to highest level of independence while utilizing her RUE and neck for all required ADL tasks 75% or more of the time.    Time  6    Period  Weeks    Status  On-going      OT LONG TERM GOAL #2   Title  Patient will increase RUE and cervical A/ROM to [facility] in order to increase ability to complete functional reaching tasks as well as increase ability to look right and left when driving.    Time  6    Period  Weeks    Status  On-going      OT LONG TERM GOAL #3   Title  Patient will decrease fascial restrictions to min amount or less in order to increase functional mobility needed to complete reaching tasks above shoulder level.    Time  6    Period  Weeks    Status  On-going      OT LONG TERM GOAL #4   Title  Patient will report a decrease in pain in the shoulder and/or neck of approximately 3/10 or less during functional daily tasks.    Time  6    Period  Weeks    Status  On-going            Plan - 08/06/19 1024    Clinical Impression Statement  A: Pt reports it is easier to use her arm now, mild soreness from previous session. Continued with manual therapy this session, pt with min/mod  restrictions at anterior shoulder. Pt with ROM WNL today, continued with A/ROM and added green therapy ball strengthening and red scapular theraband. Verbal cuing for form and technique.    Body Structure / Function / Physical Skills  ADL;ROM;Fascial restriction;Strength;Decreased knowledge of use of DME;Pain;UE functional use    Plan  P: Follow up on HEP, continue with green therapy ball exercises, add UBE       Patient will benefit from skilled therapeutic intervention in order to improve the following deficits and impairments:   Body Structure / Function / Physical Skills: ADL, ROM, Fascial restriction, Strength, Decreased knowledge of use of DME, Pain, UE functional use       Visit Diagnosis: Stiffness of right shoulder, not elsewhere classified  Other symptoms and signs involving the musculoskeletal system  Acute pain of right shoulder    Problem List Patient Active Problem List   Diagnosis Date Noted  . History of colonic polyps 01/01/2019  . Liver cirrhosis secondary  to NASH ([place]) 01/22/2018  . Dysphagia 04/19/2017  . Abdominal pain, epigastric 04/19/2017  . Abdominal pain 12/08/2014  . Postmenopausal bleeding 11/18/2014  . Closed fracture of distal clavicle 04/17/2014  . Muscle weakness (generalized) 03/26/2014  . Pain in joint, shoulder region 03/26/2014  . Decreased range of motion of right shoulder 03/26/2014  . Right clavicle fracture 03/19/2014  . Fatty liver 03/17/2014  . Rectal bleeding 03/17/2014  . Dyspepsia 03/11/2014  . Hypothyroidism 09/11/2012  . ADHD (attention deficit hyperactivity disorder) 09/11/2012  . Anxiety 09/11/2012  . GERD 12/21/2009  . IRRITABLE BOWEL SYNDROME 04/22/2009  . HYPERLIPIDEMIA 07/09/2008  . DEPRESSION 07/09/2008  . HYPERTENSION 07/09/2008  . Gastritis and gastroduodenitis 07/09/2008  . SLEEP APNEA 07/09/2008   Guadelupe Sabin, OTR/L  [phone] 08/06/2019, 10:28 AM  [place] [address] [place], [place], [address] Phone: [phone]   Fax:  [phone]  Name: Yolanda [name] MRN: [id number] Date of Birth: September 01, 1955

## 2019-08-06 NOTE — Patient Instructions (Signed)

## 2019-08-08 ENCOUNTER — Ambulatory Visit (HOSPITAL_COMMUNITY)
Admission: RE | Admit: 2019-08-08 | Discharge: 2019-08-08 | Disposition: A | Discharge status: Home / Self Care | Payer: Medicare Other | Source: Ambulatory Visit | Attending: Family Medicine | Admitting: Family Medicine

## 2019-08-08 ENCOUNTER — Other Ambulatory Visit: Payer: Self-pay

## 2019-08-08 DIAGNOSIS — Z1231 Encounter for screening mammogram for malignant neoplasm of breast: Secondary | ICD-10-CM | POA: Insufficient documentation

## 2019-08-13 ENCOUNTER — Ambulatory Visit (HOSPITAL_COMMUNITY): Payer: Medicare Other | Attending: Physician Assistant | Admitting: Occupational Therapy

## 2019-08-13 ENCOUNTER — Other Ambulatory Visit: Payer: Self-pay

## 2019-08-13 ENCOUNTER — Encounter (HOSPITAL_COMMUNITY): Payer: Self-pay | Admitting: Occupational Therapy

## 2019-08-13 DIAGNOSIS — M25511 Pain in right shoulder: Secondary | ICD-10-CM | POA: Insufficient documentation

## 2019-08-13 DIAGNOSIS — R29898 Other symptoms and signs involving the musculoskeletal system: Secondary | ICD-10-CM | POA: Diagnosis present

## 2019-08-13 DIAGNOSIS — M25611 Stiffness of right shoulder, not elsewhere classified: Secondary | ICD-10-CM | POA: Diagnosis present

## 2019-08-13 NOTE — Therapy (Signed)
[place], [place], [address] Phone: [phone]   Fax:  [phone]  Occupational Therapy Treatment  Patient Details  Name: Yolanda [name] MRN: [id number] Date of Birth: 1956-04-24 Referring Provider (OT): Noemi Chapel [place]   Encounter Date: 08/13/2019  OT End of Session - 08/13/19 1023    Visit Number  6    Number of Visits  12    Date for OT Re-Evaluation  08/25/19    Authorization Type  1) Medicare 2) [employer] senior supplement -No copay. No authorization. 10 th visit progress note due on visit 10    Authorization - Visit Number  6    Authorization - Number of Visits  10    OT Start Time  0945    OT Stop Time  1025    OT Time Calculation (min)  40 min    Activity Tolerance  Patient tolerated treatment well    Behavior During Therapy  WFL for tasks assessed/performed       Past Medical History:  Diagnosis Date  . Abnormal uterine bleeding   . Anxiety   . Arthritis   . Calculus of kidney 07/09/2008   Qualifier: Diagnosis of  By: Kellie Simmering LPN, Almyra Free    . Chronic abdominal pain   . Chronic pain in left foot   . Clotting disorder ([place])   . Depression   . Elevated liver function tests 2018  . Endometriosis   . Fatty liver   . Fibromyalgia   . Gastritis   . GERD 12/21/2009   Qualifier: Diagnosis of  By: Craige Cotta    . Hypertension    stopped meds in Aug 2015  . Hypothyroidism   . IBS (irritable bowel syndrome)   . Internal hemorrhoids   . Kidney stone   . Obesity, morbid ([place]) 09/11/2012  . PE 07/09/2008   Qualifier: Diagnosis of  By: Kellie Simmering LPN, Almyra Free    . PE (pulmonary embolism)   . PONV (postoperative nausea and vomiting)   . Sleep apnea    hasn't worn a CPAP in 6 months  . Thyroid disease   . Vitamin D deficiency     Past Surgical History:  Procedure Laterality Date  . ABDOMINAL SURGERY     laparoscopy  . BIOPSY N/A 03/24/2014   Procedure: GASTRIC BIOPSIES;  Surgeon: Danie Binder,  MD;  Location: [facility];  Service: Endoscopy;  Laterality: N/A;  . BREAST LUMPECTOMY    . CESAREAN SECTION     X2  . COLONOSCOPY  2006   internal hemorrhoids  . COLONOSCOPY WITH PROPOFOL N/A 03/24/2014   Dr. [name]: 2 tubular adenomas removed, hemorrhoids  . CYSTOSCOPY W/ RETROGRADES  01/23/2012   Procedure: CYSTOSCOPY WITH RETROGRADE PYELOGRAM;  Surgeon: Marissa Nestle, MD;  Location: [facility];  Service: Urology;  Laterality: Left;  . DILATATION & CURETTAGE/HYSTEROSCOPY WITH MYOSURE N/A 11/18/2014   Procedure: DILATATION & CURETTAGE/HYSTEROSCOPY WITH MYOSURE, resection of polyp;  Surgeon: Cheri Fowler, MD;  Location: [facility];  Service: Gynecology;  Laterality: N/A;  . DILATION AND CURETTAGE OF UTERUS    . ESOPHAGOGASTRODUODENOSCOPY   08/24/2006   Dr. Veto Kemps erythema of the antrum without erosion or ulcers/Otherwise, normal esophagus without evidence of Barrett's path with chronic gastritis  . ESOPHAGOGASTRODUODENOSCOPY (EGD) WITH PROPOFOL N/A 03/24/2014   Dr. [name]: gastritis  . POLYPECTOMY N/A 03/24/2014   Procedure: POLYPECTOMY;  Surgeon: Danie Binder, MD;  Location: [facility];  Service: Endoscopy;  Laterality: N/A;  .  REMOVAL OF STONES  01/23/2012   Procedure: REMOVAL OF STONES;  Surgeon: Marissa Nestle, MD;  Location: [facility];  Service: Urology;  Laterality: N/A;  . TUBAL LIGATION      There were no vitals filed for this visit.  Subjective Assessment - 08/13/19 0945    Subjective   S: My shoulder feels pretty good but my neck is bothering me.    Currently in Pain?  Yes    Pain Score  2     Pain Location  Neck    Pain Orientation  Right    Pain Descriptors / Indicators  Sore;Aching    Pain Type  Acute pain    Pain Radiating Towards  N/A    Pain Onset  More than a month ago    Pain Frequency  Constant    Aggravating Factors   unsure    Pain Relieving Factors  heating pad    Effect of Pain on Daily Activities  min effect on ADLs    Multiple Pain Sites  No          [facility] OT Assessment - 08/13/19 0945      Assessment   Medical Diagnosis  Right shoulder RTC tendonsis and cervicalgia      Precautions   Precautions  None               OT Treatments/Exercises (OP) - 08/13/19 0948      Exercises   Exercises  Shoulder      Neck Exercises: Stretches   Upper Trapezius Stretch  3 reps;10 seconds    Levator Stretch  3 reps;10 seconds    Lower Cervical/Upper Thoracic Stretch  3 reps;10 seconds      Shoulder Exercises: Supine   Protraction  PROM;5 reps;Strengthening;12 reps    Horizontal ABduction  PROM;5 reps;Strengthening;12 reps    External Rotation  PROM;5 reps;Strengthening;12 reps    Internal Rotation  PROM;5 reps;Strengthening;12 reps    Flexion  PROM;5 reps;Strengthening;12 reps    ABduction  PROM;5 reps;Strengthening;12 reps      Shoulder Exercises: Standing   Protraction  AROM;12 reps    Horizontal ABduction  AROM;12 reps    External Rotation  AROM;12 reps    Internal Rotation  AROM;12 reps    Flexion  AROM;12 reps    ABduction  AROM;12 reps    Extension  Theraband;10 reps    Theraband Level (Shoulder Extension)  Level 2 (Red)    Row  Theraband;10 reps    Theraband Level (Shoulder Row)  Level 2 (Red)    Retraction  Theraband;10 reps    Theraband Level (Shoulder Retraction)  Level 2 (Red)      Shoulder Exercises: ROM/Strengthening   UBE (Upper Arm Bike)  Level 1 2' forward 2' reverse   pace: 4.5   X to V Arms  12X    Proximal Shoulder Strengthening, Supine  10X each, 1#, no rest breaks      Manual Therapy   Manual Therapy  Myofascial release    Manual therapy comments  manual therapy completed seperatley from all other interventions    Myofascial Release  myofascial release and manual stretching to right upper arm, shoulder, scapular region, cervical region and associated areas to decrese pain and restrictions and improve pain free mobility             OT Education - 08/13/19 1026    Education Details   educated pt on use of tennis ball for self-massage technique  Person(s) Educated  Patient    Methods  Explanation;Demonstration    Comprehension  Verbalized understanding;Returned demonstration       OT Short Term Goals - 08/04/19 1430      OT SHORT TERM GOAL #1   Title  Patient will be educated and independent with HEP in order to increase functional performance during ADL tasks.    Time  3    Period  Weeks    Status  On-going    Target Date  08/05/19      OT SHORT TERM GOAL #2   Title  Patient will increase her RUE P/ROM to [facility] in order to increase ability to complete reaching tasks above the shoulder level.    Time  3    Period  Weeks    Status  On-going        OT Long Term Goals - 08/04/19 1430      OT LONG TERM GOAL #1   Title  Patient will return to highest level of independence while utilizing her RUE and neck for all required ADL tasks 75% or more of the time.    Time  6    Period  Weeks    Status  On-going      OT LONG TERM GOAL #2   Title  Patient will increase RUE and cervical A/ROM to [facility] in order to increase ability to complete functional reaching tasks as well as increase ability to look right and left when driving.    Time  6    Period  Weeks    Status  On-going      OT LONG TERM GOAL #3   Title  Patient will decrease fascial restrictions to min amount or less in order to increase functional mobility needed to complete reaching tasks above shoulder level.    Time  6    Period  Weeks    Status  On-going      OT LONG TERM GOAL #4   Title  Patient will report a decrease in pain in the shoulder and/or neck of approximately 3/10 or less during functional daily tasks.    Time  6    Period  Weeks    Status  On-going            Plan - 08/13/19 1012    Clinical Impression Statement  A: Pt reports pain in her neck down to her shoulder blade. Myofascial release completed to cervical region and medial border of scapula, pt with tenderness initially  however improved with manual techniques. Progressed to strengthening in supine using 1# weights, completed cervical neck stretches this session as well. Continued with A/ROM in standing, scapular theraband, and UBE. Verbal cuing for form and technique.    Body Structure / Function / Physical Skills  ADL;ROM;Fascial restriction;Strength;Decreased knowledge of use of DME;Pain;UE functional use    Plan  P: Follow up on use of tennis ball for self-massage, resume green therapy ball exercises       Patient will benefit from skilled therapeutic intervention in order to improve the following deficits and impairments:   Body Structure / Function / Physical Skills: ADL, ROM, Fascial restriction, Strength, Decreased knowledge of use of DME, Pain, UE functional use       Visit Diagnosis: Stiffness of right shoulder, not elsewhere classified  Other symptoms and signs involving the musculoskeletal system  Acute pain of right shoulder    Problem List Patient Active Problem List   Diagnosis Date Noted  .  History of colonic polyps 01/01/2019  . Liver cirrhosis secondary to NASH ([place]) 01/22/2018  . Dysphagia 04/19/2017  . Abdominal pain, epigastric 04/19/2017  . Abdominal pain 12/08/2014  . Postmenopausal bleeding 11/18/2014  . Closed fracture of distal clavicle 04/17/2014  . Muscle weakness (generalized) 03/26/2014  . Pain in joint, shoulder region 03/26/2014  . Decreased range of motion of right shoulder 03/26/2014  . Right clavicle fracture 03/19/2014  . Fatty liver 03/17/2014  . Rectal bleeding 03/17/2014  . Dyspepsia 03/11/2014  . Hypothyroidism 09/11/2012  . ADHD (attention deficit hyperactivity disorder) 09/11/2012  . Anxiety 09/11/2012  . GERD 12/21/2009  . IRRITABLE BOWEL SYNDROME 04/22/2009  . HYPERLIPIDEMIA 07/09/2008  . DEPRESSION 07/09/2008  . HYPERTENSION 07/09/2008  . Gastritis and gastroduodenitis 07/09/2008  . SLEEP APNEA 07/09/2008   Guadelupe Sabin, OTR/L   [phone] 08/13/2019, 10:26 AM  [facility] [address] [place], [place], [address] Phone: [phone]   Fax:  [phone]  Name: Yolanda [name] MRN: [id number] Date of Birth: 1956/05/01

## 2019-08-18 ENCOUNTER — Encounter (HOSPITAL_COMMUNITY): Payer: Self-pay

## 2019-08-18 ENCOUNTER — Ambulatory Visit (HOSPITAL_COMMUNITY): Payer: Medicare Other

## 2019-08-18 ENCOUNTER — Other Ambulatory Visit: Payer: Self-pay

## 2019-08-18 DIAGNOSIS — M25511 Pain in right shoulder: Secondary | ICD-10-CM

## 2019-08-18 DIAGNOSIS — M25611 Stiffness of right shoulder, not elsewhere classified: Secondary | ICD-10-CM | POA: Diagnosis not present

## 2019-08-18 DIAGNOSIS — R29898 Other symptoms and signs involving the musculoskeletal system: Secondary | ICD-10-CM

## 2019-08-18 NOTE — Therapy (Signed)
[place], [place], [address] Phone: [phone]   Fax:  [phone]  Occupational Therapy Treatment  Patient Details  Name: CATHLENE GARDELLA MRN: [id number] Date of Birth: 1956-03-29 Referring Provider (OT): Noemi Chapel [place]   Encounter Date: 08/18/2019  OT End of Session - 08/18/19 1534    Visit Number  7    Number of Visits  12    Date for OT Re-Evaluation  08/25/19    Authorization Type  1) Medicare 2) [employer] senior supplement -No copay. No authorization. 10 th visit progress note due on visit 10    Authorization - Visit Number  7    Authorization - Number of Visits  10    OT Start Time  1324    OT Stop Time  1550    OT Time Calculation (min)  33 min    Activity Tolerance  Patient tolerated treatment well    Behavior During Therapy  WFL for tasks assessed/performed       Past Medical History:  Diagnosis Date  . Abnormal uterine bleeding   . Anxiety   . Arthritis   . Calculus of kidney 07/09/2008   Qualifier: Diagnosis of  By: Kellie Simmering LPN, Almyra Free    . Chronic abdominal pain   . Chronic pain in left foot   . Clotting disorder ([place])   . Depression   . Elevated liver function tests 2018  . Endometriosis   . Fatty liver   . Fibromyalgia   . Gastritis   . GERD 12/21/2009   Qualifier: Diagnosis of  By: Craige Cotta    . Hypertension    stopped meds in Aug 2015  . Hypothyroidism   . IBS (irritable bowel syndrome)   . Internal hemorrhoids   . Kidney stone   . Obesity, morbid ([place]) 09/11/2012  . PE 07/09/2008   Qualifier: Diagnosis of  By: Kellie Simmering LPN, Almyra Free    . PE (pulmonary embolism)   . PONV (postoperative nausea and vomiting)   . Sleep apnea    hasn't worn a CPAP in 6 months  . Thyroid disease   . Vitamin D deficiency     Past Surgical History:  Procedure Laterality Date  . ABDOMINAL SURGERY     laparoscopy  . BIOPSY N/A 03/24/2014   Procedure: GASTRIC BIOPSIES;  Surgeon: Danie Binder,  MD;  Location: [facility];  Service: Endoscopy;  Laterality: N/A;  . BREAST LUMPECTOMY    . CESAREAN SECTION     X2  . COLONOSCOPY  2006   internal hemorrhoids  . COLONOSCOPY WITH PROPOFOL N/A 03/24/2014   Dr. [name]: 2 tubular adenomas removed, hemorrhoids  . CYSTOSCOPY W/ RETROGRADES  01/23/2012   Procedure: CYSTOSCOPY WITH RETROGRADE PYELOGRAM;  Surgeon: Marissa Nestle, MD;  Location: [facility];  Service: Urology;  Laterality: Left;  . DILATATION & CURETTAGE/HYSTEROSCOPY WITH MYOSURE N/A 11/18/2014   Procedure: DILATATION & CURETTAGE/HYSTEROSCOPY WITH MYOSURE, resection of polyp;  Surgeon: Cheri Fowler, MD;  Location: [facility];  Service: Gynecology;  Laterality: N/A;  . DILATION AND CURETTAGE OF UTERUS    . ESOPHAGOGASTRODUODENOSCOPY   08/24/2006   Dr. Veto Kemps erythema of the antrum without erosion or ulcers/Otherwise, normal esophagus without evidence of Barrett's path with chronic gastritis  . ESOPHAGOGASTRODUODENOSCOPY (EGD) WITH PROPOFOL N/A 03/24/2014   Dr. [name]: gastritis  . POLYPECTOMY N/A 03/24/2014   Procedure: POLYPECTOMY;  Surgeon: Danie Binder, MD;  Location: [facility];  Service: Endoscopy;  Laterality: N/A;  .  REMOVAL OF STONES  01/23/2012   Procedure: REMOVAL OF STONES;  Surgeon: Marissa Nestle, MD;  Location: [facility];  Service: Urology;  Laterality: N/A;  . TUBAL LIGATION      There were no vitals filed for this visit.  Subjective Assessment - 08/18/19 1532    Subjective   S: Yesterday my shoulder was really bothering me in the back.    Currently in Pain?  No/denies         [facility] OT Assessment - 08/18/19 1533      Assessment   Medical Diagnosis  Right shoulder RTC tendonsis and cervicalgia      Precautions   Precautions  None               OT Treatments/Exercises (OP) - 08/18/19 1533      Exercises   Exercises  Shoulder      Shoulder Exercises: Supine   Protraction  PROM;5 reps;Strengthening;15 reps    Protraction Weight (lbs)  1     Horizontal ABduction  PROM;5 reps;Strengthening;12 reps    Horizontal ABduction Weight (lbs)  1    External Rotation  PROM;5 reps;Strengthening;15 reps    External Rotation Limitations  1    Internal Rotation  PROM;5 reps;Strengthening;15 reps    Internal Rotation Limitations  1    Flexion  PROM;5 reps;Strengthening;15 reps    Flexion Limitations  1    ABduction  PROM;5 reps;Strengthening;10 reps    ABduction Limitations  1      Shoulder Exercises: Standing   Protraction  Strengthening;10 reps    Protraction Weight (lbs)  1    Horizontal ABduction  Strengthening;10 reps    Horizontal ABduction Weight (lbs)  1    External Rotation  Strengthening;10 reps    External Rotation Weight (lbs)  1    Internal Rotation  Strengthening;10 reps    Internal Rotation Weight (lbs)  1    Flexion  Strengthening;10 reps    Shoulder Flexion Weight (lbs)  1    ABduction  Strengthening;10 reps    Shoulder ABduction Weight (lbs)  1      Shoulder Exercises: ROM/Strengthening   Over Head Lace  2' seated    X to V Arms  10X with 1#    Proximal Shoulder Strengthening, Supine  10X each, 1#, no rest breaks    Other ROM/Strengthening Exercises  Y arm lift off; 10X      Manual Therapy   Manual Therapy  Myofascial release    Manual therapy comments  manual therapy completed seperatley from all other interventions    Myofascial Release  myofascial release and manual stretching to right upper arm, shoulder, scapular region, cervical region and associated areas to decrese pain and restrictions and improve pain free mobility               OT Short Term Goals - 08/04/19 1430      OT SHORT TERM GOAL #1   Title  Patient will be educated and independent with HEP in order to increase functional performance during ADL tasks.    Time  3    Period  Weeks    Status  On-going    Target Date  08/05/19      OT SHORT TERM GOAL #2   Title  Patient will increase her RUE P/ROM to [facility] in order to increase ability  to complete reaching tasks above the shoulder level.    Time  3    Period  Weeks  Status  On-going        OT Long Term Goals - 08/04/19 1430      OT LONG TERM GOAL #1   Title  Patient will return to highest level of independence while utilizing her RUE and neck for all required ADL tasks 75% or more of the time.    Time  6    Period  Weeks    Status  On-going      OT LONG TERM GOAL #2   Title  Patient will increase RUE and cervical A/ROM to [facility] in order to increase ability to complete functional reaching tasks as well as increase ability to look right and left when driving.    Time  6    Period  Weeks    Status  On-going      OT LONG TERM GOAL #3   Title  Patient will decrease fascial restrictions to min amount or less in order to increase functional mobility needed to complete reaching tasks above shoulder level.    Time  6    Period  Weeks    Status  On-going      OT LONG TERM GOAL #4   Title  Patient will report a decrease in pain in the shoulder and/or neck of approximately 3/10 or less during functional daily tasks.    Time  6    Period  Weeks    Status  On-going            Plan - 08/18/19 1547    Clinical Impression Statement  A: Pt's lower back began to bother her towards the end of the session. Session was terminated early due to back pain. Completed strengthening with 1# weight supine and standing during session. VC for form and technique were provided. Manual techniques were completed to address fascial restrictions in the right UE.    Body Structure / Function / Physical Skills  ADL;ROM;Fascial restriction;Strength;Decreased knowledge of use of DME;Pain;UE functional use    Plan  P: Reassessment. FOTO. Determine if additional therapy is needed.    Consulted and Agree with Plan of Care  Patient       Patient will benefit from skilled therapeutic intervention in order to improve the following deficits and impairments:   Body Structure / Function / Physical  Skills: ADL, ROM, Fascial restriction, Strength, Decreased knowledge of use of DME, Pain, UE functional use       Visit Diagnosis: Stiffness of right shoulder, not elsewhere classified  Other symptoms and signs involving the musculoskeletal system  Acute pain of right shoulder    Problem List Patient Active Problem List   Diagnosis Date Noted  . History of colonic polyps 01/01/2019  . Liver cirrhosis secondary to NASH ([place]) 01/22/2018  . Dysphagia 04/19/2017  . Abdominal pain, epigastric 04/19/2017  . Abdominal pain 12/08/2014  . Postmenopausal bleeding 11/18/2014  . Closed fracture of distal clavicle 04/17/2014  . Muscle weakness (generalized) 03/26/2014  . Pain in joint, shoulder region 03/26/2014  . Decreased range of motion of right shoulder 03/26/2014  . Right clavicle fracture 03/19/2014  . Fatty liver 03/17/2014  . Rectal bleeding 03/17/2014  . Dyspepsia 03/11/2014  . Hypothyroidism 09/11/2012  . ADHD (attention deficit hyperactivity disorder) 09/11/2012  . Anxiety 09/11/2012  . GERD 12/21/2009  . IRRITABLE BOWEL SYNDROME 04/22/2009  . HYPERLIPIDEMIA 07/09/2008  . DEPRESSION 07/09/2008  . HYPERTENSION 07/09/2008  . Gastritis and gastroduodenitis 07/09/2008  . SLEEP APNEA 07/09/2008   Ailene Ravel, OTR/L,CBIS  [phone]  08/18/2019, 3:54 PM  [place], [place], [address] Phone: [phone]   Fax:  [phone]  Name: ALARIA OCONNOR MRN: [id number] Date of Birth: 1955/08/03

## 2019-08-18 NOTE — Progress Notes (Signed)
NEUROLOGY CONSULTATION NOTE  Yolanda [name] MRN: [id number] DOB: [birth year]  Referring provider: Sharilyn Sites, MD Primary care provider: Sharilyn Sites, MD  Reason for consult:  Speech disturbance  HISTORY OF PRESENT ILLNESS: Yolanda [name] is a 64 year old right-handed white female with hypertension, type 2 diabetes mellitus and hyperlipidemia who presents for speech disturbance.  History supplemented by referring provider note.  She is accompanied by her daughter via phone.  For the past 6 months she has been having episodes of speaking gibberish.  It occurs for a word or two.  She often needs to stop and think about what she wants to say.  It occurs daily, sometimes once or several times in a day.  She denies difficulty understanding other people.  She has no difficulty reading. Overall, writing is okay but reports that one time, she had trouble remembering her names.  She kept remembering her previous married name and then proceeded to write down her daughter's name. Over the last several years, she has made some paraphasic errors, for example saying freezer instead of microwave.  She has had short-term memory problems for several years.  She often will forget conversations.  She sometimes forgets to take medication despite use of a pillbox.  Sometimes, she stops conversation mid-sentence because she forgot what she wanted to say.  If she is interrupted while talking, she forgets what she was talking about.  She has been paying her bills without difficulty.  Overall, no difficulty driving.  On occasion, she may briefly get disoriented driving on familiar routes but she is quickly able to re-orient herself.  She does use a GPS. No accidents or near-accidents.  She also reports longstanding history of dizziness, described as lightheadedness but not spinning.  It occurs when she is on her feet, either standing or walking.  She needs to use a cane for fear that she may pass out and fall.  She  feels fine if she is sitting or reclining.  She is concerned that she has been having mini-strokes.  In October, she increased her ASA from 76m to 3254mdaily.    06/17/2019 LABS:  B12 575; TSH 1.150; HGB A1c 5.7; LDL 173; BUN 17, Cr 0.65, t bili 0.6, ALP 137, AST 47, ALT 40  She was started on Crestor.  She takes gabapentin 10063mnd Flexeril for neck pain.  She takes Cymbalta 69m18mr fibromyalgia.  She denies family history of dementia.  Her mother had a stroke. Highest level of education:  Some college.    PAST MEDICAL HISTORY: Past Medical History:  Diagnosis Date  . Abnormal uterine bleeding   . Anxiety   . Arthritis   . Calculus of kidney 07/09/2008   Qualifier: Diagnosis of  By: LawsKellie Simmering, JuliAlmyra Free. Chronic abdominal pain   . Chronic pain in left foot   . Clotting disorder (HCC)[place]. Depression   . Elevated liver function tests 2018  . Endometriosis   . Fatty liver   . Fibromyalgia   . Gastritis   . GERD 12/21/2009   Qualifier: Diagnosis of  By: MoorCraige Cotta. Hypertension    stopped meds in Aug 2015  . Hypothyroidism   . IBS (irritable bowel syndrome)   . Internal hemorrhoids   . Kidney stone   . Obesity, morbid (HCC)[place]5/2014  . PE 07/09/2008   Qualifier: Diagnosis of  By: LawsKellie Simmering, JuliAlmyra Free. PE (pulmonary embolism)   .  PONV (postoperative nausea and vomiting)   . Sleep apnea    hasn't worn a CPAP in 6 months  . Thyroid disease   . Vitamin D deficiency     PAST SURGICAL HISTORY: Past Surgical History:  Procedure Laterality Date  . ABDOMINAL SURGERY     laparoscopy  . BIOPSY N/A 03/24/2014   Procedure: GASTRIC BIOPSIES;  Surgeon: Danie Binder, MD;  Location: [facility];  Service: Endoscopy;  Laterality: N/A;  . BREAST LUMPECTOMY    . CESAREAN SECTION     X2  . COLONOSCOPY  2006   internal hemorrhoids  . COLONOSCOPY WITH PROPOFOL N/A 03/24/2014   Dr. [name]: 2 tubular adenomas removed, hemorrhoids  . CYSTOSCOPY W/ RETROGRADES  01/23/2012    Procedure: CYSTOSCOPY WITH RETROGRADE PYELOGRAM;  Surgeon: Marissa Nestle, MD;  Location: [facility];  Service: Urology;  Laterality: Left;  . DILATATION & CURETTAGE/HYSTEROSCOPY WITH MYOSURE N/A 11/18/2014   Procedure: DILATATION & CURETTAGE/HYSTEROSCOPY WITH MYOSURE, resection of polyp;  Surgeon: Cheri Fowler, MD;  Location: [facility];  Service: Gynecology;  Laterality: N/A;  . DILATION AND CURETTAGE OF UTERUS    . ESOPHAGOGASTRODUODENOSCOPY   08/24/2006   Dr. Veto Kemps erythema of the antrum without erosion or ulcers/Otherwise, normal esophagus without evidence of Barrett's path with chronic gastritis  . ESOPHAGOGASTRODUODENOSCOPY (EGD) WITH PROPOFOL N/A 03/24/2014   Dr. [name]: gastritis  . POLYPECTOMY N/A 03/24/2014   Procedure: POLYPECTOMY;  Surgeon: Danie Binder, MD;  Location: [facility];  Service: Endoscopy;  Laterality: N/A;  . REMOVAL OF STONES  01/23/2012   Procedure: REMOVAL OF STONES;  Surgeon: Marissa Nestle, MD;  Location: [facility];  Service: Urology;  Laterality: N/A;  . TUBAL LIGATION      MEDICATIONS: Current Outpatient Medications on File Prior to Visit  Medication Sig Dispense Refill  . aspirin 325 MG tablet Take 325 mg by mouth daily.    . Cholecalciferol (VITAMIN D3) 2000 units TABS Take 1 tablet by mouth daily.    Marland Kitchen CRANBERRY-VITAMIN C PO Take by mouth daily.    . cyclobenzaprine (FLEXERIL) 10 MG tablet Take 10 mg by mouth daily.    . DULoxetine (CYMBALTA) 30 MG capsule Take 1 capsule by mouth daily.    Marland Kitchen gabapentin (NEURONTIN) 100 MG capsule Take 100 mg by mouth daily.    . Levothyroxine Sodium 100 MCG CAPS Take 100 mcg by mouth daily before breakfast.     . meloxicam (MOBIC) 7.5 MG tablet Take 7.5 mg by mouth as needed for pain.    Marland Kitchen olmesartan-hydrochlorothiazide (BENICAR HCT) 40-25 MG tablet Take 1 tablet by mouth daily.    . Omega-3 Fatty Acids (FISH OIL) 1000 MG CAPS Take 1 capsule by mouth daily.    Marland Kitchen omeprazole (PRILOSEC) 20 MG capsule 1 po 30 mins prior to  [place]. 90 capsule 3  . ondansetron (ZOFRAN) 4 MG tablet Take 4 mg by mouth every 8 (eight) hours as needed for nausea or vomiting.    Glory Rosebush DELICA LANCETS 66Q MISC     . ONETOUCH VERIO test strip     . polyethylene glycol-electrolytes (TRILYTE) 420 g solution Take 4,000 mLs by mouth as directed. 4000 mL 0  . RESTASIS MULTIDOSE 0.05 % ophthalmic emulsion Apply 1 drop to eye as needed.     . rosuvastatin (CRESTOR) 10 MG tablet Take 10 mg by mouth daily.    . Turmeric 500 MG TABS Take 1 tablet by mouth daily.     No current facility-administered  medications on file prior to visit.    ALLERGIES: Allergies  Allergen Reactions  . Azithromycin Hives  . Lisinopril     cough  . Sulfonamide Derivatives Other (See Comments)    Vaginal Infection     FAMILY HISTORY: Family History  Problem Relation Age of Onset  . Heart attack Mother        CABG  . Stroke Mother   . Hypertension Mother   . Cancer Mother   . Diabetes Mother   . Heart attack Father        CABG  . Hypertension Father   . Mesothelioma Father   . Heart attack Brother   . Diabetes Brother   . Depression Brother   . Hyperlipidemia Brother   . Diabetes Maternal Grandmother   . Diabetes Maternal Grandfather   . Cancer Paternal Grandmother   . Diabetes Brother   . Hyperlipidemia Brother   . Wilson's disease Other        2 nieces, one died in her 90s  . Colon cancer Neg Hx     SOCIAL HISTORY: Social History   Socioeconomic History  . Marital status: Divorced    Spouse name: Not on file  . Number of children: Not on file  . Years of education: Not on file  . Highest education level: Not on file  Occupational History  . Occupation: Social research officer, government: [employer]  Tobacco Use  . Smoking status: Former Smoker    Types: Cigarettes  . Smokeless tobacco: Never Used  Substance and Sexual Activity  . Alcohol use: Not Currently    Comment: "no alcohol in years"  . Drug  use: No  . Sexual activity: Never    Birth control/protection: Post-menopausal  Other Topics Concern  . Not on file  Social History Narrative  . Not on file   Social Determinants of Health   Financial Resource Strain:   . Difficulty of Paying Living Expenses: Not on file  Food Insecurity:   . Worried About Charity fundraiser in the Last Year: Not on file  . Ran Out of Food in the Last Year: Not on file  Transportation Needs:   . Lack of Transportation (Medical): Not on file  . Lack of Transportation (Non-Medical): Not on file  Physical Activity:   . Days of Exercise per Week: Not on file  . Minutes of Exercise per Session: Not on file  Stress:   . Feeling of Stress : Not on file  Social Connections:   . Frequency of Communication with Friends and Family: Not on file  . Frequency of Social Gatherings with Friends and Family: Not on file  . Attends Religious Services: Not on file  . Active Member of Clubs or Organizations: Not on file  . Attends Archivist Meetings: Not on file  . Marital Status: Not on file  Intimate Partner Violence:   . Fear of Current or Ex-Partner: Not on file  . Emotionally Abused: Not on file  . Physically Abused: Not on file  . Sexually Abused: Not on file    PHYSICAL EXAM: Blood pressure (!) 166/83, pulse 98, resp. rate 18, height 5' 4"  (1.626 m), weight 279 lb (126.6 kg), last menstrual period 07/11/2007, SpO2 96 %. General: No acute distress.  Patient appears well-groomed.   Head:  Normocephalic/atraumatic Eyes:  fundi examined but not visualized Neck: supple, no paraspinal tenderness, full range of motion Back: No paraspinal tenderness Heart: regular  rate and rhythm Lungs: Clear to auscultation bilaterally. Vascular: No carotid bruits. Neurological Exam: Mental status:  [employer] Mental Exam 08/19/2019  Weekday Correct 1  Current year 1  What state are we in? 1  Amount spent 0  Amount left 0  # of Animals 3  5  objects recall 4  Number series 1  Hour markers 0  Time correct 2  Placed X in triangle correctly 1  Largest Figure 1  Name of female 2  Date back to work 2  Type of work 2  State she lived in 2  Total score 23   CN I: not tested CN II: pupils equal, round and reactive to light, visual fields intact CN III, IV, VI:  full range of motion, no nystagmus, no ptosis CN V: facial sensation intact CN VII: upper and lower face symmetric CN VIII: hearing intact CN IX, X: gag intact, uvula midline CN XI: sternocleidomastoid and trapezius muscles intact CN XII: tongue midline Bulk & Tone: normal, no fasciculations. Motor:  5/5 throughout  Sensation:  temperature and vibration sensation intact. Deep Tendon Reflexes:  2+ throughout, toes downgoing.  Finger to nose testing:  Without dysmetria.   Heel to shin:  Without dysmetria.   Gait:  Normal station and stride.  Able to turn and tandem walk. Romberg negative.  IMPRESSION: 1.  Aphasia with memory deficits.  On SLUMS, verbal fluency and language looks okay.  Score is suggestive of mild neurocognitive disorder.  We need to evaluate for structural intracranial abnormality (such as stroke or mass lesion) or neurodegenerative disorder. 2.  Lightheadedness, chronic.  Symptoms not specific for neurologic etiology. 3.  Hypertension  PLAN: 1.  MRI of brain with and without contrast 2.  Neuropsychological testing 3.  Advised that her daughter monitor her medications 4.  Advised to use GPS when driving.  5.  Follow up with PCP regarding blood pressure.  Thank you for allowing me to take part in the care of this patient.  Metta Clines, DO  CC:  Sharilyn Sites, MD

## 2019-08-19 ENCOUNTER — Encounter: Payer: Self-pay | Admitting: Neurology

## 2019-08-19 ENCOUNTER — Ambulatory Visit (INDEPENDENT_AMBULATORY_CARE_PROVIDER_SITE_OTHER): Payer: Medicare Other | Admitting: Neurology

## 2019-08-19 ENCOUNTER — Other Ambulatory Visit: Payer: Self-pay

## 2019-08-19 VITALS — BP 166/83 | HR 98 | Resp 18 | Ht 64.0 in | Wt 279.0 lb

## 2019-08-19 DIAGNOSIS — I10 Essential (primary) hypertension: Secondary | ICD-10-CM

## 2019-08-19 DIAGNOSIS — R4701 Aphasia: Secondary | ICD-10-CM | POA: Diagnosis not present

## 2019-08-19 DIAGNOSIS — R42 Dizziness and giddiness: Secondary | ICD-10-CM

## 2019-08-19 DIAGNOSIS — R413 Other amnesia: Secondary | ICD-10-CM | POA: Diagnosis not present

## 2019-08-19 NOTE — Patient Instructions (Addendum)
1.  We will check MRI of brain with and without contrast 2.  I will refer you for neuropsychological testing (it is performed here in my office) 3.  I would recommend that your daughter monitor your medications to make sure that you do not miss doses. 4.  Use GPS when driving. 5.  Follow up after testing.  We have sent a referral to [facility] for your MRI and they will call you directly to schedule your appointment. [phone].

## 2019-08-20 ENCOUNTER — Ambulatory Visit (HOSPITAL_COMMUNITY): Payer: Medicare Other

## 2019-08-25 ENCOUNTER — Ambulatory Visit (HOSPITAL_COMMUNITY): Payer: Medicare Other

## 2019-08-25 ENCOUNTER — Telehealth (HOSPITAL_COMMUNITY): Payer: Self-pay

## 2019-08-25 NOTE — Telephone Encounter (Signed)
pt daughter is still sick and tey are not sure what she has, she is cancelling for today

## 2019-08-26 ENCOUNTER — Telehealth: Payer: Self-pay | Admitting: Neurology

## 2019-08-26 NOTE — Telephone Encounter (Signed)
Pt called

## 2019-08-26 NOTE — Telephone Encounter (Signed)
Patient is calling in about not hearing from the MRI orders with Michaele Offer was wanting to see what was going on. Thanks!

## 2019-09-01 ENCOUNTER — Encounter: Payer: Self-pay | Admitting: Gastroenterology

## 2019-09-01 ENCOUNTER — Other Ambulatory Visit: Payer: Self-pay

## 2019-09-01 ENCOUNTER — Ambulatory Visit (HOSPITAL_COMMUNITY): Payer: Medicare Other

## 2019-09-01 DIAGNOSIS — M25511 Pain in right shoulder: Secondary | ICD-10-CM

## 2019-09-01 DIAGNOSIS — M25611 Stiffness of right shoulder, not elsewhere classified: Secondary | ICD-10-CM

## 2019-09-01 DIAGNOSIS — R29898 Other symptoms and signs involving the musculoskeletal system: Secondary | ICD-10-CM

## 2019-09-01 NOTE — Therapy (Signed)
[place], [place], [address] Phone: [phone]   Fax:  [phone]  Occupational Therapy Treatment Reassessment and discharge Patient Details  Name: Yolanda [name] MRN: [id number] Date of Birth: 03/19/1956 Referring Provider (OT): Noemi Chapel PA   Progress Note Reporting Period 07/14/2019 to 09/01/2019  See note below for Objective Data and Assessment of Progress/Goals.       Encounter Date: 09/01/2019  OT End of Session - 09/01/19 1734    Visit Number  8    Number of Visits  12    Authorization Type  1) Medicare 2) [employer] senior supplement -No copay. No authorization. 10 th visit progress note due on visit 10    Authorization - Visit Number  8    Authorization - Number of Visits  10    OT Start Time  1601   reassess and discharge   OT Stop Time  1710    OT Time Calculation (min)  23 min    Activity Tolerance  Patient tolerated treatment well    Behavior During Therapy  WFL for tasks assessed/performed       Past Medical History:  Diagnosis Date  . Abnormal uterine bleeding   . Anxiety   . Arthritis   . Calculus of kidney 07/09/2008   Qualifier: Diagnosis of  By: Kellie Simmering LPN, Almyra Free    . Chronic abdominal pain   . Chronic pain in left foot   . Clotting disorder ([place])   . Depression   . Elevated liver function tests 2018  . Endometriosis   . Fatty liver   . Fibromyalgia   . Gastritis   . GERD 12/21/2009   Qualifier: Diagnosis of  By: Craige Cotta    . Hypertension    stopped meds in Aug 2015  . Hypothyroidism   . IBS (irritable bowel syndrome)   . Internal hemorrhoids   . Kidney stone   . Obesity, morbid ([place]) 09/11/2012  . PE 07/09/2008   Qualifier: Diagnosis of  By: Kellie Simmering LPN, Almyra Free    . PE (pulmonary embolism)   . PONV (postoperative nausea and vomiting)   . Sleep apnea    hasn't worn a CPAP in 6 months  . Thyroid disease   . Vitamin D deficiency     Past Surgical History:   Procedure Laterality Date  . ABDOMINAL SURGERY     laparoscopy  . BIOPSY N/A 03/24/2014   Procedure: GASTRIC BIOPSIES;  Surgeon: Danie Binder, MD;  Location: [facility];  Service: Endoscopy;  Laterality: N/A;  . BREAST LUMPECTOMY    . CESAREAN SECTION     X2  . COLONOSCOPY  2006   internal hemorrhoids  . COLONOSCOPY WITH PROPOFOL N/A 03/24/2014   Dr. [name]: 2 tubular adenomas removed, hemorrhoids  . CYSTOSCOPY W/ RETROGRADES  01/23/2012   Procedure: CYSTOSCOPY WITH RETROGRADE PYELOGRAM;  Surgeon: Marissa Nestle, MD;  Location: [facility];  Service: Urology;  Laterality: Left;  . DILATATION & CURETTAGE/HYSTEROSCOPY WITH MYOSURE N/A 11/18/2014   Procedure: DILATATION & CURETTAGE/HYSTEROSCOPY WITH MYOSURE, resection of polyp;  Surgeon: Cheri Fowler, MD;  Location: [facility];  Service: Gynecology;  Laterality: N/A;  . DILATION AND CURETTAGE OF UTERUS    . ESOPHAGOGASTRODUODENOSCOPY   08/24/2006   Dr. Veto Kemps erythema of the antrum without erosion or ulcers/Otherwise, normal esophagus without evidence of Barrett's path with chronic gastritis  . ESOPHAGOGASTRODUODENOSCOPY (EGD) WITH PROPOFOL N/A 03/24/2014   Dr. [name]: gastritis  . POLYPECTOMY N/A 03/24/2014  Procedure: POLYPECTOMY;  Surgeon: Danie Binder, MD;  Location: [facility];  Service: Endoscopy;  Laterality: N/A;  . REMOVAL OF STONES  01/23/2012   Procedure: REMOVAL OF STONES;  Surgeon: Marissa Nestle, MD;  Location: [facility];  Service: Urology;  Laterality: N/A;  . TUBAL LIGATION      There were no vitals filed for this visit.  Subjective Assessment - 09/01/19 1703    Subjective   S: On the weekend, my shoulder was causing me to have tears. It was just so full of knots and painful. Maybe it was the weather.    Currently in Pain?  Yes    Pain Score  1     Pain Location  Neck   shoulder   Pain Orientation  Right    Pain Descriptors / Indicators  Aching;Sore    Pain Type  Acute pain    Pain Radiating Towards  N/A    Pain Onset   In the past 7 days    Pain Frequency  Constant    Aggravating Factors   possible weather    Pain Relieving Factors  heating pad    Effect of Pain on Daily Activities  min effect    Multiple Pain Sites  No         [facility] OT Assessment - 09/01/19 1650      Assessment   Medical Diagnosis  Right shoulder RTC tendonsis and cervicalgia      Precautions   Precautions  None      Observation/Other Assessments   Focus on Therapeutic Outcomes (FOTO)   69/100      ROM / Strength   AROM / PROM / Strength  AROM;PROM;Strength      AROM   Overall AROM Comments  Assessed seated. IR/er adducted    AROM Assessment Site  Shoulder;Cervical    Right/Left Shoulder  Right    Right Shoulder Flexion  165 Degrees   previous: 142   Right Shoulder ABduction  165 Degrees   previous: 125   Right Shoulder Internal Rotation  90 Degrees   previous: same   Right Shoulder External Rotation  70 Degrees   previous: same   Cervical - Right Side Bend  40   previous: 17   Cervical - Left Side Bend  35   previous: 24   Cervical - Right Rotation  55   previous: 35   Cervical - Left Rotation  71   previous: 40     PROM   Overall PROM Comments  Assessed supine. IR/ er abducted    PROM Assessment Site  Shoulder    Right/Left Shoulder  Right    Right Shoulder Flexion  180 Degrees   previous: 148   Right Shoulder ABduction  180 Degrees   previou: same   Right Shoulder Internal Rotation  90 Degrees   previous: same   Right Shoulder External Rotation  90 Degrees   previous; same              OT Treatments/Exercises (OP) - 09/01/19 1733      Exercises   Exercises  Shoulder      Shoulder Exercises: Supine   Protraction  PROM;5 reps    Horizontal ABduction  PROM;5 reps    External Rotation  PROM;5 reps    Internal Rotation  PROM;5 reps    Flexion  PROM;5 reps    ABduction  PROM;5 reps  OT Education - 09/01/19 1734    Education Details  reviewed goals and progress in  therapy. Reviewed HEP. Recommended that patient continue to complete neck ROM and scapular strengthening exercises upon discharge.    Person(s) Educated  Patient    Methods  Explanation    Comprehension  Verbalized understanding       OT Short Term Goals - 09/01/19 1701      OT SHORT TERM GOAL #1   Title  Patient will be educated and independent with HEP in order to increase functional performance during ADL tasks.    Time  3    Period  Weeks    Status  Achieved    Target Date  08/05/19      OT SHORT TERM GOAL #2   Title  Patient will increase her RUE P/ROM to [facility] in order to increase ability to complete reaching tasks above the shoulder level.    Time  3    Period  Weeks    Status  Achieved        OT Long Term Goals - 09/01/19 1701      OT LONG TERM GOAL #1   Title  Patient will return to highest level of independence while utilizing her RUE and neck for all required ADL tasks 75% or more of the time.    Time  6    Period  Weeks    Status  Achieved      OT LONG TERM GOAL #2   Title  Patient will increase RUE and cervical A/ROM to [facility] in order to increase ability to complete functional reaching tasks as well as increase ability to look right and left when driving.    Time  6    Period  Weeks    Status  Achieved      OT LONG TERM GOAL #3   Title  Patient will decrease fascial restrictions to min amount or less in order to increase functional mobility needed to complete reaching tasks above shoulder level.    Time  6    Period  Weeks    Status  Achieved      OT LONG TERM GOAL #4   Title  Patient will report a decrease in pain in the shoulder and/or neck of approximately 3/10 or less during functional daily tasks.    Time  6    Period  Weeks    Status  Achieved            Plan - 09/01/19 1735    Clinical Impression Statement  A: Reassessment completed this date. Patient has met all her therapy goals. She has shown improvement with A/ROM for right shoulder and  neck, decreased pain, and decreased fascial restrictions. She continues to have occassional pain in the posterior portion of her shoulder near the infraspinatus muscle. Overall, patient has made great progress in therapy. Discussed HEP completion and made recommendations for frequency. All education was complete. patient agrees with discharge.    Body Structure / Function / Physical Skills  ADL;ROM;Fascial restriction;Strength;Decreased knowledge of use of DME;Pain;UE functional use    Plan  P: D/C from OT services with HEP.    Consulted and Agree with Plan of Care  Patient       Patient will benefit from skilled therapeutic intervention in order to improve the following deficits and impairments:   Body Structure / Function / Physical Skills: ADL, ROM, Fascial restriction, Strength, Decreased knowledge of use of DME, Pain, UE  functional use       Visit Diagnosis: Other symptoms and signs involving the musculoskeletal system  Acute pain of right shoulder  Stiffness of right shoulder, not elsewhere classified    Problem List Patient Active Problem List   Diagnosis Date Noted  . History of colonic polyps 01/01/2019  . Liver cirrhosis secondary to NASH ([place]) 01/22/2018  . Dysphagia 04/19/2017  . Abdominal pain, epigastric 04/19/2017  . Abdominal pain 12/08/2014  . Postmenopausal bleeding 11/18/2014  . Closed fracture of distal clavicle 04/17/2014  . Muscle weakness (generalized) 03/26/2014  . Pain in joint, shoulder region 03/26/2014  . Decreased range of motion of right shoulder 03/26/2014  . Right clavicle fracture 03/19/2014  . Fatty liver 03/17/2014  . Rectal bleeding 03/17/2014  . Dyspepsia 03/11/2014  . Hypothyroidism 09/11/2012  . ADHD (attention deficit hyperactivity disorder) 09/11/2012  . Anxiety 09/11/2012  . GERD 12/21/2009  . IRRITABLE BOWEL SYNDROME 04/22/2009  . HYPERLIPIDEMIA 07/09/2008  . DEPRESSION 07/09/2008  . HYPERTENSION 07/09/2008  . Gastritis and  gastroduodenitis 07/09/2008  . SLEEP APNEA 07/09/2008     OCCUPATIONAL THERAPY DISCHARGE SUMMARY  Visits from Start of Care: 8  Current functional level related to goals / functional outcomes: See above   Remaining deficits: See above   Education / Equipment: See above Plan: Patient agrees to discharge.  Patient goals were met. Patient is being discharged due to meeting the stated rehab goals.  ?????         Ailene Ravel, OTR/L,CBIS  [phone]  09/01/2019, 5:38 PM  [place] [address] [place], [place], [address] Phone: [phone]   Fax:  [phone]  Name: Yolanda [name] MRN: [id number] Date of Birth: [birth year]

## 2019-09-05 ENCOUNTER — Other Ambulatory Visit: Payer: Self-pay

## 2019-09-05 ENCOUNTER — Encounter: Payer: Self-pay | Admitting: Counselor

## 2019-09-05 ENCOUNTER — Ambulatory Visit: Payer: Medicare Other

## 2019-09-05 ENCOUNTER — Ambulatory Visit (INDEPENDENT_AMBULATORY_CARE_PROVIDER_SITE_OTHER): Payer: Medicare Other | Admitting: Counselor

## 2019-09-05 DIAGNOSIS — F09 Unspecified mental disorder due to known physiological condition: Secondary | ICD-10-CM | POA: Diagnosis not present

## 2019-09-05 DIAGNOSIS — R479 Unspecified speech disturbances: Secondary | ICD-10-CM

## 2019-09-05 NOTE — Progress Notes (Addendum)
   Psychometrist Note   Cognitive testing was administered to Yolanda [name] by Lamar Benes, B.S. (Technician) (psychometrist) under the supervision of Alphonzo Severance, Psy.D., ABN. Yolanda [name] was able to tolerate all test procedures. Dr. Nicole Kindred met with the patient as needed to manage any emotional reactions to the testing procedures (if applicable). Rest breaks were offered.    The battery of tests administered was selected by Dr. Nicole Kindred with consideration to the patient's current level of functioning, the nature of her symptoms, emotional and behavioral responses during the interview, level of literacy, observed level of motivation/effort, and the nature of the referral question. This battery was communicated to the psychometrist. Communication between Dr. Nicole Kindred and the psychometrist was ongoing throughout the evaluation and Dr. Nicole Kindred was immediately accessible at all times. Dr. Nicole Kindred provided supervision to the technician on the date of this service, to the extent necessary to assure the quality of all services provided.    Yolanda [name] will return in approximately one week for an interactive feedback session with Dr. Nicole Kindred, at which time female test performance, clinical impressions, and treatment recommendations will be reviewed in detail. The patient understands she can contact our office should she require our assistance before this time.   A total of 120 minutes of billable time were spent with Yolanda [name] by the technician, including test administration and scoring time. Billing for these services is reflected in Dr. Les Pou note.   This note reflects time spent with the psychometrician and does not include test scores, clinical history, or any interpretations made by Dr. Nicole Kindred. The full report will follow in a separate not
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
## 2019-09-09 ENCOUNTER — Ambulatory Visit: Payer: Medicare Other

## 2019-09-10 ENCOUNTER — Ambulatory Visit: Payer: Medicare Other | Attending: Internal Medicine

## 2019-09-10 ENCOUNTER — Other Ambulatory Visit: Payer: Self-pay

## 2019-09-10 ENCOUNTER — Ambulatory Visit (HOSPITAL_COMMUNITY)
Admission: RE | Admit: 2019-09-10 | Discharge: 2019-09-10 | Disposition: A | Discharge status: Home / Self Care | Payer: Medicare Other | Source: Ambulatory Visit | Attending: Neurology | Admitting: Neurology

## 2019-09-10 DIAGNOSIS — Z20822 Contact with and (suspected) exposure to covid-19: Secondary | ICD-10-CM

## 2019-09-10 DIAGNOSIS — R42 Dizziness and giddiness: Secondary | ICD-10-CM | POA: Diagnosis present

## 2019-09-10 DIAGNOSIS — R413 Other amnesia: Secondary | ICD-10-CM | POA: Insufficient documentation

## 2019-09-10 DIAGNOSIS — R4701 Aphasia: Secondary | ICD-10-CM | POA: Diagnosis present

## 2019-09-10 LAB — POCT I-STAT CREATININE: Creatinine, Ser: 0.7 mg/dL (ref 0.44–1.00)

## 2019-09-10 MED ORDER — GADOBUTROL 1 MMOL/ML IV SOLN
10.0000 mL | Freq: Once | INTRAVENOUS | Status: AC | PRN
  Administered 2019-09-10: 07:00:00 10 mL via INTRAVENOUS

## 2019-09-10 NOTE — Progress Notes (Signed)
[facility]  Patient Name: Yolanda [name] MRN: [id number] Date of Birth: 1955/11/11 Age: 64 y.o. Education: 13 years  Measurement properties of test scores: IQ, Index, and Standard Scores (SS): Mean = 100; Standard Deviation = 15 Scaled Scores (Ss): Mean = 10; Standard Deviation = 3 Z scores (Z): Mean = 0; Standard Deviation = 1 T scores (T); Mean = 50; Standard Deviation = 10  TEST SCORES:    Note: This summary of test scores accompanies the interpretive report and should not be considered in isolation without reference to the appropriate sections in the text. Test scores are relative to age, gender, and educational history as available and appropriate.   Performance Validity        ACS: Raw  Descriptor      Word Choice 41 Below Expectation      The Dot Counting Test: 7 Within Expectation      Embedded Measures:        WAIS-IV Reliable Digit Span 14 Within Expectation      Mental Status Screening     Total Score   MoCA 27       Expected Functioning        Wide Range Achievement Test: Standard/Scaled Score Percentile      Word Reading 104 61      Attention/Processing Speed        Wechsler Adult Intelligence Scale - IV: Standard/Scaled Score Percentile  Working Memory Index 122 93      Digit Span 15 95          Digit Span Forward 15 95          Digit Span Backward 14 91          Digit Span Sequencing 13 84      Arithmetic 13 84  Processing Speed Index 108 70      Symbol Search 12 75      Coding 11 63      Language        Neuropsychological Assessment Battery (Language Module, Form 1): T-score Percentile     Naming 57 75      Verbal Fluency:         Controlled Oral Word Association (F-A-S) 55 69     Semantic Fluency (Animals)   32 82      NACC UDS-3 Sentence Repetition T-Score Percentile      Sentence Repetition 30 2      Memory:        Neuropsychological Assessment Battery (Memory Module, Form 1): T-score  Percentile      List Learning           List A Immediate Recall   (6 , 8 , 11) 54 66         List B Immediate Recall   (2) 35 7         List A Short Delayed Recall   (10) 63 91         List A Long Delayed Recall   (9) 57 75         List A Long Delayed Yes/No Recognition Hits   (10) 45 31         List A Long Delayed Yes/No Recognition False Alarms   (1) 57 75         List A Recognition Discriminability Index 54 66     Story Learning           Immediate  Recall   (19, 26) 33 5         Delayed Recall   (23) 35 7      Daily Living Memory            Immediate Recall   (25, 15) 45 31          Delayed Recall   (8, 2) 35 7          Recognition Hits  36 8      Repeatable Battery for the Assessment of Neuropsychological Status (Form A): Scaled Score Percentile         Figure Recall   (15) 11 63      Visuospatial/Constructional Functioning            Repeatable Battery for the Assessment of Neuropsychological Status (Form A): Standard/Scaled Score Percentile      Visuospatial/Constructional Index 109 73          Figure Copy 11 63          Judgment of Line Orientation --- 51-75      Executive Functioning        Wisconsin Card Sorting Test - 64: T-score Percentile      Categories -- >16      Total Errors 59 82      Perseverative Errors 59 82      Nonperseverative Errors 51 54      Trail Making Test: T-Score Percentile      Part A 51 ---      Part B 53 ---      Clock Drawing Raw Score Descriptor      Command 9 Within Normal Limits      Rating Scales         Raw Score Descriptor  Patient Health Questionnaire - 9 8 Mild Depression  GAD-7 3 Minimal    Peter V. Nicole Kindred PsyD, [place] Clinical Neuropsychologist

## 2019-09-11 LAB — NOVEL CORONAVIRUS, NAA: SARS-CoV-2, NAA: NOT DETECTED

## 2019-09-11 NOTE — Progress Notes (Signed)
[facility]  Patient Name: Yolanda [name] MRN: [id number] Date of Birth: 1955/07/19 Age: 64 y.o. Education: 13 years  Clinical Impressions  Yolanda [name] is a 64 y.o., right-hand dominant, divorced woman with a history of speech disturbance for approximately 6 months and subjective memory and thinking problems over the past 2 years. Her functioning is still good as she is independent for all basic and instrumental activities of daily living and is also the primary caretaker for a daughter with numerous health problems and a niece who is autistic. She has no problems with finances, driving, or managing her medications. Since her initial appointment she had an MRI of the brain that I was able to review and the study is normal and does not show any areas of concerning volume loss or leukoaraiosis.   On neuropsychological testing, Yolanda [name] generally performed quite well. She demonstrated particularly strong scores on measures of attention/working memory, which presented at a superior level and had processing speed toward the upper aspect of the average range. She did have a number of low memory test scores that is unusual, but then did very well on the most challenging memory measure, which is not very concerning. Consideration given to the possible role of performance consistency/executive control factors. Performance was likewise good on measures of visuospatial and constructional functioning and executive abilities. Language findings were within gross limits of normal with the exception of a few very minor issues on sentence repetition that are of dubious clinical significance and may relate to psychometric properties of the task and the aforementioned executive control/performance consistency issues. She screened positive for the presence of depression but endorsed mainly somatic symptoms and reported minimal levels of anxiety.   The overall impression  is that of essentially normal neuropsychological function at the present time albeit with the possibility of some mild executive control deficits that do not clearly rise to the level of impairment. Considering all available evidence, there is no indication of a clinically evident neurocognitive disorder at this time. For consideration is the extent to which her current caregiving responsibilities may be resulting in stress, sleep difficulties, and exacerbating or causing her pain condition, thereby resulting in minor executive control difficulties and transient language disturbance. She does have many risk factors for somatization.    Diagnostic Impressions: Other symptoms and signs involving cognitive functions and awareness  Recommendations to be discussed with patient  Your performance and presentation on neuropsychological testing were consistent with essentially normal abilities in all areas. You actually scored very well on measures of attention and working memory, which fell at a superior level, better than 91% of other individuals your age. You did have a few low memory test scores but given the profile I do not think they are clearly indicative of any impairment and you did well on the most challenging memory measure. I also looked at your MRI of the brain, which appears to be normal. I would thus like to reassure you that there is no clear indication of an incipient dementia syndrome at this time.     You did report a fair amount of caregiving responsibilities and scored at a level on self-report measures that is consistent with a mild level of depression. This is with the caveat that most of the symptoms you reported were somatic in nature (I.e., sleep problems, feeling tired and having little energy, overeating). Individuals experience stress, depression, and anxiety in different ways. Sometimes, depression and anxiety are experienced as they are  commonly understood, with sad mood and  nervousness. Other times, people have underlying stress or affective problems that they are not fully aware of. This can sometimes put people at risk for a class of disorders called somatic symptom disorders. This is a class of disorders where psychiatric problems are experienced physically, in the body. They may also be experienced as difficulties with memory and thinking abilities, language, or other cognitive skills. Somatic symptom disorders typically involve vague physical symptoms affecting multiple bodily systems, things like pain, fatigue, malaise, not feeling at one's best, headaches, and dizziness for which no definitive medical cause can be determined. There are aspects of your presentation that make me think that you could be at risk for a somatic symptom disorder. If this is the case, then your speech disturbance could be the result of the stress you are under, and therefore I would suggest that you actively manage your stress level.   Make sure to take time for yourself in addition to your caregiving responsibilities. Things like going for walks, reading a book, socializing with friends, or just taking time to be with yourself are all ideas.   Caregiver burnout can lead to depression. It is also self-defeating, because it can interfere with a caregiver's ability to care for others. There is research that mortality in spouses who provide care to a demented loved one may be higher than for the individual with dementia themselves. For all these reasons, I recommend that you to actively manage your stress level and take as good care of yourself as you do of your loved one. Strategies that may help include:  . Splitting caregiving responsibilities amongst multiple family members. . Getting paid supports so that you can take some time to yourself. . Aggressively treating depression and anxiety that are understandable reactions to the stress of caregiving. This may include counseling or antidepressant  medication depending on your symptoms and treatment of choice.  . Even something simple such as scheduling small periods of time every day where you can engage in a desired hobby or pleasurable activity can be extremely helpful.   If you feel that you are having increased difficulties with speech or other cognitive abilities, it would be reasonable for you to return for reevaluation, although I do not think it necessary unless your symptoms worsen or continue to be particularly concerning to you.    Test Findings  Test scores are summarized in additional documentation associated with this encounter. Test scores are relative to age, gender, and educational history as available and appropriate. There were no concerns about performance validity despite one low validity test score that may reflect difficulties choosing amongst alternatives and/or indecisiveness. All other embedded and standalone validity measures were within normal limits.    General Intellectual Functioning/Achievement:  Performance was solidly within the average range on single word reading, which presents as a reasonable standard of comparison for Ms. Thaden's cognitive test performance.   Attention and Processing Efficiency: Performance was very good on indicators of attention and working memory, which fell at a superior level overall. High average to superior range performance was observed on measures of digit repetition including digit repetition forward, backward, and digit resequencing in ascending order. Timed completion of arithmetical word problems without paper and pencil was high average.   With regard to processing efficiency, Ms. Riviere generation good normal range scores at an index level and on two different measures emphasizing efficient visual matching and efficient visual scanning.   Language: Performance was normal within  this domain, with the caveat that she did demonstrate unusually low sentence repetition. The  errors she made were extremely minor and may be on the basis of executive control factors (e.g., changed one word in an entire sentence to a synonym). Visual object confrontation naming was normal. Generation of words in response to category and phonemic prompts was also normal. Qualitatively, her speech was fluent with one episode of having difficulty getting a word out with the technician.   Visuospatial Function: Performance was good on measures of visuospatial and constructional functioning, falling at the margin of the high average and average ranges on the RBANS visuospatial/constructional index. Copy of a line drawing was average and judgment of angular line orientations was average to high average.   Learning and Memory: Ms. Waldman demonstrated a number of low memory test scores that is considered unusual; however, she did well on the most challenging memory measure and the profile is not very concerning for frank memory impairment. Consideration is given to the role of executive control and cognitive efficiency factors.   In the verbal realm, learning a 12-item word list was average with good retention of information across time and high average performance on delayed recall. Yes/no recognition discriminability between words from the list versus false choices was average. She had a harder time learning a short story, with unusually low immediate recall and comparable performance on delayed recall. She did remember most of the story she had been read but her delayed recall score was low due to the level of initial encoding. Immediate recall for brief daily-living type information was average followed by unusually low delayed recall and recognition.   Delayed recall of a modestly complex figure was good and fell at an average level.    Executive Functions: Performance on measures of executive functioning was generally good with a high average score for total errors and perseverative errors on  the [employer] and a normal number of solution categories. She demonstrated average alternating sequencing of numbers and letters of the alphabet. Generation of words starting with the letters F-A-S was average in aggregate. Clock drawing was within normal limits with intact clock face and numbering but no size difference between the hands (the hands were, however, correctly set).   Rating Scale(s): Ms. Dray reported a level of symptomatology that can be considered mild with respect to depressive symptoms. All the symptoms she reported were somatic in nature such as having problems sleeping, feeling tired or having little energy, and overeating or undereating. She reported minimal levels of anxiety symptoms.   Viviano Simas Nicole Kindred PsyD, [place] Clinical Neuropsychologist

## 2019-09-12 ENCOUNTER — Ambulatory Visit (INDEPENDENT_AMBULATORY_CARE_PROVIDER_SITE_OTHER): Payer: Medicare Other | Admitting: Counselor

## 2019-09-12 ENCOUNTER — Encounter: Payer: Self-pay | Admitting: Counselor

## 2019-09-12 ENCOUNTER — Other Ambulatory Visit: Payer: Self-pay

## 2019-09-12 DIAGNOSIS — G4700 Insomnia, unspecified: Secondary | ICD-10-CM | POA: Diagnosis not present

## 2019-09-12 DIAGNOSIS — R4189 Other symptoms and signs involving cognitive functions and awareness: Secondary | ICD-10-CM

## 2019-09-12 NOTE — Progress Notes (Signed)
[facility]  I met with Yolanda [name] to review the findings resulting from her neuropsychological evaluation. Since the last appointment, she has been about the same. She is preparing for her daughter's surgery, she is getting a hip replacement, this upcoming Monday (in [place]). Time was spent reviewing the impressions and recommendations that are detailed in the evaluation report. Areas of specific emphasis included largely normal neuropsych performance albeit with some minor memory findings that do not clearly represent impairment, per se, and may reflect executive control/cognitive efficiency factors. We also discussed sleep hygiene and effects of sleep on cognition in detail. Interventions provided during this encounter included psychoeducation, and other topics as reflected in the patient instructions. I took time to explain the findings and answer all the patient's questions. I encouraged Yolanda [name] to contact me should she have any further questions or if further follow up is desired.   Current Medications and Medical History   Current Outpatient Medications  Medication Sig Dispense Refill  . aspirin 325 MG tablet Take 325 mg by mouth daily.    . Cholecalciferol (VITAMIN D3) 2000 units TABS Take 1 tablet by mouth daily.    Marland Kitchen CRANBERRY-VITAMIN C PO Take by mouth daily.    . cyclobenzaprine (FLEXERIL) 10 MG tablet Take 10 mg by mouth daily.    . DULoxetine (CYMBALTA) 30 MG capsule Take 1 capsule by mouth daily.    Marland Kitchen gabapentin (NEURONTIN) 100 MG capsule Take 100 mg by mouth daily.    . Levothyroxine Sodium 100 MCG CAPS Take 100 mcg by mouth daily before breakfast.     . meloxicam (MOBIC) 7.5 MG tablet Take 7.5 mg by mouth as needed for pain.    Marland Kitchen olmesartan-hydrochlorothiazide (BENICAR HCT) 40-25 MG tablet Take 1 tablet by mouth daily.    . Omega-3 Fatty Acids (FISH OIL) 1000 MG CAPS Take 1 capsule by mouth daily.    Marland Kitchen omeprazole (PRILOSEC) 20  MG capsule 1 po 30 mins prior to [place]. 90 capsule 3  . ondansetron (ZOFRAN) 4 MG tablet Take 4 mg by mouth every 8 (eight) hours as needed for nausea or vomiting.    Glory Rosebush DELICA LANCETS 53M MISC     . ONETOUCH VERIO test strip     . polyethylene glycol-electrolytes (TRILYTE) 420 g solution Take 4,000 mLs by mouth as directed. 4000 mL 0  . RESTASIS MULTIDOSE 0.05 % ophthalmic emulsion Apply 1 drop to eye as needed.     . rosuvastatin (CRESTOR) 10 MG tablet Take 10 mg by mouth daily.    . Turmeric 500 MG TABS Take 1 tablet by mouth daily.     No current facility-administered medications for this visit.    Patient Active Problem List   Diagnosis Date Noted  . History of colonic polyps 01/01/2019  . Liver cirrhosis secondary to NASH ([place]) 01/22/2018  . Dysphagia 04/19/2017  . Abdominal pain, epigastric 04/19/2017  . Abdominal pain 12/08/2014  . Postmenopausal bleeding 11/18/2014  . Closed fracture of distal clavicle 04/17/2014  . Muscle weakness (generalized) 03/26/2014  . Pain in joint, shoulder region 03/26/2014  . Decreased range of motion of right shoulder 03/26/2014  . Right clavicle fracture 03/19/2014  . Fatty liver 03/17/2014  . Rectal bleeding 03/17/2014  . Dyspepsia 03/11/2014  . Hypothyroidism 09/11/2012  . ADHD (attention deficit hyperactivity disorder) 09/11/2012  . Anxiety 09/11/2012  . GERD 12/21/2009  . IRRITABLE BOWEL SYNDROME 04/22/2009  . HYPERLIPIDEMIA 07/09/2008  . DEPRESSION 07/09/2008  .  HYPERTENSION 07/09/2008  . Gastritis and gastroduodenitis 07/09/2008  . SLEEP APNEA 07/09/2008    Mental Status and Behavioral Observations  Yolanda [name] was available for this telephonic appointment. She was alert and oriented and well grounded in situation. Her speech was fluent, prosodic, and without any articulatory issues or problems getting words out that I was able to detect. Her self-reported mood was "pretty good" and affect was euthymic and congruent  with that self-report. Thought process was logical, linear, and goal-directed. Thought content was appropriate. No safety concerns were identified at today's visit.   Plan  Feedback provided regarding the patient's neuropsychological evaluation. I wonder if there isn't a component of somatization, although the patient did not feel the need for counseling and wants to focus on her sleep and optimizing self-care as opposed to any formal treatment, which seems very reasonable. Lavoris D Strickling was encouraged to contact me if any questions arise or if further follow up is desired.   Peter V. Stewart, PsyD, ABN Clinical Neuropsychologist  Service(s) Provided at This Encounter: 30 minutes (90832; Psychotherapy with patient/family)  

## 2019-09-12 NOTE — Patient Instructions (Addendum)
Your performance and presentation on neuropsychological testing were consistent with essentially normal abilities in all areas. You actually scored very well on measures of attention and working memory, which fell at a superior level, better than 91% of other individuals your age. You did have a few low memory test scores but given the profile I do not think they are clearly indicative of any impairment and you did well on the most challenging memory measure. I also looked at your MRI of the brain, which appears to be normal. I would thus like to reassure you that there is no clear indication of an incipient dementia syndrome at this time.     You did report a fair amount of caregiving responsibilities and scored at a level on self-report measures that is consistent with a mild level of depression. This is with the caveat that most of the symptoms you reported were somatic in nature (I.e., sleep problems, feeling tired and having little energy, overeating). Individuals experience stress, depression, and anxiety in different ways. Sometimes, depression and anxiety are experienced as they are commonly understood, with sad mood and nervousness. Other times, people have underlying stress or affective problems that they are not fully aware of. This can sometimes put people at risk for a class of disorders called somatic symptom disorders. This is a class of disorders where psychiatric problems are experienced physically, in the body. They may also be experienced as difficulties with memory and thinking abilities, language, or other cognitive skills. Somatic symptom disorders typically involve vague physical symptoms affecting multiple bodily systems, things like pain, fatigue, malaise, not feeling at one's best, headaches, and dizziness for which no definitive medical cause can be determined. There are aspects of your presentation that make me think that you could be at risk for a somatic symptom disorder. If this is  the case, then your speech disturbance could be the result of the stress you are under, and therefore I would suggest that you actively manage your stress level.   Make sure to take time for yourself in addition to your caregiving responsibilities. Things like going for walks, reading a book, socializing with friends, or just taking time to be with yourself are all ideas.   There are few things as disruptive to brain functioning as not getting a good night's sleep. For sleep, I recommend against using medications, which can have lingering sedating effects on the brain and rob your brain of restful REM sleep. Instead, consider trying some of the following sleep hygiene recommendations. They may not work at once and may take effort, but the effort you spend is likely to be rewarded with better sleep eventually:  . Stick to a sleep schedule of the same bedtime and wake time even on the weekends, which can help to regulate your body's internal clock so that you fall asleep and stay asleep.  . Practice a relaxing bedtime ritual (conducted away from bright lights) which will help separate your sleep from stimulating activities and prepare your body to fall asleep when you go to bed.  . Avoid naps, especially in the afternoon.  . Evaluate your room and create conditions that will promote sleep such as keeping it cool (between 60 - 67 degrees), quiet, and free from any lights. Consider using blackout curtains, a "white noise" generator, or fan that will help mask any noises that might prevent you from going to sleep or awaken you during the night.  . Sleep on a comfortable mattress and pillows.  Marland Kitchen  Avoid bright light in the evening and excessive use of portable electronic devices right before bed that may contain light frequencies that can contribute to sleep problems.  . Avoid alcohol, cigarettes, or heavy meals in the evening. If you must eat, consume a light snack 45 minutes before bed.  . Use your bed only  for sleep to strengthen the association between your bed and sleep.  . If you can't go to sleep within 30 minutes, go into another room and do something relaxing until you feel tired. Then, come back and try to go to sleep again for 30 minutes and repeat until sleep is achieved.  . Some people find over the counter melatonin to be helpful for sleep, which you could discuss with a pharmacist or prescribing provider.    If you feel that you are having increased difficulties with speech or other cognitive abilities, it would be reasonable for you to return for reevaluation, although I do not think it necessary unless your symptoms worsen or continue to be particularly concerning to you.

## 2019-10-06 ENCOUNTER — Ambulatory Visit: Payer: Medicare Other | Attending: Internal Medicine

## 2019-10-06 DIAGNOSIS — Z23 Encounter for immunization: Secondary | ICD-10-CM

## 2019-10-06 NOTE — Progress Notes (Signed)
   [facility]  Name:  Yolanda [name]    MRN: [id number] DOB: 1956/02/05  10/06/2019  [name] was observed post Covid-19 immunization for 15 minutes without incident. She was provided with Vaccine Information Sheet and instruction to access the V-Safe system.   [name] was instructed to call 911 with any severe reactions post vaccine: Marland Kitchen Difficulty breathing  . Swelling of face and throat  . A fast heartbeat  . A bad rash all over body  . Dizziness and weakness   Immunizations Administered    Name Date Dose VIS Date Route   Pfizer COVID-19 Vaccine 10/06/2019  1:51 PM 0.3 mL 06/20/2019 Intramuscular   Manufacturer: [place]   Lot: [id number]   [place]: [id number]

## 2019-10-23 NOTE — Patient Instructions (Signed)
LUCCIA REINHEIMER  10/23/2019     @PREFPERIOPPHARMACY @   Your procedure is scheduled on  10/28/2019 .  Report to [facility] at  0700  A.M.  Call this number if you have problems the morning of surgery:  [phone]   Remember:  Follow the diet and prep instructions given yo you by Dr Nona Dell office.                      Take these medicines the morning of surgery with A SIP OF WATER  Flexeril, cymbalta, gabapentin, prilosec.    Do not wear jewelry, make-up or nail polish.  Do not wear lotions, powders, or perfumes. Please wear deodorant and brush your teeth.  Do not shave 48 hours prior to surgery.  Men may shave face and neck.  Do not bring valuables to the hospital.  [facility] is not responsible for any belongings or valuables.  Contacts, dentures or bridgework may not be worn into surgery.  Leave your suitcase in the car.  After surgery it may be brought to your room.  For patients admitted to the hospital, discharge time will be determined by your treatment team.  Patients discharged the day of surgery will not be allowed to drive home.   Name and phone number of your driver:   family Special instructions:  DO NOT smoke the morning of your procedure.  Please read over the following fact sheets that you were given. Anesthesia Post-op Instructions and Care and Recovery After Surgery       Upper Endoscopy, Adult, Care After This sheet gives you information about how to care for yourself after your procedure. Your health care provider may also give you more specific instructions. If you have problems or questions, contact your health care provider. What can I expect after the procedure? After the procedure, it is common to have:  A sore throat.  Mild stomach pain or discomfort.  Bloating.  Nausea. Follow these instructions at home:   Follow instructions from your health care provider about what to eat or drink after your procedure.  Return  to your normal activities as told by your health care provider. Ask your health care provider what activities are safe for you.  Take over-the-counter and prescription medicines only as told by your health care provider.  Do not drive for 24 hours if you were given a sedative during your procedure.  Keep all follow-up visits as told by your health care provider. This is important. Contact a health care provider if you have:  A sore throat that lasts longer than one day.  Trouble swallowing. Get help right away if:  You vomit blood or your vomit looks like coffee grounds.  You have: ? A fever. ? Bloody, black, or tarry stools. ? A severe sore throat or you cannot swallow. ? Difficulty breathing. ? Severe pain in your chest or abdomen. Summary  After the procedure, it is common to have a sore throat, mild stomach discomfort, bloating, and nausea.  Do not drive for 24 hours if you were given a sedative during the procedure.  Follow instructions from your health care provider about what to eat or drink after your procedure.  Return to your normal activities as told by your health care provider. This information is not intended to replace advice given to you by your health care provider. Make sure you discuss any questions you have with  your health care provider. Document Revised: 12/18/2017 Document Reviewed: 11/26/2017 Elsevier Patient Education  [place].  Esophageal Dilatation Esophageal dilatation, also called esophageal dilation, is a procedure to widen or open (dilate) a blocked or narrowed part of the esophagus. The esophagus is the part of the body that moves food and liquid from the mouth to the stomach. You may need this procedure if:  You have a buildup of scar tissue in your esophagus that makes it difficult, painful, or impossible to swallow. This can be caused by gastroesophageal reflux disease (GERD).  You have cancer of the esophagus.  There is a  problem with how food moves through your esophagus. In some cases, you may need this procedure repeated at a later time to dilate the esophagus gradually. Tell a health care provider about:  Any allergies you have.  All medicines you are taking, including vitamins, herbs, eye drops, creams, and over-the-counter medicines.  Any problems you or family members have had with anesthetic medicines.  Any blood disorders you have.  Any surgeries you have had.  Any medical conditions you have.  Any antibiotic medicines you are required to take before dental procedures.  Whether you are pregnant or may be pregnant. What are the risks? Generally, this is a safe procedure. However, problems may occur, including:  Bleeding due to a tear in the lining of the esophagus.  A hole (perforation) in the esophagus. What happens before the procedure?  Follow instructions from your health care provider about eating or drinking restrictions.  Ask your health care provider about changing or stopping your regular medicines. This is especially important if you are taking diabetes medicines or blood thinners.  Plan to have someone take you home from the hospital or clinic.  Plan to have a responsible adult care for you for at least 24 hours after you leave the hospital or clinic. This is important. What happens during the procedure?  You may be given a medicine to help you relax (sedative).  A numbing medicine may be sprayed into the back of your throat, or you may gargle the medicine.  Your health care provider may perform the dilatation using various surgical instruments, such as: ? Simple dilators. This instrument is carefully placed in the esophagus to stretch it. ? Guided wire bougies. This involves using an endoscope to insert a wire into the esophagus. A dilator is passed over this wire to enlarge the esophagus. Then the wire is removed. ? Balloon dilators. An endoscope with a small balloon at  the end is inserted into the esophagus. The balloon is inflated to stretch the esophagus and open it up. The procedure may vary among health care providers and hospitals. What happens after the procedure?  Your blood pressure, heart rate, breathing rate, and blood oxygen level will be monitored until the medicines you were given have worn off.  Your throat may feel slightly sore and numb. This will improve slowly over time.  You will not be allowed to eat or drink until your throat is no longer numb.  When you are able to drink, urinate, and sit on the edge of the bed without nausea or dizziness, you may be able to return home. Follow these instructions at home:  Take over-the-counter and prescription medicines only as told by your health care provider.  Do not drive for 24 hours if you were given a sedative during your procedure.  You should have a responsible adult with you for 24 hours after  the procedure.  Follow instructions from your health care provider about any eating or drinking restrictions.  Do not use any products that contain nicotine or tobacco, such as cigarettes and e-cigarettes. If you need help quitting, ask your health care provider.  Keep all follow-up visits as told by your health care provider. This is important. Get help right away if you:  Have a fever.  Have chest pain.  Have pain that is not relieved by medication.  Have trouble breathing.  Have trouble swallowing.  Vomit blood. Summary  Esophageal dilatation, also called esophageal dilation, is a procedure to widen or open (dilate) a blocked or narrowed part of the esophagus.  Plan to have someone take you home from the hospital or clinic.  For this procedure, a numbing medicine may be sprayed into the back of your throat, or you may gargle the medicine.  Do not drive for 24 hours if you were given a sedative during your procedure. This information is not intended to replace advice given to  you by your health care provider. Make sure you discuss any questions you have with your health care provider. Document Revised: 04/23/2019 Document Reviewed: 05/01/2017 Elsevier Patient Education  [place].  Colonoscopy, Adult, Care After This sheet gives you information about how to care for yourself after your procedure. Your health care provider may also give you more specific instructions. If you have problems or questions, contact your health care provider. What can I expect after the procedure? After the procedure, it is common to have:  A small amount of blood in your stool for 24 hours after the procedure.  Some gas.  Mild cramping or bloating of your abdomen. Follow these instructions at home: Eating and drinking   Drink enough fluid to keep your urine pale yellow.  Follow instructions from your health care provider about eating or drinking restrictions.  Resume your normal diet as instructed by your health care provider. Avoid heavy or fried foods that are hard to digest. Activity  Rest as told by your health care provider.  Avoid sitting for a long time without moving. Get up to take short walks every 1-2 hours. This is important to improve blood flow and breathing. Ask for help if you feel weak or unsteady.  Return to your normal activities as told by your health care provider. Ask your health care provider what activities are safe for you. Managing cramping and bloating   Try walking around when you have cramps or feel bloated.  Apply heat to your abdomen as told by your health care provider. Use the heat source that your health care provider recommends, such as a moist heat pack or a heating pad. ? Place a towel between your skin and the heat source. ? Leave the heat on for 20-30 minutes. ? Remove the heat if your skin turns bright red. This is especially important if you are unable to feel pain, heat, or cold. You may have a greater risk of getting  burned. General instructions  For the first 24 hours after the procedure: ? Do not drive or use machinery. ? Do not sign important documents. ? Do not drink alcohol. ? Do your regular daily activities at a slower pace than normal. ? Eat soft foods that are easy to digest.  Take over-the-counter and prescription medicines only as told by your health care provider.  Keep all follow-up visits as told by your health care provider. This is important. Contact a health care  provider if:  You have blood in your stool 2-3 days after the procedure. Get help right away if you have:  More than a small spotting of blood in your stool.  Large blood clots in your stool.  Swelling of your abdomen.  Nausea or vomiting.  A fever.  Increasing pain in your abdomen that is not relieved with medicine. Summary  After the procedure, it is common to have a small amount of blood in your stool. You may also have mild cramping and bloating of your abdomen.  For the first 24 hours after the procedure, do not drive or use machinery, sign important documents, or drink alcohol.  Get help right away if you have a lot of blood in your stool, nausea or vomiting, a fever, or increased pain in your abdomen. This information is not intended to replace advice given to you by your health care provider. Make sure you discuss any questions you have with your health care provider. Document Revised: 01/20/2019 Document Reviewed: 01/20/2019 Elsevier Patient Education  [name] After These instructions provide you with information about caring for yourself after your procedure. Your health care provider may also give you more specific instructions. Your treatment has been planned according to current medical practices, but problems sometimes occur. Call your health care provider if you have any problems or questions after your procedure. What can I expect after the  procedure? After your procedure, you may:  Feel sleepy for several hours.  Feel clumsy and have poor balance for several hours.  Feel forgetful about what happened after the procedure.  Have poor judgment for several hours.  Feel nauseous or vomit.  Have a sore throat if you had a breathing tube during the procedure. Follow these instructions at home: For at least 24 hours after the procedure:      Have a responsible adult stay with you. It is important to have someone help care for you until you are awake and alert.  Rest as needed.  Do not: ? Participate in activities in which you could fall or become injured. ? Drive. ? Use heavy machinery. ? Drink alcohol. ? Take sleeping pills or medicines that cause drowsiness. ? Make important decisions or sign legal documents. ? Take care of children on your own. Eating and drinking  Follow the diet that is recommended by your health care provider.  If you vomit, drink water, juice, or soup when you can drink without vomiting.  Make sure you have little or no nausea before eating solid foods. General instructions  Take over-the-counter and prescription medicines only as told by your health care provider.  If you have sleep apnea, surgery and certain medicines can increase your risk for breathing problems. Follow instructions from your health care provider about wearing your sleep device: ? Anytime you are sleeping, including during daytime naps. ? While taking prescription pain medicines, sleeping medicines, or medicines that make you drowsy.  If you smoke, do not smoke without supervision.  Keep all follow-up visits as told by your health care provider. This is important. Contact a health care provider if:  You keep feeling nauseous or you keep vomiting.  You feel light-headed.  You develop a rash.  You have a fever. Get help right away if:  You have trouble breathing. Summary  For several hours after your  procedure, you may feel sleepy and have poor judgment.  Have a responsible adult stay with you for at least 24  hours or until you are awake and alert. This information is not intended to replace advice given to you by your health care provider. Make sure you discuss any questions you have with your health care provider. Document Revised: 09/24/2017 Document Reviewed: 10/17/2015 Elsevier Patient Education  [place].

## 2019-10-24 ENCOUNTER — Encounter (HOSPITAL_COMMUNITY): Payer: Self-pay

## 2019-10-24 ENCOUNTER — Other Ambulatory Visit (HOSPITAL_COMMUNITY)
Admission: RE | Admit: 2019-10-24 | Discharge: 2019-10-24 | Disposition: A | Discharge status: Home / Self Care | Payer: Medicare Other | Source: Ambulatory Visit | Attending: Gastroenterology | Admitting: Gastroenterology

## 2019-10-24 ENCOUNTER — Encounter (HOSPITAL_COMMUNITY)
Admission: RE | Admit: 2019-10-24 | Discharge: 2019-10-24 | Disposition: A | Discharge status: Home / Self Care | Payer: Medicare Other | Source: Ambulatory Visit | Attending: Gastroenterology | Admitting: Gastroenterology

## 2019-10-24 ENCOUNTER — Other Ambulatory Visit: Payer: Self-pay

## 2019-10-24 DIAGNOSIS — Z01812 Encounter for preprocedural laboratory examination: Secondary | ICD-10-CM | POA: Diagnosis not present

## 2019-10-24 DIAGNOSIS — Z20822 Contact with and (suspected) exposure to covid-19: Secondary | ICD-10-CM | POA: Diagnosis not present

## 2019-10-24 HISTORY — DX: Personal history of urinary calculi: Z87.442

## 2019-10-24 HISTORY — DX: Family history of other specified conditions: Z84.89

## 2019-10-24 LAB — COMPREHENSIVE METABOLIC PANEL
ALT: 40 U/L (ref 0–44)
AST: 54 U/L — ABNORMAL HIGH (ref 15–41)
Albumin: 2.9 g/dL — ABNORMAL LOW (ref 3.5–5.0)
Alkaline Phosphatase: 97 U/L (ref 38–126)
Anion gap: 8 (ref 5–15)
BUN: 11 mg/dL (ref 8–23)
CO2: 26 mmol/L (ref 22–32)
Calcium: 8.7 mg/dL — ABNORMAL LOW (ref 8.9–10.3)
Chloride: 103 mmol/L (ref 98–111)
Creatinine, Ser: 0.49 mg/dL (ref 0.44–1.00)
GFR calc Af Amer: >60 mL/min (ref >60)
GFR calc non Af Amer: >60 mL/min (ref >60)
Glucose, Bld: 230 mg/dL — ABNORMAL HIGH (ref 70–99)
Potassium: 3.5 mmol/L (ref 3.5–5.1)
Sodium: 137 mmol/L (ref 135–145)
Total Bilirubin: 0.8 mg/dL (ref 0.3–1.2)
Total Protein: 6.7 g/dL (ref 6.5–8.1)

## 2019-10-24 LAB — CBC WITH DIFFERENTIAL/PLATELET
Abs Immature Granulocytes: 0.01 10*3/uL (ref 0.00–0.07)
Basophils Absolute: 0 10*3/uL (ref 0.0–0.1)
Basophils Relative: 1 %
Eosinophils Absolute: 0.1 10*3/uL (ref 0.0–0.5)
Eosinophils Relative: 1 %
HCT: 41.2 % (ref 36.0–46.0)
Hemoglobin: 13.5 g/dL (ref 12.0–15.0)
Immature Granulocytes: 0 %
Lymphocytes Relative: 31 %
Lymphs Abs: 2.1 10*3/uL (ref 0.7–4.0)
MCH: 31.7 pg (ref 26.0–34.0)
MCHC: 32.8 g/dL (ref 30.0–36.0)
MCV: 96.7 fL (ref 80.0–100.0)
Monocytes Absolute: 0.5 10*3/uL (ref 0.1–1.0)
Monocytes Relative: 8 %
Neutro Abs: 4 10*3/uL (ref 1.7–7.7)
Neutrophils Relative %: 59 %
Platelets: 143 10*3/uL — ABNORMAL LOW (ref 150–400)
RBC: 4.26 MIL/uL (ref 3.87–5.11)
RDW: 13 % (ref 11.5–15.5)
WBC: 6.7 10*3/uL (ref 4.0–10.5)
nRBC: 0 % (ref 0.0–0.2)

## 2019-10-24 LAB — PROTIME-INR
INR: 1.1 (ref 0.8–1.2)
Prothrombin Time: 13.9 s (ref 11.4–15.2)

## 2019-10-25 LAB — SARS CORONAVIRUS 2 (TAT 6-24 HRS): SARS Coronavirus 2: NEGATIVE

## 2019-10-28 ENCOUNTER — Ambulatory Visit (HOSPITAL_COMMUNITY): Payer: Medicare Other | Admitting: Certified Registered Nurse Anesthetist

## 2019-10-28 ENCOUNTER — Other Ambulatory Visit: Payer: Self-pay

## 2019-10-28 ENCOUNTER — Ambulatory Visit (HOSPITAL_COMMUNITY)
Admission: RE | Admit: 2019-10-28 | Discharge: 2019-10-28 | Disposition: A | Discharge status: Home / Self Care | Payer: Medicare Other | Attending: Gastroenterology | Admitting: Gastroenterology

## 2019-10-28 ENCOUNTER — Encounter (HOSPITAL_COMMUNITY)
Admission: RE | Disposition: A | Discharge status: Home / Self Care | Payer: Self-pay | Source: Home / Self Care | Attending: Gastroenterology

## 2019-10-28 ENCOUNTER — Encounter (HOSPITAL_COMMUNITY): Payer: Self-pay | Admitting: Gastroenterology

## 2019-10-28 DIAGNOSIS — D125 Benign neoplasm of sigmoid colon: Secondary | ICD-10-CM | POA: Diagnosis not present

## 2019-10-28 DIAGNOSIS — K621 Rectal polyp: Secondary | ICD-10-CM | POA: Diagnosis not present

## 2019-10-28 DIAGNOSIS — K635 Polyp of colon: Secondary | ICD-10-CM | POA: Diagnosis not present

## 2019-10-28 DIAGNOSIS — K589 Irritable bowel syndrome without diarrhea: Secondary | ICD-10-CM | POA: Insufficient documentation

## 2019-10-28 DIAGNOSIS — D128 Benign neoplasm of rectum: Secondary | ICD-10-CM | POA: Insufficient documentation

## 2019-10-28 DIAGNOSIS — Z881 Allergy status to other antibiotic agents status: Secondary | ICD-10-CM | POA: Insufficient documentation

## 2019-10-28 DIAGNOSIS — K219 Gastro-esophageal reflux disease without esophagitis: Secondary | ICD-10-CM | POA: Diagnosis not present

## 2019-10-28 DIAGNOSIS — K760 Fatty (change of) liver, not elsewhere classified: Secondary | ICD-10-CM | POA: Insufficient documentation

## 2019-10-28 DIAGNOSIS — M797 Fibromyalgia: Secondary | ICD-10-CM | POA: Diagnosis not present

## 2019-10-28 DIAGNOSIS — Z1211 Encounter for screening for malignant neoplasm of colon: Secondary | ICD-10-CM | POA: Insufficient documentation

## 2019-10-28 DIAGNOSIS — K648 Other hemorrhoids: Secondary | ICD-10-CM | POA: Insufficient documentation

## 2019-10-28 DIAGNOSIS — M1990 Unspecified osteoarthritis, unspecified site: Secondary | ICD-10-CM | POA: Diagnosis not present

## 2019-10-28 DIAGNOSIS — R1310 Dysphagia, unspecified: Secondary | ICD-10-CM | POA: Diagnosis not present

## 2019-10-28 DIAGNOSIS — Z7982 Long term (current) use of aspirin: Secondary | ICD-10-CM | POA: Diagnosis not present

## 2019-10-28 DIAGNOSIS — Z87891 Personal history of nicotine dependence: Secondary | ICD-10-CM | POA: Insufficient documentation

## 2019-10-28 DIAGNOSIS — D123 Benign neoplasm of transverse colon: Secondary | ICD-10-CM | POA: Diagnosis not present

## 2019-10-28 DIAGNOSIS — K644 Residual hemorrhoidal skin tags: Secondary | ICD-10-CM | POA: Diagnosis not present

## 2019-10-28 DIAGNOSIS — Z860100 Personal history of colon polyps, unspecified: Secondary | ICD-10-CM

## 2019-10-28 DIAGNOSIS — K2950 Unspecified chronic gastritis without bleeding: Secondary | ICD-10-CM | POA: Insufficient documentation

## 2019-10-28 DIAGNOSIS — I10 Essential (primary) hypertension: Secondary | ICD-10-CM | POA: Insufficient documentation

## 2019-10-28 DIAGNOSIS — Z79890 Hormone replacement therapy (postmenopausal): Secondary | ICD-10-CM | POA: Diagnosis not present

## 2019-10-28 DIAGNOSIS — K3189 Other diseases of stomach and duodenum: Secondary | ICD-10-CM | POA: Diagnosis not present

## 2019-10-28 DIAGNOSIS — E039 Hypothyroidism, unspecified: Secondary | ICD-10-CM | POA: Insufficient documentation

## 2019-10-28 DIAGNOSIS — D122 Benign neoplasm of ascending colon: Secondary | ICD-10-CM | POA: Diagnosis not present

## 2019-10-28 DIAGNOSIS — F329 Major depressive disorder, single episode, unspecified: Secondary | ICD-10-CM | POA: Diagnosis not present

## 2019-10-28 DIAGNOSIS — K766 Portal hypertension: Secondary | ICD-10-CM | POA: Diagnosis not present

## 2019-10-28 DIAGNOSIS — Z86711 Personal history of pulmonary embolism: Secondary | ICD-10-CM | POA: Insufficient documentation

## 2019-10-28 DIAGNOSIS — Q438 Other specified congenital malformations of intestine: Secondary | ICD-10-CM | POA: Diagnosis not present

## 2019-10-28 DIAGNOSIS — Z888 Allergy status to other drugs, medicaments and biological substances status: Secondary | ICD-10-CM | POA: Insufficient documentation

## 2019-10-28 DIAGNOSIS — Z79899 Other long term (current) drug therapy: Secondary | ICD-10-CM | POA: Insufficient documentation

## 2019-10-28 DIAGNOSIS — Z882 Allergy status to sulfonamides status: Secondary | ICD-10-CM | POA: Insufficient documentation

## 2019-10-28 HISTORY — PX: COLONOSCOPY WITH PROPOFOL: SHX5780

## 2019-10-28 HISTORY — PX: BIOPSY: SHX5522

## 2019-10-28 HISTORY — PX: POLYPECTOMY: SHX5525

## 2019-10-28 HISTORY — PX: SAVORY DILATION: SHX5439

## 2019-10-28 HISTORY — PX: ESOPHAGOGASTRODUODENOSCOPY (EGD) WITH PROPOFOL: SHX5813

## 2019-10-28 LAB — GLUCOSE, CAPILLARY: Glucose-Capillary: 107 mg/dL — ABNORMAL HIGH (ref 70–99)

## 2019-10-28 SURGERY — COLONOSCOPY WITH PROPOFOL
Anesthesia: General

## 2019-10-28 MED ORDER — LIDOCAINE HCL (CARDIAC) PF 100 MG/5ML IV SOSY
PREFILLED_SYRINGE | INTRAVENOUS | Status: DC | PRN
  Administered 2019-10-28: 50 mg via INTRATRACHEAL

## 2019-10-28 MED ORDER — LIDOCAINE 2% (20 MG/ML) 5 ML SYRINGE
INTRAMUSCULAR | Status: AC
  Filled 2019-10-28: qty 5

## 2019-10-28 MED ORDER — CHLORHEXIDINE GLUCONATE CLOTH 2 % EX PADS: 6.0000 | MEDICATED_PAD | Freq: Once | CUTANEOUS | Status: DC

## 2019-10-28 MED ORDER — PROPOFOL 10 MG/ML IV BOLUS
INTRAVENOUS | Status: AC
  Filled 2019-10-28: qty 80

## 2019-10-28 MED ORDER — PROPOFOL 500 MG/50ML IV EMUL
INTRAVENOUS | Status: DC | PRN
  Administered 2019-10-28: 100 ug/kg/min via INTRAVENOUS

## 2019-10-28 MED ORDER — LACTATED RINGERS IV SOLN: INTRAVENOUS | Status: DC

## 2019-10-28 MED ORDER — STERILE WATER FOR IRRIGATION IR SOLN
Status: DC | PRN
  Administered 2019-10-28: 1.5 mL

## 2019-10-28 MED ORDER — PROPOFOL 10 MG/ML IV BOLUS
INTRAVENOUS | Status: DC | PRN
  Administered 2019-10-28: 100 ug via INTRAVENOUS
  Administered 2019-10-28: 50 ug via INTRAVENOUS

## 2019-10-28 MED ORDER — KETAMINE HCL 10 MG/ML IJ SOLN
INTRAMUSCULAR | Status: DC | PRN
  Administered 2019-10-28: 10 mg via INTRAVENOUS
  Administered 2019-10-28: 20 mg via INTRAVENOUS

## 2019-10-28 MED ORDER — PROPOFOL 10 MG/ML IV BOLUS
INTRAVENOUS | Status: AC
  Filled 2019-10-28: qty 40

## 2019-10-28 MED ORDER — KETAMINE HCL 50 MG/5ML IJ SOSY
PREFILLED_SYRINGE | INTRAMUSCULAR | Status: AC
  Filled 2019-10-28: qty 5

## 2019-10-28 NOTE — H&P (Signed)
Primary Care Physician:  Sharilyn Sites, MD Primary Gastroenterologist:  Dr. [name]  Pre-Procedure History & Physical: HPI:  Yolanda [name] is a 64 y.o. female here for PERSONAL HISTORY OF POLYPS/DYSPHAGIA.  Past Medical History:  Diagnosis Date  . Abnormal uterine bleeding   . Anxiety   . Arthritis   . Calculus of kidney 07/09/2008   Qualifier: Diagnosis of  By: Kellie Simmering LPN, Almyra Free    . Chronic abdominal pain   . Chronic pain in left foot   . Clotting disorder ([place])   . Depression   . Elevated liver function tests 2018  . Endometriosis   . Family history of adverse reaction to anesthesia   . Fatty liver   . Fibromyalgia   . Gastritis   . GERD 12/21/2009   Qualifier: Diagnosis of  By: Craige Cotta    . History of kidney stones   . Hypertension    stopped meds in Aug 2015  . Hypothyroidism   . IBS (irritable bowel syndrome)   . Internal hemorrhoids   . Kidney stone   . Obesity, morbid ([place]) 09/11/2012  . PE 07/09/2008   Qualifier: Diagnosis of  By: Kellie Simmering LPN, Almyra Free    . PE (pulmonary embolism)   . PONV (postoperative nausea and vomiting)   . Sleep apnea   . Thyroid disease   . Vitamin D deficiency     Past Surgical History:  Procedure Laterality Date  . ABDOMINAL SURGERY     laparoscopy  . BIOPSY N/A 03/24/2014   Procedure: GASTRIC BIOPSIES;  Surgeon: Danie Binder, MD;  Location: [facility];  Service: Endoscopy;  Laterality: N/A;  . BREAST LUMPECTOMY Right   . CESAREAN SECTION     X2  . COLONOSCOPY  2006   internal hemorrhoids  . COLONOSCOPY WITH PROPOFOL N/A 03/24/2014   Dr. [name]: 2 tubular adenomas removed, hemorrhoids  . CYSTOSCOPY W/ RETROGRADES  01/23/2012   Procedure: CYSTOSCOPY WITH RETROGRADE PYELOGRAM;  Surgeon: Marissa Nestle, MD;  Location: [facility];  Service: Urology;  Laterality: Left;  . DILATATION & CURETTAGE/HYSTEROSCOPY WITH MYOSURE N/A 11/18/2014   Procedure: DILATATION & CURETTAGE/HYSTEROSCOPY WITH MYOSURE, resection of polyp;  Surgeon: Cheri Fowler, MD;  Location: [facility];  Service: Gynecology;  Laterality: N/A;  . DILATION AND CURETTAGE OF UTERUS    . ESOPHAGOGASTRODUODENOSCOPY   08/24/2006   Dr. Veto Kemps erythema of the antrum without erosion or ulcers/Otherwise, normal esophagus without evidence of Barrett's path with chronic gastritis  . ESOPHAGOGASTRODUODENOSCOPY (EGD) WITH PROPOFOL N/A 03/24/2014   Dr. [name]: gastritis  . POLYPECTOMY N/A 03/24/2014   Procedure: POLYPECTOMY;  Surgeon: Danie Binder, MD;  Location: [facility];  Service: Endoscopy;  Laterality: N/A;  . REMOVAL OF STONES  01/23/2012   Procedure: REMOVAL OF STONES;  Surgeon: Marissa Nestle, MD;  Location: [facility];  Service: Urology;  Laterality: N/A;  . TUBAL LIGATION      Prior to Admission medications   Medication Sig Start Date End Date Taking? Authorizing Provider  acetaminophen (TYLENOL) 500 MG tablet Take 500-1,000 mg by mouth every 6 (six) hours as needed for moderate pain or headache.   Yes [provider]  aspirin EC 81 MG tablet Take 81 mg by mouth daily.   Yes [provider]  Cholecalciferol (VITAMIN D3) 2000 units TABS Take 2,000 Units by mouth daily.    Yes [provider]  clobetasol ointment (TEMOVATE) 9.92 % Apply 1 application topically every evening. Apply to lower legs 07/02/19  Yes  [provider]  CRANBERRY-VITAMIN C PO Take 1 tablet by mouth daily.    Yes [provider]  cyclobenzaprine (FLEXERIL) 5 MG tablet Take 5 mg by mouth 3 (three) times daily as needed for muscle spasms.   Yes [provider]  DULoxetine (CYMBALTA) 30 MG capsule Take 30 mg by mouth daily.  02/20/17  Yes [provider]  fluocinonide (LIDEX) 0.05 % external solution Apply 1 application topically See admin instructions. Apply topically every 4 days   Yes [provider]  gabapentin (NEURONTIN) 100 MG capsule Take 100 mg by mouth daily.   Yes [provider]  hydrocortisone 2.5 %  cream Apply 1 application topically daily.   Yes [provider]  ketoconazole (NIZORAL) 2 % cream Apply 1 application topically daily.   Yes [provider]  Levothyroxine Sodium 100 MCG CAPS Take 100 mcg by mouth daily before breakfast.    Yes [provider]  Melatonin 10 MG CAPS Take 10 mg by mouth at bedtime as needed (sleep).   Yes [provider]  metroNIDAZOLE (METROCREAM) 0.75 % cream Apply 1 application topically 2 (two) times daily.   Yes [provider]  naproxen sodium (ALEVE) 220 MG tablet Take 220 mg by mouth daily as needed (pain).   Yes [provider]  olmesartan-hydrochlorothiazide (BENICAR HCT) 40-25 MG tablet Take 1 tablet by mouth daily. 02/20/17  Yes [provider]  Omega-3 Fatty Acids (FISH OIL) 1000 MG CAPS Take 1,000 mg by mouth daily.    Yes [provider]  omeprazole (PRILOSEC) 20 MG capsule 1 po 30 mins prior to FIRST MEAL. Patient taking differently: Take 20 mg by mouth daily.  07/24/19  Yes Avalie Oconnor L, MD  ondansetron (ZOFRAN) 4 MG tablet Take 4 mg by mouth every 8 (eight) hours as needed for nausea or vomiting.   Yes [provider]  polyethylene glycol-electrolytes (TRILYTE) 420 g solution Take 4,000 mLs by mouth as directed. 08/04/19  Yes Wenona Mayville L, MD  rosuvastatin (CRESTOR) 10 MG tablet Take 10 mg by mouth daily.   Yes [provider]  Turmeric 500 MG TABS Take 500 mg by mouth daily.    Yes [provider]    Allergies as of 08/04/2019 - Review Complete 08/04/2019  Allergen Reaction Noted  . Azithromycin Hives   . Lisinopril  04/19/2017  . Sulfonamide derivatives Other (See Comments)     Family History  Problem Relation Age of Onset  . Heart attack Mother        CABG  . Stroke Mother   . Hypertension Mother   . Cancer Mother   . Diabetes Mother   . Heart attack Father        CABG  . Hypertension Father   . Mesothelioma Father   . Heart  attack Brother   . Diabetes Brother   . Depression Brother   . Hyperlipidemia Brother   . Diabetes Maternal Grandmother   . Diabetes Maternal Grandfather   . Cancer Paternal Grandmother   . Diabetes Brother   . Hyperlipidemia Brother   . Wilson's disease Other        2 nieces, one died in her 77s  . Colon cancer Neg Hx     Social History   Socioeconomic History  . Marital status: Divorced    Spouse name: Not on file  . Number of children: 2  . Years of education: 34  . Highest education level: Not on file  Occupational History  . Occupation: Social research officer, government: [employer]  Tobacco Use  . Smoking status: Former Smoker    Years: 20.00    Types: Cigarettes    Quit date: 10/24/1998    Years since quitting: 21.0  . Smokeless tobacco: Never Used  . Tobacco comment: 1 cig daily  Substance and Sexual Activity  . Alcohol use: Not Currently    Comment: "no alcohol in years"  . Drug use: No  . Sexual activity: Never    Birth control/protection: Post-menopausal  Other Topics Concern  . Not on file  Social History Narrative   Right handed   One story with a basement   Drinks caffeine   Social Determinants of Health   Financial Resource Strain:   . Difficulty of Paying Living Expenses:   Food Insecurity:   . Worried About Charity fundraiser in the Last Year:   . Arboriculturist in the Last Year:   Transportation Needs:   . Film/video editor (Medical):   Marland Kitchen Lack of Transportation (Non-Medical):   Physical Activity:   . Days of Exercise per Week:   . Minutes of Exercise per Session:   Stress:   . Feeling of Stress :   Social Connections:   . Frequency of Communication with Friends and Family:   . Frequency of Social Gatherings with Friends and Family:   . Attends Religious Services:   . Active Member of Clubs or Organizations:   . Attends Archivist Meetings:   Marland Kitchen Marital Status:   Intimate Partner Violence:   . Fear  of Current or Ex-Partner:   . Emotionally Abused:   Marland Kitchen Physically Abused:   . Sexually Abused:     Review of Systems: See HPI, otherwise negative ROS   Physical Exam: BP 136/67   Pulse 94   Temp 98.1 F (36.7 C) (Oral)   Resp 16   Ht 5' 4"  (1.626 m)   LMP 07/11/2007 (Approximate)   SpO2 95%   BMI 49.61 kg/m  General:   Alert,  pleasant and cooperative in NAD Head:  Normocephalic and atraumatic. Neck:  Supple; Lungs:  Clear throughout to auscultation.    Heart:  Regular rate and rhythm. Abdomen:  Soft, nontender and nondistended. Normal bowel sounds, without guarding, and without rebound.   Neurologic:  Alert and  oriented x4;  grossly normal neurologically.  Impression/Plan:    PERSONAL HISTORY OF POLYPS/DYSPHAGIA.  PLAN: 1. TCS/EGD/DIL TODAY. DISCUSSED PROCEDURE, BENEFITS, & RISKS: < 1% chance of medication reaction, bleeding, perforation, ASPIRATION, or rupture of spleen/liver requiring surgery to fix it and missed polyps < 1 cm 10-20% of the time.

## 2019-10-28 NOTE — Anesthesia Postprocedure Evaluation (Signed)
Anesthesia Post Note  Patient: Yolanda [name]  Procedure(s) Performed: COLONOSCOPY WITH PROPOFOL (N/A ) ESOPHAGOGASTRODUODENOSCOPY (EGD) WITH PROPOFOL (N/A ) SAVORY DILATION (N/A ) POLYPECTOMY BIOPSY  Patient location during evaluation: Phase II Anesthesia Type: General Level of consciousness: awake and alert and patient cooperative Pain management: satisfactory to patient Vital Signs Assessment: post-procedure vital signs reviewed and stable Respiratory status: spontaneous breathing Cardiovascular status: stable Postop Assessment: no apparent nausea or vomiting Anesthetic complications: no     Last Vitals:  Vitals:   10/28/19 1006 10/28/19 1009  BP:  136/72  Pulse: 87   Resp: 18   Temp: 36.6 C   SpO2: 93%     Last Pain:  Vitals:   10/28/19 1006  TempSrc: Oral  PainSc: 0-No pain                 Mia Winthrop

## 2019-10-28 NOTE — Transfer of Care (Signed)
Immediate Anesthesia Transfer of Care Note  Patient: DIANY FORMOSA  Procedure(s) Performed: COLONOSCOPY WITH PROPOFOL (N/A ) ESOPHAGOGASTRODUODENOSCOPY (EGD) WITH PROPOFOL (N/A ) SAVORY DILATION (N/A ) POLYPECTOMY BIOPSY  Patient Location: PACU  Anesthesia Type:General  Level of Consciousness: awake, alert  and oriented  Airway & Oxygen Therapy: Patient Spontanous Breathing  Post-op Assessment: Report given to RN, Post -op Vital signs reviewed and stable and Patient moving all extremities X 4  Post vital signs: Reviewed and stable  Last Vitals:  Vitals Value Taken Time  BP 125/69 10/28/19 0935  Temp    Pulse 88 10/28/19 0938  Resp 20 10/28/19 0938  SpO2 96 % 10/28/19 0938  Vitals shown include unvalidated device data.  Last Pain:  Vitals:   10/28/19 0832  TempSrc:   PainSc: 0-No pain         Complications: No apparent anesthesia complications

## 2019-10-28 NOTE — Discharge Instructions (Signed)
You had eight polyps removed. YOU HAD A SMALL STOMACHNODULE. I STRETCHED YOUR ESOPHAGUS DUE TO YOUR PROBLEMS SWALLOWING. I BIOPSIED YOUR STOMACH.   HOLD ASPIRIN & NAPROXEN. RE-START APR 22. USE TYLENOL AS NEEDED FOR PAIN.  DRINK WATER TO KEEP YOUR URINE LIGHT YELLOW.  CONTINUE YOUR WEIGHT LOSS EFFORTS. YOUR BODY MASS INDEX (BMI) IS OVER 40 WHICH MEANS YOU ARE MORBIDLY OBESE. OBESITY ACTIVATES CANCER GENES. MORBID OBESITY SHORTENS YOUR LIFE EXPECTANCY 10 YEARS. OBESITY IS ASSOCIATED WITH AN INCREASE RISK FOR CIRRHOSIS, COLON POLYPS, AND ALL CANCERS, INCLUDING ESOPHAGEAL AND COLON CANCER.   I RECOMMEND YOU READ AND FOLLOW RECOMMENDATIONS of DR. MARK HYMAN, "10-DAY DETOX DIET".  OTHERWISE, FOLLOW A HIGH FIBER DIET. AVOID ITEMS THAT CAUSE BLOATING. See info below.   MEATS SHOULD BE BAKED, BROILED, OR BOILED. Avoid fried foods. DO NOT EAT FAST FOOD. Do not drink SODA, ENERGY DRINKS OR DIET SODA. AVOID HIGH FRUCTOSE CORN SYRUP. DO NOT chew SUGAR FREE GUM, OR USE ARTIFICIAL SWEETENERS. USE STEVIA AS A SWEETENER.    USE PREPARATION H FOUR TIMES  A DAY IF NEEDED TO RELIEVE RECTAL PAIN/PRESSURE/BLEEDING.\    YOUR BIOPSY RESULTS WILL BE BACK IN 5 BUSINESS DAYS.  Follow up in 4 mos.  Next colonoscopy in 3 years.   ENDOSCOPY Care After Read the instructions outlined below and refer to this sheet in the next week. These discharge instructions provide you with general information on caring for yourself after you leave the hospital. While your treatment has been planned according to the most current medical practices available, unavoidable complications occasionally occur. If you have any problems or questions after discharge, call DR. Batoul Limes, [phone].  ACTIVITY  You may resume your regular activity, but move at a slower pace for the next 24 hours.   Take frequent rest periods for the next 24 hours.   Walking will help get rid of the air and reduce the bloated feeling in your belly  (abdomen).   No driving for 24 hours (because of the medicine (anesthesia) used during the test).   You may shower.   Do not sign any important legal documents or operate any machinery for 24 hours (because of the anesthesia used during the test).    NUTRITION  Drink plenty of fluids.   You may resume your normal diet as instructed by your doctor.   Begin with a light meal and progress to your normal diet. Heavy or fried foods are harder to digest and may make you feel sick to your stomach (nauseated).   Avoid alcoholic beverages for 24 hours or as instructed.    MEDICATIONS  You may resume your normal medications.   WHAT YOU CAN EXPECT TODAY  Some feelings of bloating in the abdomen.   Passage of more gas than usual.   Spotting of blood in your stool or on the toilet paper  .  IF YOU HAD POLYPS REMOVED DURING THE ENDOSCOPY:  Eat a soft diet IF YOU HAVE NAUSEA, BLOATING, ABDOMINAL PAIN, OR VOMITING.    FINDING OUT THE RESULTS OF YOUR TEST Not all test results are available during your visit. DR. [name] WILL CALL YOU WITHIN 14 DAYS OF YOUR PROCEDUE WITH YOUR RESULTS. Do not assume everything is normal if you have not heard from DR. Akyla Vavrek, CALL HER OFFICE AT [phone].  SEEK IMMEDIATE MEDICAL ATTENTION AND CALL THE OFFICE: [phone] IF:  You have more than a spotting of blood in your stool.   Your belly is swollen (abdominal distention).  You are nauseated or vomiting.   You have a temperature over 101F.   You have abdominal pain or discomfort that is severe or gets worse throughout the day.   High-Fiber Diet A high-fiber diet changes your normal diet to include more whole grains, legumes, fruits, and vegetables. Changes in the diet involve replacing refined carbohydrates with unrefined foods. The calorie level of the diet is essentially unchanged. The Dietary Reference Intake (recommended amount) for adult males is 38 grams per day. For adult females, it  is 25 grams per day. Pregnant and lactating women should consume 28 grams of fiber per day. Fiber is the intact part of a plant that is not broken down during digestion. Functional fiber is fiber that has been isolated from the plant to provide a beneficial effect in the body.  PURPOSE Increase stool bulk.  Ease and regulate bowel movements.  Lower cholesterol.  REDUCE RISK OF COLON CANCER  INDICATIONS THAT YOU NEED MORE FIBER Constipation and hemorrhoids.  Uncomplicated diverticulosis (intestine condition) and irritable bowel syndrome.  Weight management.  As a protective measure against hardening of the arteries (atherosclerosis), diabetes, and cancer.   GUIDELINES FOR INCREASING FIBER IN THE DIET Start adding fiber to the diet slowly. A gradual increase of about 5 more grams (2 servings of most fruits or vegetables) per day is best. Too rapid an increase in fiber may result in constipation, flatulence, and bloating.  Drink enough water and fluids to keep your urine clear or pale yellow. Water, juice, or caffeine-free drinks are recommended. Not drinking enough fluid may cause constipation.  Eat a variety of high-fiber foods rather than one type of fiber.  Try to increase your intake of fiber through using high-fiber foods rather than fiber pills or supplements that contain small amounts of fiber.  The goal is to change the types of food eaten. Do not supplement your present diet with high-fiber foods, but replace foods in your present diet.     Polyps, Colon  A polyp is extra tissue that grows inside your body. Colon polyps grow in the large intestine. The large intestine, also called the colon, is part of your digestive system. It is a long, hollow tube at the end of your digestive tract where your body makes and stores stool. Most polyps are not dangerous. They are benign. This means they are not cancerous. But over time, some types of polyps can turn into cancer. Polyps that are smaller  than a pea are usually not harmful. But larger polyps could someday become or may already be cancerous. To be safe, doctors remove all polyps and test them.   PREVENTION There is not one sure way to prevent polyps. You might be able to lower your risk of getting them if you:  Eat more fruits and vegetables and less fatty food.   Do not smoke.   Avoid alcohol.   Exercise every day.   Lose weight if you are overweight.   Eating more calcium and folate can also lower your risk of getting polyps. Some foods that are rich in calcium are milk, cheese, and broccoli. Some foods that are rich in folate are chickpeas, kidney beans, and spinach.    Hiatal Hernia A hiatal hernia occurs when a part of the stomach slides above the diaphragm. The diaphragm is the thin muscle separating the belly (abdomen) from the chest. A hiatal hernia can be something you are born with or develop over time. Hiatal hernias may allow stomach acid  to flow back into your esophagus, the tube which carries food from your mouth to your stomach. If this acid causes problems it is called GERD (gastro-esophageal reflux disease).

## 2019-10-28 NOTE — Op Note (Signed)
[facility] Patient Name: Yolanda [name] Procedure Date: 10/28/2019 8:03 AM MRN: [id number] Date of Birth: [birth year] Attending MD: Barney Drain MD, MD CSN: [id number] Age: 64 Admit Type: Outpatient Procedure:                Colonoscopy WITH COLD SNARE POLYPECTOMY Indications:              Personal history of colonic polyps Providers:                Barney Drain MD, MD, Charlsie Quest. Theda Sers RN, RN,                            Aram Candela Referring MD:             Halford Chessman MD, MD Medicines:                Propofol per Anesthesia Complications:            No immediate complications. Estimated Blood Loss:     Estimated blood loss was minimal. Procedure:                Pre-Anesthesia Assessment:                           - Prior to the procedure, a History and Physical                            was performed, and patient medications and                            allergies were reviewed. The patient's tolerance of                            previous anesthesia was also reviewed. The risks                            and benefits of the procedure and the sedation                            options and risks were discussed with the patient.                            All questions were answered, and informed consent                            was obtained. Prior Anticoagulants: The patient has                            taken no previous anticoagulant or antiplatelet                            agents except for aspirin. ASA Grade Assessment:                            III - A patient with severe systemic disease. After  reviewing the risks and benefits, the patient was                            deemed in satisfactory condition to undergo the                            procedure. After obtaining informed consent, the                            colonoscope was passed under direct vision.                            Throughout the procedure, the patient's  blood                            pressure, pulse, and oxygen saturations were                            monitored continuously. The PCF-H190DL ([id number])                            scope was introduced through the anus and advanced                            to the the cecum, identified by appendiceal orifice                            and ileocecal valve. The colonoscopy was somewhat                            difficult due to a tortuous colon. Successful                            completion of the procedure was aided by                            straightening and shortening the scope to obtain                            bowel loop reduction and COLOWRAP. The patient                            tolerated the procedure well. The quality of the                            bowel preparation was good. The ileocecal valve,                            appendiceal orifice, and rectum were photographed. Scope In: 8:42:05 AM Scope Out: 9:10:18 AM Scope Withdrawal Time: 0 hours 26 minutes 12 seconds  Total Procedure Duration: 0 hours 28 minutes 13 seconds  Findings:      Eight sessile polyps were found in the rectum(2), sigmoid colon, hepatic  flexure(4) and ascending colon. The polyps were 2 to 5 mm in size. These       polyps were removed with a cold snare. Resection and retrieval were       complete.      External and internal hemorrhoids were found.      The recto-sigmoid colon and sigmoid colon were mildly tortuous. Impression:               - Eight 2 to 5 mm polyps in the rectum, in the                            sigmoid colon, at the hepatic flexure and in the                            ascending colon, removed with a cold snare.                            Resected and retrieved.                           - External and internal hemorrhoids.                           - Tortuous colon. Moderate Sedation:      Per Anesthesia Care Recommendation:           - Patient has a contact  number available for                            emergencies. The signs and symptoms of potential                            delayed complications were discussed with the                            patient. Return to normal activities tomorrow.                            Written discharge instructions were provided to the                            patient.                           - High fiber diet. LOSE WEIGHT.                           - Continue present medications. HOLD ASA/NAPROXEN                            UNTIL APR 22.                           - Await pathology results.                           - Repeat colonoscopy in 3 years for surveillance.  Procedure Code(s):        --- Professional ---                           [phone], Colonoscopy, flexible; with removal of                            tumor(s), polyp(s), or other lesion(s) by snare                            technique Diagnosis Code(s):        --- Professional ---                           K62.1, Rectal polyp                           K63.5, Polyp of colon                           K64.8, Other hemorrhoids                           Z86.010, Personal history of colonic polyps                           Q43.8, Other specified congenital malformations of                            intestine CPT copyright 2019 American Medical Association. All rights reserved. The codes documented in this report are preliminary and upon coder review may  be revised to meet current compliance requirements. Barney Drain, MD Barney Drain MD, MD 10/28/2019 9:56:03 AM This report has been signed electronically. Number of Addenda: 0

## 2019-10-28 NOTE — Op Note (Signed)
[facility] Patient Name: Yolanda [name] Procedure Date: 10/28/2019 9:13 AM MRN: [id number] Date of Birth: 1956/06/18 Attending MD: Barney Drain MD, MD CSN: [id number] Age: 64 Admit Type: Outpatient Procedure:                Upper GI endoscopy WITH COLD FORCEPS                            BIOPSY/ESOPHAGEAL DILATION Indications:              Dysphagia Providers:                Barney Drain MD, MD, Selena Lesser RN, RN, Aram Candela Referring MD:             Halford Chessman MD, MD Medicines:                Propofol per Anesthesia Complications:            No immediate complications. Estimated Blood Loss:     Estimated blood loss was minimal. Procedure:                Pre-Anesthesia Assessment:                           - Prior to the procedure, a History and Physical                            was performed, and patient medications and                            allergies were reviewed. The patient's tolerance of                            previous anesthesia was also reviewed. The risks                            and benefits of the procedure and the sedation                            options and risks were discussed with the patient.                            All questions were answered, and informed consent                            was obtained. Prior Anticoagulants: The patient has                            taken no previous anticoagulant or antiplatelet                            agents except for aspirin. ASA Grade Assessment:  III - A patient with severe systemic disease. After                            reviewing the risks and benefits, the patient was                            deemed in satisfactory condition to undergo the                            procedure. After obtaining informed consent, the                            endoscope was passed under direct vision.                            Throughout the  procedure, the patient's blood                            pressure, pulse, and oxygen saturations were                            monitored continuously. The GIF-H190 ([id number])                            scope was introduced through the mouth, and                            advanced to the second part of duodenum. The upper                            GI endoscopy was accomplished without difficulty.                            The patient tolerated the procedure well. Scope In: 9:15:37 AM Scope Out: 9:25:59 AM Total Procedure Duration: 0 hours 10 minutes 22 seconds  Findings:      No endoscopic abnormality was evident in the esophagus to explain the       patient's complaint of dysphagia. It was decided, however, to proceed       with dilation of the entire esophagus DUE TO POSSIBLE OCCULT ESOPHAGEAL       WEB. A guidewire was placed and the scope was withdrawn. Dilation was       performed with a Savary dilator with mild resistance at 16 mm and 17 mm.      A single 6 mm mucosal papule (nodule) with no bleeding and no stigmata       of recent bleeding was found in the cardia. Biopsies were taken with a       cold forceps for histology.      Localized mild inflammation characterized by congestion (edema) and       erythema was found in the gastric antrum.      Mild portal hypertensive gastropathy was found in the gastric fundus and       in the gastric body.      The examined duodenum was normal. Impression:               -  No endoscopic esophageal abnormality to explain                            patient's dysphagia. Esophagus dilated. Dilated.                           - A single mucosal papule (nodule) found in the                            stomach. Biopsied.                           - MILD Gastritis.                           - MILD Portal hypertensive gastropathy. Moderate Sedation:      Per Anesthesia Care Recommendation:           - Patient has a contact number available for                             emergencies. The signs and symptoms of potential                            delayed complications were discussed with the                            patient. Return to normal activities tomorrow.                            Written discharge instructions were provided to the                            patient.                           - Resume previous diet. CONTINUE WEIGHT LOSS                            EFFORTS.                           - Continue present medications.                           - Await pathology results.                           - Repeat upper endoscopy in 3 years for                            surveillance.                           - Return to GI office in 6 months. Procedure Code(s):        --- Professional ---  [phone], Esophagogastroduodenoscopy, flexible,                            transoral; with insertion of guide wire followed by                            passage of dilator(s) through esophagus over guide                            wire                           [id number], 59, Esophagogastroduodenoscopy, flexible,                            transoral; with biopsy, single or multiple Diagnosis Code(s):        --- Professional ---                           R13.10, Dysphagia, unspecified                           K31.89, Other diseases of stomach and duodenum                           K29.70, Gastritis, unspecified, without bleeding                           K76.6, Portal hypertension CPT copyright 2019 American Medical Association. All rights reserved. The codes documented in this report are preliminary and upon coder review may  be revised to meet current compliance requirements. Barney Drain, MD Barney Drain MD, MD 10/28/2019 11:14:09 AM This report has been signed electronically. Number of Addenda: 0

## 2019-10-28 NOTE — Anesthesia Preprocedure Evaluation (Signed)
Anesthesia Evaluation  Patient identified by MRN, date of birth, ID band Patient awake    Reviewed: Allergy & Precautions, H&P , NPO status , Patient's Chart, lab work & pertinent test results, reviewed documented beta blocker date and time   History of Anesthesia Complications (+) PONV, Family history of anesthesia reaction and history of anesthetic complications  Airway Mallampati: II  TM Distance: >3 FB Neck ROM: full    Dental no notable dental hx. (+) Teeth Intact   Pulmonary sleep apnea , former smoker,    Pulmonary exam normal breath sounds clear to auscultation       Cardiovascular Exercise Tolerance: Good hypertension, negative cardio ROS   Rhythm:regular Rate:Normal     Neuro/Psych PSYCHIATRIC DISORDERS Anxiety Depression  Neuromuscular disease    GI/Hepatic GERD  Medicated,(+) Cirrhosis       ,   Endo/Other  Hypothyroidism Hyperthyroidism   Renal/GU Renal disease  negative genitourinary   Musculoskeletal   Abdominal   Peds  Hematology negative hematology ROS (+)   Anesthesia Other Findings   Reproductive/Obstetrics negative OB ROS                             Anesthesia Physical Anesthesia Plan  ASA: III  Anesthesia Plan: General   Post-op Pain Management:    Induction:   PONV Risk Score and Plan: 3 and Propofol infusion  Airway Management Planned:   Additional Equipment:   Intra-op Plan:   Post-operative Plan:   Informed Consent: I have reviewed the patients History and Physical, chart, labs and discussed the procedure including the risks, benefits and alternatives for the proposed anesthesia with the patient or authorized representative who has indicated his/her understanding and acceptance.     Dental Advisory Given  Plan Discussed with: CRNA  Anesthesia Plan Comments:         Anesthesia Quick Evaluation

## 2019-11-04 ENCOUNTER — Ambulatory Visit: Payer: Medicare Other | Attending: Internal Medicine

## 2019-11-04 DIAGNOSIS — Z23 Encounter for immunization: Secondary | ICD-10-CM

## 2019-11-04 NOTE — Progress Notes (Signed)
   Covid-19 Vaccination Clinic  Name:  MERELIN HUMAN    MRN: [id number] DOB: 03/27/1956  11/04/2019  Ms. Arena was observed post Covid-19 immunization for 30 minutes based on pre-vaccination screening without incident. She was provided with Vaccine Information Sheet and instruction to access the V-Safe system.   Ms. Schaaf was instructed to call 911 with any severe reactions post vaccine: Marland Kitchen Difficulty breathing  . Swelling of face and throat  . A fast heartbeat  . A bad rash all over body  . Dizziness and weakness   Immunizations Administered    Name Date Dose VIS Date Route   Pfizer COVID-19 Vaccine 11/04/2019 11:28 AM 0.3 mL 09/03/2018 Intramuscular   Manufacturer: [place]   Lot: [id number]   [place]: [id number]

## 2019-11-08 ENCOUNTER — Emergency Department (HOSPITAL_COMMUNITY): Payer: Medicare Other

## 2019-11-08 ENCOUNTER — Encounter (HOSPITAL_COMMUNITY): Payer: Self-pay | Admitting: *Deleted

## 2019-11-08 ENCOUNTER — Emergency Department (HOSPITAL_COMMUNITY)
Admission: EM | Admit: 2019-11-08 | Discharge: 2019-11-09 | Disposition: A | Discharge status: Home / Self Care | Payer: Medicare Other | Attending: Emergency Medicine | Admitting: Emergency Medicine

## 2019-11-08 DIAGNOSIS — E039 Hypothyroidism, unspecified: Secondary | ICD-10-CM | POA: Insufficient documentation

## 2019-11-08 DIAGNOSIS — Z87891 Personal history of nicotine dependence: Secondary | ICD-10-CM | POA: Diagnosis not present

## 2019-11-08 DIAGNOSIS — Z86711 Personal history of pulmonary embolism: Secondary | ICD-10-CM | POA: Diagnosis not present

## 2019-11-08 DIAGNOSIS — M797 Fibromyalgia: Secondary | ICD-10-CM | POA: Insufficient documentation

## 2019-11-08 DIAGNOSIS — I10 Essential (primary) hypertension: Secondary | ICD-10-CM | POA: Diagnosis not present

## 2019-11-08 DIAGNOSIS — R072 Precordial pain: Secondary | ICD-10-CM

## 2019-11-08 DIAGNOSIS — R079 Chest pain, unspecified: Secondary | ICD-10-CM | POA: Diagnosis present

## 2019-11-08 LAB — BASIC METABOLIC PANEL
Anion gap: 8 (ref 5–15)
BUN: 12 mg/dL (ref 8–23)
CO2: 29 mmol/L (ref 22–32)
Calcium: 8.8 mg/dL — ABNORMAL LOW (ref 8.9–10.3)
Chloride: 101 mmol/L (ref 98–111)
Creatinine, Ser: 0.57 mg/dL (ref 0.44–1.00)
GFR calc Af Amer: >60 mL/min (ref >60)
GFR calc non Af Amer: >60 mL/min (ref >60)
Glucose, Bld: 107 mg/dL — ABNORMAL HIGH (ref 70–99)
Potassium: 3.8 mmol/L (ref 3.5–5.1)
Sodium: 138 mmol/L (ref 135–145)

## 2019-11-08 LAB — CBC
HCT: 47.2 % — ABNORMAL HIGH (ref 36.0–46.0)
Hemoglobin: 15.2 g/dL — ABNORMAL HIGH (ref 12.0–15.0)
MCH: 31.6 pg (ref 26.0–34.0)
MCHC: 32.2 g/dL (ref 30.0–36.0)
MCV: 98.1 fL (ref 80.0–100.0)
Platelets: 162 10*3/uL (ref 150–400)
RBC: 4.81 MIL/uL (ref 3.87–5.11)
RDW: 12.9 % (ref 11.5–15.5)
WBC: 7.6 10*3/uL (ref 4.0–10.5)
nRBC: 0 % (ref 0.0–0.2)

## 2019-11-08 LAB — HEPATIC FUNCTION PANEL
ALT: 40 U/L (ref 0–44)
AST: 51 U/L — ABNORMAL HIGH (ref 15–41)
Albumin: 3.4 g/dL — ABNORMAL LOW (ref 3.5–5.0)
Alkaline Phosphatase: 110 U/L (ref 38–126)
Bilirubin, Direct: 0.1 mg/dL (ref 0.0–0.2)
Indirect Bilirubin: 0.5 mg/dL (ref 0.3–0.9)
Total Bilirubin: 0.6 mg/dL (ref 0.3–1.2)
Total Protein: 7.5 g/dL (ref 6.5–8.1)

## 2019-11-08 LAB — LIPASE, BLOOD: Lipase: 53 U/L — ABNORMAL HIGH (ref 11–51)

## 2019-11-08 LAB — D-DIMER, QUANTITATIVE: D-Dimer, Quant: 0.93 ug/mL-FEU — ABNORMAL HIGH (ref 0.00–0.50)

## 2019-11-08 LAB — TROPONIN I (HIGH SENSITIVITY)
Troponin I (High Sensitivity): 3 ng/L (ref <18)
Troponin I (High Sensitivity): 3 ng/L (ref <18)

## 2019-11-08 MED ORDER — ALBUTEROL SULFATE HFA 108 (90 BASE) MCG/ACT IN AERS: 1.0000 | INHALATION_SPRAY | Freq: Four times a day (QID) | RESPIRATORY_TRACT | 0 refills | Status: AC | PRN

## 2019-11-08 MED ORDER — DEXAMETHASONE SODIUM PHOSPHATE 10 MG/ML IJ SOLN
10.0000 mg | Freq: Once | INTRAMUSCULAR | Status: AC
  Administered 2019-11-08: 10 mg via INTRAVENOUS
  Filled 2019-11-08: qty 1

## 2019-11-08 MED ORDER — ONDANSETRON 4 MG PO TBDP
4.0000 mg | ORAL_TABLET | Freq: Once | ORAL | Status: AC
  Administered 2019-11-08: 22:00:00 4 mg via ORAL
  Filled 2019-11-08: qty 1

## 2019-11-08 MED ORDER — IOHEXOL 350 MG/ML SOLN
100.0000 mL | Freq: Once | INTRAVENOUS | Status: AC | PRN
  Administered 2019-11-08: 100 mL via INTRAVENOUS

## 2019-11-08 MED ORDER — DOXYCYCLINE HYCLATE 100 MG PO TABS
100.0000 mg | ORAL_TABLET | Freq: Once | ORAL | Status: AC
  Administered 2019-11-08: 100 mg via ORAL
  Filled 2019-11-08: qty 1

## 2019-11-08 MED ORDER — ALBUTEROL SULFATE HFA 108 (90 BASE) MCG/ACT IN AERS
2.0000 | INHALATION_SPRAY | Freq: Once | RESPIRATORY_TRACT | Status: AC
  Administered 2019-11-08: 2 via RESPIRATORY_TRACT
  Filled 2019-11-08: qty 6.7

## 2019-11-08 MED ORDER — ALUM & MAG HYDROXIDE-SIMETH 200-200-20 MG/5ML PO SUSP
15.0000 mL | Freq: Once | ORAL | Status: AC
  Administered 2019-11-08: 22:00:00 15 mL via ORAL
  Filled 2019-11-08: qty 30

## 2019-11-08 MED ORDER — DOXYCYCLINE HYCLATE 100 MG PO CAPS
100.0000 mg | ORAL_CAPSULE | Freq: Two times a day (BID) | ORAL | 0 refills | Status: AC
Start: 2019-11-08 — End: 2019-11-15

## 2019-11-08 NOTE — Discharge Instructions (Signed)
You were seen in the emergency room today with chest discomfort.  Your lab work showed elevated blood clot labs which your CT scan did not show a pulmonary embolism.  You do have some changes consistent with bronchitis which correlates well with some of your wheezing and mild cough.  I am starting you on antibiotic and have sent you home with an inhaler.  You were given steroid in the emergency department which will last for several days.  Return to the emergency department any new or suddenly worsening symptoms.

## 2019-11-08 NOTE — ED Triage Notes (Signed)
Chest pain since 3pm today

## 2019-11-08 NOTE — ED Notes (Signed)
Reports after eating tuna salad , R sided CP then across chest thru to the back   Pt reports she still has er Gall bladder   She reports that "I was just sitting there" when she noticed the pain   Pt is non smoker  Second trop has been drawn

## 2019-11-08 NOTE — ED Provider Notes (Signed)
Emergency Department Provider Note   I have reviewed the triage vital signs and the nursing notes.   HISTORY  Chief Complaint Chest Pain   HPI Yolanda [name] is a 64 y.o. female with PMH of HTN, HLD, elevated BMI, and Fibromyalgia presents to the emergency department for evaluation of acute onset chest discomfort starting today at approximately 3 PM.  Patient describes a deep type feeling in the anterior chest as well as her back.  The pain stretches across her chest in a bandlike distribution.  She describes some associated shortness of breath.  No clear modifying factors for the chest discomfort.  She does endorse some occasional sharp type pain in the center to right side of her chest but this seems more random and not reproducible.  She does have a remote history of PE.  She tells me she was on blood thinner for approximately 1 year and then told she could come off of it.  She has not experienced any leg pain or swelling recently.  No fever, cough, vomiting, or diarrhea. No similar pain in the past.   Past Medical History:  Diagnosis Date  . Abnormal uterine bleeding   . Anxiety   . Arthritis   . Calculus of kidney 07/09/2008   Qualifier: Diagnosis of  By: Kellie Simmering LPN, Almyra Free    . Chronic abdominal pain   . Chronic pain in left foot   . Clotting disorder ([place])   . Depression   . Elevated liver function tests 2018  . Endometriosis   . Family history of adverse reaction to anesthesia   . Fatty liver   . Fibromyalgia   . Gastritis   . GERD 12/21/2009   Qualifier: Diagnosis of  By: Craige Cotta    . History of kidney stones   . Hypertension    stopped meds in Aug 2015  . Hypothyroidism   . IBS (irritable bowel syndrome)   . Internal hemorrhoids   . Kidney stone   . Obesity, morbid ([place]) 09/11/2012  . PE 07/09/2008   Qualifier: Diagnosis of  By: Kellie Simmering LPN, Almyra Free    . PE (pulmonary embolism)   . PONV (postoperative nausea and vomiting)   . Sleep apnea   . Thyroid  disease   . Vitamin D deficiency     Patient Active Problem List   Diagnosis Date Noted  . Gastric nodule   . Personal history of colonic polyps 01/01/2019  . Liver cirrhosis secondary to NASH ([place]) 01/22/2018  . Dysphagia 04/19/2017  . Abdominal pain, epigastric 04/19/2017  . Abdominal pain 12/08/2014  . Postmenopausal bleeding 11/18/2014  . Closed fracture of distal clavicle 04/17/2014  . Muscle weakness (generalized) 03/26/2014  . Pain in joint, shoulder region 03/26/2014  . Decreased range of motion of right shoulder 03/26/2014  . Right clavicle fracture 03/19/2014  . Fatty liver 03/17/2014  . Rectal bleeding 03/17/2014  . Dyspepsia 03/11/2014  . Hypothyroidism 09/11/2012  . ADHD (attention deficit hyperactivity disorder) 09/11/2012  . Anxiety 09/11/2012  . GERD 12/21/2009  . IRRITABLE BOWEL SYNDROME 04/22/2009  . HYPERLIPIDEMIA 07/09/2008  . DEPRESSION 07/09/2008  . HYPERTENSION 07/09/2008  . Gastritis and gastroduodenitis 07/09/2008  . SLEEP APNEA 07/09/2008    Past Surgical History:  Procedure Laterality Date  . ABDOMINAL SURGERY     laparoscopy  . BIOPSY N/A 03/24/2014   Procedure: GASTRIC BIOPSIES;  Surgeon: Danie Binder, MD;  Location: [facility];  Service: Endoscopy;  Laterality: N/A;  . BIOPSY  10/28/2019  Procedure: BIOPSY;  Surgeon: Danie Binder, MD;  Location: [facility];  Service: Endoscopy;;  gastric nodule  . BREAST LUMPECTOMY Right   . CESAREAN SECTION     X2  . COLONOSCOPY  2006   internal hemorrhoids  . COLONOSCOPY WITH PROPOFOL N/A 03/24/2014   Dr. [name]: 2 tubular adenomas removed, hemorrhoids  . COLONOSCOPY WITH PROPOFOL N/A 10/28/2019   Procedure: COLONOSCOPY WITH PROPOFOL;  Surgeon: Danie Binder, MD;  Location: [facility];  Service: Endoscopy;  Laterality: N/A;  8:30am  . CYSTOSCOPY W/ RETROGRADES  01/23/2012   Procedure: CYSTOSCOPY WITH RETROGRADE PYELOGRAM;  Surgeon: Marissa Nestle, MD;  Location: [facility];  Service: Urology;   Laterality: Left;  . DILATATION & CURETTAGE/HYSTEROSCOPY WITH MYOSURE N/A 11/18/2014   Procedure: DILATATION & CURETTAGE/HYSTEROSCOPY WITH MYOSURE, resection of polyp;  Surgeon: Cheri Fowler, MD;  Location: [facility];  Service: Gynecology;  Laterality: N/A;  . DILATION AND CURETTAGE OF UTERUS    . ESOPHAGOGASTRODUODENOSCOPY   08/24/2006   Dr. Veto Kemps erythema of the antrum without erosion or ulcers/Otherwise, normal esophagus without evidence of Barrett's path with chronic gastritis  . ESOPHAGOGASTRODUODENOSCOPY (EGD) WITH PROPOFOL N/A 03/24/2014   Dr. [name]: gastritis  . ESOPHAGOGASTRODUODENOSCOPY (EGD) WITH PROPOFOL N/A 10/28/2019   Procedure: ESOPHAGOGASTRODUODENOSCOPY (EGD) WITH PROPOFOL;  Surgeon: Danie Binder, MD;  [facility];  Service: Endoscopy;  Laterality: N/A;  . POLYPECTOMY N/A 03/24/2014   Procedure: POLYPECTOMY;  Surgeon: Danie Binder, MD;  Location: [facility];  Service: Endoscopy;  Laterality: N/A;  . POLYPECTOMY  10/28/2019   Procedure: POLYPECTOMY;  Surgeon: Danie Binder, MD;  [facility];  Service: Endoscopy;;  hepatic flexure, ascending colon,sigmoid colon, rectal  . REMOVAL OF STONES  01/23/2012   Procedure: REMOVAL OF STONES;  Surgeon: Marissa Nestle, MD;  Location: [facility];  Service: Urology;  Laterality: N/A;  . SAVORY DILATION N/A 10/28/2019   Procedure: SAVORY DILATION;  Surgeon: Danie Binder, MD;  [facility];  Service: Endoscopy;  Laterality: N/A;  . TUBAL LIGATION      Allergies Azithromycin, Lisinopril, and Sulfonamide derivatives  Family History  Problem Relation Age of Onset  . Heart attack Mother        CABG  . Stroke Mother   . Hypertension Mother   . Cancer Mother   . Diabetes Mother   . Heart attack Father        CABG  . Hypertension Father   . Mesothelioma Father   . Heart attack Brother   . Diabetes Brother   . Depression Brother   . Hyperlipidemia Brother   . Diabetes Maternal Grandmother   .  Diabetes Maternal Grandfather   . Cancer Paternal Grandmother   . Diabetes Brother   . Hyperlipidemia Brother   . Wilson's disease Other        2 nieces, one died in her 16s  . Colon cancer Neg Hx     Social History Social History   Tobacco Use  . Smoking status: Former Smoker    Years: 20.00    Types: Cigarettes    Quit date: 10/24/1998    Years since quitting: 21.0  . Smokeless tobacco: Never Used  . Tobacco comment: 1 cig daily  Substance Use Topics  . Alcohol use: Not Currently    Comment: "no alcohol in years"  . Drug use: No    Review of Systems  Constitutional: No fever/chills Eyes: No visual changes. ENT: No sore  throat. Cardiovascular: Positive chest pain. Respiratory: Positive shortness of breath. Gastrointestinal: No abdominal pain. Positive nausea, no vomiting.  No diarrhea.  No constipation. Genitourinary: Negative for dysuria. Musculoskeletal: Negative for back pain. Skin: Negative for rash. Neurological: Negative for headaches, focal weakness or numbness.  10-point ROS otherwise negative.  ____________________________________________   PHYSICAL EXAM:  VITAL SIGNS: ED Triage Vitals [11/08/19 1859]  Enc Vitals Group     BP (!) 156/124     Pulse Rate 96     Resp (!) 24     Temp 98.7 F (37.1 C)     Temp src      SpO2 97 %     Weight 285 lb (129.3 kg)     Height 5' 4"  (1.626 m)   Constitutional: Alert and oriented. Well appearing and in no acute distress. Eyes: Conjunctivae are normal.  Head: Atraumatic. Nose: No congestion/rhinnorhea. Mouth/Throat: Mucous membranes are moist.   Neck: No stridor.  Cardiovascular: Normal rate, regular rhythm. Good peripheral circulation. Grossly normal heart sounds.   Respiratory: Normal respiratory effort.  No retractions. Lungs CTAB. Gastrointestinal: Soft and nontender. No distention.  Musculoskeletal: No gross deformities of extremities. Neurologic:  Normal speech and language.  Skin:  Skin is warm,  dry and intact. No rash noted.   ____________________________________________   LABS (all labs ordered are listed, but only abnormal results are displayed)  Labs Reviewed  BASIC METABOLIC PANEL - Abnormal; Notable for the following components:      Result Value   Glucose, Bld 107 (*)    Calcium 8.8 (*)    All other components within normal limits  CBC - Abnormal; Notable for the following components:   Hemoglobin 15.2 (*)    HCT 47.2 (*)    All other components within normal limits  HEPATIC FUNCTION PANEL - Abnormal; Notable for the following components:   Albumin 3.4 (*)    AST 51 (*)    All other components within normal limits  LIPASE, BLOOD - Abnormal; Notable for the following components:   Lipase 53 (*)    All other components within normal limits  D-DIMER, QUANTITATIVE (NOT AT [facility]) - Abnormal; Notable for the following components:   D-Dimer, Quant 0.93 (*)    All other components within normal limits  TROPONIN I (HIGH SENSITIVITY)  TROPONIN I (HIGH SENSITIVITY)   ____________________________________________  EKG   EKG Interpretation  Date/Time:  Saturday Nov 08 2019 18:52:08 EDT Ventricular Rate:  92 PR Interval:  138 QRS Duration: 72 QT Interval:  344 QTC Calculation: 425 R Axis:   30 Text Interpretation: Normal sinus rhythm Low voltage QRS Cannot rule out Anterior infarct , age undetermined Abnormal ECG No STEMI Confirmed by Nanda Quinton [phone]) on 11/08/2019 9:12:20 PM       ____________________________________________  RADIOLOGY  DG Chest 2 View  Result Date: 11/08/2019 CLINICAL DATA:  Chest pain. EXAM: CHEST - 2 VIEW COMPARISON:  July 09, 2015 FINDINGS: Mild, stable diffuse chronic appearing increased lung markings are seen. There is no evidence of acute infiltrate, pleural effusion or pneumothorax. The heart size and mediastinal contours are within normal limits. Multilevel degenerative changes seen throughout the thoracic spine. IMPRESSION: No  active cardiopulmonary disease. Electronically Signed   By: Virgina Norfolk M.D.   On: 11/08/2019 19:36    ____________________________________________   PROCEDURES  Procedure(s) performed:   Procedures  None  ____________________________________________   INITIAL IMPRESSION / ASSESSMENT AND PLAN / ED COURSE  Pertinent labs & imaging results that were available  during my care of the patient were reviewed by me and considered in my medical decision making (see chart for details).   Patient presents to the emergency department for evaluation of acute onset chest pain as described above.  Patient has several risk factors for ACS but non-ischemic EKG. HEART score 5. Lower suspicion for PE given history and vitals but patient does have this remote history and is no longer anticoagulated. Initial troponin is 3. I have added on a repeat troponin, d-dimer, and LFTs with lipase. Will give maalox and zofran here and reassess after additional labs.   10:50 PM  Repeat troponin is normal.  Patient's lipase is very mildly elevated with normal LFTs and bilirubin.  Patient's D-dimer is elevated at 0.93.  Plan for CTA in the setting.  I updated the patient and she is feeling some mild relief after Maalox but continues to have some discomfort.   No PE on CTA. Plan for bronchitis treatment and close PCP follow up. Discussed ED return precautions.  ____________________________________________  FINAL CLINICAL IMPRESSION(S) / ED DIAGNOSES  Final diagnoses:  Precordial chest pain     MEDICATIONS GIVEN DURING THIS VISIT:  Medications  alum & mag hydroxide-simeth (MAALOX/MYLANTA) 200-200-20 MG/5ML suspension 15 mL (15 mLs Oral Given 11/08/19 2203)  ondansetron (ZOFRAN-ODT) disintegrating tablet 4 mg (4 mg Oral Given 11/08/19 2202)  iohexol (OMNIPAQUE) 350 MG/ML injection 100 mL (100 mLs Intravenous Contrast Given 11/08/19 2300)  albuterol (VENTOLIN HFA) 108 (90 Base) MCG/ACT inhaler 2 puff (2 puffs  Inhalation Given 11/08/19 2354)  doxycycline (VIBRA-TABS) tablet 100 mg (100 mg Oral Given 11/08/19 2354)  dexamethasone (DECADRON) injection 10 mg (10 mg Intravenous Given 11/08/19 2354)     NEW OUTPATIENT MEDICATIONS STARTED DURING THIS VISIT:  Discharge Medication List as of 11/08/2019 11:34 PM    START taking these medications   Details  albuterol (VENTOLIN HFA) 108 (90 Base) MCG/ACT inhaler Inhale 1-2 puffs into the lungs every 6 (six) hours as needed for wheezing or shortness of breath., Starting Sat 11/08/2019, Normal    doxycycline (VIBRAMYCIN) 100 MG capsule Take 1 capsule (100 mg total) by mouth 2 (two) times daily for 7 days., Starting Sat 11/08/2019, Until Sat 11/15/2019, Normal        Note:  This document was prepared using Dragon voice recognition software and may include unintentional dictation errors.  Nanda Quinton, MD, [facility] Emergency Medicine    Tekila Caillouet, [facility], MD 11/11/19 1254

## 2019-11-10 LAB — SURGICAL PATHOLOGY

## 2019-11-12 ENCOUNTER — Encounter: Payer: Self-pay | Admitting: Emergency Medicine

## 2019-11-12 ENCOUNTER — Other Ambulatory Visit: Payer: Self-pay | Admitting: Emergency Medicine

## 2019-11-12 DIAGNOSIS — Z79899 Other long term (current) drug therapy: Secondary | ICD-10-CM

## 2019-11-12 DIAGNOSIS — K7581 Nonalcoholic steatohepatitis (NASH): Secondary | ICD-10-CM

## 2019-12-01 ENCOUNTER — Other Ambulatory Visit: Payer: Self-pay

## 2019-12-02 ENCOUNTER — Telehealth: Payer: Self-pay | Admitting: Gastroenterology

## 2019-12-02 ENCOUNTER — Telehealth: Payer: Self-pay | Admitting: Internal Medicine

## 2019-12-02 ENCOUNTER — Telehealth: Payer: Self-pay | Admitting: *Deleted

## 2019-12-02 LAB — CBC WITH DIFFERENTIAL/PLATELET
Basophils Absolute: 0.1 10*3/uL (ref 0.0–0.2)
Basos: 1 %
EOS (ABSOLUTE): 0.2 10*3/uL (ref 0.0–0.4)
Eos: 2 %
Hematocrit: 45.9 % (ref 34.0–46.6)
Hemoglobin: 15.7 g/dL (ref 11.1–15.9)
Immature Grans (Abs): 0 10*3/uL (ref 0.0–0.1)
Immature Granulocytes: 0 %
Lymphocytes Absolute: 2.9 10*3/uL (ref 0.7–3.1)
Lymphs: 35 %
MCH: 31.6 pg (ref 26.6–33.0)
MCHC: 34.2 g/dL (ref 31.5–35.7)
MCV: 92 fL (ref 79–97)
Monocytes Absolute: 0.6 10*3/uL (ref 0.1–0.9)
Monocytes: 7 %
Neutrophils Absolute: 4.5 10*3/uL (ref 1.4–7.0)
Neutrophils: 55 %
Platelets: 195 10*3/uL (ref 150–450)
RBC: 4.97 x10E6/uL (ref 3.77–5.28)
RDW: 12.8 % (ref 11.7–15.4)
WBC: 8.1 10*3/uL (ref 3.4–10.8)

## 2019-12-02 LAB — COMPREHENSIVE METABOLIC PANEL
ALT: 45 IU/L — ABNORMAL HIGH (ref 0–32)
AST: 57 IU/L — ABNORMAL HIGH (ref 0–40)
Albumin/Globulin Ratio: 0.9 — ABNORMAL LOW (ref 1.2–2.2)
Albumin: 3.5 g/dL — ABNORMAL LOW (ref 3.8–4.8)
Alkaline Phosphatase: 130 IU/L — ABNORMAL HIGH (ref 48–121)
BUN/Creatinine Ratio: 21 (ref 12–28)
BUN: 13 mg/dL (ref 8–27)
Bilirubin Total: 0.9 mg/dL (ref 0.0–1.2)
CO2: 26 mmol/L (ref 20–29)
Calcium: 9.9 mg/dL (ref 8.7–10.3)
Chloride: 96 mmol/L (ref 96–106)
Creatinine, Ser: 0.61 mg/dL (ref 0.57–1.00)
GFR calc Af Amer: 110 mL/min/{1.73_m2} (ref >59)
GFR calc non Af Amer: 95 mL/min/{1.73_m2} (ref >59)
Globulin, Total: 3.8 g/dL (ref 1.5–4.5)
Glucose: 140 mg/dL — ABNORMAL HIGH (ref 65–99)
Potassium: 4.3 mmol/L (ref 3.5–5.2)
Sodium: 144 mmol/L (ref 134–144)
Total Protein: 7.3 g/dL (ref 6.0–8.5)

## 2019-12-02 LAB — PROTIME-INR
INR: 1.1 (ref 0.9–1.2)
Prothrombin Time: 11.5 s (ref 9.1–12.0)

## 2019-12-02 NOTE — Telephone Encounter (Signed)
Called pt verified name and dob Notified pt of results and recommendations   And to keep ov in July Pt stated she understood and that she would

## 2019-12-02 NOTE — Telephone Encounter (Signed)
Called lmom

## 2019-12-02 NOTE — Telephone Encounter (Signed)
Recall for ultrasound 

## 2019-12-02 NOTE — Telephone Encounter (Signed)
SLF ordered labs. Placed in [place] folder to review since she was last APP to see patient in 2019

## 2019-12-02 NOTE — Telephone Encounter (Signed)
Recall sent 

## 2019-12-02 NOTE — Telephone Encounter (Signed)
Reviewed labs ordered by Dr. [name] dated 12/01/2019 White blood cell count 8100, hemoglobin 15.7, platelets 195,000, creatinine 0.61, albumin 3.5, total bilirubin 0.9, alk phos 130, AST 57, ALT 45, INR 1.1.  Noted to have slight increase in alk phos, AST/ALT from few weeks ago.  Overall stable.  Meld sodium of 7.  Recommend keeping upcoming office visit in July.

## 2019-12-02 NOTE — Telephone Encounter (Signed)
See previous note

## 2019-12-02 NOTE — Telephone Encounter (Signed)
Pt was returning a call from [place]. [phone]

## 2019-12-12 ENCOUNTER — Telehealth: Payer: Self-pay | Admitting: Gastroenterology

## 2019-12-12 DIAGNOSIS — K7460 Unspecified cirrhosis of liver: Secondary | ICD-10-CM

## 2019-12-12 NOTE — Telephone Encounter (Signed)
appt letter mailed with [place] information

## 2019-12-12 NOTE — Addendum Note (Signed)
Addended by: Cheron Every on: 12/12/2019 11:39 AM   Modules accepted: Orders

## 2019-12-12 NOTE — Telephone Encounter (Signed)
Patient received letter to schedule her ultrasound

## 2020-01-19 ENCOUNTER — Other Ambulatory Visit: Payer: Self-pay

## 2020-01-19 ENCOUNTER — Ambulatory Visit (HOSPITAL_COMMUNITY)
Admission: RE | Admit: 2020-01-19 | Discharge: 2020-01-19 | Disposition: A | Discharge status: Home / Self Care | Payer: Medicare Other | Source: Ambulatory Visit | Attending: Gastroenterology | Admitting: Gastroenterology

## 2020-01-19 DIAGNOSIS — K7460 Unspecified cirrhosis of liver: Secondary | ICD-10-CM | POA: Diagnosis not present

## 2020-01-23 ENCOUNTER — Telehealth: Payer: Self-pay | Admitting: Internal Medicine

## 2020-01-23 NOTE — Telephone Encounter (Signed)
Pt returning call to LL> [phone]

## 2020-01-23 NOTE — Telephone Encounter (Signed)
Spoke to pt. See lab results

## 2020-01-30 ENCOUNTER — Encounter: Payer: Self-pay | Admitting: Gastroenterology

## 2020-01-30 ENCOUNTER — Ambulatory Visit (INDEPENDENT_AMBULATORY_CARE_PROVIDER_SITE_OTHER): Payer: Medicare Other | Admitting: Gastroenterology

## 2020-01-30 ENCOUNTER — Other Ambulatory Visit: Payer: Self-pay

## 2020-01-30 VITALS — BP 140/82 | HR 109 | Temp 97.1°F | Ht 64.0 in | Wt 293.4 lb

## 2020-01-30 DIAGNOSIS — B370 Candidal stomatitis: Secondary | ICD-10-CM | POA: Diagnosis not present

## 2020-01-30 DIAGNOSIS — K7460 Unspecified cirrhosis of liver: Secondary | ICD-10-CM | POA: Diagnosis not present

## 2020-01-30 DIAGNOSIS — K7581 Nonalcoholic steatohepatitis (NASH): Secondary | ICD-10-CM

## 2020-01-30 DIAGNOSIS — K219 Gastro-esophageal reflux disease without esophagitis: Secondary | ICD-10-CM

## 2020-01-30 MED ORDER — FLUCONAZOLE 100 MG PO TABS
100.0000 mg | ORAL_TABLET | Freq: Every day | ORAL | 0 refills | Status: AC
Start: 2020-01-30 — End: 2020-02-06

## 2020-01-30 NOTE — Patient Instructions (Addendum)
1. Take Diflucan 149m daily for 7 days. Call if you have persistent thrush.  2. We will plan on updating your labs and ultrasound in 07/2020 and return office visit to see Dr. CAbbey Chattersat that time.  3. Continue omeprazole daily.  4. Call with any questions or concerns.

## 2020-01-30 NOTE — Progress Notes (Signed)
Primary Care Physician: Sharilyn Sites, MD  Primary Gastroenterologist:  Formerly Barney Drain, MD   Chief Complaint  Patient presents with   Cirrhosis    f/u. Doing okay    HPI: Yolanda [name] is a 64 y.o. female here for follow-up.  She has a history of cirrhosis likely secondary to [place]. Family history of 2 nieces with Wilson's disease, one deceased. Patient screened for Wilson's disease in 2015, ceruloplasmin normal at 23, urinary copper normal at 11.  Patient reports being on Simponi now for psoriatic arthritis. She is on Cymbalta for fatigue and constant pain.  EGD and colonoscopy in April 2021.  Esophagus appeared normal, dilated due to history of dysphagia.  6 mm nodule removed from the gastric cardia, biopsy showed chronic gastritis and no H. pylori, mild portal hypertensive gastropathy.  8 polyps removed from the colon, tubular adenomas.  External and internal hemorrhoids noted.  Next colonoscopy in 3 years.  Swallowing is better since EGD/ED. Heartburn well controlled. BM regular. No melena, brbpr. No specific abdominal pain. Always hurts with her fibromyalgia. Appetite back, she reports this is the most she has ever weighed. She complains of thrush. States she was given a single dose of Diflucan but has persistent symptoms.  Labs and ultrasound are up-to-date. Right upper quadrant ultrasound July 12 was stable with no hepatoma. Labs from May 2021 with normal CBC, total bilirubin 0.9, alk phos 130, AST 57, ALT 45 essentially stable. Her MELD Na was 7.     Current Outpatient Medications  Medication Sig Dispense Refill   acetaminophen (TYLENOL) 500 MG tablet Take 500-1,000 mg by mouth every 6 (six) hours as needed for moderate pain or headache.     albuterol (VENTOLIN HFA) 108 (90 Base) MCG/ACT inhaler Inhale 1-2 puffs into the lungs every 6 (six) hours as needed for wheezing or shortness of breath. 6.7 g 0   aspirin EC 81 MG tablet Take 81 mg by mouth daily.       Cholecalciferol (VITAMIN D3) 2000 units TABS Take 2,000 Units by mouth daily.      clobetasol ointment (TEMOVATE) 1.61 % Apply 1 application topically every evening. Apply to lower legs     CRANBERRY-VITAMIN C PO Take 1 tablet by mouth daily.      DULoxetine (CYMBALTA) 30 MG capsule Take 30 mg by mouth daily.      fluocinonide (LIDEX) 0.05 % external solution Apply 1 application topically See admin instructions. Apply topically every 4 days     hydrocortisone 2.5 % cream Apply 1 application topically daily.     ketoconazole (NIZORAL) 2 % cream Apply 1 application topically daily.     Levothyroxine Sodium 100 MCG CAPS Take 100 mcg by mouth daily before breakfast.      Melatonin 10 MG CAPS Take 10 mg by mouth at bedtime as needed (sleep).     naproxen sodium (ALEVE) 220 MG tablet Take 220 mg by mouth daily as needed (pain).     olmesartan-hydrochlorothiazide (BENICAR HCT) 40-25 MG tablet Take 1 tablet by mouth daily.     Omega-3 Fatty Acids (FISH OIL) 1000 MG CAPS Take 1,000 mg by mouth daily.      omeprazole (PRILOSEC) 20 MG capsule 1 po 30 mins prior to [place]. (Patient taking differently: Take 20 mg by mouth daily. ) 90 capsule 3   predniSONE (STERAPRED UNI-PAK 48 TAB) 5 MG (48) TBPK tablet As directed     rosuvastatin (CRESTOR) 10 MG tablet Take  10 mg by mouth daily.     Turmeric 500 MG TABS Take 500 mg by mouth daily.      No current facility-administered medications for this visit.    Allergies as of 01/30/2020 - Review Complete 01/30/2020  Allergen Reaction Noted   Azithromycin Hives    Lisinopril Cough 04/19/2017   Sulfonamide derivatives Other (See Comments)     ROS:  General: Negative for anorexia, weight loss, fever, chills, positive fatigue, weakness. ENT: Negative for hoarseness, difficulty swallowing , nasal congestion. See HPI CV: Negative for chest pain, angina, palpitations, dyspnea on exertion, peripheral edema.  Respiratory: Negative for  dyspnea at rest, dyspnea on exertion, cough, sputum, wheezing.  GI: See history of present illness. GU:  Negative for dysuria, hematuria, urinary incontinence, urinary frequency, nocturnal urination.  Endo: Negative for unusual weight change.    Physical Examination:   BP (!) 140/82    Pulse (!) 109    Temp (!) 97.1 F (36.2 C)    Ht 5' 4"  (1.626 m)    Wt (!) 293 lb 6.4 oz (133.1 kg)    LMP 07/11/2007 (Approximate)    BMI 50.36 kg/m   General: Well-nourished, well-developed in no acute distress.  Eyes: No icterus. Mouth: Oropharyngeal mucosa moist and pink , white plaques noted over the entire tongue mild erythema  lungs: Clear to auscultation bilaterally.  Heart: Regular rate and rhythm, no murmurs rubs or gallops.  Abdomen: Bowel sounds are normal, nontender, nondistended, no hepatosplenomegaly or masses, no abdominal bruits or hernia , no rebound or guarding.  Exam limited due to body habitus Extremities: Trace bilateral lower extremity edema. No clubbing or deformities. Neuro: Alert and oriented x 4   Skin: Warm and dry, no jaundice.  Psoriasis noted on lower extremities Psych: Alert and cooperative, normal mood and affect.  Labs:  Lab Results  Component Value Date   CREATININE 0.61 12/01/2019   BUN 13 12/01/2019   NA 144 12/01/2019   K 4.3 12/01/2019   CL 96 12/01/2019   CO2 26 12/01/2019   Lab Results  Component Value Date   ALT 45 (H) 12/01/2019   AST 57 (H) 12/01/2019   ALKPHOS 130 (H) 12/01/2019   BILITOT 0.9 12/01/2019   Lab Results  Component Value Date   WBC 8.1 12/01/2019   HGB 15.7 12/01/2019   HCT 45.9 12/01/2019   MCV 92 12/01/2019   PLT 195 12/01/2019   Lab Results  Component Value Date   INR 1.1 12/01/2019   INR 1.1 10/24/2019   INR 1.1 06/18/2018     Imaging Studies: US ABDOMEN LIMITED RUQ  Result Date: 01/19/2020 CLINICAL DATA:  Hepatic cirrhosis. EXAM: ULTRASOUND ABDOMEN LIMITED RIGHT UPPER QUADRANT COMPARISON:  July 31, 2019. FINDINGS:  Gallbladder: No gallstones or wall thickening visualized. No sonographic Murphy sign noted by sonographer. Common bile duct: Diameter: 4 mm which is within normal limits. Liver: No focal lesion identified. Heterogeneous echotexture of hepatic parenchyma is noted with nodular contours consistent with hepatic cirrhosis. Portal vein is patent on color Doppler imaging with normal direction of blood flow towards the liver. Other: None. IMPRESSION: Findings consistent with hepatic cirrhosis. No definite focal sonographic intrahepatic abnormality is noted. Electronically Signed   By: Marijo Conception M.D.   On: 01/19/2020 12:19    Impression/plan: Pleasant 64 year old female with cirrhosis felt to be due to [place]. Family history of 2 nieces with Wilson's disease, patient screening was negative in 2015 with serum ceruloplasmin and urinary copper. She did  not have exam for KF rings as recommended. Her disease has been well compensated with most recent MELD Na of 7. No evidence of portal hypertension on EGD this year. She continues with hepatoma surveillance labs every 6 months. Due to body habitus, tried to get MRI in 2019 but insurance declined. She has been getting u/s since.   Continue labs and ruq u/s every 6 months. Plan for next one in 07/2020. Will have her come for OV at that time with Dr. Abbey Chatters.   GERD: continue omeprazole.  Oral candidiasis: Diflucan 147m daily for 7 days.

## 2020-02-02 ENCOUNTER — Other Ambulatory Visit: Payer: Self-pay | Admitting: Emergency Medicine

## 2020-02-02 DIAGNOSIS — K7460 Unspecified cirrhosis of liver: Secondary | ICD-10-CM

## 2020-04-20 ENCOUNTER — Encounter: Payer: Self-pay | Admitting: Internal Medicine

## 2020-05-01 IMAGING — MR MR HEAD WO/W CM
6 of 13 series · 24 of 48 positions shown · IV contrast (gadavist)
Comparison: None.

CLINICAL DATA: Ataxia, memory loss, lightheadedness

EXAM:
MRI HEAD WITHOUT AND WITH CONTRAST
TECHNIQUE: Multiplanar, multiecho pulse sequences of the brain and surrounding
structures were obtained without and with intravenous contrast.
CONTRAST:  10mL GADAVIST GADOBUTROL 1 MMOL/ML IV SOLN

[Series 2: DWI · axial · 3.0mm · 0.76mm/px · z∈[-99,+63]mm · 5 of 55 slices shown (1 of 2)]
[im 1/55]
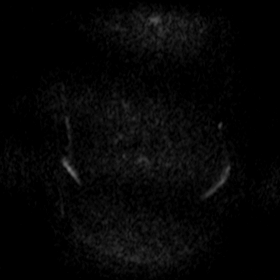
[im 14/55]
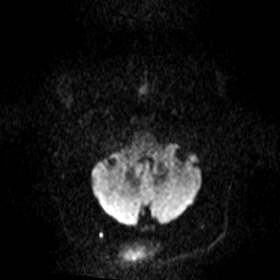
[im 28/55]
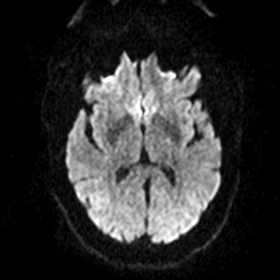
[im 41/55]
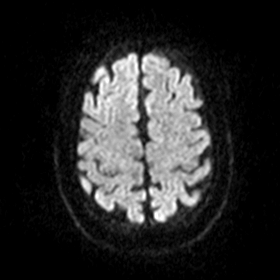
[im 55/55]
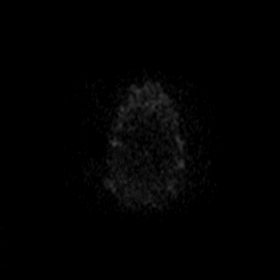

[Series 4: DWI · coronal · 5.0mm · 0.53mm/px · 3 of 36 slices shown (2 of 2)]
[im 1/36]
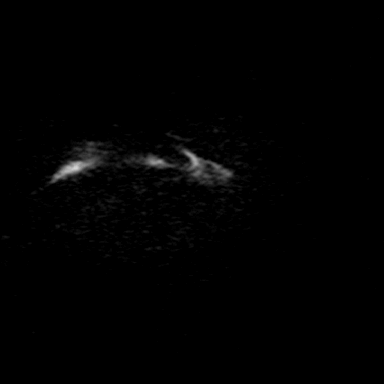
[im 18/36]
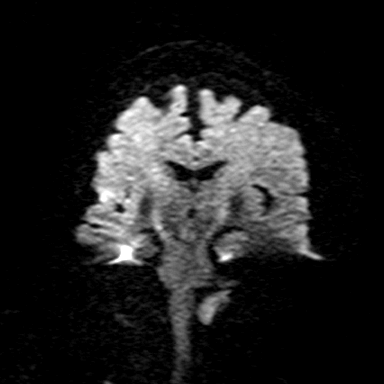
[im 36/36]
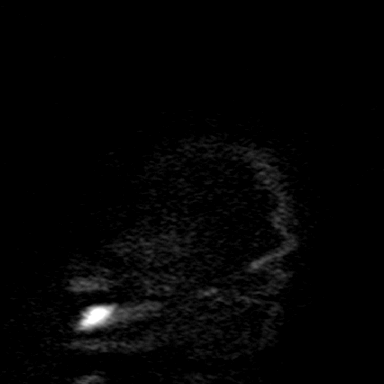

[Series 9: FLAIR · axial · 3.0mm · 0.34mm/px · z∈[-83,+61]mm · 4 of 49 slices shown]
[im 1/49]
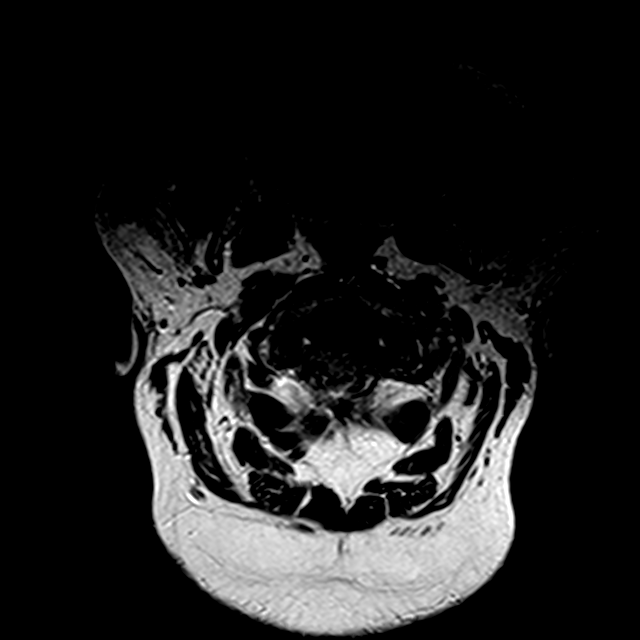
[im 17/49]
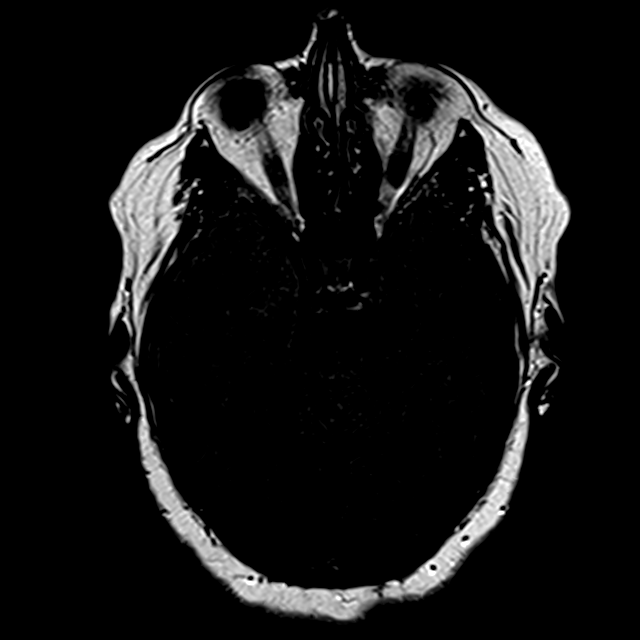
[im 33/49]
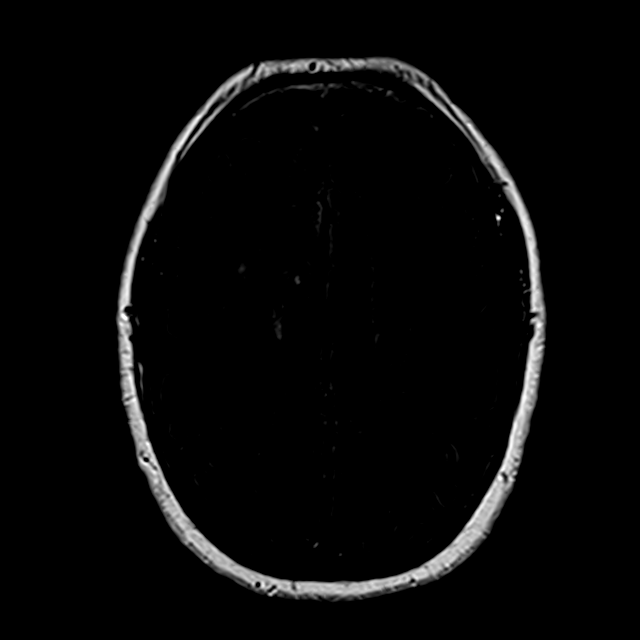
[im 49/49]
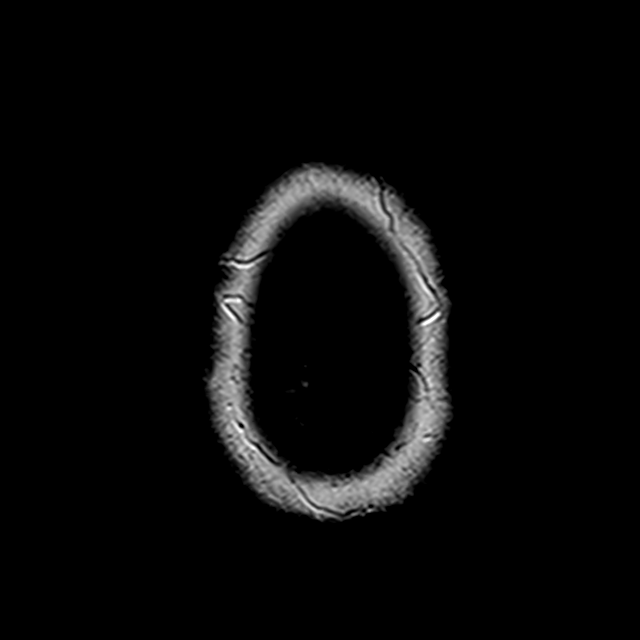

[Series 12: T1 post-contrast · axial · 2.0mm · 0.45mm/px · z∈[-88,+66]mm · 7 of 78 slices shown (1 of 3)]
[im 1/78]
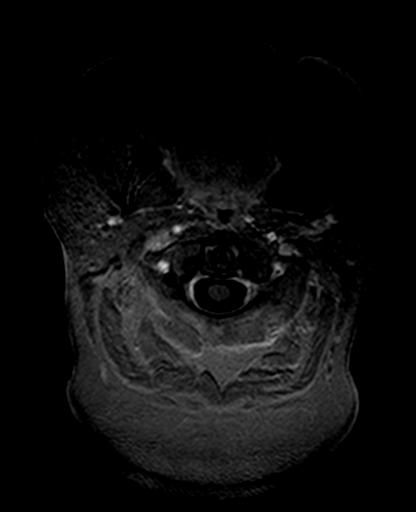
[im 13/78]
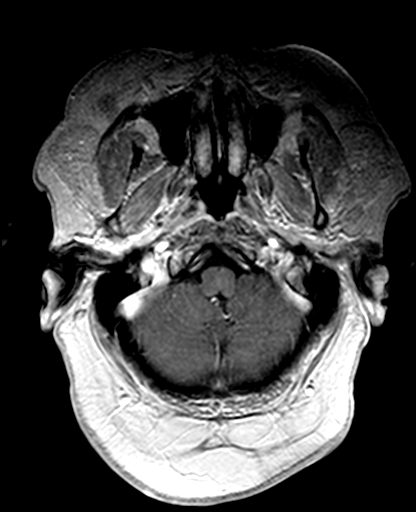
[im 26/78]
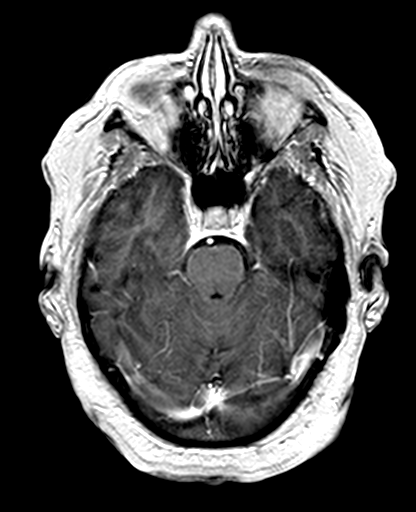
[im 39/78]
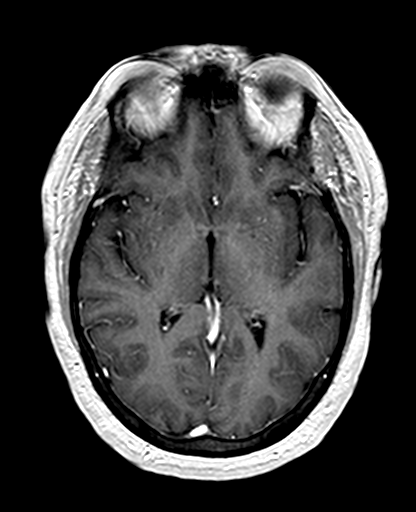
[im 52/78]
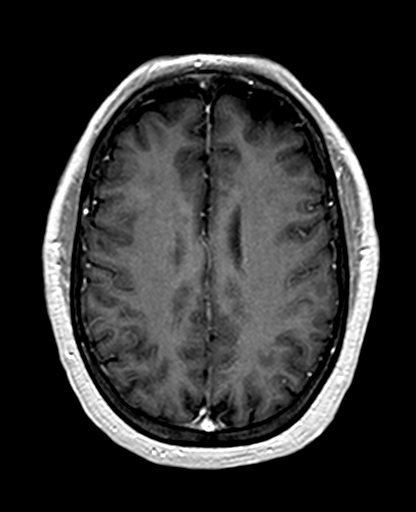
[im 65/78]
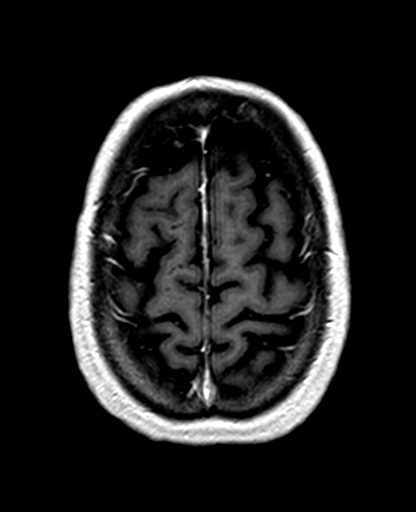
[im 78/78]
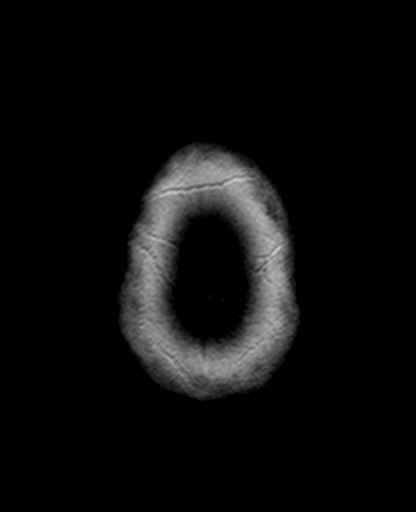

[Series 13: T1 post-contrast · coronal · 5.0mm · 0.39mm/px · 3 of 28 slices shown (2 of 3)]
[im 1/28]
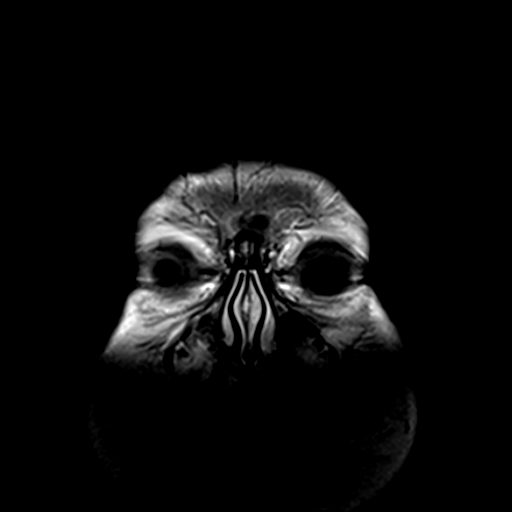
[im 14/28]
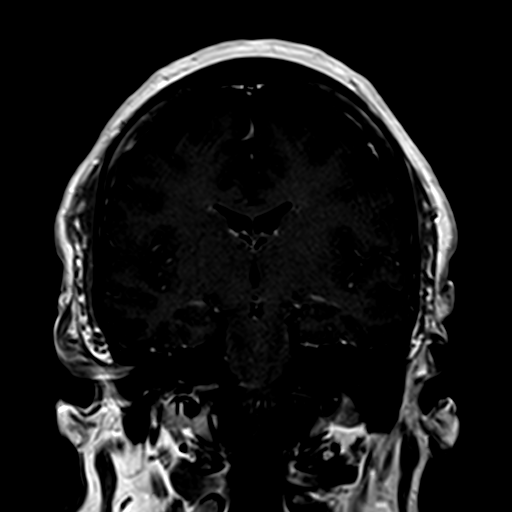
[im 28/28]
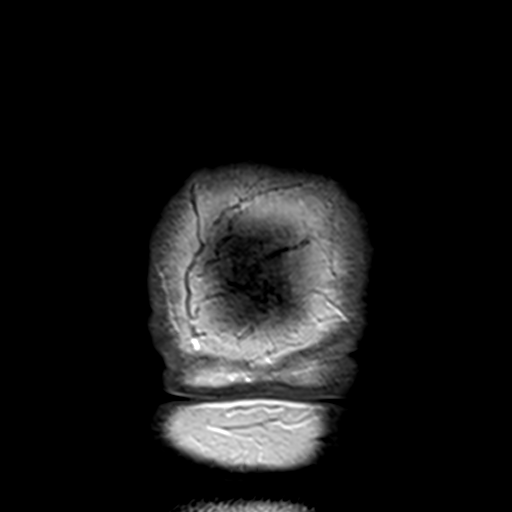

[Series 14: T1 post-contrast · sagittal · 5.0mm · 0.43mm/px · 2 of 23 slices shown (3 of 3)]
[im 1/23]
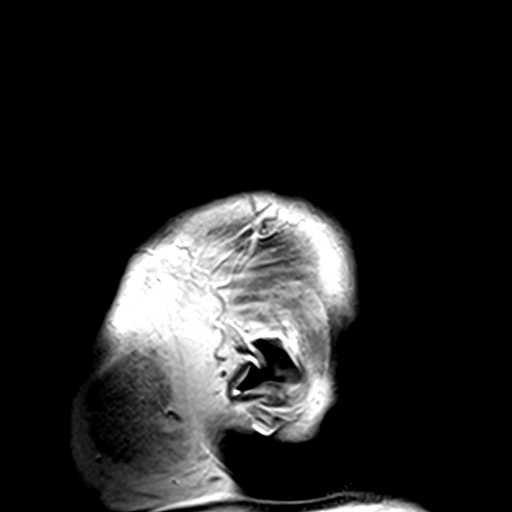
[im 23/23]
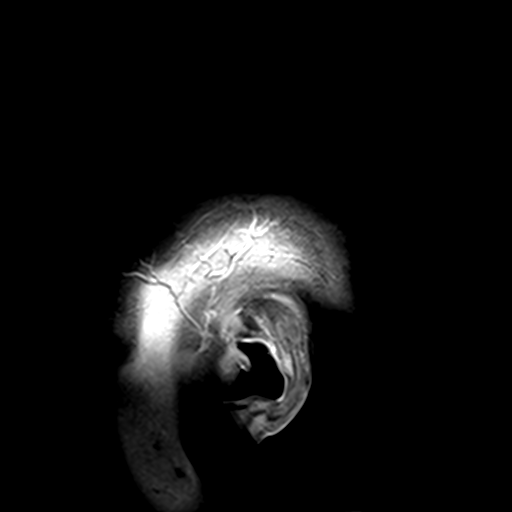

[24 of 48 positions shown; findings below may reference images not displayed]

FINDINGS: Brain: No acute infarction, hemorrhage, hydrocephalus, extra-axial
collection or mass lesion.

A few scattered foci of T2 hyperintensity are seen within the white
matter of the cerebral hemispheres, nonspecific, most likely related
to chronic small vessel ischemia. No significant volume loss. No
abnormal contrast enhancement seen.

Vascular: Normal flow voids.

Skull and upper cervical spine: Normal marrow signal.

Sinuses/Orbits: Negative.

Other: None.
IMPRESSION: 1. No acute intracranial abnormality.
2. Minimal chronic small vessel ischemia.

## 2020-06-18 ENCOUNTER — Other Ambulatory Visit: Payer: Self-pay

## 2020-06-18 ENCOUNTER — Ambulatory Visit (HOSPITAL_COMMUNITY)
Admission: RE | Admit: 2020-06-18 | Discharge: 2020-06-18 | Disposition: A | Discharge status: Home / Self Care | Payer: Medicare Other | Source: Ambulatory Visit | Attending: Urology | Admitting: Urology

## 2020-06-18 DIAGNOSIS — N200 Calculus of kidney: Secondary | ICD-10-CM | POA: Diagnosis not present

## 2020-06-30 ENCOUNTER — Ambulatory Visit: Payer: Medicare Other | Admitting: Urology

## 2020-07-07 ENCOUNTER — Other Ambulatory Visit: Payer: Self-pay | Admitting: *Deleted

## 2020-07-07 ENCOUNTER — Encounter: Payer: Self-pay | Admitting: Internal Medicine

## 2020-07-07 ENCOUNTER — Encounter: Payer: Self-pay | Admitting: *Deleted

## 2020-07-07 ENCOUNTER — Ambulatory Visit (INDEPENDENT_AMBULATORY_CARE_PROVIDER_SITE_OTHER): Payer: Medicare Other | Admitting: Internal Medicine

## 2020-07-07 ENCOUNTER — Other Ambulatory Visit: Payer: Self-pay

## 2020-07-07 VITALS — BP 125/75 | HR 92 | Temp 97.5°F | Ht 64.0 in | Wt 273.6 lb

## 2020-07-07 DIAGNOSIS — K5900 Constipation, unspecified: Secondary | ICD-10-CM

## 2020-07-07 DIAGNOSIS — K7581 Nonalcoholic steatohepatitis (NASH): Secondary | ICD-10-CM

## 2020-07-07 DIAGNOSIS — K7460 Unspecified cirrhosis of liver: Secondary | ICD-10-CM

## 2020-07-07 DIAGNOSIS — K219 Gastro-esophageal reflux disease without esophagitis: Secondary | ICD-10-CM

## 2020-07-07 DIAGNOSIS — E669 Obesity, unspecified: Secondary | ICD-10-CM

## 2020-07-07 NOTE — Patient Instructions (Signed)
We will schedule you for right upper quadrant ultrasound for liver cancer screening.  We will also check blood work today to evaluate your meld score/liver numbers.  Continue with your weight loss.  It is amazing what you have done so far!  Follow-up in 6 months or sooner if needed.  At [facility] we value your feedback. You may receive a survey about your visit today. Please share your experience as we strive to create trusting relationships with our patients to provide genuine, compassionate, quality care.  We appreciate your understanding and patience as we review any laboratory studies, imaging, and other diagnostic tests that are ordered as we care for you. Our office policy is 5 business days for review of these results, and any emergent or urgent results are addressed in a timely manner for your best interest. If you do not hear from our office in 1 week, please contact us.   We also encourage the use of MyChart, which contains your medical information for your review as well. If you are not enrolled in this feature, an access code is on this after visit summary for your convenience. Thank you for allowing [place] to be involved in your care.  It was great to see you today!  I hope you have a great rest of your winter!!    Elon Alas. Abbey Chatters, D.O. Gastroenterology and Hepatology [facility]

## 2020-07-07 NOTE — Progress Notes (Signed)
Referring Provider: Sharilyn Sites, MD Primary Care Physician:  Sharilyn Sites, MD Primary GI:  Dr. Abbey Chatters  Chief Complaint  Patient presents with  . Constipation    Has taken Dulcolax prn-helps but causes abdominal cramping  . Cirrhosis    HPI:   Yolanda [name] is a 64 y.o. female who presents to the clinic today for follow-up visit regarding Yolanda [name] cirrhosis, well compensated.  Patient states she feels great.  She recently started weight loss routine with [employer].  Appears she has lost nearly 30 pounds since October.  She states her cousin did the same program and lost over 100 pounds which was her inspiration.  Her daughter is also doing it and has lost nearly 20 pounds.  Denies any swelling or abdominal distention.  No history of ascites.    EGD 10/28/2019 showed no evidence of esophageal varices.  Colonoscopy at the same time did show multiple polyps which were tubular adenomas on pathology.  Most recent meld of 7, due for blood work today.  Last right upper quadrant ultrasound 01/19/2020 without evidence of HCC.  No history of hepatic encephalopathy.  Patient does note worsening constipation as of late.  No melena hematochezia  Past Medical History:  Diagnosis Date  . Abnormal uterine bleeding   . Anxiety   . Arthritis   . Calculus of kidney 07/09/2008   Qualifier: Diagnosis of  By: Kellie Simmering LPN, Almyra Free    . Chronic abdominal pain   . Chronic pain in left foot   . Clotting disorder ([place])   . Depression   . Elevated liver function tests 2018  . Endometriosis   . Family history of adverse reaction to anesthesia   . Fatty liver   . Fibromyalgia   . Gastritis   . GERD 12/21/2009   Qualifier: Diagnosis of  By: Craige Cotta    . History of kidney stones   . Hypertension    stopped meds in Aug 2015  . Hypothyroidism   . IBS (irritable bowel syndrome)   . Internal hemorrhoids   . Kidney stone   . Obesity, morbid ([place]) 09/11/2012  . PE 07/09/2008   Qualifier: Diagnosis of   By: Kellie Simmering LPN, Almyra Free    . PE (pulmonary embolism)   . PONV (postoperative nausea and vomiting)   . Sleep apnea   . Thyroid disease   . Vitamin D deficiency     Past Surgical History:  Procedure Laterality Date  . ABDOMINAL SURGERY     laparoscopy  . BIOPSY N/A 03/24/2014   Procedure: GASTRIC BIOPSIES;  Surgeon: Danie Binder, MD;  Location: [facility];  Service: Endoscopy;  Laterality: N/A;  . BIOPSY  10/28/2019   Procedure: BIOPSY;  Surgeon: Danie Binder, MD;  Location: [facility];  Service: Endoscopy;;  gastric nodule  . BREAST LUMPECTOMY Right   . CESAREAN SECTION     X2  . COLONOSCOPY  2006   internal hemorrhoids  . COLONOSCOPY WITH PROPOFOL N/A 03/24/2014   Dr. [name]: 2 tubular adenomas removed, hemorrhoids  . COLONOSCOPY WITH PROPOFOL N/A 10/28/2019   Fields: External and internal hemorrhoids, 8 polyps ranging from 2 to 5 mm in size removed from the colon.  Multiple tubular adenomas.  Next colonoscopy in 3 years.  . CYSTOSCOPY W/ RETROGRADES  01/23/2012   Procedure: CYSTOSCOPY WITH RETROGRADE PYELOGRAM;  Surgeon: Marissa Nestle, MD;  Location: [facility];  Service: Urology;  Laterality: Left;  . DILATATION & CURETTAGE/HYSTEROSCOPY WITH MYOSURE N/A 11/18/2014  Procedure: DILATATION & CURETTAGE/HYSTEROSCOPY WITH MYOSURE, resection of polyp;  Surgeon: Cheri Fowler, MD;  Location: [facility];  Service: Gynecology;  Laterality: N/A;  . DILATION AND CURETTAGE OF UTERUS    . ESOPHAGOGASTRODUODENOSCOPY   08/24/2006   Dr. Veto Kemps erythema of the antrum without erosion or ulcers/Otherwise, normal esophagus without evidence of Barrett's path with chronic gastritis  . ESOPHAGOGASTRODUODENOSCOPY (EGD) WITH PROPOFOL N/A 03/24/2014   Dr. [name]: gastritis  . ESOPHAGOGASTRODUODENOSCOPY (EGD) WITH PROPOFOL N/A 10/28/2019   Fields: Esophagus appeared normal, empiric dilation due to history of dysphagia.  6 mm nodule seen in the gastric cardia.  Mild portal hypertensive gastropathy.  Nodule  from the stomach biopsied and showed mild chronic gastritis, no H. pylori.  Marland Kitchen POLYPECTOMY N/A 03/24/2014   Procedure: POLYPECTOMY;  Surgeon: Danie Binder, MD;  Location: [facility];  Service: Endoscopy;  Laterality: N/A;  . POLYPECTOMY  10/28/2019   Procedure: POLYPECTOMY;  Surgeon: Danie Binder, MD;  [facility];  Service: Endoscopy;;  hepatic flexure, ascending colon,sigmoid colon, rectal  . REMOVAL OF STONES  01/23/2012   Procedure: REMOVAL OF STONES;  Surgeon: Marissa Nestle, MD;  Location: [facility];  Service: Urology;  Laterality: N/A;  . SAVORY DILATION N/A 10/28/2019   Procedure: SAVORY DILATION;  Surgeon: Danie Binder, MD;  Location: [facility];  Service: Endoscopy;  Laterality: N/A;  . TUBAL LIGATION      Current Outpatient Medications  Medication Sig Dispense Refill  . acetaminophen (TYLENOL) 500 MG tablet Take 500-1,000 mg by mouth every 6 (six) hours as needed for moderate pain or headache.    . albuterol (VENTOLIN HFA) 108 (90 Base) MCG/ACT inhaler Inhale 1-2 puffs into the lungs every 6 (six) hours as needed for wheezing or shortness of breath. 6.7 g 0  . aspirin EC 81 MG tablet Take 81 mg by mouth daily.    . bisacodyl (BISACODYL) 5 MG EC tablet Take 10 mg by mouth daily as needed.    . Cholecalciferol (VITAMIN D3) 2000 units TABS Take 2,000 Units by mouth daily.     . clobetasol ointment (TEMOVATE) 5.63 % Apply 1 application topically every evening. Apply to lower legs    . CRANBERRY-VITAMIN C PO Take 1 tablet by mouth daily.     . cyclobenzaprine (FLEXERIL) 10 MG tablet Take 10 mg by mouth daily.    . DULoxetine (CYMBALTA) 30 MG capsule Take 30 mg by mouth daily.     . fluocinonide (LIDEX) 0.05 % external solution Apply 1 application topically See admin instructions. Apply topically every 4 days    . Golimumab ([place] ARIA IV) Inject into the vein. Every 2 months    . hydrocortisone 2.5 % cream Apply 1 application topically as needed.    Marland Kitchen ketoconazole  (NIZORAL) 2 % cream Apply 1 application topically daily as needed.    . Levothyroxine Sodium 100 MCG CAPS Take 100 mcg by mouth daily before breakfast.     . Melatonin 10 MG CAPS Take 10 mg by mouth at bedtime as needed (sleep).    . naproxen sodium (ALEVE) 220 MG tablet Take 220 mg by mouth daily as needed (pain).    Marland Kitchen olmesartan-hydrochlorothiazide (BENICAR HCT) 40-25 MG tablet Take 1 tablet by mouth daily.    . Omega-3 Fatty Acids (FISH OIL) 1000 MG CAPS Take 1,000 mg by mouth daily.     Marland Kitchen omeprazole (PRILOSEC) 20 MG capsule 1 po 30 mins prior to [place]. (Patient taking differently: Take  20 mg by mouth daily.) 90 capsule 3  . rosuvastatin (CRESTOR) 10 MG tablet Take 10 mg by mouth daily.    . Turmeric 500 MG TABS Take 500 mg by mouth daily.     . predniSONE (STERAPRED UNI-PAK 48 TAB) 5 MG (48) TBPK tablet As directed (Patient not taking: Reported on 07/07/2020)     No current facility-administered medications for this visit.    Allergies as of 07/07/2020 - Review Complete 07/07/2020  Allergen Reaction Noted  . Azithromycin Hives   . Lisinopril Cough 04/19/2017  . Sulfonamide derivatives Other (See Comments)     Family History  Problem Relation Age of Onset  . Heart attack Mother        CABG  . Stroke Mother   . Hypertension Mother   . Cancer Mother   . Diabetes Mother   . Heart attack Father        CABG  . Hypertension Father   . Mesothelioma Father   . Heart attack Brother   . Diabetes Brother   . Depression Brother   . Hyperlipidemia Brother   . Diabetes Maternal Grandmother   . Diabetes Maternal Grandfather   . Cancer Paternal Grandmother   . Diabetes Brother   . Hyperlipidemia Brother   . Wilson's disease Other        2 nieces, one died in her 30s  . Colon cancer Neg Hx     Social History   Socioeconomic History  . Marital status: Divorced    Spouse name: Not on file  . Number of children: 2  . Years of education: 71  . Highest education level: Not on  file  Occupational History  . Occupation: Social research officer, government: [employer]  Tobacco Use  . Smoking status: Former Smoker    Years: 20.00    Types: Cigarettes    Quit date: 10/24/1998    Years since quitting: 21.7  . Smokeless tobacco: Never Used  . Tobacco comment: 1 cig daily  Vaping Use  . Vaping Use: Never used  Substance and Sexual Activity  . Alcohol use: Not Currently    Comment: "no alcohol in years"  . Drug use: No  . Sexual activity: Never    Birth control/protection: Post-menopausal  Other Topics Concern  . Not on file  Social History Narrative   Right handed   One story with a basement   Drinks caffeine   Social Determinants of Health   Financial Resource Strain: Not on file  Food Insecurity: Not on file  Transportation Needs: Not on file  Physical Activity: Not on file  Stress: Not on file  Social Connections: Not on file    Subjective: Review of Systems  Constitutional: Negative for chills and fever.  HENT: Negative for congestion and hearing loss.   Eyes: Negative for blurred vision and double vision.  Respiratory: Negative for cough and shortness of breath.   Cardiovascular: Negative for chest pain and palpitations.  Gastrointestinal: Negative for abdominal pain, blood in stool, constipation, diarrhea, heartburn, melena and vomiting.  Genitourinary: Negative for dysuria and urgency.  Musculoskeletal: Negative for joint pain and myalgias.  Skin: Negative for itching and rash.  Neurological: Negative for dizziness and headaches.  Psychiatric/Behavioral: Negative for depression. The patient is not nervous/anxious.      Objective: BP 125/75   Pulse 92   Temp (!) 97.5 F (36.4 C) (Temporal)   Ht 5' 4"  (1.626 m)   Wt 273 lb  9.6 oz (124.1 kg)   LMP 07/11/2007 (Approximate)   BMI 46.96 kg/m  Physical Exam Constitutional:      Appearance: Normal appearance.  HENT:     Head: Normocephalic and atraumatic.  Eyes:      Extraocular Movements: Extraocular movements intact.     Conjunctiva/sclera: Conjunctivae normal.  Cardiovascular:     Rate and Rhythm: Normal rate and regular rhythm.  Pulmonary:     Effort: Pulmonary effort is normal.     Breath sounds: Normal breath sounds.  Abdominal:     General: Bowel sounds are normal.     Palpations: Abdomen is soft.  Musculoskeletal:        General: No swelling. Normal range of motion.     Cervical back: Normal range of motion and neck supple.  Skin:    General: Skin is warm and dry.     Coloration: Skin is not jaundiced.  Neurological:     General: No focal deficit present.     Mental Status: She is alert and oriented to person, place, and time.  Psychiatric:        Mood and Affect: Mood normal.        Behavior: Behavior normal.      Assessment: *Cirrhosis-due to Yolanda [name], well compensated *Obesity-patient has lost nearly 30 pounds *Constipation *Obesity-well-controlled on omeprazole 20 mg daily  Plan: Patient cirrhosis due to [place], well compensated.  Most recent meld of 7 -We will update meld labs today. -Right upper quadrant ultrasound ordered for [place] screening -EGD 10/28/2019 showed no evidence of esophageal varices.  We will plan on repeat in 2023 or sooner if decompensating event. -No history of hepatic encephalopathy -Counseled on high-protein low-sodium diet -Congratulated her on her weight loss.  We will continue to monitor this. -For her constipation I recommended that she start taking MiraLAX 17 g twice daily.  If this is not adequate she can add on Dulcolax 1-2 times daily as well. -Continue on omeprazole for chronic reflux which is well controlled -Patient follow-up in 6 months or sooner if needed.  07/07/2020 4:16 PM   Disclaimer: This note was dictated with voice recognition software. Similar sounding words can inadvertently be transcribed and may not be corrected upon review.

## 2020-07-09 LAB — COMPLETE METABOLIC PANEL WITH GFR
AG Ratio: 0.9 (calc) — ABNORMAL LOW (ref 1.0–2.5)
ALT: 40 U/L — ABNORMAL HIGH (ref 6–29)
AST: 52 U/L — ABNORMAL HIGH (ref 10–35)
Albumin: 3.3 g/dL — ABNORMAL LOW (ref 3.6–5.1)
Alkaline phosphatase (APISO): 107 U/L (ref 37–153)
BUN: 12 mg/dL (ref 7–25)
CO2: 32 mmol/L (ref 20–32)
Calcium: 9 mg/dL (ref 8.6–10.4)
Chloride: 103 mmol/L (ref 98–110)
Creat: 0.59 mg/dL (ref 0.50–0.99)
GFR, Est African American: 111 mL/min/{1.73_m2} (ref >=60)
GFR, Est Non African American: 96 mL/min/{1.73_m2} (ref >=60)
Globulin: 3.8 g/dL (calc) — ABNORMAL HIGH (ref 1.9–3.7)
Glucose, Bld: 107 mg/dL — ABNORMAL HIGH (ref 65–99)
Potassium: 4 mmol/L (ref 3.5–5.3)
Sodium: 139 mmol/L (ref 135–146)
Total Bilirubin: 0.5 mg/dL (ref 0.2–1.2)
Total Protein: 7.1 g/dL (ref 6.1–8.1)

## 2020-07-09 LAB — PROTIME-INR
INR: 1.1
Prothrombin Time: 11.7 s — ABNORMAL HIGH (ref 9.0–11.5)

## 2020-07-12 NOTE — Progress Notes (Signed)
Sent via mychart

## 2020-07-15 ENCOUNTER — Ambulatory Visit (HOSPITAL_COMMUNITY): Admission: RE | Admit: 2020-07-15 | Payer: Medicare Other | Source: Ambulatory Visit

## 2020-07-21 ENCOUNTER — Ambulatory Visit (HOSPITAL_COMMUNITY)
Admission: RE | Admit: 2020-07-21 | Discharge: 2020-07-21 | Disposition: A | Discharge status: Home / Self Care | Payer: Medicare Other | Source: Ambulatory Visit | Attending: Internal Medicine | Admitting: Internal Medicine

## 2020-07-21 ENCOUNTER — Other Ambulatory Visit: Payer: Self-pay

## 2020-07-21 DIAGNOSIS — K7581 Nonalcoholic steatohepatitis (NASH): Secondary | ICD-10-CM | POA: Diagnosis not present

## 2020-07-21 DIAGNOSIS — K7460 Unspecified cirrhosis of liver: Secondary | ICD-10-CM | POA: Insufficient documentation

## 2020-08-02 ENCOUNTER — Ambulatory Visit (INDEPENDENT_AMBULATORY_CARE_PROVIDER_SITE_OTHER): Payer: Medicare Other | Admitting: Urology

## 2020-08-02 ENCOUNTER — Other Ambulatory Visit: Payer: Self-pay

## 2020-08-02 ENCOUNTER — Encounter: Payer: Self-pay | Admitting: Urology

## 2020-08-02 VITALS — BP 94/58 | HR 116 | Temp 97.7°F | Ht 64.0 in | Wt 274.0 lb

## 2020-08-02 DIAGNOSIS — N200 Calculus of kidney: Secondary | ICD-10-CM | POA: Diagnosis not present

## 2020-08-02 LAB — URINALYSIS, ROUTINE W REFLEX MICROSCOPIC
Bilirubin, UA: NEGATIVE
Glucose, UA: NEGATIVE
Ketones, UA: NEGATIVE
Leukocytes,UA: NEGATIVE
Nitrite, UA: NEGATIVE
Protein,UA: NEGATIVE
Specific Gravity, UA: 1.02 (ref 1.005–1.030)
Urobilinogen, Ur: 0.2 mg/dL (ref 0.2–1.0)
pH, UA: 6 (ref 5.0–7.5)

## 2020-08-02 LAB — MICROSCOPIC EXAMINATION: Renal Epithel, UA: NONE SEEN /hpf

## 2020-08-02 NOTE — Patient Instructions (Signed)

## 2020-08-02 NOTE — Progress Notes (Signed)
Urological Symptom Review  Patient is experiencing the following symptoms: Kidney stones   Review of Systems  Gastrointestinal (upper)  : Negative for upper GI symptoms  Gastrointestinal (lower) : Negative for lower GI symptoms  Constitutional : Fatigue  Skin: Skin rash/lesion  itching Eyes: Blurred vision  Ear/Nose/Throat : Sinus problems  Hematologic/Lymphatic: Negative for Hematologic/Lymphatic symptoms  Cardiovascular : Negative for cardiovascular symptoms  Respiratory : Negative for respiratory symptoms  Endocrine: Negative for endocrine symptoms  Musculoskeletal: Back pain Joint pain  Neurological: Dizziness  Psychologic: Negative for psychiatric symptoms

## 2020-08-02 NOTE — Progress Notes (Signed)
Dear Barnetta Chapel,  No evidence of Ramirez-Perez. Repeat U/S in 6 months. Follow-up as previously scheduled.   If you have any questions please contact us.     Thank you, Floria Raveling, CMA

## 2020-08-02 NOTE — Progress Notes (Signed)
08/02/2020 2:06 PM   Larena Sox 01/07/1956 [id number]  Referring provider: Sharilyn Sites, MD [address] [place],  [place] [address]  nephrolithiasis  HPI: Ms Lantier is a [phone] here for followup for nephrolithiasis. 1 stone event since last. Renal US shows a 69m right mid pole calculus. No significant LUTS. She has felt fatigue for 3 days. HR 116. UA normal. She drinks 64oz of water daily and lime juice.   PMH: Past Medical History:  Diagnosis Date  . Abnormal uterine bleeding   . Anxiety   . Arthritis   . Calculus of kidney 07/09/2008   Qualifier: Diagnosis of  By: LKellie SimmeringLPN, JAlmyra Free   . Chronic abdominal pain   . Chronic pain in left foot   . Clotting disorder ([place]   . Depression   . Elevated liver function tests 2018  . Endometriosis   . Family history of adverse reaction to anesthesia   . Fatty liver   . Fibromyalgia   . Gastritis   . GERD 12/21/2009   Qualifier: Diagnosis of  By: MCraige Cotta   . History of kidney stones   . Hypertension    stopped meds in Aug 2015  . Hypothyroidism   . IBS (irritable bowel syndrome)   . Internal hemorrhoids   . Kidney stone   . Obesity, morbid ([place] 09/11/2012  . PE 07/09/2008   Qualifier: Diagnosis of  By: LKellie SimmeringLPN, JAlmyra Free   . PE (pulmonary embolism)   . PONV (postoperative nausea and vomiting)   . Sleep apnea   . Thyroid disease   . Vitamin D deficiency     Surgical History: Past Surgical History:  Procedure Laterality Date  . ABDOMINAL SURGERY     laparoscopy  . BIOPSY N/A 03/24/2014   Procedure: GASTRIC BIOPSIES;  Surgeon: SDanie Binder MD;  Location: [facility];  Service: Endoscopy;  Laterality: N/A;  . BIOPSY  10/28/2019   Procedure: BIOPSY;  Surgeon: FDanie Binder MD;  [facility];  Service: Endoscopy;;  gastric nodule  . BREAST LUMPECTOMY Right   . CESAREAN SECTION     X2  . COLONOSCOPY  2006   internal hemorrhoids  . COLONOSCOPY WITH PROPOFOL N/A 03/24/2014   Dr. FOneida Alar 2  tubular adenomas removed, hemorrhoids  . COLONOSCOPY WITH PROPOFOL N/A 10/28/2019   Fields: External and internal hemorrhoids, 8 polyps ranging from 2 to 5 mm in size removed from the colon.  Multiple tubular adenomas.  Next colonoscopy in 3 years.  . CYSTOSCOPY W/ RETROGRADES  01/23/2012   Procedure: CYSTOSCOPY WITH RETROGRADE PYELOGRAM;  Surgeon: MMarissa Nestle MD;  Location: [facility];  Service: Urology;  Laterality: Left;  . DILATATION & CURETTAGE/HYSTEROSCOPY WITH MYOSURE N/A 11/18/2014   Procedure: DILATATION & CURETTAGE/HYSTEROSCOPY WITH MYOSURE, resection of polyp;  Surgeon: TCheri Fowler MD;  [facility];  Service: Gynecology;  Laterality: N/A;  . DILATION AND CURETTAGE OF UTERUS    . ESOPHAGOGASTRODUODENOSCOPY   08/24/2006   Dr. FVeto Kempserythema of the antrum without erosion or ulcers/Otherwise, normal esophagus without evidence of Barrett's path with chronic gastritis  . ESOPHAGOGASTRODUODENOSCOPY (EGD) WITH PROPOFOL N/A 03/24/2014   Dr. FOneida Alar gastritis  . ESOPHAGOGASTRODUODENOSCOPY (EGD) WITH PROPOFOL N/A 10/28/2019   Fields: Esophagus appeared normal, empiric dilation due to history of dysphagia.  6 mm nodule seen in the gastric cardia.  Mild portal hypertensive gastropathy.  Nodule from the stomach biopsied and showed mild chronic gastritis, no H. pylori.  .Marland KitchenPOLYPECTOMY N/A  03/24/2014   Procedure: POLYPECTOMY;  Surgeon: Danie Binder, MD;  Location: [facility];  Service: Endoscopy;  Laterality: N/A;  . POLYPECTOMY  10/28/2019   Procedure: POLYPECTOMY;  Surgeon: Danie Binder, MD;  [facility];  Service: Endoscopy;;  hepatic flexure, ascending colon,sigmoid colon, rectal  . REMOVAL OF STONES  01/23/2012   Procedure: REMOVAL OF STONES;  Surgeon: Marissa Nestle, MD;  Location: [facility];  Service: Urology;  Laterality: N/A;  . SAVORY DILATION N/A 10/28/2019   Procedure: SAVORY DILATION;  Surgeon: Danie Binder, MD;  [facility];  Service: Endoscopy;   Laterality: N/A;  . TUBAL LIGATION      Home Medications:  Allergies as of 08/02/2020      Reactions   Azithromycin Hives   Lisinopril Cough   Sulfonamide Derivatives Other (See Comments)   Vaginal Infection       Medication List       Accurate as of August 02, 2020  2:06 PM. If you have any questions, ask your nurse or doctor.        STOP taking these medications   predniSONE 5 MG (48) Tbpk tablet Commonly known as: STERAPRED UNI-PAK 48 TAB Stopped by: Nicolette Bang, MD     TAKE these medications   acetaminophen 500 MG tablet Commonly known as: TYLENOL Take 500-1,000 mg by mouth every 6 (six) hours as needed for moderate pain or headache.   albuterol 108 (90 Base) MCG/ACT inhaler Commonly known as: VENTOLIN HFA Inhale 1-2 puffs into the lungs every 6 (six) hours as needed for wheezing or shortness of breath.   aspirin EC 81 MG tablet Take 81 mg by mouth daily.   bisacodyl 5 MG EC tablet Generic drug: bisacodyl Take 10 mg by mouth daily as needed.   clobetasol ointment 0.05 % Commonly known as: TEMOVATE Apply 1 application topically every evening. Apply to lower legs   CRANBERRY-VITAMIN C PO Take 1 tablet by mouth daily.   cyclobenzaprine 10 MG tablet Commonly known as: FLEXERIL Take 10 mg by mouth daily.   DULoxetine 30 MG capsule Commonly known as: CYMBALTA Take 30 mg by mouth daily.   Fish Oil 1000 MG Caps Take 1,000 mg by mouth daily.   fluocinonide 0.05 % external solution Commonly known as: LIDEX Apply 1 application topically See admin instructions. Apply topically every 4 days   hydrocortisone 2.5 % cream Apply 1 application topically as needed.   ketoconazole 2 % cream Commonly known as: NIZORAL Apply 1 application topically daily as needed.   Levothyroxine Sodium 100 MCG Caps Take 100 mcg by mouth daily before breakfast.   meclizine 25 MG tablet Commonly known as: ANTIVERT Take 25 mg by mouth 3 (three) times daily as needed for  dizziness.   Melatonin 10 MG Caps Take 10 mg by mouth at bedtime as needed (sleep).   naproxen sodium 220 MG tablet Commonly known as: ALEVE Take 220 mg by mouth daily as needed (pain).   olmesartan-hydrochlorothiazide 40-25 MG tablet Commonly known as: BENICAR HCT Take 1 tablet by mouth daily.   omeprazole 20 MG capsule Commonly known as: PRILOSEC 1 po 30 mins prior to [place]. What changed:   how much to take  how to take this  when to take this  additional instructions   rosuvastatin 10 MG tablet Commonly known as: CRESTOR Take 10 mg by mouth daily.   [employer] ARIA IV Inject into the vein. Every 2 months   Turmeric 500  MG Tabs Take 500 mg by mouth daily.   Vitamin D3 50 MCG (2000 UT) Tabs Take 2,000 Units by mouth daily.       Allergies:  Allergies  Allergen Reactions  . Azithromycin Hives  . Lisinopril Cough  . Sulfonamide Derivatives Other (See Comments)    Vaginal Infection     Family History: Family History  Problem Relation Age of Onset  . Heart attack Mother        CABG  . Stroke Mother   . Hypertension Mother   . Cancer Mother   . Diabetes Mother   . Heart attack Father        CABG  . Hypertension Father   . Mesothelioma Father   . Heart attack Brother   . Diabetes Brother   . Depression Brother   . Hyperlipidemia Brother   . Diabetes Maternal Grandmother   . Diabetes Maternal Grandfather   . Cancer Paternal Grandmother   . Diabetes Brother   . Hyperlipidemia Brother   . Wilson's disease Other        2 nieces, one died in her 23s  . Colon cancer Neg Hx     Social History:  reports that she quit smoking about 21 years ago. Her smoking use included cigarettes. She quit after 20.00 years of use. She has never used smokeless tobacco. She reports previous alcohol use. She reports that she does not use drugs.  ROS: All other review of systems were reviewed and are negative except what is noted above in HPI  Physical Exam: BP  (!) 94/58   Pulse (!) 116   Temp 97.7 F (36.5 C)   Ht 5' 4"  (1.626 m)   Wt 274 lb (124.3 kg)   LMP 07/11/2007 (Approximate)   BMI 47.03 kg/m   Constitutional:  Alert and oriented, No acute distress. HEENT:  AT, moist mucus membranes.  Trachea midline, no masses. Cardiovascular: No clubbing, cyanosis, or edema. Respiratory: Normal respiratory effort, no increased work of breathing. GI: Abdomen is soft, nontender, nondistended, no abdominal masses GU: No CVA tenderness.  Lymph: No cervical or inguinal lymphadenopathy. Skin: No rashes, bruises or suspicious lesions. Neurologic: Grossly intact, no focal deficits, moving all 4 extremities. Psychiatric: Normal mood and affect.  Laboratory Data: Lab Results  Component Value Date   WBC 8.1 12/01/2019   HGB 15.7 12/01/2019   HCT 45.9 12/01/2019   MCV 92 12/01/2019   PLT 195 12/01/2019    Lab Results  Component Value Date   CREATININE 0.59 07/08/2020    No results found for: PSA  No results found for: TESTOSTERONE  No results found for: HGBA1C  Urinalysis    Component Value Date/Time   COLORURINE YELLOW 04/15/2015 0028   APPEARANCEUR HAZY (A) 04/15/2015 0028   LABSPEC 1.020 04/15/2015 0028   PHURINE 6.0 04/15/2015 0028   GLUCOSEU NEGATIVE 04/15/2015 0028   HGBUR LARGE (A) 04/15/2015 0028   BILIRUBINUR neg 07/02/2019 0932   KETONESUR TRACE (A) 04/15/2015 0028   PROTEINUR Negative 07/02/2019 0932   PROTEINUR NEGATIVE 04/15/2015 0028   UROBILINOGEN negative (A) 07/02/2019 0932   UROBILINOGEN 0.2 04/15/2015 0028   NITRITE neg 07/02/2019 0932   NITRITE NEGATIVE 04/15/2015 0028   LEUKOCYTESUR Negative 07/02/2019 0932    Lab Results  Component Value Date   WBCUA 0-5 03/07/2017   RBCUA 0-2 03/07/2017   LABEPIT 0-10 03/07/2017   MUCUS Present 03/07/2017   BACTERIA Few 03/07/2017    Pertinent Imaging: Renal US 06/2020: Images reviewed  and discussed with the patient No results found for this or any previous  visit.  No results found for this or any previous visit.  No results found for this or any previous visit.  No results found for this or any previous visit.  Results for orders placed during the hospital encounter of 06/18/20  Ultrasound renal complete  Narrative CLINICAL DATA:  Nephrolithiasis  EXAM: RENAL / URINARY TRACT ULTRASOUND COMPLETE  COMPARISON:  06/25/2019.  CT 09/27/2017  FINDINGS: Right Kidney:  Renal measurements: 13.9 x 5.9 x 6.1 cm = volume: 261 mL. 6 mm shadowing stone in the midpole, nonobstructing. Normal echotexture. No hydronephrosis.  Left Kidney:  Renal measurements: 13.5 x 7.3 x 5.5 cm = volume: 283 mL. Echogenicity within normal limits. No mass or hydronephrosis visualized.  Bladder:  Appears normal for degree of bladder distention.  Other:  None.  IMPRESSION: 6 mm nonobstructing right midpole renal stone.  No acute findings.  No hydronephrosis.   Electronically Signed By: Rolm Baptise M.D. On: 06/18/2020 19:37  No results found for this or any previous visit.  No results found for this or any previous visit.  Results for orders placed during the hospital encounter of 09/27/17  CT RENAL STONE STUDY  Narrative CLINICAL DATA:  Bilateral flank pain for 1 month.  EXAM: CT ABDOMEN AND PELVIS WITHOUT CONTRAST  TECHNIQUE: Multidetector CT imaging of the abdomen and pelvis was performed following the standard protocol without IV contrast.  COMPARISON:  CT scan 04/24/2016  FINDINGS: Lower chest: The lung bases are clear of acute process. Peripheral interstitial changes appear stable and likely related to interstitial lung disease. No pleural effusion or focal pulmonary lesion/nodule. The heart is upper limits of normal in size for age. Prominent epicardial and pericardial fat. Mild lipomatosis hypertrophy of the interatrial septum. The distal esophagus is grossly normal.  Hepatobiliary: Progressive cirrhotic changes  involving the liver. Very irregular liver contour with increased caudate to right lobe ratio, dilated hepatic fissures and evidence of portal venous hypertension and portal venous collaterals. No splenomegaly. No ascites.  No focal hepatic lesions or intrahepatic biliary dilatation. The gallbladder is grossly normal. No common bile duct dilatation.  Pancreas: No mass, inflammation or ductal dilatation.  Spleen: Normal size.  No focal lesions.  Adrenals/Urinary Tract: The adrenal glands are unremarkable.  Two right-sided renal calculi. No left-sided renal calculi. No obstructing ureteral calculi or bladder calculi. No worrisome renal or bladder lesions without contrast.  Stomach/Bowel: The stomach, duodenum, small bowel and colon are grossly normal without oral contrast. No inflammatory changes, mass lesions or obstructive findings. The terminal ileum and appendix are normal.  Vascular/Lymphatic: The aorta is normal in caliber. No atheroscerlotic calcifications. No mesenteric of retroperitoneal mass or adenopathy. Small scattered lymph nodes are noted.  Reproductive: The uterus and ovaries are normal.  Other: No pelvic mass or adenopathy. No free pelvic fluid collections. No inguinal mass or adenopathy. No abdominal wall hernia or subcutaneous lesions.  Musculoskeletal: No significant bony findings. Mild degenerative changes involving both hips and the lumbar spine.  IMPRESSION: 1. Two right-sided renal calculi but no obstructing ureteral calculi or bladder calculi. No worrisome renal or bladder lesions without contrast. 2. No acute abdominal/pelvic findings, mass lesions or lymphadenopathy. 3. Progressive cirrhotic changes involving the liver as described above. No worrisome hepatic lesion, splenomegaly or ascites.   Electronically Signed By: Marijo Sanes M.D. On: 09/28/2017 10:37   Assessment & Plan:    1. Kidney stones -We discussed the management of  kidney  stones. These options include observation, ureteroscopy, shockwave lithotripsy (ESWL) and percutaneous nephrolithotomy (PCNL). We discussed which options are relevant to the patient's stone(s). We discussed the natural history of kidney stones as well as the complications of untreated stones and the impact on quality of life without treatment as well as with each of the above listed treatments. We also discussed the efficacy of each treatment in its ability to clear the stone burden. With any of these management options I discussed the signs and symptoms of infection and the need for emergent treatment should these be experienced. For each option we discussed the ability of each procedure to clear the patient of their stone burden.   For observation I described the risks which include but are not limited to silent renal damage, life-threatening infection, need for emergent surgery, failure to pass stone and pain.   For ureteroscopy I described the risks which include bleeding, infection, damage to contiguous structures, positioning injury, ureteral stricture, ureteral avulsion, ureteral injury, need for prolonged ureteral stent, inability to perform ureteroscopy, need for an interval procedure, inability to clear stone burden, stent discomfort/pain, heart attack, stroke, pulmonary embolus and the inherent risks with general anesthesia.   For shockwave lithotripsy I described the risks which include arrhythmia, kidney contusion, kidney hemorrhage, need for transfusion, pain, inability to adequately break up stone, inability to pass stone fragments, Steinstrasse, infection associated with obstructing stones, need for alternate surgical procedure, need for repeat shockwave lithotripsy, MI, CVA, PE and the inherent risks with anesthesia/conscious sedation.   For PCNL I described the risks including positioning injury, pneumothorax, hydrothorax, need for chest tube, inability to clear stone burden, renal  laceration, arterial venous fistula or malformation, need for embolization of kidney, loss of kidney or renal function, need for repeat procedure, need for prolonged nephrostomy tube, ureteral avulsion, MI, CVA, PE and the inherent risks of general anesthesia.   - The patient would like to proceed with observation. RTC 6 months with renal US and KUB - Urinalysis, Routine w reflex microscopic   No follow-ups on file.  Nicolette Bang, MD  [facility] [place]

## 2020-12-21 ENCOUNTER — Encounter: Payer: Self-pay | Admitting: Internal Medicine

## 2021-01-04 ENCOUNTER — Telehealth: Payer: Self-pay | Admitting: Internal Medicine

## 2021-01-04 NOTE — Telephone Encounter (Signed)
Recall for ultrasound 

## 2021-01-04 NOTE — Telephone Encounter (Signed)
Recall sent 

## 2021-01-11 ENCOUNTER — Telehealth: Payer: Self-pay | Admitting: Internal Medicine

## 2021-01-11 DIAGNOSIS — K7460 Unspecified cirrhosis of liver: Secondary | ICD-10-CM

## 2021-01-11 NOTE — Telephone Encounter (Signed)
Pt received letter that it was time to schedule U/S. [phone]

## 2021-01-12 NOTE — Telephone Encounter (Signed)
[place] abd RUQ scheduled for 01/19/21 at 8:30am, arrive at 8:15am. NPO after midnight prior to test.  Called and informed pt of [place] appt. Letter mailed.

## 2021-01-19 ENCOUNTER — Other Ambulatory Visit: Payer: Self-pay

## 2021-01-19 ENCOUNTER — Ambulatory Visit (HOSPITAL_COMMUNITY)
Admission: RE | Admit: 2021-01-19 | Discharge: 2021-01-19 | Disposition: A | Discharge status: Home / Self Care | Payer: Medicare Other | Source: Ambulatory Visit | Attending: Internal Medicine | Admitting: Internal Medicine

## 2021-01-19 DIAGNOSIS — K7581 Nonalcoholic steatohepatitis (NASH): Secondary | ICD-10-CM | POA: Diagnosis not present

## 2021-01-19 DIAGNOSIS — K7460 Unspecified cirrhosis of liver: Secondary | ICD-10-CM | POA: Diagnosis present

## 2021-01-26 ENCOUNTER — Ambulatory Visit (HOSPITAL_COMMUNITY)
Admission: RE | Admit: 2021-01-26 | Discharge: 2021-01-26 | Disposition: A | Discharge status: Home / Self Care | Payer: Medicare Other | Source: Ambulatory Visit | Attending: Urology | Admitting: Urology

## 2021-01-26 ENCOUNTER — Other Ambulatory Visit: Payer: Self-pay

## 2021-01-26 DIAGNOSIS — N200 Calculus of kidney: Secondary | ICD-10-CM

## 2021-02-02 ENCOUNTER — Ambulatory Visit: Payer: Medicare Other | Admitting: Urology

## 2021-03-08 ENCOUNTER — Encounter: Payer: Self-pay | Admitting: Urology

## 2021-03-08 ENCOUNTER — Telehealth (INDEPENDENT_AMBULATORY_CARE_PROVIDER_SITE_OTHER): Payer: Medicare Other | Admitting: Urology

## 2021-03-08 ENCOUNTER — Other Ambulatory Visit: Payer: Self-pay

## 2021-03-08 DIAGNOSIS — N200 Calculus of kidney: Secondary | ICD-10-CM | POA: Diagnosis not present

## 2021-03-08 NOTE — Progress Notes (Signed)
03/08/2021 1:45 PM   Yolanda [name] 1956-03-09 [id number]  Referring provider: Sharilyn [name], Yolanda [name],  Yolanda [name] [id number]  Patient location: home Physician location: office I connected with  Larena Sox on 03/08/21 by a video enabled telemedicine application and verified that I am speaking with the correct person using two identifiers.   I discussed the limitations of evaluation and management by telemedicine. The patient expressed understanding and agreed to proceed.    Followup nephrolithiasis   HPI: [name] is a 30TF here for followup for nephrolithiasis. Renal US from 01/2021 showed no definitive calculi and no hydronephrosis. She passed a calculus 2 months ago. No flank pain. She is drinking 64oz of water daily. She is flavoring her water lime. No other complaints today.    PMH: Past Medical History:  Diagnosis Date   Abnormal uterine bleeding    Anxiety    Arthritis    Calculus of kidney 07/09/2008   Qualifier: Diagnosis of  By: Kellie Simmering LPN, Almyra Free     Chronic abdominal pain    Chronic pain in left foot    Clotting disorder ([place])    Depression    Elevated liver function tests 2018   Endometriosis    Family history of adverse reaction to anesthesia    Fatty liver    Fibromyalgia    Gastritis    GERD 12/21/2009   Qualifier: Diagnosis of  By: Craige Cotta     History of kidney stones    Hypertension    stopped meds in Aug 2015   Hypothyroidism    IBS (irritable bowel syndrome)    Internal hemorrhoids    Kidney stone    Obesity, morbid ([place]) 09/11/2012   PE 07/09/2008   Qualifier: Diagnosis of  By: Kellie Simmering LPN, Almyra Free     PE (pulmonary embolism)    PONV (postoperative nausea and vomiting)    Sleep apnea    Thyroid disease    Vitamin D deficiency     Surgical History: Past Surgical History:  Procedure Laterality Date   ABDOMINAL SURGERY     laparoscopy   BIOPSY N/A 03/24/2014   Procedure: GASTRIC BIOPSIES;  Surgeon: Danie Binder, MD;  Location: [facility];  Service: Endoscopy;  Laterality: N/A;   BIOPSY  10/28/2019   Procedure: BIOPSY;  Surgeon: Danie Binder, MD;  [facility]: [facility];  Service: Endoscopy;;  gastric nodule   BREAST LUMPECTOMY Right    CESAREAN SECTION     X2   COLONOSCOPY  2006   internal hemorrhoids   COLONOSCOPY WITH PROPOFOL N/A 03/24/2014   Dr. [name]: 2 tubular adenomas removed, hemorrhoids   COLONOSCOPY WITH PROPOFOL N/A 10/28/2019   Fields: External and internal hemorrhoids, 8 polyps ranging from 2 to 5 mm in size removed from the colon.  Multiple tubular adenomas.  Next colonoscopy in 3 years.   CYSTOSCOPY W/ RETROGRADES  01/23/2012   Procedure: CYSTOSCOPY WITH RETROGRADE PYELOGRAM;  Surgeon: Marissa Nestle, MD;  Location: [facility];  Service: Urology;  Laterality: Left;   DILATATION & CURETTAGE/HYSTEROSCOPY WITH MYOSURE N/A 11/18/2014   Procedure: DILATATION & CURETTAGE/HYSTEROSCOPY WITH MYOSURE, resection of polyp;  Surgeon: Cheri Fowler, MD;  Location: [facility];  Service: Gynecology;  Laterality: N/A;   DILATION AND CURETTAGE OF UTERUS     ESOPHAGOGASTRODUODENOSCOPY   08/24/2006   Dr. Veto Kemps erythema of the antrum without erosion or ulcers/Otherwise, normal esophagus without evidence of Barrett's path with chronic gastritis   ESOPHAGOGASTRODUODENOSCOPY (EGD) WITH  PROPOFOL N/A 03/24/2014   Dr. [name]: gastritis   ESOPHAGOGASTRODUODENOSCOPY (EGD) WITH PROPOFOL N/A 10/28/2019   Fields: Esophagus appeared normal, empiric dilation due to history of dysphagia.  6 mm nodule seen in the gastric cardia.  Mild portal hypertensive gastropathy.  Nodule from the stomach biopsied and showed mild chronic gastritis, no H. pylori.   POLYPECTOMY N/A 03/24/2014   Procedure: POLYPECTOMY;  Surgeon: Danie Binder, MD;  Location: [facility];  Service: Endoscopy;  Laterality: N/A;   POLYPECTOMY  10/28/2019   Procedure: POLYPECTOMY;  Surgeon: Danie Binder, MD;  [facility];  Service:  Endoscopy;;  hepatic flexure, ascending colon,sigmoid colon, rectal   REMOVAL OF STONES  01/23/2012   Procedure: REMOVAL OF STONES;  Surgeon: Marissa Nestle, MD;  Location: [facility];  Service: Urology;  Laterality: N/A;   SAVORY DILATION N/A 10/28/2019   Procedure: SAVORY DILATION;  Surgeon: Danie Binder, MD;  [facility];  Service: Endoscopy;  Laterality: N/A;   TUBAL LIGATION      Home Medications:  Allergies as of 03/08/2021       Reactions   Azithromycin Hives   Lisinopril Cough   Sulfonamide Derivatives Other (See Comments)   Vaginal Infection         Medication List        Accurate as of March 08, 2021  1:45 PM. If you have any questions, ask your nurse or doctor.          acetaminophen 500 MG tablet Commonly known as: TYLENOL Take 500-1,000 mg by mouth every 6 (six) hours as needed for moderate pain or headache.   albuterol 108 (90 Base) MCG/ACT inhaler Commonly known as: VENTOLIN HFA Inhale 1-2 puffs into the lungs every 6 (six) hours as needed for wheezing or shortness of breath.   aspirin EC 81 MG tablet Take 81 mg by mouth daily.   bisacodyl 5 MG EC tablet Generic drug: bisacodyl Take 10 mg by mouth daily as needed.   clobetasol ointment 0.05 % Commonly known as: TEMOVATE Apply 1 application topically every evening. Apply to lower legs   CRANBERRY-VITAMIN C PO Take 1 tablet by mouth daily.   cyclobenzaprine 10 MG tablet Commonly known as: FLEXERIL Take 10 mg by mouth daily.   DULoxetine 30 MG capsule Commonly known as: CYMBALTA Take 30 mg by mouth daily.   Fish Oil 1000 MG Caps Take 1,000 mg by mouth daily.   fluocinonide 0.05 % external solution Commonly known as: LIDEX Apply 1 application topically See admin instructions. Apply topically every 4 days   hydrocortisone 2.5 % cream Apply 1 application topically as needed.   ketoconazole 2 % cream Commonly known as: NIZORAL Apply 1 application topically daily as needed.    Levothyroxine Sodium 100 MCG Caps Take 100 mcg by mouth daily before breakfast.   meclizine 25 MG tablet Commonly known as: ANTIVERT Take 25 mg by mouth 3 (three) times daily as needed for dizziness.   Melatonin 10 MG Caps Take 10 mg by mouth at bedtime as needed (sleep).   naproxen sodium 220 MG tablet Commonly known as: ALEVE Take 220 mg by mouth daily as needed (pain).   olmesartan-hydrochlorothiazide 40-25 MG tablet Commonly known as: BENICAR HCT Take 1 tablet by mouth daily.   omeprazole 20 MG capsule Commonly known as: PRILOSEC 1 po 30 mins prior to [place]. What changed:  how much to take how to take this when to take this additional instructions  rosuvastatin 10 MG tablet Commonly known as: CRESTOR Take 10 mg by mouth daily.   [place] ARIA IV Inject into the vein. Every 2 months   Turmeric 500 MG Tabs Take 500 mg by mouth daily.   Vitamin D3 50 MCG (2000 UT) Tabs Take 2,000 Units by mouth daily.        Allergies:  Allergies  Allergen Reactions   Azithromycin Hives   Lisinopril Cough   Sulfonamide Derivatives Other (See Comments)    Vaginal Infection     Family History: Family History  Problem Relation Age of Onset   Heart attack Mother        CABG   Stroke Mother    Hypertension Mother    Cancer Mother    Diabetes Mother    Heart attack Father        CABG   Hypertension Father    Mesothelioma Father    Heart attack Brother    Diabetes Brother    Depression Brother    Hyperlipidemia Brother    Diabetes Maternal Grandmother    Diabetes Maternal Grandfather    Cancer Paternal Grandmother    Diabetes Brother    Hyperlipidemia Brother    Wilson's disease Other        2 nieces, one died in her 52s   Colon cancer Neg Hx     Social History:  reports that she quit smoking about 22 years ago. Her smoking use included cigarettes. She has never used smokeless tobacco. She reports that she does not currently use alcohol. She reports  that she does not use drugs.  ROS: All other review of systems were reviewed and are negative except what is noted above in HPI   Laboratory Data: Lab Results  Component Value Date   WBC 8.1 12/01/2019   HGB 15.7 12/01/2019   HCT 45.9 12/01/2019   MCV 92 12/01/2019   PLT 195 12/01/2019    Lab Results  Component Value Date   CREATININE 0.59 07/08/2020    No results found for: PSA  No results found for: TESTOSTERONE  No results found for: HGBA1C  Urinalysis    Component Value Date/Time   COLORURINE YELLOW 04/15/2015 0028   APPEARANCEUR Clear 08/02/2020 1347   LABSPEC 1.020 04/15/2015 0028   PHURINE 6.0 04/15/2015 0028   GLUCOSEU Negative 08/02/2020 1347   HGBUR LARGE (A) 04/15/2015 0028   BILIRUBINUR Negative 08/02/2020 1347   KETONESUR TRACE (A) 04/15/2015 0028   PROTEINUR Negative 08/02/2020 1347   PROTEINUR NEGATIVE 04/15/2015 0028   UROBILINOGEN negative (A) 07/02/2019 0932   UROBILINOGEN 0.2 04/15/2015 0028   NITRITE Negative 08/02/2020 1347   NITRITE NEGATIVE 04/15/2015 0028   LEUKOCYTESUR Negative 08/02/2020 1347    Lab Results  Component Value Date   LABMICR See below: 08/02/2020   WBCUA 0-5 08/02/2020   RBCUA 0-2 03/07/2017   LABEPIT 0-10 08/02/2020   MUCUS Present 03/07/2017   BACTERIA Few (A) 08/02/2020    Pertinent Imaging: Renal US 01/2021: Images reviewed and discussed with the patient  No results found for this or any previous visit.  No results found for this or any previous visit.  No results found for this or any previous visit.  No results found for this or any previous visit.  Results for orders placed during the hospital encounter of 01/26/21  Ultrasound renal complete  Narrative CLINICAL DATA:  Nephrolithiasis  EXAM: RENAL / URINARY TRACT ULTRASOUND COMPLETE  COMPARISON:  Renal sonogram 06/18/2020  FINDINGS: The examination is  significantly limited by the patient's body habitus as well as overlying bowel gas.  Right  Kidney:  Renal measurements: 13.4 x 6.8 x 5.8 cm = volume: 278 mL. Echogenicity within normal limits. No mass or hydronephrosis visualized. There is limited evaluation of the upper pole the right kidney. No definite intrarenal calculi are identified.  Left Kidney:  Renal measurements: 11.9 x 5.8 x 5.3 cm = volume: 194 mL. Echogenicity within normal limits. No mass or hydronephrosis visualized. There is limited evaluation of the upper pole of the left kidney. No definite intrarenal calculi are identified.  Bladder:  Appears normal for degree of bladder distention.  Other:  None.  IMPRESSION: Markedly limited examination due to overlying bowel gas as well as the patient's body habitus. No definite nephrolithiasis identified. No hydronephrosis.   Electronically Signed By: Fidela Salisbury MD On: 01/27/2021 02:46  No results found for this or any previous visit.  No results found for this or any previous visit.  Results for orders placed during the hospital encounter of 09/27/17  CT RENAL STONE STUDY  Narrative CLINICAL DATA:  Bilateral flank pain for 1 month.  EXAM: CT ABDOMEN AND PELVIS WITHOUT CONTRAST  TECHNIQUE: Multidetector CT imaging of the abdomen and pelvis was performed following the standard protocol without IV contrast.  COMPARISON:  CT scan 04/24/2016  FINDINGS: Lower chest: The lung bases are clear of acute process. Peripheral interstitial changes appear stable and likely related to interstitial lung disease. No pleural effusion or focal pulmonary lesion/nodule. The heart is upper limits of normal in size for age. Prominent epicardial and pericardial fat. Mild lipomatosis hypertrophy of the interatrial septum. The distal esophagus is grossly normal.  Hepatobiliary: Progressive cirrhotic changes involving the liver. Very irregular liver contour with increased caudate to right lobe ratio, dilated hepatic fissures and evidence of portal  venous hypertension and portal venous collaterals. No splenomegaly. No ascites.  No focal hepatic lesions or intrahepatic biliary dilatation. The gallbladder is grossly normal. No common bile duct dilatation.  Pancreas: No mass, inflammation or ductal dilatation.  Spleen: Normal size.  No focal lesions.  Adrenals/Urinary Tract: The adrenal glands are unremarkable.  Two right-sided renal calculi. No left-sided renal calculi. No obstructing ureteral calculi or bladder calculi. No worrisome renal or bladder lesions without contrast.  Stomach/Bowel: The stomach, duodenum, small bowel and colon are grossly normal without oral contrast. No inflammatory changes, mass lesions or obstructive findings. The terminal ileum and appendix are normal.  Vascular/Lymphatic: The aorta is normal in caliber. No atheroscerlotic calcifications. No mesenteric of retroperitoneal mass or adenopathy. Small scattered lymph nodes are noted.  Reproductive: The uterus and ovaries are normal.  Other: No pelvic mass or adenopathy. No free pelvic fluid collections. No inguinal mass or adenopathy. No abdominal wall hernia or subcutaneous lesions.  Musculoskeletal: No significant bony findings. Mild degenerative changes involving both hips and the lumbar spine.  IMPRESSION: 1. Two right-sided renal calculi but no obstructing ureteral calculi or bladder calculi. No worrisome renal or bladder lesions without contrast. 2. No acute abdominal/pelvic findings, mass lesions or lymphadenopathy. 3. Progressive cirrhotic changes involving the liver as described above. No worrisome hepatic lesion, splenomegaly or ascites.   Electronically Signed By: Marijo Sanes M.D. On: 09/28/2017 10:37   Assessment & Plan:    1. Kidney stones -RTC 1 year with renal US   No follow-ups on file.  Nicolette Bang, MD  [facility] [place]

## 2021-03-08 NOTE — Patient Instructions (Signed)
Textbook of Natural Medicine (5th ed., pp. 1518-1527.e3). [place], [place]: Elsevier.">  Dietary Guidelines to Help Prevent Kidney Stones Kidney stones are deposits of minerals and salts that form inside your kidneys. Your risk of developing kidney stones may be greater depending on your diet, your lifestyle, the medicines you take, and whether you have certain medical conditions. Most people can lower their chances of developing kidney stones by following the instructions below. Your dietitian may give you more specific instructions depending on your overall health and the type of kidney stones youtend to develop. What are tips for following this plan? Reading food labels  Choose foods with "no salt added" or "low-salt" labels. Limit your salt (sodium) intake to less than 1,500 mg a day. Choose foods with calcium for each meal and snack. Try to eat about 300 mg of calcium at each meal. Foods that contain 200-500 mg of calcium a serving include: 8 oz (237 mL) of milk, calcium-fortifiednon-dairy milk, and calcium-fortifiedfruit juice. Calcium-fortified means that calcium has been added to these drinks. 8 oz (237 mL) of kefir, yogurt, and soy yogurt. 4 oz (114 g) of tofu. 1 oz (28 g) of cheese. 1 cup (150 g) of dried figs. 1 cup (91 g) of cooked broccoli. One 3 oz (85 g) can of sardines or mackerel. Most people need 1,000-1,500 mg of calcium a day. Talk to your dietitian abouthow much calcium is recommended for you. Shopping Buy plenty of fresh fruits and vegetables. Most people do not need to avoid fruits and vegetables, even if these foods contain nutrients that may contribute to kidney stones. When shopping for convenience foods, choose: Whole pieces of fruit. Pre-made salads with dressing on the side. Low-fat fruit and yogurt smoothies. Avoid buying frozen meals or prepared deli foods. These can be high in sodium. Look for foods with live cultures, such as yogurt and kefir. Choose high-fiber  grains, such as whole-wheat breads, oat bran, and wheat cereals. Cooking Do not add salt to food when cooking. Place a salt shaker on the table and allow each person to add his or her own salt to taste. Use vegetable protein, such as beans, textured vegetable protein (TVP), or tofu, instead of meat in pasta, casseroles, and soups. Meal planning Eat less salt, if told by your dietitian. To do this: Avoid eating processed or pre-made food. Avoid eating fast food. Eat less animal protein, including cheese, meat, poultry, or fish, if told by your dietitian. To do this: Limit the number of times you have meat, poultry, fish, or cheese each week. Eat a diet free of meat at least 2 days a week. Eat only one serving each day of meat, poultry, fish, or seafood. When you prepare animal protein, cut pieces into small portion sizes. For most meat and fish, one serving is about the size of the palm of your hand. Eat at least five servings of fresh fruits and vegetables each day. To do this: Keep fruits and vegetables on hand for snacks. Eat one piece of fruit or a handful of berries with breakfast. Have a salad and fruit at lunch. Have two kinds of vegetables at dinner. Limit foods that are high in a substance called oxalate. These include: Spinach (cooked), rhubarb, beets, sweet potatoes, and Swiss chard. Peanuts. Potato chips, french fries, and baked potatoes with skin on. Nuts and nut products. Chocolate. If you regularly take a diuretic medicine, make sure to eat at least 1 or 2 servings of fruits or vegetables that are   high in potassium each day. These include: Avocado. Banana. Orange, prune, carrot, or tomato juice. Baked potato. Cabbage. Beans and split peas. Lifestyle  Drink enough fluid to keep your urine pale yellow. This is the most important thing you can do. Spread your fluid intake throughout the day. If you drink alcohol: Limit how much you use to: 0-1 drink a day for women who  are not pregnant. 0-2 drinks a day for men. Be aware of how much alcohol is in your drink. In the [place] one drink equals one 12 oz bottle of beer (355 mL), one 5 oz glass of wine (148 mL), or one 1 oz glass of hard liquor (44 mL). Lose weight if told by your health care provider. Work with your dietitian to find an eating plan and weight loss strategies that work best for you.  General information Talk to your health care provider and dietitian about taking daily supplements. You may be told the following depending on your health and the cause of your kidney stones: Not to take supplements with vitamin C. To take a calcium supplement. To take a daily probiotic supplement. To take other supplements such as magnesium, fish oil, or vitamin B6. Take over-the-counter and prescription medicines only as told by your health care provider. These include supplements. What foods should I limit? Limit your intake of the following foods, or eat them as told by your dietitian. Vegetables Spinach. Rhubarb. Beets. Canned vegetables. Pickles. Olives. Baked potatoeswith skin. Grains Wheat bran. Baked goods. Salted crackers. Cereals high in sugar. Meats and other proteins Nuts. Nut butters. Large portions of meat, poultry, or fish. Salted, precooked,or cured meats, such as sausages, meat loaves, and hot dogs. Dairy Cheese. Beverages Regular soft drinks. Regular vegetable juice. Seasonings and condiments Seasoning blends with salt. Salad dressings. Soy sauce. Ketchup. Barbecue sauce. Other foods Canned soups. Canned pasta sauce. Casseroles. Pizza. Lasagna. Frozen meals.Potato chips. French fries. The items listed above may not be a complete list of foods and beverages you should limit. Contact a dietitian for more information. What foods should I avoid? Talk to your dietitian about specific foods you should avoid based on the typeof kidney stones you have and your overall health. Fruits Grapefruit. The  item listed above may not be a complete list of foods and beverages you should avoid. Contact a dietitian for more information. Summary Kidney stones are deposits of minerals and salts that form inside your kidneys. You can lower your risk of kidney stones by making changes to your diet. The most important thing you can do is drink enough fluid. Drink enough fluid to keep your urine pale yellow. Talk to your dietitian about how much calcium you should have each day, and eat less salt and animal protein as told by your dietitian. This information is not intended to replace advice given to you by your health care provider. Make sure you discuss any questions you have with your healthcare provider. Document Revised: 06/19/2019 Document Reviewed: 06/19/2019 Elsevier Patient Education  2022 [employer]  

## 2021-03-09 ENCOUNTER — Encounter: Payer: Self-pay | Admitting: Internal Medicine

## 2021-03-09 ENCOUNTER — Ambulatory Visit (INDEPENDENT_AMBULATORY_CARE_PROVIDER_SITE_OTHER): Payer: Medicare Other | Admitting: Internal Medicine

## 2021-03-09 VITALS — BP 139/79 | HR 88 | Temp 97.8°F | Ht 64.0 in | Wt 287.8 lb

## 2021-03-09 DIAGNOSIS — K219 Gastro-esophageal reflux disease without esophagitis: Secondary | ICD-10-CM | POA: Diagnosis not present

## 2021-03-09 DIAGNOSIS — K7460 Unspecified cirrhosis of liver: Secondary | ICD-10-CM | POA: Diagnosis not present

## 2021-03-09 DIAGNOSIS — K7581 Nonalcoholic steatohepatitis (NASH): Secondary | ICD-10-CM

## 2021-03-09 MED ORDER — OMEPRAZOLE 20 MG PO CPDR: 20.0000 mg | DELAYED_RELEASE_CAPSULE | Freq: Two times a day (BID) | ORAL | 3 refills | Status: DC

## 2021-03-09 NOTE — Patient Instructions (Signed)
In regards to your cirrhosis, you most recent blood work and ultrasound both look great.  We will plan on repeat blood work and ultrasound in 6 months.  I will refer you to speak with the bariatric surgeon in [place] to discuss if any potential options for weight loss surgery.  This may not be an option given your chronic liver disease.  For your worsening reflux, I will increase your omeprazole to 20 mg twice daily.  I want to take this for the next 12 weeks at which point you can decrease back to once daily if needed.  Otherwise follow-up in 6 months.  It was great seeing you again today.  Dr. Abbey Chatters  Nonalcoholic Fatty Liver Disease Diet, Adult Nonalcoholic fatty liver disease is a condition that causes fat to build up in and around the liver. The disease makes it harder for the liver to work the way that it should. Following a healthy diet can help to keep nonalcoholic fatty liver disease under control. It can also help to prevent or improve conditions that are associated with the disease, such as heart disease, diabetes, high blood pressure, and abnormal cholesterol levels. Along with regular exercise, this diet: Promotes weight loss. Helps to control blood sugar levels. Helps to improve the way that the body uses insulin. What are tips for following this plan? Reading food labels  Always check food labels for: The amount of saturated fat in a food. You should limit your intake of saturated fat. Saturated fat is found in foods that come from animals, including meat and dairy products such as butter, cheese, and whole milk. The amount of fiber in a food. You should choose high-fiber foods such as fruits, vegetables, and whole grains. Try to get 25-30 grams (g) of fiber a day.   Cooking When cooking, use heart-healthy oils that are high in monounsaturated fats. These include olive oil, canola oil, and avocado oil. Limit frying or deep-frying foods. Cook foods using healthy methods  such as baking, boiling, steaming, and grilling instead. Meal planning You may want to keep track of how many calories you take in. Eating the right amount of calories will help you achieve a healthy weight. Meeting with a registered dietitian can help you get started. Limit how often you eat takeout and fast food. These foods are usually very high in fat, salt, and sugar. Use the glycemic index (GI) to plan your meals. The index tells you how quickly a food will raise your blood sugar. Choose low-GI foods (GI less than 55). These foods take a longer time to raise blood sugar. A registered dietitian can help you identify foods lower on the GI scale. Lifestyle You may want to follow a Mediterranean diet. This diet includes a lot of vegetables, lean meats or fish, whole grains, fruits, and healthy oils and fats. What foods can I eat?    Fruits Bananas. Apples. Oranges. Grapes. Papaya. Mango. Pomegranate. Kiwi. Grapefruit. Cherries. Vegetables Lettuce. Spinach. Peas. Beets. Cauliflower. Cabbage. Broccoli. Carrots. Tomatoes. Squash. Eggplant. Herbs. Peppers. Onions. Cucumbers. Brussels sprouts. Yams and sweet potatoes. Beans. Lentils. Grains Whole wheat or whole-grain foods, including breads, crackers, cereals, and pasta. Stone-ground whole wheat. Unsweetened oatmeal. Bulgur. Barley. Quinoa. Brown or wild rice. Corn or whole wheat flour tortillas. Meats and other proteins Lean meats. Poultry. Tofu. Seafood and shellfish. Dairy Low-fat or fat-free dairy products, such as yogurt, cottage cheese, or cheese. Beverages Water. Sugar-free drinks. Tea. Coffee. Low-fat or skim milk. Milk alternatives, such as soy or  almond milk. Real fruit juice. Fats and oils Avocado. Canola or olive oil. Nuts and nut butters. Seeds. Seasonings and condiments Mustard. Relish. Low-fat, low-sugar ketchup and barbecue sauce. Low-fat or fat-free mayonnaise. Sweets and desserts Sugar-free sweets. The items listed above  may not be a complete list of foods and beverages you can eat. Contact a dietitian for more information. What foods should I limit or avoid? Meats and other proteins Limit red meat to 1-2 times a week. Dairy [employer]. Fats and oils Palm oil and coconut oil. Fried foods. Other foods Processed foods. Foods that contain a lot of salt or sodium. Sweets and desserts Sweets that contain sugar. Beverages Sweetened drinks, such as sweet tea, milkshakes, iced sweet drinks, and sodas. Alcohol. The items listed above may not be a complete list of foods and beverages you should avoid. Contact a dietitian for more information. Where to find more information The [employer] of Diabetes and Digestive and Kidney Diseases: [employer] Summary Nonalcoholic fatty liver disease is a condition that causes fat to build up in and around the liver. Following a healthy diet can help to keep nonalcoholic fatty liver disease under control. Your diet should be rich in fruits, vegetables, whole grains, and lean proteins. Limit your intake of saturated fat. Saturated fat is found in foods that come from animals, including meat and dairy products such as butter, cheese, and whole milk. This diet promotes weight loss, helps to control blood sugar levels, and helps to improve the way that the body uses insulin. This information is not intended to replace advice given to you by your health care provider. Make sure you discuss any questions you have with your health care provider. Document Revised: 10/18/2018 Document Reviewed: 07/18/2018 Elsevier Patient Education  [place] and home remedies TO MANAGE REFLUX/HEARTBURN    You may eliminate or reduce the frequency of heartburn by making the following lifestyle changes:   Control your weight. Being overweight is a major risk factor for heartburn and GERD. Excess pounds put pressure on your abdomen, pushing up your stomach and causing  acid to back up into your esophagus.    Eat smaller meals. 4 TO 6 MEALS A DAY. This reduces pressure on the lower esophageal sphincter, helping to prevent the valve from opening and acid from washing back into your esophagus.     Loosen your belt. Clothes that fit tightly around your waist put pressure on your abdomen and the lower esophageal sphincter.     Eliminate heartburn triggers. Everyone has specific triggers. Common triggers such as fatty or fried foods, spicy food, tomato sauce, carbonated beverages, alcohol, chocolate, mint, garlic, onion, caffeine and nicotine may make heartburn worse.    Avoid stooping or bending. Tying your shoes is OK. Bending over for longer periods to weed your garden isn't, especially soon after eating.    Don't lie down after a meal. Wait at least three to four hours after eating before going to bed, and don't lie down right after eating.    At [facility] we value your feedback. You may receive a survey about your visit today. Please share your experience as we strive to create trusting relationships with our patients to provide genuine, compassionate, quality care.  We appreciate your understanding and patience as we review any laboratory studies, imaging, and other diagnostic tests that are ordered as we care for you. Our office policy is 5 business days for review of these results, and any emergent or  urgent results are addressed in a timely manner for your best interest. If you do not hear from our office in 1 week, please contact us.   We also encourage the use of MyChart, which contains your medical information for your review as well. If you are not enrolled in this feature, an access code is on this after visit summary for your convenience. Thank you for allowing [place] to be involved in your care.  It was great to see you today!  I hope you have a great rest of your summer!!    Elon Alas. Abbey Chatters, D.O. Gastroenterology and  Hepatology [facility]

## 2021-03-10 ENCOUNTER — Other Ambulatory Visit: Payer: Self-pay

## 2021-03-10 NOTE — Progress Notes (Unsigned)
B am

## 2021-03-10 NOTE — Progress Notes (Signed)
Referring Provider: Sharilyn Sites, MD Primary Care Physician:  Sharilyn Sites, MD Primary GI:  Dr. Abbey Chatters  Chief Complaint  Patient presents with   Cirrhosis    F/u   Gastroesophageal Reflux    Wakes up with bad taste in mouth    HPI:   Yolanda [name] is a 65 y.o. female who presents to the clinic today for follow-up visit regarding Yolanda [name] cirrhosis, well compensated.  Patient states she feels good.  Most recent MELD 7.  On previous visit, she lost nearly 30 pounds but has unfortunately put some of this weight back on.  She attributes to a recent course of steroids for her rheumatological issues.  She is started back on her diet and has lost a few pounds.  Denies any swelling or abdominal distention.  No history of ascites.    EGD 10/28/2019 showed no evidence of esophageal varices.  Colonoscopy at the same time did show multiple polyps which were tubular adenomas on pathology.  Last right upper quadrant ultrasound 01/19/2021 without evidence of HCC.  No history of hepatic encephalopathy.  GERD maintained on omeprazole 20 mg daily.  She states this controls her for the most part though she notes worsening nighttime symptoms while sleeping.  Past Medical History:  Diagnosis Date   Abnormal uterine bleeding    Anxiety    Arthritis    Calculus of kidney 07/09/2008   Qualifier: Diagnosis of  By: Kellie Simmering LPN, Almyra Free     Chronic abdominal pain    Chronic pain in left foot    Clotting disorder ([place])    Depression    Elevated liver function tests 2018   Endometriosis    Family history of adverse reaction to anesthesia    Fatty liver    Fibromyalgia    Gastritis    GERD 12/21/2009   Qualifier: Diagnosis of  By: Craige Cotta     History of kidney stones    Hypertension    stopped meds in Aug 2015   Hypothyroidism    IBS (irritable bowel syndrome)    Internal hemorrhoids    Kidney stone    Obesity, morbid ([place]) 09/11/2012   PE 07/09/2008   Qualifier: Diagnosis of  By:  Kellie Simmering LPN, Almyra Free     PE (pulmonary embolism)    PONV (postoperative nausea and vomiting)    Sleep apnea    Thyroid disease    Vitamin D deficiency     Past Surgical History:  Procedure Laterality Date   ABDOMINAL SURGERY     laparoscopy   BIOPSY N/A 03/24/2014   Procedure: GASTRIC BIOPSIES;  Surgeon: Danie Binder, MD;  Location: [facility];  Service: Endoscopy;  Laterality: N/A;   BIOPSY  10/28/2019   Procedure: BIOPSY;  Surgeon: Danie Binder, MD;  Location: [facility];  Service: Endoscopy;;  gastric nodule   BREAST LUMPECTOMY Right    CESAREAN SECTION     X2   COLONOSCOPY  2006   internal hemorrhoids   COLONOSCOPY WITH PROPOFOL N/A 03/24/2014   Dr. [name]: 2 tubular adenomas removed, hemorrhoids   COLONOSCOPY WITH PROPOFOL N/A 10/28/2019   Fields: External and internal hemorrhoids, 8 polyps ranging from 2 to 5 mm in size removed from the colon.  Multiple tubular adenomas.  Next colonoscopy in 3 years.   CYSTOSCOPY W/ RETROGRADES  01/23/2012   Procedure: CYSTOSCOPY WITH RETROGRADE PYELOGRAM;  Surgeon: Marissa Nestle, MD;  Location: [facility];  Service: Urology;  Laterality: Left;   DILATATION &  CURETTAGE/HYSTEROSCOPY WITH MYOSURE N/A 11/18/2014   Procedure: DILATATION & CURETTAGE/HYSTEROSCOPY WITH MYOSURE, resection of polyp;  Surgeon: Cheri Fowler, MD;  Location: [place] [facility];  Service: Gynecology;  Laterality: N/A;   DILATION AND CURETTAGE OF UTERUS     ESOPHAGOGASTRODUODENOSCOPY   08/24/2006   Dr. Veto Kemps erythema of the antrum without erosion or ulcers/Otherwise, normal esophagus without evidence of Barrett's path with chronic gastritis   ESOPHAGOGASTRODUODENOSCOPY (EGD) WITH PROPOFOL N/A 03/24/2014   Dr. [name]: gastritis   ESOPHAGOGASTRODUODENOSCOPY (EGD) WITH PROPOFOL N/A 10/28/2019   Fields: Esophagus appeared normal, empiric dilation due to history of dysphagia.  6 mm nodule seen in the gastric cardia.  Mild portal hypertensive gastropathy.  Nodule from the stomach  biopsied and showed mild chronic gastritis, no H. pylori.   POLYPECTOMY N/A 03/24/2014   Procedure: POLYPECTOMY;  Surgeon: Danie Binder, MD;  Location: [facility];  Service: Endoscopy;  Laterality: N/A;   POLYPECTOMY  10/28/2019   Procedure: POLYPECTOMY;  Surgeon: Danie Binder, MD;  [facility];  Service: Endoscopy;;  hepatic flexure, ascending colon,sigmoid colon, rectal   REMOVAL OF STONES  01/23/2012   Procedure: REMOVAL OF STONES;  Surgeon: Marissa Nestle, MD;  Location: [facility];  Service: Urology;  Laterality: N/A;   SAVORY DILATION N/A 10/28/2019   Procedure: SAVORY DILATION;  Surgeon: Danie Binder, MD;  Location: [facility];  Service: Endoscopy;  Laterality: N/A;   TUBAL LIGATION      Current Outpatient Medications  Medication Sig Dispense Refill   acetaminophen (TYLENOL) 500 MG tablet Take 500-1,000 mg by mouth every 6 (six) hours as needed for moderate pain or headache.     albuterol (VENTOLIN HFA) 108 (90 Base) MCG/ACT inhaler Inhale 1-2 puffs into the lungs every 6 (six) hours as needed for wheezing or shortness of breath. 6.7 g 0   aspirin EC 81 MG tablet Take 81 mg by mouth daily.     bisacodyl (BISACODYL) 5 MG EC tablet Take 10 mg by mouth daily as needed.     Cholecalciferol (VITAMIN D3) 2000 units TABS Take 2,000 Units by mouth daily.      clobetasol ointment (TEMOVATE) 5.59 % Apply 1 application topically every evening. Apply to lower legs     CRANBERRY-VITAMIN C PO Take 1 tablet by mouth daily.      cyclobenzaprine (FLEXERIL) 10 MG tablet Take 10 mg by mouth daily.     DULoxetine (CYMBALTA) 30 MG capsule Take 30 mg by mouth daily.      fluocinonide (LIDEX) 0.05 % external solution Apply 1 application topically See admin instructions. Apply topically every 4 days     hydrocortisone 2.5 % cream Apply 1 application topically as needed.     inFLIXimab (REMICADE IV) Inject into the vein. As directed     ketoconazole (NIZORAL) 2 % cream Apply 1 application  topically daily as needed.     Levothyroxine Sodium 100 MCG CAPS Take 100 mcg by mouth daily before breakfast.      meclizine (ANTIVERT) 25 MG tablet Take 25 mg by mouth 3 (three) times daily as needed for dizziness.     Melatonin 10 MG CAPS Take 10 mg by mouth at bedtime as needed (sleep).     naproxen sodium (ALEVE) 220 MG tablet Take 220 mg by mouth daily as needed (pain).     olmesartan-hydrochlorothiazide (BENICAR HCT) 40-25 MG tablet Take 1 tablet by mouth daily.     Omega-3 Fatty Acids (FISH OIL) 1000 MG CAPS Take  1,000 mg by mouth daily.      rosuvastatin (CRESTOR) 10 MG tablet Take 10 mg by mouth daily.     Turmeric 500 MG TABS Take 500 mg by mouth daily.      omeprazole (PRILOSEC) 20 MG capsule Take 1 capsule (20 mg total) by mouth 2 (two) times daily before a meal. 1 po 30 mins prior to [place]. 180 capsule 3   No current facility-administered medications for this visit.    Allergies as of 03/09/2021 - Review Complete 03/09/2021  Allergen Reaction Noted   Azithromycin Hives    Lisinopril Cough 04/19/2017   Sulfonamide derivatives Other (See Comments)     Family History  Problem Relation Age of Onset   Heart attack Mother        CABG   Stroke Mother    Hypertension Mother    Cancer Mother    Diabetes Mother    Heart attack Father        CABG   Hypertension Father    Mesothelioma Father    Heart attack Brother    Diabetes Brother    Depression Brother    Hyperlipidemia Brother    Diabetes Maternal Grandmother    Diabetes Maternal Grandfather    Cancer Paternal Grandmother    Diabetes Brother    Hyperlipidemia Brother    Wilson's disease Other        2 nieces, one died in her 60s   Colon cancer Neg Hx     Social History   Socioeconomic History   Marital status: Divorced    Spouse name: Not on file   Number of children: 2   Years of education: 18   Highest education level: Not on file  Occupational History   Occupation: Writer: [employer]  Tobacco Use   Smoking status: Former    Years: 20.00    Types: Cigarettes    Quit date: 10/24/1998    Years since quitting: 22.3   Smokeless tobacco: Never   Tobacco comments:    1 cig daily  Vaping Use   Vaping Use: Never used  Substance and Sexual Activity   Alcohol use: Not Currently    Comment: "no alcohol in years"   Drug use: No   Sexual activity: Never    Birth control/protection: Post-menopausal  Other Topics Concern   Not on file  Social History Narrative   Right handed   One story with a basement   Drinks caffeine   Social Determinants of Health   Financial Resource Strain: Not on file  Food Insecurity: Not on file  Transportation Needs: Not on file  Physical Activity: Not on file  Stress: Not on file  Social Connections: Not on file    Subjective: Review of Systems  Constitutional:  Negative for chills and fever.  HENT:  Negative for congestion and hearing loss.   Eyes:  Negative for blurred vision and double vision.  Respiratory:  Negative for cough and shortness of breath.   Cardiovascular:  Negative for chest pain and palpitations.  Gastrointestinal:  Negative for abdominal pain, blood in stool, constipation, diarrhea, heartburn, melena and vomiting.  Genitourinary:  Negative for dysuria and urgency.  Musculoskeletal:  Negative for joint pain and myalgias.  Skin:  Negative for itching and rash.  Neurological:  Negative for dizziness and headaches.  Psychiatric/Behavioral:  Negative for depression. The patient is not nervous/anxious.     Objective: BP 139/79   Pulse  88   Temp 97.8 F (36.6 C)   Ht 5' 4"  (1.626 m)   Wt 287 lb 12.8 oz (130.5 kg)   LMP 07/11/2007 (Approximate)   BMI 49.40 kg/m  Physical Exam Constitutional:      Appearance: Normal appearance.  HENT:     Head: Normocephalic and atraumatic.  Eyes:     Extraocular Movements: Extraocular movements intact.     Conjunctiva/sclera: Conjunctivae  normal.  Cardiovascular:     Rate and Rhythm: Normal rate and regular rhythm.  Pulmonary:     Effort: Pulmonary effort is normal.     Breath sounds: Normal breath sounds.  Abdominal:     General: Bowel sounds are normal.     Palpations: Abdomen is soft.  Musculoskeletal:        General: No swelling. Normal range of motion.     Cervical back: Normal range of motion and neck supple.  Skin:    General: Skin is warm and dry.     Coloration: Skin is not jaundiced.  Neurological:     General: No focal deficit present.     Mental Status: She is alert and oriented to person, place, and time.  Psychiatric:        Mood and Affect: Mood normal.        Behavior: Behavior normal.     Assessment: *Cirrhosis-due to Yolanda [name], well compensated *Obesity *Constipation *GERD-relatively well-controlled on omeprazole 20 mg daily, though having breakthrough night symptoms  Plan: Patient cirrhosis due to [place], well compensated.  Most recent meld of 7.  Ultrasound 01/19/2021 without hepatoma.  Repeat in 6 months with AFP  EGD 10/28/2019 showed no evidence of esophageal varices.  We will plan on repeat in 2023 or sooner if decompensating event.  No history of hepatic encephalopathy  Counseled on high-protein low-sodium diet  Patient is back on her weight loss plan.  She is inquiring about potential bariatric surgery.  I told her given her chronic liver disease, surgery may not be an option, though she is well compensated without ascites.  She is requesting referral to bariatric surgeon to see if there are any options available to her.  I will make that referral today.  For her breakthrough GERD symptoms, I will increase her omeprazole to 20 mg twice daily for the next 12 weeks.  She can go back to daily dosing after that if able.  Counseled on weight loss as above.  Otherwise follow-up in 6 months  03/10/2021 11:09 AM   Disclaimer: This note was dictated with voice recognition software. Similar  sounding words can inadvertently be transcribed and may not be corrected upon review.

## 2021-08-04 ENCOUNTER — Telehealth: Payer: Self-pay | Admitting: Internal Medicine

## 2021-08-04 NOTE — Telephone Encounter (Signed)
Recall sent 

## 2021-08-04 NOTE — Telephone Encounter (Signed)
PATIENT ON RECALL FOR ULTRASOUND

## 2021-09-12 ENCOUNTER — Encounter: Payer: Self-pay | Admitting: Internal Medicine

## 2021-09-15 ENCOUNTER — Emergency Department (HOSPITAL_COMMUNITY): Payer: Medicare Other

## 2021-09-15 ENCOUNTER — Emergency Department (HOSPITAL_COMMUNITY)
Admission: EM | Admit: 2021-09-15 | Discharge: 2021-09-15 | Disposition: A | Discharge status: Home / Self Care | Payer: Medicare Other | Attending: Emergency Medicine | Admitting: Emergency Medicine

## 2021-09-15 DIAGNOSIS — R0602 Shortness of breath: Secondary | ICD-10-CM | POA: Diagnosis not present

## 2021-09-15 DIAGNOSIS — E871 Hypo-osmolality and hyponatremia: Secondary | ICD-10-CM | POA: Diagnosis not present

## 2021-09-15 DIAGNOSIS — R197 Diarrhea, unspecified: Secondary | ICD-10-CM | POA: Insufficient documentation

## 2021-09-15 DIAGNOSIS — J40 Bronchitis, not specified as acute or chronic: Secondary | ICD-10-CM | POA: Diagnosis not present

## 2021-09-15 DIAGNOSIS — R059 Cough, unspecified: Secondary | ICD-10-CM | POA: Diagnosis present

## 2021-09-15 DIAGNOSIS — E876 Hypokalemia: Secondary | ICD-10-CM | POA: Insufficient documentation

## 2021-09-15 DIAGNOSIS — Z7982 Long term (current) use of aspirin: Secondary | ICD-10-CM | POA: Diagnosis not present

## 2021-09-15 DIAGNOSIS — J069 Acute upper respiratory infection, unspecified: Secondary | ICD-10-CM | POA: Diagnosis not present

## 2021-09-15 LAB — CBC WITH DIFFERENTIAL/PLATELET
Abs Immature Granulocytes: 0.02 10*3/uL (ref 0.00–0.07)
Basophils Absolute: 0.1 10*3/uL (ref 0.0–0.1)
Basophils Relative: 1 %
Eosinophils Absolute: 0 10*3/uL (ref 0.0–0.5)
Eosinophils Relative: 0 %
HCT: 39.7 % (ref 36.0–46.0)
Hemoglobin: 13.3 g/dL (ref 12.0–15.0)
Immature Granulocytes: 0 %
Lymphocytes Relative: 28 %
Lymphs Abs: 1.9 10*3/uL (ref 0.7–4.0)
MCH: 33.6 pg (ref 26.0–34.0)
MCHC: 33.5 g/dL (ref 30.0–36.0)
MCV: 100.3 fL — ABNORMAL HIGH (ref 80.0–100.0)
Monocytes Absolute: 0.9 10*3/uL (ref 0.1–1.0)
Monocytes Relative: 13 %
Neutro Abs: 4 10*3/uL (ref 1.7–7.7)
Neutrophils Relative %: 58 %
Platelets: 112 10*3/uL — ABNORMAL LOW (ref 150–400)
RBC: 3.96 MIL/uL (ref 3.87–5.11)
RDW: 15.1 % (ref 11.5–15.5)
WBC Morphology: REACTIVE
WBC: 6.9 10*3/uL (ref 4.0–10.5)
nRBC: 0 % (ref 0.0–0.2)

## 2021-09-15 LAB — COMPREHENSIVE METABOLIC PANEL
ALT: 32 U/L (ref 0–44)
AST: 48 U/L — ABNORMAL HIGH (ref 15–41)
Albumin: 2.1 g/dL — ABNORMAL LOW (ref 3.5–5.0)
Alkaline Phosphatase: 83 U/L (ref 38–126)
Anion gap: 4 — ABNORMAL LOW (ref 5–15)
BUN: 10 mg/dL (ref 8–23)
CO2: 28 mmol/L (ref 22–32)
Calcium: 7.3 mg/dL — ABNORMAL LOW (ref 8.9–10.3)
Chloride: 102 mmol/L (ref 98–111)
Creatinine, Ser: 0.58 mg/dL (ref 0.44–1.00)
GFR, Estimated: >60 mL/min (ref >60)
Glucose, Bld: 94 mg/dL (ref 70–99)
Potassium: 3.4 mmol/L — ABNORMAL LOW (ref 3.5–5.1)
Sodium: 134 mmol/L — ABNORMAL LOW (ref 135–145)
Total Bilirubin: 1.6 mg/dL — ABNORMAL HIGH (ref 0.3–1.2)
Total Protein: 7.7 g/dL (ref 6.5–8.1)

## 2021-09-15 MED ORDER — ALBUTEROL SULFATE HFA 108 (90 BASE) MCG/ACT IN AERS
2.0000 | INHALATION_SPRAY | Freq: Once | RESPIRATORY_TRACT | Status: AC
  Administered 2021-09-15: 14:00:00 2 via RESPIRATORY_TRACT
  Filled 2021-09-15: qty 6.7

## 2021-09-15 MED ORDER — METHYLPREDNISOLONE 4 MG PO TBPK: ORAL_TABLET | ORAL | 0 refills | Status: DC

## 2021-09-15 MED ORDER — SODIUM CHLORIDE 0.9 % IV BOLUS
1000.0000 mL | Freq: Once | INTRAVENOUS | Status: AC
  Administered 2021-09-15: 13:00:00 1000 mL via INTRAVENOUS

## 2021-09-15 MED ORDER — ALBUTEROL SULFATE (2.5 MG/3ML) 0.083% IN NEBU
2.5000 mg | INHALATION_SOLUTION | Freq: Once | RESPIRATORY_TRACT | Status: AC
  Administered 2021-09-15: 15:00:00 2.5 mg via RESPIRATORY_TRACT
  Filled 2021-09-15: qty 3

## 2021-09-15 MED ORDER — BENZONATATE 100 MG PO CAPS: 100.0000 mg | ORAL_CAPSULE | Freq: Three times a day (TID) | ORAL | 0 refills | Status: DC

## 2021-09-15 MED ORDER — ALBUTEROL SULFATE (2.5 MG/3ML) 0.083% IN NEBU
10.0000 mg | INHALATION_SOLUTION | Freq: Once | RESPIRATORY_TRACT | Status: AC
  Administered 2021-09-15: 17:00:00 10 mg via RESPIRATORY_TRACT
  Filled 2021-09-15: qty 12

## 2021-09-15 MED ORDER — ALBUTEROL SULFATE (2.5 MG/3ML) 0.083% IN NEBU: 2.5000 mg | INHALATION_SOLUTION | Freq: Once | RESPIRATORY_TRACT | Status: DC

## 2021-09-15 NOTE — Discharge Instructions (Addendum)
You were seen in the emergency department today for cough. ? ?We have given you several breathing treatments, and inhaler to take for home.  I am also prescribing you a course of steroids and a cough medicine. ? ?Your chest x-ray today did not show findings consistent with pneumonia.  However you did have some vascular congestion, and I'd like you to follow-up with your primary care doctor about it. ?

## 2021-09-15 NOTE — ED Provider Notes (Signed)
[facility] Provider Note   CSN: [id number] Arrival date & time: 09/15/21  1021     History  Chief Complaint  Patient presents with   Cough    Yolanda [name] is a 66 y.o. female who presents emergency department complaining of cough for 1 week.  Patient states that for the past 2 days she has seen blood in her sputum after heavy coughing.  She was sent here for evaluation from her PCP for "lung scan".  She states she had 3 days of diarrhea, with too numerous to count episodes.  She do not see any blood in her diarrhea.  She not had any diarrhea since yesterday.  Patient has not been interested in eating.   Cough Associated symptoms: shortness of breath   Associated symptoms: no chest pain, no chills and no fever       Home Medications Prior to Admission medications   Medication Sig Start Date End Date Taking? Authorizing Provider  acetaminophen (TYLENOL) 500 MG tablet Take 500-1,000 mg by mouth every 6 (six) hours as needed for moderate pain or headache.   Yes [provider]  albuterol (VENTOLIN HFA) 108 (90 Base) MCG/ACT inhaler Inhale 1-2 puffs into the lungs every 6 (six) hours as needed for wheezing or shortness of breath. 11/08/19  Yes Long, Wonda Olds, MD  aspirin EC 81 MG tablet Take 81 mg by mouth daily.   Yes [provider]  benzonatate (TESSALON) 100 MG capsule Take 1 capsule (100 mg total) by mouth every 8 (eight) hours. 09/15/21  Yes Zynia Wojtowicz T, PA-C  bisacodyl (BISACODYL) 5 MG EC tablet Take 10 mg by mouth daily as needed.   Yes [provider]  Cholecalciferol (VITAMIN D3) 2000 units TABS Take 2,000 Units by mouth daily.    Yes [provider]  CRANBERRY-VITAMIN C PO Take 1 tablet by mouth daily.    Yes [provider]  cyclobenzaprine (FLEXERIL) 10 MG tablet Take 10 mg by mouth daily.   Yes [provider]  DULoxetine (CYMBALTA) 60 MG capsule Take 60 mg by mouth daily. 07/15/21  Yes  [provider]  inFLIXimab (REMICADE IV) Inject into the vein. As directed   Yes [provider]  Levothyroxine Sodium 100 MCG CAPS Take 100 mcg by mouth daily before breakfast.    Yes [provider]  meclizine (ANTIVERT) 25 MG tablet Take 25 mg by mouth 3 (three) times daily as needed for dizziness.   Yes [provider]  Melatonin 10 MG CAPS Take 10 mg by mouth at bedtime as needed (sleep).   Yes [provider]  methylPREDNISolone (MEDROL DOSEPAK) 4 MG TBPK tablet Take as prescribed 09/15/21  Yes Antwann Preziosi T, PA-C  naproxen sodium (ALEVE) 220 MG tablet Take 220 mg by mouth daily as needed (pain).   Yes [provider]  olmesartan-hydrochlorothiazide (BENICAR HCT) 40-25 MG tablet Take 1 tablet by mouth daily. 02/20/17  Yes [provider]  Omega-3 Fatty Acids (FISH OIL) 1000 MG CAPS Take 1,000 mg by mouth daily.    Yes [provider]  omeprazole (PRILOSEC) 20 MG capsule Take 1 capsule (20 mg total) by mouth 2 (two) times daily before a meal. 1 po 30 mins prior to [place]. 03/09/21 03/09/22 Yes Carver, Elon Alas, DO  rosuvastatin (CRESTOR) 10 MG tablet Take 10 mg by mouth daily.   Yes [provider]  Turmeric 500 MG TABS Take 500 mg by mouth daily.  Yes [provider]      Allergies    Azithromycin, Lisinopril, and Sulfonamide derivatives    Review of Systems   Review of Systems  Constitutional:  Positive for appetite change. Negative for chills and fever.  HENT:  Positive for congestion.   Respiratory:  Positive for cough and shortness of breath.   Cardiovascular:  Negative for chest pain.  Gastrointestinal:  Positive for diarrhea. Negative for abdominal pain, constipation, nausea and vomiting.  All other systems reviewed and are negative.  Physical Exam Updated Vital Signs BP 140/74    Pulse 100    Temp 98.3 F (36.8 C) (Oral)    Resp 10    Ht 5' 3"  (1.6 m)    Wt 131.5 kg    LMP  07/11/2007 (Approximate)    SpO2 98%    BMI 51.37 kg/m  Physical Exam Vitals and nursing note reviewed.  Constitutional:      Appearance: Normal appearance.  HENT:     Head: Normocephalic and atraumatic.  Eyes:     Conjunctiva/sclera: Conjunctivae normal.  Cardiovascular:     Rate and Rhythm: Normal rate and regular rhythm.  Pulmonary:     Effort: Pulmonary effort is normal. No respiratory distress.     Comments: Expiratory wheezing in all lung fields Abdominal:     General: There is no distension.     Palpations: Abdomen is soft.     Tenderness: There is no abdominal tenderness.  Skin:    General: Skin is warm and dry.  Neurological:     General: No focal deficit present.     Mental Status: She is alert.    ED Results / Procedures / Treatments   Labs (all labs ordered are listed, but only abnormal results are displayed) Labs Reviewed  CBC WITH DIFFERENTIAL/PLATELET - Abnormal; Notable for the following components:      Result Value   MCV 100.3 (*)    Platelets 112 (*)    All other components within normal limits  COMPREHENSIVE METABOLIC PANEL - Abnormal; Notable for the following components:   Sodium 134 (*)    Potassium 3.4 (*)    Calcium 7.3 (*)    Albumin 2.1 (*)    AST 48 (*)    Total Bilirubin 1.6 (*)    Anion gap 4 (*)    All other components within normal limits    EKG None  Radiology DG Chest 2 View  Result Date: 09/15/2021 CLINICAL DATA:  Shortness of breath, cough EXAM: CHEST - 2 VIEW COMPARISON:  Previous studies including the examination of 11/08/2019 FINDINGS: Transverse diameter of heart is increased. Central pulmonary vessels are more prominent. There is subtle increase in interstitial markings in the parahilar regions and lower lung fields. There is no focal pulmonary consolidation. Costophrenic angles are clear. There is no pneumothorax. IMPRESSION: Cardiomegaly. Central pulmonary vessels are more prominent suggesting CHF. There is no focal  pulmonary consolidation. Electronically Signed   By: Elmer Picker M.D.   On: 09/15/2021 12:09    Procedures Procedures    Medications Ordered in ED Medications  sodium chloride 0.9 % bolus 1,000 mL (0 mLs Intravenous Stopped 09/15/21 1641)  albuterol (VENTOLIN HFA) 108 (90 Base) MCG/ACT inhaler 2 puff (2 puffs Inhalation Given 09/15/21 1330)  albuterol (PROVENTIL) (2.5 MG/3ML) 0.083% nebulizer solution 2.5 mg (2.5 mg Nebulization Given 09/15/21 1526)  albuterol (PROVENTIL) (2.5 MG/3ML) 0.083% nebulizer solution 10 mg (10 mg Nebulization Given 09/15/21 1644)    ED Course/ Medical Decision  Making/ A&P                           Medical Decision Making Amount and/or Complexity of Data Reviewed Radiology: ordered.  Risk Prescription drug management.   This patient presents to the ED for concern of cough, this involves an extensive number of treatment options, and is a complaint that carries with it a high risk of complications and morbidity. The emergent differential diagnosis prior to evaluation includes, but is not limited to,  Upper respiratory infection, lower respiratory infection, allergies, asthma, irritants, foreign body, medications such as ACE inhibitors, reflux, asthma, CHF, lung cancer, interstitial lung disease, psychiatric causes, postnasal drip and postinfectious bronchospasm.   This is not an exhaustive differential.   Past Medical History / Co-morbidities / Social History: GERD, hypertension, hypothyroidism, sleep apnea  Physical Exam: Physical exam performed. The pertinent findings include: Patient is afebrile, tachycardic to 112, with good oxygen saturation.  She is in no acute distress.  She has expiratory wheezing in all lung fields.  Labs: I ordered and interpreted labs.  The pertinent results include: No leukocytosis, normal hemoglobin.  Mild hyponatremia 134, mild hypokalemia 3.4.  Imaging Studies: I ordered imaging studies including chest x-ray. I  independently visualized and interpreted imaging which showed prominence of central pulmonary vessels, no focal consolidation. I agree with the radiologist interpretation.   Medications: I ordered medication including IV fluids and albuterol inhaler for dehydration and wheezing. Reevaluation of the patient after these medicines showed that the patient  slightly improved . I have reviewed the patients home medicines and have made adjustments as needed.  Disposition: After consideration of the diagnostic results and the patients response to treatment, I feel that does not require admission or inpatient treatment for symptoms.  Patient's lung sounds were slightly improved after multiple breathing treatments.  She has remained afebrile, hemodynamically stable, with good oxygen saturation.  Her chest x-ray did show some concern for CHF, however I have very low suspicion she is in acute heart failure at this moment.  We discussed the results of her imaging, and the importance of following up with her primary doctor about this.  I think that her symptoms are overall likely related to a viral illness.  Patient was able to tolerate food while in the department.  I ordered labs due to her persistent diarrhea, and these appear normal.  We will treat symptomatically with an inhaler, course of steroids, and cough suppressant at home.  We discussed reasons to return to the emergency department, and patient is agreeable to the plan.  Final Clinical Impression(s) / ED Diagnoses Final diagnoses:  Viral URI with cough  Bronchitis    Rx / DC Orders ED Discharge Orders          Ordered    methylPREDNISolone (MEDROL DOSEPAK) 4 MG TBPK tablet        09/15/21 1654    benzonatate (TESSALON) 100 MG capsule  Every 8 hours        09/15/21 1707           Portions of this report may have been transcribed using voice recognition software. Every effort was made to ensure accuracy; however, inadvertent computerized  transcription errors may be present.    Estill Cotta 09/15/21 Jerolyn Shin, MD 09/15/21 [phone]

## 2021-09-15 NOTE — ED Triage Notes (Signed)
Cough x 1 week with bloody sputum for the past 2 days, referred by PCP ?

## 2021-09-19 DIAGNOSIS — Z87442 Personal history of urinary calculi: Secondary | ICD-10-CM

## 2021-09-19 DIAGNOSIS — Z20822 Contact with and (suspected) exposure to covid-19: Secondary | ICD-10-CM | POA: Diagnosis present

## 2021-09-19 DIAGNOSIS — E782 Mixed hyperlipidemia: Secondary | ICD-10-CM | POA: Diagnosis present

## 2021-09-19 DIAGNOSIS — T380X5A Adverse effect of glucocorticoids and synthetic analogues, initial encounter: Secondary | ICD-10-CM | POA: Diagnosis present

## 2021-09-19 DIAGNOSIS — E1165 Type 2 diabetes mellitus with hyperglycemia: Secondary | ICD-10-CM | POA: Diagnosis present

## 2021-09-19 DIAGNOSIS — Z7982 Long term (current) use of aspirin: Secondary | ICD-10-CM

## 2021-09-19 DIAGNOSIS — Z818 Family history of other mental and behavioral disorders: Secondary | ICD-10-CM

## 2021-09-19 DIAGNOSIS — M069 Rheumatoid arthritis, unspecified: Secondary | ICD-10-CM | POA: Diagnosis present

## 2021-09-19 DIAGNOSIS — K3189 Other diseases of stomach and duodenum: Secondary | ICD-10-CM | POA: Diagnosis present

## 2021-09-19 DIAGNOSIS — E559 Vitamin D deficiency, unspecified: Secondary | ICD-10-CM | POA: Diagnosis present

## 2021-09-19 DIAGNOSIS — R0489 Hemorrhage from other sites in respiratory passages: Principal | ICD-10-CM | POA: Diagnosis present

## 2021-09-19 DIAGNOSIS — Z86711 Personal history of pulmonary embolism: Secondary | ICD-10-CM

## 2021-09-19 DIAGNOSIS — Z881 Allergy status to other antibiotic agents status: Secondary | ICD-10-CM

## 2021-09-19 DIAGNOSIS — J189 Pneumonia, unspecified organism: Secondary | ICD-10-CM | POA: Diagnosis present

## 2021-09-19 DIAGNOSIS — K2970 Gastritis, unspecified, without bleeding: Secondary | ICD-10-CM | POA: Diagnosis present

## 2021-09-19 DIAGNOSIS — J9601 Acute respiratory failure with hypoxia: Secondary | ICD-10-CM | POA: Diagnosis present

## 2021-09-19 DIAGNOSIS — Z833 Family history of diabetes mellitus: Secondary | ICD-10-CM

## 2021-09-19 DIAGNOSIS — K219 Gastro-esophageal reflux disease without esophagitis: Secondary | ICD-10-CM | POA: Diagnosis present

## 2021-09-19 DIAGNOSIS — Z8249 Family history of ischemic heart disease and other diseases of the circulatory system: Secondary | ICD-10-CM

## 2021-09-19 DIAGNOSIS — M797 Fibromyalgia: Secondary | ICD-10-CM | POA: Diagnosis present

## 2021-09-19 DIAGNOSIS — R161 Splenomegaly, not elsewhere classified: Secondary | ICD-10-CM | POA: Diagnosis present

## 2021-09-19 DIAGNOSIS — K7460 Unspecified cirrhosis of liver: Secondary | ICD-10-CM | POA: Diagnosis present

## 2021-09-19 DIAGNOSIS — Z6841 Body Mass Index (BMI) 40.0 and over, adult: Secondary | ICD-10-CM

## 2021-09-19 DIAGNOSIS — R042 Hemoptysis: Secondary | ICD-10-CM | POA: Diagnosis not present

## 2021-09-19 DIAGNOSIS — E039 Hypothyroidism, unspecified: Secondary | ICD-10-CM | POA: Diagnosis present

## 2021-09-19 DIAGNOSIS — Z83438 Family history of other disorder of lipoprotein metabolism and other lipidemia: Secondary | ICD-10-CM

## 2021-09-19 DIAGNOSIS — Z882 Allergy status to sulfonamides status: Secondary | ICD-10-CM

## 2021-09-19 DIAGNOSIS — L4050 Arthropathic psoriasis, unspecified: Secondary | ICD-10-CM | POA: Diagnosis present

## 2021-09-19 DIAGNOSIS — K760 Fatty (change of) liver, not elsewhere classified: Secondary | ICD-10-CM | POA: Diagnosis present

## 2021-09-19 DIAGNOSIS — I10 Essential (primary) hypertension: Secondary | ICD-10-CM | POA: Diagnosis present

## 2021-09-19 DIAGNOSIS — F32A Depression, unspecified: Secondary | ICD-10-CM | POA: Diagnosis present

## 2021-09-19 DIAGNOSIS — G4730 Sleep apnea, unspecified: Secondary | ICD-10-CM | POA: Diagnosis present

## 2021-09-19 DIAGNOSIS — D689 Coagulation defect, unspecified: Secondary | ICD-10-CM | POA: Diagnosis present

## 2021-09-19 DIAGNOSIS — K766 Portal hypertension: Secondary | ICD-10-CM | POA: Diagnosis present

## 2021-09-19 DIAGNOSIS — F419 Anxiety disorder, unspecified: Secondary | ICD-10-CM | POA: Diagnosis present

## 2021-09-19 DIAGNOSIS — G8929 Other chronic pain: Secondary | ICD-10-CM | POA: Diagnosis present

## 2021-09-19 DIAGNOSIS — K589 Irritable bowel syndrome without diarrhea: Secondary | ICD-10-CM | POA: Diagnosis present

## 2021-09-19 DIAGNOSIS — Z888 Allergy status to other drugs, medicaments and biological substances status: Secondary | ICD-10-CM

## 2021-09-19 DIAGNOSIS — K2950 Unspecified chronic gastritis without bleeding: Secondary | ICD-10-CM | POA: Diagnosis present

## 2021-09-19 DIAGNOSIS — Z79899 Other long term (current) drug therapy: Secondary | ICD-10-CM

## 2021-09-19 DIAGNOSIS — Z87891 Personal history of nicotine dependence: Secondary | ICD-10-CM

## 2021-09-19 DIAGNOSIS — Z79890 Hormone replacement therapy (postmenopausal): Secondary | ICD-10-CM

## 2021-09-20 ENCOUNTER — Inpatient Hospital Stay (HOSPITAL_COMMUNITY): Payer: Medicare Other

## 2021-09-20 ENCOUNTER — Inpatient Hospital Stay (HOSPITAL_COMMUNITY)
Admission: EM | Admit: 2021-09-20 | Discharge: 2021-09-22 | DRG: 204 | Disposition: A | Discharge status: Home Health | Payer: Medicare Other | Attending: Family Medicine | Admitting: Family Medicine

## 2021-09-20 ENCOUNTER — Emergency Department (HOSPITAL_COMMUNITY): Payer: Medicare Other

## 2021-09-20 ENCOUNTER — Encounter (HOSPITAL_COMMUNITY): Payer: Self-pay | Admitting: Emergency Medicine

## 2021-09-20 ENCOUNTER — Other Ambulatory Visit: Payer: Self-pay

## 2021-09-20 DIAGNOSIS — Z6841 Body Mass Index (BMI) 40.0 and over, adult: Secondary | ICD-10-CM | POA: Diagnosis not present

## 2021-09-20 DIAGNOSIS — R0489 Hemorrhage from other sites in respiratory passages: Secondary | ICD-10-CM | POA: Diagnosis present

## 2021-09-20 DIAGNOSIS — F32A Depression, unspecified: Secondary | ICD-10-CM | POA: Diagnosis present

## 2021-09-20 DIAGNOSIS — R042 Hemoptysis: Secondary | ICD-10-CM | POA: Diagnosis present

## 2021-09-20 DIAGNOSIS — J189 Pneumonia, unspecified organism: Secondary | ICD-10-CM | POA: Diagnosis present

## 2021-09-20 DIAGNOSIS — K7460 Unspecified cirrhosis of liver: Secondary | ICD-10-CM | POA: Diagnosis present

## 2021-09-20 DIAGNOSIS — I10 Essential (primary) hypertension: Secondary | ICD-10-CM | POA: Diagnosis present

## 2021-09-20 DIAGNOSIS — E0590 Thyrotoxicosis, unspecified without thyrotoxic crisis or storm: Secondary | ICD-10-CM | POA: Diagnosis present

## 2021-09-20 DIAGNOSIS — J9601 Acute respiratory failure with hypoxia: Secondary | ICD-10-CM | POA: Diagnosis present

## 2021-09-20 DIAGNOSIS — K3189 Other diseases of stomach and duodenum: Secondary | ICD-10-CM | POA: Diagnosis present

## 2021-09-20 DIAGNOSIS — E039 Hypothyroidism, unspecified: Secondary | ICD-10-CM | POA: Diagnosis present

## 2021-09-20 DIAGNOSIS — L4050 Arthropathic psoriasis, unspecified: Secondary | ICD-10-CM | POA: Diagnosis present

## 2021-09-20 DIAGNOSIS — D689 Coagulation defect, unspecified: Secondary | ICD-10-CM | POA: Diagnosis present

## 2021-09-20 DIAGNOSIS — Z818 Family history of other mental and behavioral disorders: Secondary | ICD-10-CM | POA: Diagnosis not present

## 2021-09-20 DIAGNOSIS — K766 Portal hypertension: Secondary | ICD-10-CM | POA: Diagnosis present

## 2021-09-20 DIAGNOSIS — K219 Gastro-esophageal reflux disease without esophagitis: Secondary | ICD-10-CM | POA: Diagnosis present

## 2021-09-20 DIAGNOSIS — Z8249 Family history of ischemic heart disease and other diseases of the circulatory system: Secondary | ICD-10-CM | POA: Diagnosis not present

## 2021-09-20 DIAGNOSIS — M797 Fibromyalgia: Secondary | ICD-10-CM | POA: Diagnosis present

## 2021-09-20 DIAGNOSIS — E1165 Type 2 diabetes mellitus with hyperglycemia: Secondary | ICD-10-CM | POA: Diagnosis present

## 2021-09-20 DIAGNOSIS — K2950 Unspecified chronic gastritis without bleeding: Secondary | ICD-10-CM | POA: Diagnosis present

## 2021-09-20 DIAGNOSIS — R739 Hyperglycemia, unspecified: Secondary | ICD-10-CM

## 2021-09-20 DIAGNOSIS — Z20822 Contact with and (suspected) exposure to covid-19: Secondary | ICD-10-CM | POA: Diagnosis present

## 2021-09-20 DIAGNOSIS — E1169 Type 2 diabetes mellitus with other specified complication: Secondary | ICD-10-CM | POA: Diagnosis present

## 2021-09-20 DIAGNOSIS — K760 Fatty (change of) liver, not elsewhere classified: Secondary | ICD-10-CM | POA: Diagnosis present

## 2021-09-20 DIAGNOSIS — E782 Mixed hyperlipidemia: Secondary | ICD-10-CM

## 2021-09-20 DIAGNOSIS — M069 Rheumatoid arthritis, unspecified: Secondary | ICD-10-CM | POA: Diagnosis present

## 2021-09-20 DIAGNOSIS — T380X5A Adverse effect of glucocorticoids and synthetic analogues, initial encounter: Secondary | ICD-10-CM | POA: Diagnosis present

## 2021-09-20 LAB — CBC WITH DIFFERENTIAL/PLATELET
Abs Immature Granulocytes: 0.21 10*3/uL — ABNORMAL HIGH (ref 0.00–0.07)
Basophils Absolute: 0.1 10*3/uL (ref 0.0–0.1)
Basophils Relative: 1 %
Eosinophils Absolute: 0 10*3/uL (ref 0.0–0.5)
Eosinophils Relative: 0 %
HCT: 40.1 % (ref 36.0–46.0)
Hemoglobin: 13.1 g/dL (ref 12.0–15.0)
Immature Granulocytes: 2 %
Lymphocytes Relative: 22 %
Lymphs Abs: 2.4 10*3/uL (ref 0.7–4.0)
MCH: 32.1 pg (ref 26.0–34.0)
MCHC: 32.7 g/dL (ref 30.0–36.0)
MCV: 98.3 fL (ref 80.0–100.0)
Monocytes Absolute: 1.6 10*3/uL — ABNORMAL HIGH (ref 0.1–1.0)
Monocytes Relative: 15 %
Neutro Abs: 6.4 10*3/uL (ref 1.7–7.7)
Neutrophils Relative %: 60 %
Platelets: 151 10*3/uL (ref 150–400)
RBC: 4.08 MIL/uL (ref 3.87–5.11)
RDW: 14.6 % (ref 11.5–15.5)
WBC: 10.7 10*3/uL — ABNORMAL HIGH (ref 4.0–10.5)
nRBC: 0.2 % (ref 0.0–0.2)

## 2021-09-20 LAB — EXPECTORATED SPUTUM ASSESSMENT W GRAM STAIN, RFLX TO RESP C

## 2021-09-20 LAB — TROPONIN I (HIGH SENSITIVITY)
Troponin I (High Sensitivity): 8 ng/L (ref <18)
Troponin I (High Sensitivity): 9 ng/L (ref <18)

## 2021-09-20 LAB — BASIC METABOLIC PANEL
Anion gap: 5 (ref 5–15)
BUN: 10 mg/dL (ref 8–23)
CO2: 29 mmol/L (ref 22–32)
Calcium: 8.1 mg/dL — ABNORMAL LOW (ref 8.9–10.3)
Chloride: 102 mmol/L (ref 98–111)
Creatinine, Ser: 0.63 mg/dL (ref 0.44–1.00)
GFR, Estimated: >60 mL/min (ref >60)
Glucose, Bld: 156 mg/dL — ABNORMAL HIGH (ref 70–99)
Potassium: 3.6 mmol/L (ref 3.5–5.1)
Sodium: 136 mmol/L (ref 135–145)

## 2021-09-20 LAB — RESP PANEL BY RT-PCR (FLU A&B, COVID) ARPGX2
Influenza A by PCR: NEGATIVE
Influenza B by PCR: NEGATIVE
SARS Coronavirus 2 by RT PCR: NEGATIVE

## 2021-09-20 LAB — HIV ANTIBODY (ROUTINE TESTING W REFLEX): HIV Screen 4th Generation wRfx: NONREACTIVE

## 2021-09-20 LAB — BRAIN NATRIURETIC PEPTIDE: B Natriuretic Peptide: 46 pg/mL (ref 0.0–100.0)

## 2021-09-20 LAB — STREP PNEUMONIAE URINARY ANTIGEN: Strep Pneumo Urinary Antigen: NEGATIVE

## 2021-09-20 LAB — SEDIMENTATION RATE: Sed Rate: 69 mm/hr — ABNORMAL HIGH (ref 0–22)

## 2021-09-20 LAB — PROTIME-INR
INR: 1.3 — ABNORMAL HIGH (ref 0.8–1.2)
Prothrombin Time: 16.5 seconds — ABNORMAL HIGH (ref 11.4–15.2)

## 2021-09-20 LAB — MAGNESIUM: Magnesium: 1.9 mg/dL (ref 1.7–2.4)

## 2021-09-20 LAB — PROCALCITONIN: Procalcitonin: <0.1 ng/mL

## 2021-09-20 LAB — PHOSPHORUS: Phosphorus: 2.2 mg/dL — ABNORMAL LOW (ref 2.5–4.6)

## 2021-09-20 MED ORDER — PANTOPRAZOLE SODIUM 40 MG PO TBEC
40.0000 mg | DELAYED_RELEASE_TABLET | Freq: Two times a day (BID) | ORAL | Status: DC
  Administered 2021-09-20 – 2021-09-22 (×4): 40 mg via ORAL
  Filled 2021-09-20 (×4): qty 1

## 2021-09-20 MED ORDER — PANTOPRAZOLE SODIUM 40 MG PO TBEC
40.0000 mg | DELAYED_RELEASE_TABLET | Freq: Every day | ORAL | Status: DC
Start: 2021-09-20 — End: 2021-09-20
  Administered 2021-09-20: 40 mg via ORAL
  Filled 2021-09-20: qty 1

## 2021-09-20 MED ORDER — ACETAMINOPHEN 325 MG PO TABS: 650.0000 mg | ORAL_TABLET | Freq: Four times a day (QID) | ORAL | Status: DC | PRN

## 2021-09-20 MED ORDER — IRBESARTAN 150 MG PO TABS
300.0000 mg | ORAL_TABLET | Freq: Every day | ORAL | Status: DC
  Administered 2021-09-20 – 2021-09-22 (×3): 300 mg via ORAL
  Filled 2021-09-20 (×3): qty 2

## 2021-09-20 MED ORDER — HYDRALAZINE HCL 20 MG/ML IJ SOLN
10.0000 mg | Freq: Four times a day (QID) | INTRAMUSCULAR | Status: DC | PRN
Start: 2021-09-20 — End: 2021-09-22

## 2021-09-20 MED ORDER — SODIUM CHLORIDE 0.9 % IV SOLN
2.0000 g | INTRAVENOUS | Status: DC
  Administered 2021-09-20 – 2021-09-22 (×3): 2 g via INTRAVENOUS
  Filled 2021-09-20 (×3): qty 20

## 2021-09-20 MED ORDER — IPRATROPIUM-ALBUTEROL 0.5-2.5 (3) MG/3ML IN SOLN
3.0000 mL | Freq: Once | RESPIRATORY_TRACT | Status: AC
  Administered 2021-09-20: 3 mL via RESPIRATORY_TRACT
  Filled 2021-09-20: qty 3

## 2021-09-20 MED ORDER — OLMESARTAN MEDOXOMIL-HCTZ 40-25 MG PO TABS: 1.0000 | ORAL_TABLET | Freq: Every day | ORAL | Status: DC

## 2021-09-20 MED ORDER — DOXYCYCLINE HYCLATE 100 MG PO TABS
100.0000 mg | ORAL_TABLET | Freq: Two times a day (BID) | ORAL | Status: DC
  Administered 2021-09-20 – 2021-09-22 (×5): 100 mg via ORAL
  Filled 2021-09-20 (×5): qty 1

## 2021-09-20 MED ORDER — ACETAMINOPHEN 650 MG RE SUPP: 650.0000 mg | Freq: Four times a day (QID) | RECTAL | Status: DC | PRN

## 2021-09-20 MED ORDER — IOHEXOL 350 MG/ML SOLN
100.0000 mL | Freq: Once | INTRAVENOUS | Status: AC | PRN
  Administered 2021-09-20: 100 mL via INTRAVENOUS

## 2021-09-20 MED ORDER — DM-GUAIFENESIN ER 30-600 MG PO TB12
1.0000 | ORAL_TABLET | Freq: Two times a day (BID) | ORAL | Status: DC
  Administered 2021-09-20 – 2021-09-22 (×5): 1 via ORAL
  Filled 2021-09-20 (×5): qty 1

## 2021-09-20 MED ORDER — LEVOTHYROXINE SODIUM 100 MCG PO TABS
100.0000 ug | ORAL_TABLET | Freq: Every day | ORAL | Status: DC
  Administered 2021-09-21 – 2021-09-22 (×2): 100 ug via ORAL
  Filled 2021-09-20 (×2): qty 1

## 2021-09-20 MED ORDER — ROSUVASTATIN CALCIUM 10 MG PO TABS
10.0000 mg | ORAL_TABLET | Freq: Every day | ORAL | Status: DC
  Administered 2021-09-20 – 2021-09-22 (×3): 10 mg via ORAL
  Filled 2021-09-20 (×3): qty 1

## 2021-09-20 MED ORDER — METHYLPREDNISOLONE SODIUM SUCC 40 MG IJ SOLR
40.0000 mg | Freq: Two times a day (BID) | INTRAMUSCULAR | Status: DC
  Administered 2021-09-20 – 2021-09-22 (×4): 40 mg via INTRAVENOUS
  Filled 2021-09-20 (×5): qty 1

## 2021-09-20 MED ORDER — METHYLPREDNISOLONE SODIUM SUCC 125 MG IJ SOLR
125.0000 mg | Freq: Once | INTRAMUSCULAR | Status: AC
  Administered 2021-09-20: 125 mg via INTRAVENOUS
  Filled 2021-09-20: qty 2

## 2021-09-20 MED ORDER — HYDROCHLOROTHIAZIDE 25 MG PO TABS
25.0000 mg | ORAL_TABLET | Freq: Every day | ORAL | Status: DC
  Administered 2021-09-20: 25 mg via ORAL
  Filled 2021-09-20: qty 1

## 2021-09-20 NOTE — Assessment & Plan Note (Signed)
Continue Synthroid °

## 2021-09-20 NOTE — Assessment & Plan Note (Signed)
Continue Crestor 

## 2021-09-20 NOTE — Assessment & Plan Note (Addendum)
It is unknown at this time the cause of patient's blood in sputum/blood clot.  It may be due to her persistent cough. ?Considering her hypoxia on ambulation, consulted with pulmonology.  She is doing better now and no recurrence.  She is clinically much improved and discharging home today.  ?

## 2021-09-20 NOTE — Consult Note (Signed)
? ?NAME:  Yolanda [name], MRN:  [id number], DOB:  1956/03/23, LOS: 0 ?ADMISSION DATE:  09/20/2021, CONSULTATION DATE:  09/20/21 ?REFERRING MD:  Epokpae, CHIEF COMPLAINT:  hemoptysis  ? ?History of Present Illness:  ?13 yowf  with medical history significant of hepatic cirrhosis, psoriatic arthritis, rheumatoid arthritis, morbid obesity, GERD who presents to the emergency department due to cough with bloodstained sputum/clots onset of first hemoptysis  one week PTA p cc cough x 2 weeks and she went to her PCP who started her on prednisone and a cough medicine (did not remember the name), >>> said prednisone helped, but symptoms returned after completing the prednisone pack.  She started to notice blood in sputum about a week PTA, so she was referred to the ED for "lung scan".  At that time, she was provided with breathing treatment and IV hydration and she was discharged home with Tessalon 100 mg capsule and Medrol dosepak.   one day PTA  patient noted some blood and clots after having a cough fit, so she returned to the ED for further evaluation and management.  She endorsed prior history of PE during which she was on anticoagulation, however, she is currently not on any anticoagulation.  She denies fever, chills, headache, blurry vision, chest pain, nausea, vomiting, abdominal pain. ? ?ED course: ?In the emergency department, she was hemodynamically stable, however, oxygen down to 88% on ambulation. Work-up in the ED showd normal CBC except for mild leukocytosis, normal BMP except for hyperglycemia, BNP 46, troponin x2 was negative.  Influenza A, B, SARS coronavirus 2  negative. ?CT angiography chest with contrast showed no evidence of significant pulmonary embolus.  Patchy airspace disease demonstrated in both lungs, consider multifocal pneumonia versus alveolar hemorrhage. ?She was treated with IV Solu-Medrol 125 mg x 1, breathing treatment was provided.  Hospitalist was asked to admit patient for further  evaluation and management. ?  ?PCCM service asked to consult am 3/14 pt continuing with low grade hemoptysis > epistaxis  ?  ? ?Significant Hospital Events: ?Including procedures, antibiotic start and stop dates in addition to other pertinent events   ?CTa  3/14  No evidence of significant pulmonary embolus.Patchy airspace disease demonstrated in both lungs. Consider multifocal pneumonia versus alveolar hemorrhage. ?Hepatic cirrhosis with splenic enlargement and upper abdominal ?varices. ? ? ? ?Scheduled Meds: ? dextromethorphan-guaiFENesin  1 tablet Oral BID  ? doxycycline  100 mg Oral Q12H  ? hydrochlorothiazide  25 mg Oral Daily  ? irbesartan  300 mg Oral Daily  ? levothyroxine  100 mcg Oral Q0600  ? methylPREDNISolone (SOLU-MEDROL) injection  40 mg Intravenous Q12H  ? pantoprazole  40 mg Oral Daily  ? rosuvastatin  10 mg Oral Daily  ? ?Continuous Infusions: ? cefTRIAXone (ROCEPHIN)  IV 2 g (09/20/21 0534)  ? ?PRN Meds:.acetaminophen **OR** acetaminophen  ? ? ?Interim History / Subjective:  ?Comfortable lying flat in bed RA ? ?Objective   ?Blood pressure (!) 153/94, pulse 89, temperature 97.9 ?F (36.6 ?C), temperature source Oral, resp. rate 19, height _0  (1.6 m), weight 135 kg, last menstrual period 07/11/2007, SpO2 95 %. ?   ?   ? ?Intake/Output Summary (Last 24 hours) at 09/20/2021 0859 ?Last data filed at 09/20/2021 0834 ?Gross per 24 hour  ?Intake --  ?Output 300 ml  ?Net -300 ml  ? ?Filed Weights  ? 09/20/21 0016 09/20/21 0440  ?Weight: 131.5 kg 135 kg  ? ? ?Examination: ?Tmax  98.2  ?General appearance:    obese  wf lying flat in bed  ?At Rest 02 sats  94% on RA  ?No jvd ?Oropharynx clear,  mucosa nl ?Neck supple ?Lungs with a few scattered exp > insp rhonchi bilaterally ?RRR no s3 or or sign murmur ?Abd obese with limited  excursion  ?Extr warm with no edema or clubbing noted ?Neuro  Sensorium intact ,  no apparent motor deficits  ? ?  ? ?Assessment & Plan:  ?1) CAP with acute mild hypoxemic resp  failure ?- 3/14  PCT < 0.10  ?- 3/14  Strep pneumo neg ?- 3/14 Legionella Ag  >>> ?>>> agree with empiric rx  ? ?2) epistaxis/ hemoptysis in setting of cirrhosis/ transient thrombocyctopenia  ?Lab Results  ?Component Value Date  ? PLT 151 09/20/2021  ? PLT 112 (L) 09/15/2021  ? PLT 195 12/01/2019  ? PLT 162 11/08/2019  ? PLT 198 06/18/2018  ? PLT 195 10/31/2017  ?  ?Lab Results  ?Component Value Date  ? INR 1.1 07/08/2020  ? INR 1.1 12/01/2019  ? INR 1.1 10/24/2019  ?>>> check INR, no heparin ?>>> check ANA, Anti GBM and ANCA titers plus u/a esr  ? ?3) Obesity with reduced ERV on last pft and no indication of  copd  ?PFT's  02/23/16  nl flows p am symbicort  ERV 20%  at wt 262   ?- could still have Asthma though not clinically evident on exam today   ?  ? ?Labs   ?CBC: ?Recent Labs  ?Lab 09/15/21 ?1354 09/20/21 ?0101  ?WBC 6.9 10.7*  ?NEUTROABS 4.0 6.4  ?HGB 13.3 13.1  ?HCT 39.7 40.1  ?MCV 100.3* 98.3  ?PLT 112* 151  ? ? ?Basic Metabolic Panel: ?Recent Labs  ?Lab 09/15/21 ?1354 09/20/21 ?0101 09/20/21 ?0305  ?NA 134* 136  --   ?K 3.4* 3.6  --   ?CL 102 102  --   ?CO2 28 29  --   ?GLUCOSE 94 156*  --   ?BUN 10 10  --   ?CREATININE 0.58 0.63  --   ?CALCIUM 7.3* 8.1*  --   ?MG  --   --  1.9  ?PHOS  --   --  2.2*  ? ?GFR: ?Estimated Creatinine Clearance: 92 mL/min (by C-G formula based on SCr of 0.63 mg/dL). ?Recent Labs  ?Lab 09/15/21 ?1354 09/20/21 ?0101 09/20/21 ?0305  ?PROCALCITON  --   --  <0.10  ?WBC 6.9 10.7*  --   ? ? ?Liver Function Tests: ?Recent Labs  ?Lab 09/15/21 ?1354  ?AST 48*  ?ALT 32  ?ALKPHOS 83  ?BILITOT 1.6*  ?PROT 7.7  ?ALBUMIN 2.1*  ? ?No results for input(s): LIPASE, AMYLASE in the last 168 hours. ?No results for input(s): AMMONIA in the last 168 hours. ? ?ABG ?   ?Component Value Date/Time  ? PHART 7.444 (H) 07/27/2008 1322  ? PCO2ART 41.4 07/27/2008 1322  ? PO2ART 76.4 (L) 07/27/2008 1322  ? HCO3 27.9 (H) 07/27/2008 1322  ? TCO2 28 04/02/2010 0509  ? O2SAT 95.9 07/27/2008 1322  ?  ? ?Coagulation  Profile: ?No results for input(s): INR, PROTIME in the last 168 hours. ? ?Cardiac Enzymes: ?No results for input(s): CKTOTAL, CKMB, CKMBINDEX, TROPONINI in the last 168 hours. ? ?HbA1C: ?No results found for: HGBA1C ? ?CBG: ?No results for input(s): GLUCAP in the last 168 hours. ? ?  ? ?Past Medical History:  ?She,  has a past medical history of Abnormal uterine bleeding, Anxiety, Arthritis, Calculus of kidney (07/09/2008), Chronic abdominal pain,  Chronic pain in left foot, Clotting disorder ([place]), Depression, Elevated liver function tests (2018), Endometriosis, Family history of adverse reaction to anesthesia, Fatty liver, Fibromyalgia, Gastritis, GERD (12/21/2009), History of kidney stones, Hypertension, Hypothyroidism, IBS (irritable bowel syndrome), Internal hemorrhoids, Kidney stone, Obesity, morbid ([place]) (09/11/2012), PE (07/09/2008), PE (pulmonary embolism), PONV (postoperative nausea and vomiting), Sleep apnea, Thyroid disease, and Vitamin D deficiency.  ? ?Surgical History:  ? ?Past Surgical History:  ?Procedure Laterality Date  ? ABDOMINAL SURGERY    ? laparoscopy  ? BIOPSY N/A 03/24/2014  ? Procedure: GASTRIC BIOPSIES;  Surgeon: Danie Binder, MD;  Location: [facility];  Service: Endoscopy;  Laterality: N/A;  ? BIOPSY  10/28/2019  ? Procedure: BIOPSY;  Surgeon: Danie Binder, MD;  Location: [facility];  Service: Endoscopy;;  gastric nodule  ? BREAST LUMPECTOMY Right   ? CESAREAN SECTION    ? X2  ? COLONOSCOPY  2006  ? internal hemorrhoids  ? COLONOSCOPY WITH PROPOFOL N/A 03/24/2014  ? Dr. [name]: 2 tubular adenomas removed, hemorrhoids  ? COLONOSCOPY WITH PROPOFOL N/A 10/28/2019  ? Fields: External and internal hemorrhoids, 8 polyps ranging from 2 to 5 mm in size removed from the colon.  Multiple tubular adenomas.  Next colonoscopy in 3 years.  ? CYSTOSCOPY W/ RETROGRADES  01/23/2012  ? Procedure: CYSTOSCOPY WITH RETROGRADE PYELOGRAM;  Surgeon: Marissa Nestle, MD;  Location: [facility];  Service: Urology;   Laterality: Left;  ? DILATATION & CURETTAGE/HYSTEROSCOPY WITH MYOSURE N/A 11/18/2014  ? Procedure: DILATATION & CURETTAGE/HYSTEROSCOPY WITH MYOSURE, resection of polyp;  Surgeon: Cheri Fowler, MD;  Location: [facility];

## 2021-09-20 NOTE — Progress Notes (Signed)
Patient seen and evaluated, chart reviewed, please see EMR for updated orders. Please see full H&P dictated by admitting physician Dr Josephine Cables for same date of service.  ? ?Brief Summary": ?66 y.o. female with medical history significant of hepatic cirrhosis, psoriatic arthritis, rheumatoid arthritis, morbid obesity, GERD admitted on 09/20/2021 with concerns about possible hemoptysis and epistasis with concerns for pulmonary alveolar hemorrhage in the setting of underlying liver cirrhosis with a focal pneumonia ? ?A/p ?1) possible pulmonary alveolar hemorrhage--- in the patient with multifocal pneumonia and underlying liver cirrhosis- ?-CTA chest without PE ?-Pulmonology consult appreciated- recommends CT maxillofacial as well as ANA, Anti GBM and ANCA titers plus u/a esr  ?-Continue Rocephin and azithromycin as well along with bronchodilators and mucolytics ?--Continue Solu-Medrol for bronchitic/wheezy component ? ?2) multifocal pneumonia--- management as above #1 ?-Strep pneumonia antigen negative ?-Legionella antigen pending ?-WBC 10.7--- leukocytosis may persist due to steroids ? ?3) acute hypoxic respiratory failure--- due to pneumonia ?-BNP only 46 ?-Troponins are flat ?- patient has been successfully weaned off oxygen ?--Hypoxia has resolved ? ?4)Prophylaxis--- Protonix for GI prophylaxis especially while on high-dose steroids ?-Avoid heparin due to concerns about epistasis and hemoptysis ?-Use teds and SCDs for DVT prophylaxis instead ? ?5)Hypothyroidism--- continue levothyroxine ? ?6)HTN-hold HCTZ, continue Avapro ? may use IV Hydralazine 10 mg  Every 4 hours Prn for systolic blood pressure over 170 mmhg ? ?7)HLD--continue Crestor ? ?-Total care time 43 minutes ?- ?Patient seen and evaluated, chart reviewed, please see EMR for updated orders. Please see full H&P dictated by admitting physician Dr Josephine Cables for same date of service.  ? ?Roxan Hockey, MD ? ? ?

## 2021-09-20 NOTE — Assessment & Plan Note (Signed)
Uncontrolled: Continue Benicar ?

## 2021-09-20 NOTE — ED Triage Notes (Signed)
Pt c/o coughing up blood since last week, but pt states it is worse today.  ?

## 2021-09-20 NOTE — H&P (Signed)
?History and Physical  ? ? ?Patient: Yolanda [name] KDT:[id number] DOB: 1955/08/31 ?DOA: 09/20/2021 ?DOS: the patient was seen and examined on 09/20/2021 ?PCP: Sharilyn Sites, MD  ?Patient coming from: Home ? ?Chief Complaint:  ?Chief Complaint  ?Patient presents with  ? Hemoptysis  ? ?HPI: Yolanda [name] is a 66 y.o. female with medical history significant of hepatic cirrhosis, psoriatic arthritis, rheumatoid arthritis, morbid obesity, GERD who presents to the emergency department due to cough with bloodstained sputum/clots which she noted yesterday.  Patient states that she was having some cough for about 2 weeks and she went to her PCP who started her on prednisone and a cough medicine (did not remember the name), patient states that the prednisone helped, but symptoms returned after completing the prednisone pack.  She started to notice blood in sputum about a week ago, so she was referred to the ED for "lung scan".  At that time, she was provided with breathing treatment and IV hydration and she was discharged home with Tessalon 100 mg capsule and Medrol dosepak.  Yesterday, patient noted some blood and clots after having a cough fit, so she returned to the ED for further evaluation and management.  She endorsed prior history of PE during which she was on anticoagulation, however, she is currently not on any anticoagulation.  She denies fever, chills, headache, blurry vision, chest pain, nausea, vomiting, abdominal pain. ? ?ED course: ?In the emergency department, she was hemodynamically stable, however, oxygen drops to 88% on ambulation.  Work-up in the ED shows normal CBC except for mild leukocytosis, normal BMP except for hyperglycemia, BNP 46, troponin x2 was negative.  Influenza A, B, SARS coronavirus 2 was negative. ?CT angiography chest with contrast showed no evidence of significant pulmonary embolus.  Patchy airspace disease demonstrated in both lungs, consider multifocal pneumonia versus alveolar  hemorrhage. ?She was treated with IV Solu-Medrol 125 mg x 1, breathing treatment was provided.  Hospitalist was asked to admit patient for further evaluation and management. ? ?  ?Review of Systems: As mentioned in the history of present illness. All other systems reviewed and are negative. ? ?Past Medical History:  ?Diagnosis Date  ? Abnormal uterine bleeding   ? Anxiety   ? Arthritis   ? Calculus of kidney 07/09/2008  ? Qualifier: Diagnosis of  By: Kellie Simmering LPN, Almyra Free    ? Chronic abdominal pain   ? Chronic pain in left foot   ? Clotting disorder ([place])   ? Depression   ? Elevated liver function tests 2018  ? Endometriosis   ? Family history of adverse reaction to anesthesia   ? Fatty liver   ? Fibromyalgia   ? Gastritis   ? GERD 12/21/2009  ? Qualifier: Diagnosis of  By: Craige Cotta    ? History of kidney stones   ? Hypertension   ? stopped meds in Aug 2015  ? Hypothyroidism   ? IBS (irritable bowel syndrome)   ? Internal hemorrhoids   ? Kidney stone   ? Obesity, morbid ([place]) 09/11/2012  ? PE 07/09/2008  ? Qualifier: Diagnosis of  By: Kellie Simmering LPN, Almyra Free    ? PE (pulmonary embolism)   ? PONV (postoperative nausea and vomiting)   ? Sleep apnea   ? Thyroid disease   ? Vitamin D deficiency   ? ?Past Surgical History:  ?Procedure Laterality Date  ? ABDOMINAL SURGERY    ? laparoscopy  ? BIOPSY N/A 03/24/2014  ? Procedure: GASTRIC BIOPSIES;  Surgeon: Marga Melnick  Fields, MD;  Location: [facility];  Service: Endoscopy;  Laterality: N/A;  ? BIOPSY  10/28/2019  ? Procedure: BIOPSY;  Surgeon: Danie Binder, MD;  Location: [facility];  Service: Endoscopy;;  gastric nodule  ? BREAST LUMPECTOMY Right   ? CESAREAN SECTION    ? X2  ? COLONOSCOPY  2006  ? internal hemorrhoids  ? COLONOSCOPY WITH PROPOFOL N/A 03/24/2014  ? Dr. [name]: 2 tubular adenomas removed, hemorrhoids  ? COLONOSCOPY WITH PROPOFOL N/A 10/28/2019  ? Fields: External and internal hemorrhoids, 8 polyps ranging from 2 to 5 mm in size removed from the colon.  Multiple tubular  adenomas.  Next colonoscopy in 3 years.  ? CYSTOSCOPY W/ RETROGRADES  01/23/2012  ? Procedure: CYSTOSCOPY WITH RETROGRADE PYELOGRAM;  Surgeon: Marissa Nestle, MD;  Location: [facility];  Service: Urology;  Laterality: Left;  ? DILATATION & CURETTAGE/HYSTEROSCOPY WITH MYOSURE N/A 11/18/2014  ? Procedure: DILATATION & CURETTAGE/HYSTEROSCOPY WITH MYOSURE, resection of polyp;  Surgeon: Cheri Fowler, MD;  Location: [facility];  Service: Gynecology;  Laterality: N/A;  ? DILATION AND CURETTAGE OF UTERUS    ? ESOPHAGOGASTRODUODENOSCOPY   08/24/2006  ? Dr. Veto Kemps erythema of the antrum without erosion or ulcers/Otherwise, normal esophagus without evidence of Barrett's path with chronic gastritis  ? ESOPHAGOGASTRODUODENOSCOPY (EGD) WITH PROPOFOL N/A 03/24/2014  ? Dr. [name]: gastritis  ? ESOPHAGOGASTRODUODENOSCOPY (EGD) WITH PROPOFOL N/A 10/28/2019  ? Fields: Esophagus appeared normal, empiric dilation due to history of dysphagia.  6 mm nodule seen in the gastric cardia.  Mild portal hypertensive gastropathy.  Nodule from the stomach biopsied and showed mild chronic gastritis, no H. pylori.  ? POLYPECTOMY N/A 03/24/2014  ? Procedure: POLYPECTOMY;  Surgeon: Danie Binder, MD;  Location: [facility];  Service: Endoscopy;  Laterality: N/A;  ? POLYPECTOMY  10/28/2019  ? Procedure: POLYPECTOMY;  Surgeon: Danie Binder, MD;  [facility];  Service: Endoscopy;;  hepatic flexure, ascending colon,sigmoid colon, rectal  ? REMOVAL OF STONES  01/23/2012  ? Procedure: REMOVAL OF STONES;  Surgeon: Marissa Nestle, MD;  Location: [facility];  Service: Urology;  Laterality: N/A;  ? SAVORY DILATION N/A 10/28/2019  ? Procedure: SAVORY DILATION;  Surgeon: Danie Binder, MD;  Location: [facility];  Service: Endoscopy;  Laterality: N/A;  ? TUBAL LIGATION    ? ?Social History:  reports that she quit smoking about 22 years ago. Her smoking use included cigarettes. She has never used smokeless tobacco. She reports that she does not  currently use alcohol. She reports that she does not use drugs. ? ?Allergies  ?Allergen Reactions  ? Azithromycin Hives  ? Lisinopril Cough  ? Sulfonamide Derivatives Other (See Comments)  ?  Vaginal Infection   ? ? ?Family History  ?Problem Relation Age of Onset  ? Heart attack Mother   ?     CABG  ? Stroke Mother   ? Hypertension Mother   ? Cancer Mother   ? Diabetes Mother   ? Heart attack Father   ?     CABG  ? Hypertension Father   ? Mesothelioma Father   ? Heart attack Brother   ? Diabetes Brother   ? Depression Brother   ? Hyperlipidemia Brother   ? Diabetes Maternal Grandmother   ? Diabetes Maternal Grandfather   ? Cancer Paternal Grandmother   ? Diabetes Brother   ? Hyperlipidemia Brother   ? Wilson's disease Other   ?     2 nieces, one died  in her 85s  ? Colon cancer Neg Hx   ? ? ?Prior to Admission medications   ?Medication Sig Start Date End Date Taking? Authorizing Provider  ?acetaminophen (TYLENOL) 500 MG tablet Take 500-1,000 mg by mouth every 6 (six) hours as needed for moderate pain or headache.    [provider]  ?albuterol (VENTOLIN HFA) 108 (90 Base) MCG/ACT inhaler Inhale 1-2 puffs into the lungs every 6 (six) hours as needed for wheezing or shortness of breath. 11/08/19   Long, Wonda Olds, MD  ?aspirin EC 81 MG tablet Take 81 mg by mouth daily.    [provider]  ?benzonatate (TESSALON) 100 MG capsule Take 1 capsule (100 mg total) by mouth every 8 (eight) hours. 09/15/21   Roemhildt, Lorin T, PA-C  ?bisacodyl (BISACODYL) 5 MG EC tablet Take 10 mg by mouth daily as needed.    [provider]  ?Cholecalciferol (VITAMIN D3) 2000 units TABS Take 2,000 Units by mouth daily.     [provider]  ?CRANBERRY-VITAMIN C PO Take 1 tablet by mouth daily.     [provider]  ?cyclobenzaprine (FLEXERIL) 10 MG tablet Take 10 mg by mouth daily.    [provider]  ?DULoxetine (CYMBALTA) 60 MG capsule Take 60 mg by mouth daily. 07/15/21   [provider]   ?inFLIXimab (REMICADE IV) Inject into the vein. As directed    [provider]  ?Levothyroxine Sodium 100 MCG CAPS Take 100 mcg by mouth daily before breakfast.     [provider]  ?mecl

## 2021-09-20 NOTE — ED Notes (Signed)
Pts pulse while ambulating went up to 112p ambulating to the end of the hall and to the restroom. Pt O2 was at 88.  ?

## 2021-09-20 NOTE — Assessment & Plan Note (Addendum)
BMI 52.74; patient requires diet and lifestyle modification ?Patient will follow-up with outpatient PCP for weight loss program ?Ambulatory referral to outpatient diabetes and nutrition center ?

## 2021-09-20 NOTE — Assessment & Plan Note (Addendum)
Steroid induced hyperglycemia ? A1c still pending, patient likely has undiagnosed type 2 diabetes mellitus.  I had a long discussion with her today and we decided to go ahead and start treatment.  We will start metformin XR with instructions to titrate up to 2 g twice daily with meals over the next several weeks.  She has follow-up with her primary care provider for further management.  Blood glucose meter testing supplies ordered as well.  Ambulatory referral made to the outpatient diabetes and nutrition center. ?

## 2021-09-20 NOTE — TOC Progression Note (Signed)
?  Transition of Care (TOC) Screening Note ? ? ?Patient Details  ?Name: Yolanda [name] ?Date of Birth: 25-Sep-1955 ? ? ?Transition of Care (TOC) CM/SW Contact:    ?Boneta Lucks, RN ?Phone Number: ?09/20/2021, 11:26 AM ? ? ? ?Transition of Care Department [facility]) has reviewed patient and no TOC needs have been identified at this time. We will continue to monitor patient advancement through interdisciplinary progression rounds. If new patient transition needs arise, please place a TOC consult. ? ? ? ? ?On oxygen - TOC watching for need of home oxygen. ?  ?Barriers to Discharge: Continued Medical Work up ? ? ?

## 2021-09-20 NOTE — ED Provider Notes (Signed)
[facility] EMERGENCY DEPARTMENT Provider Note   CSN: [id number] Arrival date & time: 09/19/21  2354     History  Chief Complaint  Patient presents with   Hemoptysis    Yolanda [name] is a 66 y.o. female.  Patient presents to the emergency department for evaluation of difficulty breathing and cough.  Symptoms have been ongoing for more than a week.  Patient was seen in the ED several days ago and treated for bronchitis.  Patient now coughing up blood and clots.  She has had a history of PE in the past, not currently on anticoagulation.      Home Medications Prior to Admission medications   Medication Sig Start Date End Date Taking? Authorizing Provider  acetaminophen (TYLENOL) 500 MG tablet Take 500-1,000 mg by mouth every 6 (six) hours as needed for moderate pain or headache.    [provider]  albuterol (VENTOLIN HFA) 108 (90 Base) MCG/ACT inhaler Inhale 1-2 puffs into the lungs every 6 (six) hours as needed for wheezing or shortness of breath. 11/08/19   Long, Arlyss Repress, MD  aspirin EC 81 MG tablet Take 81 mg by mouth daily.    [provider]  benzonatate (TESSALON) 100 MG capsule Take 1 capsule (100 mg total) by mouth every 8 (eight) hours. 09/15/21   Roemhildt, Lorin T, PA-C  bisacodyl (BISACODYL) 5 MG EC tablet Take 10 mg by mouth daily as needed.    [provider]  Cholecalciferol (VITAMIN D3) 2000 units TABS Take 2,000 Units by mouth daily.     [provider]  CRANBERRY-VITAMIN C PO Take 1 tablet by mouth daily.     [provider]  cyclobenzaprine (FLEXERIL) 10 MG tablet Take 10 mg by mouth daily.    [provider]  DULoxetine (CYMBALTA) 60 MG capsule Take 60 mg by mouth daily. 07/15/21   [provider]  inFLIXimab (REMICADE IV) Inject into the vein. As directed    [provider]  Levothyroxine Sodium 100 MCG CAPS Take 100 mcg by mouth daily before breakfast.     [provider]   meclizine (ANTIVERT) 25 MG tablet Take 25 mg by mouth 3 (three) times daily as needed for dizziness.    [provider]  Melatonin 10 MG CAPS Take 10 mg by mouth at bedtime as needed (sleep).    [provider]  methylPREDNISolone (MEDROL DOSEPAK) 4 MG TBPK tablet Take as prescribed 09/15/21   Roemhildt, Lorin T, PA-C  naproxen sodium (ALEVE) 220 MG tablet Take 220 mg by mouth daily as needed (pain).    [provider]  olmesartan-hydrochlorothiazide (BENICAR HCT) 40-25 MG tablet Take 1 tablet by mouth daily. 02/20/17   [provider]  Omega-3 Fatty Acids (FISH OIL) 1000 MG CAPS Take 1,000 mg by mouth daily.     [provider]  omeprazole (PRILOSEC) 20 MG capsule Take 1 capsule (20 mg total) by mouth 2 (two) times daily before a meal. 1 po 30 mins prior to FIRST MEAL. 03/09/21 03/09/22  Lanelle Bal, DO  rosuvastatin (CRESTOR) 10 MG tablet Take 10 mg by mouth daily.    [provider]  Turmeric 500 MG TABS Take 500 mg by mouth daily.     [provider]      Allergies    Azithromycin, Lisinopril, and Sulfonamide derivatives    Review of Systems   Review of Systems  Respiratory:  Positive for cough and shortness of breath.  Physical Exam Updated Vital Signs BP (!) 169/96   Pulse 98   Temp 98 F (36.7 C) (Oral)   Resp 19   Ht 5\' 3"  (1.6 m)   Wt 131.5 kg   LMP 07/11/2007 (Approximate)   SpO2 91%   BMI 51.35 kg/m  Physical Exam Vitals and nursing note reviewed.  Constitutional:      General: She is not in acute distress.    Appearance: She is well-developed.  HENT:     Head: Normocephalic and atraumatic.     Mouth/Throat:     Mouth: Mucous membranes are moist.  Eyes:     General: Vision grossly intact. Gaze aligned appropriately.     Extraocular Movements: Extraocular movements intact.     Conjunctiva/sclera: Conjunctivae normal.  Cardiovascular:     Rate and Rhythm: Regular rhythm. Tachycardia present.      Pulses: Normal pulses.     Heart sounds: Normal heart sounds, S1 normal and S2 normal. No murmur heard.   No friction rub. No gallop.  Pulmonary:     Effort: Accessory muscle usage present. No respiratory distress.     Breath sounds: Decreased air movement present. Decreased breath sounds and wheezing present.  Abdominal:     General: Bowel sounds are normal.     Palpations: Abdomen is soft.     Tenderness: There is no abdominal tenderness. There is no guarding or rebound.     Hernia: No hernia is present.  Musculoskeletal:        General: No swelling.     Cervical back: Full passive range of motion without pain, normal range of motion and neck supple. No spinous process tenderness or muscular tenderness. Normal range of motion.     Right lower leg: No edema.     Left lower leg: No edema.  Skin:    General: Skin is warm and dry.     Capillary Refill: Capillary refill takes less than 2 seconds.     Findings: No ecchymosis, erythema, rash or wound.  Neurological:     General: No focal deficit present.     Mental Status: She is alert and oriented to person, place, and time.     GCS: GCS eye subscore is 4. GCS verbal subscore is 5. GCS motor subscore is 6.     Cranial Nerves: Cranial nerves 2-12 are intact.     Sensory: Sensation is intact.     Motor: Motor function is intact.     Coordination: Coordination is intact.  Psychiatric:        Attention and Perception: Attention normal.        Mood and Affect: Mood normal.        Speech: Speech normal.        Behavior: Behavior normal.    ED Results / Procedures / Treatments   Labs (all labs ordered are listed, but only abnormal results are displayed) Labs Reviewed  CBC WITH DIFFERENTIAL/PLATELET - Abnormal; Notable for the following components:      Result Value   WBC 10.7 (*)    Monocytes Absolute 1.6 (*)    Abs Immature Granulocytes 0.21 (*)    All other components within normal limits  BASIC METABOLIC PANEL - Abnormal; Notable  for the following components:   Glucose, Bld 156 (*)    Calcium 8.1 (*)    All other components within normal limits  RESP PANEL BY RT-PCR (FLU A&B, COVID) ARPGX2  BRAIN NATRIURETIC PEPTIDE  TROPONIN I (HIGH SENSITIVITY)  TROPONIN I (HIGH SENSITIVITY)    EKG None  Radiology CT Angio Chest Pulmonary Embolism (PE) W or WO Contrast  Result Date: 09/20/2021 CLINICAL DATA:  Pulmonary embolus suspected with high probability. Coughing up blood since last week. EXAM: CT ANGIOGRAPHY CHEST WITH CONTRAST TECHNIQUE: Multidetector CT imaging of the chest was performed using the standard protocol during bolus administration of intravenous contrast. Multiplanar CT image reconstructions and MIPs were obtained to evaluate the vascular anatomy. RADIATION DOSE REDUCTION: This exam was performed according to the departmental dose-optimization program which includes automated exposure control, adjustment of the mA and/or kV according to patient size and/or use of iterative reconstruction technique. CONTRAST:  OMNIPAQUE IOHEXOL 350 MG/ML SOLN COMPARISON:  11/08/2019 FINDINGS: Cardiovascular: Moderately good opacification of the central and segmental pulmonary arteries. No focal filling defects are identified suggesting no significant pulmonary embolus to the segmental level. Subsegmental vessels may not be well opacified, limiting evaluation. Normal caliber thoracic aorta. No aortic dissection. Great vessel origins are patent. Cardiac enlargement. No pericardial effusions. Mediastinum/Nodes: Esophagus is decompressed. No significant lymphadenopathy. Thyroid gland is unremarkable. Lungs/Pleura: Motion artifact limits examination but there is the appearance of multiple focal areas of patchy airspace disease scattered throughout both lungs. This likely represents multifocal pneumonia. In the setting of hemoptysis, alveolar hemorrhage could also have this appearance. Airways are patent. No pleural effusions. Upper  Abdomen: Hepatic cirrhosis with splenic enlargement. Upper abdominal varices are present. Musculoskeletal: No chest wall abnormality. No acute or significant osseous findings. Review of the MIP images confirms the above findings. IMPRESSION: 1. No evidence of significant pulmonary embolus. 2. Patchy airspace disease demonstrated in both lungs. Consider multifocal pneumonia versus alveolar hemorrhage. 3. Hepatic cirrhosis with splenic enlargement and upper abdominal varices. Electronically Signed   By: Burman Nieves M.D.   On: 09/20/2021 02:12    Procedures Procedures    Medications Ordered in ED Medications  methylPREDNISolone sodium succinate (SOLU-MEDROL) 125 mg/2 mL injection 125 mg (has no administration in time range)  ipratropium-albuterol (DUONEB) 0.5-2.5 (3) MG/3ML nebulizer solution 3 mL (has no administration in time range)  iohexol (OMNIPAQUE) 350 MG/ML injection 100 mL (100 mLs Intravenous Contrast Given 09/20/21 0152)    ED Course/ Medical Decision Making/ A&P                           Medical Decision Making Amount and/or Complexity of Data Reviewed Labs: ordered. Radiology: ordered.  Risk Prescription drug management.   Patient presents to the emergency department for evaluation of shortness of breath and hemoptysis.  Differential diagnosis includes acute bronchitis, pneumonia, PE.  Patient does have acute bronchospasm at arrival.  This is consistent with her previously documented bronchospasm at earlier visit.  Lab work was unrevealing.  Patient underwent CT angiography to further evaluate for possible PE.  No PE is seen, but patient likely has diffuse alveolar hemorrhage.  Patient tachycardic and borderline hypoxic.  Heart rate increases and she desats to 88% with minimal ambulation.  Will require hospitalization for further management.  CRITICAL CARE Performed by: Gilda Crease   Total critical care time: 32 minutes  Critical care time was exclusive of  separately billable procedures and treating other patients.  Critical care was necessary to treat or prevent imminent or life-threatening deterioration.  Critical care was time spent personally by me on the following activities: development of treatment plan with patient and/or surrogate as well as nursing, discussions with consultants, evaluation of patient's response to treatment,  examination of patient, obtaining history from patient or surrogate, ordering and performing treatments and interventions, ordering and review of laboratory studies, ordering and review of radiographic studies, pulse oximetry and re-evaluation of patient's condition.         Final Clinical Impression(s) / ED Diagnoses Final diagnoses:  Acute respiratory failure with hypoxia (HCC)  Intra-alveolar hemorrhage    Rx / DC Orders ED Discharge Orders     None         Rylynn Kobs, Canary Brim, MD 09/20/21 [phone]

## 2021-09-20 NOTE — Assessment & Plan Note (Signed)
Continue Protonix °

## 2021-09-20 NOTE — Assessment & Plan Note (Addendum)
CT of chest was suggestive of multifocal pneumonia ?Patient will be empirically started on IV ceftriaxone and doxycycline and clinically much improved.  ?Continue Tylenol as needed ?Continue Mucinex, incentive spirometry, flutter valve  ? ? ?

## 2021-09-21 DIAGNOSIS — R739 Hyperglycemia, unspecified: Secondary | ICD-10-CM | POA: Diagnosis not present

## 2021-09-21 DIAGNOSIS — I10 Essential (primary) hypertension: Secondary | ICD-10-CM | POA: Diagnosis not present

## 2021-09-21 DIAGNOSIS — R0489 Hemorrhage from other sites in respiratory passages: Secondary | ICD-10-CM | POA: Diagnosis not present

## 2021-09-21 DIAGNOSIS — K219 Gastro-esophageal reflux disease without esophagitis: Secondary | ICD-10-CM | POA: Diagnosis not present

## 2021-09-21 LAB — COMPREHENSIVE METABOLIC PANEL
ALT: 42 U/L (ref 0–44)
AST: 51 U/L — ABNORMAL HIGH (ref 15–41)
Albumin: 1.9 g/dL — ABNORMAL LOW (ref 3.5–5.0)
Alkaline Phosphatase: 94 U/L (ref 38–126)
Anion gap: 6 (ref 5–15)
BUN: 15 mg/dL (ref 8–23)
CO2: 28 mmol/L (ref 22–32)
Calcium: 8 mg/dL — ABNORMAL LOW (ref 8.9–10.3)
Chloride: 100 mmol/L (ref 98–111)
Creatinine, Ser: 0.65 mg/dL (ref 0.44–1.00)
GFR, Estimated: >60 mL/min (ref >60)
Glucose, Bld: 302 mg/dL — ABNORMAL HIGH (ref 70–99)
Potassium: 3.3 mmol/L — ABNORMAL LOW (ref 3.5–5.1)
Sodium: 134 mmol/L — ABNORMAL LOW (ref 135–145)
Total Bilirubin: 0.6 mg/dL (ref 0.3–1.2)
Total Protein: 7.4 g/dL (ref 6.5–8.1)

## 2021-09-21 LAB — LEGIONELLA PNEUMOPHILA SEROGP 1 UR AG: L. pneumophila Serogp 1 Ur Ag: NEGATIVE

## 2021-09-21 LAB — CBC
HCT: 40.6 % (ref 36.0–46.0)
Hemoglobin: 13 g/dL (ref 12.0–15.0)
MCH: 31.5 pg (ref 26.0–34.0)
MCHC: 32 g/dL (ref 30.0–36.0)
MCV: 98.3 fL (ref 80.0–100.0)
Platelets: 156 10*3/uL (ref 150–400)
RBC: 4.13 MIL/uL (ref 3.87–5.11)
RDW: 14.6 % (ref 11.5–15.5)
WBC: 15.3 10*3/uL — ABNORMAL HIGH (ref 4.0–10.5)
nRBC: 0 % (ref 0.0–0.2)

## 2021-09-21 LAB — GLOMERULAR BASEMENT MEMBRANE ANTIBODIES: GBM Ab: <0.2 units (ref 0.0–0.9)

## 2021-09-21 LAB — GLUCOSE, CAPILLARY
Glucose-Capillary: 255 mg/dL — ABNORMAL HIGH (ref 70–99)
Glucose-Capillary: 163 mg/dL — ABNORMAL HIGH (ref 70–99)

## 2021-09-21 LAB — ANA W/REFLEX IF POSITIVE: Anti Nuclear Antibody (ANA): NEGATIVE

## 2021-09-21 MED ORDER — INSULIN ASPART 100 UNIT/ML IJ SOLN
0.0000 [IU] | Freq: Three times a day (TID) | INTRAMUSCULAR | Status: DC
  Administered 2021-09-21: 11 [IU] via SUBCUTANEOUS
  Administered 2021-09-22: 4 [IU] via SUBCUTANEOUS

## 2021-09-21 MED ORDER — INSULIN ASPART 100 UNIT/ML IJ SOLN: 0.0000 [IU] | Freq: Three times a day (TID) | INTRAMUSCULAR | Status: DC

## 2021-09-21 MED ORDER — POTASSIUM CHLORIDE CRYS ER 20 MEQ PO TBCR
60.0000 meq | EXTENDED_RELEASE_TABLET | Freq: Once | ORAL | Status: AC
  Administered 2021-09-21: 60 meq via ORAL
  Filled 2021-09-21: qty 3

## 2021-09-21 MED ORDER — INSULIN ASPART 100 UNIT/ML IJ SOLN
6.0000 [IU] | Freq: Three times a day (TID) | INTRAMUSCULAR | Status: DC
  Administered 2021-09-22: 6 [IU] via SUBCUTANEOUS

## 2021-09-21 MED ORDER — INSULIN ASPART 100 UNIT/ML IJ SOLN: 0.0000 [IU] | Freq: Every day | INTRAMUSCULAR | Status: DC

## 2021-09-21 MED ORDER — INSULIN GLARGINE-YFGN 100 UNIT/ML ~~LOC~~ SOLN
18.0000 [IU] | Freq: Every day | SUBCUTANEOUS | Status: DC
  Administered 2021-09-21: 18 [IU] via SUBCUTANEOUS
  Filled 2021-09-21 (×2): qty 0.18

## 2021-09-21 NOTE — Progress Notes (Addendum)
Inpatient Diabetes Program Recommendations ? ?AACE/ADA: New Consensus Statement on Inpatient Glycemic Control  ? ?Target Ranges:  Prepandial:   less than 140 mg/dL ?     Peak postprandial:   less than 180 mg/dL (1-2 hours) ?     Critically ill patients:  140 - 180 mg/dL  ? ? Latest Reference Range & Units 09/20/21 01:01 09/21/21 04:47  ?Glucose 70 - 99 mg/dL 156 (H) 302 (H)  ? ? ?Review of Glycemic Control ? ?Diabetes history: No ?Outpatient Diabetes medications: NA ?Current orders for Inpatient glycemic control: None; Solumedrol 40 mg Q12H ? ?Inpatient Diabetes Program Recommendations:   ? ?Insulin: If steroids are continued, please consider ordering CBGs AC&HS with Novolog 0-15 units AC&HS.  ? ?Thanks, ?Barnie Alderman, RN, MSN, CDE ?Diabetes Coordinator ?Inpatient Diabetes Program ?[phone] (Team Pager from 8am to 5pm) ? ? ? ? ?

## 2021-09-21 NOTE — Hospital Course (Addendum)
Yolanda [name] is a 66 y.o. female with medical history significant of hepatic cirrhosis, psoriatic arthritis, rheumatoid arthritis, morbid obesity, GERD who presents to the emergency department due to cough with bloodstained sputum/clots which she noted yesterday.  Patient states that she was having some cough for about 2 weeks and she went to her PCP who started her on prednisone and a cough medicine (did not remember the name), patient states that the prednisone helped, but symptoms returned after completing the prednisone pack.  She started to notice blood in sputum about a week ago, so she was referred to the ED for "lung scan".  At that time, she was provided with breathing treatment and IV hydration and she was discharged home with Tessalon 100 mg capsule and Medrol dosepak.  Yesterday, patient noted some blood and clots after having a cough fit, so she returned to the ED for further evaluation and management.  She endorsed prior history of PE during which she was on anticoagulation, however, she is currently not on any anticoagulation.  She denies fever, chills, headache, blurry vision, chest pain, nausea, vomiting, abdominal pain. ? ?ED course: ?In the emergency department, she was hemodynamically stable, however, oxygen drops to 88% on ambulation.  Work-up in the ED shows normal CBC except for mild leukocytosis, normal BMP except for hyperglycemia, BNP 46, troponin x2 was negative.  Influenza A, B, SARS coronavirus 2 was negative. ?CT angiography chest with contrast showed no evidence of significant pulmonary embolus.  Patchy airspace disease demonstrated in both lungs, consider multifocal pneumonia versus alveolar hemorrhage. ?She was treated with IV Solu-Medrol 125 mg x 1, breathing treatment was provided.  Hospitalist was asked to admit patient for further evaluation and management. ? ?09/22/2021:  Pt feeling much better, breathing better, blood sugars much better controlled.  DC  home today.  HHPT  recommended.  Ambulatory referral to outpatient diabetes and nutrition made.  Follow up with PCP.  ?

## 2021-09-21 NOTE — Progress Notes (Signed)
?PROGRESS NOTE ? ? ?Yolanda [name]  LSL:[id number] DOB: Nov 25, 1955 DOA: 09/20/2021 ?PCP: Sharilyn Sites, MD  ? ?Chief Complaint  ?Patient presents with  ? Hemoptysis  ? ?Level of care: Med-Surg ? ?Brief Admission History:  ?Yolanda [name] is a 66 y.o. female with medical history significant of hepatic cirrhosis, psoriatic arthritis, rheumatoid arthritis, morbid obesity, GERD who presents to the emergency department due to cough with bloodstained sputum/clots which she noted yesterday.  Patient states that she was having some cough for about 2 weeks and she went to her PCP who started her on prednisone and a cough medicine (did not remember the name), patient states that the prednisone helped, but symptoms returned after completing the prednisone pack.  She started to notice blood in sputum about a week ago, so she was referred to the ED for "lung scan".  At that time, she was provided with breathing treatment and IV hydration and she was discharged home with Tessalon 100 mg capsule and Medrol dosepak.  Yesterday, patient noted some blood and clots after having a cough fit, so she returned to the ED for further evaluation and management.  She endorsed prior history of PE during which she was on anticoagulation, however, she is currently not on any anticoagulation.  She denies fever, chills, headache, blurry vision, chest pain, nausea, vomiting, abdominal pain. ? ?ED course: ?In the emergency department, she was hemodynamically stable, however, oxygen drops to 88% on ambulation.  Work-up in the ED shows normal CBC except for mild leukocytosis, normal BMP except for hyperglycemia, BNP 46, troponin x2 was negative.  Influenza A, B, SARS coronavirus 2 was negative. ?CT angiography chest with contrast showed no evidence of significant pulmonary embolus.  Patchy airspace disease demonstrated in both lungs, consider multifocal pneumonia versus alveolar hemorrhage. ?She was treated with IV Solu-Medrol 125 mg x 1,  breathing treatment was provided.  Hospitalist was asked to admit patient for further evaluation and management. ?  ?Assessment and Plan: ?* Pulmonary alveolar hemorrhage ?It is unknown at this time the cause of patient's blood in sputum/blood clot.  It may be due to her persistent cough. ?Considering her hypoxia on ambulation, it will be reasonable to consult with pulmonology for clarification regarding patient's hemoptysis. ? ?Hyperglycemia ?Steroid induced hyperglycemia ?Check A1c,  ?Add prandial coverage, semglee QHS, SSI coverage and frequent CBG monitoring.  ? ?Multifocal pneumonia ?CT of chest was suggestive of multifocal pneumonia ?Patient will be empirically started on IV ceftriaxone and doxycycline with plan to de-escalate/discontinue based on blood culture, sputum culture, urine Legionella, strep pneumo and procalcitonin ?Continue Tylenol as needed ?Continue Mucinex, incentive spirometry, flutter valve  ? ? ? ?Hypothyroidism ?Continue Synthroid ? ?Morbid obesity ([place]) ?BMI 52.74; patient requires diet and lifestyle modification ?Patient will follow-up with outpatient PCP for weight loss program ? ?GERD ?Continue Protonix ? ?Essential hypertension ?Uncontrolled: Continue Benicar ? ?Mixed hyperlipidemia ?Continue Crestor ? ?DVT prophylaxis: SCDs ?Code Status: full  ?Family Communication:  ?Disposition: Status is: Inpatient ?Remains inpatient appropriate because: IV antibiotics  ?  ?Consultants:  ?Pulmonary ?Procedures:  ? ?Antimicrobials:  ?See MAR   ?Subjective: ?Pt reports only coughing up small amount of blood at this time.  ?Objective: ?Vitals:  ? 09/20/21 1213 09/20/21 2041 09/21/21 0657 09/21/21 1238  ?BP: (!) 160/81 (!) 163/81 (!) 170/77 (!) 160/77  ?Pulse: 97 (!) 105 94 97  ?Resp: 18 20 20 18   ?Temp: 98 ?F (36.7 ?C) 98.1 ?F (36.7 ?C) 98 ?F (36.7 ?C) 97.9 ?F (36.6 ?C)  ?TempSrc:  Oral Oral Oral Oral  ?SpO2: 94% 94% 96% 96%  ?Weight:      ?Height:      ? ? ?Intake/Output Summary (Last 24 hours) at  09/21/2021 1809 ?Last data filed at 09/21/2021 1238 ?Gross per 24 hour  ?Intake 1072 ml  ?Output --  ?Net 1072 ml  ? ?Filed Weights  ? 09/20/21 0016 09/20/21 0440  ?Weight: 131.5 kg 135 kg  ? ?Examination: ? ?General exam: Appears calm and comfortable  ?Respiratory system: Clear to auscultation. Respiratory effort normal. ?Cardiovascular system: normal S1 & S2 heard. No JVD, murmurs, rubs, gallops or clicks. No pedal edema. ?Gastrointestinal system: Abdomen is nondistended, soft and nontender. No organomegaly or masses felt. Normal bowel sounds heard. ?Central nervous system: Alert and oriented. No focal neurological deficits. ?Extremities: Symmetric 5 x 5 power. ?Skin: No rashes, lesions or ulcers. ?Psychiatry: Judgement and insight appear normal. Mood & affect appropriate.  ? ?Data Reviewed: I have personally reviewed following labs and imaging studies ? ?CBC: ?Recent Labs  ?Lab 09/15/21 ?1354 09/20/21 ?0101 09/21/21 ?2482  ?WBC 6.9 10.7* 15.3*  ?NEUTROABS 4.0 6.4  --   ?HGB 13.3 13.1 13.0  ?HCT 39.7 40.1 40.6  ?MCV 100.3* 98.3 98.3  ?PLT 112* 151 156  ? ? ?Basic Metabolic Panel: ?Recent Labs  ?Lab 09/15/21 ?1354 09/20/21 ?0101 09/20/21 ?0305 09/21/21 ?5003  ?NA 134* 136  --  134*  ?K 3.4* 3.6  --  3.3*  ?CL 102 102  --  100  ?CO2 28 29  --  28  ?GLUCOSE 94 156*  --  302*  ?BUN 10 10  --  15  ?CREATININE 0.58 0.63  --  0.65  ?CALCIUM 7.3* 8.1*  --  8.0*  ?MG  --   --  1.9  --   ?PHOS  --   --  2.2*  --   ? ? ?CBG: ?No results for input(s): GLUCAP in the last 168 hours. ? ?Recent Results (from the past 240 hour(s))  ?Resp Panel by RT-PCR (Flu A&B, Covid) Nasopharyngeal Swab     Status: None  ? Collection Time: 09/20/21  3:45 AM  ? Specimen: Nasopharyngeal Swab; Nasopharyngeal(NP) swabs in vial transport medium  ?Result Value Ref Range Status  ? SARS Coronavirus 2 by RT PCR NEGATIVE NEGATIVE Final  ?  Comment: (NOTE) ?SARS-CoV-2 target nucleic acids are NOT DETECTED. ? ?The SARS-CoV-2 RNA is generally detectable in  upper respiratory ?specimens during the acute phase of infection. The lowest ?concentration of SARS-CoV-2 viral copies this assay can detect is ?138 copies/mL. A negative result does not preclude SARS-Cov-2 ?infection and should not be used as the sole basis for treatment or ?other patient management decisions. A negative result may occur with  ?improper specimen collection/handling, submission of specimen other ?than nasopharyngeal swab, presence of viral mutation(s) within the ?areas targeted by this assay, and inadequate number of viral ?copies(<138 copies/mL). A negative result must be combined with ?clinical observations, patient history, and epidemiological ?information. The expected result is Negative. ? ?Fact Sheet for Patients:  ?EntrepreneurPulse.com.au ? ?Fact Sheet for Healthcare Providers:  ?IncredibleEmployment.be ? ?This test is no t yet approved or cleared by the [place] FDA and  ?has been authorized for detection and/or diagnosis of SARS-CoV-2 by ?FDA under an Emergency Use Authorization (EUA). This EUA will remain  ?in effect (meaning this test can be used) for the duration of the ?COVID-19 declaration under Section 564(b)(1) of the Act, 21 ?U.S.C.section 360bbb-3(b)(1), unless the authorization is terminated  ?or  revoked sooner.  ? ? ?  ? Influenza A by PCR NEGATIVE NEGATIVE Final  ? Influenza B by PCR NEGATIVE NEGATIVE Final  ?  Comment: (NOTE) ?The Xpert Xpress SARS-CoV-2/FLU/RSV plus assay is intended as an aid ?in the diagnosis of influenza from Nasopharyngeal swab specimens and ?should not be used as a sole basis for treatment. Nasal washings and ?aspirates are unacceptable for Xpert Xpress SARS-CoV-2/FLU/RSV ?testing. ? ?Fact Sheet for Patients: ?EntrepreneurPulse.com.au ? ?Fact Sheet for Healthcare Providers: ?IncredibleEmployment.be ? ?This test is not yet approved or cleared by the [place] FDA and ?has been  authorized for detection and/or diagnosis of SARS-CoV-2 by ?FDA under an Emergency Use Authorization (EUA). This EUA will remain ?in effect (meaning this test can be used) for the duration of the ?COVID-19 decla

## 2021-09-22 DIAGNOSIS — R0489 Hemorrhage from other sites in respiratory passages: Secondary | ICD-10-CM | POA: Diagnosis not present

## 2021-09-22 DIAGNOSIS — R739 Hyperglycemia, unspecified: Secondary | ICD-10-CM | POA: Diagnosis not present

## 2021-09-22 DIAGNOSIS — I10 Essential (primary) hypertension: Secondary | ICD-10-CM | POA: Diagnosis not present

## 2021-09-22 DIAGNOSIS — K219 Gastro-esophageal reflux disease without esophagitis: Secondary | ICD-10-CM | POA: Diagnosis not present

## 2021-09-22 LAB — ANCA TITERS
Atypical P-ANCA titer: <1:20 {titer}
C-ANCA: <1:20 {titer}
P-ANCA: <1:20 {titer}

## 2021-09-22 LAB — CBC
HCT: 40.8 % (ref 36.0–46.0)
Hemoglobin: 13.6 g/dL (ref 12.0–15.0)
MCH: 33.6 pg (ref 26.0–34.0)
MCHC: 33.3 g/dL (ref 30.0–36.0)
MCV: 100.7 fL — ABNORMAL HIGH (ref 80.0–100.0)
Platelets: 146 10*3/uL — ABNORMAL LOW (ref 150–400)
RBC: 4.05 MIL/uL (ref 3.87–5.11)
RDW: 14.8 % (ref 11.5–15.5)
WBC: 15.3 10*3/uL — ABNORMAL HIGH (ref 4.0–10.5)
nRBC: 0 % (ref 0.0–0.2)

## 2021-09-22 LAB — BASIC METABOLIC PANEL
Anion gap: 5 (ref 5–15)
BUN: 17 mg/dL (ref 8–23)
CO2: 30 mmol/L (ref 22–32)
Calcium: 7.6 mg/dL — ABNORMAL LOW (ref 8.9–10.3)
Chloride: 101 mmol/L (ref 98–111)
Creatinine, Ser: 0.68 mg/dL (ref 0.44–1.00)
GFR, Estimated: >60 mL/min (ref >60)
Glucose, Bld: 272 mg/dL — ABNORMAL HIGH (ref 70–99)
Potassium: 3.8 mmol/L (ref 3.5–5.1)
Sodium: 136 mmol/L (ref 135–145)

## 2021-09-22 LAB — GLUCOSE, CAPILLARY
Glucose-Capillary: 195 mg/dL — ABNORMAL HIGH (ref 70–99)
Glucose-Capillary: 163 mg/dL — ABNORMAL HIGH (ref 70–99)
Glucose-Capillary: 103 mg/dL — ABNORMAL HIGH (ref 70–99)

## 2021-09-22 LAB — MAGNESIUM: Magnesium: 2.1 mg/dL (ref 1.7–2.4)

## 2021-09-22 MED ORDER — DM-GUAIFENESIN ER 30-600 MG PO TB12
1.0000 | ORAL_TABLET | Freq: Two times a day (BID) | ORAL | 0 refills | Status: AC
Start: 2021-09-22 — End: 2021-09-25

## 2021-09-22 MED ORDER — BLOOD GLUCOSE METER KIT: PACK | 0 refills | Status: AC

## 2021-09-22 MED ORDER — PREDNISONE 20 MG PO TABS: ORAL_TABLET | ORAL | 0 refills | Status: DC

## 2021-09-22 MED ORDER — METFORMIN HCL ER 500 MG PO TB24: ORAL_TABLET | ORAL | 1 refills | Status: DC

## 2021-09-22 MED ORDER — DOXYCYCLINE HYCLATE 100 MG PO TABS: 100.0000 mg | ORAL_TABLET | Freq: Two times a day (BID) | ORAL | 0 refills | Status: AC

## 2021-09-22 NOTE — Progress Notes (Signed)
Patient dressed self for discharge and states feeling much better.  Discharge instructions reviewed and follow up appts made.  To call pulmonary MD for appointment and has his business card with number.  New scripts sent to pharmacy and to obtain glucose meter kit to start keeping track of readings.  Understands importance due to being started on metformin as new medication at discharge.  IV removed and calling for ride home.    ?

## 2021-09-22 NOTE — Discharge Instructions (Signed)
IMPORTANT INFORMATION: PAY CLOSE ATTENTION  ? ?PHYSICIAN DISCHARGE INSTRUCTIONS ? ?Follow with Primary care provider  Sharilyn Sites, MD  and other consultants as instructed by your Hospitalist Physician ? ?SEEK MEDICAL CARE OR RETURN TO EMERGENCY ROOM IF SYMPTOMS COME BACK, WORSEN OR NEW PROBLEM DEVELOPS  ? ?Please note: ?You were cared for by a hospitalist during your hospital stay. Every effort will be made to forward records to your primary care provider.  You can request that your primary care provider send for your hospital records if they have not received them.  Once you are discharged, your primary care physician will handle any further medical issues. Please note that NO REFILLS for any discharge medications will be authorized once you are discharged, as it is imperative that you return to your primary care physician (or establish a relationship with a primary care physician if you do not have one) for your post hospital discharge needs so that they can reassess your need for medications and monitor your lab values. ? ?Please get a complete blood count and chemistry panel checked by your Primary MD at your next visit, and again as instructed by your Primary MD. ? ?Get Medicines reviewed and adjusted: ?Please take all your medications with you for your next visit with your Primary MD ? ?Laboratory/radiological data: ?Please request your Primary MD to go over all hospital tests and procedure/radiological results at the follow up, please ask your primary care provider to get all Hospital records sent to his/her office. ? ?In some cases, they will be blood work, cultures and biopsy results pending at the time of your discharge. Please request that your primary care provider follow up on these results. ? ?If you are diabetic, please bring your blood sugar readings with you to your follow up appointment with primary care.   ? ?Please call and make your follow up appointments as soon as possible.   ? ?Also Note  the following: ?If you experience worsening of your admission symptoms, develop shortness of breath, life threatening emergency, suicidal or homicidal thoughts you must seek medical attention immediately by calling 911 or calling your MD immediately  if symptoms less severe. ? ?You must read complete instructions/literature along with all the possible adverse reactions/side effects for all the Medicines you take and that have been prescribed to you. Take any new Medicines after you have completely understood and accpet all the possible adverse reactions/side effects.  ? ?Do not drive when taking Pain medications or sleeping medications (Benzodiazepines) ? ?Do not take more than prescribed Pain, Sleep and Anxiety Medications. It is not advisable to combine anxiety,sleep and pain medications without talking with your primary care practitioner ? ?Special Instructions: If you have smoked or chewed Tobacco  in the last 2 yrs please stop smoking, stop any regular Alcohol  and or any Recreational drug use. ? ?Wear Seat belts while driving.  Do not drive if taking any narcotic, mind altering or controlled substances or recreational drugs or alcohol.  ? ? ? ? ? ?

## 2021-09-22 NOTE — Care Management Important Message (Signed)
Important Message ? ?Patient Details  ?Name: Yolanda [name] ?MRN: [id number] ?Date of Birth: [birth year] ? ? ?Medicare Important Message Given:  N/A - LOS <3 / Initial given by admissions ? ? ? ? ?Tommy Medal ?09/22/2021, 11:31 AM ?

## 2021-09-22 NOTE — Progress Notes (Incomplete)
? ?NAME:  Yolanda [name], MRN:  [id number], DOB:  1955-12-01, LOS: 2 ?ADMISSION DATE:  09/20/2021, CONSULTATION DATE:  09/20/21 ?REFERRING MD:  Epokpae, CHIEF COMPLAINT:  hemoptysis  ? ?History of Present Illness:  ?69 yowf  with medical history significant of hepatic cirrhosis, psoriatic arthritis, rheumatoid arthritis, morbid obesity, GERD who presents to the emergency department due to cough with bloodstained sputum/clots onset of first hemoptysis  one week PTA p cc cough x 2 weeks and she went to her PCP who started her on prednisone and a cough medicine (did not remember the name), >>> said prednisone helped, but symptoms returned after completing the prednisone pack.  She started to notice blood in sputum about a week PTA, so she was referred to the ED for "lung scan".  At that time, she was provided with breathing treatment and IV hydration and she was discharged home with Tessalon 100 mg capsule and Medrol dosepak.   one day PTA  patient noted some blood and clots after having a cough fit, so she returned to the ED for further evaluation and management.  She endorsed prior history of PE during which she was on anticoagulation, however, she is currently not on any anticoagulation.  She denies fever, chills, headache, blurry vision, chest pain, nausea, vomiting, abdominal pain. ? ?ED course: ?In the emergency department, she was hemodynamically stable, however, oxygen down to 88% on ambulation. Work-up in the ED showd normal CBC except for mild leukocytosis, normal BMP except for hyperglycemia, BNP 46, troponin x2 was negative.  Influenza A, B, SARS coronavirus 2  negative. ?CT angiography chest with contrast showed no evidence of significant pulmonary embolus.  Patchy airspace disease demonstrated in both lungs, consider multifocal pneumonia versus alveolar hemorrhage. ?She was treated with IV Solu-Medrol 125 mg x 1, breathing treatment was provided.  Hospitalist was asked to admit patient for further  evaluation and management. ?  ?PCCM service asked to consult am 3/14 pt continuing with low grade hemoptysis > epistaxis  ?  ? ?Significant Hospital Events: ?Including procedures, antibiotic start and stop dates in addition to other pertinent events   ?CTa  3/14  No evidence of significant pulmonary embolus.Patchy airspace disease demonstrated in both lungs. Consider multifocal pneumonia versus alveolar hemorrhage. ?Hepatic cirrhosis with splenic enlargement and upper abdominal varices. ?3/14 urine strep,legionella ag neg  ?- ESR  3/14 67   with neg ana/ neg Anti-gbm ?- Sinus CT 3/14 neg  ?- ANCA 3/14 >>>  ? ? ? ?Scheduled Meds: ? dextromethorphan-guaiFENesin  1 tablet Oral BID  ? doxycycline  100 mg Oral Q12H  ? insulin aspart  0-20 Units Subcutaneous TID WC  ? insulin aspart  0-5 Units Subcutaneous QHS  ? insulin aspart  6 Units Subcutaneous TID WC  ? insulin glargine-yfgn  18 Units Subcutaneous QHS  ? irbesartan  300 mg Oral Daily  ? levothyroxine  100 mcg Oral Q0600  ? methylPREDNISolone (SOLU-MEDROL) injection  40 mg Intravenous Q12H  ? pantoprazole  40 mg Oral BID AC  ? rosuvastatin  10 mg Oral Daily  ? ?Continuous Infusions: ? cefTRIAXone (ROCEPHIN)  IV 2 g (09/22/21 0414)  ? ?PRN Meds:.acetaminophen **OR** acetaminophen, hydrALAZINE  ? ? ?Interim History / Subjective:  ?*** ? ?Objective   ?Blood pressure (!) 166/84, pulse 95, temperature 98.1 ?F (36.7 ?C), temperature source Oral, resp. rate 18, height 5' 3"  (1.6 m), weight 135 kg, last menstrual period 07/11/2007, SpO2 97 %. ?   ?   ? ?Intake/Output Summary (Last  24 hours) at 09/22/2021 0851 ?Last data filed at 09/21/2021 1900 ?Gross per 24 hour  ?Intake 1312 ml  ?Output --  ?Net 1312 ml  ? ?Filed Weights  ? 09/20/21 0016 09/20/21 0440  ?Weight: 131.5 kg 135 kg  ? ? ?Examination: ?Tmax   98.2 ?General appearance:    ***  ?At Rest 02 sats  ***% on ***  ?No jvd ?Oropharynx clear,  mucosa nl ?Neck supple ?Lungs with a few scattered exp > insp rhonchi  bilaterally ?RRR no s3 or or sign murmur ?Abd obese with *** excursion  ?Extr warm with no edema or clubbing noted ?Neuro  Sensorium ***,  no apparent motor deficits  ? ?  ? ?Assessment & Plan:  ?1) CAP with acute mild hypoxemic resp failure ?- 3/14  PCT < 0.10  ?- 3/14  Strep pneumo neg ?- 3/14 Legionella Ag  neg  ?>>> agree with empiric rx  ? ?2) epistaxis/ hemoptysis in setting of cirrhosis/ transient thrombocyctopenia  ?Lab Results  ?Component Value Date  ? PLT 146 (L) 09/22/2021  ? PLT 156 09/21/2021  ? PLT 151 09/20/2021  ? PLT 195 12/01/2019  ? PLT 198 06/18/2018  ? PLT 195 10/31/2017  ?  ?Lab Results  ?Component Value Date  ? INR 1.3 (H) 09/20/2021  ? INR 1.1 07/08/2020  ? INR 1.1 12/01/2019  ? ?Lab Results  ?Component Value Date  ? INR 1.3 (H) 09/20/2021  ? INR 1.1 07/08/2020  ? INR 1.1 12/01/2019  ?- ESR  3/14 69    ?>>> no heparin ?>>> check ANA, Anti GBM and ANCA titers plus u/a   ? ?3) Obesity with reduced ERV on last pft and no indication of  copd  ?-  PFT's  02/23/16  nl flows p am symbicort  ERV 20%  at wt 262   ?- could still have Asthma though not clinically evident on exam today   ?  ? ?Labs   ?CBC: ?Recent Labs  ?Lab 09/15/21 ?1354 09/20/21 ?0101 09/21/21 ?0349 09/22/21 ?0449  ?WBC 6.9 10.7* 15.3* 15.3*  ?NEUTROABS 4.0 6.4  --   --   ?HGB 13.3 13.1 13.0 13.6  ?HCT 39.7 40.1 40.6 40.8  ?MCV 100.3* 98.3 98.3 100.7*  ?PLT 112* 151 156 146*  ? ? ?Basic Metabolic Panel: ?Recent Labs  ?Lab 09/15/21 ?1354 09/20/21 ?0101 09/20/21 ?0305 09/21/21 ?1791 09/22/21 ?0449  ?NA 134* 136  --  134* 136  ?K 3.4* 3.6  --  3.3* 3.8  ?CL 102 102  --  100 101  ?CO2 28 29  --  28 30  ?GLUCOSE 94 156*  --  302* 272*  ?BUN 10 10  --  15 17  ?CREATININE 0.58 0.63  --  0.65 0.68  ?CALCIUM 7.3* 8.1*  --  8.0* 7.6*  ?MG  --   --  1.9  --  2.1  ?PHOS  --   --  2.2*  --   --   ? ?GFR: ?Estimated Creatinine Clearance: 92 mL/min (by C-G formula based on SCr of 0.68 mg/dL). ?Recent Labs  ?Lab 09/15/21 ?1354 09/20/21 ?0101  09/20/21 ?0305 09/21/21 ?5056 09/22/21 ?0449  ?PROCALCITON  --   --  <0.10  --   --   ?WBC 6.9 10.7*  --  15.3* 15.3*  ? ? ?Liver Function Tests: ?Recent Labs  ?Lab 09/15/21 ?1354 09/21/21 ?9794  ?AST 48* 51*  ?ALT 32 42  ?ALKPHOS 83 94  ?BILITOT 1.6* 0.6  ?PROT 7.7 7.4  ?  ALBUMIN 2.1* 1.9*  ? ?No results for input(s): LIPASE, AMYLASE in the last 168 hours. ?No results for input(s): AMMONIA in the last 168 hours. ? ?ABG ?   ?Component Value Date/Time  ? PHART 7.444 (H) 07/27/2008 1322  ? PCO2ART 41.4 07/27/2008 1322  ? PO2ART 76.4 (L) 07/27/2008 1322  ? HCO3 27.9 (H) 07/27/2008 1322  ? TCO2 28 04/02/2010 0509  ? O2SAT 95.9 07/27/2008 1322  ?  ? ?Coagulation Profile: ?Recent Labs  ?Lab 09/20/21 ?1625  ?INR 1.3*  ? ? ?Cardiac Enzymes: ?No results for input(s): CKTOTAL, CKMB, CKMBINDEX, TROPONINI in the last 168 hours. ? ?HbA1C: ?No results found for: HGBA1C ? ?CBG: ?Recent Labs  ?Lab 09/21/21 ?1941 09/21/21 ?2108 09/22/21 ?0235 09/22/21 ?0740  ?GLUCAP 255* 163* 195* 163*  ? ? ?Christinia Gully, MD ?Pulmonary and Critical Care Medicine ?[place] ?Cell [phone]  ? ?After 7:00 pm call Elink  [phone]   ?

## 2021-09-22 NOTE — Discharge Summary (Signed)
Physician Discharge Summary  ?[name] QBH:[id number] DOB: 10-18-1955 DOA: 09/20/2021 ? ?PCP: Sharilyn Sites, MD ? ?Admit date: 09/20/2021 ?Discharge date: 09/22/2021 ? ?Admitted From:  Home  ?Disposition: Home with HH  ? ?Recommendations for Outpatient Follow-up:  ?Follow up with PCP in 1 weeks ?Follow up on A1c testing ?Ambulatory referral made to diabetes and nutrition ?HHPT ordered ? ?Home Health:  PT  ? ?Discharge Condition: STABLE   ?CODE STATUS: FULL ?DIET: Heart Healthy / Carb Modified   ? ?Brief Hospitalization Summary: ?Please see all hospital notes, images, labs for full details of the hospitalization. ?Yolanda [name] is a 66 y.o. female with medical history significant of hepatic cirrhosis, psoriatic arthritis, rheumatoid arthritis, morbid obesity, GERD who presents to the emergency department due to cough with bloodstained sputum/clots which she noted yesterday.  Patient states that she was having some cough for about 2 weeks and she went to her PCP who started her on prednisone and a cough medicine (did not remember the name), patient states that the prednisone helped, but symptoms returned after completing the prednisone pack.  She started to notice blood in sputum about a week ago, so she was referred to the ED for "lung scan".  At that time, she was provided with breathing treatment and IV hydration and she was discharged home with Tessalon 100 mg capsule and Medrol dosepak.  Yesterday, patient noted some blood and clots after having a cough fit, so she returned to the ED for further evaluation and management.  She endorsed prior history of PE during which she was on anticoagulation, however, she is currently not on any anticoagulation.  She denies fever, chills, headache, blurry vision, chest pain, nausea, vomiting, abdominal pain. ? ?ED course: ?In the emergency department, she was hemodynamically stable, however, oxygen drops to 88% on ambulation.  Work-up in the ED shows normal CBC  except for mild leukocytosis, normal BMP except for hyperglycemia, BNP 46, troponin x2 was negative.  Influenza A, B, SARS coronavirus 2 was negative. ?CT angiography chest with contrast showed no evidence of significant pulmonary embolus.  Patchy airspace disease demonstrated in both lungs, consider multifocal pneumonia versus alveolar hemorrhage. ?She was treated with IV Solu-Medrol 125 mg x 1, breathing treatment was provided.  Hospitalist was asked to admit patient for further evaluation and management. ? ?09/22/2021:  Pt feeling much better, breathing better, blood sugars much better controlled.  DC  home today.  HHPT recommended.  Ambulatory referral to outpatient diabetes and nutrition made.  Follow up with PCP.  ? ?Hospital course by problem list ? ?Assessment and Plan: ?* Pulmonary alveolar hemorrhage ?It is unknown at this time the cause of patient's blood in sputum/blood clot.  It may be due to her persistent cough. ?Considering her hypoxia on ambulation, consulted with pulmonology.  She is doing better now and no recurrence.  She is clinically much improved and discharging home today.  ? ?Hyperglycemia ?Steroid induced hyperglycemia ? A1c still pending, patient likely has undiagnosed type 2 diabetes mellitus.  I had a long discussion with her today and we decided to go ahead and start treatment.  We will start metformin XR with instructions to titrate up to 2 g twice daily with meals over the next several weeks.  She has follow-up with her primary care provider for further management.  Blood glucose meter testing supplies ordered as well.  Ambulatory referral made to the outpatient diabetes and nutrition center. ? ?Multifocal pneumonia ?CT of chest was suggestive of multifocal pneumonia ?  Patient will be empirically started on IV ceftriaxone and doxycycline and clinically much improved.  ?Continue Tylenol as needed ?Continue Mucinex, incentive spirometry, flutter valve  ? ? ? ?Hypothyroidism ?Continue  Synthroid ? ?Morbid obesity ([place]) ?BMI 52.74; patient requires diet and lifestyle modification ?Patient will follow-up with outpatient PCP for weight loss program ?Ambulatory referral to outpatient diabetes and nutrition center ? ?GERD ?Continue Protonix ? ?Essential hypertension ?Uncontrolled: Continue Benicar ? ?Mixed hyperlipidemia ?Continue Crestor ? ?Discharge Diagnoses:  ?Principal Problem: ?  Pulmonary alveolar hemorrhage ?Active Problems: ?  Mixed hyperlipidemia ?  Essential hypertension ?  GERD ?  Morbid obesity ([place]) ?  Hypothyroidism ?  Multifocal pneumonia ?  Hyperglycemia ? ? ?Discharge Instructions: ?Discharge Instructions   ? ? Referral to Nutrition and Diabetes Services   Complete by: As directed ?  ? Choose type of Diabetes Self-Management Training (DSMT) training services and number of hours requested: Initial DSMT: 10 hours  ? Check all special needs that apply to patient requiring 1 on 1 DSMT:  Low literacy ?Eating disorder  ?  ? DSMT Content: Comprehensive self-management skills- All of the content areas  ? Choose the type of Medical Nutrition Therapy (MNT) and number of hours: Initial MNT: 3 hours  ? FOR MEDICARE PATIENTS: I hereby certify that I am managing this beneficiary's diabetes condition and that the above prescribed training is a necessary part of management.: Yes  ? ?  ? ?Allergies as of 09/22/2021   ? ?   Reactions  ? Azithromycin Hives  ? Lisinopril Cough  ? Sulfonamide Derivatives Other (See Comments)  ? Vaginal Infection   ? ?  ? ?  ?Medication List  ?  ? ?STOP taking these medications   ? ?benzonatate 100 MG capsule ?Commonly known as: TESSALON ?  ?methylPREDNISolone 4 MG Tbpk tablet ?Commonly known as: MEDROL DOSEPAK ?  ?naproxen sodium 220 MG tablet ?Commonly known as: ALEVE ?  ? ?  ? ?TAKE these medications   ? ?acetaminophen 500 MG tablet ?Commonly known as: TYLENOL ?Take 500-1,000 mg by mouth every 6 (six) hours as needed for moderate pain or headache. ?  ?albuterol 108 (90  Base) MCG/ACT inhaler ?Commonly known as: VENTOLIN HFA ?Inhale 1-2 puffs into the lungs every 6 (six) hours as needed for wheezing or shortness of breath. ?  ?aspirin EC 81 MG tablet ?Take 81 mg by mouth daily. ?  ?bisacodyl 5 MG EC tablet ?Generic drug: bisacodyl ?Take 10 mg by mouth daily as needed. ?  ?blood glucose meter kit and supplies ?Dispense based on patient and insurance preference. Use up to four times daily as directed. (FOR ICD-10 E10.9, E11.9). ?  ?cyclobenzaprine 10 MG tablet ?Commonly known as: FLEXERIL ?Take 10 mg by mouth daily. ?  ?dextromethorphan-guaiFENesin 30-600 MG 12hr tablet ?Commonly known as: [place] DM ?Take 1 tablet by mouth 2 (two) times daily for 3 days. ?  ?doxycycline 100 MG tablet ?Commonly known as: VIBRA-TABS ?Take 1 tablet (100 mg total) by mouth every 12 (twelve) hours for 3 days. ?  ?DULoxetine 60 MG capsule ?Commonly known as: CYMBALTA ?Take 60 mg by mouth daily. ?  ?Fish Oil 1000 MG Caps ?Take 1,000 mg by mouth daily. ?  ?Levothyroxine Sodium 100 MCG Caps ?Take 100 mcg by mouth daily before breakfast. ?  ?meclizine 25 MG tablet ?Commonly known as: ANTIVERT ?Take 25 mg by mouth 3 (three) times daily as needed for dizziness. ?  ?Melatonin 10 MG Caps ?Take 10 mg by mouth at bedtime as  needed (sleep). ?  ?metFORMIN 500 MG 24 hr tablet ?Commonly known as: GLUCOPHAGE-XR ?1 po with supper x 1 week, then 1 po BID WC x 1 week, then 2 po BID WC ?  ?olmesartan-hydrochlorothiazide 40-25 MG tablet ?Commonly known as: BENICAR HCT ?Take 1 tablet by mouth daily. ?  ?omeprazole 20 MG capsule ?Commonly known as: PRILOSEC ?Take 1 capsule (20 mg total) by mouth 2 (two) times daily before a meal. 1 po 30 mins prior to [place]. ?What changed:  ?when to take this ?reasons to take this ?additional instructions ?  ?predniSONE 20 MG tablet ?Commonly known as: DELTASONE ?Take 2 PO QAM x 5 days ?Start taking on: September 23, 2021 ?  ?REMICADE IV ?Inject into the vein. As directed ?  ?rosuvastatin 10  MG tablet ?Commonly known as: CRESTOR ?Take 10 mg by mouth daily. ?  ?traMADol-acetaminophen 37.5-325 MG tablet ?Commonly known as: ULTRACET ?Take 1-2 tablets by mouth every 6 (six) hours as needed for moderate

## 2021-09-22 NOTE — TOC Transition Note (Signed)
Transition of Care (TOC) - CM/SW Discharge Note ? ? ?Patient Details  ?Name: Yolanda [name] ?MRN: [id number] ?Date of Birth: 11-27-1955 ? ?Transition of Care (TOC) CM/SW Contact:  ?Boneta Lucks, RN ?Phone Number: ?09/22/2021, 11:14 AM ? ? ?Clinical Narrative:  Patient discharging home. MD ordering home health, patient is agreeable, no preferences. Vaughan Basta with [facility] accepted the referral for PT/OT.  ? ?Final next level of care: [place] ?Barriers to Discharge: Barriers Resolved ? ? ?Patient Goals and CMS Choice ?Patient states their goals for this hospitalization and ongoing recovery are:: to go home. ?CMS Medicare.gov Compare Post Acute Care list provided to:: Patient ?Choice offered to / list presented to : Patient ? ?Discharge Placement ?  ?           ?Patient and family notified of of transfer: 09/22/21 ? ?Discharge Plan and Services ?  ?    ?HH Arranged: PT, OT ?[place] Agency: [place] [facility] ?Date HH Agency Contacted: 09/22/21 ?Time [place]: 5053 ?Representative spoke with at [place]: Romualdo Bolk ? ? ?Readmission Risk Interventions ?Readmission Risk Prevention Plan 09/22/2021  ?Post Dischage Appt Complete  ?Medication Screening Complete  ?Transportation Screening Complete  ?Some recent data might be hidden  ? ? ? ? ? ?

## 2021-09-23 LAB — CULTURE, RESPIRATORY W GRAM STAIN
Culture: NORMAL
Gram Stain: NONE SEEN

## 2021-09-23 LAB — HEMOGLOBIN A1C
Hgb A1c MFr Bld: 5.7 % — ABNORMAL HIGH (ref 4.8–5.6)
Mean Plasma Glucose: 117 mg/dL

## 2021-10-31 ENCOUNTER — Encounter: Payer: Medicare Other | Attending: Family Medicine | Admitting: Nutrition

## 2021-10-31 ENCOUNTER — Telehealth: Payer: Self-pay | Admitting: Nutrition

## 2021-10-31 ENCOUNTER — Encounter: Payer: Self-pay | Admitting: Nutrition

## 2021-10-31 VITALS — Ht 63.0 in | Wt 297.0 lb

## 2021-10-31 DIAGNOSIS — K7460 Unspecified cirrhosis of liver: Secondary | ICD-10-CM

## 2021-10-31 DIAGNOSIS — I10 Essential (primary) hypertension: Secondary | ICD-10-CM

## 2021-10-31 DIAGNOSIS — E782 Mixed hyperlipidemia: Secondary | ICD-10-CM

## 2021-10-31 DIAGNOSIS — E1165 Type 2 diabetes mellitus with hyperglycemia: Secondary | ICD-10-CM | POA: Diagnosis not present

## 2021-10-31 DIAGNOSIS — Z713 Dietary counseling and surveillance: Secondary | ICD-10-CM | POA: Diagnosis not present

## 2021-10-31 DIAGNOSIS — E8881 Metabolic syndrome: Secondary | ICD-10-CM

## 2021-10-31 NOTE — Patient Instructions (Addendum)
Goals ? ?Eat three meals per day ?Increase plant based foods ?Drink only water ?Choose plant based lifestyle ?Eat meals on time ?Cut out snacks between meals and after supper. ?Lose 1./2-1 lb per week. ?

## 2021-10-31 NOTE — Progress Notes (Signed)
Medical Nutrition Therapy  ?Appointment Start time:  7408  Appointment End time:  1448 ? ?Primary concerns today: DX Dm Type 2 in ER with recent hospitalization. A1C 5.7%, but has multiple blood sugars > 200 randomly and elevated FBS > 150's, Obesity, Fatty Liver   ?Referral diagnosis: E11.69, R73.9, K76, E66.01 ? ?Preferred learning style: No preference  ?Learning readiness: Ready  ? ? ?NUTRITION ASSESSMENT  ?Here with her daughter, William Hamburger. ?Was in the hospital in March 2023 with Pnemonia. BS were 200-300's in the hospital. BS have been > 200's and then in the 80's at time at home. ?30 day avg BS is 157 mg/dl. She is testing twice a day. Is on Metformin 500 mg day. Was suppose to go up to 2 per day, but wasn't sure if she was suppose to or not.  ?Is changing PCP to Dr. Juel Burrow office from Dr.  Hilma Favors. ?Has NASH, sleep apnea but not on a machine. Depression and anxiety. ?Had Pneumonia in most recent hospitalization. ? ?Current diet of eating 1-2 meals per day and grazing isn't meeting her nutritional needs. Current diet is poor in whole foods/plant based and usually high in processed foods. ? ? ?Anthropometrics  ?Wt Readings from Last 3 Encounters:  ?09/20/21 297 lb 11.2 oz (135 kg)  ?09/15/21 290 lb (131.5 kg)  ?03/09/21 287 lb 12.8 oz (130.5 kg)  ? ?Ht Readings from Last 3 Encounters:  ?09/20/21 5' 3"  (1.6 m)  ?09/15/21 5' 3"  (1.6 m)  ?03/09/21 5' 4"  (1.626 m)  ? ?There is no height or weight on file to calculate BMI. ?@BMIFA @ ?Facility age limit for growth percentiles is 20 years. ?Facility age limit for growth percentiles is 20 years. ?  ? ?Clinical ?Medical Hx: See chart ?Medications: Metformin 500 mg once a day, but prescribed BID ?Labs:  ?Lab Results  ?Component Value Date  ? HGBA1C 5.7 (H) 09/21/2021  ? ? ?  Latest Ref Rng & Units 09/22/2021  ?  4:49 AM 09/21/2021  ?  4:47 AM 09/20/2021  ?  1:01 AM  ?CMP  ?Glucose 70 - 99 mg/dL 272   302   156    ?BUN 8 - 23 mg/dL 17   15   10     ?Creatinine 0.44 - 1.00  mg/dL 0.68   0.65   0.63    ?Sodium 135 - 145 mmol/L 136   134   136    ?Potassium 3.5 - 5.1 mmol/L 3.8   3.3   3.6    ?Chloride 98 - 111 mmol/L 101   100   102    ?CO2 22 - 32 mmol/L 30   28   29     ?Calcium 8.9 - 10.3 mg/dL 7.6   8.0   8.1    ?Total Protein 6.5 - 8.1 g/dL  7.4     ?Total Bilirubin 0.3 - 1.2 mg/dL  0.6     ?Alkaline Phos 38 - 126 U/L  94     ?AST 15 - 41 U/L  51     ?ALT 0 - 44 U/L  42     ? ?Lipid Panel  ?   ?Component Value Date/Time  ? CHOL 169 09/13/2012 0517  ? TRIG 164 (H) 09/13/2012 0517  ? HDL 36 (L) 09/13/2012 0517  ? CHOLHDL 4.7 09/13/2012 0517  ? VLDL 33 09/13/2012 0517  ? [place] 100 (H) 09/13/2012 0517  ? ? ?Notable Signs/Symptoms: Fatigue, poor balance, HIgh and low blood sugars ? ?Lifestyle &  Dietary Hx ?Her daughter and niece live with her. Limited mobility. Uses a can. ?Unsteady on feet. Eats 1-2 meals per day. Likes to snack on stuff. Eats late at night. Likes junk food. ? ?Estimated daily fluid intake: 36 oz ?Supplements: see chart ?Sleep: 5-6 ?Stress / self-care: varies ?Current average weekly physical activity: ADL ? ?24-Hr Dietary Recall ?Eats 2 meals per day. Grazes during day and at night. ? ?Estimated Energy Needs ?Calories: 1200 ?Carbohydrate: 135g ?Protein: 90g ?Fat: 33g ? ? ?NUTRITION DIAGNOSIS  ?NB-1.1 Food and nutrition-related knowledge deficit As related to Diabetes Type 2.  As evidenced by 30 DAY AVG bs 157. Random blood sugars > 200 with increased thirsty and frequent urination and dry mouth.. ? ? ?NUTRITION INTERVENTION  ?Nutrition education (E-1) on the following topics:  ?Nutrition and Diabetes education provided on My Plate, CHO counting, meal planning, portion sizes, timing of meals, avoiding snacks between meals unless having a low blood sugar, target ranges for A1C and blood sugars, signs/symptoms and treatment of hyper/hypoglycemia, monitoring blood sugars, taking medications as prescribed, benefits of exercising 30 minutes per day and prevention of  complications of DM.  ?  ?Lifestyle Medicine ?- Whole Food, Plant Predominant Nutrition is highly recommended: Eat Plenty of vegetables, Mushrooms, fruits, Legumes, Whole Grains, Nuts, seeds in lieu of processed meats, processed snacks/pastries red meat, poultry, eggs.  ?  ?-It is better to avoid simple carbohydrates including: Cakes, Sweet Desserts, Ice Cream, Soda (diet and regular), Sweet Tea, Candies, Chips, Cookies, Store Bought Juices, Alcohol in Excess of  1-2 drinks a day, Lemonade,  Artificial Sweeteners, Doughnuts, Coffee Creamers, "Sugar-free" Products, etc, etc.  This is not a complete list..... ? ?Exercise: If you are able: 30 -60 minutes a day ,4 days a week, or 150 minutes a week.  The longer the better.  Combine stretch, strength, and aerobic activities.  If you were told in the past that you have high risk for cardiovascular diseases, you may seek evaluation by your heart doctor prior to initiating moderate to intense exercise programs. ? ?Handouts Provided Include  ?Lifestyle Nutrition,  ?Meal Plan Card ?Calorie density of foods. ? ?Learning Style & Readiness for Change ?Teaching method utilized: Visual & Auditory  ?Demonstrated degree of understanding via: Teach Back  ?Barriers to learning/adherence to lifestyle change: Mobility ? ?Goals Established by Pt ?Goals ? ?Eat three meals per day ?Increase plant based foods ?Drink only water ?Choose plant based lifestyle ?Eat meals on time ?Cut out snacks between meals and after supper. ?Lose 1./2-1 lb per week. ? ? ?MONITORING & EVALUATION ?Dietary intake, weekly physical activity, and weight and blood sugars in 1 month. ? ?Next Steps  ?Patient is to request referral to [facility] for thryoid issues. ? ? ?

## 2021-10-31 NOTE — Telephone Encounter (Signed)
Vm left to remind her to request referral to [facility] for her thyroid issues. ?

## 2021-11-17 ENCOUNTER — Telehealth: Payer: Self-pay | Admitting: Internal Medicine

## 2021-11-17 ENCOUNTER — Encounter: Payer: Medicare Other | Admitting: Internal Medicine

## 2021-11-17 NOTE — Telephone Encounter (Signed)
Called and patient is able to come to [facility] office next Friday 11/23/21.  ?  ?Appt rescheduled. Nothing further needed  ?

## 2021-11-17 NOTE — Telephone Encounter (Signed)
Pt scheduled 5/11 and we had to cancel appt.  Scheduled for hospital follow-up but next available in [place] was 6/6 and 6/20 in [place].  Pt is available to come in next week M, Th, Fri at [place] office.  Please advise  ? ? ?Dr. Melvyn Novas please advise. This patient was scheduled to see [place] today in [facility] but had to send her home due to building emergency ?

## 2021-11-17 NOTE — Telephone Encounter (Signed)
I still see slots open for 5/12  after lunch - I'm ok working extra that day and next Friday the 17th also if needed. ?

## 2021-11-21 ENCOUNTER — Telehealth: Payer: Self-pay

## 2021-11-21 DIAGNOSIS — K7460 Unspecified cirrhosis of liver: Secondary | ICD-10-CM

## 2021-11-21 NOTE — Telephone Encounter (Signed)
Pt called office and LMOVM, she received recall letter for [place] abd RUQ.  ? ?[place] abd RUQ scheduled for 11/25/21 at 9:30am. NPO after midnight prior to test. ? ?Called pt informed her of [place] appt. ?

## 2021-11-23 ENCOUNTER — Inpatient Hospital Stay: Payer: Medicare Other | Admitting: Internal Medicine

## 2021-11-25 ENCOUNTER — Telehealth: Payer: Self-pay | Admitting: Internal Medicine

## 2021-11-25 ENCOUNTER — Ambulatory Visit (INDEPENDENT_AMBULATORY_CARE_PROVIDER_SITE_OTHER): Payer: Medicare Other | Admitting: Internal Medicine

## 2021-11-25 ENCOUNTER — Ambulatory Visit (HOSPITAL_COMMUNITY): Payer: Medicare Other

## 2021-11-25 ENCOUNTER — Encounter: Payer: Self-pay | Admitting: Internal Medicine

## 2021-11-25 VITALS — BP 142/80 | HR 102 | Temp 97.7°F | Ht 63.0 in | Wt 301.0 lb

## 2021-11-25 DIAGNOSIS — R0602 Shortness of breath: Secondary | ICD-10-CM

## 2021-11-25 DIAGNOSIS — R0902 Hypoxemia: Secondary | ICD-10-CM | POA: Diagnosis not present

## 2021-11-25 DIAGNOSIS — J9611 Chronic respiratory failure with hypoxia: Secondary | ICD-10-CM

## 2021-11-25 DIAGNOSIS — R918 Other nonspecific abnormal finding of lung field: Secondary | ICD-10-CM | POA: Diagnosis not present

## 2021-11-25 DIAGNOSIS — R058 Other specified cough: Secondary | ICD-10-CM

## 2021-11-25 MED ORDER — BUDESONIDE-FORMOTEROL FUMARATE 80-4.5 MCG/ACT IN AERO
INHALATION_SPRAY | RESPIRATORY_TRACT | 12 refills | Status: AC
Start: 2021-11-25 — End: ?

## 2021-11-25 MED ORDER — BREZTRI AEROSPHERE 160-9-4.8 MCG/ACT IN AERO: 2.0000 | INHALATION_SPRAY | Freq: Two times a day (BID) | RESPIRATORY_TRACT | 0 refills | Status: DC

## 2021-11-25 MED ORDER — METHYLPREDNISOLONE ACETATE 80 MG/ML IJ SUSP
120.0000 mg | Freq: Once | INTRAMUSCULAR | Status: AC
  Administered 2021-11-25: 120 mg via INTRAMUSCULAR

## 2021-11-25 MED ORDER — TRAMADOL HCL 50 MG PO TABS: ORAL_TABLET | ORAL | 0 refills | Status: DC

## 2021-11-25 NOTE — Patient Instructions (Addendum)
Depomedrol 120 mg IM today   Plan A = Automatic = Always=    Symbicort 80 Take 2 puffs first thing in am and then another 2 puffs about 12 hours later.    Work on inhaler technique:  relax and gently blow all the way out then take a nice smooth full deep breath back in, triggering the inhaler at same time you start breathing in.  Hold for up to 5 seconds if you can. Blow out thru nose. Rinse and gargle with water when done.  If mouth or throat bother you at all,  try brushing teeth/gums/tongue with arm and hammer toothpaste/ make a slurry and gargle and spit out.   - remember how a golf warms up   Plan B = Backup (to supplement plan A, not to replace it) Only use your albuterol inhaler as a rescue medication to be used if you can't catch your breath by resting or doing a relaxed purse lip breathing pattern.  - The less you use it, the better it will work when you need it. - Ok to use the inhaler up to 2 puffs  every 4 hours if you must but call for appointment if use goes up over your usual need - Don't leave home without it !!  (think of it like the spare tire for your car)   Plan C = Crisis (instead of Plan B but only if Plan B stops working) - only use your albuterol nebulizer if you first try Plan B and it fails to help > ok to use the nebulizer up to every 4 hours but if start needing it regularly call for immediate appointment  The goal for 02 is to keep you above 90% at all times   Omeprazole 20 mg x 2  x 30 min before your first and last meal   GERD (REFLUX)  is an extremely common cause of respiratory symptoms just like yours , many times with no obvious heartburn at all.    It can be treated with medication, but also with lifestyle changes including elevation of the head of your bed (ideally with 6 -8inch blocks under the headboard of your bed),  Smoking cessation, avoidance of late meals, excessive alcohol, and avoid fatty foods, chocolate, peppermint, colas, red wine, and acidic  juices such as orange juice.  NO MINT OR MENTHOL PRODUCTS SO NO COUGH DROPS  USE SUGARLESS CANDY INSTEAD (Jolley ranchers or Stover's or Life Savers) or even ice chips will also do - the key is to swallow to prevent all throat clearing. NO OIL BASED VITAMINS - use powdered substitutes.  Avoid fish oil when coughing.   Take delsym two tsp (mucinex dm is ok too) every 12 hours and supplement if needed with  tramadol 50 mg up to 1-2 every 4 hours to suppress the urge to cough. Swallowing water and/or using ice chips/non mint and menthol containing candies (such as lifesavers or sugarless jolly ranchers) are also effective.  You should rest your voice and avoid activities that you know make you cough.  Once you have eliminated the cough for 3 straight days try reducing the tramadol first,  then the delsym as tolerated.    Please schedule a follow up office visit in 4 weeks, sooner if needed

## 2021-11-25 NOTE — Telephone Encounter (Signed)
Pharmacy called back asking for more clarification- ok from Dr. Melvyn Novas- Verbal orders given. Nothing further needed

## 2021-11-25 NOTE — Telephone Encounter (Signed)
Yolanda [name] is asking if they can put additional 8 tablets for 24 hour dosing to meet with the [place] controlled substance guideline.  Pt hasn't had any pain medicine in the last 30 days.  Please advise.   Dr. Melvyn Novas please advise can you fix rx for tramadol?

## 2021-11-25 NOTE — Telephone Encounter (Signed)
Done - be sure they got it at their end

## 2021-11-25 NOTE — Progress Notes (Signed)
Yolanda [name], female    DOB: 01/14/1956,   MRN: [id number]   Brief patient profile:  48  yowf  minimal smoker  referred to pulmonary clinic in [place]  11/25/2021  s/p admit   Admit date: 09/20/2021 Discharge date: 09/22/2021    Brief Hospitalization Summary: Please see all hospital notes, images, labs for full details of the hospitalization. Yolanda [name] is a 66 y.o. female with medical history significant of hepatic cirrhosis, psoriatic arthritis, rheumatoid arthritis on Remicade per rheumatology morbid obesity, GERD who presentedto the emergency department due to cough with bloodstained sputum/clots which she noted one day PTA.  Patient states that she was having some cough for about 2 weeks and she went to her PCP who started her on prednisone and a cough medicine (did not remember the name), patient states that the prednisone helped, but symptoms returned after completing the prednisone pack.  She started to notice blood in sputum about a week PTA so she was referred to the ED for "lung scan".  At that time, she was provided with breathing treatment and IV hydration and she was discharged home with Tessalon 100 mg capsule and Medrol dosepak. ONe day PTA patient noted some blood and clots after having a cough fit, so she returned to the ED for further evaluation and management.  She endorsed prior history of PE during which she was on anticoagulation, however,  not on any anticoagulation prior to admit.  She denies fever, chills, headache, blurry vision, chest pain, nausea, vomiting, abdominal pain.   ED course: In the emergency department, she was hemodynamically stable, however, oxygen drops to 88% on ambulation.  Work-up in the ED shows normal CBC except for mild leukocytosis, normal BMP except for hyperglycemia, BNP 46, troponin x2 was negative.  Influenza A, B, SARS coronavirus 2 were negative. CT angiography chest with contrast showed no evidence of significant pulmonary  embolus.  Patchy airspace disease demonstrated in both lungs, consider multifocal pneumonia versus alveolar hemorrhage. She was treated with IV Solu-Medrol 125 mg x 1, breathing treatment was provided.  Hospitalist was asked to admit patient for further evaluation and management.   09/22/2021:  Pt feeling much better, breathing better, blood sugars much better controlled.   HHPT recommended.  Ambulatory referral to outpatient diabetes and nutrition made.  Follow up with PCP.    Hospital course by problem list   Assessment and Plan: * Pulmonary alveolar hemorrhage It is unknown at this time the cause of patient's blood in sputum/blood clot.  It may be due to her persistent cough. Considering her hypoxia on ambulation, consulted with pulmonology.  She is doing better now and no recurrence.  She is clinically much improved.        Multifocal pneumonia CT of chest was suggestive of multifocal pneumonia Patient will be empirically started on IV ceftriaxone and doxycycline and clinically much improved.  Continue Tylenol as needed Continue Mucinex, incentive spirometry, flutter valve        Hypothyroidism Continue Synthroid   Morbid obesity ([place]) BMI 52.74; patient requires diet and lifestyle modification Patient will follow-up with outpatient PCP for weight loss program Ambulatory referral to outpatient diabetes and nutrition center   GERD Continue Protonix   Essential hypertension Uncontrolled: Continue Benicar  Pulmonary inpt eva  09/20/21  1) CAP with acute mild hypoxemic resp failure - 3/14  PCT < 0.10  - 3/14  Strep pneumo neg - 3/14 Legionella Ag  >>>neg  >>> agree with empiric rx  2) epistaxis/ hemoptysis in setting of cirrhosis/ transient thrombocyctopenia      >>>  INR  1.3  no heparin >>>   ANA neg, Anti GBM neg and ANCA titers neg  esr 69 >>>   Sinus CT neg  REC u/a not done   3) Obesity with reduced ERV on last pft and no indication of  copd  PFT's  02/23/16   nl flows p am symbicort  ERV 20%  at wt 262   - could still have Asthma though not clinically evident on exam today     History of Present Illness  11/25/2021  Pulmonary/ 1st office eval/ [name] / [facility]  psoriatic/rheumatoid  arthritis on remicade  Chief Complaint  Patient presents with   Consult    Was seen in [place] at [facility] by Dr. Melvyn Novas for pneumonia   Took allergy shots in her 91s to 12's seemed to help but still needed albuterol in both hfa and neb form since 50s at wt around 240 and use went way up around dec 2022    Dyspnea:  25 ft  Cough: severe but no more epistaxis or hemoptysis  Sleep: 45 degrees  SABA use: once on day of ov > 6 h prior to OV    No obvious day to day or daytime variability or assoc excess/ purulent sputum or mucus plugs or hemoptysis or cp or chest tightness, subjective wheeze or overt sinus or hb symptoms.   Sleeping as above without nocturnal  or early am exacerbation  of respiratory  c/o's or need for noct saba. Also denies any obvious fluctuation of symptoms with weather or environmental changes or other aggravating or alleviating factors except as outlined above   No unusual exposure hx or h/o childhood pna/ asthma or knowledge of premature birth.  Current Allergies, Complete Past Medical History, Past Surgical History, Family History, and Social History were reviewed in [employer] record.  ROS  The following are not active complaints unless bolded Hoarseness, sore throat, dysphagia, dental problems, itching, sneezing,  nasal congestion or discharge of excess mucus or purulent secretions, ear ache,   fever, chills, sweats, unintended wt loss or wt gain, classically pleuritic or exertional cp,  orthopnea pnd or arm/hand swelling  or leg swelling, presyncope, palpitations, abdominal pain, anorexia, nausea, vomiting, diarrhea  or change in bowel habits or change in bladder habits, change in stools or change in urine, dysuria,  hematuria,  rash, arthralgias, visual complaints, headache, numbness, weakness or ataxia or problems with walking or coordination,  change in mood or  memory.           Past Medical History:  Diagnosis Date   Abnormal uterine bleeding    Anxiety    Arthritis    Calculus of kidney 07/09/2008   Qualifier: Diagnosis of  By: Kellie Simmering LPN, Almyra Free     Chronic abdominal pain    Chronic pain in left foot    Clotting disorder ([place])    Depression    Elevated liver function tests 2018   Endometriosis    Family history of adverse reaction to anesthesia    Fatty liver    Fibromyalgia    Gastritis    GERD 12/21/2009   Qualifier: Diagnosis of  By: Craige Cotta     History of kidney stones    Hypertension    stopped meds in Aug 2015   Hypothyroidism    IBS (irritable bowel syndrome)    Internal hemorrhoids  Kidney stone    Obesity, morbid ([place]) 09/11/2012   PE 07/09/2008   Qualifier: Diagnosis of  By: Kellie Simmering LPN, Almyra Free     PE (pulmonary embolism)    PONV (postoperative nausea and vomiting)    Sleep apnea    Thyroid disease    Vitamin D deficiency     Outpatient Medications Prior to Visit  Medication Sig Dispense Refill   acetaminophen (TYLENOL) 500 MG tablet Take 500-1,000 mg by mouth every 6 (six) hours as needed for moderate pain or headache.     albuterol (VENTOLIN HFA) 108 (90 Base) MCG/ACT inhaler Inhale 1-2 puffs into the lungs every 6 (six) hours as needed for wheezing or shortness of breath. 6.7 g 0   aspirin EC 81 MG tablet Take 81 mg by mouth daily.     bisacodyl (BISACODYL) 5 MG EC tablet Take 10 mg by mouth daily as needed.     blood glucose meter kit and supplies Dispense based on patient and insurance preference. Use up to four times daily as directed. (FOR ICD-10 E10.9, E11.9). 1 each 0   Cholecalciferol (VITAMIN D3) 2000 units TABS Take 2,000 Units by mouth daily.      cyclobenzaprine (FLEXERIL) 10 MG tablet Take 10 mg by mouth daily.     DULoxetine (CYMBALTA) 60 MG  capsule Take 60 mg by mouth daily.     inFLIXimab (REMICADE IV) Inject into the vein. As directed     Levothyroxine Sodium 100 MCG CAPS Take 100 mcg by mouth daily before breakfast.      meclizine (ANTIVERT) 25 MG tablet Take 25 mg by mouth 3 (three) times daily as needed for dizziness.     Melatonin 10 MG CAPS Take 10 mg by mouth at bedtime as needed (sleep).     metFORMIN (GLUCOPHAGE-XR) 500 MG 24 hr tablet 1 po with supper x 1 week, then 1 po BID WC x 1 week, then 2 po BID WC 120 tablet 1   olmesartan-hydrochlorothiazide (BENICAR HCT) 40-25 MG tablet Take 1 tablet by mouth daily.     Omega-3 Fatty Acids (FISH OIL) 1000 MG CAPS Take 1,000 mg by mouth daily.      omeprazole (PRILOSEC) 20 MG capsule Take 1 capsule (20 mg total) by mouth 2 (two) times daily before a meal. 1 po 30 mins prior to [place]. (Patient taking differently: Take 20 mg by mouth 2 (two) times daily as needed (gerd).) 180 capsule 3      10 tablet 0   rosuvastatin (CRESTOR) 10 MG tablet Take 10 mg by mouth daily.     traMADol-acetaminophen (ULTRACET) 37.5-325 MG tablet Take 1-2 tablets by mouth every 6 (six) hours as needed for moderate pain.     Turmeric 500 MG TABS Take 500 mg by mouth daily.         Objective:     BP (!) 142/80 (BP Location: Left Arm, Patient Position: Sitting)   Pulse (!) 102   Temp 97.7 F (36.5 C) (Temporal)   Ht _0  (1.6 m)   Wt (!) 301 lb (136.5 kg)   LMP 07/11/2007 (Approximate)   SpO2 94% Comment: ra  BMI 53.32 kg/m   SpO2: 94 % (ra)  obese amb wf /voice fatigue and classic pseuowheeze/ severe dry sounding harsh upper airway patterns cough    HEENT : Oropharynx  clear   Nasal turbintes nl    NECK :  without  appent JVD/ palpable Nodes/TM    LUNGS: no acc muscle  use,  Nl contour chest which is clear to A and P bilaterally though BS are distant  without cough on insp or exp maneuvers   CV:  RRR  no s3 or murmur or increase in P2, and no edema   ABD:  massively obese but soft  and nontender with limited  inspiratory excursion I   MS:  Nl gait/ ext warm without deformities Or obvious joint restrictions  calf tenderness, cyanosis or clubbing     SKIN: warm and dry with chronic mod venous stasis changes both LEs  NEURO:  alert, approp, nl sensorium with  no motor or cerebellar deficits apparent.       Assessment   Chronic respiratory failure with hypoxia ([place]) HC03   09/22/21    = 30 11/25/2021    Patient walked at a slow pace on room air  At end of lap 2 (300 ft) patient sats dropped to 86% on room air. Placed patient on 2LO2 cont and sats raised to 94% and walked another 150 ft with lowest sats 91%  Suspect this is actually longstanding problem treated with albuterol prn sob (and sitting down) where she actually needs 02 with exertion and more paced ex to get her into neg calorie balance if at all possible.   Advise: 2lpm hs for now and with for sats < 90% with activity   Pulmonary infiltrates Assoc with hemoptysis and ? alv hem on CTa  09/20/21  W/u inpt 09/20/21 -  INR  1.3  -   BNP  46 -  ANA neg, Anti GBM neg and ANCA titers neg  esr 69 -  Sinus CT neg  REC u/a not done  Assoc with inflammatory arthritis or remicade with high ESR suggests this could have been CAP and have not excluded alv hem but hopefully this is just CAP / boop pattern here and won't need longer course of pred or lead to recurrent pulmonary flares  - needs u/a to complete the w/u and will be advised.  For now depomedrol 120 mg IM and focus on her Upper airway issues (see separate a/p)   Upper airway cough syndrome vs cough variant asthma Onset in her 10's/ prev on allergy shots ? Helped but freq need for saba since then mostly with exertions - PFT's  02/23/16  nl flows p am symbicort  ERV 20%  at wt 916   - cyclical cough rx 3/84/6659 >>>  Of the three most common causes of  Sub-acute / recurrent or chronic cough, only one (GERD)  can actually contribute to/ trigger  the other two  (asthma and post nasal drip syndrome)  and perpetuate the cylce of cough.  While not intuitively obvious, many patients with chronic low grade reflux do not cough until there is a primary insult that disturbs the protective epithelial barrier and exposes sensitive nerve endings.   This is typically viral but can due to PNDS and  either may apply here.   >>>    The point is that once this occurs, it is difficult to eliminate the cycle  using anything but a maximally effective acid suppression regimen at least in the short run, accompanied by an appropriate diet to address non acid GERD and control / eliminate the cough itself for at least 3-5 days with tramadol and added low dose symbicort to cover ? Of asthma component    - The proper method of use, as well as anticipated side effects, of a metered-dose inhaler were  discussed and demonstrated to the patient using teach back method      F/u q 4 weeks planned - call sooner if needed    Each maintenance medication was reviewed in detail including emphasizing most importantly the difference between maintenance and prns and under what circumstances the prns are to be triggered using an action plan format where appropriate.  Total time for H and P, chart review, counseling, reviewing hfa device(s) , directly observing portions of ambulatory 02 saturation study/ and generating customized AVS unique to this office visit / same day charting > 40 min post hosp f/u ov                   Christinia Gully, MD 11/25/2021

## 2021-11-27 ENCOUNTER — Encounter: Payer: Self-pay | Admitting: Internal Medicine

## 2021-11-27 DIAGNOSIS — J9611 Chronic respiratory failure with hypoxia: Secondary | ICD-10-CM | POA: Insufficient documentation

## 2021-11-27 DIAGNOSIS — R058 Other specified cough: Secondary | ICD-10-CM | POA: Insufficient documentation

## 2021-11-27 DIAGNOSIS — R918 Other nonspecific abnormal finding of lung field: Secondary | ICD-10-CM | POA: Insufficient documentation

## 2021-11-27 NOTE — Assessment & Plan Note (Addendum)
Assoc with hemoptysis and ? alv hem on CTa  09/20/21  W/u inpt 09/20/21 -  INR  1.3  -   BNP  46 -  ANA neg, Anti GBM neg and ANCA titers neg  esr 69 -  Sinus CT neg  REC u/a not done  Assoc with inflammatory arthritis or remicade with high ESR suggests this could have been CAP and have not excluded alv hem but hopefully this is just CAP / boop pattern here and won't need longer course of pred or lead to recurrent pulmonary flares  - needs u/a to complete the w/u and will be advised.  For now depomedrol 120 mg IM and focus on her Upper airway issues (see separate a/p)

## 2021-11-27 NOTE — Assessment & Plan Note (Addendum)
HC03   09/22/21    = 30 11/25/2021    Patient walked at a slow pace on room air  At end of lap 2 (300 ft) patient sats dropped to 86% on room air. Placed patient on 2LO2 cont and sats raised to 94% and walked another 150 ft with lowest sats 91%  Suspect this is actually longstanding problem treated with albuterol prn sob (and sitting down) where she actually needs 02 with exertion and more paced ex to get her into neg calorie balance if at all possible.   Advise: 2lpm hs for now and with for sats < 90% with activity

## 2021-11-27 NOTE — Assessment & Plan Note (Signed)
Onset in her 10's/ prev on allergy shots ? Helped but freq need for saba since then mostly with exertions - PFT's  02/23/16  nl flows p am symbicort  ERV 20%  at wt 628   - cyclical cough rx 3/66/2947 >>>  Of the three most common causes of  Sub-acute / recurrent or chronic cough, only one (GERD)  can actually contribute to/ trigger  the other two (asthma and post nasal drip syndrome)  and perpetuate the cylce of cough.  While not intuitively obvious, many patients with chronic low grade reflux do not cough until there is a primary insult that disturbs the protective epithelial barrier and exposes sensitive nerve endings.   This is typically viral but can due to PNDS and  either may apply here.   >>>    The point is that once this occurs, it is difficult to eliminate the cycle  using anything but a maximally effective acid suppression regimen at least in the short run, accompanied by an appropriate diet to address non acid GERD and control / eliminate the cough itself for at least 3-5 days with tramadol and added low dose symbicort to cover ? Of asthma component    - The proper method of use, as well as anticipated side effects, of a metered-dose inhaler were discussed and demonstrated to the patient using teach back method      F/u q 4 weeks planned - call sooner if needed    Each maintenance medication was reviewed in detail including emphasizing most importantly the difference between maintenance and prns and under what circumstances the prns are to be triggered using an action plan format where appropriate.  Total time for H and P, chart review, counseling, reviewing hfa device(s) , directly observing portions of ambulatory 02 saturation study/ and generating customized AVS unique to this office visit / same day charting > 40 min post hosp f/u ov

## 2021-11-29 ENCOUNTER — Telehealth: Payer: Self-pay | Admitting: Internal Medicine

## 2021-11-29 ENCOUNTER — Other Ambulatory Visit (HOSPITAL_COMMUNITY): Payer: Self-pay

## 2021-11-29 NOTE — Telephone Encounter (Signed)
PA team, is this something you could help [place] with in terms of a cheaper alternative to symbicort inhaler so I can send some options to Dr. Melvyn Novas and the patient?

## 2021-11-29 NOTE — Telephone Encounter (Signed)
Called and spoke with patient who states that she would like to have Symbicort filled and with the total she should meet her deductible. I called the pharmacy to see if they still had it on file. They did I advised them to fill it for the patient. I am also mailing her patient assistance paperwork if needed. Nothing further needed at this time.

## 2021-11-29 NOTE — Telephone Encounter (Signed)
Called and spoke to patient. She is going to call her insurance company and verify what inhaler options are covered and will call us back with options to send to Dr. Melvyn Novas

## 2021-12-06 ENCOUNTER — Ambulatory Visit: Payer: Medicare Other | Admitting: Nutrition

## 2021-12-06 ENCOUNTER — Other Ambulatory Visit: Payer: Self-pay

## 2021-12-06 ENCOUNTER — Encounter (HOSPITAL_COMMUNITY): Payer: Self-pay | Admitting: Emergency Medicine

## 2021-12-06 ENCOUNTER — Emergency Department (HOSPITAL_COMMUNITY): Payer: Medicare Other

## 2021-12-06 ENCOUNTER — Observation Stay (HOSPITAL_COMMUNITY)
Admission: EM | Admit: 2021-12-06 | Discharge: 2021-12-07 | Disposition: A | Discharge status: Home / Self Care | Payer: Medicare Other | Attending: Emergency Medicine | Admitting: Emergency Medicine

## 2021-12-06 DIAGNOSIS — G250 Essential tremor: Secondary | ICD-10-CM

## 2021-12-06 DIAGNOSIS — I10 Essential (primary) hypertension: Secondary | ICD-10-CM | POA: Diagnosis not present

## 2021-12-06 DIAGNOSIS — J4520 Mild intermittent asthma, uncomplicated: Secondary | ICD-10-CM | POA: Diagnosis not present

## 2021-12-06 DIAGNOSIS — R770 Abnormality of albumin: Secondary | ICD-10-CM | POA: Insufficient documentation

## 2021-12-06 DIAGNOSIS — J45909 Unspecified asthma, uncomplicated: Secondary | ICD-10-CM | POA: Diagnosis not present

## 2021-12-06 DIAGNOSIS — Z7984 Long term (current) use of oral hypoglycemic drugs: Secondary | ICD-10-CM | POA: Insufficient documentation

## 2021-12-06 DIAGNOSIS — N3001 Acute cystitis with hematuria: Secondary | ICD-10-CM | POA: Insufficient documentation

## 2021-12-06 DIAGNOSIS — E7220 Disorder of urea cycle metabolism, unspecified: Secondary | ICD-10-CM | POA: Insufficient documentation

## 2021-12-06 DIAGNOSIS — R4182 Altered mental status, unspecified: Secondary | ICD-10-CM | POA: Diagnosis not present

## 2021-12-06 DIAGNOSIS — R410 Disorientation, unspecified: Secondary | ICD-10-CM

## 2021-12-06 DIAGNOSIS — E039 Hypothyroidism, unspecified: Secondary | ICD-10-CM | POA: Diagnosis not present

## 2021-12-06 DIAGNOSIS — R718 Other abnormality of red blood cells: Secondary | ICD-10-CM | POA: Diagnosis not present

## 2021-12-06 DIAGNOSIS — L4050 Arthropathic psoriasis, unspecified: Secondary | ICD-10-CM

## 2021-12-06 DIAGNOSIS — K7682 Hepatic encephalopathy: Secondary | ICD-10-CM | POA: Diagnosis not present

## 2021-12-06 DIAGNOSIS — K219 Gastro-esophageal reflux disease without esophagitis: Secondary | ICD-10-CM | POA: Diagnosis present

## 2021-12-06 DIAGNOSIS — Z7951 Long term (current) use of inhaled steroids: Secondary | ICD-10-CM | POA: Insufficient documentation

## 2021-12-06 DIAGNOSIS — Z79899 Other long term (current) drug therapy: Secondary | ICD-10-CM | POA: Insufficient documentation

## 2021-12-06 DIAGNOSIS — G4730 Sleep apnea, unspecified: Secondary | ICD-10-CM

## 2021-12-06 DIAGNOSIS — R739 Hyperglycemia, unspecified: Secondary | ICD-10-CM

## 2021-12-06 DIAGNOSIS — N390 Urinary tract infection, site not specified: Secondary | ICD-10-CM

## 2021-12-06 DIAGNOSIS — R531 Weakness: Secondary | ICD-10-CM

## 2021-12-06 DIAGNOSIS — E46 Unspecified protein-calorie malnutrition: Secondary | ICD-10-CM | POA: Insufficient documentation

## 2021-12-06 DIAGNOSIS — E119 Type 2 diabetes mellitus without complications: Secondary | ICD-10-CM | POA: Insufficient documentation

## 2021-12-06 DIAGNOSIS — Z6841 Body Mass Index (BMI) 40.0 and over, adult: Secondary | ICD-10-CM

## 2021-12-06 DIAGNOSIS — Z86711 Personal history of pulmonary embolism: Secondary | ICD-10-CM | POA: Insufficient documentation

## 2021-12-06 DIAGNOSIS — N3000 Acute cystitis without hematuria: Secondary | ICD-10-CM

## 2021-12-06 DIAGNOSIS — B961 Klebsiella pneumoniae [K. pneumoniae] as the cause of diseases classified elsewhere: Secondary | ICD-10-CM | POA: Insufficient documentation

## 2021-12-06 DIAGNOSIS — E8809 Other disorders of plasma-protein metabolism, not elsewhere classified: Secondary | ICD-10-CM

## 2021-12-06 DIAGNOSIS — R569 Unspecified convulsions: Secondary | ICD-10-CM

## 2021-12-06 LAB — BASIC METABOLIC PANEL
Anion gap: 4 — ABNORMAL LOW (ref 5–15)
BUN: 11 mg/dL (ref 8–23)
CO2: 30 mmol/L (ref 22–32)
Calcium: 8.2 mg/dL — ABNORMAL LOW (ref 8.9–10.3)
Chloride: 101 mmol/L (ref 98–111)
Creatinine, Ser: 0.62 mg/dL (ref 0.44–1.00)
GFR, Estimated: >60 mL/min (ref >60)
Glucose, Bld: 106 mg/dL — ABNORMAL HIGH (ref 70–99)
Potassium: 4 mmol/L (ref 3.5–5.1)
Sodium: 135 mmol/L (ref 135–145)

## 2021-12-06 LAB — HEPATIC FUNCTION PANEL
ALT: 36 U/L (ref 0–44)
AST: 58 U/L — ABNORMAL HIGH (ref 15–41)
Albumin: 1.9 g/dL — ABNORMAL LOW (ref 3.5–5.0)
Alkaline Phosphatase: 80 U/L (ref 38–126)
Bilirubin, Direct: 0.3 mg/dL — ABNORMAL HIGH (ref 0.0–0.2)
Indirect Bilirubin: 0.9 mg/dL (ref 0.3–0.9)
Total Bilirubin: 1.2 mg/dL (ref 0.3–1.2)
Total Protein: 8.5 g/dL — ABNORMAL HIGH (ref 6.5–8.1)

## 2021-12-06 LAB — BLOOD GAS, VENOUS
Acid-Base Excess: 6.6 mmol/L — ABNORMAL HIGH (ref 0.0–2.0)
Bicarbonate: 33.3 mmol/L — ABNORMAL HIGH (ref 20.0–28.0)
Drawn by: 61882
FIO2: 21 %
O2 Saturation: 27.7 %
Patient temperature: 36.5
pCO2, Ven: 54 mmHg (ref 44–60)
pH, Ven: 7.4 (ref 7.25–7.43)
pO2, Ven: <31 mmHg — CL (ref 32–45)

## 2021-12-06 LAB — RAPID URINE DRUG SCREEN, HOSP PERFORMED
Amphetamines: NOT DETECTED
Barbiturates: NOT DETECTED
Benzodiazepines: NOT DETECTED
Cocaine: NOT DETECTED
Opiates: NOT DETECTED
Tetrahydrocannabinol: NOT DETECTED

## 2021-12-06 LAB — CBC
HCT: 42.5 % (ref 36.0–46.0)
Hemoglobin: 14 g/dL (ref 12.0–15.0)
MCH: 33.3 pg (ref 26.0–34.0)
MCHC: 32.9 g/dL (ref 30.0–36.0)
MCV: 101 fL — ABNORMAL HIGH (ref 80.0–100.0)
Platelets: 138 10*3/uL — ABNORMAL LOW (ref 150–400)
RBC: 4.21 MIL/uL (ref 3.87–5.11)
RDW: 13.7 % (ref 11.5–15.5)
WBC: 9 10*3/uL (ref 4.0–10.5)
nRBC: 0 % (ref 0.0–0.2)

## 2021-12-06 LAB — URINALYSIS, ROUTINE W REFLEX MICROSCOPIC
Bilirubin Urine: NEGATIVE
Glucose, UA: NEGATIVE mg/dL
Ketones, ur: NEGATIVE mg/dL
Nitrite: NEGATIVE
Protein, ur: NEGATIVE mg/dL
Renal Epithelial: 1
Specific Gravity, Urine: 1.016 (ref 1.005–1.030)
WBC, UA: >50 WBC/hpf — ABNORMAL HIGH (ref 0–5)
pH: 6 (ref 5.0–8.0)

## 2021-12-06 LAB — AMMONIA: Ammonia: 42 umol/L — ABNORMAL HIGH (ref 9–35)

## 2021-12-06 LAB — ETHANOL: Alcohol, Ethyl (B): <10 mg/dL (ref <10)

## 2021-12-06 LAB — GLUCOSE, CAPILLARY: Glucose-Capillary: 74 mg/dL (ref 70–99)

## 2021-12-06 MED ORDER — LACTULOSE 10 GM/15ML PO SOLN
20.0000 g | Freq: Three times a day (TID) | ORAL | Status: DC
  Administered 2021-12-06 – 2021-12-07 (×2): 20 g via ORAL
  Filled 2021-12-06 (×2): qty 30

## 2021-12-06 MED ORDER — GLUCERNA SHAKE PO LIQD: 237.0000 mL | Freq: Three times a day (TID) | ORAL | Status: DC

## 2021-12-06 MED ORDER — ASPIRIN 81 MG PO TBEC
81.0000 mg | DELAYED_RELEASE_TABLET | Freq: Every day | ORAL | Status: DC
  Administered 2021-12-07: 81 mg via ORAL
  Filled 2021-12-06: qty 1

## 2021-12-06 MED ORDER — FLUCONAZOLE 150 MG PO TABS
150.0000 mg | ORAL_TABLET | Freq: Once | ORAL | Status: AC
  Administered 2021-12-06: 150 mg via ORAL
  Filled 2021-12-06: qty 1

## 2021-12-06 MED ORDER — SODIUM CHLORIDE 0.9 % IV SOLN
1.0000 g | Freq: Once | INTRAVENOUS | Status: AC
  Administered 2021-12-06: 1 g via INTRAVENOUS

## 2021-12-06 MED ORDER — SODIUM CHLORIDE 0.9 % IV SOLN
1.0000 g | INTRAVENOUS | Status: DC
  Administered 2021-12-07: 1 g via INTRAVENOUS
  Filled 2021-12-06: qty 10

## 2021-12-06 MED ORDER — ACETAMINOPHEN 325 MG PO TABS: 650.0000 mg | ORAL_TABLET | Freq: Four times a day (QID) | ORAL | Status: DC | PRN

## 2021-12-06 MED ORDER — ACETAMINOPHEN 650 MG RE SUPP: 650.0000 mg | Freq: Four times a day (QID) | RECTAL | Status: DC | PRN

## 2021-12-06 MED ORDER — CEFTRIAXONE SODIUM 1 G IJ SOLR: 1.0000 g | Freq: Once | INTRAMUSCULAR | 0 refills | Status: DC

## 2021-12-06 NOTE — ED Notes (Signed)
Pt attempted to collect urine sample, missed collection hat. Will try again when she feels the urge to urinate.

## 2021-12-06 NOTE — ED Notes (Signed)
Beeped neurology through [facility] per PA's request.

## 2021-12-06 NOTE — H&P (Signed)
History and Physical    Patient: Yolanda [name] QZE:[id number] DOB: [birth year] DOA: 12/06/2021 DOS: the patient was seen and examined on 12/07/2021 PCP: Celene Squibb, MD  Patient coming from: Home  Chief Complaint:  Chief Complaint  Patient presents with   Weakness   HPI: Yolanda [name] is a 66 y.o. female with medical history significant of hepatic cirrhosis, psoriatic arthritis, rheumatoid arthritis, morbid obesity, GERD, OSA on CPAP (noncompliant), hypertension, hyperlipidemia, type 2 diabetes mellitus, hypothyroidism, asthma who presents to the emergency department for evaluation of change in mental status.  Patient was concerned of possible stroke, she states that when she is talking, sometimes her speech turns to gibberish, this has happened intermittently over the course of 1 year.  She also complained of her daughter suspecting her of having seizures, since she was noted 1 day to be shaking while asleep, it took few minutes to be able to wake her up and she was confused for about 5 minutes prior to returning to her baseline of functioning, she has been to neurologist (Dr. Merlene Laughter) who plans to do an EEG on her within the next 1-1/2 weeks. She complained of excessive sleeping (up to 20 hours daily), increased weakness and difficulty in rising up from bed.  She endorsed more than 20 pound weight gain within the last year.  Her new PCP recently reduced (about a week ago) her levothyroxine dosage. Friend at bedside states that patient has also been having some memory problems whereby she turned on a stove 1 day and just walked away, and sometimes she opens her refrigerator and forget what she wanted to get out of the refrigerator and also forget to close her refrigerator.  Patient states that while driving yesterday, she looked to the right prior to making a turn, but instead of turning to the right, she turned to the left.  She realized that this may be dangerous, so she decided not to  drive again until she is able to resolve her memory issues.  Patient also states that when she walks, it appears as if she was having high-stepping gait. She denies chest pain, headache, fever, chills, nausea, vomiting, abdominal pain, constipation or diarrhea. Patient was recently admitted from 3/14 through 3/16 due to pulmonary alveolar hemorrhage during which pulmonology was consulted due to having hypoxia on ambulation.  She was also diagnosed to have multifocal pneumonia which was treated with IV ceftriaxone and doxycycline.  Review of Systems: Review of systems as noted in the HPI. All other systems reviewed and are negative.  ED Course: In the emergency department, she was hemodynamically stable.  Work-up in the ED showed normal CBC except for thrombocytopenia and elevated MCV at 101.0.  BMP was normal.  Albumin 1.9, AST 58, ALT 36, ALP 80, alcohol level was less than 10, urine drug screen was negative, urinalysis showed moderate leukocytes and many bacteria.  Ammonia level was 42. CT of head showed no evidence of acute endocrine abnormality. She was treated with IV Rocephin and fluconazole was also given. Telemetry neurology was consulted and recommended EEG and further stroke work-up.  Hospitalist was asked to admit patient for further evaluation and management.  Review of Systems: Review of systems as noted in the HPI. All other systems reviewed and are negative.    Past Medical History:  Diagnosis Date   Abnormal uterine bleeding    Anxiety    Arthritis    Calculus of kidney 07/09/2008   Qualifier: Diagnosis of  By: Kellie Simmering LPN,  Almyra Free     Chronic abdominal pain    Chronic pain in left foot    Clotting disorder ([place])    Depression    Elevated liver function tests 2018   Endometriosis    Family history of adverse reaction to anesthesia    Fatty liver    Fibromyalgia    Gastritis    GERD 12/21/2009   Qualifier: Diagnosis of  By: Craige Cotta     History of kidney stones     Hypertension    stopped meds in Aug 2015   Hypothyroidism    IBS (irritable bowel syndrome)    Internal hemorrhoids    Kidney stone    Obesity, morbid ([place]) 09/11/2012   PE 07/09/2008   Qualifier: Diagnosis of  By: Kellie Simmering LPN, Almyra Free     PE (pulmonary embolism)    PONV (postoperative nausea and vomiting)    Sleep apnea    Thyroid disease    Vitamin D deficiency    Past Surgical History:  Procedure Laterality Date   ABDOMINAL SURGERY     laparoscopy   BIOPSY N/A 03/24/2014   Procedure: GASTRIC BIOPSIES;  Surgeon: Danie Binder, MD;  Location: [facility];  Service: Endoscopy;  Laterality: N/A;   BIOPSY  10/28/2019   Procedure: BIOPSY;  Surgeon: Danie Binder, MD;  Location: [facility];  Service: Endoscopy;;  gastric nodule   BREAST LUMPECTOMY Right    CESAREAN SECTION     X2   COLONOSCOPY  2006   internal hemorrhoids   COLONOSCOPY WITH PROPOFOL N/A 03/24/2014   Dr. [name]: 2 tubular adenomas removed, hemorrhoids   COLONOSCOPY WITH PROPOFOL N/A 10/28/2019   Fields: External and internal hemorrhoids, 8 polyps ranging from 2 to 5 mm in size removed from the colon.  Multiple tubular adenomas.  Next colonoscopy in 3 years.   CYSTOSCOPY W/ RETROGRADES  01/23/2012   Procedure: CYSTOSCOPY WITH RETROGRADE PYELOGRAM;  Surgeon: Marissa Nestle, MD;  Location: [facility];  Service: Urology;  Laterality: Left;   DILATATION & CURETTAGE/HYSTEROSCOPY WITH MYOSURE N/A 11/18/2014   Procedure: DILATATION & CURETTAGE/HYSTEROSCOPY WITH MYOSURE, resection of polyp;  Surgeon: Cheri Fowler, MD;  Location: [facility];  Service: Gynecology;  Laterality: N/A;   DILATION AND CURETTAGE OF UTERUS     ESOPHAGOGASTRODUODENOSCOPY   08/24/2006   Dr. Veto Kemps erythema of the antrum without erosion or ulcers/Otherwise, normal esophagus without evidence of Barrett's path with chronic gastritis   ESOPHAGOGASTRODUODENOSCOPY (EGD) WITH PROPOFOL N/A 03/24/2014   Dr. [name]: gastritis   ESOPHAGOGASTRODUODENOSCOPY (EGD)  WITH PROPOFOL N/A 10/28/2019   Fields: Esophagus appeared normal, empiric dilation due to history of dysphagia.  6 mm nodule seen in the gastric cardia.  Mild portal hypertensive gastropathy.  Nodule from the stomach biopsied and showed mild chronic gastritis, no H. pylori.   POLYPECTOMY N/A 03/24/2014   Procedure: POLYPECTOMY;  Surgeon: Danie Binder, MD;  Location: [facility];  Service: Endoscopy;  Laterality: N/A;   POLYPECTOMY  10/28/2019   Procedure: POLYPECTOMY;  Surgeon: Danie Binder, MD;  Location: [facility];  Service: Endoscopy;;  hepatic flexure, ascending colon,sigmoid colon, rectal   REMOVAL OF STONES  01/23/2012   Procedure: REMOVAL OF STONES;  Surgeon: Marissa Nestle, MD;  Location: [facility];  Service: Urology;  Laterality: N/A;   SAVORY DILATION N/A 10/28/2019   Procedure: SAVORY DILATION;  Surgeon: Danie Binder, MD;  Location: [facility];  Service: Endoscopy;  Laterality: N/A;   TUBAL LIGATION  Social History:  reports that she quit smoking about 23 years ago. Her smoking use included cigarettes. She has never used smokeless tobacco. She reports that she does not currently use alcohol. She reports that she does not use drugs.   Allergies  Allergen Reactions   Azithromycin Hives   Lisinopril Cough   Sulfonamide Derivatives Other (See Comments)    Vaginal Infection     Family History  Problem Relation Age of Onset   Heart attack Mother        CABG   Stroke Mother    Hypertension Mother    Cancer Mother    Diabetes Mother    Heart attack Father        CABG   Hypertension Father    Mesothelioma Father    Heart attack Brother    Diabetes Brother    Depression Brother    Hyperlipidemia Brother    Diabetes Maternal Grandmother    Diabetes Maternal Grandfather    Cancer Paternal Grandmother    Diabetes Brother    Hyperlipidemia Brother    Wilson's disease Other        2 nieces, one died in her 91s   Colon cancer Neg Hx      Prior to Admission  medications   Medication Sig Start Date End Date Taking? Authorizing Provider  albuterol (VENTOLIN HFA) 108 (90 Base) MCG/ACT inhaler Inhale 1-2 puffs into the lungs every 6 (six) hours as needed for wheezing or shortness of breath. 11/08/19  Yes Long, Wonda Olds, MD  bisacodyl (BISACODYL) 5 MG EC tablet Take 10 mg by mouth daily as needed.   Yes [provider]  budesonide-formoterol (SYMBICORT) 80-4.5 MCG/ACT inhaler Take 2 puffs first thing in am and then another 2 puffs about 12 hours later. 11/25/21  Yes Tanda Rockers, MD  cefTRIAXone (ROCEPHIN) 1 g injection Inject 1 g into the muscle once for 1 dose. 12/06/21 12/06/21 Yes Harris, Vernie Shanks, PA-C  Cholecalciferol (VITAMIN D3) 2000 units TABS Take 2,000 Units by mouth daily.    Yes [provider]  CRANBERRY PO Take 500 mg by mouth daily.   Yes [provider]  cyclobenzaprine (FLEXERIL) 10 MG tablet Take 10 mg by mouth daily.   Yes [provider]  DULoxetine (CYMBALTA) 60 MG capsule Take 60 mg by mouth daily. 07/15/21  Yes [provider]  inFLIXimab (REMICADE IV) Inject into the vein. As directed   Yes [provider]  Levothyroxine Sodium 100 MCG CAPS Take 75 mcg by mouth daily before breakfast.   Yes [provider]  meclizine (ANTIVERT) 25 MG tablet Take 25 mg by mouth 3 (three) times daily as needed for dizziness.   Yes [provider]  Melatonin 10 MG CAPS Take 10 mg by mouth at bedtime as needed (sleep).   Yes [provider]  metFORMIN (GLUCOPHAGE-XR) 500 MG 24 hr tablet 1 po with supper x 1 week, then 1 po BID WC x 1 week, then 2 po BID WC Patient taking differently: Take 500 mg by mouth 2 (two) times daily. 09/22/21  Yes Johnson, Clanford L, MD  olmesartan-hydrochlorothiazide (BENICAR HCT) 40-25 MG tablet Take 1 tablet by mouth daily. 02/20/17  Yes [provider]  omeprazole (PRILOSEC) 20 MG capsule Take 1 capsule (20 mg total) by mouth 2 (two) times  daily before a meal. 1 po 30 mins prior to [place]. Patient taking differently: Take 20 mg by mouth 2 (two) times daily as needed (gerd). 03/09/21  03/09/22 Yes Hurshel Keys K, DO  rosuvastatin (CRESTOR) 10 MG tablet Take 10 mg by mouth daily.   Yes [provider]  sucralfate (CARAFATE) 1 g tablet Take 1 g by mouth 4 (four) times daily.   Yes [provider]  traMADol (ULTRAM) 50 MG tablet 1 to 2 every 4 hours as needed for cough 11/25/21  Yes Wert, Christena Deem, MD  blood glucose meter kit and supplies Dispense based on patient and insurance preference. Use up to four times daily as directed. (FOR ICD-10 E10.9, E11.9). 09/22/21   Johnson, Clanford L, MD  Budeson-Glycopyrrol-Formoterol (BREZTRI AEROSPHERE) 160-9-4.8 MCG/ACT AERO Inhale 2 puffs into the lungs in the morning and at bedtime. Patient not taking: Reported on 12/06/2021 11/25/21   Tanda Rockers, MD    Physical Exam: BP 133/72   Pulse 76   Temp 97.8 F (36.6 C) (Oral)   Resp 19   Ht 5' 2"  (1.575 m)   Wt 129.5 kg   LMP 07/11/2007 (Approximate)   SpO2 100%   BMI 52.22 kg/m   General: 66 y.o. year-old obese female well developed well nourished in no acute distress.  Alert and oriented x3. HEENT: NCAT, EOMI Neck: Supple, trachea medial Cardiovascular: Regular rate and rhythm with no rubs or gallops.  No thyromegaly or JVD noted.  No lower extremity edema. 2/4 pulses in all 4 extremities. Respiratory: Clear to auscultation with no wheezes or rales. Good inspiratory effort. Abdomen: Soft, nontender nondistended with normal bowel sounds x4 quadrants. Muskuloskeletal: No cyanosis, clubbing or edema noted bilaterally Neuro: CN II-XII intact, strength 5/5 x 4, sensation, reflexes intact Skin: No ulcerative lesions noted or rashes Psychiatry: Mood is appropriate for condition and setting          Labs on Admission:  Basic Metabolic Panel: Recent Labs  Lab 12/06/21 1321 12/07/21 0458  NA 135 137  K 4.0 3.9   CL 101 105  CO2 30 29  GLUCOSE 106* 116*  BUN 11 15  CREATININE 0.62 0.60  CALCIUM 8.2* 8.5*   Liver Function Tests: Recent Labs  Lab 12/06/21 1551 12/07/21 0458  AST 58* 51*  ALT 36 32  ALKPHOS 80 74  BILITOT 1.2 1.1  PROT 8.5* 8.0  ALBUMIN 1.9* 1.8*   No results for input(s): LIPASE, AMYLASE in the last 168 hours. Recent Labs  Lab 12/06/21 1551  AMMONIA 42*   CBC: Recent Labs  Lab 12/06/21 1321 12/07/21 0458  WBC 9.0 8.1  HGB 14.0 13.5  HCT 42.5 40.6  MCV 101.0* 101.5*  PLT 138* 118*   Cardiac Enzymes: No results for input(s): CKTOTAL, CKMB, CKMBINDEX, TROPONINI in the last 168 hours.  BNP (last 3 results) Recent Labs    09/20/21 0101  BNP 46.0    ProBNP (last 3 results) No results for input(s): PROBNP in the last 8760 hours.  CBG: Recent Labs  Lab 12/06/21 2210  GLUCAP 74    Radiological Exams on Admission: CT Head Wo Contrast  Result Date: 12/06/2021 CLINICAL DATA:  Altered mental status, nontraumatic (Ped 0-17y) EXAM: CT HEAD WITHOUT CONTRAST TECHNIQUE: Contiguous axial images were obtained from the base of the skull through the vertex without intravenous contrast. RADIATION DOSE REDUCTION: This exam was performed according to the departmental dose-optimization program which includes automated exposure control, adjustment of the mA and/or kV according to patient size and/or use of iterative reconstruction technique. COMPARISON:  MRI September 10, 2019. FINDINGS: Brain: No evidence of acute infarction, hemorrhage, hydrocephalus, extra-axial collection or mass  lesion/mass effect. Mild patchy white matter hypodensities, nonspecific but compatible with chronic microvascular ischemic disease. Vascular: No hyperdense vessel identified. Skull: No acute fracture Sinuses/Orbits: Clear sinuses.  No acute orbital findings. Other: No mastoid effusions. IMPRESSION: No evidence of acute intracranial abnormality. Electronically Signed   By: Margaretha Sheffield M.D.   On:  12/06/2021 15:44    EKG: I independently viewed the EKG done and my findings are as followed: Normal sinus rhythm with PACs at a rate of 93 bpm  Assessment/Plan Present on Admission:  Altered mental status  Sleep apnea  GERD  Essential hypertension  Principal Problem:   Altered mental status Active Problems:   Essential hypertension   GERD   Sleep apnea   Morbid obesity with BMI of 50.0-59.9, adult (HCC)   Seizure (HCC)   Generalized weakness   Hypoalbuminemia due to protein-calorie malnutrition (HCC)   Hyperammonemia (HCC)   UTI (urinary tract infection)   Elevated MCV   Essential tremor   Psoriatic arthritis (HCC)   Asthma, chronic  Altered mental status possibly due to multifactorial rule out CVA CT head without contrast showed no evidence of acute intracranial abnormality Patient will be admitted to telemetry unit  Bilateral carotid ultrasound in the morning Echocardiogram in the morning MRI of brain without contrast in the morning Continue aspirin and statin Continue fall precautions and neuro checks Lipid panel and hemoglobin A1c will be checked Continue PT/OT eval and treat Bedside swallow eval by nursing prior to diet Telemetry neurology was consulted in the ED and recommended EEG and further stroke work-up Telemetry neurology will be consulted and we shall await further recommendations.  Presumed seizure Patient has followed with Dr. Merlene Laughter as an outpatient and with plan to do an EEG at a later date EEG will be done in the morning  Presumed UTI POA Patient complained of increased urinary frequency, but denies burning sensation on urination or any other irritative bladder symptoms Patient was started on IV Rocephin, we shall continue same Urine culture pending  Hyperammonemia in the setting of history of cirrhosis due to NASH Ammonia level 42 Lactulose will be given Continue to monitor ammonia level  Elevated MCV (101.0) Patient complained of  recently impaired memory and gait problems Folate level and vitamin B12 levels will be checked  Generalized weakness Hypothyroidism Patient complained of increased weakness, sleepiness, weight gain, cold sensation in warm weather. Synthroid dose was recently reduced TSH will be checked  Hypoalbuminemia possibly secondary to severe protein calorie malnutrition Protein supplement will be provided Dietitian will be consulted and we shall await further recommendations  Thrombocytopenia possibly reactive Platelets 138, continue to monitor platelet levels  Essential tremors Stable  Morbid obesity (BMI 52.22) Diet and lifestyle modification  Essential hypertension Continue Benicar  Mixed hyperlipidemia Continue Crestor  GERD Continue Protonix  OSA on CPAP Continue CPAP  Asthma not in acute exacerbation Continue Ventolin and Dulera  Psoriatic arthritis Stable  DVT prophylaxis: SCDs  Code Status: Full code  Consults: Teleneurology  Family Communication: Friend at bedside (all questions answered to satisfaction)  Severity of Illness: The appropriate patient status for this patient is OBSERVATION. Observation status is judged to be reasonable and necessary in order to provide the required intensity of service to ensure the patient's safety. The patient's presenting symptoms, physical exam findings, and initial radiographic and laboratory data in the context of their medical condition is felt to place them at decreased risk for further clinical deterioration. Furthermore, it is anticipated that the patient will be  medically stable for discharge from the hospital within 2 midnights of admission.   Author: Bernadette Hoit, DO 12/07/2021 7:10 AM  For on call review [email or web address].

## 2021-12-06 NOTE — ED Provider Notes (Signed)
[facility] EMERGENCY DEPARTMENT Provider Note   CSN: [id number] Arrival date & time: 12/06/21  1158     History  Chief Complaint  Patient presents with   Weakness    Yolanda [name] is a 66 y.o. female who presents emergency department for evaluation of confusion.  She has a past medical history including obesity, sleep apnea, hypoxic respiratory failure, hypothyroidism, cirrhosis.  History is given by the patient's daughter and family member at bedside.  Patient was admitted at the end of March with pneumonia.  Daughter reports that since she was discharged she has had multiple mental changes including an acute change in her personality, forgetfulness and confusion.  Patient has also had episodes where she is speaking and then begins to speak in [place].  She has been seen by outpatient neurology and thought she might be having seizure-like symptoms they recommended EEG.  Patient also had an episode of shaking in her sleep lasting about 30 seconds.  She was unable to be aroused for about 5 minutes afterward and was confused significantly after that.  She does not have a history of seizure.  Patient daughter gives samples of confusion including patient you trying to drive and then not looking at oncoming traffic, driving home and then stopping the car and taking her keys out without parking it she has also left the gas on her stove running.  Daughter reports she is sleeping up to 21 hours a day.  She is having exist significant exertional dyspnea.   Weakness     Home Medications Prior to Admission medications   Medication Sig Start Date End Date Taking? Authorizing Provider  albuterol (VENTOLIN HFA) 108 (90 Base) MCG/ACT inhaler Inhale 1-2 puffs into the lungs every 6 (six) hours as needed for wheezing or shortness of breath. 11/08/19  Yes Long, Wonda Olds, MD  bisacodyl (BISACODYL) 5 MG EC tablet Take 10 mg by mouth daily as needed.   Yes [provider]  budesonide-formoterol  (SYMBICORT) 80-4.5 MCG/ACT inhaler Take 2 puffs first thing in am and then another 2 puffs about 12 hours later. 11/25/21  Yes Tanda Rockers, MD  Cholecalciferol (VITAMIN D3) 2000 units TABS Take 2,000 Units by mouth daily.    Yes [provider]  CRANBERRY PO Take 500 mg by mouth daily.   Yes [provider]  cyclobenzaprine (FLEXERIL) 10 MG tablet Take 10 mg by mouth daily.   Yes [provider]  DULoxetine (CYMBALTA) 60 MG capsule Take 60 mg by mouth daily. 07/15/21  Yes [provider]  inFLIXimab (REMICADE IV) Inject into the vein. As directed   Yes [provider]  Levothyroxine Sodium 100 MCG CAPS Take 75 mcg by mouth daily before breakfast.   Yes [provider]  meclizine (ANTIVERT) 25 MG tablet Take 25 mg by mouth 3 (three) times daily as needed for dizziness.   Yes [provider]  Melatonin 10 MG CAPS Take 10 mg by mouth at bedtime as needed (sleep).   Yes [provider]  metFORMIN (GLUCOPHAGE-XR) 500 MG 24 hr tablet 1 po with supper x 1 week, then 1 po BID WC x 1 week, then 2 po BID WC Patient taking differently: Take 500 mg by mouth 2 (two) times daily. 09/22/21  Yes Johnson, Clanford L, MD  olmesartan-hydrochlorothiazide (BENICAR HCT) 40-25 MG tablet Take 1 tablet by mouth daily. 02/20/17  Yes [provider]  omeprazole (PRILOSEC) 20 MG capsule Take 1 capsule (20 mg total) by mouth  2 (two) times daily before a meal. 1 po 30 mins prior to [place]. Patient taking differently: Take 20 mg by mouth 2 (two) times daily as needed (gerd). 03/09/21 03/09/22 Yes Carver, Elon Alas, DO  rosuvastatin (CRESTOR) 10 MG tablet Take 10 mg by mouth daily.   Yes [provider]  sucralfate (CARAFATE) 1 g tablet Take 1 g by mouth 4 (four) times daily.   Yes [provider]  traMADol (ULTRAM) 50 MG tablet 1 to 2 every 4 hours as needed for cough 11/25/21  Yes Wert, Christena Deem, MD  blood glucose meter kit and  supplies Dispense based on patient and insurance preference. Use up to four times daily as directed. (FOR ICD-10 E10.9, E11.9). 09/22/21   Johnson, Clanford L, MD  Budeson-Glycopyrrol-Formoterol (BREZTRI AEROSPHERE) 160-9-4.8 MCG/ACT AERO Inhale 2 puffs into the lungs in the morning and at bedtime. Patient not taking: Reported on 12/06/2021 11/25/21   Tanda Rockers, MD      Allergies    Azithromycin, Lisinopril, and Sulfonamide derivatives    Review of Systems   Review of Systems  Neurological:  Positive for weakness.   Physical Exam Updated Vital Signs BP 135/67   Pulse 80   Temp 98.1 F (36.7 C) (Oral)   Resp 20   Ht 5' 2" (1.575 m)   Wt 129.5 kg   LMP 07/11/2007 (Approximate)   SpO2 94%   BMI 52.22 kg/m  Physical Exam Vitals and nursing note reviewed.  Constitutional:      General: She is not in acute distress.    Appearance: She is well-developed. She is not diaphoretic.  HENT:     Head: Normocephalic and atraumatic.     Right Ear: External ear normal.     Left Ear: External ear normal.     Nose: Nose normal.     Mouth/Throat:     Mouth: Mucous membranes are moist.  Eyes:     General: No scleral icterus.    Conjunctiva/sclera: Conjunctivae normal.  Cardiovascular:     Rate and Rhythm: Normal rate and regular rhythm.     Heart sounds: Normal heart sounds. No murmur heard.   No friction rub. No gallop.  Pulmonary:     Effort: Pulmonary effort is normal. No respiratory distress.     Breath sounds: Normal breath sounds.  Abdominal:     General: Bowel sounds are normal. There is no distension.     Palpations: Abdomen is soft. There is no mass.     Tenderness: There is no abdominal tenderness. There is no guarding.  Musculoskeletal:     Cervical back: Normal range of motion.  Skin:    General: Skin is warm and dry.  Neurological:     Mental Status: She is alert.     Comments: Patient speech is clear and goral oriented.  She seems to have difficulty following  two-step commands.  She has normal finger-nose on the right however when asked to perform finger-to-nose on the left she touches her to index fingers together.  Patient has to be redirected multiple times.  Remainder of her neurologic exam seems to be within normal limits  Psychiatric:        Behavior: Behavior normal.    ED Results / Procedures / Treatments   Labs (all labs ordered are listed, but only abnormal results are displayed) Labs Reviewed  BASIC METABOLIC PANEL - Abnormal; Notable for the following components:      Result Value   Glucose,  Bld 106 (*)    Calcium 8.2 (*)    Anion gap 4 (*)    All other components within normal limits  CBC - Abnormal; Notable for the following components:   MCV 101.0 (*)    Platelets 138 (*)    All other components within normal limits  URINALYSIS, ROUTINE W REFLEX MICROSCOPIC - Abnormal; Notable for the following components:   APPearance HAZY (*)    Hgb urine dipstick SMALL (*)    Leukocytes,Ua MODERATE (*)    WBC, UA >50 (*)    Bacteria, UA MANY (*)    All other components within normal limits  HEPATIC FUNCTION PANEL - Abnormal; Notable for the following components:   Total Protein 8.5 (*)    Albumin 1.9 (*)    AST 58 (*)    Bilirubin, Direct 0.3 (*)    All other components within normal limits  AMMONIA - Abnormal; Notable for the following components:   Ammonia 42 (*)    All other components within normal limits  BLOOD GAS, VENOUS - Abnormal; Notable for the following components:   pO2, Ven <31 (*)    Bicarbonate 33.3 (*)    Acid-Base Excess 6.6 (*)    All other components within normal limits  URINE CULTURE  RAPID URINE DRUG SCREEN, HOSP PERFORMED  ETHANOL  GLUCOSE, CAPILLARY  COMPREHENSIVE METABOLIC PANEL  CBC  LIPID PANEL  HEMOGLOBIN A1C  TSH  FOLATE  VITAMIN B12    EKG EKG Interpretation  Date/Time:  Tuesday Dec 06 2021 12:19:56 EDT Ventricular Rate:  93 PR Interval:  160 QRS Duration: 72 QT  Interval:  352 QTC Calculation: 437 R Axis:   16 Text Interpretation: Sinus rhythm with Premature atrial complexes Low voltage QRS Borderline ECG When compared with ECG of 20-Sep-2021 01:09, PREVIOUS ECG IS PRESENT Confirmed by Noemi Chapel [phone]) on 12/06/2021 2:08:09 PM  Radiology CT Head Wo Contrast  Result Date: 12/06/2021 CLINICAL DATA:  Altered mental status, nontraumatic (Ped 0-17y) EXAM: CT HEAD WITHOUT CONTRAST TECHNIQUE: Contiguous axial images were obtained from the base of the skull through the vertex without intravenous contrast. RADIATION DOSE REDUCTION: This exam was performed according to the departmental dose-optimization program which includes automated exposure control, adjustment of the mA and/or kV according to patient size and/or use of iterative reconstruction technique. COMPARISON:  MRI September 10, 2019. FINDINGS: Brain: No evidence of acute infarction, hemorrhage, hydrocephalus, extra-axial collection or mass lesion/mass effect. Mild patchy white matter hypodensities, nonspecific but compatible with chronic microvascular ischemic disease. Vascular: No hyperdense vessel identified. Skull: No acute fracture Sinuses/Orbits: Clear sinuses.  No acute orbital findings. Other: No mastoid effusions. IMPRESSION: No evidence of acute intracranial abnormality. Electronically Signed   By: Margaretha Sheffield M.D.   On: 12/06/2021 15:44    Procedures Procedures    Medications Ordered in ED Medications  acetaminophen (TYLENOL) tablet 650 mg (has no administration in time range)    Or  acetaminophen (TYLENOL) suppository 650 mg (has no administration in time range)  aspirin EC tablet 81 mg (has no administration in time range)  lactulose (CHRONULAC) 10 GM/15ML solution 20 g (20 g Oral Given 12/06/21 2244)  feeding supplement (GLUCERNA SHAKE) (GLUCERNA SHAKE) liquid 237 mL (has no administration in time range)  cefTRIAXone (ROCEPHIN) 1 g in sodium chloride 0.9 % 100 mL IVPB (has no  administration in time range)  fluconazole (DIFLUCAN) tablet 150 mg (150 mg Oral Given 12/06/21 2050)  cefTRIAXone (ROCEPHIN) 1 g in sodium chloride 0.9 % 100  mL IVPB (1 g Intravenous New Bag/Given 12/06/21 2248)    ED Course/ Medical Decision Making/ A&P Clinical Course as of 12/07/21 0021  Tue Dec 06, 2021  1617 Blood gas, venous (at [facility] and [facility], not at [facility])(!!) [AH]    Clinical Course User Index [AH] Margarita Mail, PA-C                           Medical Decision Making Patient here with altered mental status. The differential diagnosis for AMS is extensive and includes, but is not limited to: drug overdose - opioids, alcohol, sedatives, antipsychotics, drug withdrawal, others; Metabolic: hypoxia, hypoglycemia, hyperglycemia, hypercalcemia, hypernatremia, hyponatremia, uremia, hepatic encephalopathy, hypothyroidism, hyperthyroidism, vitamin B12 or thiamine deficiency, carbon monoxide poisoning, Wilson's disease, Lactic acidosis,DKA/HHOS; Infectious: meningitis, encephalitis, bacteremia/sepsis, urinary tract infection, pneumonia, neurosyphilis; Structural: Space-occupying lesion, (brain tumor, subdural hematoma, hydrocephalus,); Vascular: stroke, subarachnoid hemorrhage, coronary ischemia, hypertensive encephalopathy, CNS vasculitis, thrombotic thrombocytopenic purpura, disseminated intravascular coagulation, hyperviscosity; Psychiatric: Schizophrenia, depression; Other: Seizure, hypothermia, heat stroke, ICU psychosis, dementia -"sundowning." After review of all data points patient appears to have a urinary tract infection.  VBG, CBC ammonia level are rather unremarkable.  Her CT scan of the head shows no acute findings.  Given her symptoms patient may have encephalopathy however potential for something like a frontal lobe stroke with her inability to follow two-step commands or to execute a series of commands along with personality change and potentially seizure-like activity could indicate  stroke.  Case discussed with Dr. Josephine Cables who will admit the patient.  Case also discussed with Dr. Curly Shores who recommends MRI, EEG and telemetry neuro follow-up tomorrow.   Amount and/or Complexity of Data Reviewed Labs: ordered. Decision-making details documented in ED Course. Radiology: ordered and independent interpretation performed.    Details: CT head I visualized and interpreted with no acute findings ECG/medicine tests:     Details: EKG shows sinus rhythm at a rate of 93 Discussion of management or test interpretation with external provider(s): Dr. Josephine Cables and Dr. Curly Shores  Risk Prescription drug management. Decision regarding hospitalization.           Final Clinical Impression(s) / ED Diagnoses Final diagnoses:  Confusion  Altered mental status, unspecified altered mental status type  Acute cystitis with hematuria    Rx / DC Orders ED Discharge Orders          Ordered    cefTRIAXone (ROCEPHIN) 1 g injection   Once        12/06/21 1921              Margarita Mail, PA-C 01/18/18 7588    Lianne Cure, DO 32/54/98 2323

## 2021-12-06 NOTE — ED Triage Notes (Signed)
Per daughter, every since pt was discharge from hospital for pneumonia several months ago pt has been declining, sleeping more, forgetful, behavior changes.

## 2021-12-07 ENCOUNTER — Observation Stay (HOSPITAL_BASED_OUTPATIENT_CLINIC_OR_DEPARTMENT_OTHER): Payer: Medicare Other

## 2021-12-07 ENCOUNTER — Observation Stay (HOSPITAL_COMMUNITY): Payer: Medicare Other

## 2021-12-07 DIAGNOSIS — I10 Essential (primary) hypertension: Secondary | ICD-10-CM | POA: Diagnosis not present

## 2021-12-07 DIAGNOSIS — R531 Weakness: Secondary | ICD-10-CM

## 2021-12-07 DIAGNOSIS — E8809 Other disorders of plasma-protein metabolism, not elsewhere classified: Secondary | ICD-10-CM

## 2021-12-07 DIAGNOSIS — J45909 Unspecified asthma, uncomplicated: Secondary | ICD-10-CM

## 2021-12-07 DIAGNOSIS — L4050 Arthropathic psoriasis, unspecified: Secondary | ICD-10-CM

## 2021-12-07 DIAGNOSIS — K7682 Hepatic encephalopathy: Secondary | ICD-10-CM | POA: Diagnosis not present

## 2021-12-07 DIAGNOSIS — G250 Essential tremor: Secondary | ICD-10-CM

## 2021-12-07 DIAGNOSIS — R569 Unspecified convulsions: Secondary | ICD-10-CM

## 2021-12-07 DIAGNOSIS — E7220 Disorder of urea cycle metabolism, unspecified: Secondary | ICD-10-CM | POA: Diagnosis present

## 2021-12-07 DIAGNOSIS — N390 Urinary tract infection, site not specified: Secondary | ICD-10-CM

## 2021-12-07 DIAGNOSIS — R4182 Altered mental status, unspecified: Secondary | ICD-10-CM | POA: Diagnosis not present

## 2021-12-07 DIAGNOSIS — R718 Other abnormality of red blood cells: Secondary | ICD-10-CM

## 2021-12-07 LAB — COMPREHENSIVE METABOLIC PANEL
ALT: 32 U/L (ref 0–44)
AST: 51 U/L — ABNORMAL HIGH (ref 15–41)
Albumin: 1.8 g/dL — ABNORMAL LOW (ref 3.5–5.0)
Alkaline Phosphatase: 74 U/L (ref 38–126)
Anion gap: 3 — ABNORMAL LOW (ref 5–15)
BUN: 15 mg/dL (ref 8–23)
CO2: 29 mmol/L (ref 22–32)
Calcium: 8.5 mg/dL — ABNORMAL LOW (ref 8.9–10.3)
Chloride: 105 mmol/L (ref 98–111)
Creatinine, Ser: 0.6 mg/dL (ref 0.44–1.00)
GFR, Estimated: >60 mL/min (ref >60)
Glucose, Bld: 116 mg/dL — ABNORMAL HIGH (ref 70–99)
Potassium: 3.9 mmol/L (ref 3.5–5.1)
Sodium: 137 mmol/L (ref 135–145)
Total Bilirubin: 1.1 mg/dL (ref 0.3–1.2)
Total Protein: 8 g/dL (ref 6.5–8.1)

## 2021-12-07 LAB — ECHOCARDIOGRAM COMPLETE
AR max vel: 2.07 cm2
AV Area VTI: 2.11 cm2
AV Area mean vel: 1.92 cm2
AV Mean grad: 8 mmHg
AV Peak grad: 13.5 mmHg
Ao pk vel: 1.84 m/s
Area-P 1/2: 2.42 cm2
Height: 62 in
MV VTI: 2.71 cm2
S' Lateral: 2.3 cm
Weight: 4567.93 oz

## 2021-12-07 LAB — HEMOGLOBIN A1C
Hgb A1c MFr Bld: 5.4 % (ref 4.8–5.6)
Mean Plasma Glucose: 108.28 mg/dL

## 2021-12-07 LAB — CBC
HCT: 40.6 % (ref 36.0–46.0)
Hemoglobin: 13.5 g/dL (ref 12.0–15.0)
MCH: 33.8 pg (ref 26.0–34.0)
MCHC: 33.3 g/dL (ref 30.0–36.0)
MCV: 101.5 fL — ABNORMAL HIGH (ref 80.0–100.0)
Platelets: 118 10*3/uL — ABNORMAL LOW (ref 150–400)
RBC: 4 MIL/uL (ref 3.87–5.11)
RDW: 13.7 % (ref 11.5–15.5)
WBC: 8.1 10*3/uL (ref 4.0–10.5)
nRBC: 0 % (ref 0.0–0.2)

## 2021-12-07 LAB — LIPID PANEL
Cholesterol: 130 mg/dL (ref 0–200)
HDL: 29 mg/dL — ABNORMAL LOW (ref >40)
LDL Cholesterol: 92 mg/dL (ref 0–99)
Total CHOL/HDL Ratio: 4.5 RATIO
Triglycerides: 47 mg/dL (ref <150)
VLDL: 9 mg/dL (ref 0–40)

## 2021-12-07 LAB — GLUCOSE, CAPILLARY: Glucose-Capillary: 132 mg/dL — ABNORMAL HIGH (ref 70–99)

## 2021-12-07 LAB — FOLATE: Folate: 13.8 ng/mL (ref >5.9)

## 2021-12-07 LAB — TSH: TSH: 1.509 u[IU]/mL (ref 0.350–4.500)

## 2021-12-07 LAB — VITAMIN B12: Vitamin B-12: 447 pg/mL (ref 180–914)

## 2021-12-07 MED ORDER — OLMESARTAN MEDOXOMIL-HCTZ 40-12.5 MG PO TABS: 1.0000 | ORAL_TABLET | Freq: Every day | ORAL | 3 refills | Status: DC

## 2021-12-07 MED ORDER — LACTULOSE ENCEPHALOPATHY 10 GM/15ML PO SOLN: 20.0000 g | Freq: Every day | ORAL | 1 refills | Status: DC

## 2021-12-07 MED ORDER — OLMESARTAN MEDOXOMIL-HCTZ 40-25 MG PO TABS: 1.0000 | ORAL_TABLET | Freq: Every day | ORAL | Status: DC

## 2021-12-07 MED ORDER — HYDROCHLOROTHIAZIDE 25 MG PO TABS
25.0000 mg | ORAL_TABLET | Freq: Every day | ORAL | Status: DC
  Administered 2021-12-07: 25 mg via ORAL
  Filled 2021-12-07: qty 1

## 2021-12-07 MED ORDER — LEVOTHYROXINE SODIUM 75 MCG PO TABS
75.0000 ug | ORAL_TABLET | Freq: Every day | ORAL | Status: DC
  Administered 2021-12-07: 75 ug via ORAL
  Filled 2021-12-07: qty 1

## 2021-12-07 MED ORDER — IRBESARTAN 150 MG PO TABS
300.0000 mg | ORAL_TABLET | Freq: Every day | ORAL | Status: DC
  Administered 2021-12-07: 300 mg via ORAL
  Filled 2021-12-07: qty 2

## 2021-12-07 MED ORDER — PERFLUTREN LIPID MICROSPHERE
1.0000 mL | INTRAVENOUS | Status: AC | PRN
  Administered 2021-12-07: 5 mL via INTRAVENOUS

## 2021-12-07 MED ORDER — CEPHALEXIN 500 MG PO CAPS
500.0000 mg | ORAL_CAPSULE | Freq: Three times a day (TID) | ORAL | 0 refills | Status: AC
Start: 2021-12-08 — End: 2021-12-11

## 2021-12-07 MED ORDER — ALBUTEROL SULFATE (2.5 MG/3ML) 0.083% IN NEBU: 3.0000 mL | INHALATION_SOLUTION | Freq: Four times a day (QID) | RESPIRATORY_TRACT | Status: DC | PRN

## 2021-12-07 MED ORDER — MOMETASONE FURO-FORMOTEROL FUM 100-5 MCG/ACT IN AERO
2.0000 | INHALATION_SPRAY | Freq: Two times a day (BID) | RESPIRATORY_TRACT | Status: DC
  Administered 2021-12-07: 2 via RESPIRATORY_TRACT
  Filled 2021-12-07: qty 8.8

## 2021-12-07 MED ORDER — ROSUVASTATIN CALCIUM 10 MG PO TABS: 10.0000 mg | ORAL_TABLET | Freq: Every day | ORAL | Status: DC

## 2021-12-07 MED ORDER — FLUCONAZOLE 150 MG PO TABS: 150.0000 mg | ORAL_TABLET | Freq: Once | ORAL | 0 refills | Status: AC

## 2021-12-07 MED ORDER — PANTOPRAZOLE SODIUM 40 MG PO TBEC
40.0000 mg | DELAYED_RELEASE_TABLET | Freq: Every day | ORAL | Status: DC
  Administered 2021-12-07: 40 mg via ORAL
  Filled 2021-12-07: qty 1

## 2021-12-07 NOTE — Evaluation (Addendum)
Physical Therapy Evaluation Patient Details Name: Yolanda [name] MRN: [id number] DOB: November 05, 1955 Today's Date: 12/07/2021  History of Present Illness  Yolanda [name] is a 66 y.o. female with medical history significant of hepatic cirrhosis, psoriatic arthritis, rheumatoid arthritis, morbid obesity, GERD, OSA on CPAP (noncompliant), hypertension, hyperlipidemia, type 2 diabetes mellitus, hypothyroidism, asthma who presents to the emergency department for evaluation of change in mental status.  Patient was concerned of possible stroke, she states that when she is talking, sometimes her speech turns to gibberish, this has happened intermittently over the course of 1 year.  She also complained of her daughter suspecting her of having seizures, since she was noted 1 day to be shaking while asleep, it took few minutes to be able to wake her up and she was confused for about 5 minutes prior to returning to her baseline of functioning, she has been to neurologist (Dr. Merlene Laughter) who plans to do an EEG on her within the next 1-1/2 weeks.  She complained of excessive sleeping (up to 20 hours daily), increased weakness and difficulty in rising up from bed.  She endorsed more than 20 pound weight gain within the last year.  Her new PCP recently reduced (about a week ago) her levothyroxine dosage.  Friend at bedside states that patient has also been having some memory problems whereby she turned on a stove 1 day and just walked away, and sometimes she opens her refrigerator and forget what she wanted to get out of the refrigerator and also forget to close her refrigerator.  Patient states that while driving yesterday, she looked to the right prior to making a turn, but instead of turning to the right, she turned to the left.  She realized that this may be dangerous, so she decided not to drive again until she is able to resolve her memory issues.  Patient also states that when she walks, it appears as if she was  having high-stepping gait.  She denies chest pain, headache, fever, chills, nausea, vomiting, abdominal pain, constipation or diarrhea.  Patient was recently admitted from 3/14 through 3/16 due to pulmonary alveolar hemorrhage during which pulmonology was consulted due to having hypoxia on ambulation.  She was also diagnosed to have multifocal pneumonia which was treated with IV ceftriaxone and doxycycline.   Clinical Impression  Patient modified independent with bed mobility with slight labored movement observed along with need of extended time to complete task. Patient min guard/assist with transfers and use of cane with balance deficits observed. Patient demonstrates generalized weakness of LE's along with decreased coordination impacting ability to transfer independently. Patient was able to ambulate for 60 feet with min guard and supervision provided by PT. Patient was able to ambulate for 30 feet with straight cane but was very unsteady. Patient trial RW for remaining 30 feet and was able to ambulate faster with increased stability and coordination. Patient demonstrated gait deficits of B LE ER and decreased ankle DF B which impacted patient's stance time primarily on the R LE and affecting overall gait pattern. Patient also has difficulty with weight shifting and muscle coordination with difficulty "placing my legs where my mind wants them to go.  Patient to be discharged home today and patient discharged to care of nursing for ambulation daily as tolerated for length of stay.      Recommendations for follow up therapy are one component of a multi-disciplinary discharge planning process, led by the attending physician.  Recommendations may be updated based on patient  status, additional functional criteria and insurance authorization.  Follow Up Recommendations Outpatient PT    Assistance Recommended at Discharge PRN  Patient can return home with the following  A little help with walking and/or  transfers;Help with stairs or ramp for entrance    Equipment Recommendations Rolling walker (2 wheels)  Recommendations for Other Services       Functional Status Assessment Patient has had a recent decline in their functional status and demonstrates the ability to make significant improvements in function in a reasonable and predictable amount of time.     Precautions / Restrictions Precautions Precautions: Fall Restrictions Weight Bearing Restrictions: No      Mobility  Bed Mobility Overal bed mobility: Modified Independent             General bed mobility comments: Patient demonstrated mild labored movement with supine to sit bed mobility with addition of requring extended time to complete task.    Transfers Overall transfer level: Needs assistance Equipment used: Straight cane, Rolling walker (2 wheels) Transfers: Sit to/from Stand, Bed to chair/wheelchair/BSC Sit to Stand: Min guard, Min assist   Step pivot transfers: Min guard, Min assist       General transfer comment: Patient is min guard/assist with sit to stand and bed to chair transfers with cane. Patient struggled with LE's and locking out body during STS with need of min assist of PT to complete task due to generalized weakness of LE. Patient is unsteady with use of cane during bed to chair transfer with mild balance deficits observed.    Ambulation/Gait Ambulation/Gait assistance: Min guard, Supervision Gait Distance (Feet): 60 Feet Assistive device: Straight cane, Rolling walker (2 wheels) Gait Pattern/deviations: Decreased step length - right, Decreased step length - left, Decreased stride length, Decreased weight shift to left, Decreased weight shift to right, Trunk flexed, Decreased stance time - right Gait velocity: decreased     General Gait Details: Patient ambulated 30 feet with straight cane and min guard from PT. Halfway back, patient trialed RW in which gait speed and balance improved  allowing patient to ambulate more safely. Patient did present with gait deficits of B LE ER along with decreased ankle DF B which decreased patient's decreased stance time primarily on R LE. Patient does have difficulty with weight shifting and muscle coordination with expressing difficulty of "making my legs go where my mind wants them to."  Stairs            Wheelchair Mobility    Modified Rankin (Stroke Patients Only)       Balance Overall balance assessment: Needs assistance Sitting-balance support: No upper extremity supported, Feet supported Sitting balance-Leahy Scale: Good Sitting balance - Comments: Patient able to sit EOB with UE support   Standing balance support: Bilateral upper extremity supported, During functional activity, Reliant on assistive device for balance Standing balance-Leahy Scale: Fair Standing balance comment: Patient used cane and RW for balance demonstrating reliance on AD. Patient is unsteady in general with decreased balancing ability along with weight shfting difficulty.                             Pertinent Vitals/Pain Pain Assessment Pain Assessment: No/denies pain    Home Living Family/patient expects to be discharged to:: Private residence Living Arrangements: Children Available Help at Discharge: Family;Available 24 hours/day Type of Home: House Home Access: Stairs to enter Entrance Stairs-Rails: None Entrance Stairs-Number of Steps: 2 Alternate Level Stairs-Number  of Steps: 12 to 15 Home Layout: Two level;Laundry or work area in basement;Able to live on main level with bedroom/bathroom Home Equipment: Albers (4 wheels);Cane - single point;Shower seat;Grab bars - tub/shower Additional Comments: Pt reported no interest in having a BSC at home.    Prior Function Prior Level of Function : Needs assist       Physical Assist : Mobility (physical);ADLs (physical) Mobility (physical): Gait;Transfers ADLs (physical):  IADLs;Dressing Mobility Comments: Pt reports household ambulation with SPC and community ambulation with rollator. Pt reported sometimes her grandchildren will walk in front and behind her if she is having trouble with her ambulation. Uses scooter at grocery store. ADLs Comments: Assisted to don socks from daughter but typically does not wear them. Assisted for cooking and cleaning. Has cleaner every other week.     Hand Dominance   Dominant Hand: Right    Extremity/Trunk Assessment   Upper Extremity Assessment Upper Extremity Assessment: Defer to OT evaluation    Lower Extremity Assessment Lower Extremity Assessment: Generalized weakness    Cervical / Trunk Assessment Cervical / Trunk Assessment: Normal  Communication   Communication: No difficulties  Cognition Arousal/Alertness: Awake/alert Behavior During Therapy: WFL for tasks assessed/performed Overall Cognitive Status: Within Functional Limits for tasks assessed                                          General Comments      Exercises     Assessment/Plan    PT Assessment Patient needs continued PT services;All further PT needs can be met in the next venue of care  PT Problem List Decreased strength;Decreased range of motion;Decreased balance;Decreased activity tolerance;Decreased mobility;Decreased coordination       PT Treatment Interventions DME instruction;Gait training;Functional mobility training;Therapeutic activities;Therapeutic exercise;Balance training    PT Goals (Current goals can be found in the Care Plan section)  Acute Rehab PT Goals Patient Stated Goal: return home PT Goal Formulation: With patient Time For Goal Achievement: 12/21/21 Potential to Achieve Goals: Good    Frequency Min 3X/week     Co-evaluation PT/OT/SLP Co-Evaluation/Treatment: Yes Reason for Co-Treatment: To address functional/ADL transfers PT goals addressed during session: Mobility/safety with  mobility;Balance;Proper use of DME;Strengthening/ROM         AM-PAC PT "6 Clicks" Mobility  Outcome Measure Help needed turning from your back to your side while in a flat bed without using bedrails?: None Help needed moving from lying on your back to sitting on the side of a flat bed without using bedrails?: A Little Help needed moving to and from a bed to a chair (including a wheelchair)?: A Little Help needed standing up from a chair using your arms (e.g., wheelchair or bedside chair)?: A Little Help needed to walk in hospital room?: A Little Help needed climbing 3-5 steps with a railing? : A Lot 6 Click Score: 18    End of Session Equipment Utilized During Treatment: Gait belt Activity Tolerance: Patient tolerated treatment well;No increased pain;Patient limited by fatigue Patient left: in chair;with call bell/phone within reach Nurse Communication: Mobility status PT Visit Diagnosis: Unsteadiness on feet (R26.81);Other abnormalities of gait and mobility (R26.89);Muscle weakness (generalized) (M62.81)    Time: 8295-6213 PT Time Calculation (min) (ACUTE ONLY): 28 min   Charges:   PT Evaluation $PT Eval Moderate Complexity: 1 Mod PT Treatments $Therapeutic Activity: 23-37 mins        2:07  PM, 12/07/21 Lestine Box, S/PT   During this treatment session, the therapist was present, participating in and directing the treatment.  2:26 PM, 12/07/21 Lonell Grandchild, MPT Physical Therapist with [facility] [phone] office [phone] mobile phone

## 2021-12-07 NOTE — Progress Notes (Signed)
*  PRELIMINARY RESULTS* Echocardiogram 2D Echocardiogram has been performed.  Yolanda [name] 12/07/2021, 1:01 PM

## 2021-12-07 NOTE — Discharge Instructions (Signed)
1)Please follow-up with Neurologist Dr. Phillips Odor-- Phone: [phone], Address: [address], [place], [place] [address] in 4 to 6 days for recheck and reevaluation and EEG Test.  Please call to make appointment with him - 2)Take Lactulose once daily with goal of 2 to 3 semi-solid stools per day..., Avoid Constipation  3)Reduce HCTZ/Hydrochlorthiazide to 12.5 mg q am---  4) please use your CPAP nightly as advised

## 2021-12-07 NOTE — Evaluation (Signed)
Occupational Therapy Evaluation Patient Details Name: Yolanda [name] MRN: [id number] DOB: Dec 20, 1955 Today's Date: 12/07/2021   History of Present Illness Yolanda [name] is a 66 y.o. female with medical history significant of hepatic cirrhosis, psoriatic arthritis, rheumatoid arthritis, morbid obesity, GERD, OSA on CPAP (noncompliant), hypertension, hyperlipidemia, type 2 diabetes mellitus, hypothyroidism, asthma who presents to the emergency department for evaluation of change in mental status.  Patient was concerned of possible stroke, she states that when she is talking, sometimes her speech turns to gibberish, this has happened intermittently over the course of 1 year.  She also complained of her daughter suspecting her of having seizures, since she was noted 1 day to be shaking while asleep, it took few minutes to be able to wake her up and she was confused for about 5 minutes prior to returning to her baseline of functioning, she has been to neurologist (Dr. Merlene Laughter) who plans to do an EEG on her within the next 1-1/2 weeks.  She complained of excessive sleeping (up to 20 hours daily), increased weakness and difficulty in rising up from bed.  She endorsed more than 20 pound weight gain within the last year.  Her new PCP recently reduced (about a week ago) her levothyroxine dosage.  Friend at bedside states that patient has also been having some memory problems whereby she turned on a stove 1 day and just walked away, and sometimes she opens her refrigerator and forget what she wanted to get out of the refrigerator and also forget to close her refrigerator.  Patient states that while driving yesterday, she looked to the right prior to making a turn, but instead of turning to the right, she turned to the left.  She realized that this may be dangerous, so she decided not to drive again until she is able to resolve her memory issues.  Patient also states that when she walks, it appears as if she was  having high-stepping gait.  She denies chest pain, headache, fever, chills, nausea, vomiting, abdominal pain, constipation or diarrhea.  Patient was recently admitted from 3/14 through 3/16 due to pulmonary alveolar hemorrhage during which pulmonology was consulted due to having hypoxia on ambulation.  She was also diagnosed to have multifocal pneumonia which was treated with IV ceftriaxone and doxycycline. (per MD)   Clinical Impression   Pt agreeable to OT and PT co-evaluation. Pt demonstrates modified independent bed mobility and need for min A to min G assist to boost from EOB today. Ambulation was unsteady with deficits in motor planning to innitiate ambulation, which pt reports has been happening for at least 3 months. Pt demonstrated much improved ambulation when using RW in hall. Pt appears to be near baseline levels for ADL tasks. Pt is not recommended for further acute OT services and will be discharged to care of nursing staff for remaining length of stay.       Recommendations for follow up therapy are one component of a multi-disciplinary discharge planning process, led by the attending physician.  Recommendations may be updated based on patient status, additional functional criteria and insurance authorization.   Follow Up Recommendations  No OT follow up    Assistance Recommended at Discharge Set up Supervision/Assistance  Patient can return home with the following A little help with walking and/or transfers;A little help with bathing/dressing/bathroom;Help with stairs or ramp for entrance;Assist for transportation    Functional Status Assessment  Patient has had a recent decline in their functional status and demonstrates the  ability to make significant improvements in function in a reasonable and predictable amount of time. (see PT follow up)  Equipment Recommendations  None recommended by OT           Precautions / Restrictions Precautions Precautions:  Fall Restrictions Weight Bearing Restrictions: No      Mobility Bed Mobility Overal bed mobility: Modified Independent             General bed mobility comments: Mild labored movement    Transfers Overall transfer level: Needs assistance Equipment used: Straight cane, Rolling walker (2 wheels) Transfers: Sit to/from Stand, Bed to chair/wheelchair/BSC Sit to Stand: Min guard, Min assist     Step pivot transfers: Min guard, Supervision     General transfer comment: Mild assist to boost from EOB with cane. Unsteady with cane but much improved gait when using RW.      Balance Overall balance assessment: Needs assistance Sitting-balance support: No upper extremity supported, Feet supported Sitting balance-Leahy Scale: Good Sitting balance - Comments: seated EOB   Standing balance support: Bilateral upper extremity supported, During functional activity, Reliant on assistive device for balance Standing balance-Leahy Scale: Fair Standing balance comment: using RW                           ADL either performed or assessed with clinical judgement   ADL Overall ADL's : Needs assistance/impaired     Grooming: Supervision/safety;Standing;Wash/dry hands Grooming Details (indicate cue type and reason): standing at sink resting on sink counter             Lower Body Dressing: Maximal assistance;Sitting/lateral leans Lower Body Dressing Details (indicate cue type and reason): Pt assisted as such at baseline. Toilet Transfer: Supervision/safety;Ambulation;Rolling walker (2 wheels) Toilet Transfer Details (indicate cue type and reason): simulated via ambulation in hall with RW and cane         Functional mobility during ADLs: Supervision/safety;Min guard;Rolling walker (2 wheels) General ADL Comments: Unsteady instanding and ambulation with improvement noted when using RW vs cane.     Vision Baseline Vision/History: 1 Wears glasses Ability to See in  Adequate Light: 1 Impaired Patient Visual Report: No change from baseline (Pt reprots she has been having intermittent blurry vision which she saw an optometrist about. No blurry vision today.) Vision Assessment?: No apparent visual deficits     Perception     Praxis Praxis Praxis-Other Comments: Pt reported difficulty in getting her legs to do what her brain was telling them. Observed by slow choppy movements during ambulation.    Pertinent Vitals/Pain Pain Assessment Pain Assessment: No/denies pain     Hand Dominance Right   Extremity/Trunk Assessment Upper Extremity Assessment Upper Extremity Assessment: Generalized weakness   Lower Extremity Assessment Lower Extremity Assessment: Defer to PT evaluation   Cervical / Trunk Assessment Cervical / Trunk Assessment: Normal   Communication Communication Communication: No difficulties   Cognition Arousal/Alertness: Awake/alert Behavior During Therapy: WFL for tasks assessed/performed Overall Cognitive Status: Within Functional Limits for tasks assessed                                                        Home Living Family/patient expects to be discharged to:: Private residence Living Arrangements: Children Available Help at Discharge: Family;Available 24 hours/day Type of Home: House  Home Access: Stairs to enter Entrance Stairs-Number of Steps: 2 (1 large step and 1 small) Entrance Stairs-Rails: None Home Layout: Two level;Laundry or work area in basement;Able to live on main level with bedroom/bathroom Alternate Therapist, sports of Steps: 12 to 15 Alternate Level Stairs-Rails: Right (also has transport chair that is currently not working) Civil engineer, contracting Shower/Tub: Teacher, early years/pre: Handicapped height Bathroom Accessibility: No (not with Rollator which is all pt has tried.)   Home Equipment: Rollator (4 wheels);Cane - single point;Shower seat;Grab bars - tub/shower   Additional  Comments: Pt reported no interest in having a BSC at home.      Prior Functioning/Environment Prior Level of Function : Needs assist       Physical Assist : Mobility (physical);ADLs (physical) Mobility (physical): Gait;Transfers ADLs (physical): IADLs;Dressing Mobility Comments: Pt reports household ambulation with SPC and community ambulation with rollator. Pt reported sometimes her grandchildren will walk in front and behind her if she is having trouble with her ambulation. Uses scooter at grocery store. ADLs Comments: Assisted to don socks from daughter but typically does not wear them. Assisted for cooking and cleaning. Has cleaner every other week.                      OT Goals(Current goals can be found in the care plan section) Acute Rehab OT Goals Patient Stated Goal: return home  OT Frequency:      Co-evaluation PT/OT/SLP Co-Evaluation/Treatment: Yes Reason for Co-Treatment: To address functional/ADL transfers   OT goals addressed during session: ADL's and self-care      AM-PAC OT "6 Clicks" Daily Activity     Outcome Measure Help from another person eating meals?: None Help from another person taking care of personal grooming?: None Help from another person toileting, which includes using toliet, bedpan, or urinal?: None Help from another person bathing (including washing, rinsing, drying)?: None Help from another person to put on and taking off regular upper body clothing?: None Help from another person to put on and taking off regular lower body clothing?: A Lot 6 Click Score: 22   End of Session Equipment Utilized During Treatment: Rolling walker (2 wheels);Gait belt (cane)  Activity Tolerance: Patient tolerated treatment well Patient left: in chair;with call bell/phone within reach  OT Visit Diagnosis: Unsteadiness on feet (R26.81);Other abnormalities of gait and mobility (R26.89);Muscle weakness (generalized) (M62.81)                Time: 3005-1102 OT  Time Calculation (min): 25 min Charges:  OT General Charges $OT Visit: 1 Visit OT Evaluation $OT Eval Low Complexity: 1 Low  Saman Umstead OT, MOT  Larey Seat 12/07/2021, 9:57 AM

## 2021-12-07 NOTE — TOC Progression Note (Addendum)
Transition of Care [facility]) - Progression Note    Patient Details  Name: Yolanda [name] MRN: [id number] Date of Birth: 25-Sep-1955  Transition of Care [facility]) CM/SW Contact  Salome Arnt, [place] Phone Number: 12/07/2021, 10:39 AM  Clinical Narrative:  PT recommending outpatient physical therapy. Discussed with pt who reports she has been to [facility] in the past and would like to be set up there again. Referral made. PT also recommending rolling walker. Discussed with pt. No preferred on agency. Referred to [facility] with [employer] for delivery to room. No other needs reported.           Expected Discharge Plan and Services                                                 Social Determinants of Health (SDOH) Interventions    Readmission Risk Interventions    09/22/2021   11:13 AM  Readmission Risk Prevention Plan  Post Dischage Appt Complete  Medication Screening Complete  Transportation Screening Complete

## 2021-12-07 NOTE — Discharge Summary (Signed)
Yolanda [name], is a 66 y.o. female  DOB 19-Dec-1955  MRN [id number].  Admission date:  12/06/2021  Admitting Physician  Bernadette Hoit, DO  Discharge Date:  12/07/2021   Primary MD  Celene Squibb, MD  Recommendations for primary care physician for things to follow:   1)Please follow-up with Neurologist Dr. Phillips Odor-- Phone: [phone], Address: [address], [place], [place] [address] in 4 to 6 days for recheck and reevaluation and EEG Test.  Please call to make appointment with him - 2)Take Lactulose once daily with goal of 2 to 3 semi-solid stools per day..., Avoid Constipation  3)Reduce HCTZ/Hydrochlorthiazide to 12.5 mg q am---  4) please use your CPAP nightly as advised   Admission Diagnosis  Confusion [R41.0] Altered mental status [R41.82] Acute cystitis with hematuria [N30.01] Altered mental status, unspecified altered mental status type [R41.82]   Discharge Diagnosis  Confusion [R41.0] Altered mental status [R41.82] Acute cystitis with hematuria [N30.01] Altered mental status, unspecified altered mental status type [R41.82]    Principal Problem:   Acute hepatic encephalopathy/nNASH Liver Cirrhosis Active Problems:   Hyperammonemia /Hepatic encephalopathy   Sleep apnea   UTI (urinary tract infection)   Essential hypertension   GERD   Morbid obesity with BMI of 50.0-59.9, adult (HCC)   Seizure (HCC)   Generalized weakness   Hypoalbuminemia due to protein-calorie malnutrition (HCC)   Elevated MCV   Essential tremor   Psoriatic arthritis (HCC)   Asthma, chronic      Past Medical History:  Diagnosis Date   Abnormal uterine bleeding    Anxiety    Arthritis    Calculus of kidney 07/09/2008   Qualifier: Diagnosis of  By: Kellie Simmering LPN, Almyra Free     Chronic abdominal pain    Chronic pain in left foot    Clotting disorder ([place])    Depression    Elevated liver  function tests 2018   Endometriosis    Family history of adverse reaction to anesthesia    Fatty liver    Fibromyalgia    Gastritis    GERD 12/21/2009   Qualifier: Diagnosis of  By: Craige Cotta     History of kidney stones    Hypertension    stopped meds in Aug 2015   Hypothyroidism    IBS (irritable bowel syndrome)    Internal hemorrhoids    Kidney stone    Obesity, morbid ([place]) 09/11/2012   PE 07/09/2008   Qualifier: Diagnosis of  By: Kellie Simmering LPN, Almyra Free     PE (pulmonary embolism)    PONV (postoperative nausea and vomiting)    Sleep apnea    Thyroid disease    Vitamin D deficiency     Past Surgical History:  Procedure Laterality Date   ABDOMINAL SURGERY     laparoscopy   BIOPSY N/A 03/24/2014   Procedure: GASTRIC BIOPSIES;  Surgeon: Danie Binder, MD;  Location: [facility];  Service: Endoscopy;  Laterality: N/A;   BIOPSY  10/28/2019   Procedure: BIOPSY;  Surgeon:  Danie Binder, MD;  [facility];  Service: Endoscopy;;  gastric nodule   BREAST LUMPECTOMY Right    CESAREAN SECTION     X2   COLONOSCOPY  2006   internal hemorrhoids   COLONOSCOPY WITH PROPOFOL N/A 03/24/2014   Dr. [name]: 2 tubular adenomas removed, hemorrhoids   COLONOSCOPY WITH PROPOFOL N/A 10/28/2019   Fields: External and internal hemorrhoids, 8 polyps ranging from 2 to 5 mm in size removed from the colon.  Multiple tubular adenomas.  Next colonoscopy in 3 years.   CYSTOSCOPY W/ RETROGRADES  01/23/2012   Procedure: CYSTOSCOPY WITH RETROGRADE PYELOGRAM;  Surgeon: Marissa Nestle, MD;  Location: [facility];  Service: Urology;  Laterality: Left;   DILATATION & CURETTAGE/HYSTEROSCOPY WITH MYOSURE N/A 11/18/2014   Procedure: DILATATION & CURETTAGE/HYSTEROSCOPY WITH MYOSURE, resection of polyp;  Surgeon: Cheri Fowler, MD;  Location: [facility];  Service: Gynecology;  Laterality: N/A;   DILATION AND CURETTAGE OF UTERUS     ESOPHAGOGASTRODUODENOSCOPY   08/24/2006   Dr. Veto Kemps erythema of the antrum  without erosion or ulcers/Otherwise, normal esophagus without evidence of Barrett's path with chronic gastritis   ESOPHAGOGASTRODUODENOSCOPY (EGD) WITH PROPOFOL N/A 03/24/2014   Dr. [name]: gastritis   ESOPHAGOGASTRODUODENOSCOPY (EGD) WITH PROPOFOL N/A 10/28/2019   Fields: Esophagus appeared normal, empiric dilation due to history of dysphagia.  6 mm nodule seen in the gastric cardia.  Mild portal hypertensive gastropathy.  Nodule from the stomach biopsied and showed mild chronic gastritis, no H. pylori.   POLYPECTOMY N/A 03/24/2014   Procedure: POLYPECTOMY;  Surgeon: Danie Binder, MD;  Location: [facility];  Service: Endoscopy;  Laterality: N/A;   POLYPECTOMY  10/28/2019   Procedure: POLYPECTOMY;  Surgeon: Danie Binder, MD;  [facility];  Service: Endoscopy;;  hepatic flexure, ascending colon,sigmoid colon, rectal   REMOVAL OF STONES  01/23/2012   Procedure: REMOVAL OF STONES;  Surgeon: Marissa Nestle, MD;  Location: [facility];  Service: Urology;  Laterality: N/A;   SAVORY DILATION N/A 10/28/2019   Procedure: SAVORY DILATION;  Surgeon: Danie Binder, MD;  [facility];  Service: Endoscopy;  Laterality: N/A;   TUBAL LIGATION         HPI  from the history and physical done on the day of admission:     HPI: Yolanda [name] is a 66 y.o. female with medical history significant of hepatic cirrhosis, psoriatic arthritis, rheumatoid arthritis, morbid obesity, GERD, OSA on CPAP (noncompliant), hypertension, hyperlipidemia, type 2 diabetes mellitus, hypothyroidism, asthma who presents to the emergency department for evaluation of change in mental status.  Patient was concerned of possible stroke, she states that when she is talking, sometimes her speech turns to gibberish, this has happened intermittently over the course of 1 year.  She also complained of her daughter suspecting her of having seizures, since she was noted 1 day to be shaking while asleep, it took few minutes to be  able to wake her up and she was confused for about 5 minutes prior to returning to her baseline of functioning, she has been to neurologist (Dr. Merlene Laughter) who plans to do an EEG on her within the next 1-1/2 weeks. She complained of excessive sleeping (up to 20 hours daily), increased weakness and difficulty in rising up from bed.  She endorsed more than 20 pound weight gain within the last year.  Her new PCP recently reduced (about a week ago) her levothyroxine dosage. Friend at bedside states that patient has  also been having some memory problems whereby she turned on a stove 1 day and just walked away, and sometimes she opens her refrigerator and forget what she wanted to get out of the refrigerator and also forget to close her refrigerator.  Patient states that while driving yesterday, she looked to the right prior to making a turn, but instead of turning to the right, she turned to the left.  She realized that this may be dangerous, so she decided not to drive again until she is able to resolve her memory issues.  Patient also states that when she walks, it appears as if she was having high-stepping gait. She denies chest pain, headache, fever, chills, nausea, vomiting, abdominal pain, constipation or diarrhea. Patient was recently admitted from 3/14 through 3/16 due to pulmonary alveolar hemorrhage during which pulmonology was consulted due to having hypoxia on ambulation.  She was also diagnosed to have multifocal pneumonia which was treated with IV ceftriaxone and doxycycline.      Hospital Course:    Assessment and Plan: Altered mental status /acute metabolic encephalopathy CT head without contrast showed no evidence of acute intracranial abnormality -MRI brain without acute strokes -Carotid artery Dopplers without hemodynamically significant stenosis -Echocardiogram with preserved EF of 65 to 70% without significant aortic or mitral stenosis On telemetry no significant arrhythmias  noted -TSH 1.5, folate and B12 are not low, BAL negative A1c is 5.4, LDL is 92 HDL is 29 total cholesterol 138 triglycerides 47 -OT eval appreciated recommends no follow-up -PT eval appreciated recommends outpatient physical therapy, --Cannot rule out seizures outpatient follow-up with Dr. Merlene Laughter for EEG and further evaluation advised -Patient refused inpatient EEG at this time , patient and family states they will follow-up with Dr. Merlene Laughter the local neurologist for EEG testing and further evaluation regarding possible seizures -suspect patient's altered mentation/encephalopathy may have been related to elevated ammonia/hepatic encephalopathy in the setting of NASH liver cirrhosis   Klebsiella UTI- POA Patient complained of increased urinary frequency,  -Treated with IV Rocephin, discharged on p.o. Keflex  Hyperammonemia in the setting of history of cirrhosis due to NASH Ammonia level 42 Lactulose will be given Mentation improved significantly after treatment with lactulose- -suspect patient's altered mentation/encephalopathy may have been related to elevated ammonia/hepatic encephalopathy in the setting of NASH liver cirrhosis  Elevated MCV (101.0) -Serum B12 and folate are not low   Hypothyroidism Patient complained of increased weakness, sleepiness, weight gain, cold sensation in warm weather. -TSH 1.5  Hypoalbuminemia possibly secondary to severe protein calorie malnutrition Dietary recommendations discussed  Thrombocytopenia suspect due to underlying NASH liver cirrhosis   Essential tremors----query possibly related to underlying liver cirrhosis Stable   Morbid obesity (BMI 52.22) Diet and lifestyle modification  Essential hypertension Continue Benicar  Mixed hyperlipidemia Continue Crestor  GERD Continue Protonix   OSA on CPAP Continue CPAP  Asthma not in acute exacerbation Continue Ventolin and Dulera   Psoriatic arthritis -Continue outpatient  Remicade Stable  Discharge Condition: stable  Follow UP--- neurologist Dr. Merlene Laughter   Diet and Activity recommendation:  As advised  Discharge Instructions   Discharge Instructions     Ambulatory referral to Physical Therapy   Complete by: As directed    Call MD for:  difficulty breathing, headache or visual disturbances   Complete by: As directed    Call MD for:  persistant dizziness or light-headedness   Complete by: As directed    Call MD for:  persistant nausea and vomiting   Complete by: As  directed    Call MD for:  temperature >100.4   Complete by: As directed    Diet - low sodium heart healthy   Complete by: As directed    Diet Carb Modified   Complete by: As directed    Discharge instructions   Complete by: As directed    1)Please follow-up with Neurologist Dr. Phillips Odor-- Phone: [phone], Address: [address], [place], [place] [address] in 4 to 6 days for recheck and reevaluation and EEG Test.  Please call to make appointment with him - 2)Take Lactulose once daily with goal of 2 to 3 semi-solid stools per day..., Avoid Constipation  3)Reduce HCTZ/Hydrochlorthiazide to 12.5 mg q am---  4) please use your CPAP nightly as advised   Increase activity slowly   Complete by: As directed         Discharge Medications     Allergies as of 12/07/2021       Reactions   Azithromycin Hives   Lisinopril Cough   Sulfonamide Derivatives Other (See Comments)   Vaginal Infection         Medication List     STOP taking these medications    Carafate 1 g tablet Generic drug: sucralfate   cyclobenzaprine 10 MG tablet Commonly known as: FLEXERIL   olmesartan-hydrochlorothiazide 40-25 MG tablet Commonly known as: BENICAR HCT Replaced by: olmesartan-hydrochlorothiazide 40-12.5 MG tablet   traMADol 50 MG tablet Commonly known as: ULTRAM       TAKE these medications    albuterol 108 (90 Base) MCG/ACT inhaler Commonly known as: VENTOLIN  HFA Inhale 1-2 puffs into the lungs every 6 (six) hours as needed for wheezing or shortness of breath.   bisacodyl 5 MG EC tablet Generic drug: bisacodyl Take 10 mg by mouth daily as needed.   blood glucose meter kit and supplies Dispense based on patient and insurance preference. Use up to four times daily as directed. (FOR ICD-10 E10.9, E11.9).   Breztri Aerosphere 160-9-4.8 MCG/ACT Aero Generic drug: Budeson-Glycopyrrol-Formoterol Inhale 2 puffs into the lungs in the morning and at bedtime.   budesonide-formoterol 80-4.5 MCG/ACT inhaler Commonly known as: Symbicort Take 2 puffs first thing in am and then another 2 puffs about 12 hours later.   cephALEXin 500 MG capsule Commonly known as: Keflex Take 1 capsule (500 mg total) by mouth 3 (three) times daily for 3 days. Start taking on: December 08, 2021   CRANBERRY PO Take 500 mg by mouth daily.   DULoxetine 60 MG capsule Commonly known as: CYMBALTA Take 60 mg by mouth daily.   fluconazole 150 MG tablet Commonly known as: Diflucan Take 1 tablet (150 mg total) by mouth once for 1 dose. For yeast infection Start taking on: December 10, 2021   lactulose (encephalopathy) 10 GM/15ML Soln Commonly known as: CHRONULAC Take 30 mLs (20 g total) by mouth daily. Ok to take additional dose if No BM in 24 hrs   Levothyroxine Sodium 100 MCG Caps Take 75 mcg by mouth daily before breakfast.   meclizine 25 MG tablet Commonly known as: ANTIVERT Take 25 mg by mouth 3 (three) times daily as needed for dizziness.   Melatonin 10 MG Caps Take 10 mg by mouth at bedtime as needed (sleep).   metFORMIN 500 MG 24 hr tablet Commonly known as: GLUCOPHAGE-XR 1 po with supper x 1 week, then 1 po BID WC x 1 week, then 2 po BID WC What changed:  how much to take how to take  this when to take this additional instructions   olmesartan-hydrochlorothiazide 40-12.5 MG tablet Commonly known as: Benicar HCT Take 1 tablet by mouth daily. Replaces:  olmesartan-hydrochlorothiazide 40-25 MG tablet   omeprazole 20 MG capsule Commonly known as: PRILOSEC Take 1 capsule (20 mg total) by mouth 2 (two) times daily before a meal. 1 po 30 mins prior to [place]. What changed:  when to take this reasons to take this additional instructions   REMICADE IV Inject into the vein. As directed   rosuvastatin 10 MG tablet Commonly known as: CRESTOR Take 10 mg by mouth daily.   Vitamin D3 50 MCG (2000 UT) Tabs Take 2,000 Units by mouth daily.               Durable Medical Equipment  (From admission, onward)           Start     Ordered   12/07/21 1413  For home use only DME Walker rolling  Once       Question Answer Comment  Walker: With [place]   Patient needs a walker to treat with the following condition Gait difficulty      12/07/21 1413   12/07/21 1044  For home use only DME Walker rolling  Once       Question Answer Comment  Walker: With 5 Inch Wheels   Patient needs a walker to treat with the following condition Altered mental state      12/07/21 1044            Major procedures and Radiology Reports - PLEASE review detailed and final reports for all details, in brief -  CT Head Wo Contrast  Result Date: 12/06/2021 CLINICAL DATA:  Altered mental status, nontraumatic (Ped 0-17y) EXAM: CT HEAD WITHOUT CONTRAST TECHNIQUE: Contiguous axial images were obtained from the base of the skull through the vertex without intravenous contrast. RADIATION DOSE REDUCTION: This exam was performed according to the departmental dose-optimization program which includes automated exposure control, adjustment of the mA and/or kV according to patient size and/or use of iterative reconstruction technique. COMPARISON:  MRI September 10, 2019. FINDINGS: Brain: No evidence of acute infarction, hemorrhage, hydrocephalus, extra-axial collection or mass lesion/mass effect. Mild patchy white matter hypodensities, nonspecific but compatible with  chronic microvascular ischemic disease. Vascular: No hyperdense vessel identified. Skull: No acute fracture Sinuses/Orbits: Clear sinuses.  No acute orbital findings. Other: No mastoid effusions. IMPRESSION: No evidence of acute intracranial abnormality. Electronically Signed   By: Margaretha Sheffield M.D.   On: 12/06/2021 15:44   MR BRAIN WO CONTRAST  Result Date: 12/07/2021 CLINICAL DATA:  Neuro deficit, acute, stroke suspected EXAM: MRI HEAD WITHOUT CONTRAST TECHNIQUE: Multiplanar, multiecho pulse sequences of the brain and surrounding structures were obtained without intravenous contrast. COMPARISON:  None Available. FINDINGS: Brain: There is no acute infarction or intracranial hemorrhage. There is no intracranial mass, mass effect, or edema. There is no hydrocephalus or extra-axial fluid collection. Ventricles and sulci are normal in size and configuration. Patchy foci of T2 hyperintensity in the supratentorial white matter are nonspecific but may reflect similar mild chronic microvascular ischemic changes. Vascular: Major vessel flow voids at the skull base are preserved. Skull and upper cervical spine: Normal marrow signal is preserved. Sinuses/Orbits: Paranasal sinuses are aerated. Orbits are unremarkable. Other: Sella is unremarkable.  Mastoid air cells are clear. IMPRESSION: No acute infarction, hemorrhage, or mass. Similar probable mild chronic microvascular ischemic changes. Electronically Signed   By: Addison Lank.D.  On: 12/07/2021 08:42   US Carotid Bilateral  Result Date: 12/07/2021 CLINICAL DATA:  Mental status change and hypertension. EXAM: BILATERAL CAROTID DUPLEX ULTRASOUND TECHNIQUE: Pearline Cables scale imaging, color Doppler and duplex ultrasound were performed of bilateral carotid and vertebral arteries in the neck. COMPARISON:  None Available. FINDINGS: Criteria: Quantification of carotid stenosis is based on velocity parameters that correlate the residual internal carotid diameter with  NASCET-based stenosis levels, using the diameter of the distal internal carotid lumen as the denominator for stenosis measurement. The following velocity measurements were obtained: RIGHT ICA:  117/27 cm/sec CCA:  84/69 cm/sec SYSTOLIC ICA/CCA RATIO:  1.2 ECA:  82 cm/sec LEFT ICA:  139/34 distal, 107/19 proximal cm/sec CCA:  629/52 cm/sec SYSTOLIC ICA/CCA RATIO:  1.2 ECA:  97 cm/sec RIGHT CAROTID ARTERY: Mild amount calcified plaque at the level of the carotid bulb and proximal right ICA. Estimated right ICA stenosis is less than 50%. RIGHT VERTEBRAL ARTERY: Antegrade flow with normal waveform and velocity. LEFT CAROTID ARTERY: Mild amount of calcified plaque at the level of the carotid bulb and left ICA origin. Estimated left ICA stenosis is less than 50%. LEFT VERTEBRAL ARTERY: Antegrade flow with normal waveform and velocity. IMPRESSION: Mild amount of calcified plaque at the level of both carotid bulbs and proximal internal carotid arteries. Estimated bilateral ICA stenoses are less than 50%. Electronically Signed   By: Aletta Edouard M.D.   On: 12/07/2021 13:55   ECHOCARDIOGRAM COMPLETE  Result Date: 12/07/2021    ECHOCARDIOGRAM REPORT   Patient Name:   ARIES KASA Date of Exam: 12/07/2021 Medical Rec #:  [id number]          Height:       62.0 in Accession #:    [id number]         Weight:       285.5 lb Date of Birth:  [birth year]         BSA:          2.224 m Patient Age:    65 years           BP:           133/72 mmHg Patient Gender: F                  HR:           83 bpm. Exam Location:  [facility] Procedure: 2D Echo, Cardiac Doppler, Color Doppler and Intracardiac            Opacification Agent Indications:    Stroke  History:        Patient has prior history of Echocardiogram examinations, most                 recent 09/12/2012. Stroke; Risk Factors:Hypertension, Dyslipidemia                 and Former Smoker.  Sonographer:    Wenda Low Referring Phys: [id number] OLADAPO ADEFESO  Sonographer  Comments: Patient is morbidly obese. IMPRESSIONS  1. Left ventricular ejection fraction, by estimation, is 65 to 70%. The left ventricle has normal function. The left ventricle has no regional wall motion abnormalities. There is mild left ventricular hypertrophy. Left ventricular diastolic parameters are indeterminate.  2. Right ventricular systolic function is normal. The right ventricular size is normal. Tricuspid regurgitation signal is inadequate for assessing PA pressure.  3. The mitral valve is normal in structure. No evidence of mitral valve regurgitation. No evidence of mitral stenosis.  4.  The aortic valve was not well visualized. Aortic valve regurgitation is not visualized. No aortic stenosis is present. FINDINGS  Left Ventricle: Left ventricular ejection fraction, by estimation, is 65 to 70%. The left ventricle has normal function. The left ventricle has no regional wall motion abnormalities. Definity contrast agent was given IV to delineate the left ventricular  endocardial borders. The left ventricular internal cavity size was normal in size. There is mild left ventricular hypertrophy. Left ventricular diastolic parameters are indeterminate. Right Ventricle: The right ventricular size is normal. No increase in right ventricular wall thickness. Right ventricular systolic function is normal. Tricuspid regurgitation signal is inadequate for assessing PA pressure. Left Atrium: Left atrial size was normal in size. Right Atrium: Right atrial size was normal in size. Pericardium: The pericardium was not well visualized. Mitral Valve: The mitral valve is normal in structure. No evidence of mitral valve regurgitation. No evidence of mitral valve stenosis. MV peak gradient, 8.6 mmHg. The mean mitral valve gradient is 4.0 mmHg. Tricuspid Valve: The tricuspid valve is not well visualized. Tricuspid valve regurgitation is not demonstrated. No evidence of tricuspid stenosis. Aortic Valve: The aortic valve was not  well visualized. Aortic valve regurgitation is not visualized. No aortic stenosis is present. Aortic valve mean gradient measures 8.0 mmHg. Aortic valve peak gradient measures 13.5 mmHg. Aortic valve area, by VTI measures 2.11 cm. Pulmonic Valve: The pulmonic valve was not well visualized. Pulmonic valve regurgitation is not visualized. No evidence of pulmonic stenosis. Aorta: The aortic root is normal in size and structure. IAS/Shunts: The interatrial septum was not well visualized.  LEFT VENTRICLE PLAX 2D LVIDd:         4.25 cm   Diastology LVIDs:         2.30 cm   LV e' medial:    7.62 cm/s LV PW:         1.30 cm   LV E/e' medial:  14.4 LV IVS:        1.25 cm   LV e' lateral:   6.42 cm/s LVOT diam:     2.00 cm   LV E/e' lateral: 17.1 LV SV:         89 LV SV Index:   40 LVOT Area:     3.14 cm  RIGHT VENTRICLE RV Basal diam:  3.30 cm RV Mid diam:    2.70 cm RV S prime:     19.50 cm/s TAPSE (M-mode): 2.5 cm LEFT ATRIUM             Index        RIGHT ATRIUM           Index LA diam:        4.70 cm 2.11 cm/m   RA Area:     17.90 cm LA Vol (A2C):   52.0 ml 23.38 ml/m  RA Volume:   51.80 ml  23.29 ml/m LA Vol (A4C):   62.8 ml 28.24 ml/m LA Biplane Vol: 60.9 ml 27.38 ml/m  AORTIC VALVE                     PULMONIC VALVE AV Area (Vmax):    2.07 cm      PV Vmax:       1.32 m/s AV Area (Vmean):   1.92 cm      PV Peak grad:  7.0 mmHg AV Area (VTI):     2.11 cm AV Vmax:  184.00 cm/s AV Vmean:          134.000 cm/s AV VTI:            0.422 m AV Peak Grad:      13.5 mmHg AV Mean Grad:      8.0 mmHg LVOT Vmax:         121.00 cm/s LVOT Vmean:        81.900 cm/s LVOT VTI:          0.283 m LVOT/AV VTI ratio: 0.67  AORTA Ao Root diam: 2.90 cm MITRAL VALVE MV Area (PHT): 2.42 cm     SHUNTS MV Area VTI:   2.71 cm     Systemic VTI:  0.28 m MV Peak grad:  8.6 mmHg     Systemic Diam: 2.00 cm MV Mean grad:  4.0 mmHg MV Vmax:       1.47 m/s MV Vmean:      94.6 cm/s MV Decel Time: 313 msec MV E velocity: 110.00 cm/s MV  A velocity: 119.00 cm/s MV E/A ratio:  0.92 Carlyle Dolly MD Electronically signed by Carlyle Dolly MD Signature Date/Time: 12/07/2021/1:55:17 PM    Final     Micro Results   Today   Subjective    Ludwig Clarks today has no new complaints'- -Family at bedside -Mentation improved significantly after bowel movements with lactulose -   Patient has been seen and examined prior to discharge   Objective   Blood pressure (!) 151/78, pulse 89, temperature 98.3 F (36.8 C), temperature source Oral, resp. rate 20, height _0  (1.575 m), weight 129.5 kg, last menstrual period 07/11/2007, SpO2 97 %.   Intake/Output Summary (Last 24 hours) at 12/07/2021 1614 Last data filed at 12/07/2021 0700 Gross per 24 hour  Intake 343.8 ml  Output --  Net 343.8 ml    Exam Gen:- Awake Alert, no acute distress , obese HEENT:- [place].AT, No sclera icterus Neck-Supple Neck,No JVD,.  Lungs-  CTAB , good air movement bilaterally CV- S1, S2 normal, regular Abd-  +ve B.Sounds, Abd Soft, No tenderness, increased truncal adiposity Extremity/Skin:- No  edema,   good pulses Psych-affect is appropriate, oriented x3--- as per family members mentation appears to be back to baseline Neuro-no new focal deficits, chronic tremors, not new   Data Review   CBC w Diff:  Lab Results  Component Value Date   WBC 8.1 12/07/2021   HGB 13.5 12/07/2021   HGB 15.7 12/01/2019   HCT 40.6 12/07/2021   HCT 45.9 12/01/2019   PLT 118 (L) 12/07/2021   PLT 195 12/01/2019   LYMPHOPCT 22 09/20/2021   MONOPCT 15 09/20/2021   EOSPCT 0 09/20/2021   BASOPCT 1 09/20/2021    CMP:  Lab Results  Component Value Date   NA 137 12/07/2021   NA 144 12/01/2019   K 3.9 12/07/2021   CL 105 12/07/2021   CO2 29 12/07/2021   BUN 15 12/07/2021   BUN 13 12/01/2019   CREATININE 0.60 12/07/2021   CREATININE 0.59 07/08/2020   PROT 8.0 12/07/2021   PROT 7.3 12/01/2019   ALBUMIN 1.8 (L) 12/07/2021   ALBUMIN 3.5 (L) 12/01/2019    BILITOT 1.1 12/07/2021   BILITOT 0.9 12/01/2019   ALKPHOS 74 12/07/2021   AST 51 (H) 12/07/2021   ALT 32 12/07/2021  .  Total Discharge time is about 33 minutes  Roxan Hockey M.D on 12/07/2021 at 4:14 PM  Go to [email or web address] -  for contact info  [facility] -  Office  [phone]

## 2021-12-08 ENCOUNTER — Ambulatory Visit (HOSPITAL_COMMUNITY)
Admission: RE | Admit: 2021-12-08 | Discharge: 2021-12-08 | Disposition: A | Discharge status: Home / Self Care | Payer: Medicare Other | Source: Ambulatory Visit | Attending: Internal Medicine | Admitting: Internal Medicine

## 2021-12-08 DIAGNOSIS — K7581 Nonalcoholic steatohepatitis (NASH): Secondary | ICD-10-CM | POA: Diagnosis present

## 2021-12-08 DIAGNOSIS — K7460 Unspecified cirrhosis of liver: Secondary | ICD-10-CM | POA: Insufficient documentation

## 2021-12-09 LAB — URINE CULTURE: Culture: >=100000 — AB

## 2021-12-12 ENCOUNTER — Encounter: Payer: Medicare Other | Attending: Family Medicine | Admitting: Nutrition

## 2021-12-12 VITALS — Ht 64.0 in | Wt 286.0 lb

## 2021-12-12 DIAGNOSIS — I10 Essential (primary) hypertension: Secondary | ICD-10-CM | POA: Diagnosis present

## 2021-12-12 DIAGNOSIS — E8881 Metabolic syndrome: Secondary | ICD-10-CM | POA: Diagnosis present

## 2021-12-12 DIAGNOSIS — E782 Mixed hyperlipidemia: Secondary | ICD-10-CM | POA: Insufficient documentation

## 2021-12-12 DIAGNOSIS — E1165 Type 2 diabetes mellitus with hyperglycemia: Secondary | ICD-10-CM | POA: Diagnosis present

## 2021-12-12 DIAGNOSIS — E1100 Type 2 diabetes mellitus with hyperosmolarity without nonketotic hyperglycemic-hyperosmolar coma (NKHHC): Secondary | ICD-10-CM | POA: Insufficient documentation

## 2021-12-12 DIAGNOSIS — K7581 Nonalcoholic steatohepatitis (NASH): Secondary | ICD-10-CM | POA: Diagnosis present

## 2021-12-12 DIAGNOSIS — K7460 Unspecified cirrhosis of liver: Secondary | ICD-10-CM | POA: Insufficient documentation

## 2021-12-12 NOTE — Patient Instructions (Signed)
Goals Keep up the great job. Get up at 8 am to eat breakfast and eat dinner by 7pm Increase low carb vegetables Walk in the house when you can Keep up the great job. Lose 1 lb per week Don't skip meals

## 2021-12-12 NOTE — Progress Notes (Signed)
Medical Nutrition Therapy Follow up  Appointment Start time:  1115  Appointment End time:  1145  Primary concerns today: DX Dm Type 2 in ER with recent hospitalization. A1C 5.7%, but has multiple blood sugars > 200 randomly and elevated FBS > 150's, Obesity, Fatty Liver   Referral diagnosis: E11.69, R73.9, K76, E66.01  Preferred learning style: No preference  Learning readiness: Ready    NUTRITION ASSESSMENT Follow up Lost 11 lbs.  Dr. Margo Aye said he has diabetes. Metformin 500 mg BID. A1C 5.7%. Testing BS in am and before bed. Has recently lowered her thyroid medication by Dr. Margo Aye  Had to go to ER due to confusion. Doing more vegggies and fruits  Will be going to Physical therapy. ON oxygen PRN and  Back on sleep apnea machine.    Current diet of eating 1-2 meals per day and grazing isn't meeting her nutritional needs. Current diet is poor in whole foods/plant based and usually high in processed foods.   Anthropometrics  Wt Readings from Last 3 Encounters:  12/06/21 285 lb 7.9 oz (129.5 kg)  11/25/21 (!) 301 lb (136.5 kg)  10/31/21 297 lb (134.7 kg)   Ht Readings from Last 3 Encounters:  12/06/21 5\' 2"  (1.575 m)  11/25/21 5\' 3"  (1.6 m)  10/31/21 5\' 3"  (1.6 m)   There is no height or weight on file to calculate BMI. @BMIFA @ Facility age limit for growth percentiles is 20 years. Facility age limit for growth percentiles is 20 years.    Clinical Medical Hx: See chart Medications: Metformin 500 mg once a day, but prescribed BID Labs:  Lab Results  Component Value Date   HGBA1C 5.4 12/07/2021      Latest Ref Rng & Units 12/07/2021    4:58 AM 12/06/2021    3:51 PM 12/06/2021    1:21 PM  CMP  Glucose 70 - 99 mg/dL 161    096    BUN 8 - 23 mg/dL 15    11    Creatinine 0.44 - 1.00 mg/dL 0.45    4.09    Sodium 135 - 145 mmol/L 137    135    Potassium 3.5 - 5.1 mmol/L 3.9    4.0    Chloride 98 - 111 mmol/L 105    101    CO2 22 - 32 mmol/L 29    30    Calcium 8.9 -  10.3 mg/dL 8.5    8.2    Total Protein 6.5 - 8.1 g/dL 8.0   8.5     Total Bilirubin 0.3 - 1.2 mg/dL 1.1   1.2     Alkaline Phos 38 - 126 U/L 74   80     AST 15 - 41 U/L 51   58     ALT 0 - 44 U/L 32   36      Lipid Panel     Component Value Date/Time   CHOL 130 12/07/2021 0458   TRIG 47 12/07/2021 0458   HDL 29 (L) 12/07/2021 0458   CHOLHDL 4.5 12/07/2021 0458   VLDL 9 12/07/2021 0458   LDLCALC 92 12/07/2021 0458    Notable Signs/Symptoms: Fatigue, poor balance, HIgh and low blood sugars  Lifestyle & Dietary Hx Her daughter and niece live with her. Limited mobility. Uses a can. Unsteady on feet. Eats 1-2 meals per day. Likes to snack on stuff. Eats late at night. Likes junk food.  Estimated daily fluid intake: 36 oz Supplements: see chart Sleep:  5-6 Stress / self-care: varies Current average weekly physical activity: ADL  24-Hr Dietary Recall B) Granola cereal with almond. water L)  leftovers, banana , water D) 1/2 burger, pasta salad, water   Estimated Energy Needs Calories: 1200 Carbohydrate: 135g Protein: 90g Fat: 33g   NUTRITION DIAGNOSIS  NB-1.1 Food and nutrition-related knowledge deficit As related to Diabetes Type 2.  As evidenced by 30 DAY AVG bs 157. Random blood sugars > 200 with increased thirsty and frequent urination and dry mouth..   NUTRITION INTERVENTION  Nutrition education (E-1) on the following topics:  Nutrition and Diabetes education provided on My Plate, CHO counting, meal planning, portion sizes, timing of meals, avoiding snacks between meals unless having a low blood sugar, target ranges for A1C and blood sugars, signs/symptoms and treatment of hyper/hypoglycemia, monitoring blood sugars, taking medications as prescribed, benefits of exercising 30 minutes per day and prevention of complications of DM.    Lifestyle Medicine - Whole Food, Plant Predominant Nutrition is highly recommended: Eat Plenty of vegetables, Mushrooms, fruits, Legumes,  Whole Grains, Nuts, seeds in lieu of processed meats, processed snacks/pastries red meat, poultry, eggs.    -It is better to avoid simple carbohydrates including: Cakes, Sweet Desserts, Ice Cream, Soda (diet and regular), Sweet Tea, Candies, Chips, Cookies, Store Bought Juices, Alcohol in Excess of  1-2 drinks a day, Lemonade,  Artificial Sweeteners, Doughnuts, Coffee Creamers, "Sugar-free" Products, etc, etc.  This is not a complete list.....  Exercise: If you are able: 30 -60 minutes a day ,4 days a week, or 150 minutes a week.  The longer the better.  Combine stretch, strength, and aerobic activities.  If you were told in the past that you have high risk for cardiovascular diseases, you may seek evaluation by your heart doctor prior to initiating moderate to intense exercise programs.  Handouts Provided Include  Lifestyle Nutrition,  Meal Plan Card Calorie density of foods.  Learning Style & Readiness for Change Teaching method utilized: Visual & Auditory  Demonstrated degree of understanding via: Teach Back  Barriers to learning/adherence to lifestyle change: Mobility  Goals Established by Pt Goals  Goals Keep up the great job. Get up at 8 am to eat breakfast and eat dinner by 7pm Increase low carb vegetables Walk in the house when you can Keep up the great job. Lose 1 lb per week Don't skip meals  MONITORING & EVALUATION Dietary intake, weekly physical activity, and weight and blood sugars in 3 month.  Next Steps  Patient is to request referral to [facility] for thryoid issues.

## 2021-12-21 ENCOUNTER — Ambulatory Visit (INDEPENDENT_AMBULATORY_CARE_PROVIDER_SITE_OTHER): Payer: Medicare Other | Admitting: Internal Medicine

## 2021-12-21 ENCOUNTER — Other Ambulatory Visit: Payer: Self-pay | Admitting: *Deleted

## 2021-12-21 ENCOUNTER — Encounter: Payer: Self-pay | Admitting: Internal Medicine

## 2021-12-21 ENCOUNTER — Telehealth: Payer: Self-pay

## 2021-12-21 VITALS — BP 119/70 | HR 111 | Temp 98.0°F | Ht 64.0 in | Wt 285.2 lb

## 2021-12-21 DIAGNOSIS — K7460 Unspecified cirrhosis of liver: Secondary | ICD-10-CM

## 2021-12-21 DIAGNOSIS — K7682 Hepatic encephalopathy: Secondary | ICD-10-CM

## 2021-12-21 DIAGNOSIS — E8809 Other disorders of plasma-protein metabolism, not elsewhere classified: Secondary | ICD-10-CM | POA: Diagnosis not present

## 2021-12-21 DIAGNOSIS — K219 Gastro-esophageal reflux disease without esophagitis: Secondary | ICD-10-CM | POA: Diagnosis not present

## 2021-12-21 DIAGNOSIS — K7581 Nonalcoholic steatohepatitis (NASH): Secondary | ICD-10-CM

## 2021-12-21 DIAGNOSIS — E46 Unspecified protein-calorie malnutrition: Secondary | ICD-10-CM

## 2021-12-21 MED ORDER — RIFAXIMIN 550 MG PO TABS: 550.0000 mg | ORAL_TABLET | Freq: Two times a day (BID) | ORAL | 11 refills | Status: DC

## 2021-12-21 NOTE — Patient Instructions (Addendum)
For your hepatic encephalopathy, I want you to increase your lactulose to 45 mL twice daily.  Goal of at least 2 bowel movements daily.  I will send in a prescription for Xifaxan as well.  We will likely need to perform prior authorization for this medication.  Just let us know.  I will order MRI of your liver given incomplete study with ultrasound.  Recommend high-protein low-salt diet.  I am going to order blood work today including CMP, PT/INR, ammonia level, alpha-fetoprotein.  Follow-up in 6 weeks.  It was good seeing both of you  today.  Dr. Abbey Chatters

## 2021-12-21 NOTE — Progress Notes (Signed)
Referring Provider: Celene Squibb, MD Primary Care Physician:  Celene Squibb, MD Primary GI:  Dr. Abbey Chatters  Chief Complaint  Patient presents with   NASH    Patient here today due to needing follow up on [name] and Hepatic encephalopathy. She is having some issues with confusion. Was admitted at [facility] on 12/06/2021. She is taking Lactulose 60 ml per day. Patient states the color of her stools are pale in color, and states she sees bright red in the stools at times,she has some loose stools at times. She also mentions she may have some small hemorrhoids.      HPI:   Yolanda [name] is a 66 y.o. female who presents to the clinic today for follow-up visit regarding Yolanda [name] cirrhosis.  Historically well compensated with low meld of 7 on previous visit in August.  She was admitted to [facility] 12/06/2021 with encephalopathy.  Underwent significant work-up including CT head, MRI brain, carotid artery Dopplers, WNL.  Has follow-up with neurology for EEG.  Ammonia was elevated.  Started on lactulose therapy and patient clinically improved subsequently discharged.  Did have UTI treated with Rocephin discharged on Keflex.  Denies any swelling or abdominal distention.  No history of ascites.    EGD 10/28/2019 showed no evidence of esophageal varices.  Colonoscopy at the same time did show multiple polyps which were tubular adenomas on pathology.  GERD maintained on omeprazole 20 mg daily.    Ultrasound 12/08/2021 showed cirrhosis, no hepatoma there was a poor study given her body habitus.  Recommended MRI to further evaluate.  Today she is accompanied by her daughter.  States that she is doing better since her hospitalization though continues to have intermittent confusion.  Much better compared to what she was however.  Taking lactulose 30 mL twice daily.  Past Medical History:  Diagnosis Date   Abnormal uterine bleeding    Anxiety    Arthritis    Calculus of kidney 07/09/2008    Qualifier: Diagnosis of  By: Kellie Simmering LPN, Almyra Free     Chronic abdominal pain    Chronic pain in left foot    Clotting disorder ([place])    Depression    Elevated liver function tests 2018   Endometriosis    Family history of adverse reaction to anesthesia    Fatty liver    Fibromyalgia    Gastritis    GERD 12/21/2009   Qualifier: Diagnosis of  By: Craige Cotta     History of kidney stones    Hypertension    stopped meds in Aug 2015   Hypothyroidism    IBS (irritable bowel syndrome)    Internal hemorrhoids    Kidney stone    Obesity, morbid ([place]) 09/11/2012   PE 07/09/2008   Qualifier: Diagnosis of  By: Kellie Simmering LPN, Almyra Free     PE (pulmonary embolism)    PONV (postoperative nausea and vomiting)    Sleep apnea    Thyroid disease    Vitamin D deficiency     Past Surgical History:  Procedure Laterality Date   ABDOMINAL SURGERY     laparoscopy   BIOPSY N/A 03/24/2014   Procedure: GASTRIC BIOPSIES;  Surgeon: Danie Binder, MD;  Location: [facility];  Service: Endoscopy;  Laterality: N/A;   BIOPSY  10/28/2019   Procedure: BIOPSY;  Surgeon: Danie Binder, MD;  Location: [facility];  Service: Endoscopy;;  gastric nodule   BREAST LUMPECTOMY Right    CESAREAN  SECTION     X2   COLONOSCOPY  2006   internal hemorrhoids   COLONOSCOPY WITH PROPOFOL N/A 03/24/2014   Dr. [name]: 2 tubular adenomas removed, hemorrhoids   COLONOSCOPY WITH PROPOFOL N/A 10/28/2019   Fields: External and internal hemorrhoids, 8 polyps ranging from 2 to 5 mm in size removed from the colon.  Multiple tubular adenomas.  Next colonoscopy in 3 years.   CYSTOSCOPY W/ RETROGRADES  01/23/2012   Procedure: CYSTOSCOPY WITH RETROGRADE PYELOGRAM;  Surgeon: Marissa Nestle, MD;  Location: [facility];  Service: Urology;  Laterality: Left;   DILATATION & CURETTAGE/HYSTEROSCOPY WITH MYOSURE N/A 11/18/2014   Procedure: DILATATION & CURETTAGE/HYSTEROSCOPY WITH MYOSURE, resection of polyp;  Surgeon: Cheri Fowler, MD;  Location: [facility];   Service: Gynecology;  Laterality: N/A;   DILATION AND CURETTAGE OF UTERUS     ESOPHAGOGASTRODUODENOSCOPY   08/24/2006   Dr. Veto Kemps erythema of the antrum without erosion or ulcers/Otherwise, normal esophagus without evidence of Barrett's path with chronic gastritis   ESOPHAGOGASTRODUODENOSCOPY (EGD) WITH PROPOFOL N/A 03/24/2014   Dr. [name]: gastritis   ESOPHAGOGASTRODUODENOSCOPY (EGD) WITH PROPOFOL N/A 10/28/2019   Fields: Esophagus appeared normal, empiric dilation due to history of dysphagia.  6 mm nodule seen in the gastric cardia.  Mild portal hypertensive gastropathy.  Nodule from the stomach biopsied and showed mild chronic gastritis, no H. pylori.   POLYPECTOMY N/A 03/24/2014   Procedure: POLYPECTOMY;  Surgeon: Danie Binder, MD;  Location: [facility];  Service: Endoscopy;  Laterality: N/A;   POLYPECTOMY  10/28/2019   Procedure: POLYPECTOMY;  Surgeon: Danie Binder, MD;  Location: [facility];  Service: Endoscopy;;  hepatic flexure, ascending colon,sigmoid colon, rectal   REMOVAL OF STONES  01/23/2012   Procedure: REMOVAL OF STONES;  Surgeon: Marissa Nestle, MD;  Location: [facility];  Service: Urology;  Laterality: N/A;   SAVORY DILATION N/A 10/28/2019   Procedure: SAVORY DILATION;  Surgeon: Danie Binder, MD;  Location: [facility];  Service: Endoscopy;  Laterality: N/A;   TUBAL LIGATION      Current Outpatient Medications  Medication Sig Dispense Refill   albuterol (VENTOLIN HFA) 108 (90 Base) MCG/ACT inhaler Inhale 1-2 puffs into the lungs every 6 (six) hours as needed for wheezing or shortness of breath. 6.7 g 0   bisacodyl (BISACODYL) 5 MG EC tablet Take 10 mg by mouth daily as needed.     blood glucose meter kit and supplies Dispense based on patient and insurance preference. Use up to four times daily as directed. (FOR ICD-10 E10.9, E11.9). 1 each 0   budesonide-formoterol (SYMBICORT) 80-4.5 MCG/ACT inhaler Take 2 puffs first thing in am and then another 2 puffs about 12  hours later. 1 each 12   Cholecalciferol (VITAMIN D3) 2000 units TABS Take 2,000 Units by mouth daily.      CRANBERRY PO Take 500 mg by mouth daily.     DULoxetine (CYMBALTA) 60 MG capsule Take 60 mg by mouth daily.     inFLIXimab (REMICADE IV) Inject into the vein. Q three months per patient.     lactulose, encephalopathy, (CHRONULAC) 10 GM/15ML SOLN Take 30 mLs (20 g total) by mouth daily. Ok to take additional dose if No BM in 24 hrs (Patient taking differently: Take 20 g by mouth daily. Ok to take additional dose if No BM in 24 hrs) 946 mL 1   Levothyroxine Sodium 100 MCG CAPS Take 75 mcg by mouth daily before breakfast.     meclizine (  ANTIVERT) 25 MG tablet Take 25 mg by mouth 3 (three) times daily as needed for dizziness.     Melatonin 10 MG CAPS Take 10 mg by mouth at bedtime as needed (sleep).     metFORMIN (GLUCOPHAGE-XR) 500 MG 24 hr tablet 1 po with supper x 1 week, then 1 po BID WC x 1 week, then 2 po BID WC (Patient taking differently: Take 500 mg by mouth 2 (two) times daily.) 120 tablet 1   olmesartan-hydrochlorothiazide (BENICAR HCT) 40-12.5 MG tablet Take 1 tablet by mouth daily. 30 tablet 3   omeprazole (PRILOSEC) 20 MG capsule Take 1 capsule (20 mg total) by mouth 2 (two) times daily before a meal. 1 po 30 mins prior to [place]. (Patient taking differently: Take 20 mg by mouth 2 (two) times daily as needed (gerd).) 180 capsule 3   rosuvastatin (CRESTOR) 10 MG tablet Take 10 mg by mouth daily.     No current facility-administered medications for this visit.    Allergies as of 12/21/2021 - Review Complete 12/21/2021  Allergen Reaction Noted   Azithromycin Hives    Lisinopril Cough 04/19/2017   Sulfonamide derivatives Other (See Comments)     Family History  Problem Relation Age of Onset   Heart attack Mother        CABG   Stroke Mother    Hypertension Mother    Cancer Mother    Diabetes Mother    Heart attack Father        CABG   Hypertension Father     Mesothelioma Father    Heart attack Brother    Diabetes Brother    Depression Brother    Hyperlipidemia Brother    Diabetes Maternal Grandmother    Diabetes Maternal Grandfather    Cancer Paternal Grandmother    Diabetes Brother    Hyperlipidemia Brother    Wilson's disease Other        2 nieces, one died in her 10s   Colon cancer Neg Hx     Social History   Socioeconomic History   Marital status: Divorced    Spouse name: Not on file   Number of children: 2   Years of education: 83   Highest education level: Not on file  Occupational History   Occupation: Social research officer, government: [employer]  Tobacco Use   Smoking status: Former    Years: 20.00    Types: Cigarettes    Quit date: 10/24/1998    Years since quitting: 23.1   Smokeless tobacco: Never   Tobacco comments:    1 cig daily  Vaping Use   Vaping Use: Never used  Substance and Sexual Activity   Alcohol use: Not Currently    Comment: "no alcohol in years"   Drug use: No   Sexual activity: Never    Birth control/protection: Post-menopausal  Other Topics Concern   Not on file  Social History Narrative   Right handed   One story with a basement   Drinks caffeine   Social Determinants of Health   Financial Resource Strain: Not on file  Food Insecurity: Not on file  Transportation Needs: Not on file  Physical Activity: Not on file  Stress: Not on file  Social Connections: Not on file    Subjective: Review of Systems  Constitutional:  Positive for malaise/fatigue. Negative for chills and fever.  HENT:  Negative for congestion and hearing loss.   Eyes:  Negative for blurred vision  and double vision.  Respiratory:  Negative for cough and shortness of breath.   Cardiovascular:  Negative for chest pain and palpitations.  Gastrointestinal:  Positive for heartburn. Negative for abdominal pain, blood in stool, constipation, diarrhea, melena and vomiting.  Genitourinary:  Negative  for dysuria and urgency.  Musculoskeletal:  Negative for joint pain and myalgias.  Skin:  Negative for itching and rash.  Neurological:  Negative for dizziness and headaches.  Psychiatric/Behavioral:  Positive for memory loss. Negative for depression. The patient is not nervous/anxious.      Objective: BP 119/70 (BP Location: Left Arm, Patient Position: Sitting, Cuff Size: Large)   Pulse (!) 111   Temp 98 F (36.7 C) (Oral)   Ht 5' 4"  (1.626 m)   Wt 285 lb 3.2 oz (129.4 kg)   LMP 07/11/2007 (Approximate)   BMI 48.95 kg/m  Physical Exam Constitutional:      Appearance: Normal appearance. She is obese.  HENT:     Head: Normocephalic and atraumatic.  Eyes:     Extraocular Movements: Extraocular movements intact.     Conjunctiva/sclera: Conjunctivae normal.  Cardiovascular:     Rate and Rhythm: Normal rate and regular rhythm.  Pulmonary:     Effort: Pulmonary effort is normal.     Breath sounds: Normal breath sounds.  Abdominal:     General: Bowel sounds are normal.     Palpations: Abdomen is soft.  Musculoskeletal:        General: No swelling. Normal range of motion.     Cervical back: Normal range of motion and neck supple.  Skin:    General: Skin is warm and dry.     Coloration: Skin is not jaundiced.  Neurological:     Mental Status: She is alert and oriented to person, place, and time.     Comments: Mild asterixis present  Psychiatric:        Mood and Affect: Mood normal.        Behavior: Behavior normal.      Assessment: *Cirrhosis-due to Yolanda [name] *Peptic encephalopathy-new decompensating event *Obesity *GERD-relatively well-controlled on omeprazole 20 mg daily, though having breakthrough night symptoms *Hypoalbuminemia  Plan: Patient cirrhosis due to Yolanda [name], now presenting with recent decompensating event of hepatic encephalopathy requiring inpatient admission.  Discussed hepatic encephalopathy in depth with patient today.  Continue on lactulose.  Titrate up  for 2-3 loose bowel movements daily.  I will send in Xifaxan to take twice daily.  Discussed possibility of patient's assistance.  Ultrasound 12/08/2021 without hepatoma though was a poor study.  Given recent decompensation, will pursue MRI to further evaluate.  aFP ordered today  MELD labs ordered today.  EGD 10/28/2019 showed no evidence of esophageal varices.  Given decompensating event, will need to repeat this.  Hold off for now.  Discuss further on follow-up visit.  Counseled on high-protein low-sodium diet  Continue omeprazole for chronic GERD.  Follow-up in 6 weeks.  12/21/2021 10:12 AM   Disclaimer: This note was dictated with voice recognition software. Similar sounding words can inadvertently be transcribed and may not be corrected upon review.

## 2021-12-21 NOTE — Telephone Encounter (Signed)
PA done on Cover My Meds for Xifaxan 550 mg tabs. Dx: N81.77.   Pt authorized 07-10-2021 and ends 07-09-2022.   Given to Manuela Schwartz to scan to chart  Pt advised through Cover My Meds Text

## 2021-12-26 ENCOUNTER — Telehealth: Payer: Self-pay

## 2021-12-26 NOTE — Telephone Encounter (Signed)
Pt made aware [facility] schedule own appts. They will call her to schedule they are already aware of order in. She voiced understanding

## 2021-12-26 NOTE — Telephone Encounter (Signed)
Pt called inquiring of her MRI and when it is scheduled for

## 2021-12-28 ENCOUNTER — Encounter (HOSPITAL_COMMUNITY): Payer: Self-pay | Admitting: Physical Therapy

## 2021-12-28 ENCOUNTER — Ambulatory Visit (HOSPITAL_COMMUNITY): Payer: Medicare Other | Attending: Family Medicine | Admitting: Physical Therapy

## 2021-12-28 DIAGNOSIS — R4182 Altered mental status, unspecified: Secondary | ICD-10-CM | POA: Diagnosis present

## 2021-12-28 DIAGNOSIS — M6281 Muscle weakness (generalized): Secondary | ICD-10-CM | POA: Diagnosis present

## 2021-12-28 NOTE — Therapy (Signed)
OUTPATIENT PHYSICAL THERAPY NEURO EVALUATION   Patient Name: Yolanda [name] MRN: [id number] DOB:1955-08-26, 66 y.o., female Today's Date: 12/28/2021   PCP: Edwinna Areola hall MD  REFERRING PROVIDER: Roxan Hockey, MD    PT End of Session - 12/28/21 1726     Visit Number 1    Number of Visits 12    Date for PT Re-Evaluation 02/08/22    Authorization Type Medicare A/ [employer]    Progress Note Due on Visit 10    PT Start Time 1645    PT Stop Time 1728    PT Time Calculation (min) 43 min    Equipment Utilized During Treatment Gait belt    Activity Tolerance Patient tolerated treatment well;Patient limited by fatigue    Behavior During Therapy [facility] for tasks assessed/performed             Past Medical History:  Diagnosis Date   Abnormal uterine bleeding    Anxiety    Arthritis    Calculus of kidney 07/09/2008   Qualifier: Diagnosis of  By: Kellie Simmering LPN, Almyra Free     Chronic abdominal pain    Chronic pain in left foot    Clotting disorder ([place])    Depression    Elevated liver function tests 2018   Endometriosis    Family history of adverse reaction to anesthesia    Fatty liver    Fibromyalgia    Gastritis    GERD 12/21/2009   Qualifier: Diagnosis of  By: Craige Cotta     History of kidney stones    Hypertension    stopped meds in Aug 2015   Hypothyroidism    IBS (irritable bowel syndrome)    Internal hemorrhoids    Kidney stone    Obesity, morbid ([place]) 09/11/2012   PE 07/09/2008   Qualifier: Diagnosis of  By: Kellie Simmering LPN, Almyra Free     PE (pulmonary embolism)    PONV (postoperative nausea and vomiting)    Sleep apnea    Thyroid disease    Vitamin D deficiency    Past Surgical History:  Procedure Laterality Date   ABDOMINAL SURGERY     laparoscopy   BIOPSY N/A 03/24/2014   Procedure: GASTRIC BIOPSIES;  Surgeon: Danie Binder, MD;  Location: [facility];  Service: Endoscopy;  Laterality: N/A;   BIOPSY  10/28/2019   Procedure: BIOPSY;  Surgeon: Danie Binder, MD;  Location: [facility];  Service: Endoscopy;;  gastric nodule   BREAST LUMPECTOMY Right    CESAREAN SECTION     X2   COLONOSCOPY  2006   internal hemorrhoids   COLONOSCOPY WITH PROPOFOL N/A 03/24/2014   Dr. [name]: 2 tubular adenomas removed, hemorrhoids   COLONOSCOPY WITH PROPOFOL N/A 10/28/2019   Fields: External and internal hemorrhoids, 8 polyps ranging from 2 to 5 mm in size removed from the colon.  Multiple tubular adenomas.  Next colonoscopy in 3 years.   CYSTOSCOPY W/ RETROGRADES  01/23/2012   Procedure: CYSTOSCOPY WITH RETROGRADE PYELOGRAM;  Surgeon: Marissa Nestle, MD;  Location: [facility];  Service: Urology;  Laterality: Left;   DILATATION & CURETTAGE/HYSTEROSCOPY WITH MYOSURE N/A 11/18/2014   Procedure: DILATATION & CURETTAGE/HYSTEROSCOPY WITH MYOSURE, resection of polyp;  Surgeon: Cheri Fowler, MD;  Location: [facility];  Service: Gynecology;  Laterality: N/A;   DILATION AND CURETTAGE OF UTERUS     ESOPHAGOGASTRODUODENOSCOPY   08/24/2006   Dr. Veto Kemps erythema of the antrum without erosion or ulcers/Otherwise, normal esophagus without evidence of Barrett's  path with chronic gastritis   ESOPHAGOGASTRODUODENOSCOPY (EGD) WITH PROPOFOL N/A 03/24/2014   Dr. [name]: gastritis   ESOPHAGOGASTRODUODENOSCOPY (EGD) WITH PROPOFOL N/A 10/28/2019   Fields: Esophagus appeared normal, empiric dilation due to history of dysphagia.  6 mm nodule seen in the gastric cardia.  Mild portal hypertensive gastropathy.  Nodule from the stomach biopsied and showed mild chronic gastritis, no H. pylori.   POLYPECTOMY N/A 03/24/2014   Procedure: POLYPECTOMY;  Surgeon: Danie Binder, MD;  Location: [facility];  Service: Endoscopy;  Laterality: N/A;   POLYPECTOMY  10/28/2019   Procedure: POLYPECTOMY;  Surgeon: Danie Binder, MD;  [facility];  Service: Endoscopy;;  hepatic flexure, ascending colon,sigmoid colon, rectal   REMOVAL OF STONES  01/23/2012   Procedure: REMOVAL OF STONES;  Surgeon:  Marissa Nestle, MD;  Location: [facility];  Service: Urology;  Laterality: N/A;   SAVORY DILATION N/A 10/28/2019   Procedure: SAVORY DILATION;  Surgeon: Danie Binder, MD;  Location: [facility];  Service: Endoscopy;  Laterality: N/A;   TUBAL LIGATION     Patient Active Problem List   Diagnosis Date Noted   Seizure ([place]) 12/07/2021   Generalized weakness 12/07/2021   Hypoalbuminemia due to protein-calorie malnutrition ([place]) 12/07/2021   Hyperammonemia /Hepatic encephalopathy 12/07/2021   UTI (urinary tract infection) 12/07/2021   Elevated MCV 12/07/2021   Essential tremor 12/07/2021   Psoriatic arthritis ([place]) 12/07/2021   Asthma, chronic 12/07/2021   Acute hepatic encephalopathy/nNASH Liver Cirrhosis 12/07/2021   Chronic respiratory failure with hypoxia ([place]) 11/27/2021   Pulmonary infiltrates 11/27/2021   Upper airway cough syndrome vs cough variant asthma 11/27/2021   Pulmonary alveolar hemorrhage 09/20/2021   Multifocal pneumonia 09/20/2021   Hyperglycemia 09/20/2021   Oral candida 01/30/2020   Gastric nodule    Personal history of colonic polyps 01/01/2019   Liver cirrhosis secondary to NASH ([place]) 01/22/2018   Dysphagia 04/19/2017   Abdominal pain, epigastric 04/19/2017   Abdominal pain 12/08/2014   Postmenopausal bleeding 11/18/2014   Closed fracture of distal clavicle 04/17/2014   Muscle weakness (generalized) 03/26/2014   Pain in joint, shoulder region 03/26/2014   Decreased range of motion of right shoulder 03/26/2014   Right clavicle fracture 03/19/2014   Fatty liver 03/17/2014   Rectal bleeding 03/17/2014   Dyspepsia 03/11/2014   Morbid obesity with BMI of 50.0-59.9, adult ([place]) 09/11/2012   Hypothyroidism 09/11/2012   ADHD (attention deficit hyperactivity disorder) 09/11/2012   Anxiety 09/11/2012   GERD 12/21/2009   IRRITABLE BOWEL SYNDROME 04/22/2009   Mixed hyperlipidemia 07/09/2008   DEPRESSION 07/09/2008   Essential hypertension 07/09/2008   Gastritis  and gastroduodenitis 07/09/2008   Sleep apnea 07/09/2008    ONSET DATE: Chronic   REFERRING DIAG: R41.82 (ICD-10-CM) - Altered mental status, unspecified altered mental status type   THERAPY DIAG:  Altered mental status, unspecified altered mental status type  Rationale for Evaluation and Treatment Rehabilitation  SUBJECTIVE:                     SUBJECTIVE STATEMENT: Patient presents to therapy with complaint of progressive weakness, gait disturbance and frequent falls. She describes recently hospitilization for bout of weakness and dizziness from which she was referred to therapy. She describes several other medical issues that are contributing to symptoms including liver disease, hepatic encephalopathy and vertigo.  Pt accompanied by: friend Engineer, manufacturing systems)   PERTINENT HISTORY: liver disease,hepatic encephalopathy, arthritis, vertigo, fibro, confusion   PAIN:  Are you having pain? Yes: NPRS scale: 8/10 Pain location: "all over"  Pain description: burning, sharp, jabbing, constant Aggravating factors: "touching me"  Relieving factors: rest, heating pad, sitting   PRECAUTIONS: Fall  WEIGHT BEARING RESTRICTIONS No  FALLS: Has patient fallen in last 6 months? Yes. Number of falls 2  LIVING ENVIRONMENT: Lives with: lives with their family and lives with their daughter Lives in: House/apartment Stairs: Yes: External: 2 steps; none Has following equipment at home: Single point cane and Walker - 2 wheeled  PLOF: Needs assistance with ADLs  PATIENT GOALS Be able to walk farther, without AD  OBJECTIVE:   DIAGNOSTIC FINDINGS: NA  COGNITION: Overall cognitive status: Within functional limits for tasks assessed    LOWER EXTREMITY MMT:    MMT Right Eval Left Eval  Hip flexion 3- 3-  Hip extension 3+ 3+  Hip  abduction 4- 3+  Hip adduction    Hip internal rotation    Hip external rotation    Knee flexion    Knee extension 4 4  Ankle dorsiflexion 4 4-  Ankle plantarflexion    Ankle inversion    Ankle eversion    (Blank rows = not tested)  BED MOBILITY:  Sit to supine SBA Supine to sit SBA Rolling to Right Modified independence Rolling to Left Modified independence  TRANSFERS: Assistive device utilized: Environmental consultant - 2 wheeled  Sit to stand: CGA Stand to sit: SBA Chair to chair: CGA  GAIT: Gait pattern: decreased step length- Right and decreased step length- Left Distance walked: 140 feet  Assistive device utilized: Walker - 2 wheeled Level of assistance: SBA Comments: 2 MWT  FUNCTIONAL TESTs:  5 times sit to stand:  3 reps in 30 seconds with UEs 2 minute walk test: 140 feet with RW, decreased stride, increased fatigue    TODAY'S TREATMENT:  12/28/21 Eval Seated march LAQ   Seated HR/TR    PATIENT EDUCATION: Education details: on Eval findings, POC and HEP Person educated: Patient and Child(ren) Education method: Explanation Education comprehension: verbalized understanding   HOME EXERCISE PROGRAM: Access Code: [email or web address] URL: [email or web address] Date: 12/28/2021 Prepared by: Josue Hector  Exercises - Seated March  - 2-3 x daily - 7 x weekly - 2 sets - 10 reps - Seated Long Arc Quad  - 2-3 x daily - 7 x weekly - 2 sets - 10 reps - Seated Heel Toe Raises  - 2-3 x daily - 7 x weekly - 2 sets - 10 reps    GOALS: SHORT TERM GOALS: Target date: 01/18/2022  Patient will be independent with initial HEP and self-management strategies to improve functional outcomes Baseline:  Goal status: INITIAL    LONG TERM GOALS: Target date: 02/08/2022  Patient will be independent with advanced HEP and self-management strategies to improve functional outcomes Baseline:  Goal status: INITIAL  2.  Patient will be able to ambulate at least 250 feet during 2MWT  with LRAD to demonstrate improved ability to perform functional mobility and associated tasks. Baseline: 140 feet with rollator Goal status: INITIAL  3.  Patient will be able to perform stand x 5 in < 15 seconds to demonstrate improvement in functional mobility and reduced risk for falls Baseline: 3 reps in 30 seconds with UEs Goal status: INITIAL  4. Patient will have equal to or > 4/5 MMT throughout BLE to improve ability to perform functional  mobility, stair ambulation and ADLs.  Baseline: See MMT  Goal status: INITIAL  ASSESSMENT:  CLINICAL IMPRESSION: Patient is a 66 y.o. female who presents to physical therapy with complaint of weakness and balance issues. Patient demonstrates decreased strength, balance deficits and gait abnormalities which are negatively impacting patient ability to perform ADLs and functional mobility tasks. Patient will benefit from skilled physical therapy services to address these deficits to improve level of function with ADLs, functional mobility tasks, and reduce risk for falls.     OBJECTIVE IMPAIRMENTS Abnormal gait, decreased activity tolerance, decreased balance, decreased endurance, decreased mobility, difficulty walking, decreased strength, and pain.   ACTIVITY LIMITATIONS standing, squatting, stairs, transfers, bed mobility, and locomotion level  PARTICIPATION LIMITATIONS: meal prep, cleaning, laundry, shopping, and community activity  PERSONAL FACTORS Past/current experiences and 3+ comorbidities: See history above  are also affecting patient's functional outcome.   REHAB POTENTIAL: Fair See above   CLINICAL DECISION MAKING: Stable/uncomplicated  EVALUATION COMPLEXITY: Low  PLAN: PT FREQUENCY: 2x/week  PT DURATION: 6 weeks  PLANNED INTERVENTIONS: Therapeutic exercises, Therapeutic activity, Neuromuscular re-education, Balance training, Gait training, Patient/Family education, Joint manipulation, Joint mobilization, Stair training, Aquatic  Therapy, Dry Needling, Electrical stimulation, Spinal manipulation, Spinal mobilization, Cryotherapy, Moist heat, scar mobilization, Taping, Traction, Ultrasound, Biofeedback, Ionotophoresis 63m/ml Dexamethasone, and Manual therapy.   PLAN FOR NEXT SESSION: Progress functional strength, gait and balance as tolerated.   5:27 PM, 12/28/21 CJosue HectorPT DPT  Physical Therapist with [facility] ([phone]

## 2021-12-29 ENCOUNTER — Encounter: Payer: Self-pay | Admitting: Internal Medicine

## 2021-12-29 ENCOUNTER — Ambulatory Visit (INDEPENDENT_AMBULATORY_CARE_PROVIDER_SITE_OTHER): Payer: Medicare Other | Admitting: Internal Medicine

## 2021-12-29 DIAGNOSIS — R058 Other specified cough: Secondary | ICD-10-CM

## 2021-12-29 DIAGNOSIS — J9611 Chronic respiratory failure with hypoxia: Secondary | ICD-10-CM

## 2021-12-29 DIAGNOSIS — R3915 Urgency of urination: Secondary | ICD-10-CM

## 2021-12-29 DIAGNOSIS — R918 Other nonspecific abnormal finding of lung field: Secondary | ICD-10-CM | POA: Diagnosis not present

## 2021-12-29 NOTE — Progress Notes (Unsigned)
Yolanda [name], female    DOB: 01/14/1956,   MRN: [id number]   Brief patient profile:  48  yowf  minimal smoker  referred to pulmonary clinic in [place]  11/25/2021  s/p admit   Admit date: 09/20/2021 Discharge date: 09/22/2021    Brief Hospitalization Summary: Please see all hospital notes, images, labs for full details of the hospitalization. Yolanda [name] is a 66 y.o. female with medical history significant of hepatic cirrhosis, psoriatic arthritis, rheumatoid arthritis on Remicade per rheumatology morbid obesity, GERD who presentedto the emergency department due to cough with bloodstained sputum/clots which she noted one day PTA.  Patient states that she was having some cough for about 2 weeks and she went to her PCP who started her on prednisone and a cough medicine (did not remember the name), patient states that the prednisone helped, but symptoms returned after completing the prednisone pack.  She started to notice blood in sputum about a week PTA so she was referred to the ED for "lung scan".  At that time, she was provided with breathing treatment and IV hydration and she was discharged home with Tessalon 100 mg capsule and Medrol dosepak. ONe day PTA patient noted some blood and clots after having a cough fit, so she returned to the ED for further evaluation and management.  She endorsed prior history of PE during which she was on anticoagulation, however,  not on any anticoagulation prior to admit.  She denies fever, chills, headache, blurry vision, chest pain, nausea, vomiting, abdominal pain.   ED course: In the emergency department, she was hemodynamically stable, however, oxygen drops to 88% on ambulation.  Work-up in the ED shows normal CBC except for mild leukocytosis, normal BMP except for hyperglycemia, BNP 46, troponin x2 was negative.  Influenza A, B, SARS coronavirus 2 were negative. CT angiography chest with contrast showed no evidence of significant pulmonary  embolus.  Patchy airspace disease demonstrated in both lungs, consider multifocal pneumonia versus alveolar hemorrhage. She was treated with IV Solu-Medrol 125 mg x 1, breathing treatment was provided.  Hospitalist was asked to admit patient for further evaluation and management.   09/22/2021:  Pt feeling much better, breathing better, blood sugars much better controlled.   HHPT recommended.  Ambulatory referral to outpatient diabetes and nutrition made.  Follow up with PCP.    Hospital course by problem list   Assessment and Plan: * Pulmonary alveolar hemorrhage It is unknown at this time the cause of patient's blood in sputum/blood clot.  It may be due to her persistent cough. Considering her hypoxia on ambulation, consulted with pulmonology.  She is doing better now and no recurrence.  She is clinically much improved.        Multifocal pneumonia CT of chest was suggestive of multifocal pneumonia Patient will be empirically started on IV ceftriaxone and doxycycline and clinically much improved.  Continue Tylenol as needed Continue Mucinex, incentive spirometry, flutter valve        Hypothyroidism Continue Synthroid   Morbid obesity ([place]) BMI 52.74; patient requires diet and lifestyle modification Patient will follow-up with outpatient PCP for weight loss program Ambulatory referral to outpatient diabetes and nutrition center   GERD Continue Protonix   Essential hypertension Uncontrolled: Continue Benicar  Pulmonary inpt eva  09/20/21  1) CAP with acute mild hypoxemic resp failure - 3/14  PCT < 0.10  - 3/14  Strep pneumo neg - 3/14 Legionella Ag  >>>neg  >>> agree with empiric rx  2) epistaxis/ hemoptysis in setting of cirrhosis/ transient thrombocyctopenia      >>>  INR  1.3  no heparin >>>   ANA neg, Anti GBM neg and ANCA titers neg  esr 69 >>>   Sinus CT neg  REC u/a not done   3) Obesity with reduced ERV on last pft and no indication of  copd  PFT's  02/23/16   nl flows p am symbicort  ERV 20%  at wt 262   - could still have Asthma though not clinically evident on exam today     History of Present Illness  11/25/2021  Pulmonary/ 1st office eval/ Yolanda [name] / [name]  psoriatic/rheumatoid  arthritis on remicade  Chief Complaint  Patient presents with   Consult    Was seen in [place] at [place] by Yolanda [name] for pneumonia   Took allergy shots in her 66s to 30's seemed to help but still needed albuterol in both hfa and neb form since 50s at wt around 240 and use went way up around dec 2022    Dyspnea:  25 ft  Cough: severe but no more epistaxis or hemoptysis  Sleep: 45 degrees  SABA use: once on day of ov > 6 h prior to OV   Rec Depomedrol 120 mg IM today  Plan A = Automatic = Always=    Symbicort 80 Take 2 puffs first thing in am and then another 2 puffs about 12 hours later.   Work on Doctor, hospital - remember how a golfer warms up  Plan B = Backup (to supplement plan A, not to replace it) Only use your albuterol inhaler as a rescue medication Plan C = Crisis (instead of Plan B but only if Plan B stops working) - only use your albuterol nebulizer if you first try Plan B  The goal for 02 is to keep you above 90% at all times  Omeprazole 20 mg x 2  x 30 min before your first and last meal  GERD diet reviewed, bed blocks rec  Take delsym two tsp (mucinex dm is ok too) every 12 hours and supplement if needed with  tramadol 50 mg up to 1-2 every 4 hours to suppress the urge to cough. Swallowing water and/or using ice chips/non mint and menthol containing candies (such as lifesavers or sugarless jolly ranchers) are also effective.      12/29/2021  f/u ov/[place] office/Yolanda [name] re: sob  maint on ***  Chief Complaint  Patient presents with   Follow-up    Breathing has worsened since last ov.  88% on room air. From lobby to room     Dyspnea:  says now sob at rest whether use 02 or  Cough: resolved  Sleeping: flat bed  L side one pillow  SABA use:  hfa and neb for flares  02: none / on cpap per Yolanda [name] p 7 hours helps some  At rest 2lpm  Covid status: x 2    No obvious day to day or daytime variability or assoc excess/ purulent sputum or mucus plugs or hemoptysis or cp or chest tightness, subjective wheeze or overt sinus or hb symptoms.   Sleeping as above  without nocturnal  or early am exacerbation  of respiratory  c/o's or need for noct saba. Also denies any obvious fluctuation of symptoms with weather or environmental changes or other aggravating or alleviating factors except as outlined above   No unusual exposure hx or h/o childhood pna/ asthma or knowledge  of premature birth.  Current Allergies, Complete Past Medical History, Past Surgical History, Family History, and Social History were reviewed in [employer] record.  ROS  The following are not active complaints unless bolded Hoarseness, sore throat, dysphagia, dental problems, itching, sneezing,  nasal congestion or discharge of excess mucus or purulent secretions, ear ache,   fever, chills, sweats, unintended wt loss or wt gain, classically pleuritic or exertional cp,  orthopnea pnd or arm/hand swelling  or leg swelling, presyncope, palpitations, abdominal pain, anorexia, nausea, vomiting, diarrhea  or change in bowel habits or change in bladder habits, change in stools or change in urine, dysuria, hematuria,  rash, arthralgias, visual complaints, headache, numbness, weakness or ataxia or problems with walking using rollator or coordination,  change in mood or  memory.        Current Meds  Medication Sig   albuterol (VENTOLIN HFA) 108 (90 Base) MCG/ACT inhaler Inhale 1-2 puffs into the lungs every 6 (six) hours as needed for wheezing or shortness of breath.   bisacodyl (BISACODYL) 5 MG EC tablet Take 10 mg by mouth daily as needed.   blood glucose meter kit and supplies Dispense based on patient and insurance preference. Use up to four times daily as  directed. (FOR ICD-10 E10.9, E11.9).   budesonide-formoterol (SYMBICORT) 80-4.5 MCG/ACT inhaler Take 2 puffs first thing in am and then another 2 puffs about 12 hours later.   Cholecalciferol (VITAMIN D3) 2000 units TABS Take 2,000 Units by mouth daily.    CRANBERRY PO Take 500 mg by mouth daily.   DULoxetine (CYMBALTA) 60 MG capsule Take 60 mg by mouth daily.   inFLIXimab (REMICADE IV) Inject into the vein. Q three months per patient.   lactulose, encephalopathy, (CHRONULAC) 10 GM/15ML SOLN Take 30 mLs (20 g total) by mouth daily. Ok to take additional dose if No BM in 24 hrs (Patient taking differently: Take 20 g by mouth daily. Ok to take additional dose if No BM in 24 hrs)   Levothyroxine Sodium 100 MCG CAPS Take 75 mcg by mouth daily before breakfast.   meclizine (ANTIVERT) 25 MG tablet Take 25 mg by mouth 3 (three) times daily as needed for dizziness.   Melatonin 10 MG CAPS Take 10 mg by mouth at bedtime as needed (sleep).   metFORMIN (GLUCOPHAGE-XR) 500 MG 24 hr tablet 1 po with supper x 1 week, then 1 po BID WC x 1 week, then 2 po BID WC (Patient taking differently: Take 500 mg by mouth 2 (two) times daily.)   olmesartan-hydrochlorothiazide (BENICAR HCT) 40-12.5 MG tablet Take 1 tablet by mouth daily.   omeprazole (PRILOSEC) 20 MG capsule Take 1 capsule (20 mg total) by mouth 2 (two) times daily before a meal. 1 po 30 mins prior to [place]. (Patient taking differently: Take 20 mg by mouth 2 (two) times daily as needed (gerd).)   rosuvastatin (CRESTOR) 10 MG tablet Take 10 mg by mouth daily.             Past Medical History:  Diagnosis Date   Abnormal uterine bleeding    Anxiety    Arthritis    Calculus of kidney 07/09/2008   Qualifier: Diagnosis of  By: Kellie Simmering LPN, Almyra Free     Chronic abdominal pain    Chronic pain in left foot    Clotting disorder ([place])    Depression    Elevated liver function tests 2018   Endometriosis    Family history of adverse reaction  to anesthesia     Fatty liver    Fibromyalgia    Gastritis    GERD 12/21/2009   Qualifier: Diagnosis of  By: Craige Cotta     History of kidney stones    Hypertension    stopped meds in Aug 2015   Hypothyroidism    IBS (irritable bowel syndrome)    Internal hemorrhoids    Kidney stone    Obesity, morbid ([place]) 09/11/2012   PE 07/09/2008   Qualifier: Diagnosis of  By: Kellie Simmering LPN, Almyra Free     PE (pulmonary embolism)    PONV (postoperative nausea and vomiting)    Sleep apnea    Thyroid disease    Vitamin D deficiency        Objective:     Wt Readings from Last 3 Encounters:  12/29/21 285 lb (129.3 kg)  12/21/21 285 lb 3.2 oz (129.4 kg)  12/12/21 286 lb (129.7 kg)      Vital signs reviewed  12/29/2021  - Note at rest 02 sats  88% on RA p 100 ft walk to room   General appearance:    obese hoarse wf using rollator      HEENT : Oropharynx  ***     Nasal turbinates ***   NECK :  without  appent JVD/ palpable Nodes/TM    LUNGS: no acc muscle use,  Nl contour chest which is clear to A and P bilaterally without cough on insp or exp maneuvers   CV:  RRR  no s3 or murmur or increase in P2, and no edema   ABD: quite  soft and nontender    MS:  Nl gait/ ext warm without deformities Or obvious joint restrictions  calf tenderness, cyanosis or clubbing    SKIN: warm and dry without lesions    NEURO:  alert, approp, nl sensorium with  no motor or cerebellar deficits apparent.     CXR PA and Lateral:   12/29/2021 :    I personally reviewed images and agree with radiology impression as follows:    Did not go for cxr as rec       Assessment

## 2021-12-29 NOTE — Patient Instructions (Addendum)
Make sure you check your oxygen saturation  AT  your highest level of activity (not after you stop)   to be sure it stays over 90% and adjust  02 flow upward to maintain this level if needed but remember to turn it back to previous settings when you stop (to conserve your supply).   Plan A = Automatic = Always=  Symbicort 80 Take 2 puffs first thing in am and then another 2 puffs about 12 hours later.     Work on inhaler technique:  relax and gently blow all the way out then take a nice smooth full deep breath back in, triggering the inhaler at same time you start breathing in.  Hold for up to 5 seconds if you can. Blow out thru nose. Rinse and gargle with water when done.  If mouth or throat bother you at all,  try brushing teeth/gums/tongue with arm and hammer toothpaste/ make a slurry and gargle and spit out.       Plan B = Backup (to supplement plan A, not to replace it) Only use your albuterol inhaler as a rescue medication to be used if you can't catch your breath by resting or doing a relaxed purse lip breathing pattern.  - The less you use it, the better it will work when you need it. - Ok to use the inhaler up to 2 puffs  every 4 hours if you must but call for appointment if use goes up over your usual need - Don't leave home without it !!  (think of it like the spare tire for your car)   Plan C = Crisis (instead of Plan B but only if Plan B stops working) - only use your albuterol nebulizer if you first try Plan B and it fails to help > ok to use the nebulizer up to every 4 hours but if start needing it regularly call for immediate appointment  Please remember to go to the lab department   for your tests - we will call you with the results when they are available.      Please remember to go to the  x-ray department  @  [facility] for your tests - we will call you with the results when they are available     Sleep medicine next available

## 2021-12-31 ENCOUNTER — Telehealth: Payer: Self-pay | Admitting: Internal Medicine

## 2021-12-31 ENCOUNTER — Encounter: Payer: Self-pay | Admitting: Internal Medicine

## 2022-01-02 ENCOUNTER — Ambulatory Visit (HOSPITAL_COMMUNITY)
Admission: RE | Admit: 2022-01-02 | Discharge: 2022-01-02 | Disposition: A | Discharge status: Home / Self Care | Payer: Medicare Other | Source: Ambulatory Visit | Attending: Internal Medicine | Admitting: Internal Medicine

## 2022-01-02 DIAGNOSIS — R918 Other nonspecific abnormal finding of lung field: Secondary | ICD-10-CM | POA: Insufficient documentation

## 2022-01-02 NOTE — Telephone Encounter (Signed)
Mychart message sent.

## 2022-01-03 ENCOUNTER — Encounter (HOSPITAL_COMMUNITY): Payer: Self-pay | Admitting: Physical Therapy

## 2022-01-03 ENCOUNTER — Ambulatory Visit (HOSPITAL_COMMUNITY): Payer: Medicare Other | Admitting: Physical Therapy

## 2022-01-03 DIAGNOSIS — R4182 Altered mental status, unspecified: Secondary | ICD-10-CM

## 2022-01-03 DIAGNOSIS — M6281 Muscle weakness (generalized): Secondary | ICD-10-CM | POA: Diagnosis not present

## 2022-01-03 LAB — URINALYSIS
Bilirubin, UA: NEGATIVE
Glucose, UA: NEGATIVE
Nitrite, UA: NEGATIVE
RBC, UA: NEGATIVE
Specific Gravity, UA: 1.022 (ref 1.005–1.030)
Urobilinogen, Ur: 1 mg/dL (ref 0.2–1.0)
pH, UA: 6 (ref 5.0–7.5)

## 2022-01-03 LAB — BASIC METABOLIC PANEL
BUN/Creatinine Ratio: 11 — ABNORMAL LOW (ref 12–28)
BUN: 9 mg/dL (ref 8–27)
CO2: 28 mmol/L (ref 20–29)
Calcium: 8.3 mg/dL — ABNORMAL LOW (ref 8.7–10.3)
Chloride: 100 mmol/L (ref 96–106)
Creatinine, Ser: 0.82 mg/dL (ref 0.57–1.00)
Glucose: 101 mg/dL — ABNORMAL HIGH (ref 70–99)
Potassium: 3.6 mmol/L (ref 3.5–5.2)
Sodium: 137 mmol/L (ref 134–144)
eGFR: 78 mL/min/{1.73_m2} (ref >59)

## 2022-01-03 LAB — CBC WITH DIFFERENTIAL/PLATELET
Basophils Absolute: 0.1 10*3/uL (ref 0.0–0.2)
Basos: 1 %
EOS (ABSOLUTE): 0.2 10*3/uL (ref 0.0–0.4)
Eos: 2 %
Hematocrit: 38.9 % (ref 34.0–46.6)
Hemoglobin: 13.2 g/dL (ref 11.1–15.9)
Immature Grans (Abs): 0 10*3/uL (ref 0.0–0.1)
Immature Granulocytes: 0 %
Lymphocytes Absolute: 1.8 10*3/uL (ref 0.7–3.1)
Lymphs: 20 %
MCH: 32.8 pg (ref 26.6–33.0)
MCHC: 33.9 g/dL (ref 31.5–35.7)
MCV: 97 fL (ref 79–97)
Monocytes Absolute: 0.9 10*3/uL (ref 0.1–0.9)
Monocytes: 10 %
Neutrophils Absolute: 5.9 10*3/uL (ref 1.4–7.0)
Neutrophils: 67 %
Platelets: 156 10*3/uL (ref 150–450)
RBC: 4.03 x10E6/uL (ref 3.77–5.28)
RDW: 12.8 % (ref 11.7–15.4)
WBC: 8.8 10*3/uL (ref 3.4–10.8)

## 2022-01-03 LAB — SEDIMENTATION RATE: Sed Rate: 28 mm/hr (ref 0–40)

## 2022-01-05 ENCOUNTER — Encounter (HOSPITAL_COMMUNITY): Payer: Self-pay

## 2022-01-05 ENCOUNTER — Ambulatory Visit (HOSPITAL_COMMUNITY): Payer: Medicare Other

## 2022-01-05 DIAGNOSIS — M6281 Muscle weakness (generalized): Secondary | ICD-10-CM | POA: Diagnosis not present

## 2022-01-05 DIAGNOSIS — R4182 Altered mental status, unspecified: Secondary | ICD-10-CM

## 2022-01-05 NOTE — Therapy (Signed)
OUTPATIENT PHYSICAL THERAPY TREATMENT NOTE   Patient Name: Yolanda [name] MRN: [id number] DOB:October 31, 1955, 66 y.o., female Today's Date: 01/05/2022  PCP: Celene Squibb MD REFERRING PROVIDER: Roxan Hockey, MD   END OF SESSION:   PT End of Session - 01/05/22 1433     Visit Number 3    Number of Visits 12    Date for PT Re-Evaluation 02/08/22    Authorization Type Medicare A/ [employer]    Progress Note Due on Visit 10    PT Start Time 1434    PT Stop Time 1515    PT Time Calculation (min) 41 min    Equipment Utilized During Treatment Gait belt    Activity Tolerance Patient tolerated treatment well;Patient limited by fatigue    Behavior During Therapy [facility] for tasks assessed/performed             Past Medical History:  Diagnosis Date   Abnormal uterine bleeding    Anxiety    Arthritis    Calculus of kidney 07/09/2008   Qualifier: Diagnosis of  By: Kellie Simmering LPN, Almyra Free     Chronic abdominal pain    Chronic pain in left foot    Clotting disorder ([place])    Depression    Elevated liver function tests 2018   Endometriosis    Family history of adverse reaction to anesthesia    Fatty liver    Fibromyalgia    Gastritis    GERD 12/21/2009   Qualifier: Diagnosis of  By: Craige Cotta     History of kidney stones    Hypertension    stopped meds in Aug 2015   Hypothyroidism    IBS (irritable bowel syndrome)    Internal hemorrhoids    Kidney stone    Obesity, morbid ([place]) 09/11/2012   PE 07/09/2008   Qualifier: Diagnosis of  By: Kellie Simmering LPN, Almyra Free     PE (pulmonary embolism)    PONV (postoperative nausea and vomiting)    Sleep apnea    Thyroid disease    Vitamin D deficiency    Past Surgical History:  Procedure Laterality Date   ABDOMINAL SURGERY     laparoscopy   BIOPSY N/A 03/24/2014   Procedure: GASTRIC BIOPSIES;  Surgeon: Danie Binder, MD;  Location: [facility];  Service: Endoscopy;  Laterality: N/A;   BIOPSY  10/28/2019   Procedure: BIOPSY;  Surgeon:  Danie Binder, MD;  Location: [facility];  Service: Endoscopy;;  gastric nodule   BREAST LUMPECTOMY Right    CESAREAN SECTION     X2   COLONOSCOPY  2006   internal hemorrhoids   COLONOSCOPY WITH PROPOFOL N/A 03/24/2014   Dr. [name]: 2 tubular adenomas removed, hemorrhoids   COLONOSCOPY WITH PROPOFOL N/A 10/28/2019   Fields: External and internal hemorrhoids, 8 polyps ranging from 2 to 5 mm in size removed from the colon.  Multiple tubular adenomas.  Next colonoscopy in 3 years.   CYSTOSCOPY W/ RETROGRADES  01/23/2012   Procedure: CYSTOSCOPY WITH RETROGRADE PYELOGRAM;  Surgeon: Marissa Nestle, MD;  Location: [facility];  Service: Urology;  Laterality: Left;   DILATATION & CURETTAGE/HYSTEROSCOPY WITH MYOSURE N/A 11/18/2014   Procedure: DILATATION & CURETTAGE/HYSTEROSCOPY WITH MYOSURE, resection of polyp;  Surgeon: Cheri Fowler, MD;  Location: [facility];  Service: Gynecology;  Laterality: N/A;   DILATION AND CURETTAGE OF UTERUS     ESOPHAGOGASTRODUODENOSCOPY   08/24/2006   Dr. Veto Kemps erythema of the antrum without erosion or ulcers/Otherwise, normal esophagus without evidence  of Barrett's path with chronic gastritis   ESOPHAGOGASTRODUODENOSCOPY (EGD) WITH PROPOFOL N/A 03/24/2014   Dr. [name]: gastritis   ESOPHAGOGASTRODUODENOSCOPY (EGD) WITH PROPOFOL N/A 10/28/2019   Fields: Esophagus appeared normal, empiric dilation due to history of dysphagia.  6 mm nodule seen in the gastric cardia.  Mild portal hypertensive gastropathy.  Nodule from the stomach biopsied and showed mild chronic gastritis, no H. pylori.   POLYPECTOMY N/A 03/24/2014   Procedure: POLYPECTOMY;  Surgeon: Danie Binder, MD;  Location: [facility];  Service: Endoscopy;  Laterality: N/A;   POLYPECTOMY  10/28/2019   Procedure: POLYPECTOMY;  Surgeon: Danie Binder, MD;  [facility];  Service: Endoscopy;;  hepatic flexure, ascending colon,sigmoid colon, rectal   REMOVAL OF STONES  01/23/2012   Procedure: REMOVAL OF  STONES;  Surgeon: Marissa Nestle, MD;  Location: [facility];  Service: Urology;  Laterality: N/A;   SAVORY DILATION N/A 10/28/2019   Procedure: SAVORY DILATION;  Surgeon: Danie Binder, MD;  [facility];  Service: Endoscopy;  Laterality: N/A;   TUBAL LIGATION     Patient Active Problem List   Diagnosis Date Noted   Urinary urgency 12/29/2021   Seizure ([place]) 12/07/2021   Generalized weakness 12/07/2021   Hypoalbuminemia due to protein-calorie malnutrition ([place]) 12/07/2021   Hyperammonemia /Hepatic encephalopathy 12/07/2021   UTI (urinary tract infection) 12/07/2021   Elevated MCV 12/07/2021   Essential tremor 12/07/2021   Psoriatic arthritis ([place]) 12/07/2021   Asthma, chronic 12/07/2021   Acute hepatic encephalopathy/nNASH Liver Cirrhosis 12/07/2021   Chronic respiratory failure with hypoxia ([place]) 11/27/2021   Pulmonary infiltrates 11/27/2021   Upper airway cough syndrome vs cough variant asthma 11/27/2021   Pulmonary alveolar hemorrhage 09/20/2021   Multifocal pneumonia 09/20/2021   Hyperglycemia 09/20/2021   Oral candida 01/30/2020   Gastric nodule    Personal history of colonic polyps 01/01/2019   Liver cirrhosis secondary to NASH ([place]) 01/22/2018   Dysphagia 04/19/2017   Abdominal pain, epigastric 04/19/2017   Abdominal pain 12/08/2014   Postmenopausal bleeding 11/18/2014   Closed fracture of distal clavicle 04/17/2014   Muscle weakness (generalized) 03/26/2014   Pain in joint, shoulder region 03/26/2014   Decreased range of motion of right shoulder 03/26/2014   Right clavicle fracture 03/19/2014   Fatty liver 03/17/2014   Rectal bleeding 03/17/2014   Dyspepsia 03/11/2014   Morbid obesity with BMI of 50.0-59.9, adult ([place]) 09/11/2012   Hypothyroidism 09/11/2012   ADHD (attention deficit hyperactivity disorder) 09/11/2012   Anxiety 09/11/2012   GERD 12/21/2009   IRRITABLE BOWEL SYNDROME 04/22/2009   Mixed hyperlipidemia 07/09/2008   DEPRESSION 07/09/2008    Essential hypertension 07/09/2008   Gastritis and gastroduodenitis 07/09/2008   Sleep apnea 07/09/2008    REFERRING DIAG: R41.82 (ICD-10-CM) - Altered mental status, unspecified altered mental status type   THERAPY DIAG:  Altered mental status, unspecified altered mental status type  Muscle weakness (generalized)  Rationale for Evaluation and Treatment Rehabilitation  PERTINENT HISTORY: liver disease,hepatic encephalopathy, arthritis, vertigo, fibro, confusion   PRECAUTIONS: Fall  SUBJECTIVE: Patient reports that she feels that she is getting stronger just from the few exercises she has done. Reports that she was walking in friends pool yesterday. Patient becomes dizzy upon standing from waiting room. Patient reports she had infusion yesterday for psoriatic arthritis. Pts goal to be able to walk longer is limited by her dizziness, shortness of breath, arthritis, pain in back and heavier weight.   PAIN:  Are you having pain? Yes: NPRS scale:  6/10 Pain location: both legs Pain description: deep ache Aggravating factors: walking Relieving factors: non WB   OBJECTIVE:    DIAGNOSTIC FINDINGS: NA   COGNITION: Overall cognitive status: Within functional limits for tasks assessed               LOWER EXTREMITY MMT:     MMT Right Eval Left Eval  Hip flexion 3- 3-  Hip extension 3+ 3+  Hip abduction 4- 3+  Hip adduction      Hip internal rotation      Hip external rotation      Knee flexion      Knee extension 4 4  Ankle dorsiflexion 4 4-  Ankle plantarflexion      Ankle inversion      Ankle eversion      (Blank rows = not tested)   BED MOBILITY:  Sit to supine SBA Supine to sit SBA Rolling to Right Modified independence Rolling to Left Modified independence   TRANSFERS: Assistive device utilized: Environmental consultant - 2 wheeled  Sit to stand: CGA Stand to sit: SBA Chair to chair: CGA   GAIT: Gait pattern: decreased step length- Right and decreased step length-  Left Distance walked: 140 feet  Assistive device utilized: Walker - 2 wheeled Level of assistance: SBA Comments: 2 MWT   FUNCTIONAL TESTs:  5 times sit to stand:  3 reps in 30 seconds with UEs 2 minute walk test: 140 feet with RW, decreased stride, increased fatigue      TODAY'S TREATMENT:   01/05/22   spO2 at 93   Steps up onto 4 inch box x10 reps  Lateral steps onto 4 inch box x5 reps each  x2 sets  spO2 drops to 83 Standing marches in // bar x 2laps  Lateral steps with big leg lift x2 laps  spO2 at 97 Retro steps with big hip extensions x2 laps  Seated physioball forward rolls x20  Seated physioball lateral rolls x20 b/l  Seated march 2 x 10 Sit to stand from elevated mat 5x2   spO2 98   01/03/22 Goals Review Seated march 2 x 10 Seated HR/ TR 2 x 10 LAQ 2 x 10    Standing: Heel raise 2 x10 Toe raise 2 x 10 Standing hip abduction 2 x 10 each Step taps on 4 inch box 3 x 10 Staggered stance 3 x 30"    12/28/21 Eval Seated march LAQ             Seated HR/TR      PATIENT EDUCATION: Education details: on Eval findings, POC and HEP Person educated: Patient and Child(ren) Education method: Explanation Education comprehension: verbalized understanding     HOME EXERCISE PROGRAM: Access Code: [email or web address] URL: [email or web address]  01/03/22 - Standing Heel Raise with Support  - 2 x daily - 7 x weekly - 2 sets - 10 reps - Standing Toe Raises at Chair  - 2 x daily - 7 x weekly - 2 sets - 10 reps - Semi-Tandem Balance at [employer] Eyes Open  - 2 x daily - 7 x weekly - 1 sets - 3 reps - 30 second hold  Date: 12/28/2021 Prepared by: Josue Hector   Exercises - Seated March  - 2-3 x daily - 7 x weekly - 2 sets - 10 reps - Seated Long Arc Quad  - 2-3 x daily - 7 x weekly - 2 sets - 10 reps - Seated Heel Toe Raises  - 2-3 x daily -  7 x weekly - 2 sets - 10 reps       GOALS: SHORT TERM GOALS: Target date: 01/18/2022   Patient will be independent  with initial HEP and self-management strategies to improve functional outcomes Baseline:  Goal status: INITIAL      LONG TERM GOALS: Target date: 02/08/2022   Patient will be independent with advanced HEP and self-management strategies to improve functional outcomes Baseline:  Goal status: INITIAL   2.  Patient will be able to ambulate at least 250 feet during 2MWT with LRAD to demonstrate improved ability to perform functional mobility and associated tasks. Baseline: 140 feet with rollator Goal status: INITIAL   3.  Patient will be able to perform stand x 5 in < 15 seconds to demonstrate improvement in functional mobility and reduced risk for falls Baseline: 3 reps in 30 seconds with UEs Goal status: INITIAL   4. Patient will have equal to or > 4/5 MMT throughout BLE to improve ability to perform functional mobility, stair ambulation and ADLs.  Baseline: See MMT  Goal status: INITIAL   ASSESSMENT:   CLINICAL IMPRESSION: Patient tolerated session well today but was limited by fatigue. Patient does require frequent rest breaks due to increased fatigue. Patient does not present with her O2 today. Advised pt to please bring in her prescribed O2.  Progressed to more upright walking and leg exercises. Added in seated physioball rolls for back stretching. Pt reports rolls feels good on back.  Patient will continue to benefit from skilled therapy services to reduce remaining deficits and improve functional ability.       OBJECTIVE IMPAIRMENTS Abnormal gait, decreased activity tolerance, decreased balance, decreased endurance, decreased mobility, difficulty walking, decreased strength, and pain.    ACTIVITY LIMITATIONS standing, squatting, stairs, transfers, bed mobility, and locomotion level   PARTICIPATION LIMITATIONS: meal prep, cleaning, laundry, shopping, and community activity   PERSONAL FACTORS Past/current experiences and 3+ comorbidities: See history above  are also affecting  patient's functional outcome.    REHAB POTENTIAL: Fair See above    CLINICAL DECISION MAKING: Stable/uncomplicated   EVALUATION COMPLEXITY: Low   PLAN: PT FREQUENCY: 2x/week   PT DURATION: 6 weeks   PLANNED INTERVENTIONS: Therapeutic exercises, Therapeutic activity, Neuromuscular re-education, Balance training, Gait training, Patient/Family education, Joint manipulation, Joint mobilization, Stair training, Aquatic Therapy, Dry Needling, Electrical stimulation, Spinal manipulation, Spinal mobilization, Cryotherapy, Moist heat, scar mobilization, Taping, Traction, Ultrasound, Biofeedback, Ionotophoresis 33m/ml Dexamethasone, and Manual therapy.     PLAN FOR NEXT SESSION: Progress functional strength, gait and balance as tolerated.      3:16 PM, 01/05/22 Shaarav Ripple PT, DPT

## 2022-01-07 ENCOUNTER — Ambulatory Visit
Admission: RE | Admit: 2022-01-07 | Discharge: 2022-01-07 | Disposition: A | Discharge status: Home / Self Care | Payer: Medicare Other | Source: Ambulatory Visit | Attending: Internal Medicine | Admitting: Internal Medicine

## 2022-01-07 DIAGNOSIS — K7460 Unspecified cirrhosis of liver: Secondary | ICD-10-CM

## 2022-01-07 MED ORDER — GADOBENATE DIMEGLUMINE 529 MG/ML IV SOLN
20.0000 mL | Freq: Once | INTRAVENOUS | Status: AC | PRN
  Administered 2022-01-07: 20 mL via INTRAVENOUS

## 2022-01-11 ENCOUNTER — Telehealth: Payer: Self-pay

## 2022-01-11 NOTE — Telephone Encounter (Signed)
Pt's medication list as of July 5,2023  Name Dose, Frequency Refills                Outpatient and Clinic-Administered Medications   rosuvastatin (CRESTOR) 10 MG tablet 10 mg, Daily        omeprazole (PRILOSEC) 20 MG capsule 20 mg, 2 times daily before meals 3 ordered       Patient taking differently: 20 mg Oral 2 times daily PRN, gerd, (No instructions reported), Reason: Free Text Sig Edit, Informant: Self, Reported on 12/06/2021     olmesartan-hydrochlorothiazide (BENICAR HCT) 40-12.5 MG tablet 1 tablet, Daily 3 ordered       metFORMIN (GLUCOPHAGE-XR) 500 MG 24 hr tablet  1 ordered       Patient taking differently: 500 mg Oral 2 times daily, (No instructions reported), Reason: Patient Preference, Informant: Self, Reported on 12/06/2021     Melatonin 10 MG CAPS 10 mg, At bedtime PRN        meclizine (ANTIVERT) 25 MG tablet 25 mg, 3 times daily PRN        Levothyroxine Sodium 100 MCG CAPS 75 mcg, Daily before breakfast        lactulose, encephalopathy, (CHRONULAC) 10 GM/15ML SOLN 20 g, Daily 1 ordered       Patient taking differently: 20 g Oral Daily, Ok to take additional dose if No BM in 24 hrs , Indications: Hepatic Encephalopathy, Reported on 12/21/2021     inFLIXimab (REMICADE IV)         DULoxetine (CYMBALTA) 60 MG capsule 60 mg, Daily        CRANBERRY PO 500 mg, Daily        Cholecalciferol (VITAMIN D3) 2000 units TABS 2,000 Units, Daily        budesonide-formoterol (SYMBICORT) 80-4.5 MCG/ACT inhaler  12 ordered       blood glucose meter kit and supplies  0 ordered       bisacodyl (BISACODYL) 5 MG EC tablet 10 mg, Daily PRN        albuterol (VENTOLIN HFA) 108 (90 Base) MCG/ACT          

## 2022-01-11 NOTE — Telephone Encounter (Signed)
Pt was mailed her part of forms for help with her medication cost. Instructions were included.  Will fax  once I have all that is needed

## 2022-01-19 ENCOUNTER — Encounter (HOSPITAL_COMMUNITY): Payer: Medicare Other | Admitting: Physical Therapy

## 2022-01-24 ENCOUNTER — Encounter (HOSPITAL_COMMUNITY): Payer: Self-pay

## 2022-01-24 ENCOUNTER — Encounter: Payer: Self-pay | Admitting: Internal Medicine

## 2022-01-24 ENCOUNTER — Ambulatory Visit (HOSPITAL_COMMUNITY): Payer: Medicare Other | Attending: Family Medicine

## 2022-01-24 DIAGNOSIS — M6281 Muscle weakness (generalized): Secondary | ICD-10-CM | POA: Insufficient documentation

## 2022-01-24 DIAGNOSIS — R4182 Altered mental status, unspecified: Secondary | ICD-10-CM | POA: Diagnosis not present

## 2022-01-24 NOTE — Therapy (Signed)
OUTPATIENT PHYSICAL THERAPY TREATMENT NOTE   Patient Name: Yolanda [name] MRN: [id number] DOB:[birth year], 66 y.o., female Today's Date: 01/24/2022  PCP: Celene Squibb MD REFERRING PROVIDER: Roxan Hockey, MD   END OF SESSION:   PT End of Session - 01/24/22 1122     Visit Number 4    Number of Visits 12    Date for PT Re-Evaluation 02/08/22    Authorization Type Medicare A/ [employer]    Progress Note Due on Visit 10    PT Start Time 1122    PT Stop Time 1200    PT Time Calculation (min) 38 min    Equipment Utilized During Treatment Gait belt    Activity Tolerance Patient tolerated treatment well;Patient limited by fatigue    Behavior During Therapy [facility] for tasks assessed/performed             Past Medical History:  Diagnosis Date   Abnormal uterine bleeding    Anxiety    Arthritis    Calculus of kidney 07/09/2008   Qualifier: Diagnosis of  By: Kellie Simmering LPN, Almyra Free     Chronic abdominal pain    Chronic pain in left foot    Clotting disorder ([place])    Depression    Elevated liver function tests 2018   Endometriosis    Family history of adverse reaction to anesthesia    Fatty liver    Fibromyalgia    Gastritis    GERD 12/21/2009   Qualifier: Diagnosis of  By: Craige Cotta     History of kidney stones    Hypertension    stopped meds in Aug 2015   Hypothyroidism    IBS (irritable bowel syndrome)    Internal hemorrhoids    Kidney stone    Obesity, morbid ([place]) 09/11/2012   PE 07/09/2008   Qualifier: Diagnosis of  By: Kellie Simmering LPN, Almyra Free     PE (pulmonary embolism)    PONV (postoperative nausea and vomiting)    Sleep apnea    Thyroid disease    Vitamin D deficiency    Past Surgical History:  Procedure Laterality Date   ABDOMINAL SURGERY     laparoscopy   BIOPSY N/A 03/24/2014   Procedure: GASTRIC BIOPSIES;  Surgeon: Danie Binder, MD;  Location: [facility];  Service: Endoscopy;  Laterality: N/A;   BIOPSY  10/28/2019   Procedure: BIOPSY;  Surgeon:  Danie Binder, MD;  Location: [facility];  Service: Endoscopy;;  gastric nodule   BREAST LUMPECTOMY Right    CESAREAN SECTION     X2   COLONOSCOPY  2006   internal hemorrhoids   COLONOSCOPY WITH PROPOFOL N/A 03/24/2014   Dr. [name]: 2 tubular adenomas removed, hemorrhoids   COLONOSCOPY WITH PROPOFOL N/A 10/28/2019   Fields: External and internal hemorrhoids, 8 polyps ranging from 2 to 5 mm in size removed from the colon.  Multiple tubular adenomas.  Next colonoscopy in 3 years.   CYSTOSCOPY W/ RETROGRADES  01/23/2012   Procedure: CYSTOSCOPY WITH RETROGRADE PYELOGRAM;  Surgeon: Marissa Nestle, MD;  Location: [facility];  Service: Urology;  Laterality: Left;   DILATATION & CURETTAGE/HYSTEROSCOPY WITH MYOSURE N/A 11/18/2014   Procedure: DILATATION & CURETTAGE/HYSTEROSCOPY WITH MYOSURE, resection of polyp;  Surgeon: Cheri Fowler, MD;  Location: [facility];  Service: Gynecology;  Laterality: N/A;   DILATION AND CURETTAGE OF UTERUS     ESOPHAGOGASTRODUODENOSCOPY   08/24/2006   Dr. Veto Kemps erythema of the antrum without erosion or ulcers/Otherwise, normal esophagus without evidence  of Barrett's path with chronic gastritis   ESOPHAGOGASTRODUODENOSCOPY (EGD) WITH PROPOFOL N/A 03/24/2014   Dr. [name]: gastritis   ESOPHAGOGASTRODUODENOSCOPY (EGD) WITH PROPOFOL N/A 10/28/2019   Fields: Esophagus appeared normal, empiric dilation due to history of dysphagia.  6 mm nodule seen in the gastric cardia.  Mild portal hypertensive gastropathy.  Nodule from the stomach biopsied and showed mild chronic gastritis, no H. pylori.   POLYPECTOMY N/A 03/24/2014   Procedure: POLYPECTOMY;  Surgeon: Danie Binder, MD;  Location: [facility];  Service: Endoscopy;  Laterality: N/A;   POLYPECTOMY  10/28/2019   Procedure: POLYPECTOMY;  Surgeon: Danie Binder, MD;  [facility];  Service: Endoscopy;;  hepatic flexure, ascending colon,sigmoid colon, rectal   REMOVAL OF STONES  01/23/2012   Procedure: REMOVAL OF  STONES;  Surgeon: Marissa Nestle, MD;  Location: [facility];  Service: Urology;  Laterality: N/A;   SAVORY DILATION N/A 10/28/2019   Procedure: SAVORY DILATION;  Surgeon: Danie Binder, MD;  [facility];  Service: Endoscopy;  Laterality: N/A;   TUBAL LIGATION     Patient Active Problem List   Diagnosis Date Noted   Urinary urgency 12/29/2021   Seizure ([place]) 12/07/2021   Generalized weakness 12/07/2021   Hypoalbuminemia due to protein-calorie malnutrition ([place]) 12/07/2021   Hyperammonemia /Hepatic encephalopathy 12/07/2021   UTI (urinary tract infection) 12/07/2021   Elevated MCV 12/07/2021   Essential tremor 12/07/2021   Psoriatic arthritis ([place]) 12/07/2021   Asthma, chronic 12/07/2021   Acute hepatic encephalopathy/nNASH Liver Cirrhosis 12/07/2021   Chronic respiratory failure with hypoxia ([place]) 11/27/2021   Pulmonary infiltrates 11/27/2021   Upper airway cough syndrome vs cough variant asthma 11/27/2021   Pulmonary alveolar hemorrhage 09/20/2021   Multifocal pneumonia 09/20/2021   Hyperglycemia 09/20/2021   Oral candida 01/30/2020   Gastric nodule    Personal history of colonic polyps 01/01/2019   Liver cirrhosis secondary to NASH ([place]) 01/22/2018   Dysphagia 04/19/2017   Abdominal pain, epigastric 04/19/2017   Abdominal pain 12/08/2014   Postmenopausal bleeding 11/18/2014   Closed fracture of distal clavicle 04/17/2014   Muscle weakness (generalized) 03/26/2014   Pain in joint, shoulder region 03/26/2014   Decreased range of motion of right shoulder 03/26/2014   Right clavicle fracture 03/19/2014   Fatty liver 03/17/2014   Rectal bleeding 03/17/2014   Dyspepsia 03/11/2014   Morbid obesity with BMI of 50.0-59.9, adult ([place]) 09/11/2012   Hypothyroidism 09/11/2012   ADHD (attention deficit hyperactivity disorder) 09/11/2012   Anxiety 09/11/2012   GERD 12/21/2009   IRRITABLE BOWEL SYNDROME 04/22/2009   Mixed hyperlipidemia 07/09/2008   DEPRESSION 07/09/2008    Essential hypertension 07/09/2008   Gastritis and gastroduodenitis 07/09/2008   Sleep apnea 07/09/2008    REFERRING DIAG: R41.82 (ICD-10-CM) - Altered mental status, unspecified altered mental status type   THERAPY DIAG:  Altered mental status, unspecified altered mental status type  Muscle weakness (generalized)  Rationale for Evaluation and Treatment Rehabilitation  PERTINENT HISTORY: liver disease,hepatic encephalopathy, arthritis, vertigo, fibro, confusion   PRECAUTIONS: Fall  SUBJECTIVE: patient reports she is in 6/10 pain. Pt arrives with rollator.ready for PT. Pt reports that she had fall down stairs at daughter homes. Reports that she has some left rib pain no bruising in that rea. Pt reports bruising on lower back.  Pts goal to be able to walk longer is limited by her dizziness, shortness of breath, arthritis, pain in back and heavier weight.   PAIN:  Are you having pain? Yes: NPRS scale:  6/10 Pain location: both legs Pain description: deep ache Aggravating factors: walking Relieving factors: non WB   OBJECTIVE:    DIAGNOSTIC FINDINGS: NA   COGNITION: Overall cognitive status: Within functional limits for tasks assessed               LOWER EXTREMITY MMT:     MMT Right Eval Left Eval  Hip flexion 3- 3-  Hip extension 3+ 3+  Hip abduction 4- 3+  Hip adduction      Hip internal rotation      Hip external rotation      Knee flexion      Knee extension 4 4  Ankle dorsiflexion 4 4-  Ankle plantarflexion      Ankle inversion      Ankle eversion      (Blank rows = not tested)   BED MOBILITY:  Sit to supine SBA Supine to sit SBA Rolling to Right Modified independence Rolling to Left Modified independence   TRANSFERS: Assistive device utilized: Environmental consultant - 2 wheeled  Sit to stand: CGA Stand to sit: SBA Chair to chair: CGA   GAIT: Gait pattern: decreased step length- Right and decreased step length- Left Distance walked: 140 feet  Assistive device  utilized: Walker - 2 wheeled Level of assistance: SBA Comments: 2 MWT   FUNCTIONAL TESTs:  5 times sit to stand:  3 reps in 30 seconds with UEs 2 minute walk test: 140 feet with RW, decreased stride, increased fatigue      TODAY'S TREATMENT:  01/24/22 6 inch step up anterior x10 b/l with b/l Ue support Lateral step overs 6 inch step x10 Anterior step down off 4 inch step x10 b/l  Standing march with 2 finger hold x20 reps  Nustep level 3 uni hill x5 mins Ambulating in clinic with Rolator 50'x3    01/05/22   spO2 at 93   Steps up onto 4 inch box x10 reps  Lateral steps onto 4 inch box x5 reps each  x2 sets  spO2 drops to 83 Standing marches in // bar x 2laps  Lateral steps with big leg lift x2 laps  spO2 at 97 Retro steps with big hip extensions x2 laps  Seated physioball forward rolls x20  Seated physioball lateral rolls x20 b/l  Seated march 2 x 10 Sit to stand from elevated mat 5x2   spO2 98   01/03/22 Goals Review Seated march 2 x 10 Seated HR/ TR 2 x 10 LAQ 2 x 10    Standing: Heel raise 2 x10 Toe raise 2 x 10 Standing hip abduction 2 x 10 each Step taps on 4 inch box 3 x 10 Staggered stance 3 x 30"    12/28/21 Eval Seated march LAQ             Seated HR/TR      PATIENT EDUCATION: Education details: on Eval findings, POC and HEP Person educated: Patient and Child(ren) Education method: Explanation Education comprehension: verbalized understanding     HOME EXERCISE PROGRAM: Access Code: [email or web address] URL: [email or web address]  01/03/22 - Standing Heel Raise with Support  - 2 x daily - 7 x weekly - 2 sets - 10 reps - Standing Toe Raises at Chair  - 2 x daily - 7 x weekly - 2 sets - 10 reps - Semi-Tandem Balance at [employer] Eyes Open  - 2 x daily - 7 x weekly - 1 sets - 3 reps - 30 second hold  Date: 12/28/2021 Prepared by:  Josue Hector   Exercises - Seated March  - 2-3 x daily - 7 x weekly - 2 sets - 10 reps - Seated Long Arc  Quad  - 2-3 x daily - 7 x weekly - 2 sets - 10 reps - Seated Heel Toe Raises  - 2-3 x daily - 7 x weekly - 2 sets - 10 reps       GOALS: SHORT TERM GOALS: Target date: 01/18/2022   Patient will be independent with initial HEP and self-management strategies to improve functional outcomes Baseline:  Goal status: INITIAL      LONG TERM GOALS: Target date: 02/08/2022   Patient will be independent with advanced HEP and self-management strategies to improve functional outcomes Baseline:  Goal status: INITIAL   2.  Patient will be able to ambulate at least 250 feet during 2MWT with LRAD to demonstrate improved ability to perform functional mobility and associated tasks. Baseline: 140 feet with rollator Goal status: INITIAL   3.  Patient will be able to perform stand x 5 in < 15 seconds to demonstrate improvement in functional mobility and reduced risk for falls Baseline: 3 reps in 30 seconds with UEs Goal status: INITIAL   4. Patient will have equal to or > 4/5 MMT throughout BLE to improve ability to perform functional mobility, stair ambulation and ADLs.  Baseline: See MMT  Goal status: INITIAL   ASSESSMENT:   CLINICAL IMPRESSION: Patient tolerated session fair, pt was limited by fatigue and pain in left ribs. Patient reports that MD was aware of fall and was assessed for rib pain.  Patient does require frequent rest breaks due to increased fatigue. Patient does not present with her O2 today. Advised pt to please bring in her prescribed O2. Asked pt to please wear sneakers to next session. Pt with increased fear of falling in all exercises.  Patient will continue to benefit from skilled therapy services to reduce remaining deficits and improve functional ability.       OBJECTIVE IMPAIRMENTS Abnormal gait, decreased activity tolerance, decreased balance, decreased endurance, decreased mobility, difficulty walking, decreased strength, and pain.    ACTIVITY LIMITATIONS standing,  squatting, stairs, transfers, bed mobility, and locomotion level   PARTICIPATION LIMITATIONS: meal prep, cleaning, laundry, shopping, and community activity   PERSONAL FACTORS Past/current experiences and 3+ comorbidities: See history above  are also affecting patient's functional outcome.    REHAB POTENTIAL: Fair See above    CLINICAL DECISION MAKING: Stable/uncomplicated   EVALUATION COMPLEXITY: Low   PLAN: PT FREQUENCY: 2x/week   PT DURATION: 6 weeks   PLANNED INTERVENTIONS: Therapeutic exercises, Therapeutic activity, Neuromuscular re-education, Balance training, Gait training, Patient/Family education, Joint manipulation, Joint mobilization, Stair training, Aquatic Therapy, Dry Needling, Electrical stimulation, Spinal manipulation, Spinal mobilization, Cryotherapy, Moist heat, scar mobilization, Taping, Traction, Ultrasound, Biofeedback, Ionotophoresis 44m/ml Dexamethasone, and Manual therapy.     PLAN FOR NEXT SESSION: Progress functional strength, gait and balance as tolerated.      11:23 AM, 01/24/22 Verlin Uher PT, DPT

## 2022-01-26 NOTE — Telephone Encounter (Signed)
Phoned the pt and was advised by her she did receive the paperwork but was unclear was she suppose to fax just one or both incomes. Advised pt total income for the household need to be sent. Pt states she will get it to me.

## 2022-01-26 NOTE — Telephone Encounter (Signed)
Documentation from [facility] advising pt needs to send her income, and other personal information. Waiting on the pt to send me this information because it was mailed out to her. Will follow up with the pt.

## 2022-01-26 NOTE — Telephone Encounter (Signed)
Received labs from [facility].  MELD 8, low, stable.  AFP WNL.  Repeat in 6 months.  Follow-up with GI as previously scheduled.

## 2022-01-27 ENCOUNTER — Encounter (HOSPITAL_COMMUNITY): Payer: Self-pay

## 2022-01-27 ENCOUNTER — Ambulatory Visit (HOSPITAL_COMMUNITY): Payer: Medicare Other

## 2022-01-27 DIAGNOSIS — M6281 Muscle weakness (generalized): Secondary | ICD-10-CM

## 2022-01-27 DIAGNOSIS — R4182 Altered mental status, unspecified: Secondary | ICD-10-CM | POA: Diagnosis not present

## 2022-01-27 NOTE — Therapy (Signed)
OUTPATIENT PHYSICAL THERAPY TREATMENT NOTE   Patient Name: Yolanda [name] MRN: [id number] DOB:04-16-1956, 66 y.o., female Today's Date: 01/27/2022  PCP: Celene Squibb MD REFERRING PROVIDER: Roxan Hockey, MD   END OF SESSION:   PT End of Session - 01/27/22 1036     Visit Number 5    Number of Visits 12    Date for PT Re-Evaluation 02/08/22    Authorization Type Medicare A/ [employer]    Progress Note Due on Visit 10    PT Start Time 1036    PT Stop Time 1115    PT Time Calculation (min) 39 min    Equipment Utilized During Treatment Gait belt    Activity Tolerance Patient tolerated treatment well;Patient limited by fatigue    Behavior During Therapy [facility] for tasks assessed/performed             Past Medical History:  Diagnosis Date   Abnormal uterine bleeding    Anxiety    Arthritis    Calculus of kidney 07/09/2008   Qualifier: Diagnosis of  By: Kellie Simmering LPN, Almyra Free     Chronic abdominal pain    Chronic pain in left foot    Clotting disorder ([place])    Depression    Elevated liver function tests 2018   Endometriosis    Family history of adverse reaction to anesthesia    Fatty liver    Fibromyalgia    Gastritis    GERD 12/21/2009   Qualifier: Diagnosis of  By: Craige Cotta     History of kidney stones    Hypertension    stopped meds in Aug 2015   Hypothyroidism    IBS (irritable bowel syndrome)    Internal hemorrhoids    Kidney stone    Obesity, morbid ([place]) 09/11/2012   PE 07/09/2008   Qualifier: Diagnosis of  By: Kellie Simmering LPN, Almyra Free     PE (pulmonary embolism)    PONV (postoperative nausea and vomiting)    Sleep apnea    Thyroid disease    Vitamin D deficiency    Past Surgical History:  Procedure Laterality Date   ABDOMINAL SURGERY     laparoscopy   BIOPSY N/A 03/24/2014   Procedure: GASTRIC BIOPSIES;  Surgeon: Danie Binder, MD;  Location: [facility];  Service: Endoscopy;  Laterality: N/A;   BIOPSY  10/28/2019   Procedure: BIOPSY;  Surgeon:  Danie Binder, MD;  Location: [facility];  Service: Endoscopy;;  gastric nodule   BREAST LUMPECTOMY Right    CESAREAN SECTION     X2   COLONOSCOPY  2006   internal hemorrhoids   COLONOSCOPY WITH PROPOFOL N/A 03/24/2014   Dr. [name]: 2 tubular adenomas removed, hemorrhoids   COLONOSCOPY WITH PROPOFOL N/A 10/28/2019   Fields: External and internal hemorrhoids, 8 polyps ranging from 2 to 5 mm in size removed from the colon.  Multiple tubular adenomas.  Next colonoscopy in 3 years.   CYSTOSCOPY W/ RETROGRADES  01/23/2012   Procedure: CYSTOSCOPY WITH RETROGRADE PYELOGRAM;  Surgeon: Marissa Nestle, MD;  Location: [facility];  Service: Urology;  Laterality: Left;   DILATATION & CURETTAGE/HYSTEROSCOPY WITH MYOSURE N/A 11/18/2014   Procedure: DILATATION & CURETTAGE/HYSTEROSCOPY WITH MYOSURE, resection of polyp;  Surgeon: Cheri Fowler, MD;  Location: [facility];  Service: Gynecology;  Laterality: N/A;   DILATION AND CURETTAGE OF UTERUS     ESOPHAGOGASTRODUODENOSCOPY   08/24/2006   Dr. Veto Kemps erythema of the antrum without erosion or ulcers/Otherwise, normal esophagus without evidence  of Barrett's path with chronic gastritis   ESOPHAGOGASTRODUODENOSCOPY (EGD) WITH PROPOFOL N/A 03/24/2014   Dr. [name]: gastritis   ESOPHAGOGASTRODUODENOSCOPY (EGD) WITH PROPOFOL N/A 10/28/2019   Fields: Esophagus appeared normal, empiric dilation due to history of dysphagia.  6 mm nodule seen in the gastric cardia.  Mild portal hypertensive gastropathy.  Nodule from the stomach biopsied and showed mild chronic gastritis, no H. pylori.   POLYPECTOMY N/A 03/24/2014   Procedure: POLYPECTOMY;  Surgeon: Danie Binder, MD;  Location: [facility];  Service: Endoscopy;  Laterality: N/A;   POLYPECTOMY  10/28/2019   Procedure: POLYPECTOMY;  Surgeon: Danie Binder, MD;  [facility];  Service: Endoscopy;;  hepatic flexure, ascending colon,sigmoid colon, rectal   REMOVAL OF STONES  01/23/2012   Procedure: REMOVAL OF  STONES;  Surgeon: Marissa Nestle, MD;  Location: [facility];  Service: Urology;  Laterality: N/A;   SAVORY DILATION N/A 10/28/2019   Procedure: SAVORY DILATION;  Surgeon: Danie Binder, MD;  [facility];  Service: Endoscopy;  Laterality: N/A;   TUBAL LIGATION     Patient Active Problem List   Diagnosis Date Noted   Urinary urgency 12/29/2021   Seizure ([place]) 12/07/2021   Generalized weakness 12/07/2021   Hypoalbuminemia due to protein-calorie malnutrition ([place]) 12/07/2021   Hyperammonemia /Hepatic encephalopathy 12/07/2021   UTI (urinary tract infection) 12/07/2021   Elevated MCV 12/07/2021   Essential tremor 12/07/2021   Psoriatic arthritis ([place]) 12/07/2021   Asthma, chronic 12/07/2021   Acute hepatic encephalopathy/nNASH Liver Cirrhosis 12/07/2021   Chronic respiratory failure with hypoxia ([place]) 11/27/2021   Pulmonary infiltrates 11/27/2021   Upper airway cough syndrome vs cough variant asthma 11/27/2021   Pulmonary alveolar hemorrhage 09/20/2021   Multifocal pneumonia 09/20/2021   Hyperglycemia 09/20/2021   Oral candida 01/30/2020   Gastric nodule    Personal history of colonic polyps 01/01/2019   Liver cirrhosis secondary to NASH ([place]) 01/22/2018   Dysphagia 04/19/2017   Abdominal pain, epigastric 04/19/2017   Abdominal pain 12/08/2014   Postmenopausal bleeding 11/18/2014   Closed fracture of distal clavicle 04/17/2014   Muscle weakness (generalized) 03/26/2014   Pain in joint, shoulder region 03/26/2014   Decreased range of motion of right shoulder 03/26/2014   Right clavicle fracture 03/19/2014   Fatty liver 03/17/2014   Rectal bleeding 03/17/2014   Dyspepsia 03/11/2014   Morbid obesity with BMI of 50.0-59.9, adult ([place]) 09/11/2012   Hypothyroidism 09/11/2012   ADHD (attention deficit hyperactivity disorder) 09/11/2012   Anxiety 09/11/2012   GERD 12/21/2009   IRRITABLE BOWEL SYNDROME 04/22/2009   Mixed hyperlipidemia 07/09/2008   DEPRESSION 07/09/2008    Essential hypertension 07/09/2008   Gastritis and gastroduodenitis 07/09/2008   Sleep apnea 07/09/2008    REFERRING DIAG: R41.82 (ICD-10-CM) - Altered mental status, unspecified altered mental status type   THERAPY DIAG:  Altered mental status, unspecified altered mental status type  Muscle weakness (generalized)  Rationale for Evaluation and Treatment Rehabilitation  PERTINENT HISTORY: liver disease,hepatic encephalopathy, arthritis, vertigo, fibro, confusion   PRECAUTIONS: Fall  SUBJECTIVE: patient reports that right shoulder is bothering her this morning. Reported that pain started after last session, maybe from doing arms on nustep. Pt presents today with sneakers and with her O2 tank. Reports that she feels legs feel weak.   PAIN:  Are you having pain? Yes: NPRS scale: right shoulder 5/10 Pain location: both legs Pain description: deep ache Aggravating factors: walking Relieving factors: non WB   OBJECTIVE:    DIAGNOSTIC FINDINGS: NA  COGNITION: Overall cognitive status: Within functional limits for tasks assessed               LOWER EXTREMITY MMT:     MMT Right Eval Left Eval  Hip flexion 3- 3-  Hip extension 3+ 3+  Hip abduction 4- 3+  Hip adduction      Hip internal rotation      Hip external rotation      Knee flexion      Knee extension 4 4  Ankle dorsiflexion 4 4-  Ankle plantarflexion      Ankle inversion      Ankle eversion      (Blank rows = not tested)   BED MOBILITY:  Sit to supine SBA Supine to sit SBA Rolling to Right Modified independence Rolling to Left Modified independence   TRANSFERS: Assistive device utilized: Environmental consultant - 2 wheeled  Sit to stand: CGA Stand to sit: SBA Chair to chair: CGA   GAIT: Gait pattern: decreased step length- Right and decreased step length- Left Distance walked: 140 feet  Assistive device utilized: Walker - 2 wheeled Level of assistance: SBA Comments: 2 MWT   FUNCTIONAL TESTs:  5 times sit to  stand:  3 reps in 30 seconds with UEs 2 minute walk test: 140 feet with RW, decreased stride, increased fatigue      TODAY'S TREATMENT:  01/27/22 6 inch step up anterior x10 b/l with b/l Ue support Lateral step overs 6 inch step x10 Standing march with 2 finger hold x20 reps x3 sec hold for each Standing on foam, b/l UE lifts x10, trunk rotations x10  Side step and mini squat on inside of // bars x4 laps Heel raises x20 Slant board stretch 10 sec holds x5  Ue pulleys x2 mins into flexion All  at SBA   01/24/22 6 inch step up anterior x10 b/l with b/l Ue support Lateral step overs 6 inch step x10 Anterior step down off 4 inch step x10 b/l  Standing march with 2 finger hold x20 reps  Nustep level 3 uni hill x5 mins Ambulating in clinic with Rolator 50'x3    01/05/22   spO2 at 93   Steps up onto 4 inch box x10 reps  Lateral steps onto 4 inch box x5 reps each  x2 sets  spO2 drops to 83 Standing marches in // bar x 2laps  Lateral steps with big leg lift x2 laps  spO2 at 97 Retro steps with big hip extensions x2 laps  Seated physioball forward rolls x20  Seated physioball lateral rolls x20 b/l  Seated march 2 x 10 Sit to stand from elevated mat 5x2   spO2 98   01/03/22 Goals Review Seated march 2 x 10 Seated HR/ TR 2 x 10 LAQ 2 x 10    Standing: Heel raise 2 x10 Toe raise 2 x 10 Standing hip abduction 2 x 10 each Step taps on 4 inch box 3 x 10 Staggered stance 3 x 30"    12/28/21 Eval Seated march LAQ             Seated HR/TR      PATIENT EDUCATION: Education details: on Eval findings, POC and HEP Person educated: Patient and Child(ren) Education method: Explanation Education comprehension: verbalized understanding     HOME EXERCISE PROGRAM: Access Code: [email or web address] URL: [email or web address]  01/03/22 - Standing Heel Raise with Support  - 2 x daily - 7 x weekly - 2 sets - 10 reps - Standing  Toe Raises at Chair  - 2 x daily - 7 x weekly - 2  sets - 10 reps - Semi-Tandem Balance at [employer] Eyes Open  - 2 x daily - 7 x weekly - 1 sets - 3 reps - 30 second hold  Date: 12/28/2021 Prepared by: Josue Hector   Exercises - Seated March  - 2-3 x daily - 7 x weekly - 2 sets - 10 reps - Seated Long Arc Quad  - 2-3 x daily - 7 x weekly - 2 sets - 10 reps - Seated Heel Toe Raises  - 2-3 x daily - 7 x weekly - 2 sets - 10 reps       GOALS: SHORT TERM GOALS: Target date: 01/18/2022   Patient will be independent with initial HEP and self-management strategies to improve functional outcomes Baseline:  Goal status: INITIAL      LONG TERM GOALS: Target date: 02/08/2022   Patient will be independent with advanced HEP and self-management strategies to improve functional outcomes Baseline:  Goal status: INITIAL   2.  Patient will be able to ambulate at least 250 feet during 2MWT with LRAD to demonstrate improved ability to perform functional mobility and associated tasks. Baseline: 140 feet with rollator Goal status: INITIAL   3.  Patient will be able to perform stand x 5 in < 15 seconds to demonstrate improvement in functional mobility and reduced risk for falls Baseline: 3 reps in 30 seconds with UEs Goal status: INITIAL   4. Patient will have equal to or > 4/5 MMT throughout BLE to improve ability to perform functional mobility, stair ambulation and ADLs.  Baseline: See MMT  Goal status: INITIAL   ASSESSMENT:   CLINICAL IMPRESSION: Patient tolerates todays session better than last session. Pt able to perform more exercises and have less time for rest between secondary to having oxygen tank with her today. Pt reports some leg soreness post exercising. Pts right shoulder pain improved with seated UE pulleys. Pt continues to be limited in ambulation and transfers as well as overall decreased strength and muscular endurance. Patient will continue to benefit from skilled therapy services to reduce remaining deficits and improve  functional ability.       OBJECTIVE IMPAIRMENTS Abnormal gait, decreased activity tolerance, decreased balance, decreased endurance, decreased mobility, difficulty walking, decreased strength, and pain.    ACTIVITY LIMITATIONS standing, squatting, stairs, transfers, bed mobility, and locomotion level   PARTICIPATION LIMITATIONS: meal prep, cleaning, laundry, shopping, and community activity   PERSONAL FACTORS Past/current experiences and 3+ comorbidities: See history above  are also affecting patient's functional outcome.    REHAB POTENTIAL: Fair See above    CLINICAL DECISION MAKING: Stable/uncomplicated   EVALUATION COMPLEXITY: Low   PLAN: PT FREQUENCY: 2x/week   PT DURATION: 6 weeks   PLANNED INTERVENTIONS: Therapeutic exercises, Therapeutic activity, Neuromuscular re-education, Balance training, Gait training, Patient/Family education, Joint manipulation, Joint mobilization, Stair training, Aquatic Therapy, Dry Needling, Electrical stimulation, Spinal manipulation, Spinal mobilization, Cryotherapy, Moist heat, scar mobilization, Taping, Traction, Ultrasound, Biofeedback, Ionotophoresis 76m/ml Dexamethasone, and Manual therapy.     PLAN FOR NEXT SESSION: Progress functional strength, gait and balance as tolerated.      11:21 AM, 01/27/22 Kenlie Seki PT, DPT

## 2022-01-31 ENCOUNTER — Encounter (HOSPITAL_COMMUNITY): Payer: Medicare Other | Admitting: Physical Therapy

## 2022-02-03 ENCOUNTER — Ambulatory Visit (HOSPITAL_COMMUNITY): Payer: Medicare Other | Admitting: Physical Therapy

## 2022-02-03 ENCOUNTER — Encounter (HOSPITAL_COMMUNITY): Payer: Self-pay | Admitting: Physical Therapy

## 2022-02-03 DIAGNOSIS — M6281 Muscle weakness (generalized): Secondary | ICD-10-CM

## 2022-02-03 DIAGNOSIS — R4182 Altered mental status, unspecified: Secondary | ICD-10-CM | POA: Diagnosis not present

## 2022-02-03 NOTE — Therapy (Signed)
OUTPATIENT PHYSICAL THERAPY TREATMENT NOTE   Patient Name: Yolanda [name] MRN: [id number] DOB:Nov 13, 1955, 66 y.o., female Today's Date: 02/03/2022  PCP: Celene Squibb MD REFERRING PROVIDER: Roxan Hockey, MD   END OF SESSION:   PT End of Session - 02/03/22 1113     Visit Number 6    Number of Visits 12    Date for PT Re-Evaluation 02/08/22    Authorization Type Medicare A/ [employer]    Progress Note Due on Visit 10    PT Start Time 1115    PT Stop Time 1153    PT Time Calculation (min) 38 min    Equipment Utilized During Treatment Gait belt    Activity Tolerance Patient tolerated treatment well;Patient limited by fatigue    Behavior During Therapy [facility] for tasks assessed/performed             Past Medical History:  Diagnosis Date   Abnormal uterine bleeding    Anxiety    Arthritis    Calculus of kidney 07/09/2008   Qualifier: Diagnosis of  By: Kellie Simmering LPN, Almyra Free     Chronic abdominal pain    Chronic pain in left foot    Clotting disorder ([place])    Depression    Elevated liver function tests 2018   Endometriosis    Family history of adverse reaction to anesthesia    Fatty liver    Fibromyalgia    Gastritis    GERD 12/21/2009   Qualifier: Diagnosis of  By: Craige Cotta     History of kidney stones    Hypertension    stopped meds in Aug 2015   Hypothyroidism    IBS (irritable bowel syndrome)    Internal hemorrhoids    Kidney stone    Obesity, morbid ([place]) 09/11/2012   PE 07/09/2008   Qualifier: Diagnosis of  By: Kellie Simmering LPN, Almyra Free     PE (pulmonary embolism)    PONV (postoperative nausea and vomiting)    Sleep apnea    Thyroid disease    Vitamin D deficiency    Past Surgical History:  Procedure Laterality Date   ABDOMINAL SURGERY     laparoscopy   BIOPSY N/A 03/24/2014   Procedure: GASTRIC BIOPSIES;  Surgeon: Danie Binder, MD;  Location: [facility];  Service: Endoscopy;  Laterality: N/A;   BIOPSY  10/28/2019   Procedure: BIOPSY;  Surgeon:  Danie Binder, MD;  Location: [facility];  Service: Endoscopy;;  gastric nodule   BREAST LUMPECTOMY Right    CESAREAN SECTION     X2   COLONOSCOPY  2006   internal hemorrhoids   COLONOSCOPY WITH PROPOFOL N/A 03/24/2014   Dr. [name]: 2 tubular adenomas removed, hemorrhoids   COLONOSCOPY WITH PROPOFOL N/A 10/28/2019   Fields: External and internal hemorrhoids, 8 polyps ranging from 2 to 5 mm in size removed from the colon.  Multiple tubular adenomas.  Next colonoscopy in 3 years.   CYSTOSCOPY W/ RETROGRADES  01/23/2012   Procedure: CYSTOSCOPY WITH RETROGRADE PYELOGRAM;  Surgeon: Marissa Nestle, MD;  Location: [facility];  Service: Urology;  Laterality: Left;   DILATATION & CURETTAGE/HYSTEROSCOPY WITH MYOSURE N/A 11/18/2014   Procedure: DILATATION & CURETTAGE/HYSTEROSCOPY WITH MYOSURE, resection of polyp;  Surgeon: Cheri Fowler, MD;  Location: [place] [facility];  Service: Gynecology;  Laterality: N/A;   DILATION AND CURETTAGE OF UTERUS     ESOPHAGOGASTRODUODENOSCOPY   08/24/2006   Dr. Veto Kemps erythema of the antrum without erosion or ulcers/Otherwise, normal esophagus without evidence  of Barrett's path with chronic gastritis   ESOPHAGOGASTRODUODENOSCOPY (EGD) WITH PROPOFOL N/A 03/24/2014   Dr. [name]: gastritis   ESOPHAGOGASTRODUODENOSCOPY (EGD) WITH PROPOFOL N/A 10/28/2019   Fields: Esophagus appeared normal, empiric dilation due to history of dysphagia.  6 mm nodule seen in the gastric cardia.  Mild portal hypertensive gastropathy.  Nodule from the stomach biopsied and showed mild chronic gastritis, no H. pylori.   POLYPECTOMY N/A 03/24/2014   Procedure: POLYPECTOMY;  Surgeon: Danie Binder, MD;  Location: [facility];  Service: Endoscopy;  Laterality: N/A;   POLYPECTOMY  10/28/2019   Procedure: POLYPECTOMY;  Surgeon: Danie Binder, MD;  [facility];  Service: Endoscopy;;  hepatic flexure, ascending colon,sigmoid colon, rectal   REMOVAL OF STONES  01/23/2012   Procedure: REMOVAL OF  STONES;  Surgeon: Marissa Nestle, MD;  Location: [facility];  Service: Urology;  Laterality: N/A;   SAVORY DILATION N/A 10/28/2019   Procedure: SAVORY DILATION;  Surgeon: Danie Binder, MD;  [facility];  Service: Endoscopy;  Laterality: N/A;   TUBAL LIGATION     Patient Active Problem List   Diagnosis Date Noted   Urinary urgency 12/29/2021   Seizure ([place]) 12/07/2021   Generalized weakness 12/07/2021   Hypoalbuminemia due to protein-calorie malnutrition ([place]) 12/07/2021   Hyperammonemia /Hepatic encephalopathy 12/07/2021   UTI (urinary tract infection) 12/07/2021   Elevated MCV 12/07/2021   Essential tremor 12/07/2021   Psoriatic arthritis ([place]) 12/07/2021   Asthma, chronic 12/07/2021   Acute hepatic encephalopathy/nNASH Liver Cirrhosis 12/07/2021   Chronic respiratory failure with hypoxia ([place]) 11/27/2021   Pulmonary infiltrates 11/27/2021   Upper airway cough syndrome vs cough variant asthma 11/27/2021   Pulmonary alveolar hemorrhage 09/20/2021   Multifocal pneumonia 09/20/2021   Hyperglycemia 09/20/2021   Oral candida 01/30/2020   Gastric nodule    Personal history of colonic polyps 01/01/2019   Liver cirrhosis secondary to NASH ([place]) 01/22/2018   Dysphagia 04/19/2017   Abdominal pain, epigastric 04/19/2017   Abdominal pain 12/08/2014   Postmenopausal bleeding 11/18/2014   Closed fracture of distal clavicle 04/17/2014   Muscle weakness (generalized) 03/26/2014   Pain in joint, shoulder region 03/26/2014   Decreased range of motion of right shoulder 03/26/2014   Right clavicle fracture 03/19/2014   Fatty liver 03/17/2014   Rectal bleeding 03/17/2014   Dyspepsia 03/11/2014   Morbid obesity with BMI of 50.0-59.9, adult ([place]) 09/11/2012   Hypothyroidism 09/11/2012   ADHD (attention deficit hyperactivity disorder) 09/11/2012   Anxiety 09/11/2012   GERD 12/21/2009   IRRITABLE BOWEL SYNDROME 04/22/2009   Mixed hyperlipidemia 07/09/2008   DEPRESSION 07/09/2008    Essential hypertension 07/09/2008   Gastritis and gastroduodenitis 07/09/2008   Sleep apnea 07/09/2008    REFERRING DIAG: R41.82 (ICD-10-CM) - Altered mental status, unspecified altered mental status type   THERAPY DIAG:  Altered mental status, unspecified altered mental status type  Muscle weakness (generalized)  Rationale for Evaluation and Treatment Rehabilitation  PERTINENT HISTORY: liver disease,hepatic encephalopathy, arthritis, vertigo, fibro, confusion   PRECAUTIONS: Fall  SUBJECTIVE: Patient states she fell last week. She is still really sore following. Feels more warn out.   PAIN:  Are you having pain? Yes: NPRS scale: 7.5/10 Pain location: both legs and L side Pain description: deep ache Aggravating factors: walking Relieving factors: non WB   OBJECTIVE:    DIAGNOSTIC FINDINGS: NA   COGNITION: Overall cognitive status: Within functional limits for tasks assessed  LOWER EXTREMITY MMT:     MMT Right Eval Left Eval  Hip flexion 3- 3-  Hip extension 3+ 3+  Hip abduction 4- 3+  Hip adduction      Hip internal rotation      Hip external rotation      Knee flexion      Knee extension 4 4  Ankle dorsiflexion 4 4-  Ankle plantarflexion      Ankle inversion      Ankle eversion      (Blank rows = not tested)   BED MOBILITY:  Sit to supine SBA Supine to sit SBA Rolling to Right Modified independence Rolling to Left Modified independence   TRANSFERS: Assistive device utilized: Environmental consultant - 2 wheeled  Sit to stand: CGA Stand to sit: SBA Chair to chair: CGA   GAIT: Gait pattern: decreased step length- Right and decreased step length- Left Distance walked: 140 feet  Assistive device utilized: Walker - 2 wheeled Level of assistance: SBA Comments: 2 MWT   FUNCTIONAL TESTs:  5 times sit to stand:  3 reps in 30 seconds with UEs 2 minute walk test: 140 feet with RW, decreased stride, increased fatigue      TODAY'S TREATMENT:   02/03/22 Seated physioball forward rolls x30  Seated physioball stretches diagonals x 30 bilateral LAQ 3x 10 5 second holds Seated alternating march 3x 20 bilateral Standing shoulder flexion stretch at // bars x 10  Standing hip abduction 3x 10 bilateral  Standing hamstring curls 2x 10 bilateral    01/27/22 6 inch step up anterior x10 b/l with b/l Ue support Lateral step overs 6 inch step x10 Standing march with 2 finger hold x20 reps x3 sec hold for each Standing on foam, b/l UE lifts x10, trunk rotations x10  Side step and mini squat on inside of // bars x4 laps Heel raises x20 Slant board stretch 10 sec holds x5  Ue pulleys x2 mins into flexion All  at SBA   01/24/22 6 inch step up anterior x10 b/l with b/l Ue support Lateral step overs 6 inch step x10 Anterior step down off 4 inch step x10 b/l  Standing march with 2 finger hold x20 reps  Nustep level 3 uni hill x5 mins Ambulating in clinic with Rolator 50'x3    01/05/22   spO2 at 93   Steps up onto 4 inch box x10 reps  Lateral steps onto 4 inch box x5 reps each  x2 sets  spO2 drops to 83 Standing marches in // bar x 2laps  Lateral steps with big leg lift x2 laps  spO2 at 97 Retro steps with big hip extensions x2 laps  Seated physioball forward rolls x20  Seated physioball lateral rolls x20 b/l  Seated march 2 x 10 Sit to stand from elevated mat 5x2   spO2 98   PATIENT EDUCATION: Education details: 02/03/22 HEP; on Eval findings, POC and HEP Person educated: Patient  Education method: Explanation Education comprehension: verbalized understanding     HOME EXERCISE PROGRAM: Access Code: [email or web address] URL: [email or web address]  01/03/22 - Standing Heel Raise with Support  - 2 x daily - 7 x weekly - 2 sets - 10 reps - Standing Toe Raises at Chair  - 2 x daily - 7 x weekly - 2 sets - 10 reps - Semi-Tandem Balance at [employer] Eyes Open  - 2 x daily - 7 x weekly - 1 sets - 3 reps - 30 second  hold  Date: 12/28/2021  Prepared by: Josue Hector   Exercises - Seated March  - 2-3 x daily - 7 x weekly - 2 sets - 10 reps - Seated Long Arc Quad  - 2-3 x daily - 7 x weekly - 2 sets - 10 reps - Seated Heel Toe Raises  - 2-3 x daily - 7 x weekly - 2 sets - 10 reps       GOALS: SHORT TERM GOALS: Target date: 01/18/2022   Patient will be independent with initial HEP and self-management strategies to improve functional outcomes Baseline:  Goal status: INITIAL      LONG TERM GOALS: Target date: 02/08/2022   Patient will be independent with advanced HEP and self-management strategies to improve functional outcomes Baseline:  Goal status: INITIAL   2.  Patient will be able to ambulate at least 250 feet during 2MWT with LRAD to demonstrate improved ability to perform functional mobility and associated tasks. Baseline: 140 feet with rollator Goal status: INITIAL   3.  Patient will be able to perform stand x 5 in < 15 seconds to demonstrate improvement in functional mobility and reduced risk for falls Baseline: 3 reps in 30 seconds with UEs Goal status: INITIAL   4. Patient will have equal to or > 4/5 MMT throughout BLE to improve ability to perform functional mobility, stair ambulation and ADLs.  Baseline: See MMT  Goal status: INITIAL   ASSESSMENT:   CLINICAL IMPRESSION: Began session with with seated stretches to improve mobility and soreness. Tolerates seated exercises well with intermittent rest breaks. Able to transition to standing exercises toward EOS. Patient educated on continuing HEP at home to reduce soreness and improve mobility. Patient states extreme fatigue at end of session and assisted patient to truck with daughter to pick her up. Patient will continue to benefit from physical therapy in order to improve function and reduce impairment.       OBJECTIVE IMPAIRMENTS Abnormal gait, decreased activity tolerance, decreased balance, decreased endurance, decreased  mobility, difficulty walking, decreased strength, and pain.    ACTIVITY LIMITATIONS standing, squatting, stairs, transfers, bed mobility, and locomotion level   PARTICIPATION LIMITATIONS: meal prep, cleaning, laundry, shopping, and community activity   PERSONAL FACTORS Past/current experiences and 3+ comorbidities: See history above  are also affecting patient's functional outcome.    REHAB POTENTIAL: Fair See above    CLINICAL DECISION MAKING: Stable/uncomplicated   EVALUATION COMPLEXITY: Low   PLAN: PT FREQUENCY: 2x/week   PT DURATION: 6 weeks   PLANNED INTERVENTIONS: Therapeutic exercises, Therapeutic activity, Neuromuscular re-education, Balance training, Gait training, Patient/Family education, Joint manipulation, Joint mobilization, Stair training, Aquatic Therapy, Dry Needling, Electrical stimulation, Spinal manipulation, Spinal mobilization, Cryotherapy, Moist heat, scar mobilization, Taping, Traction, Ultrasound, Biofeedback, Ionotophoresis 22m/ml Dexamethasone, and Manual therapy.     PLAN FOR NEXT SESSION: Progress functional strength, gait and balance as tolerated.      AVianne BullsZaunegger, PT 02/03/2022, 11:14 AM

## 2022-02-07 ENCOUNTER — Encounter (HOSPITAL_COMMUNITY): Payer: Medicare Other

## 2022-02-09 ENCOUNTER — Encounter (HOSPITAL_COMMUNITY): Payer: Medicare Other | Admitting: Physical Therapy

## 2022-02-22 ENCOUNTER — Other Ambulatory Visit: Payer: Self-pay

## 2022-02-22 ENCOUNTER — Emergency Department (HOSPITAL_COMMUNITY)
Admission: EM | Admit: 2022-02-22 | Discharge: 2022-02-22 | Disposition: A | Discharge status: Home / Self Care | Payer: Medicare Other | Attending: Student | Admitting: Student

## 2022-02-22 ENCOUNTER — Encounter (HOSPITAL_COMMUNITY): Payer: Self-pay | Admitting: Emergency Medicine

## 2022-02-22 DIAGNOSIS — W268XXA Contact with other sharp object(s), not elsewhere classified, initial encounter: Secondary | ICD-10-CM | POA: Diagnosis not present

## 2022-02-22 DIAGNOSIS — S81812A Laceration without foreign body, left lower leg, initial encounter: Secondary | ICD-10-CM | POA: Insufficient documentation

## 2022-02-22 DIAGNOSIS — Z23 Encounter for immunization: Secondary | ICD-10-CM | POA: Diagnosis not present

## 2022-02-22 DIAGNOSIS — S8992XA Unspecified injury of left lower leg, initial encounter: Secondary | ICD-10-CM | POA: Diagnosis present

## 2022-02-22 MED ORDER — HYDROCODONE-ACETAMINOPHEN 5-325 MG PO TABS: 1.0000 | ORAL_TABLET | ORAL | 0 refills | Status: DC | PRN

## 2022-02-22 MED ORDER — TETANUS-DIPHTH-ACELL PERTUSSIS 5-2.5-18.5 LF-MCG/0.5 IM SUSY
0.5000 mL | PREFILLED_SYRINGE | Freq: Once | INTRAMUSCULAR | Status: AC
  Administered 2022-02-22: 0.5 mL via INTRAMUSCULAR
  Filled 2022-02-22: qty 0.5

## 2022-02-22 NOTE — Discharge Instructions (Signed)
Staple removal in 10 days.  Watch carefully for any sign of infection

## 2022-02-22 NOTE — ED Triage Notes (Signed)
Pt presents with laceration to left lower leg, large amount of bleeding, currently cleaning leg.

## 2022-02-22 NOTE — ED Provider Notes (Signed)
[facility] EMERGENCY DEPARTMENT Provider Note   CSN: [id number] Arrival date & time: 02/22/22  1311     History  Chief Complaint  Patient presents with   Leg Injury    Yolanda [name] is a 66 y.o. female.  Pt reports she hit a plastic container and it tore skin on her leg.  Pt has good range of motion of her leg   The history is provided by the patient. No language interpreter was used.       Home Medications Prior to Admission medications   Medication Sig Start Date End Date Taking? Authorizing Provider  albuterol (VENTOLIN HFA) 108 (90 Base) MCG/ACT inhaler Inhale 1-2 puffs into the lungs every 6 (six) hours as needed for wheezing or shortness of breath. 11/08/19   Long, Wonda Olds, MD  bisacodyl (BISACODYL) 5 MG EC tablet Take 10 mg by mouth daily as needed.    [provider]  blood glucose meter kit and supplies Dispense based on patient and insurance preference. Use up to four times daily as directed. (FOR ICD-10 E10.9, E11.9). 09/22/21   Johnson, Clanford L, MD  budesonide-formoterol (SYMBICORT) 80-4.5 MCG/ACT inhaler Take 2 puffs first thing in am and then another 2 puffs about 12 hours later. 11/25/21   Tanda Rockers, MD  Cholecalciferol (VITAMIN D3) 2000 units TABS Take 2,000 Units by mouth daily.     [provider]  CRANBERRY PO Take 500 mg by mouth daily.    [provider]  DULoxetine (CYMBALTA) 60 MG capsule Take 60 mg by mouth daily. 07/15/21   [provider]  inFLIXimab (REMICADE IV) Inject into the vein. Q three months per patient.    [provider]  lactulose, encephalopathy, (CHRONULAC) 10 GM/15ML SOLN Take 30 mLs (20 g total) by mouth daily. Ok to take additional dose if No BM in 24 hrs Patient taking differently: Take 20 g by mouth daily. Ok to take additional dose if No BM in 24 hrs 12/07/21   Roxan Hockey, MD  Levothyroxine Sodium 100 MCG CAPS Take 75 mcg by mouth daily before breakfast.    [provider]  meclizine (ANTIVERT) 25 MG tablet Take 25 mg by mouth 3 (three) times daily as needed for dizziness.    [provider]  Melatonin 10 MG CAPS Take 10 mg by mouth at bedtime as needed (sleep).    [provider]  metFORMIN (GLUCOPHAGE-XR) 500 MG 24 hr tablet 1 po with supper x 1 week, then 1 po BID WC x 1 week, then 2 po BID WC Patient taking differently: Take 500 mg by mouth 2 (two) times daily. 09/22/21   Johnson, Clanford L, MD  olmesartan-hydrochlorothiazide (BENICAR HCT) 40-12.5 MG tablet Take 1 tablet by mouth daily. 12/07/21   Roxan Hockey, MD  omeprazole (PRILOSEC) 20 MG capsule Take 1 capsule (20 mg total) by mouth 2 (two) times daily before a meal. 1 po 30 mins prior to [place]. Patient taking differently: Take 20 mg by mouth 2 (two) times daily as needed (gerd). 03/09/21 03/09/22  Eloise Harman, DO  rosuvastatin (CRESTOR) 10 MG tablet Take 10 mg by mouth daily.    [provider]      Allergies    Azithromycin, Lisinopril, and Sulfonamide derivatives    Review of Systems   Review of Systems  Skin:  Positive for wound.  All other systems reviewed and are negative.   Physical Exam Updated Vital Signs BP (!) 142/78 (BP  Location: Left Arm)   Pulse (!) 109   Temp 98.3 F (36.8 C) (Oral)   Resp 20   Ht 5' 2.5" (1.588 m)   Wt 127 kg   LMP 07/11/2007 (Approximate)   SpO2 100%   BMI 50.40 kg/m  Physical Exam Vitals and nursing note reviewed.  Constitutional:      Appearance: She is well-developed.  HENT:     Head: Normocephalic.     Mouth/Throat:     Mouth: Mucous membranes are moist.  Eyes:     Extraocular Movements: Extraocular movements intact.     Pupils: Pupils are equal, round, and reactive to light.  Cardiovascular:     Rate and Rhythm: Normal rate.  Pulmonary:     Effort: Pulmonary effort is normal.  Abdominal:     General: There is no distension.  Musculoskeletal:        General: Signs of injury present.  Normal range of motion.     Comments: 12 cm laceration anterior left lower leg gapping, oozing  from foot and ankle,  normal sensation   Skin:    General: Skin is warm.  Neurological:     General: No focal deficit present.     Mental Status: She is alert and oriented to person, place, and time.  Psychiatric:        Mood and Affect: Mood normal.     ED Results / Procedures / Treatments   Labs (all labs ordered are listed, but only abnormal results are displayed) Labs Reviewed - No data to display  EKG None  Radiology No results found.  Procedures .Marland KitchenLaceration Repair  Date/Time: 02/22/2022 2:20 PM  Performed by: Fransico Meadow, PA-C Authorized by: Fransico Meadow, PA-C   Consent:    Consent obtained:  Verbal   Consent given by:  Patient   Risks, benefits, and alternatives were discussed: yes     Risks discussed:  Infection and pain Universal protocol:    Procedure explained and questions answered to patient or proxy's satisfaction: yes     Immediately prior to procedure, a time out was called: yes     Patient identity confirmed:  Verbally with patient Anesthesia:    Anesthesia method:  Local infiltration Laceration details:    Location:  Leg   Leg location:  L lower leg   Length (cm):  12 Pre-procedure details:    Preparation:  Patient was prepped and draped in usual sterile fashion Treatment:    Area cleansed with:  Povidone-iodine   Amount of cleaning:  Standard   Irrigation solution:  Sterile saline   Debridement:  None Skin repair:    Repair method:  Staples   Number of staples:  10 Approximation:    Approximation:  Loose Repair type:    Repair type:  Simple     Medications Ordered in ED Medications  Tdap (BOOSTRIX) injection 0.5 mL (has no administration in time range)    ED Course/ Medical Decision Making/ A&P                           Medical Decision Making Amount and/or Complexity of Data Reviewed Independent Historian:     Details: Pt  here with daughter who is supportive External Data Reviewed: notes.    Details: pulmonary notes reviewed  Risk OTC drugs. Prescription drug management. Risk Details: Laceration closed,  see procedure note           Final Clinical Impression(s) /  ED Diagnoses Final diagnoses:  Laceration of left lower leg, initial encounter    Rx / DC Orders ED Discharge Orders     None      An After Visit Summary was printed and given to the patient.    Fransico Meadow, Hershal Coria 02/22/22 [place], MD 02/22/22 [id number]

## 2022-02-28 ENCOUNTER — Telehealth: Payer: Self-pay | Admitting: Internal Medicine

## 2022-02-28 NOTE — Telephone Encounter (Signed)
Spoke with pt and gave her the number to the pt assistance program for xifaxan.

## 2022-02-28 NOTE — Telephone Encounter (Signed)
Pt aware of OV with Dr Abbey Chatters. She has another question regarding her medication. [phone]

## 2022-03-01 ENCOUNTER — Ambulatory Visit (HOSPITAL_COMMUNITY)
Admission: RE | Admit: 2022-03-01 | Discharge: 2022-03-01 | Disposition: A | Discharge status: Home / Self Care | Payer: Medicare Other | Source: Ambulatory Visit | Attending: Urology | Admitting: Urology

## 2022-03-01 DIAGNOSIS — N200 Calculus of kidney: Secondary | ICD-10-CM | POA: Insufficient documentation

## 2022-03-07 ENCOUNTER — Ambulatory Visit: Payer: Medicare Other | Admitting: Nutrition

## 2022-03-08 ENCOUNTER — Encounter: Payer: Self-pay | Admitting: Urology

## 2022-03-08 ENCOUNTER — Telehealth: Payer: Self-pay

## 2022-03-08 ENCOUNTER — Ambulatory Visit (INDEPENDENT_AMBULATORY_CARE_PROVIDER_SITE_OTHER): Payer: Medicare Other | Admitting: Urology

## 2022-03-08 VITALS — BP 135/77 | HR 114

## 2022-03-08 DIAGNOSIS — N200 Calculus of kidney: Secondary | ICD-10-CM

## 2022-03-08 LAB — URINALYSIS, ROUTINE W REFLEX MICROSCOPIC
Bilirubin, UA: NEGATIVE
Glucose, UA: NEGATIVE
Leukocytes,UA: NEGATIVE
Nitrite, UA: NEGATIVE
RBC, UA: NEGATIVE
Specific Gravity, UA: 1.02 (ref 1.005–1.030)
Urobilinogen, Ur: 4 mg/dL — ABNORMAL HIGH (ref 0.2–1.0)
pH, UA: 6 (ref 5.0–7.5)

## 2022-03-08 NOTE — Progress Notes (Signed)
03/08/2022 1:45 PM   Yolanda [name] Feb 26, 1956 [id number]  Referring provider: Sharilyn Sites, MD [address] [place],  [place] [address]  Followup nephrolithiasis   HPI: Ms Yolanda [name] is a 66yo here for followup for nephrolithiasis. No stone events since last visit. She has decreased her water consumption due to lower extremity edema. Renal US 03/01/2022 shows no calculi and no hydronephrosis. She was recently diagnosed with DMII and is currently taking metformin. A1c 5.4.    PMH: Past Medical History:  Diagnosis Date   Abnormal uterine bleeding    Anxiety    Arthritis    Calculus of kidney 07/09/2008   Qualifier: Diagnosis of  By: Kellie Simmering LPN, Almyra Free     Chronic abdominal pain    Chronic pain in left foot    Clotting disorder ([place])    Depression    Elevated liver function tests 2018   Endometriosis    Family history of adverse reaction to anesthesia    Fatty liver    Fibromyalgia    Gastritis    GERD 12/21/2009   Qualifier: Diagnosis of  By: Craige Cotta     History of kidney stones    Hypertension    stopped meds in Aug 2015   Hypothyroidism    IBS (irritable bowel syndrome)    Internal hemorrhoids    Kidney stone    Obesity, morbid ([place]) 09/11/2012   PE 07/09/2008   Qualifier: Diagnosis of  By: Kellie Simmering LPN, Almyra Free     PE (pulmonary embolism)    PONV (postoperative nausea and vomiting)    Sleep apnea    Thyroid disease    Vitamin D deficiency     Surgical History: Past Surgical History:  Procedure Laterality Date   ABDOMINAL SURGERY     laparoscopy   BIOPSY N/A 03/24/2014   Procedure: GASTRIC BIOPSIES;  Surgeon: Danie Binder, MD;  Location: [facility];  Service: Endoscopy;  Laterality: N/A;   BIOPSY  10/28/2019   Procedure: BIOPSY;  Surgeon: Danie Binder, MD;  Location: [facility];  Service: Endoscopy;;  gastric nodule   BREAST LUMPECTOMY Right    CESAREAN SECTION     X2   COLONOSCOPY  2006   internal hemorrhoids   COLONOSCOPY WITH PROPOFOL N/A  03/24/2014   Dr. [name]: 2 tubular adenomas removed, hemorrhoids   COLONOSCOPY WITH PROPOFOL N/A 10/28/2019   Fields: External and internal hemorrhoids, 8 polyps ranging from 2 to 5 mm in size removed from the colon.  Multiple tubular adenomas.  Next colonoscopy in 3 years.   CYSTOSCOPY W/ RETROGRADES  01/23/2012   Procedure: CYSTOSCOPY WITH RETROGRADE PYELOGRAM;  Surgeon: Marissa Nestle, MD;  Location: [facility];  Service: Urology;  Laterality: Left;   DILATATION & CURETTAGE/HYSTEROSCOPY WITH MYOSURE N/A 11/18/2014   Procedure: DILATATION & CURETTAGE/HYSTEROSCOPY WITH MYOSURE, resection of polyp;  Surgeon: Cheri Fowler, MD;  Location: [facility];  Service: Gynecology;  Laterality: N/A;   DILATION AND CURETTAGE OF UTERUS     ESOPHAGOGASTRODUODENOSCOPY   08/24/2006   Dr. Veto Kemps erythema of the antrum without erosion or ulcers/Otherwise, normal esophagus without evidence of Barrett's path with chronic gastritis   ESOPHAGOGASTRODUODENOSCOPY (EGD) WITH PROPOFOL N/A 03/24/2014   Dr. [name]: gastritis   ESOPHAGOGASTRODUODENOSCOPY (EGD) WITH PROPOFOL N/A 10/28/2019   Fields: Esophagus appeared normal, empiric dilation due to history of dysphagia.  6 mm nodule seen in the gastric cardia.  Mild portal hypertensive gastropathy.  Nodule from the stomach biopsied and showed mild chronic gastritis, no H.  pylori.   POLYPECTOMY N/A 03/24/2014   Procedure: POLYPECTOMY;  Surgeon: Danie Binder, MD;  Location: [facility];  Service: Endoscopy;  Laterality: N/A;   POLYPECTOMY  10/28/2019   Procedure: POLYPECTOMY;  Surgeon: Danie Binder, MD;  [facility];  Service: Endoscopy;;  hepatic flexure, ascending colon,sigmoid colon, rectal   REMOVAL OF STONES  01/23/2012   Procedure: REMOVAL OF STONES;  Surgeon: Marissa Nestle, MD;  Location: [facility];  Service: Urology;  Laterality: N/A;   SAVORY DILATION N/A 10/28/2019   Procedure: SAVORY DILATION;  Surgeon: Danie Binder, MD;  [facility];   Service: Endoscopy;  Laterality: N/A;   TUBAL LIGATION      Home Medications:  Allergies as of 03/08/2022       Reactions   Azithromycin Hives   Lisinopril Cough   Sulfonamide Derivatives Other (See Comments)   Vaginal Infection         Medication List        Accurate as of March 08, 2022  1:45 PM. If you have any questions, ask your nurse or doctor.          albuterol 108 (90 Base) MCG/ACT inhaler Commonly known as: VENTOLIN HFA Inhale 1-2 puffs into the lungs every 6 (six) hours as needed for wheezing or shortness of breath.   bisacodyl 5 MG EC tablet Generic drug: bisacodyl Take 10 mg by mouth daily as needed.   blood glucose meter kit and supplies Dispense based on patient and insurance preference. Use up to four times daily as directed. (FOR ICD-10 E10.9, E11.9).   budesonide-formoterol 80-4.5 MCG/ACT inhaler Commonly known as: Symbicort Take 2 puffs first thing in am and then another 2 puffs about 12 hours later.   CRANBERRY PO Take 500 mg by mouth daily.   DULoxetine 60 MG capsule Commonly known as: CYMBALTA Take 60 mg by mouth daily.   HYDROcodone-acetaminophen 5-325 MG tablet Commonly known as: NORCO/VICODIN Take 1 tablet by mouth every 4 (four) hours as needed for moderate pain.   lactulose (encephalopathy) 10 GM/15ML Soln Commonly known as: CHRONULAC Take 30 mLs (20 g total) by mouth daily. Ok to take additional dose if No BM in 24 hrs   Levothyroxine Sodium 100 MCG Caps Take 75 mcg by mouth daily before breakfast.   meclizine 25 MG tablet Commonly known as: ANTIVERT Take 25 mg by mouth 3 (three) times daily as needed for dizziness.   Melatonin 10 MG Caps Take 10 mg by mouth at bedtime as needed (sleep).   metFORMIN 500 MG 24 hr tablet Commonly known as: GLUCOPHAGE-XR 1 po with supper x 1 week, then 1 po BID WC x 1 week, then 2 po BID WC What changed:  how much to take how to take this when to take this additional instructions    olmesartan-hydrochlorothiazide 40-12.5 MG tablet Commonly known as: Benicar HCT Take 1 tablet by mouth daily.   omeprazole 20 MG capsule Commonly known as: PRILOSEC Take 1 capsule (20 mg total) by mouth 2 (two) times daily before a meal. 1 po 30 mins prior to [place]. What changed:  when to take this reasons to take this additional instructions   REMICADE IV Inject into the vein. Q three months per patient.   rosuvastatin 10 MG tablet Commonly known as: CRESTOR Take 10 mg by mouth daily.   Vitamin D3 50 MCG (2000 UT) Tabs Take 2,000 Units by mouth daily.  Allergies:  Allergies  Allergen Reactions   Azithromycin Hives   Lisinopril Cough   Sulfonamide Derivatives Other (See Comments)    Vaginal Infection     Family History: Family History  Problem Relation Age of Onset   Heart attack Mother        CABG   Stroke Mother    Hypertension Mother    Cancer Mother    Diabetes Mother    Heart attack Father        CABG   Hypertension Father    Mesothelioma Father    Heart attack Brother    Diabetes Brother    Depression Brother    Hyperlipidemia Brother    Diabetes Maternal Grandmother    Diabetes Maternal Grandfather    Cancer Paternal Grandmother    Diabetes Brother    Hyperlipidemia Brother    Wilson's disease Other        2 nieces, one died in her 78s   Colon cancer Neg Hx     Social History:  reports that she quit smoking about 23 years ago. Her smoking use included cigarettes. She has never used smokeless tobacco. She reports that she does not currently use alcohol. She reports that she does not use drugs.  ROS: All other review of systems were reviewed and are negative except what is noted above in HPI  Physical Exam: BP 135/77   Pulse (!) 114   LMP 07/11/2007 (Approximate)   Constitutional:  Alert and oriented, No acute distress. HEENT: [place] AT, moist mucus membranes.  Trachea midline, no masses. Cardiovascular: No clubbing, cyanosis, or  edema. Respiratory: Normal respiratory effort, no increased work of breathing. GI: Abdomen is soft, nontender, nondistended, no abdominal masses GU: No CVA tenderness.  Lymph: No cervical or inguinal lymphadenopathy. Skin: No rashes, bruises or suspicious lesions. Neurologic: Grossly intact, no focal deficits, moving all 4 extremities. Psychiatric: Normal mood and affect.  Laboratory Data: Lab Results  Component Value Date   WBC 8.8 01/02/2022   HGB 13.2 01/02/2022   HCT 38.9 01/02/2022   MCV 97 01/02/2022   PLT 156 01/02/2022    Lab Results  Component Value Date   CREATININE 0.82 01/02/2022    No results found for: "PSA"  No results found for: "TESTOSTERONE"  Lab Results  Component Value Date   HGBA1C 5.4 12/07/2021    Urinalysis    Component Value Date/Time   COLORURINE YELLOW 12/06/2021 1650   APPEARANCEUR Turbid (A) 01/02/2022 1140   LABSPEC 1.016 12/06/2021 1650   PHURINE 6.0 12/06/2021 1650   GLUCOSEU Negative 01/02/2022 1140   HGBUR SMALL (A) 12/06/2021 1650   BILIRUBINUR Negative 01/02/2022 1140   KETONESUR NEGATIVE 12/06/2021 1650   PROTEINUR 1+ (A) 01/02/2022 1140   PROTEINUR NEGATIVE 12/06/2021 1650   UROBILINOGEN negative (A) 07/02/2019 0932   UROBILINOGEN 0.2 04/15/2015 0028   NITRITE Negative 01/02/2022 1140   NITRITE NEGATIVE 12/06/2021 1650   LEUKOCYTESUR 2+ (A) 01/02/2022 1140   LEUKOCYTESUR MODERATE (A) 12/06/2021 1650    Lab Results  Component Value Date   LABMICR See below: 08/02/2020   WBCUA 0-5 08/02/2020   RBCUA 0-2 03/07/2017   LABEPIT 0-10 08/02/2020   MUCUS Present 03/07/2017   BACTERIA MANY (A) 12/06/2021    Pertinent Imaging: Renal US 03/01/2022: Images reviewed and discussed with the patient  No results found for this or any previous visit.  No results found for this or any previous visit.  No results found for this or any previous visit.  No results found for this or any previous visit.  Results for orders placed  during the hospital encounter of 03/01/22  US RENAL  Narrative CLINICAL DATA:  Follow-up kidney stones.  EXAM: RENAL / URINARY TRACT ULTRASOUND COMPLETE  COMPARISON:  CT abdomen dated 09/27/2017. RIGHT upper quadrant ultrasound dated 12/08/2021.  FINDINGS: Right Kidney:  Renal measurements: 12.5 x 6.4 x 6.7 cm = volume: 282 mL. Limited visualization due to body habitus. Echogenicity within normal limits where seen. No mass or hydronephrosis visualized worsening.  Left Kidney:  Renal measurements: 12.7 x 7.9 x 5.8 cm = volume: 303 mL. Limited visualization due to body habitus. Echogenicity within normal limits. No mass or hydronephrosis visualized.  Bladder:  Decompressed, limiting characterization. Appears normal for degree of bladder distention.  Other:  None.  IMPRESSION: 1. No acute findings. Visualized portions of the kidneys appear normal. No hydronephrosis. 2. The small renal stones seen on CT abdomen of 09/27/2017 are not able to be visualized by ultrasound.   Electronically Signed By: Franki Cabot M.D. On: 03/01/2022 16:36  No results found for this or any previous visit.  No results found for this or any previous visit.  Results for orders placed during the hospital encounter of 09/27/17  CT RENAL STONE STUDY  Narrative CLINICAL DATA:  Bilateral flank pain for 1 month.  EXAM: CT ABDOMEN AND PELVIS WITHOUT CONTRAST  TECHNIQUE: Multidetector CT imaging of the abdomen and pelvis was performed following the standard protocol without IV contrast.  COMPARISON:  CT scan 04/24/2016  FINDINGS: Lower chest: The lung bases are clear of acute process. Peripheral interstitial changes appear stable and likely related to interstitial lung disease. No pleural effusion or focal pulmonary lesion/nodule. The heart is upper limits of normal in size for age. Prominent epicardial and pericardial fat. Mild lipomatosis hypertrophy of the interatrial septum.  The distal esophagus is grossly normal.  Hepatobiliary: Progressive cirrhotic changes involving the liver. Very irregular liver contour with increased caudate to right lobe ratio, dilated hepatic fissures and evidence of portal venous hypertension and portal venous collaterals. No splenomegaly. No ascites.  No focal hepatic lesions or intrahepatic biliary dilatation. The gallbladder is grossly normal. No common bile duct dilatation.  Pancreas: No mass, inflammation or ductal dilatation.  Spleen: Normal size.  No focal lesions.  Adrenals/Urinary Tract: The adrenal glands are unremarkable.  Two right-sided renal calculi. No left-sided renal calculi. No obstructing ureteral calculi or bladder calculi. No worrisome renal or bladder lesions without contrast.  Stomach/Bowel: The stomach, duodenum, small bowel and colon are grossly normal without oral contrast. No inflammatory changes, mass lesions or obstructive findings. The terminal ileum and appendix are normal.  Vascular/Lymphatic: The aorta is normal in caliber. No atheroscerlotic calcifications. No mesenteric of retroperitoneal mass or adenopathy. Small scattered lymph nodes are noted.  Reproductive: The uterus and ovaries are normal.  Other: No pelvic mass or adenopathy. No free pelvic fluid collections. No inguinal mass or adenopathy. No abdominal wall hernia or subcutaneous lesions.  Musculoskeletal: No significant bony findings. Mild degenerative changes involving both hips and the lumbar spine.  IMPRESSION: 1. Two right-sided renal calculi but no obstructing ureteral calculi or bladder calculi. No worrisome renal or bladder lesions without contrast. 2. No acute abdominal/pelvic findings, mass lesions or lymphadenopathy. 3. Progressive cirrhotic changes involving the liver as described above. No worrisome hepatic lesion, splenomegaly or ascites.   Electronically Signed By: Marijo Sanes M.D. On: 09/28/2017  10:37   Assessment & Plan:    1. Kidney  stones -RTC 1 year with renal US - Urinalysis, Routine w reflex microscopic   No follow-ups on file.  Nicolette Bang, MD  [facility] [place]

## 2022-03-08 NOTE — Patient Instructions (Signed)

## 2022-03-08 NOTE — Telephone Encounter (Signed)
Pt called stating that she received the Xifaxan today and is wanting to know if she is to continue taking the lactulose as well. Please advise.

## 2022-03-09 NOTE — Telephone Encounter (Signed)
Pt was made aware.  

## 2022-03-09 NOTE — Telephone Encounter (Signed)
Yes would continue both.  Thank you

## 2022-03-14 ENCOUNTER — Encounter: Payer: Self-pay | Admitting: Pulmonary Disease

## 2022-03-14 ENCOUNTER — Other Ambulatory Visit: Payer: Self-pay | Admitting: Pulmonary Disease

## 2022-03-14 ENCOUNTER — Ambulatory Visit (INDEPENDENT_AMBULATORY_CARE_PROVIDER_SITE_OTHER): Payer: Medicare Other | Admitting: Pulmonary Disease

## 2022-03-14 VITALS — BP 132/78 | HR 112 | Temp 98.4°F | Ht 62.5 in | Wt 277.0 lb

## 2022-03-14 DIAGNOSIS — G4733 Obstructive sleep apnea (adult) (pediatric): Secondary | ICD-10-CM

## 2022-03-14 DIAGNOSIS — G4752 REM sleep behavior disorder: Secondary | ICD-10-CM

## 2022-03-14 DIAGNOSIS — J9611 Chronic respiratory failure with hypoxia: Secondary | ICD-10-CM

## 2022-03-14 NOTE — Progress Notes (Signed)
[facility]  Chief Complaint  Patient presents with   Consult    Sleep consult per Dr. Melvyn Novas     Past Surgical History:  She  has a past surgical history that includes Cesarean section; Dilation and curettage of uterus; Abdominal surgery; Cystoscopy w/ retrogrades (01/23/2012); removal of stones (01/23/2012); Colonoscopy (2006); Esophagogastroduodenoscopy ( 08/24/2006); Colonoscopy with propofol (N/A, 03/24/2014); polypectomy (N/A, 03/24/2014); Esophagogastroduodenoscopy (egd) with propofol (N/A, 03/24/2014); biopsy (N/A, 03/24/2014); Dilatation & curettage/hysteroscopy with myosure (N/A, 11/18/2014); Tubal ligation; Breast lumpectomy (Right); Colonoscopy with propofol (N/A, 10/28/2019); Esophagogastroduodenoscopy (egd) with propofol (N/A, 10/28/2019); [place] dilation (N/A, 10/28/2019); polypectomy (10/28/2019); and biopsy (10/28/2019).  Past Medical History:  Anxiety, Arthritis, Nephrolithiasis, Depression, Endometriosis, Fatty liver with cirrhosis, Fibromyalgia, GERD, HTN, IBS, PE June 2007, Hypothyroidism, Vit D deficiency, Psoriatic arthritis, Rheumatoid arthritis  Constitutional:  BP 132/78 (BP Location: Left Arm, Patient Position: Sitting)   Pulse (!) 112   Temp 98.4 F (36.9 C) (Temporal)   Ht 5' 2.5" (1.588 m)   Wt 277 lb (125.6 kg)   LMP 07/11/2007 (Approximate)   SpO2 94%   BMI 49.86 kg/m   Brief Summary:  Yolanda [name] is a 66 y.o. female former smoker with obstructive sleep apnea, chronic respiratory failure, possible REM sleep behavior disorder, and cough variant asthma.      Subjective:   She was diagnosed with sleep apnea years ago.  She had her most recently sleep study a couple of years ago.  She has a CPAP machine through [employer].  She hasn't been able to use his recently because her home is being renovated, and she was started on supplemental oxygen recent but doesn't have the connection for oxygen with CPAP.  She  snores at night and stops breathing.  She gets vivid dreams and acts out her dreams.  She punches and kicks, and has fallen out of bed.  She recently started using melatonin to help fall asleep.  She goes to bed at 8 pm.  She feels tired all the time and falls asleep whenever sitting quiet.  She wakes up frequently to use the bathroom.  She feels exhausted when she wakes up in the morning.  Not using anything to help stay awake.  She does talk in her sleep.  She denies sleep walking bruxism, or nightmares.  There is no history of restless legs.  She denies sleep hallucinations, sleep paralysis, or cataplexy.  The Epworth score is 19 out of 24.   Physical Exam:   Appearance - well kempt, wearing oxygen  ENMT - no sinus tenderness, no oral exudate, no LAN, Mallampati 3 airway, no stridor  Respiratory - equal breath sounds bilaterally, no wheezing or rales  CV - s1s2 regular rate and rhythm, no murmurs  Ext - no clubbing, no edema  Skin - no rashes  Psych - normal mood and affect   Pulmonary testing:  PFT 02/23/16 >> FEV1 1.85 (73%), FEV1% 94, TLC 6.10 (120%), DLCO 71%  Chest Imaging:  CT angio chest 09/20/21 >> multifocal patchy ASD, cirrhosis  Sleep Tests:    Cardiac Tests:  Echo 12/07/21 >> EF 65 to 70%, mild LVH  Social History:  She  reports that she quit smoking about 23 years ago. Her smoking use included cigarettes. She has never used smokeless tobacco. She reports that she does not currently use alcohol. She reports that she does not use drugs.  Family History:  Her family history includes Cancer in her mother and  paternal grandmother; Depression in her brother; Diabetes in her brother, brother, maternal grandfather, maternal grandmother, and mother; Heart attack in her brother, father, and mother; Hyperlipidemia in her brother and brother; Hypertension in her father and mother; Mesothelioma in her father; Stroke in her mother; Wilson's disease in an other family member.      Assessment/Plan:   Obstructive sleep apnea. - will get copy of her previous sleep study from Dr. Hilma Favors or [facility] - will arrange for in lab titration study to optimize PAP setting by determining whether she needs CPAP versus Bipap, and how much supplemental oxygen she needs at night  Possibly REM sleep behavior disorder. - explained how untreated sleep apnea can mimic this - okay to continue melatonin 5 to 10 mg nightly for now  Cough variant asthma. - followed by Dr. Melvyn Novas - continue symbicort - prn ventolin  Chronic hypoxic respiratory failure. - uses [employer] for her DME - goal SpO2 > 90% - using 2 to 3 liters oxygen - will determine if she needs supplemental oxygen at night with PAP therapy after review of her sleep study  NASH with cirrhosis. - followed by Dr. Hurshel Keys with [facility]  Psoriatic and Rheumatoid arthritis. - followed by Leafy Kindle with [facility]  Time Spent Involved in Patient Care on Day of Examination:  50 minutes  Follow up:  There are no Patient Instructions on file for this visit.  Medication List:   Allergies as of 03/14/2022       Reactions   Azithromycin Hives   Lisinopril Cough   Sulfonamide Derivatives Other (See Comments)   Vaginal Infection         Medication List        Accurate as of March 14, 2022  9:44 AM. If you have any questions, ask your nurse or doctor.          albuterol 108 (90 Base) MCG/ACT inhaler Commonly known as: VENTOLIN HFA Inhale 1-2 puffs into the lungs every 6 (six) hours as needed for wheezing or shortness of breath.   bisacodyl 5 MG EC tablet Generic drug: bisacodyl Take 10 mg by mouth daily as needed.   blood glucose meter kit and supplies Dispense based on patient and insurance preference. Use up to four times daily as directed. (FOR ICD-10 E10.9, E11.9).   budesonide-formoterol 80-4.5 MCG/ACT inhaler Commonly known as: Symbicort Take  2 puffs first thing in am and then another 2 puffs about 12 hours later.   clotrimazole 10 MG troche Commonly known as: MYCELEX Take 10 mg by mouth 5 (five) times daily.   CRANBERRY PO Take 500 mg by mouth daily.   DULoxetine 60 MG capsule Commonly known as: CYMBALTA Take 60 mg by mouth daily.   HYDROcodone-acetaminophen 5-325 MG tablet Commonly known as: NORCO/VICODIN Take 1 tablet by mouth every 4 (four) hours as needed for moderate pain.   lactulose (encephalopathy) 10 GM/15ML Soln Commonly known as: CHRONULAC Take 30 mLs (20 g total) by mouth daily. Ok to take additional dose if No BM in 24 hrs   Levothyroxine Sodium 100 MCG Caps Take 75 mcg by mouth daily before breakfast.   meclizine 25 MG tablet Commonly known as: ANTIVERT Take 25 mg by mouth 3 (three) times daily as needed for dizziness.   Melatonin 10 MG Caps Take 10 mg by mouth at bedtime as needed (sleep).   metFORMIN 500 MG 24 hr tablet Commonly known as: GLUCOPHAGE-XR 1 po with supper x 1 week, then  1 po BID WC x 1 week, then 2 po BID WC What changed:  how much to take how to take this when to take this additional instructions   olmesartan-hydrochlorothiazide 40-12.5 MG tablet Commonly known as: Benicar HCT Take 1 tablet by mouth daily.   omeprazole 20 MG capsule Commonly known as: PRILOSEC Take 1 capsule (20 mg total) by mouth 2 (two) times daily before a meal. 1 po 30 mins prior to [place]. What changed:  when to take this reasons to take this additional instructions   pilocarpine 5 MG tablet Commonly known as: SALAGEN Take by mouth 2 (two) times daily.   REMICADE IV Inject into the vein. Q three months per patient.   rifaximin 550 MG Tabs tablet Commonly known as: XIFAXAN Take 550 mg by mouth.   rosuvastatin 10 MG tablet Commonly known as: CRESTOR Take 10 mg by mouth daily.   Vitamin D3 50 MCG (2000 UT) Tabs Take 2,000 Units by mouth daily.        Signature:  Chesley Mires,  MD [place] Pager - [phone] 03/14/2022, 9:44 AM

## 2022-03-21 ENCOUNTER — Encounter: Payer: Medicare Other | Attending: Family Medicine | Admitting: Nutrition

## 2022-03-21 VITALS — Ht 64.0 in | Wt 274.4 lb

## 2022-03-21 DIAGNOSIS — K7581 Nonalcoholic steatohepatitis (NASH): Secondary | ICD-10-CM | POA: Diagnosis present

## 2022-03-21 DIAGNOSIS — E1165 Type 2 diabetes mellitus with hyperglycemia: Secondary | ICD-10-CM | POA: Diagnosis present

## 2022-03-21 DIAGNOSIS — I10 Essential (primary) hypertension: Secondary | ICD-10-CM | POA: Insufficient documentation

## 2022-03-21 DIAGNOSIS — E8809 Other disorders of plasma-protein metabolism, not elsewhere classified: Secondary | ICD-10-CM | POA: Insufficient documentation

## 2022-03-21 DIAGNOSIS — E782 Mixed hyperlipidemia: Secondary | ICD-10-CM | POA: Diagnosis present

## 2022-03-21 DIAGNOSIS — E8881 Metabolic syndrome: Secondary | ICD-10-CM | POA: Diagnosis present

## 2022-03-21 DIAGNOSIS — E46 Unspecified protein-calorie malnutrition: Secondary | ICD-10-CM | POA: Insufficient documentation

## 2022-03-21 DIAGNOSIS — K7460 Unspecified cirrhosis of liver: Secondary | ICD-10-CM | POA: Insufficient documentation

## 2022-03-21 DIAGNOSIS — K582 Mixed irritable bowel syndrome: Secondary | ICD-10-CM | POA: Diagnosis present

## 2022-03-21 DIAGNOSIS — K760 Fatty (change of) liver, not elsewhere classified: Secondary | ICD-10-CM | POA: Insufficient documentation

## 2022-03-21 DIAGNOSIS — E1100 Type 2 diabetes mellitus with hyperosmolarity without nonketotic hyperglycemic-hyperosmolar coma (NKHHC): Secondary | ICD-10-CM | POA: Diagnosis present

## 2022-03-21 NOTE — Patient Instructions (Signed)
Goals  Take calcium daily Increase protein rich plant based foods Ask MD if he wants to stop Metformin Ask about low lab values.
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
## 2022-03-23 ENCOUNTER — Other Ambulatory Visit: Payer: Self-pay | Admitting: *Deleted

## 2022-03-23 ENCOUNTER — Encounter: Payer: Self-pay | Admitting: Internal Medicine

## 2022-03-23 ENCOUNTER — Ambulatory Visit (INDEPENDENT_AMBULATORY_CARE_PROVIDER_SITE_OTHER): Payer: Medicare Other | Admitting: Internal Medicine

## 2022-03-23 VITALS — BP 109/67 | HR 106 | Temp 97.3°F | Ht 64.0 in | Wt 269.0 lb

## 2022-03-23 DIAGNOSIS — E46 Unspecified protein-calorie malnutrition: Secondary | ICD-10-CM

## 2022-03-23 DIAGNOSIS — K7460 Unspecified cirrhosis of liver: Secondary | ICD-10-CM

## 2022-03-23 DIAGNOSIS — R188 Other ascites: Secondary | ICD-10-CM | POA: Diagnosis not present

## 2022-03-23 DIAGNOSIS — K7581 Nonalcoholic steatohepatitis (NASH): Secondary | ICD-10-CM | POA: Diagnosis not present

## 2022-03-23 DIAGNOSIS — E8809 Other disorders of plasma-protein metabolism, not elsewhere classified: Secondary | ICD-10-CM

## 2022-03-23 DIAGNOSIS — K7682 Hepatic encephalopathy: Secondary | ICD-10-CM

## 2022-03-23 DIAGNOSIS — K219 Gastro-esophageal reflux disease without esophagitis: Secondary | ICD-10-CM | POA: Diagnosis not present

## 2022-03-23 MED ORDER — SPIRONOLACTONE 50 MG PO TABS: 50.0000 mg | ORAL_TABLET | Freq: Every day | ORAL | 11 refills | Status: DC

## 2022-03-23 NOTE — Patient Instructions (Signed)
For your swelling in legs and abdomen, going to start you on a new diuretic called spironolactone 50 mg daily.  I want you to hold your olmesartan/HCTZ while taking this medication.  I want you to monitor your blood pressure at home as well as daily weights.  You will need to complete blood work at [facility] next week.  Continue on lactulose and Xifaxan for your hepatic encephalopathy.  Your most recent albumin levels were very low.  Recommend increasing protein in your diet as well as protein supplements (shakes, bars) twice daily.  I am going to refer you to [facility] in [place] who is a Doctor, general practice.  She is great.  Otherwise follow up with me in 4-6 weeks.   Great seeing both you again today.  Dr. Abbey Chatters

## 2022-03-23 NOTE — Progress Notes (Signed)
Cmp    Referring Provider: Celene Squibb, MD Primary Care Physician:  Celene Squibb, MD Primary GI:  Dr. Abbey Chatters  Chief Complaint  Patient presents with   Follow-up    Follow up diarrhea    HPI:   Yolanda [name] is a 66 y.o. female who presents to the clinic today for follow-up visit regarding Yolanda [name] cirrhosis.  Historically well compensated though with recent decompensation this year.  She was admitted to [facility] 12/06/2021 with encephalopathy.  Underwent significant work-up including CT head, MRI brain, carotid artery Dopplers, WNL.  Has follow-up with neurology for EEG.  Ammonia was elevated.  Started on lactulose therapy and patient clinically improved subsequently discharged.  Did have UTI treated with Rocephin discharged on Keflex.  EGD 10/28/2019 showed no evidence of esophageal varices.  Colonoscopy at the same time did show multiple polyps which were tubular adenomas on pathology.  GERD maintained on omeprazole 20 mg daily.    Ultrasound 12/08/2021 showed cirrhosis, no hepatoma there was a poor study given her body habitus.  Recommended MRI to further evaluate.  MRI 01/08/2022 with findings of cirrhosis, negative for hepatoma, for small volume ascites.  Previously seen on 12/21/2021.  Still having issues with encephalopathy/started on Xifaxan twice daily.  Most recent labs 01/26/2022 with MELD of 8, AFP WNL.  Today, accompanied by her daughter.  States that she is not doing well.  Continues to have issues with confusion at times.  States she has to check her calendar numerous times a day.  Does states she is taking her Xifaxan twice daily as prescribed.  Lactulose she states she takes it regularly though it causes diarrhea and she was under the impression she was supposed to have formed stools so has not been taking it twice daily.  Also complaining of worsening abdominal swelling.  States she has had some lower extremity edema as well.  Recent blood work by her PCP  showed severe hypoalbuminemia with albumin 1.3.  She is showing me this on her phone.   Past Medical History:  Diagnosis Date   Abnormal uterine bleeding    Anxiety    Arthritis    Chronic abdominal pain    Chronic pain in left foot    Depression    Elevated liver function tests 2018   Endometriosis    Fibromyalgia    GERD 12/21/2009   Qualifier: Diagnosis of  By: Craige Cotta     Hypertension    stopped meds in Aug 2015   Hypothyroidism    IBS (irritable bowel syndrome)    Internal hemorrhoids    Kidney stone    Liver cirrhosis secondary to NASH (HCC)    Obesity, morbid (HCC)    PONV (postoperative nausea and vomiting)    Psoriatic arthritis ([place])    Pulmonary embolism ([place]) 12/2005   Qualifier: Diagnosis of  By: Kellie Simmering LPN, Almyra Free     Rheumatoid arthritis [facility])    Sleep apnea    Vitamin D deficiency     Past Surgical History:  Procedure Laterality Date   ABDOMINAL SURGERY     laparoscopy   BIOPSY N/A 03/24/2014   Procedure: GASTRIC BIOPSIES;  Surgeon: Danie Binder, MD;  Location: [facility];  Service: Endoscopy;  Laterality: N/A;   BIOPSY  10/28/2019   Procedure: BIOPSY;  Surgeon: Danie Binder, MD;  Location: [facility];  Service: Endoscopy;;  gastric nodule   BREAST LUMPECTOMY Right    CESAREAN SECTION  X2   COLONOSCOPY  2006   internal hemorrhoids   COLONOSCOPY WITH PROPOFOL N/A 03/24/2014   Dr. [name]: 2 tubular adenomas removed, hemorrhoids   COLONOSCOPY WITH PROPOFOL N/A 10/28/2019   Fields: External and internal hemorrhoids, 8 polyps ranging from 2 to 5 mm in size removed from the colon.  Multiple tubular adenomas.  Next colonoscopy in 3 years.   CYSTOSCOPY W/ RETROGRADES  01/23/2012   Procedure: CYSTOSCOPY WITH RETROGRADE PYELOGRAM;  Surgeon: Marissa Nestle, MD;  Location: [facility];  Service: Urology;  Laterality: Left;   DILATATION & CURETTAGE/HYSTEROSCOPY WITH MYOSURE N/A 11/18/2014   Procedure: DILATATION & CURETTAGE/HYSTEROSCOPY WITH MYOSURE,  resection of polyp;  Surgeon: Cheri Fowler, MD;  Location: [facility];  Service: Gynecology;  Laterality: N/A;   DILATION AND CURETTAGE OF UTERUS     ESOPHAGOGASTRODUODENOSCOPY   08/24/2006   Dr. Veto Kemps erythema of the antrum without erosion or ulcers/Otherwise, normal esophagus without evidence of Barrett's path with chronic gastritis   ESOPHAGOGASTRODUODENOSCOPY (EGD) WITH PROPOFOL N/A 03/24/2014   Dr. [name]: gastritis   ESOPHAGOGASTRODUODENOSCOPY (EGD) WITH PROPOFOL N/A 10/28/2019   Fields: Esophagus appeared normal, empiric dilation due to history of dysphagia.  6 mm nodule seen in the gastric cardia.  Mild portal hypertensive gastropathy.  Nodule from the stomach biopsied and showed mild chronic gastritis, no H. pylori.   POLYPECTOMY N/A 03/24/2014   Procedure: POLYPECTOMY;  Surgeon: Danie Binder, MD;  Location: [facility];  Service: Endoscopy;  Laterality: N/A;   POLYPECTOMY  10/28/2019   Procedure: POLYPECTOMY;  Surgeon: Danie Binder, MD;  [facility];  Service: Endoscopy;;  hepatic flexure, ascending colon,sigmoid colon, rectal   REMOVAL OF STONES  01/23/2012   Procedure: REMOVAL OF STONES;  Surgeon: Marissa Nestle, MD;  Location: [facility];  Service: Urology;  Laterality: N/A;   SAVORY DILATION N/A 10/28/2019   Procedure: SAVORY DILATION;  Surgeon: Danie Binder, MD;  Location: [facility];  Service: Endoscopy;  Laterality: N/A;   TUBAL LIGATION      Current Outpatient Medications  Medication Sig Dispense Refill   albuterol (VENTOLIN HFA) 108 (90 Base) MCG/ACT inhaler Inhale 1-2 puffs into the lungs every 6 (six) hours as needed for wheezing or shortness of breath. 6.7 g 0   blood glucose meter kit and supplies Dispense based on patient and insurance preference. Use up to four times daily as directed. (FOR ICD-10 E10.9, E11.9). 1 each 0   budesonide-formoterol (SYMBICORT) 80-4.5 MCG/ACT inhaler Take 2 puffs first thing in am and then another 2 puffs about 12 hours  later. 1 each 12   Cholecalciferol (VITAMIN D3) 2000 units TABS Take 2,000 Units by mouth daily.      DULoxetine (CYMBALTA) 60 MG capsule Take 60 mg by mouth daily.     inFLIXimab (REMICADE IV) Inject into the vein. Q three months per patient.     lactulose, encephalopathy, (CHRONULAC) 10 GM/15ML SOLN Take 30 mLs (20 g total) by mouth daily. Ok to take additional dose if No BM in 24 hrs (Patient taking differently: Take 20 g by mouth daily. Ok to take additional dose if No BM in 24 hrs) 946 mL 1   Levothyroxine Sodium 100 MCG CAPS Take 75 mcg by mouth daily before breakfast.     meclizine (ANTIVERT) 25 MG tablet Take 25 mg by mouth 3 (three) times daily as needed for dizziness.     Melatonin 10 MG CAPS Take 10 mg by mouth at bedtime as needed (sleep).  metFORMIN (GLUCOPHAGE-XR) 500 MG 24 hr tablet 1 po with supper x 1 week, then 1 po BID WC x 1 week, then 2 po BID WC (Patient taking differently: Take 500 mg by mouth 2 (two) times daily.) 120 tablet 1   olmesartan-hydrochlorothiazide (BENICAR HCT) 40-12.5 MG tablet Take 1 tablet by mouth daily. 30 tablet 3   pilocarpine (SALAGEN) 5 MG tablet Take by mouth 2 (two) times daily.     rifaximin (XIFAXAN) 550 MG TABS tablet Take 550 mg by mouth.     rosuvastatin (CRESTOR) 10 MG tablet Take 10 mg by mouth daily.     bisacodyl (BISACODYL) 5 MG EC tablet Take 10 mg by mouth daily as needed. (Patient not taking: Reported on 03/21/2022)     clotrimazole (MYCELEX) 10 MG troche Take 10 mg by mouth 5 (five) times daily. (Patient not taking: Reported on 03/23/2022)     CRANBERRY PO Take 500 mg by mouth daily. (Patient not taking: Reported on 03/23/2022)     HYDROcodone-acetaminophen (NORCO/VICODIN) 5-325 MG tablet Take 1 tablet by mouth every 4 (four) hours as needed for moderate pain. (Patient not taking: Reported on 03/23/2022) 16 tablet 0   omeprazole (PRILOSEC) 20 MG capsule Take 1 capsule (20 mg total) by mouth 2 (two) times daily before a meal. 1 po 30 mins  prior to [place]. (Patient taking differently: Take 20 mg by mouth 2 (two) times daily as needed (gerd).) 180 capsule 3   No current facility-administered medications for this visit.    Allergies as of 03/23/2022 - Review Complete 03/23/2022  Allergen Reaction Noted   Azithromycin Hives    Lisinopril Cough 04/19/2017   Sulfonamide derivatives Other (See Comments)     Family History  Problem Relation Age of Onset   Heart attack Mother        CABG   Stroke Mother    Hypertension Mother    Cancer Mother    Diabetes Mother    Heart attack Father        CABG   Hypertension Father    Mesothelioma Father    Heart attack Brother    Diabetes Brother    Depression Brother    Hyperlipidemia Brother    Diabetes Maternal Grandmother    Diabetes Maternal Grandfather    Cancer Paternal Grandmother    Diabetes Brother    Hyperlipidemia Brother    Wilson's disease Other        2 nieces, one died in her 83s   Colon cancer Neg Hx     Social History   Socioeconomic History   Marital status: Divorced    Spouse name: Not on file   Number of children: 2   Years of education: 52   Highest education level: Not on file  Occupational History   Occupation: Social research officer, government: [employer]  Tobacco Use   Smoking status: Former    Years: 20.00    Types: Cigarettes    Quit date: 10/24/1998    Years since quitting: 23.4   Smokeless tobacco: Never   Tobacco comments:    1 cig daily  Vaping Use   Vaping Use: Never used  Substance and Sexual Activity   Alcohol use: Not Currently    Comment: "no alcohol in years"   Drug use: No   Sexual activity: Never    Birth control/protection: Post-menopausal  Other Topics Concern   Not on file  Social History Narrative   Right handed  One story with a basement   Drinks caffeine   Social Determinants of Health   Financial Resource Strain: Not on file  Food Insecurity: Not on file  Transportation Needs:  Not on file  Physical Activity: Not on file  Stress: Not on file  Social Connections: Not on file    Subjective: Review of Systems  Constitutional:  Positive for malaise/fatigue. Negative for chills and fever.  HENT:  Negative for congestion and hearing loss.   Eyes:  Negative for blurred vision and double vision.  Respiratory:  Negative for cough and shortness of breath.   Cardiovascular:  Positive for leg swelling. Negative for chest pain and palpitations.  Gastrointestinal:  Positive for heartburn. Negative for abdominal pain, blood in stool, constipation, diarrhea, melena and vomiting.  Genitourinary:  Negative for dysuria and urgency.  Musculoskeletal:  Negative for joint pain and myalgias.  Skin:  Negative for itching and rash.  Neurological:  Negative for dizziness and headaches.  Psychiatric/Behavioral:  Positive for memory loss. Negative for depression. The patient is not nervous/anxious.      Objective: BP 109/67   Pulse (!) 106   Temp (!) 97.3 F (36.3 C)   Ht _0  (1.626 m)   Wt 269 lb (122 kg)   LMP 07/11/2007 (Approximate)   BMI 46.17 kg/m  Physical Exam Constitutional:      Appearance: Normal appearance. She is obese.  HENT:     Head: Normocephalic and atraumatic.  Eyes:     Extraocular Movements: Extraocular movements intact.     Conjunctiva/sclera: Conjunctivae normal.  Cardiovascular:     Rate and Rhythm: Normal rate and regular rhythm.  Pulmonary:     Effort: Pulmonary effort is normal.     Breath sounds: Normal breath sounds.  Abdominal:     General: Bowel sounds are normal.     Palpations: Abdomen is soft.  Musculoskeletal:        General: No swelling. Normal range of motion.     Cervical back: Normal range of motion and neck supple.  Skin:    General: Skin is warm and dry.     Coloration: Skin is not jaundiced.  Neurological:     Mental Status: She is alert and oriented to person, place, and time.     Comments: Mild asterixis present   Psychiatric:        Mood and Affect: Mood normal.        Behavior: Behavior normal.      Assessment: *Decompensated cirrhosis-due to Yolanda [name], most recent MELD 8 *Hepatic encephalopathy-new decompensating event *Portal hypertension with mild ascites *Obesity *GERD-relatively well-controlled on omeprazole 20 mg daily, though having breakthrough night symptoms *Hypoalbuminemia  Plan: Patient cirrhosis due to Yolanda [name], starkly well controlled though this year now suffering her first decompensating event with hepatic encephalopathy.  Also MRI with evidence of ascites.  Discussed hepatic encephalopathy in depth with patient today.  Continue on lactulose.  Titrate up for 2-3 loose bowel movements daily.  Continue on Xifaxan twice daily.  MRI 01/08/2022 with findings of cirrhosis, negative for hepatoma, for small volume ascites.  aFP WNL 01/26/2022.  EGD 10/28/2019 showed no evidence of esophageal varices.  Given decompensating event, will need to repeat this.  Hold off for now.  Discuss further on follow-up visit.  Counseled on high-protein low-sodium diet.  Discussed her very low albumin today.  Her daughter is vegetarian and does not eat meat.  Recommended implementing protein shakes/bars twice daily.  Protein snack before bed.  Continue  omeprazole for chronic GERD.  We will refer patient to [facility] in [place] per patient's request.  Appreciate her help immensely.  In regards to her hypervolemia, will start on spironolactone 50 mg daily.  Will need BMP in 1 week.  Patient told to hold olmesartan/HCTZ for now.  Monitor blood pressure and weights at home  Follow-up in 6 weeks.  03/23/2022 10:22 AM   Disclaimer: This note was dictated with voice recognition software. Similar sounding words can inadvertently be transcribed and may not be corrected upon review.

## 2022-03-27 ENCOUNTER — Telehealth: Payer: Self-pay

## 2022-03-27 NOTE — Telephone Encounter (Signed)
Refill request received from [facility] for Lactulose 10GM/15 SOL Qty: 946. Pt was last seen on 03/23/2022.

## 2022-03-28 ENCOUNTER — Encounter: Payer: Self-pay | Admitting: Nutrition

## 2022-03-28 NOTE — Telephone Encounter (Signed)
Dr Abbey Chatters Pt called again today regarding a refill on Lactulose. Pt last seen on 03/23/2022. Please advise

## 2022-03-29 ENCOUNTER — Other Ambulatory Visit: Payer: Self-pay | Admitting: Internal Medicine

## 2022-03-29 MED ORDER — LACTULOSE ENCEPHALOPATHY 10 GM/15ML PO SOLN: 20.0000 g | Freq: Every day | ORAL | 5 refills | Status: DC

## 2022-03-29 NOTE — Telephone Encounter (Signed)
Phoned and advised the pt of her Rx being sent in.

## 2022-03-29 NOTE — Telephone Encounter (Signed)
Refill sent to pharmacy.   

## 2022-03-31 LAB — COMPREHENSIVE METABOLIC PANEL
ALT: 17 IU/L (ref 0–32)
AST: 45 IU/L — ABNORMAL HIGH (ref 0–40)
Albumin/Globulin Ratio: 0.2 — ABNORMAL LOW (ref 1.2–2.2)
Albumin: 1.4 g/dL — CL (ref 3.9–4.9)
Alkaline Phosphatase: 105 IU/L (ref 44–121)
BUN/Creatinine Ratio: 12 (ref 12–28)
BUN: 6 mg/dL — ABNORMAL LOW (ref 8–27)
Bilirubin Total: 0.9 mg/dL (ref 0.0–1.2)
CO2: 29 mmol/L (ref 20–29)
Calcium: 7.6 mg/dL — ABNORMAL LOW (ref 8.7–10.3)
Chloride: 100 mmol/L (ref 96–106)
Creatinine, Ser: 0.5 mg/dL — ABNORMAL LOW (ref 0.57–1.00)
Globulin, Total: 7.2 g/dL — ABNORMAL HIGH (ref 1.5–4.5)
Glucose: 100 mg/dL — ABNORMAL HIGH (ref 70–99)
Potassium: 3.5 mmol/L (ref 3.5–5.2)
Sodium: 135 mmol/L (ref 134–144)
Total Protein: 8.6 g/dL — ABNORMAL HIGH (ref 6.0–8.5)
eGFR: 103 mL/min/{1.73_m2} (ref >59)

## 2022-04-18 NOTE — Progress Notes (Deleted)
GI Office Note    Referring Provider: Celene Squibb, MD Primary Care Physician:  Celene Squibb, MD  Primary Gastroenterologist: Elon Alas. Abbey Chatters, DO   Chief Complaint   No chief complaint on file.   History of Present Illness   Yolanda [name] is a 66 y.o. female presenting today for follow up. 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 Complicated by hepatic encephalopathy (HE) recently. Now on lactulose and Xifaxan.   Unfortunately decompensation this year. Admitted to [facility] 11/2021 with HE. Underwent significant work-up including CT head, MRI brain, carotid artery Dopplers, WNL.  Has follow-up with neurology for EEG.  Ammonia was elevated.  Started on lactulose therapy and patient clinically improved subsequently discharged.  Did have UTI treated with Rocephin discharged on Keflex.  At last ov, started on spironolactone 19m daily for edema. Told to hold olmesartan/HCTZ for now. Monitor BP and weights at home.    EGD 10/28/2019 showed no evidence of esophageal varices.  Colonoscopy at the same time did show multiple polyps which were tubular adenomas on pathology.   Ultrasound 12/08/2021 showed cirrhosis, no hepatoma there was a poor study given her body habitus.  Recommended MRI to further evaluate.   MRI 01/08/2022 with findings of cirrhosis, negative for hepatoma, for small volume ascites.  Medications   Current Outpatient Medications  Medication Sig Dispense Refill   albuterol (VENTOLIN HFA) 108 (90 Base) MCG/ACT inhaler Inhale 1-2 puffs into the lungs every 6 (six) hours as needed for wheezing or shortness of breath. 6.7 g 0   bisacodyl (BISACODYL) 5 MG EC tablet Take 10 mg by mouth daily as needed. (Patient not taking: Reported on 03/21/2022)     blood glucose meter kit and supplies Dispense based on patient and insurance preference. Use up to four times daily as directed. (FOR ICD-10 E10.9, E11.9). 1 each 0   budesonide-formoterol (SYMBICORT) 80-4.5 MCG/ACT inhaler  Take 2 puffs first thing in am and then another 2 puffs about 12 hours later. 1 each 12   Cholecalciferol (VITAMIN D3) 2000 units TABS Take 2,000 Units by mouth daily.      clotrimazole (MYCELEX) 10 MG troche Take 10 mg by mouth 5 (five) times daily. (Patient not taking: Reported on 03/23/2022)     CRANBERRY PO Take 500 mg by mouth daily. (Patient not taking: Reported on 03/23/2022)     DULoxetine (CYMBALTA) 60 MG capsule Take 60 mg by mouth daily.     HYDROcodone-acetaminophen (NORCO/VICODIN) 5-325 MG tablet Take 1 tablet by mouth every 4 (four) hours as needed for moderate pain. (Patient not taking: Reported on 03/23/2022) 16 tablet 0   inFLIXimab (REMICADE IV) Inject into the vein. Q three months per patient.     lactulose, encephalopathy, (CHRONULAC) 10 GM/15ML SOLN Take 30 mLs (20 g total) by mouth daily. Ok to take additional dose if No BM in 24 hrs 946 mL 5   Levothyroxine Sodium 100 MCG CAPS Take 75 mcg by mouth daily before breakfast.     meclizine (ANTIVERT) 25 MG tablet Take 25 mg by mouth 3 (three) times daily as needed for dizziness.     Melatonin 10 MG CAPS Take 10 mg by mouth at bedtime as needed (sleep).     metFORMIN (GLUCOPHAGE-XR) 500 MG 24 hr tablet 1 po with supper x 1 week, then 1 po BID WC x 1 week, then 2 po BID WC (Patient taking differently: Take 500 mg by mouth 2 (two) times daily.) 120 tablet  1   olmesartan-hydrochlorothiazide (BENICAR HCT) 40-12.5 MG tablet Take 1 tablet by mouth daily. 30 tablet 3   omeprazole (PRILOSEC) 20 MG capsule Take 1 capsule (20 mg total) by mouth 2 (two) times daily before a meal. 1 po 30 mins prior to [place]. (Patient taking differently: Take 20 mg by mouth 2 (two) times daily as needed (gerd).) 180 capsule 3   pilocarpine (SALAGEN) 5 MG tablet Take by mouth 2 (two) times daily.     rifaximin (XIFAXAN) 550 MG TABS tablet Take 550 mg by mouth.     rosuvastatin (CRESTOR) 10 MG tablet Take 10 mg by mouth daily.     spironolactone (ALDACTONE) 50  MG tablet Take 1 tablet (50 mg total) by mouth daily. 30 tablet 11   No current facility-administered medications for this visit.    Allergies   Allergies as of 04/19/2022 - Review Complete 03/28/2022  Allergen Reaction Noted   Azithromycin Hives    Lisinopril Cough 04/19/2017   Sulfonamide derivatives Other (See Comments)      Past Medical History   Past Medical History:  Diagnosis Date   Abnormal uterine bleeding    Anxiety    Arthritis    Chronic abdominal pain    Chronic pain in left foot    Depression    Elevated liver function tests 2018   Endometriosis    Fibromyalgia    GERD 12/21/2009   Qualifier: Diagnosis of  By: Craige Cotta     Hypertension    stopped meds in Aug 2015   Hypothyroidism    IBS (irritable bowel syndrome)    Internal hemorrhoids    Kidney stone    Liver cirrhosis secondary to NASH (HCC)    Obesity, morbid (HCC)    PONV (postoperative nausea and vomiting)    Psoriatic arthritis ([place])    Pulmonary embolism ([place]) 12/2005   Qualifier: Diagnosis of  By: Kellie Simmering LPN, Almyra Free     Rheumatoid arthritis [facility])    Sleep apnea    Vitamin D deficiency     Past Surgical History   Past Surgical History:  Procedure Laterality Date   ABDOMINAL SURGERY     laparoscopy   BIOPSY N/A 03/24/2014   Procedure: GASTRIC BIOPSIES;  Surgeon: Danie Binder, MD;  Location: [facility];  Service: Endoscopy;  Laterality: N/A;   BIOPSY  10/28/2019   Procedure: BIOPSY;  Surgeon: Danie Binder, MD;  Location: [facility];  Service: Endoscopy;;  gastric nodule   BREAST LUMPECTOMY Right    CESAREAN SECTION     X2   COLONOSCOPY  2006   internal hemorrhoids   COLONOSCOPY WITH PROPOFOL N/A 03/24/2014   Dr. [name]: 2 tubular adenomas removed, hemorrhoids   COLONOSCOPY WITH PROPOFOL N/A 10/28/2019   Fields: External and internal hemorrhoids, 8 polyps ranging from 2 to 5 mm in size removed from the colon.  Multiple tubular adenomas.  Next colonoscopy in 3 years.   CYSTOSCOPY  W/ RETROGRADES  01/23/2012   Procedure: CYSTOSCOPY WITH RETROGRADE PYELOGRAM;  Surgeon: Marissa Nestle, MD;  Location: [facility];  Service: Urology;  Laterality: Left;   DILATATION & CURETTAGE/HYSTEROSCOPY WITH MYOSURE N/A 11/18/2014   Procedure: DILATATION & CURETTAGE/HYSTEROSCOPY WITH MYOSURE, resection of polyp;  Surgeon: Cheri Fowler, MD;  Location: [facility];  Service: Gynecology;  Laterality: N/A;   DILATION AND CURETTAGE OF UTERUS     ESOPHAGOGASTRODUODENOSCOPY   08/24/2006   Dr. Veto Kemps erythema of the antrum without erosion or ulcers/Otherwise, normal esophagus without  evidence of Barrett's path with chronic gastritis   ESOPHAGOGASTRODUODENOSCOPY (EGD) WITH PROPOFOL N/A 03/24/2014   Dr. [name]: gastritis   ESOPHAGOGASTRODUODENOSCOPY (EGD) WITH PROPOFOL N/A 10/28/2019   Fields: Esophagus appeared normal, empiric dilation due to history of dysphagia.  6 mm nodule seen in the gastric cardia.  Mild portal hypertensive gastropathy.  Nodule from the stomach biopsied and showed mild chronic gastritis, no H. pylori.   POLYPECTOMY N/A 03/24/2014   Procedure: POLYPECTOMY;  Surgeon: Danie Binder, MD;  Location: [facility];  Service: Endoscopy;  Laterality: N/A;   POLYPECTOMY  10/28/2019   Procedure: POLYPECTOMY;  Surgeon: Danie Binder, MD;  [facility];  Service: Endoscopy;;  hepatic flexure, ascending colon,sigmoid colon, rectal   REMOVAL OF STONES  01/23/2012   Procedure: REMOVAL OF STONES;  Surgeon: Marissa Nestle, MD;  Location: [facility];  Service: Urology;  Laterality: N/A;   SAVORY DILATION N/A 10/28/2019   Procedure: SAVORY DILATION;  Surgeon: Danie Binder, MD;  Location: [facility];  Service: Endoscopy;  Laterality: N/A;   TUBAL LIGATION      Past Family History   Family History  Problem Relation Age of Onset   Heart attack Mother        CABG   Stroke Mother    Hypertension Mother    Cancer Mother    Diabetes Mother    Heart attack Father        CABG    Hypertension Father    Mesothelioma Father    Heart attack Brother    Diabetes Brother    Depression Brother    Hyperlipidemia Brother    Diabetes Maternal Grandmother    Diabetes Maternal Grandfather    Cancer Paternal Grandmother    Diabetes Brother    Hyperlipidemia Brother    Wilson's disease Other        2 nieces, one died in her 26s   Colon cancer Neg Hx     Past Social History   Social History   Socioeconomic History   Marital status: Divorced    Spouse name: Not on file   Number of children: 2   Years of education: 20   Highest education level: Not on file  Occupational History   Occupation: Social research officer, government: [employer]  Tobacco Use   Smoking status: Former    Years: 20.00    Types: Cigarettes    Quit date: 10/24/1998    Years since quitting: 23.4   Smokeless tobacco: Never   Tobacco comments:    1 cig daily  Vaping Use   Vaping Use: Never used  Substance and Sexual Activity   Alcohol use: Not Currently    Comment: "no alcohol in years"   Drug use: No   Sexual activity: Never    Birth control/protection: Post-menopausal  Other Topics Concern   Not on file  Social History Narrative   Right handed   One story with a basement   Drinks caffeine   Social Determinants of Health   Financial Resource Strain: Not on file  Food Insecurity: Not on file  Transportation Needs: Not on file  Physical Activity: Not on file  Stress: Not on file  Social Connections: Not on file  Intimate Partner Violence: Not on file    Review of Systems   General: Negative for anorexia, weight loss, fever, chills, fatigue, weakness. ENT: Negative for hoarseness, difficulty swallowing , nasal congestion. CV: Negative for chest pain, angina, palpitations,  dyspnea on exertion, peripheral edema.  Respiratory: Negative for dyspnea at rest, dyspnea on exertion, cough, sputum, wheezing.  GI: See history of present illness. GU:  Negative for  dysuria, hematuria, urinary incontinence, urinary frequency, nocturnal urination.  Endo: Negative for unusual weight change.     Physical Exam   LMP 07/11/2007 (Approximate)    General: Well-nourished, well-developed in no acute distress.  Eyes: No icterus. Mouth: Oropharyngeal mucosa moist and pink , no lesions erythema or exudate. Lungs: Clear to auscultation bilaterally.  Heart: Regular rate and rhythm, no murmurs rubs or gallops.  Abdomen: Bowel sounds are normal, nontender, nondistended, no hepatosplenomegaly or masses,  no abdominal bruits or hernia , no rebound or guarding.  Rectal: ***  Extremities: No lower extremity edema. No clubbing or deformities. Neuro: Alert and oriented x 4   Skin: Warm and dry, no jaundice.   Psych: Alert and cooperative, normal mood and affect.  Labs   Lab Results  Component Value Date   CREATININE 0.50 (L) 03/30/2022   BUN 6 (L) 03/30/2022   NA 135 03/30/2022   K 3.5 03/30/2022   CL 100 03/30/2022   CO2 29 03/30/2022   Lab Results  Component Value Date   ALT 17 03/30/2022   AST 45 (H) 03/30/2022   ALKPHOS 105 03/30/2022   BILITOT 0.9 03/30/2022   Lab Results  Component Value Date   WBC 8.8 01/02/2022   HGB 13.2 01/02/2022   HCT 38.9 01/02/2022   MCV 97 01/02/2022   PLT 156 01/02/2022   Lab Results  Component Value Date   INR 1.3 (H) 09/20/2021   INR 1.1 07/08/2020   INR 1.1 12/01/2019   Lab Results  Component Value Date   VITAMINB12 447 12/07/2021   Lab Results  Component Value Date   FOLATE 13.8 12/07/2021  . Lab Results  Component Value Date   HGBA1C 5.4 12/07/2021    Imaging Studies   No results found.  Assessment       PLAN   ***   Laureen Ochs. Bobby Rumpf, [place], [facility]

## 2022-04-19 ENCOUNTER — Other Ambulatory Visit: Payer: Self-pay

## 2022-04-19 ENCOUNTER — Inpatient Hospital Stay (HOSPITAL_COMMUNITY)
Admission: EM | Admit: 2022-04-19 | Discharge: 2022-04-27 | DRG: 193 | Disposition: A | Discharge status: Home / Self Care | Payer: Medicare Other | Attending: Family Medicine | Admitting: Family Medicine

## 2022-04-19 ENCOUNTER — Telehealth: Payer: Self-pay

## 2022-04-19 ENCOUNTER — Telehealth: Payer: Self-pay | Admitting: Gastroenterology

## 2022-04-19 ENCOUNTER — Ambulatory Visit: Payer: Medicare Other | Admitting: Gastroenterology

## 2022-04-19 ENCOUNTER — Encounter (HOSPITAL_COMMUNITY): Payer: Self-pay | Admitting: Emergency Medicine

## 2022-04-19 ENCOUNTER — Emergency Department (HOSPITAL_COMMUNITY): Payer: Medicare Other

## 2022-04-19 DIAGNOSIS — Z79890 Hormone replacement therapy (postmenopausal): Secondary | ICD-10-CM

## 2022-04-19 DIAGNOSIS — B9689 Other specified bacterial agents as the cause of diseases classified elsewhere: Secondary | ICD-10-CM | POA: Diagnosis present

## 2022-04-19 DIAGNOSIS — Z7951 Long term (current) use of inhaled steroids: Secondary | ICD-10-CM

## 2022-04-19 DIAGNOSIS — Z881 Allergy status to other antibiotic agents status: Secondary | ICD-10-CM

## 2022-04-19 DIAGNOSIS — N3000 Acute cystitis without hematuria: Secondary | ICD-10-CM

## 2022-04-19 DIAGNOSIS — E876 Hypokalemia: Secondary | ICD-10-CM | POA: Diagnosis present

## 2022-04-19 DIAGNOSIS — E039 Hypothyroidism, unspecified: Secondary | ICD-10-CM | POA: Diagnosis present

## 2022-04-19 DIAGNOSIS — Z8719 Personal history of other diseases of the digestive system: Secondary | ICD-10-CM

## 2022-04-19 DIAGNOSIS — Z833 Family history of diabetes mellitus: Secondary | ICD-10-CM

## 2022-04-19 DIAGNOSIS — Z818 Family history of other mental and behavioral disorders: Secondary | ICD-10-CM

## 2022-04-19 DIAGNOSIS — I10 Essential (primary) hypertension: Secondary | ICD-10-CM | POA: Diagnosis present

## 2022-04-19 DIAGNOSIS — G4733 Obstructive sleep apnea (adult) (pediatric): Secondary | ICD-10-CM | POA: Diagnosis present

## 2022-04-19 DIAGNOSIS — Z87891 Personal history of nicotine dependence: Secondary | ICD-10-CM

## 2022-04-19 DIAGNOSIS — Z8249 Family history of ischemic heart disease and other diseases of the circulatory system: Secondary | ICD-10-CM

## 2022-04-19 DIAGNOSIS — Z885 Allergy status to narcotic agent status: Secondary | ICD-10-CM

## 2022-04-19 DIAGNOSIS — N390 Urinary tract infection, site not specified: Secondary | ICD-10-CM | POA: Diagnosis present

## 2022-04-19 DIAGNOSIS — Z882 Allergy status to sulfonamides status: Secondary | ICD-10-CM

## 2022-04-19 DIAGNOSIS — K219 Gastro-esophageal reflux disease without esophagitis: Secondary | ICD-10-CM | POA: Diagnosis present

## 2022-04-19 DIAGNOSIS — R4182 Altered mental status, unspecified: Secondary | ICD-10-CM | POA: Diagnosis not present

## 2022-04-19 DIAGNOSIS — I5033 Acute on chronic diastolic (congestive) heart failure: Secondary | ICD-10-CM | POA: Diagnosis not present

## 2022-04-19 DIAGNOSIS — M069 Rheumatoid arthritis, unspecified: Secondary | ICD-10-CM | POA: Diagnosis present

## 2022-04-19 DIAGNOSIS — G8929 Other chronic pain: Secondary | ICD-10-CM | POA: Diagnosis present

## 2022-04-19 DIAGNOSIS — J189 Pneumonia, unspecified organism: Principal | ICD-10-CM | POA: Diagnosis present

## 2022-04-19 DIAGNOSIS — Z6841 Body Mass Index (BMI) 40.0 and over, adult: Secondary | ICD-10-CM

## 2022-04-19 DIAGNOSIS — I110 Hypertensive heart disease with heart failure: Secondary | ICD-10-CM | POA: Diagnosis present

## 2022-04-19 DIAGNOSIS — D689 Coagulation defect, unspecified: Secondary | ICD-10-CM | POA: Diagnosis present

## 2022-04-19 DIAGNOSIS — E0590 Thyrotoxicosis, unspecified without thyrotoxic crisis or storm: Secondary | ICD-10-CM | POA: Diagnosis present

## 2022-04-19 DIAGNOSIS — E7220 Disorder of urea cycle metabolism, unspecified: Secondary | ICD-10-CM | POA: Diagnosis present

## 2022-04-19 DIAGNOSIS — Z7984 Long term (current) use of oral hypoglycemic drugs: Secondary | ICD-10-CM

## 2022-04-19 DIAGNOSIS — D6959 Other secondary thrombocytopenia: Secondary | ICD-10-CM | POA: Diagnosis present

## 2022-04-19 DIAGNOSIS — N3941 Urge incontinence: Secondary | ICD-10-CM | POA: Diagnosis present

## 2022-04-19 DIAGNOSIS — K766 Portal hypertension: Secondary | ICD-10-CM | POA: Diagnosis present

## 2022-04-19 DIAGNOSIS — Z79899 Other long term (current) drug therapy: Secondary | ICD-10-CM

## 2022-04-19 DIAGNOSIS — K7581 Nonalcoholic steatohepatitis (NASH): Secondary | ICD-10-CM | POA: Diagnosis present

## 2022-04-19 DIAGNOSIS — Z809 Family history of malignant neoplasm, unspecified: Secondary | ICD-10-CM

## 2022-04-19 DIAGNOSIS — L4050 Arthropathic psoriasis, unspecified: Secondary | ICD-10-CM | POA: Diagnosis present

## 2022-04-19 DIAGNOSIS — J45909 Unspecified asthma, uncomplicated: Secondary | ICD-10-CM | POA: Diagnosis present

## 2022-04-19 DIAGNOSIS — R531 Weakness: Secondary | ICD-10-CM

## 2022-04-19 DIAGNOSIS — E872 Acidosis, unspecified: Secondary | ICD-10-CM | POA: Diagnosis present

## 2022-04-19 DIAGNOSIS — Z7982 Long term (current) use of aspirin: Secondary | ICD-10-CM

## 2022-04-19 DIAGNOSIS — G4730 Sleep apnea, unspecified: Secondary | ICD-10-CM | POA: Diagnosis present

## 2022-04-19 DIAGNOSIS — E440 Moderate protein-calorie malnutrition: Secondary | ICD-10-CM | POA: Diagnosis not present

## 2022-04-19 DIAGNOSIS — M1990 Unspecified osteoarthritis, unspecified site: Secondary | ICD-10-CM | POA: Diagnosis present

## 2022-04-19 DIAGNOSIS — E873 Alkalosis: Secondary | ICD-10-CM | POA: Diagnosis not present

## 2022-04-19 DIAGNOSIS — Z8349 Family history of other endocrine, nutritional and metabolic diseases: Secondary | ICD-10-CM

## 2022-04-19 DIAGNOSIS — E8809 Other disorders of plasma-protein metabolism, not elsewhere classified: Secondary | ICD-10-CM | POA: Diagnosis present

## 2022-04-19 DIAGNOSIS — Z86711 Personal history of pulmonary embolism: Secondary | ICD-10-CM

## 2022-04-19 DIAGNOSIS — K567 Ileus, unspecified: Secondary | ICD-10-CM | POA: Diagnosis not present

## 2022-04-19 DIAGNOSIS — Z9981 Dependence on supplemental oxygen: Secondary | ICD-10-CM

## 2022-04-19 DIAGNOSIS — K7460 Unspecified cirrhosis of liver: Secondary | ICD-10-CM | POA: Diagnosis present

## 2022-04-19 DIAGNOSIS — I480 Paroxysmal atrial fibrillation: Secondary | ICD-10-CM | POA: Diagnosis present

## 2022-04-19 DIAGNOSIS — F419 Anxiety disorder, unspecified: Secondary | ICD-10-CM | POA: Diagnosis present

## 2022-04-19 DIAGNOSIS — E1169 Type 2 diabetes mellitus with other specified complication: Secondary | ICD-10-CM | POA: Diagnosis present

## 2022-04-19 DIAGNOSIS — G9341 Metabolic encephalopathy: Secondary | ICD-10-CM | POA: Diagnosis present

## 2022-04-19 DIAGNOSIS — D649 Anemia, unspecified: Secondary | ICD-10-CM

## 2022-04-19 DIAGNOSIS — E871 Hypo-osmolality and hyponatremia: Secondary | ICD-10-CM | POA: Diagnosis present

## 2022-04-19 DIAGNOSIS — M797 Fibromyalgia: Secondary | ICD-10-CM | POA: Diagnosis present

## 2022-04-19 DIAGNOSIS — K7682 Hepatic encephalopathy: Secondary | ICD-10-CM | POA: Diagnosis present

## 2022-04-19 DIAGNOSIS — F32A Depression, unspecified: Secondary | ICD-10-CM | POA: Diagnosis present

## 2022-04-19 DIAGNOSIS — E782 Mixed hyperlipidemia: Secondary | ICD-10-CM | POA: Diagnosis present

## 2022-04-19 DIAGNOSIS — M79672 Pain in left foot: Secondary | ICD-10-CM | POA: Diagnosis present

## 2022-04-19 DIAGNOSIS — Z823 Family history of stroke: Secondary | ICD-10-CM

## 2022-04-19 DIAGNOSIS — J9611 Chronic respiratory failure with hypoxia: Secondary | ICD-10-CM | POA: Diagnosis present

## 2022-04-19 LAB — URINALYSIS, ROUTINE W REFLEX MICROSCOPIC
Bilirubin Urine: NEGATIVE
Glucose, UA: NEGATIVE mg/dL
Ketones, ur: NEGATIVE mg/dL
Nitrite: POSITIVE — AB
Protein, ur: 30 mg/dL — AB
Specific Gravity, Urine: 1.012 (ref 1.005–1.030)
WBC, UA: >50 WBC/hpf — ABNORMAL HIGH (ref 0–5)
pH: 6 (ref 5.0–8.0)

## 2022-04-19 LAB — RAPID URINE DRUG SCREEN, HOSP PERFORMED
Amphetamines: NOT DETECTED
Barbiturates: NOT DETECTED
Benzodiazepines: NOT DETECTED
Cocaine: NOT DETECTED
Opiates: NOT DETECTED
Tetrahydrocannabinol: NOT DETECTED

## 2022-04-19 LAB — BLOOD GAS, VENOUS
Acid-Base Excess: 7.4 mmol/L — ABNORMAL HIGH (ref 0.0–2.0)
Bicarbonate: 31.2 mmol/L — ABNORMAL HIGH (ref 20.0–28.0)
Drawn by: 66297
O2 Saturation: 90.2 %
Patient temperature: 36.9
pCO2, Ven: 40 mmHg — ABNORMAL LOW (ref 44–60)
pH, Ven: 7.5 — ABNORMAL HIGH (ref 7.25–7.43)
pO2, Ven: 53 mmHg — ABNORMAL HIGH (ref 32–45)

## 2022-04-19 LAB — CBC
HCT: 28.7 % — ABNORMAL LOW (ref 36.0–46.0)
Hemoglobin: 9.3 g/dL — ABNORMAL LOW (ref 12.0–15.0)
MCH: 31.3 pg (ref 26.0–34.0)
MCHC: 32.4 g/dL (ref 30.0–36.0)
MCV: 96.6 fL (ref 80.0–100.0)
Platelets: 116 10*3/uL — ABNORMAL LOW (ref 150–400)
RBC: 2.97 MIL/uL — ABNORMAL LOW (ref 3.87–5.11)
RDW: 15.6 % — ABNORMAL HIGH (ref 11.5–15.5)
WBC: 12.1 10*3/uL — ABNORMAL HIGH (ref 4.0–10.5)
nRBC: 0 % (ref 0.0–0.2)

## 2022-04-19 LAB — I-STAT CHEM 8, ED
BUN: 7 mg/dL — ABNORMAL LOW (ref 8–23)
Calcium, Ion: 1.06 mmol/L — ABNORMAL LOW (ref 1.15–1.40)
Chloride: 97 mmol/L — ABNORMAL LOW (ref 98–111)
Creatinine, Ser: 0.5 mg/dL (ref 0.44–1.00)
Glucose, Bld: 92 mg/dL (ref 70–99)
HCT: 29 % — ABNORMAL LOW (ref 36.0–46.0)
Hemoglobin: 9.9 g/dL — ABNORMAL LOW (ref 12.0–15.0)
Potassium: 3.1 mmol/L — ABNORMAL LOW (ref 3.5–5.1)
Sodium: 140 mmol/L (ref 135–145)
TCO2: 29 mmol/L (ref 22–32)

## 2022-04-19 LAB — COMPREHENSIVE METABOLIC PANEL
ALT: 25 U/L (ref 0–44)
AST: 60 U/L — ABNORMAL HIGH (ref 15–41)
Albumin: <1.5 g/dL — ABNORMAL LOW (ref 3.5–5.0)
Alkaline Phosphatase: 88 U/L (ref 38–126)
Anion gap: 3 — ABNORMAL LOW (ref 5–15)
BUN: 9 mg/dL (ref 8–23)
CO2: 28 mmol/L (ref 22–32)
Calcium: 7.5 mg/dL — ABNORMAL LOW (ref 8.9–10.3)
Chloride: 102 mmol/L (ref 98–111)
Creatinine, Ser: 0.65 mg/dL (ref 0.44–1.00)
GFR, Estimated: >60 mL/min (ref >60)
Glucose, Bld: 93 mg/dL (ref 70–99)
Potassium: 2.9 mmol/L — ABNORMAL LOW (ref 3.5–5.1)
Sodium: 133 mmol/L — ABNORMAL LOW (ref 135–145)
Total Bilirubin: 1.8 mg/dL — ABNORMAL HIGH (ref 0.3–1.2)
Total Protein: 9.2 g/dL — ABNORMAL HIGH (ref 6.5–8.1)

## 2022-04-19 LAB — AMMONIA: Ammonia: 58 umol/L — ABNORMAL HIGH (ref 9–35)

## 2022-04-19 LAB — CBG MONITORING, ED: Glucose-Capillary: 94 mg/dL (ref 70–99)

## 2022-04-19 LAB — TSH: TSH: 3.655 u[IU]/mL (ref 0.350–4.500)

## 2022-04-19 LAB — BRAIN NATRIURETIC PEPTIDE: B Natriuretic Peptide: 81 pg/mL (ref 0.0–100.0)

## 2022-04-19 LAB — POC OCCULT BLOOD, ED: Fecal Occult Bld: NEGATIVE

## 2022-04-19 LAB — LACTIC ACID, PLASMA
Lactic Acid, Venous: 2.5 mmol/L (ref 0.5–1.9)
Lactic Acid, Venous: 1.3 mmol/L (ref 0.5–1.9)

## 2022-04-19 LAB — LIPASE, BLOOD: Lipase: 35 U/L (ref 11–51)

## 2022-04-19 MED ORDER — POTASSIUM CHLORIDE CRYS ER 20 MEQ PO TBCR
40.0000 meq | EXTENDED_RELEASE_TABLET | Freq: Once | ORAL | Status: AC
  Administered 2022-04-19: 40 meq via ORAL
  Filled 2022-04-19: qty 2

## 2022-04-19 MED ORDER — ONDANSETRON HCL 4 MG/2ML IJ SOLN
4.0000 mg | Freq: Four times a day (QID) | INTRAMUSCULAR | Status: DC | PRN
  Administered 2022-04-24 – 2022-04-25 (×3): 4 mg via INTRAVENOUS
  Filled 2022-04-19 (×3): qty 2

## 2022-04-19 MED ORDER — HYDROCHLOROTHIAZIDE 12.5 MG PO TABS
12.5000 mg | ORAL_TABLET | Freq: Every day | ORAL | Status: DC
  Administered 2022-04-20: 12.5 mg via ORAL
  Filled 2022-04-19: qty 1

## 2022-04-19 MED ORDER — ASPIRIN 81 MG PO TBEC
81.0000 mg | DELAYED_RELEASE_TABLET | Freq: Every day | ORAL | Status: DC
  Administered 2022-04-20 – 2022-04-27 (×8): 81 mg via ORAL
  Filled 2022-04-19 (×8): qty 1

## 2022-04-19 MED ORDER — HEPARIN SODIUM (PORCINE) 5000 UNIT/ML IJ SOLN
5000.0000 [IU] | Freq: Three times a day (TID) | INTRAMUSCULAR | Status: DC
  Administered 2022-04-19 – 2022-04-27 (×21): 5000 [IU] via SUBCUTANEOUS
  Filled 2022-04-19 (×23): qty 1

## 2022-04-19 MED ORDER — LEVOTHYROXINE SODIUM 50 MCG PO TABS: 75.0000 ug | ORAL_TABLET | Freq: Every day | ORAL | Status: DC

## 2022-04-19 MED ORDER — IRBESARTAN 150 MG PO TABS
300.0000 mg | ORAL_TABLET | Freq: Every day | ORAL | Status: DC
  Administered 2022-04-20: 300 mg via ORAL
  Filled 2022-04-19: qty 2

## 2022-04-19 MED ORDER — LEVOTHYROXINE SODIUM 75 MCG PO TABS
75.0000 ug | ORAL_TABLET | Freq: Every day | ORAL | Status: DC
  Administered 2022-04-20 – 2022-04-27 (×7): 75 ug via ORAL
  Filled 2022-04-19 (×8): qty 1

## 2022-04-19 MED ORDER — SODIUM CHLORIDE 0.9 % IV SOLN
1.0000 g | Freq: Once | INTRAVENOUS | Status: AC
  Administered 2022-04-19: 1 g via INTRAVENOUS
  Filled 2022-04-19: qty 10

## 2022-04-19 MED ORDER — DULOXETINE HCL 60 MG PO CPEP
60.0000 mg | ORAL_CAPSULE | Freq: Every day | ORAL | Status: DC
  Administered 2022-04-20 – 2022-04-27 (×8): 60 mg via ORAL
  Filled 2022-04-19 (×6): qty 1
  Filled 2022-04-19: qty 2
  Filled 2022-04-19 (×2): qty 1

## 2022-04-19 MED ORDER — DOXYCYCLINE HYCLATE 100 MG PO TABS
100.0000 mg | ORAL_TABLET | Freq: Once | ORAL | Status: AC
  Administered 2022-04-19: 100 mg via ORAL
  Filled 2022-04-19: qty 1

## 2022-04-19 MED ORDER — MOMETASONE FURO-FORMOTEROL FUM 100-5 MCG/ACT IN AERO
2.0000 | INHALATION_SPRAY | Freq: Two times a day (BID) | RESPIRATORY_TRACT | Status: DC
  Administered 2022-04-19 – 2022-04-27 (×16): 2 via RESPIRATORY_TRACT
  Filled 2022-04-19 (×3): qty 8.8

## 2022-04-19 MED ORDER — SPIRONOLACTONE 25 MG PO TABS
50.0000 mg | ORAL_TABLET | Freq: Every day | ORAL | Status: DC
  Administered 2022-04-20: 50 mg via ORAL
  Filled 2022-04-19 (×2): qty 2

## 2022-04-19 MED ORDER — PANTOPRAZOLE SODIUM 40 MG PO TBEC
40.0000 mg | DELAYED_RELEASE_TABLET | Freq: Every day | ORAL | Status: DC
  Administered 2022-04-20 – 2022-04-27 (×8): 40 mg via ORAL
  Filled 2022-04-19 (×8): qty 1

## 2022-04-19 MED ORDER — RIFAXIMIN 550 MG PO TABS
550.0000 mg | ORAL_TABLET | Freq: Two times a day (BID) | ORAL | Status: DC
  Administered 2022-04-19 – 2022-04-23 (×8): 550 mg via ORAL
  Filled 2022-04-19 (×8): qty 1

## 2022-04-19 MED ORDER — OXYCODONE HCL 5 MG PO TABS
5.0000 mg | ORAL_TABLET | ORAL | Status: DC | PRN
  Filled 2022-04-19: qty 1

## 2022-04-19 MED ORDER — ACETAMINOPHEN 650 MG RE SUPP: 650.0000 mg | Freq: Four times a day (QID) | RECTAL | Status: DC | PRN

## 2022-04-19 MED ORDER — OLMESARTAN MEDOXOMIL-HCTZ 40-12.5 MG PO TABS: 1.0000 | ORAL_TABLET | Freq: Every day | ORAL | Status: DC

## 2022-04-19 MED ORDER — ONDANSETRON HCL 4 MG PO TABS
4.0000 mg | ORAL_TABLET | Freq: Four times a day (QID) | ORAL | Status: DC | PRN
  Filled 2022-04-19: qty 1

## 2022-04-19 MED ORDER — SODIUM CHLORIDE 0.9 % IV BOLUS
500.0000 mL | Freq: Once | INTRAVENOUS | Status: AC
  Administered 2022-04-19: 500 mL via INTRAVENOUS

## 2022-04-19 MED ORDER — ROSUVASTATIN CALCIUM 10 MG PO TABS
10.0000 mg | ORAL_TABLET | Freq: Every day | ORAL | Status: DC
  Administered 2022-04-20 – 2022-04-23 (×4): 10 mg via ORAL
  Filled 2022-04-19 (×4): qty 1

## 2022-04-19 MED ORDER — ACETAMINOPHEN 325 MG PO TABS
650.0000 mg | ORAL_TABLET | Freq: Four times a day (QID) | ORAL | Status: DC | PRN
  Administered 2022-04-20: 650 mg via ORAL
  Filled 2022-04-19: qty 2

## 2022-04-19 MED ORDER — LACTULOSE 10 GM/15ML PO SOLN
20.0000 g | Freq: Every day | ORAL | Status: DC
  Administered 2022-04-19 – 2022-04-20 (×2): 20 g via ORAL
  Filled 2022-04-19 (×2): qty 30

## 2022-04-19 NOTE — ED Triage Notes (Signed)
Daughter reports increased confusion, fatigue and difficulty ambulating x 1 wk. Pt states "I feel drunk." Hx of hepatic encephalopathy. Pt non compliant w/meds per daughter last couple weeks. Pt states "I do not mind anything but the last couple weeks." On 3L per [place] chronically.

## 2022-04-19 NOTE — Telephone Encounter (Signed)
I marked the patient as a no show but the daughter had left a message to cancel the appiontment because her mom wasn't feeling well.

## 2022-04-19 NOTE — ED Notes (Signed)
Patient transported to MRI 

## 2022-04-19 NOTE — Telephone Encounter (Signed)
FYI Dr Abbey Chatters Leslie---pt canceled with you today.   Pt's daughter left a 4 minute message on my vm regarding her mother's symptoms today. She was unable to get and keep her mother awake to even hold the phone to cancel her appt this morning. States her mother hasn't taken her noon or night time medications in 5 days. She is very confused and disorientated. She is having trouble sitting up. I advised her to call EMS to take her mother to the ED to be evaluated and to take her meds with them.

## 2022-04-19 NOTE — ED Provider Notes (Signed)
[facility] EMERGENCY DEPARTMENT Provider Note   CSN: [id number] Arrival date & time: 04/19/22  1622    History  Chief Complaint  Patient presents with   Altered Mental Status    Yolanda [name] is a 66 y.o. female hx of [name] cirrhosis, chronic respiratory failure with hypoxia, hypertension, hyperlipidemia, hypoalbuminemia, recurrent hepatic encephalopathy here for evaluation of altered mental status.  Daughter states she has had overall progressive decline since prior hospitalization however worse over the last week.  Daughter states patient has reportedly been compliant with her home medications however she found out today after opening up her pill container she is not taking her medications over the last week.  She states she does not like taking her lactulose due to it causing loose stool.  Daughter has noted increased confusion.  She does not know what day of the week or what year it is most of the time.  Today she noted patient to be sleeping greater than 20 hours/day over the last week.  She is no known injury or trauma.  Her states she has had a cough however this is not completely abnormal.  No increase in home oxygen.  No lower extremity swelling.  No PND orthopnea.  Daughter states she is not sleeping at night due to her multiple episodes of having to get up frequently to urinate.  She has had overall decreased appetite, eating and drinking at home.  No fever.  Daughter did state last week the whole family had a "GI bug." On 3 L [place] at baseline  No varices on prior EGD  HPI     Home Medications Prior to Admission medications   Medication Sig Start Date End Date Taking? Authorizing Provider  albuterol (VENTOLIN HFA) 108 (90 Base) MCG/ACT inhaler Inhale 1-2 puffs into the lungs every 6 (six) hours as needed for wheezing or shortness of breath. 11/08/19  Yes Long, Wonda Olds, MD  aspirin EC 81 MG tablet Take 81 mg by mouth daily. Swallow whole.   Yes [provider]   budesonide-formoterol (SYMBICORT) 80-4.5 MCG/ACT inhaler Take 2 puffs first thing in am and then another 2 puffs about 12 hours later. 11/25/21  Yes Tanda Rockers, MD  Calcium Carb-Cholecalciferol (CALCIUM 600 + D PO) Take by mouth.   Yes [provider]  Cholecalciferol (VITAMIN D3) 2000 units TABS Take 2,000 Units by mouth daily.    Yes [provider]  clotrimazole (MYCELEX) 10 MG troche Take 10 mg by mouth 5 (five) times daily.   Yes [provider]  DULoxetine (CYMBALTA) 60 MG capsule Take 60 mg by mouth daily. 07/15/21  Yes [provider]  inFLIXimab (REMICADE IV) Inject into the vein. Q three months per patient.   Yes [provider]  lactulose, encephalopathy, (CHRONULAC) 10 GM/15ML SOLN Take 30 mLs (20 g total) by mouth daily. Ok to take additional dose if No BM in 24 hrs 03/29/22  Yes Carver, Elon Alas, DO  levothyroxine (SYNTHROID) 75 MCG tablet Take 1 tablet by mouth daily. 12/26/21  Yes [provider]  meclizine (ANTIVERT) 25 MG tablet Take 25 mg by mouth 3 (three) times daily as needed for dizziness.   Yes [provider]  Melatonin 10 MG CAPS Take 10 mg by mouth at bedtime as needed (sleep).   Yes [provider]  metFORMIN (GLUCOPHAGE-XR) 500 MG 24 hr tablet 1 po with supper x 1 week, then 1 po BID WC x 1 week, then 2 po BID  WC Patient taking differently: Take 500 mg by mouth daily as needed (sugar). 09/22/21  Yes Johnson, Clanford L, MD  olmesartan-hydrochlorothiazide (BENICAR HCT) 40-12.5 MG tablet Take 1 tablet by mouth daily. 12/07/21  Yes Emokpae, Courage, MD  omeprazole (PRILOSEC) 20 MG capsule Take 1 capsule (20 mg total) by mouth 2 (two) times daily before a meal. 1 po 30 mins prior to [place]. Patient taking differently: Take 20 mg by mouth 2 (two) times daily as needed (gerd). 03/09/21 04/19/22 Yes Carver, Charles K, DO  pantoprazole (PROTONIX) 40 MG tablet Take 40 mg by mouth 2 (two) times daily.   Yes  [provider]  pilocarpine (SALAGEN) 5 MG tablet Take 5 mg by mouth 2 (two) times daily.   Yes [provider]  rifaximin (XIFAXAN) 550 MG TABS tablet Take 550 mg by mouth 2 (two) times daily.   Yes [provider]  rosuvastatin (CRESTOR) 10 MG tablet Take 10 mg by mouth daily.   Yes [provider]  spironolactone (ALDACTONE) 50 MG tablet Take 1 tablet (50 mg total) by mouth daily. 03/23/22 03/23/23 Yes Carver, Elon Alas, DO  sucralfate (CARAFATE) 1 g tablet Take 1 g by mouth 4 (four) times daily. 01/25/22  Yes [provider]  blood glucose meter kit and supplies Dispense based on patient and insurance preference. Use up to four times daily as directed. (FOR ICD-10 E10.9, E11.9). 09/22/21   Johnson, Clanford L, MD  HYDROcodone-acetaminophen (NORCO/VICODIN) 5-325 MG tablet Take 1 tablet by mouth every 4 (four) hours as needed for moderate pain. Patient not taking: Reported on 03/23/2022 02/22/22 02/22/23  Fransico Meadow, PA-C      Allergies    Azithromycin, Lisinopril, Morphine, and Sulfonamide derivatives    Review of Systems   Review of Systems  Constitutional:  Positive for activity change, appetite change and fatigue.  HENT: Negative.    Respiratory:  Positive for cough. Negative for apnea, choking, chest tightness, shortness of breath, wheezing and stridor.   Cardiovascular: Negative.   Gastrointestinal:  Positive for nausea. Negative for abdominal distention, abdominal pain, anal bleeding, blood in stool, constipation, diarrhea, rectal pain and vomiting.  Genitourinary:  Positive for frequency and urgency. Negative for decreased urine volume, difficulty urinating, dysuria, enuresis, flank pain, genital sores, menstrual problem, pelvic pain, vaginal bleeding and vaginal pain.  Musculoskeletal:  Positive for gait problem (chronic). Negative for arthralgias, joint swelling, myalgias, neck pain and neck stiffness.  Neurological:  Positive for  weakness.  Psychiatric/Behavioral:  Positive for confusion.   All other systems reviewed and are negative.   Physical Exam Updated Vital Signs BP (!) 146/77   Pulse 96   Temp 98.7 F (37.1 C) (Oral)   Resp 18   Ht 5' 4"  (1.626 m)   Wt 124.7 kg   LMP 07/11/2007 (Approximate)   SpO2 95%   BMI 47.20 kg/m  Physical Exam Vitals and nursing note reviewed. Exam conducted with a chaperone present.  Constitutional:      General: She is not in acute distress.    Appearance: She is well-developed. She is ill-appearing (chronically ill appearing). She is not toxic-appearing.     Comments: Pleasantly confused  HENT:     Head: Normocephalic and atraumatic.     Mouth/Throat:     Mouth: Mucous membranes are dry.  Eyes:     Pupils: Pupils are equal, round, and reactive to light.  Cardiovascular:     Rate and Rhythm: Normal rate.     Pulses:  Normal pulses.     Heart sounds: Normal heart sounds.  Pulmonary:     Effort: Pulmonary effort is normal. No respiratory distress.     Breath sounds: Normal breath sounds.     Comments: Mild rhonchi at bases Abdominal:     General: Bowel sounds are normal. There is no distension.     Palpations: Abdomen is soft.     Tenderness: There is no abdominal tenderness. There is no right CVA tenderness, left CVA tenderness, guarding or rebound.     Comments: Soft non tender, mild distension  Genitourinary:    Comments: RN present, external hemorrhoid noted, light brown stool in rectal vault Musculoskeletal:        General: No swelling, tenderness, deformity or signs of injury. Normal range of motion.     Cervical back: Normal range of motion.     Right lower leg: No edema.     Left lower leg: No edema.     Comments: No bony tenderness, full range of motion  Skin:    General: Skin is warm and dry.     Comments: Chronic venous stasis skin changes to bilateral lower extremities, no obvious pitting edema.  Neurological:     Mental Status: She is alert.      Motor: Weakness present.     Comments: Alert to person, place, has to correct year to get to 2023 multiple times.  Follows commands however slowed.  Mild asterixis bilaterally  Psychiatric:        Mood and Affect: Mood normal.     ED Results / Procedures / Treatments   Labs (all labs ordered are listed, but only abnormal results are displayed) Labs Reviewed  COMPREHENSIVE METABOLIC PANEL - Abnormal; Notable for the following components:      Result Value   Sodium 133 (*)    Potassium 2.9 (*)    Calcium 7.5 (*)    Total Protein 9.2 (*)    Albumin <1.5 (*)    AST 60 (*)    Total Bilirubin 1.8 (*)    Anion gap 3 (*)    All other components within normal limits  CBC - Abnormal; Notable for the following components:   WBC 12.1 (*)    RBC 2.97 (*)    Hemoglobin 9.3 (*)    HCT 28.7 (*)    RDW 15.6 (*)    Platelets 116 (*)    All other components within normal limits  AMMONIA - Abnormal; Notable for the following components:   Ammonia 58 (*)    All other components within normal limits  LACTIC ACID, PLASMA - Abnormal; Notable for the following components:   Lactic Acid, Venous 2.5 (*)    All other components within normal limits  URINALYSIS, ROUTINE W REFLEX MICROSCOPIC - Abnormal; Notable for the following components:   Color, Urine AMBER (*)    APPearance HAZY (*)    Hgb urine dipstick MODERATE (*)    Protein, ur 30 (*)    Nitrite POSITIVE (*)    Leukocytes,Ua MODERATE (*)    WBC, UA >50 (*)    Bacteria, UA RARE (*)    All other components within normal limits  BLOOD GAS, VENOUS - Abnormal; Notable for the following components:   pH, Ven 7.5 (*)    pCO2, Ven 40 (*)    pO2, Ven 53 (*)    Bicarbonate 31.2 (*)    Acid-Base Excess 7.4 (*)    All other components within normal limits  I-STAT CHEM 8, ED - Abnormal; Notable for the following components:   Potassium 3.1 (*)    Chloride 97 (*)    BUN 7 (*)    Calcium, Ion 1.06 (*)    Hemoglobin 9.9 (*)    HCT 29.0 (*)     All other components within normal limits  CULTURE, BLOOD (ROUTINE X 2)  CULTURE, BLOOD (ROUTINE X 2)  LIPASE, BLOOD  LACTIC ACID, PLASMA  RAPID URINE DRUG SCREEN, HOSP PERFORMED  TSH  BRAIN NATRIURETIC PEPTIDE  VITAMIN B12  FOLATE  IRON AND TIBC  FERRITIN  RETICULOCYTES  CBG MONITORING, ED  POC OCCULT BLOOD, ED    EKG None  Radiology MR BRAIN WO CONTRAST  Result Date: 04/19/2022 CLINICAL DATA:  Delirium. EXAM: MRI HEAD WITHOUT CONTRAST TECHNIQUE: Multiplanar, multiecho pulse sequences of the brain and surrounding structures were obtained without intravenous contrast. COMPARISON:  Head MRI 12/07/2021 FINDINGS: Brain: There is no evidence of an acute infarct, intracranial hemorrhage, mass, midline shift, or extra-axial fluid collection. The ventricles and sulci are within normal limits for age. T2 hyperintensities in the cerebral white matter bilaterally are unchanged and nonspecific but compatible with mild chronic small vessel ischemic disease. Vascular: Major intracranial vascular flow voids are preserved. Skull and upper cervical spine: Unremarkable bone marrow signal para Sinuses/Orbits: Unremarkable orbits. Paranasal sinuses and mastoid air cells are clear. Other: None. IMPRESSION: 1. No acute intracranial abnormality. 2. Mild chronic small vessel ischemic disease. Electronically Signed   By: Logan Bores M.D.   On: 04/19/2022 19:07   DG Chest Portable 1 View  Result Date: 04/19/2022 CLINICAL DATA:  Altered mental status. EXAM: PORTABLE CHEST 1 VIEW COMPARISON:  Chest radiographs 01/02/2022 FINDINGS: The cardiac silhouette remains mildly enlarged. There is persistent pulmonary vascular congestion with increased interstitial densities bilaterally as well as new patchy airspace opacities in the right greater than left lower lungs. There are persistent small left and new small right pleural effusions. No pneumothorax is identified. No acute osseous abnormality is seen. IMPRESSION:  Cardiomegaly with small pleural effusions and mixed interstitial and air space opacities which may reflect edema versus pneumonia. Electronically Signed   By: Logan Bores M.D.   On: 04/19/2022 17:30    Procedures Procedures    Medications Ordered in ED Medications  cefTRIAXone (ROCEPHIN) 1 g in sodium chloride 0.9 % 100 mL IVPB (0 g Intravenous Stopped 04/19/22 1956)  doxycycline (VIBRA-TABS) tablet 100 mg (100 mg Oral Given 04/19/22 1912)  sodium chloride 0.9 % bolus 500 mL (500 mLs Intravenous New Bag/Given 04/19/22 1915)  potassium chloride SA (KLOR-CON M) CR tablet 40 mEq (40 mEq Oral Given 04/19/22 1913)    ED Course/ Medical Decision Making/ A&P    66 year old with multiple medical comorbidities here for evaluation of altered mental status.  Per daughter sounds like has had some degree of this for quite some time however worse over the last week.  However states patient has been off her medication.  More confused than normal.  Sleeping greater than 20 hours a day.  Does note some increased urinary frequency and urgency at night.  As well as a cough.  On exam she does seem mildly confused, takes a while to get to the correct date.  She does have some mild asterixis.  Mild coarse lung sounds at bases.  Obvious focal neurologic deficit.  We will plan on labs and imaging.  Will need admission  Labs and imaging personally viewed and interpreted:  CBC leukocytosis 12.1 Hemoglobin 9.3,  down from baseline by 4 points, neg occult Metabolic panel hypokalemia at 2.9, low albumin, AST 60, T. bili 1.8 fits with history of cirrhosis Ammonia 58 Lipase 35 Lactic acid 2.5 VBG without acidosis, PCO2 40 X-ray with edema versus pneumonia, given cough we will treat for pneumonia, allergy to azithromycin we will do Rocephin and Doxy MRI brain neg for acute process EKG without ischemic changes UA positive for UTI  Discussed labs and imaging with daughter in room.  Given point drop in hemoglobin we  will get a cold however she denies any melanotic or bright red blood in her stool.  Occult neg  Patient reassessed.  Given antibiotics as well as IV fluids.  Will admit for further management and work-up of altered mental status, suspect multifactorial likely due to UTI/PNA/ cirrhosis  Updated patient and family in room, agreeable with admission  CONSULT with Dr. Josph Macho- Orlin Hilding who is agreeable for admission  The patient appears reasonably stabilized for admission considering the current resources, flow, and capabilities available in the ED at this time, and I doubt any other [facility] requiring further screening and/or treatment in the ED prior to admission.                            Medical Decision Making Amount and/or Complexity of Data Reviewed Independent Historian:     Details: Family in room External Data Reviewed: labs, radiology, ECG and notes. Labs: ordered. Decision-making details documented in ED Course. Radiology: ordered and independent interpretation performed. Decision-making details documented in ED Course. ECG/medicine tests: ordered and independent interpretation performed. Decision-making details documented in ED Course.  Risk OTC drugs. Prescription drug management. Parenteral controlled substances. Decision regarding hospitalization. Diagnosis or treatment significantly limited by social determinants of health.          Final Clinical Impression(s) / ED Diagnoses Final diagnoses:  Altered mental status, unspecified altered mental status type  History of cirrhosis  Hypokalemia  Pneumonia of both lungs due to infectious organism, unspecified part of lung  Hypoalbuminemia  Anemia, unspecified type  Chronic respiratory failure with hypoxia ([place])  Acute cystitis without hematuria    Rx / DC Orders ED Discharge Orders     None         Nickolis Diel A, PA-C 04/19/22 2053    Godfrey Pick, MD 04/26/22 [phone]

## 2022-04-19 NOTE — Telephone Encounter (Signed)
Agree with advice given

## 2022-04-19 NOTE — Telephone Encounter (Signed)
noted 

## 2022-04-20 DIAGNOSIS — J45909 Unspecified asthma, uncomplicated: Secondary | ICD-10-CM | POA: Diagnosis present

## 2022-04-20 DIAGNOSIS — E876 Hypokalemia: Secondary | ICD-10-CM

## 2022-04-20 DIAGNOSIS — F32A Depression, unspecified: Secondary | ICD-10-CM

## 2022-04-20 DIAGNOSIS — L4050 Arthropathic psoriasis, unspecified: Secondary | ICD-10-CM | POA: Diagnosis present

## 2022-04-20 DIAGNOSIS — F419 Anxiety disorder, unspecified: Secondary | ICD-10-CM

## 2022-04-20 DIAGNOSIS — N3000 Acute cystitis without hematuria: Secondary | ICD-10-CM | POA: Diagnosis not present

## 2022-04-20 DIAGNOSIS — Z6841 Body Mass Index (BMI) 40.0 and over, adult: Secondary | ICD-10-CM | POA: Diagnosis not present

## 2022-04-20 DIAGNOSIS — J189 Pneumonia, unspecified organism: Secondary | ICD-10-CM | POA: Diagnosis present

## 2022-04-20 DIAGNOSIS — E871 Hypo-osmolality and hyponatremia: Secondary | ICD-10-CM | POA: Diagnosis present

## 2022-04-20 DIAGNOSIS — I5033 Acute on chronic diastolic (congestive) heart failure: Secondary | ICD-10-CM | POA: Diagnosis not present

## 2022-04-20 DIAGNOSIS — Z515 Encounter for palliative care: Secondary | ICD-10-CM | POA: Diagnosis not present

## 2022-04-20 DIAGNOSIS — M069 Rheumatoid arthritis, unspecified: Secondary | ICD-10-CM | POA: Diagnosis present

## 2022-04-20 DIAGNOSIS — E782 Mixed hyperlipidemia: Secondary | ICD-10-CM

## 2022-04-20 DIAGNOSIS — K7682 Hepatic encephalopathy: Secondary | ICD-10-CM | POA: Diagnosis present

## 2022-04-20 DIAGNOSIS — I4891 Unspecified atrial fibrillation: Secondary | ICD-10-CM | POA: Diagnosis not present

## 2022-04-20 DIAGNOSIS — G9341 Metabolic encephalopathy: Secondary | ICD-10-CM | POA: Diagnosis present

## 2022-04-20 DIAGNOSIS — D689 Coagulation defect, unspecified: Secondary | ICD-10-CM | POA: Diagnosis present

## 2022-04-20 DIAGNOSIS — K766 Portal hypertension: Secondary | ICD-10-CM | POA: Diagnosis present

## 2022-04-20 DIAGNOSIS — E0590 Thyrotoxicosis, unspecified without thyrotoxic crisis or storm: Secondary | ICD-10-CM

## 2022-04-20 DIAGNOSIS — J4520 Mild intermittent asthma, uncomplicated: Secondary | ICD-10-CM

## 2022-04-20 DIAGNOSIS — K567 Ileus, unspecified: Secondary | ICD-10-CM | POA: Diagnosis not present

## 2022-04-20 DIAGNOSIS — J9611 Chronic respiratory failure with hypoxia: Secondary | ICD-10-CM

## 2022-04-20 DIAGNOSIS — E7220 Disorder of urea cycle metabolism, unspecified: Secondary | ICD-10-CM

## 2022-04-20 DIAGNOSIS — E440 Moderate protein-calorie malnutrition: Secondary | ICD-10-CM | POA: Diagnosis not present

## 2022-04-20 DIAGNOSIS — N390 Urinary tract infection, site not specified: Secondary | ICD-10-CM | POA: Diagnosis present

## 2022-04-20 DIAGNOSIS — I480 Paroxysmal atrial fibrillation: Secondary | ICD-10-CM | POA: Diagnosis present

## 2022-04-20 DIAGNOSIS — K219 Gastro-esophageal reflux disease without esophagitis: Secondary | ICD-10-CM

## 2022-04-20 DIAGNOSIS — K7460 Unspecified cirrhosis of liver: Secondary | ICD-10-CM | POA: Diagnosis present

## 2022-04-20 DIAGNOSIS — E872 Acidosis, unspecified: Secondary | ICD-10-CM | POA: Diagnosis present

## 2022-04-20 DIAGNOSIS — I110 Hypertensive heart disease with heart failure: Secondary | ICD-10-CM | POA: Diagnosis present

## 2022-04-20 DIAGNOSIS — Z7189 Other specified counseling: Secondary | ICD-10-CM | POA: Diagnosis not present

## 2022-04-20 DIAGNOSIS — Z9189 Other specified personal risk factors, not elsewhere classified: Secondary | ICD-10-CM | POA: Diagnosis not present

## 2022-04-20 DIAGNOSIS — E873 Alkalosis: Secondary | ICD-10-CM | POA: Diagnosis not present

## 2022-04-20 DIAGNOSIS — G4733 Obstructive sleep apnea (adult) (pediatric): Secondary | ICD-10-CM | POA: Diagnosis not present

## 2022-04-20 DIAGNOSIS — R4182 Altered mental status, unspecified: Secondary | ICD-10-CM | POA: Diagnosis present

## 2022-04-20 DIAGNOSIS — E039 Hypothyroidism, unspecified: Secondary | ICD-10-CM | POA: Diagnosis present

## 2022-04-20 DIAGNOSIS — I10 Essential (primary) hypertension: Secondary | ICD-10-CM

## 2022-04-20 DIAGNOSIS — N3941 Urge incontinence: Secondary | ICD-10-CM

## 2022-04-20 DIAGNOSIS — I5031 Acute diastolic (congestive) heart failure: Secondary | ICD-10-CM | POA: Diagnosis not present

## 2022-04-20 LAB — CBC WITH DIFFERENTIAL/PLATELET
Abs Immature Granulocytes: 0.03 10*3/uL (ref 0.00–0.07)
Basophils Absolute: 0 10*3/uL (ref 0.0–0.1)
Basophils Relative: 0 %
Eosinophils Absolute: 0 10*3/uL (ref 0.0–0.5)
Eosinophils Relative: 0 %
HCT: 27 % — ABNORMAL LOW (ref 36.0–46.0)
Hemoglobin: 8.9 g/dL — ABNORMAL LOW (ref 12.0–15.0)
Immature Granulocytes: 0 %
Lymphocytes Relative: 15 %
Lymphs Abs: 1.1 10*3/uL (ref 0.7–4.0)
MCH: 31.6 pg (ref 26.0–34.0)
MCHC: 33 g/dL (ref 30.0–36.0)
MCV: 95.7 fL (ref 80.0–100.0)
Monocytes Absolute: 0.6 10*3/uL (ref 0.1–1.0)
Monocytes Relative: 9 %
Neutro Abs: 5.2 10*3/uL (ref 1.7–7.7)
Neutrophils Relative %: 76 %
Platelets: 99 10*3/uL — ABNORMAL LOW (ref 150–400)
RBC: 2.82 MIL/uL — ABNORMAL LOW (ref 3.87–5.11)
RDW: 15.6 % — ABNORMAL HIGH (ref 11.5–15.5)
WBC: 7 10*3/uL (ref 4.0–10.5)
nRBC: 0 % (ref 0.0–0.2)

## 2022-04-20 LAB — IRON AND TIBC
Iron: 58 ug/dL (ref 28–170)
Saturation Ratios: 41 % — ABNORMAL HIGH (ref 10.4–31.8)
TIBC: 142 ug/dL — ABNORMAL LOW (ref 250–450)
UIBC: 84 ug/dL

## 2022-04-20 LAB — RETICULOCYTES
Immature Retic Fract: 23.9 % — ABNORMAL HIGH (ref 2.3–15.9)
RBC.: 2.84 MIL/uL — ABNORMAL LOW (ref 3.87–5.11)
Retic Count, Absolute: 94 10*3/uL (ref 19.0–186.0)
Retic Ct Pct: 3.3 % — ABNORMAL HIGH (ref 0.4–3.1)

## 2022-04-20 LAB — TSH: TSH: 3.861 u[IU]/mL (ref 0.350–4.500)

## 2022-04-20 LAB — FOLATE: Folate: 5.4 ng/mL — ABNORMAL LOW (ref >5.9)

## 2022-04-20 LAB — FERRITIN: Ferritin: 116 ng/mL (ref 11–307)

## 2022-04-20 LAB — VITAMIN B12: Vitamin B-12: 365 pg/mL (ref 180–914)

## 2022-04-20 LAB — MAGNESIUM: Magnesium: 1.6 mg/dL — ABNORMAL LOW (ref 1.7–2.4)

## 2022-04-20 MED ORDER — LACTULOSE 10 GM/15ML PO SOLN
20.0000 g | Freq: Three times a day (TID) | ORAL | Status: DC
  Administered 2022-04-20 – 2022-04-23 (×9): 20 g via ORAL
  Filled 2022-04-20 (×9): qty 30

## 2022-04-20 MED ORDER — FUROSEMIDE 10 MG/ML IJ SOLN
40.0000 mg | Freq: Two times a day (BID) | INTRAMUSCULAR | Status: DC
  Administered 2022-04-20 – 2022-04-21 (×2): 40 mg via INTRAVENOUS
  Filled 2022-04-20 (×2): qty 4

## 2022-04-20 MED ORDER — IPRATROPIUM-ALBUTEROL 0.5-2.5 (3) MG/3ML IN SOLN
3.0000 mL | RESPIRATORY_TRACT | Status: DC | PRN
  Administered 2022-04-20 – 2022-04-23 (×3): 3 mL via RESPIRATORY_TRACT
  Filled 2022-04-20 (×3): qty 3

## 2022-04-20 MED ORDER — SODIUM CHLORIDE 0.9 % IV SOLN
1.0000 g | INTRAVENOUS | Status: DC
  Administered 2022-04-20 – 2022-04-23 (×4): 1 g via INTRAVENOUS
  Filled 2022-04-20 (×4): qty 10

## 2022-04-20 MED ORDER — MAGNESIUM SULFATE 2 GM/50ML IV SOLN
2.0000 g | Freq: Once | INTRAVENOUS | Status: AC
  Administered 2022-04-20: 2 g via INTRAVENOUS
  Filled 2022-04-20: qty 50

## 2022-04-20 MED ORDER — MECLIZINE HCL 12.5 MG PO TABS
25.0000 mg | ORAL_TABLET | Freq: Three times a day (TID) | ORAL | Status: DC | PRN
  Administered 2022-04-20 – 2022-04-21 (×2): 25 mg via ORAL
  Filled 2022-04-20 (×2): qty 2

## 2022-04-20 MED ORDER — POTASSIUM CHLORIDE CRYS ER 20 MEQ PO TBCR
40.0000 meq | EXTENDED_RELEASE_TABLET | ORAL | Status: AC
  Administered 2022-04-20 (×2): 40 meq via ORAL
  Filled 2022-04-20 (×2): qty 2

## 2022-04-20 NOTE — H&P (Signed)
History and Physical    Patient: Yolanda [name] DUK:[id number] DOB: Dec 08, 1955 DOA: 04/19/2022 DOS: the patient was seen and examined on 04/20/2022 PCP: Celene Squibb, MD  Patient coming from: Home  Chief Complaint:  Chief Complaint  Patient presents with   Altered Mental Status   HPI: Yolanda [name] is a 66 y.o. female with medical history significant of anxiety, depression, GERD, hypertension, hypothyroidism, liver cirrhosis secondary to [place], obesity, pulmonary embolism, sleep apnea, and more presents the ED with chief complaint of difficulty breathing and disoriented.  Patient reports that her daughter says her symptoms have been gradually progressing over 2 weeks.  Patient noticed an acute change this morning upon waking.  She remembers being confused.  She reports the orthopnea is normal for her.  She continues to have to prop himself up at night with otherwise having shortness of breath.  She denies any new cough or fever.  Patient reports that she has had some chest pain from right to left.  It is sharp when she is across her chest.  Today it was mostly on the right side.  It is only there for a few seconds at a time.  Patient reports that she feels dizzy.  She had some nausea but no vomiting.  She reports that she felt generally ill and washed out yesterday.  Patient reports that she supposed to take her lactulose twice a day but she has been taking his once a day.  Apparently when the daughter was here she was reporting to staff that 7 days patient is needing to take her lactulose.  Patient reports that she cannot tell us when her last bowel was because she cannot remember.  Patient does not smoke, drink, use illicit drugs.  She is vaccinated for COVID.  Patient is full code. Review of Systems: As mentioned in the history of present illness. All other systems reviewed and are negative. Past Medical History:  Diagnosis Date   Abnormal uterine bleeding    Anxiety    Arthritis     Chronic abdominal pain    Chronic pain in left foot    Depression    Elevated liver function tests 2018   Endometriosis    Fibromyalgia    GERD 12/21/2009   Qualifier: Diagnosis of  By: Craige Cotta     Hypertension    stopped meds in Aug 2015   Hypothyroidism    IBS (irritable bowel syndrome)    Internal hemorrhoids    Kidney stone    Liver cirrhosis secondary to NASH (HCC)    Obesity, morbid (HCC)    PONV (postoperative nausea and vomiting)    Psoriatic arthritis ([place])    Pulmonary embolism ([place]) 12/2005   Qualifier: Diagnosis of  By: Kellie Simmering LPN, Almyra Free     Rheumatoid arthritis [facility])    Sleep apnea    Vitamin D deficiency    Past Surgical History:  Procedure Laterality Date   ABDOMINAL SURGERY     laparoscopy   BIOPSY N/A 03/24/2014   Procedure: GASTRIC BIOPSIES;  Surgeon: Danie Binder, MD;  Location: [facility];  Service: Endoscopy;  Laterality: N/A;   BIOPSY  10/28/2019   Procedure: BIOPSY;  Surgeon: Danie Binder, MD;  Location: [facility];  Service: Endoscopy;;  gastric nodule   BREAST LUMPECTOMY Right    CESAREAN SECTION     X2   COLONOSCOPY  2006   internal hemorrhoids   COLONOSCOPY WITH PROPOFOL N/A 03/24/2014   Dr. [name]: 2  tubular adenomas removed, hemorrhoids   COLONOSCOPY WITH PROPOFOL N/A 10/28/2019   Fields: External and internal hemorrhoids, 8 polyps ranging from 2 to 5 mm in size removed from the colon.  Multiple tubular adenomas.  Next colonoscopy in 3 years.   CYSTOSCOPY W/ RETROGRADES  01/23/2012   Procedure: CYSTOSCOPY WITH RETROGRADE PYELOGRAM;  Surgeon: Marissa Nestle, MD;  Location: [facility];  Service: Urology;  Laterality: Left;   DILATATION & CURETTAGE/HYSTEROSCOPY WITH MYOSURE N/A 11/18/2014   Procedure: DILATATION & CURETTAGE/HYSTEROSCOPY WITH MYOSURE, resection of polyp;  Surgeon: Cheri Fowler, MD;  Location: [facility];  Service: Gynecology;  Laterality: N/A;   DILATION AND CURETTAGE OF UTERUS     ESOPHAGOGASTRODUODENOSCOPY   08/24/2006    Dr. Veto Kemps erythema of the antrum without erosion or ulcers/Otherwise, normal esophagus without evidence of Barrett's path with chronic gastritis   ESOPHAGOGASTRODUODENOSCOPY (EGD) WITH PROPOFOL N/A 03/24/2014   Dr. [name]: gastritis   ESOPHAGOGASTRODUODENOSCOPY (EGD) WITH PROPOFOL N/A 10/28/2019   Fields: Esophagus appeared normal, empiric dilation due to history of dysphagia.  6 mm nodule seen in the gastric cardia.  Mild portal hypertensive gastropathy.  Nodule from the stomach biopsied and showed mild chronic gastritis, no H. pylori.   POLYPECTOMY N/A 03/24/2014   Procedure: POLYPECTOMY;  Surgeon: Danie Binder, MD;  Location: [facility];  Service: Endoscopy;  Laterality: N/A;   POLYPECTOMY  10/28/2019   Procedure: POLYPECTOMY;  Surgeon: Danie Binder, MD;  [facility];  Service: Endoscopy;;  hepatic flexure, ascending colon,sigmoid colon, rectal   REMOVAL OF STONES  01/23/2012   Procedure: REMOVAL OF STONES;  Surgeon: Marissa Nestle, MD;  Location: [facility];  Service: Urology;  Laterality: N/A;   SAVORY DILATION N/A 10/28/2019   Procedure: SAVORY DILATION;  Surgeon: Danie Binder, MD;  Location[facility];  Service: Endoscopy;  Laterality: N/A;   TUBAL LIGATION     Social History:  reports that she quit smoking about 23 years ago. Her smoking use included cigarettes. She has never used smokeless tobacco. She reports that she does not currently use alcohol. She reports that she does not use drugs.  Allergies  Allergen Reactions   Azithromycin Hives   Lisinopril Cough   Morphine Hives   Sulfonamide Derivatives Other (See Comments)    Vaginal Infection     Family History  Problem Relation Age of Onset   Heart attack Mother        CABG   Stroke Mother    Hypertension Mother    Cancer Mother    Diabetes Mother    Heart attack Father        CABG   Hypertension Father    Mesothelioma Father    Heart attack Brother    Diabetes Brother    Depression Brother     Hyperlipidemia Brother    Diabetes Maternal Grandmother    Diabetes Maternal Grandfather    Cancer Paternal Grandmother    Diabetes Brother    Hyperlipidemia Brother    Wilson's disease Other        2 nieces, one died in her 19s   Colon cancer Neg Hx     Prior to Admission medications   Medication Sig Start Date End Date Taking? Authorizing Provider  albuterol (VENTOLIN HFA) 108 (90 Base) MCG/ACT inhaler Inhale 1-2 puffs into the lungs every 6 (six) hours as needed for wheezing or shortness of breath. 11/08/19  Yes Long, Wonda Olds, MD  aspirin EC 81 MG tablet Take 81  mg by mouth daily. Swallow whole.   Yes [provider]  budesonide-formoterol (SYMBICORT) 80-4.5 MCG/ACT inhaler Take 2 puffs first thing in am and then another 2 puffs about 12 hours later. 11/25/21  Yes Tanda Rockers, MD  Calcium Carb-Cholecalciferol (CALCIUM 600 + D PO) Take by mouth.   Yes [provider]  Cholecalciferol (VITAMIN D3) 2000 units TABS Take 2,000 Units by mouth daily.    Yes [provider]  clotrimazole (MYCELEX) 10 MG troche Take 10 mg by mouth 5 (five) times daily.   Yes [provider]  DULoxetine (CYMBALTA) 60 MG capsule Take 60 mg by mouth daily. 07/15/21  Yes [provider]  inFLIXimab (REMICADE IV) Inject into the vein. Q three months per patient.   Yes [provider]  lactulose, encephalopathy, (CHRONULAC) 10 GM/15ML SOLN Take 30 mLs (20 g total) by mouth daily. Ok to take additional dose if No BM in 24 hrs 03/29/22  Yes Carver, Elon Alas, DO  levothyroxine (SYNTHROID) 75 MCG tablet Take 1 tablet by mouth daily. 12/26/21  Yes [provider]  meclizine (ANTIVERT) 25 MG tablet Take 25 mg by mouth 3 (three) times daily as needed for dizziness.   Yes [provider]  Melatonin 10 MG CAPS Take 10 mg by mouth at bedtime as needed (sleep).   Yes [provider]  metFORMIN (GLUCOPHAGE-XR) 500 MG 24 hr tablet 1 po with supper x 1  week, then 1 po BID WC x 1 week, then 2 po BID WC Patient taking differently: Take 500 mg by mouth daily as needed (sugar). 09/22/21  Yes Johnson, Clanford L, MD  olmesartan-hydrochlorothiazide (BENICAR HCT) 40-12.5 MG tablet Take 1 tablet by mouth daily. 12/07/21  Yes Emokpae, Courage, MD  omeprazole (PRILOSEC) 20 MG capsule Take 1 capsule (20 mg total) by mouth 2 (two) times daily before a meal. 1 po 30 mins prior to [place]. Patient taking differently: Take 20 mg by mouth 2 (two) times daily as needed (gerd). 03/09/21 04/19/22 Yes Carver, Charles K, DO  pantoprazole (PROTONIX) 40 MG tablet Take 40 mg by mouth 2 (two) times daily.   Yes [provider]  pilocarpine (SALAGEN) 5 MG tablet Take 5 mg by mouth 2 (two) times daily.   Yes [provider]  rifaximin (XIFAXAN) 550 MG TABS tablet Take 550 mg by mouth 2 (two) times daily.   Yes [provider]  rosuvastatin (CRESTOR) 10 MG tablet Take 10 mg by mouth daily.   Yes [provider]  spironolactone (ALDACTONE) 50 MG tablet Take 1 tablet (50 mg total) by mouth daily. 03/23/22 03/23/23 Yes Carver, Elon Alas, DO  sucralfate (CARAFATE) 1 g tablet Take 1 g by mouth 4 (four) times daily. 01/25/22  Yes [provider]  blood glucose meter kit and supplies Dispense based on patient and insurance preference. Use up to four times daily as directed. (FOR ICD-10 E10.9, E11.9). 09/22/21   Johnson, Clanford L, MD  HYDROcodone-acetaminophen (NORCO/VICODIN) 5-325 MG tablet Take 1 tablet by mouth every 4 (four) hours as needed for moderate pain. Patient not taking: Reported on 03/23/2022 02/22/22 02/22/23  Sidney Ace    Physical Exam: Vitals:   04/20/22 0200 04/20/22 0230 04/20/22 0300 04/20/22 0330  BP: (!) 143/67 139/69 (!) 153/72 (!) 156/64  Pulse: (!) 105 100 (!) 105 (!) 108  Resp: (!) 24 20 (!) 28 19  Temp:   98.7 F (37.1 C)   TempSrc:  SpO2: 93% 97% 97% 93%  Weight:      Height:       1.   General: Patient lying supine in bed,  no acute distress   2. Psychiatric: Alert and oriented x 3, mood and behavior normal for situation, pleasant and cooperative with exam   3. Neurologic: Speech and language are normal, face is symmetric, moves all 4 extremities voluntarily, at baseline without acute deficits on limited exam   4. HEENMT:  Head is atraumatic, normocephalic, pupils reactive to light, neck is supple, trachea is midline, mucous membranes are moist   5. Respiratory : Mild wheezing on exam without rhonchi, rales, no cyanosis, no increase in work of breathing or accessory muscle use   6. Cardiovascular : Heart rate normal, rhythm is regular, no murmurs, rubs or gallops, no peripheral edema, peripheral pulses palpated   7. Gastrointestinal:  Abdomen is soft, nondistended, nontender to palpation bowel sounds active, no masses or organomegaly palpated   8. Skin:  Chronic (per patient) erythema on anterior aspect of the bilateral lower extremities from ankle to knee   9.Musculoskeletal:  No acute deformities or trauma, no asymmetry in tone, no peripheral edema, peripheral pulses palpated, tenderness to palpation of the anterior aspect of the bilateral lower part of the lower extremities   Data Reviewed: In the ED Temp 98, heart rate 94-105, respiratory rate 17-25, blood pressure 125/56-146/84, satting 98% on 3 L nasal cannula VBG shows a pH of 7.5, PCO2 of 40 Slight leukocytosis at 12.1-likely related to UTI 13>> 9.3 hemoglobin drop in several months FOBT negative No active signs of bleeding Chemistry shows a potassium of 2.9 and an albumin of less than 1.5 Lactic acid initially 2.5, repeat one-point 5:03 100 mL bolus UA is indicative of UTI FOBT is negative, UDS negative Blood culture pending Patient started on Rocephin and Doxy  Assessment and Plan: * Acute metabolic encephalopathy - Likely multifactorial with infection, electrolyte abnormalities, hepatic  encephalopathy all contributing -Start lactulose - Ammonia level is 58 - MRI brain without contrast shows no acute intracranial abnormality.  Mild chronic vessel ischemic disease. - Patient's UA is indicative of UTI - Last urine culture grew Klebsiella pneumonia susceptible to Rocephin - Continue Rocephin - Check TSH -Replace potassium - Continue to monitor   UTI (urinary tract infection) - Continue Rocephin - Last urine culture grew Klebsiella pneumonia and susceptible to Rocephin - Urine culture pending - Continue to monitor  Hypokalemia - Potassium 2.9 in admission - Patient was given 40 mEq potassium in the ER - Trend in the a.m. - Continue to monitor  Asthma, chronic - Chronic respiratory failure with hypoxia on 3 L nasal cannula - Currently requiring 3 L nasal cannula - Chest x-ray shows edema versus pneumonia - Patient denies cough or fever - Rocephin and Doxy started in the ED - Add DuoNebs as needed - Continue Symbicort - Patient is wheezing on exam, but reports her breathing is almost at her baseline from a comfort standpoint- continue to monitor  Chronic respiratory failure with hypoxia (HCC) - Currently on baseline 3 L nasal cannula - Continue DuoNeb - pH 7.5, PCO2 40  On venous blood gas, slightly alkalotic with a respiratory alkalosis  Hypothyroidism - Continue Synthroid - Check TSH  GERD - Continue PPI  Essential hypertension - Continue hydrochlorothiazide and olmesartan - Continue spironolactone  Mixed hyperlipidemia - Continue Crestor      Advance Care Planning:   Code Status: Full Code   Consults: None  Family Communication: None at bedside  Severity of Illness: The appropriate patient status for this patient is OBSERVATION. Observation status is judged to be reasonable and necessary in order to provide the required intensity of service to ensure the patient's safety. The patient's presenting symptoms, physical exam findings, and  initial radiographic and laboratory data in the context of their medical condition is felt to place them at decreased risk for further clinical deterioration. Furthermore, it is anticipated that the patient will be medically stable for discharge from the hospital within 2 midnights of admission.   Author: Rolla Plate, DO 04/20/2022 3:58 AM  For on call review [email or web address].

## 2022-04-20 NOTE — ED Notes (Signed)
Pt given sandwich meal.

## 2022-04-20 NOTE — Care Management Obs Status (Signed)
[facility] NOTIFICATION   Patient Details  Name: Yolanda [name] MRN: [id number] Date of Birth: 1955-10-06   Medicare Observation Status Notification Given:  Yes    Tommy Medal 04/20/2022, 2:39 PM

## 2022-04-20 NOTE — Assessment & Plan Note (Signed)
Continue Crestor 

## 2022-04-20 NOTE — Assessment & Plan Note (Addendum)
-   Chronic respiratory failure with hypoxia on 3 L nasal cannula - Currently requiring 3 L nasal cannula - Chest x-ray shows edema versus pneumonia - Patient denies cough or fever - Rocephin and Doxy started in the ED - Add DuoNebs as needed - Continue Symbicort - Patient is wheezing on exam, but reports her breathing is almost at her baseline from a comfort standpoint- continue to monitor

## 2022-04-20 NOTE — Progress Notes (Signed)
PROGRESS NOTE  Yolanda [name]:[id number] DOB: 03/09/1956   PCP: Celene Squibb, MD  Patient is from: Home.  Lives with daughter.  Uses rollator at baseline.  DOA: 04/19/2022 LOS: 0  Chief complaints Chief Complaint  Patient presents with   Altered Mental Status     Brief Narrative / Interim history: 66 year old F with PMH of NASH liver cirrhosis, morbid obesity, PE not on AC, OSA, HTN, anxiety, depression, GERD, hypothyroidism and psoriatic arthritis presenting with progressive shortness of breath, disorientation, fatigue and nausea, and admitted for acute metabolic encephalopathy in the setting of hepatic encephalopathy and possible UTI.   In ED, stable vitals.  Na 133.  K2.9.  AST 60.  Total bili 1.8.  WBC 12.1.  Hgb 9.3.  Platelet 116.  Ammonia 58.  Lactic acid 2.5.  BNP 81.  UA concerning for UTI.  UDS negative.  CXR showed cardiomegaly with small pleural effusions and mixed interstitial and air space opacities which may reflect edema versus pneumonia.  MRI brain without acute finding.  Cultures obtained.  Patient was started on IV ceftriaxone for possible UTI and lactulose for hepatic encephalopathy, and admitted.   Subjective: Seen and examined earlier this morning.  No major events overnight or this morning.  Feels better today but continues to endorse shortness of breath and dry cough on and off at rest.  She reports some chest pain/pressure for weeks without acute change.  She also reports nausea but no emesis.  She has had worsening urge incontinence over the last 1 month but denies acute change.  Denies melena or hematochezia.  Objective: Vitals:   04/20/22 0535 04/20/22 0800 04/20/22 0926 04/20/22 1312  BP:   (!) 144/61 (!) 117/58  Pulse:   98 94  Resp: (!) 22  20   Temp:    98.3 F (36.8 C)  TempSrc:    Oral  SpO2: 98% 97% 96% 96%  Weight:      Height:        Examination:  GENERAL: No apparent distress.  Nontoxic. HEENT: MMM.  Vision and hearing grossly  intact.  NECK: Supple.  No apparent JVD.  RESP:  No IWOB.  Fair aeration bilaterally. CVS:  RRR. Heart sounds normal.  ABD/GI/GU: BS+. Abd soft, NTND.  MSK/EXT:  Moves extremities. No apparent deformity. No edema.  SKIN: no apparent skin lesion or wound NEURO: Awake, alert and oriented appropriately.  No apparent focal neuro deficit. PSYCH: Calm. Normal affect.     Procedures:  None  Microbiology summarized: Blood cultures NGTD Urine culture pending  Assessment and plan: Principal Problem:   Acute metabolic encephalopathy Active Problems:   Hyperammonemia /Hepatic encephalopathy   UTI (urinary tract infection)   Mixed hyperlipidemia   Anxiety and depression   Essential hypertension   GERD (gastroesophageal reflux disease)   Obesity, Class III, BMI 40-49.9 (morbid obesity) (HCC)   Hypothyroidism   Chronic respiratory failure with hypoxia (HCC)   Asthma, chronic  Acute metabolic encephalopathy: Multifactorial including hepatic encephalopathy and possible UTI.  She reports inconsistence use of lactulose due to encephalopathy.  Ammonia elevated to 58.  TSH and B12 within normal.  Urinalysis consistent with UTI but no acute urinary habit change other than worsening urge incontinence over the course of last month.  Encephalopathy seems to have improved.  She is awake, alert and oriented x4 except time. -Continue lactulose for hepatic encephalopathy -Continue ceftriaxone for possible UTI -Follow urine culture -Reorientation and delirium precautions. -Avoid or minimize sedating medications  Acute diastolic CHF: Daughter reports progressive PND and orthopnea over the last few months.  No formal diagnosis of CHF.  Last TTE in 11/2021 with LVEF of 65 to 70% and indeterminate DD.  CXR concerning for pulmonary vascular congestion.  BNP was normal but could be falsely low given body habitus/obesity. -Start IV Lasix 40 mg every 12 hours. -Strict intake and output, renal functions and  electrolytes -Hold home HCTZ and losartan while on IV diuretics -Repeat CXR in 1 to 2 days to see improvement   Possible UTI/urge incontinence: Patient denies acute urinary habit change although she reports progressive urge incontinence over the last 12 months.  UA concerning. -Continue ceftriaxone pending urine culture -Recommend outpatient follow-up with urology   Hypokalemia/hypomagnesemia: K2.8.  Mg 1.6. -P.o. KCl 40x3 -IV magnesium sulfate 2 g x 1 -Recheck in the morning  NASH cirrhosis/elevated AST/hyperbilirubinemia: Mild AST and bili elevation -Continue home diuretics, lactulose and rifaximin   Asthma, chronic/chronic hypoxic RF on 3 L: Reports intermittent shortness of breath and cough.  ABG with respiratory alkalosis. -Continue inhalers -Doubt need for steroid.   Hypothyroidism: TSH within normal. -Continue home Synthroid   GERD -Continue PPI   Essential hypertension: BP within acceptable range. -Continue home meds and diuretics   Mixed hyperlipidemia -Continue home Crestor  Generalized weakness/physical deconditioning -PT/OT eval   Morbid obesity Body mass index is 47.2 kg/m.          DVT prophylaxis:  heparin injection 5,000 Units Start: 04/19/22 2200 SCDs Start: 04/19/22 2058  Code Status: Full code Family Communication: Updated patient's daughter over the phone. Level of care: Telemetry Status is: Observation The patient will require care spanning > 2 midnights and should be moved to inpatient because: Acute encephalopathy, acute diastolic CHF, electrolyte derangements and possible UTI   Final disposition: TBD Consultants:  None  Sch Meds:  Scheduled Meds:  aspirin EC  81 mg Oral Daily   DULoxetine  60 mg Oral Daily   heparin  5,000 Units Subcutaneous Q8H   hydrochlorothiazide  12.5 mg Oral Daily   irbesartan  300 mg Oral Daily   lactulose  20 g Oral Daily   levothyroxine  75 mcg Oral Q0600   mometasone-formoterol  2 puff Inhalation  BID   pantoprazole  40 mg Oral Daily   rifaximin  550 mg Oral BID   rosuvastatin  10 mg Oral Daily   spironolactone  50 mg Oral Daily   Continuous Infusions:  cefTRIAXone (ROCEPHIN)  IV     PRN Meds:.acetaminophen **OR** acetaminophen, ipratropium-albuterol, ondansetron **OR** ondansetron (ZOFRAN) IV, oxyCODONE  Antimicrobials: Anti-infectives (From admission, onward)    Start     Dose/Rate Route Frequency Ordered Stop   04/20/22 1800  cefTRIAXone (ROCEPHIN) 1 g in sodium chloride 0.9 % 100 mL IVPB        1 g 200 mL/hr over 30 Minutes Intravenous Every 24 hours 04/20/22 0134     04/19/22 2200  rifaximin (XIFAXAN) tablet 550 mg        550 mg Oral 2 times daily 04/19/22 2057     04/19/22 1845  cefTRIAXone (ROCEPHIN) 1 g in sodium chloride 0.9 % 100 mL IVPB        1 g 200 mL/hr over 30 Minutes Intravenous  Once 04/19/22 1838 04/19/22 1956   04/19/22 1845  doxycycline (VIBRA-TABS) tablet 100 mg        100 mg Oral  Once 04/19/22 1838 04/19/22 1912        I have personally  reviewed the following labs and images: CBC: Recent Labs  Lab 04/19/22 1709 04/19/22 1718 04/20/22 0307  WBC 12.1*  --  7.0  NEUTROABS  --   --  5.2  HGB 9.3* 9.9* 8.9*  HCT 28.7* 29.0* 27.0*  MCV 96.6  --  95.7  PLT 116*  --  99*   BMP &GFR Recent Labs  Lab 04/19/22 1709 04/19/22 1718 04/20/22 0307  NA 133* 140 133*  K 2.9* 3.1* 2.8*  CL 102 97* 102  CO2 28  --  29  GLUCOSE 93 92 143*  BUN 9 7* 9  CREATININE 0.65 0.50 0.61  CALCIUM 7.5*  --  7.8*  MG  --   --  1.6*   Estimated Creatinine Clearance: 89.1 mL/min (by C-G formula based on SCr of 0.61 mg/dL). Liver & Pancreas: Recent Labs  Lab 04/19/22 1709 04/20/22 0307  AST 60* 56*  ALT 25 23  ALKPHOS 88 79  BILITOT 1.8* 1.6*  PROT 9.2* 8.5*  ALBUMIN <1.5* <1.5*   Recent Labs  Lab 04/19/22 1709  LIPASE 35   Recent Labs  Lab 04/19/22 1709  AMMONIA 58*   Diabetic: No results for input(s): "HGBA1C" in the last 72  hours. Recent Labs  Lab 04/19/22 1645  GLUCAP 94   Cardiac Enzymes: No results for input(s): "CKTOTAL", "CKMB", "CKMBINDEX", "TROPONINI" in the last 168 hours. No results for input(s): "PROBNP" in the last 8760 hours. Coagulation Profile: No results for input(s): "INR", "PROTIME" in the last 168 hours. Thyroid Function Tests: Recent Labs    04/20/22 0307  TSH 3.861   Lipid Profile: No results for input(s): "CHOL", "HDL", "LDLCALC", "TRIG", "CHOLHDL", "LDLDIRECT" in the last 72 hours. Anemia Panel: Recent Labs    04/20/22 0307  VITAMINB12 365  FOLATE 5.4*  FERRITIN 116  TIBC 142*  IRON 58  RETICCTPCT 3.3*   Urine analysis:    Component Value Date/Time   COLORURINE AMBER (A) 04/19/2022 1927   APPEARANCEUR HAZY (A) 04/19/2022 1927   APPEARANCEUR Clear 03/08/2022 1549   LABSPEC 1.012 04/19/2022 1927   PHURINE 6.0 04/19/2022 1927   GLUCOSEU NEGATIVE 04/19/2022 1927   HGBUR MODERATE (A) 04/19/2022 1927   BILIRUBINUR NEGATIVE 04/19/2022 1927   BILIRUBINUR Negative 03/08/2022 [place] 04/19/2022 1927   PROTEINUR 30 (A) 04/19/2022 1927   UROBILINOGEN negative (A) 07/02/2019 0932   UROBILINOGEN 0.2 04/15/2015 0028   NITRITE POSITIVE (A) 04/19/2022 1927   LEUKOCYTESUR MODERATE (A) 04/19/2022 1927   Sepsis Labs: Invalid input(s): "PROCALCITONIN", "LACTICIDVEN"  Microbiology: Recent Results (from the past 240 hour(s))  Blood culture (routine x 2)     Status: None (Preliminary result)   Collection Time: 04/19/22  5:09 PM   Specimen: Left Antecubital; Blood  Result Value Ref Range Status   Specimen Description LEFT ANTECUBITAL  Final   Special Requests   Final    BOTTLES DRAWN AEROBIC AND ANAEROBIC Blood Culture adequate volume   Culture   Final    NO GROWTH < 24 HOURS Performed at [facility], [address], [place], [place] [address]    Report Status PENDING  Incomplete  Blood culture (routine x 2)     Status: None (Preliminary result)    Collection Time: 04/19/22  5:20 PM   Specimen: BLOOD RIGHT ARM  Result Value Ref Range Status   Specimen Description BLOOD RIGHT ARM  Final   Special Requests   Final    BOTTLES DRAWN AEROBIC AND ANAEROBIC Blood Culture adequate volume  Culture   Final    NO GROWTH < 24 HOURS Performed at [facility], [address], [place], [place] [address]    Report Status PENDING  Incomplete    Radiology Studies: MR BRAIN WO CONTRAST  Result Date: 04/19/2022 CLINICAL DATA:  Delirium. EXAM: MRI HEAD WITHOUT CONTRAST TECHNIQUE: Multiplanar, multiecho pulse sequences of the brain and surrounding structures were obtained without intravenous contrast. COMPARISON:  Head MRI 12/07/2021 FINDINGS: Brain: There is no evidence of an acute infarct, intracranial hemorrhage, mass, midline shift, or extra-axial fluid collection. The ventricles and sulci are within normal limits for age. T2 hyperintensities in the cerebral white matter bilaterally are unchanged and nonspecific but compatible with mild chronic small vessel ischemic disease. Vascular: Major intracranial vascular flow voids are preserved. Skull and upper cervical spine: Unremarkable bone marrow signal para Sinuses/Orbits: Unremarkable orbits. Paranasal sinuses and mastoid air cells are clear. Other: None. IMPRESSION: 1. No acute intracranial abnormality. 2. Mild chronic small vessel ischemic disease. Electronically Signed   By: Logan Bores M.D.   On: 04/19/2022 19:07   DG Chest Portable 1 View  Result Date: 04/19/2022 CLINICAL DATA:  Altered mental status. EXAM: PORTABLE CHEST 1 VIEW COMPARISON:  Chest radiographs 01/02/2022 FINDINGS: The cardiac silhouette remains mildly enlarged. There is persistent pulmonary vascular congestion with increased interstitial densities bilaterally as well as new patchy airspace opacities in the right greater than left lower lungs. There are persistent small left and new small right pleural effusions. No pneumothorax is  identified. No acute osseous abnormality is seen. IMPRESSION: Cardiomegaly with small pleural effusions and mixed interstitial and air space opacities which may reflect edema versus pneumonia. Electronically Signed   By: Logan Bores M.D.   On: 04/19/2022 17:30      Yolanda [name] T. [place]  If 7PM-7AM, please contact night-coverage [email or web address] 04/20/2022, 1:24 PM

## 2022-04-20 NOTE — Evaluation (Signed)
Physical Therapy Evaluation Patient Details Name: Yolanda [name] MRN: [id number] DOB: 20-Dec-1955 Today's Date: 04/20/2022  History of Present Illness  Yolanda [name] is a 66 y.o. female with medical history significant of anxiety, depression, GERD, hypertension, hypothyroidism, liver cirrhosis secondary to [place], obesity, pulmonary embolism, sleep apnea, and more presents the ED with chief complaint of difficulty breathing and disoriented.  Patient reports that her daughter says her symptoms have been gradually progressing over 2 weeks.  Patient noticed an acute change this morning upon waking.  She remembers being confused.  She reports the orthopnea is normal for her.  She continues to have to prop himself up at night with otherwise having shortness of breath.  She denies any new cough or fever.  Patient reports that she has had some chest pain from right to left.  It is sharp when she is across her chest.  Today it was mostly on the right side.  It is only there for a few seconds at a time.  Patient reports that she feels dizzy.  She had some nausea but no vomiting.  She reports that she felt generally ill and washed out yesterday.     Patient reports that she supposed to take her lactulose twice a day but she has been taking his once a day.  Apparently when the daughter was here she was reporting to staff that 7 days patient is needing to take her lactulose.  Patient reports that she cannot tell us when her last bowel was because she cannot remember.   Clinical Impression  Patient functioning near baseline for functional mobility and gait demonstrating good return for transferring to/from [facility] and chair, ambulated in room/hallway with slightly labored cadence without loss of balance while on 2 LPM O2 with SpO2 dropping from 93% to 88% and left on 2.5 LPM after therapy with SpO2 at 92%.  Patient tolerated sitting up in chair after therapy - nurse notified.  Patient will benefit from continued  skilled physical therapy in hospital and recommended venue below to increase strength, balance, endurance for safe ADLs and gait.          Recommendations for follow up therapy are one component of a multi-disciplinary discharge planning process, led by the attending physician.  Recommendations may be updated based on patient status, additional functional criteria and insurance authorization.  Follow Up Recommendations No PT follow up      Assistance Recommended at Discharge Set up Supervision/Assistance  Patient can return home with the following  A little help with walking and/or transfers;A little help with bathing/dressing/bathroom;Help with stairs or ramp for entrance;Assistance with cooking/housework    Equipment Recommendations None recommended by PT  Recommendations for Other Services       Functional Status Assessment Patient has had a recent decline in their functional status and demonstrates the ability to make significant improvements in function in a reasonable and predictable amount of time.     Precautions / Restrictions Precautions Precautions: Fall Restrictions Weight Bearing Restrictions: No      Mobility  Bed Mobility Overal bed mobility: Modified Independent                  Transfers Overall transfer level: Needs assistance Equipment used: Rolling walker (2 wheels) Transfers: Sit to/from Stand, Bed to chair/wheelchair/BSC Sit to Stand: Supervision   Step pivot transfers: Supervision       General transfer comment: good return for transferring to/from [facility] and chair    Ambulation/Gait Ambulation/Gait assistance: Supervision,  Min guard Gait Distance (Feet): 65 Feet Assistive device: Rolling walker (2 wheels) Gait Pattern/deviations: Decreased step length - right, Decreased step length - left, Decreased stride length Gait velocity: decreased     General Gait Details: slightly labored cadence without loss of balance, limited mostly due to  fatigue  Stairs            Wheelchair Mobility    Modified Rankin (Stroke Patients Only)       Balance Overall balance assessment: Needs assistance Sitting-balance support: Feet supported, No upper extremity supported Sitting balance-Leahy Scale: Good Sitting balance - Comments: seated at EOB   Standing balance support: During functional activity, No upper extremity supported Standing balance-Leahy Scale: Fair Standing balance comment: fair/good using RW                             Pertinent Vitals/Pain Pain Assessment Pain Assessment: No/denies pain    Home Living Family/patient expects to be discharged to:: Private residence Living Arrangements: Children;Other relatives Available Help at Discharge: Family;Available 24 hours/day Type of Home: House Home Access: Stairs to enter Entrance Stairs-Rails: None Entrance Stairs-Number of Steps: 2 Alternate Level Stairs-Number of Steps: 12 to 15 Home Layout: Two level;Laundry or work area in basement;Able to live on main level with bedroom/bathroom Home Equipment: [place] (4 wheels);Cane - single point;Shower seat;Grab bars - tub/shower Additional Comments: Pt reported no interest in having a BSC at home.    Prior Function Prior Level of Function : Needs assist       Physical Assist : Mobility (physical);ADLs (physical) Mobility (physical): Gait;Transfers;Stairs   Mobility Comments: household Geologist, engineering ADLs Comments: assisted by family     Hand Dominance   Dominant Hand: Right    Extremity/Trunk Assessment   Upper Extremity Assessment Upper Extremity Assessment: Defer to OT evaluation    Lower Extremity Assessment Lower Extremity Assessment: Generalized weakness    Cervical / Trunk Assessment Cervical / Trunk Assessment: Normal  Communication   Communication: No difficulties  Cognition Arousal/Alertness: Awake/alert Behavior During Therapy: WFL for tasks  assessed/performed Overall Cognitive Status: Within Functional Limits for tasks assessed                                          General Comments      Exercises     Assessment/Plan    PT Assessment Patient needs continued PT services  PT Problem List Decreased strength;Decreased activity tolerance;Decreased balance;Decreased mobility       PT Treatment Interventions DME instruction;Gait training;Stair training;Functional mobility training;Therapeutic activities;Therapeutic exercise;Patient/family education;Balance training    PT Goals (Current goals can be found in the Care Plan section)  Acute Rehab PT Goals Patient Stated Goal: return home with family to assist PT Goal Formulation: With patient Time For Goal Achievement: 04/24/22 Potential to Achieve Goals: Good    Frequency Min 3X/week     Co-evaluation               AM-PAC PT "6 Clicks" Mobility  Outcome Measure Help needed turning from your back to your side while in a flat bed without using bedrails?: None Help needed moving from lying on your back to sitting on the side of a flat bed without using bedrails?: None Help needed moving to and from a bed to a chair (including a wheelchair)?: A Little Help needed standing up  from a chair using your arms (e.g., wheelchair or bedside chair)?: None Help needed to walk in hospital room?: A Little Help needed climbing 3-5 steps with a railing? : A Little 6 Click Score: 21    End of Session Equipment Utilized During Treatment: Oxygen Activity Tolerance: Patient tolerated treatment well;Patient limited by fatigue Patient left: in chair;with call bell/phone within reach Nurse Communication: Mobility status PT Visit Diagnosis: Unsteadiness on feet (R26.81);Muscle weakness (generalized) (M62.81);Other abnormalities of gait and mobility (R26.89)    Time: 1610-9604 PT Time Calculation (min) (ACUTE ONLY): 31 min   Charges:   PT Evaluation $PT Eval  Moderate Complexity: 1 Mod PT Treatments $Therapeutic Activity: 23-37 mins        1:49 PM, 04/20/22 Lonell Grandchild, MPT Physical Therapist with [facility] [phone] office [phone] mobile phone

## 2022-04-20 NOTE — TOC Progression Note (Signed)
  Transition of Care [facility]) Screening Note   Patient Details  Name: Yolanda [name] Date of Birth: 01/14/1956   [facility]) CM/SW Contact:    Shade Flood, LCSW Phone Number: 04/20/2022, 9:10 AM    Transition of Care Department [facility]) has reviewed patient and no TOC needs have been identified at this time. We will continue to monitor patient advancement through interdisciplinary progression rounds. If new patient transition needs arise, please place a TOC consult.

## 2022-04-20 NOTE — Assessment & Plan Note (Signed)
-   Currently on baseline 3 L nasal cannula - Continue DuoNeb - pH 7.5, PCO2 40  On venous blood gas, slightly alkalotic with a respiratory alkalosis

## 2022-04-20 NOTE — Assessment & Plan Note (Signed)
-   Potassium 2.9 in admission - Patient was given 40 mEq potassium in the ER - Trend in the a.m. - Continue to monitor

## 2022-04-20 NOTE — Assessment & Plan Note (Signed)
-   Continue hydrochlorothiazide and olmesartan - Continue spironolactone

## 2022-04-20 NOTE — Assessment & Plan Note (Signed)
Continue PPI ?

## 2022-04-20 NOTE — Progress Notes (Signed)
Patient stated that she has not been using a CPAP for several months.  Explained that construction is being done on her house and she had trouble getting to all the parts.  She did state that she is having a sleep study done in a couple of weeks to get fitted for a new machine, but has been using her O2.

## 2022-04-20 NOTE — Assessment & Plan Note (Signed)
-   Likely multifactorial with infection, electrolyte abnormalities, hepatic encephalopathy all contributing -Start lactulose - Ammonia level is 58 - MRI brain without contrast shows no acute intracranial abnormality.  Mild chronic vessel ischemic disease. - Patient's UA is indicative of UTI - Last urine culture grew Klebsiella pneumonia susceptible to Rocephin - Continue Rocephin - Check TSH -Replace potassium - Continue to monitor

## 2022-04-20 NOTE — ED Notes (Signed)
Pt placed in a hospital bed for comfort

## 2022-04-20 NOTE — Progress Notes (Signed)
Ruthe Mannan was dispensed to patient tonight. Patient family took inhaler home. Asked patient to ask family to bring inhaler back to hospital because it was for her to have while in the hospital and don't want her to be charged for another inhaler since it's ordered BID

## 2022-04-20 NOTE — Assessment & Plan Note (Signed)
-   Continue Rocephin - Last urine culture grew Klebsiella pneumonia and susceptible to Rocephin - Urine culture pending - Continue to monitor

## 2022-04-20 NOTE — Plan of Care (Signed)
  Problem: Acute Rehab PT Goals(only PT should resolve) Goal: Pt Will Go Supine/Side To Sit 04/20/2022 1351 by Lonell Grandchild, PT Outcome: Progressing 04/20/2022 1350 by Lonell Grandchild, PT Outcome: Progressing Flowsheets (Taken 04/20/2022 1350) Pt will go Supine/Side to Sit:  Independently  with modified independence Goal: Patient Will Perform Sitting Balance 04/20/2022 1351 by Lonell Grandchild, PT Outcome: Progressing 04/20/2022 1350 by Lonell Grandchild, PT Outcome: Progressing Flowsheets (Taken 04/20/2022 1350) Patient will perform sitting balance:  with modified independence  with supervision Goal: Patient Will Transfer Sit To/From Stand 04/20/2022 1351 by Lonell Grandchild, PT Outcome: Progressing 04/20/2022 1350 by Lonell Grandchild, PT Outcome: Progressing Flowsheets (Taken 04/20/2022 1350) Patient will transfer sit to/from stand:  with modified independence  with supervision Goal: Pt Will Perform Standing Balance Or Pre-Gait 04/20/2022 1351 by Lonell Grandchild, PT Outcome: Progressing Flowsheets (Taken 04/20/2022 1351) Pt will perform standing balance or pre-gait: with Modified Independent 04/20/2022 1350 by Lonell Grandchild, PT Outcome: Progressing   Problem: Acute Rehab PT Goals(only PT should resolve) Goal: Pt Will Ambulate Outcome: Progressing Flowsheets (Taken 04/20/2022 1351) Pt will Ambulate:  75 feet  with supervision  with rolling walker   1:51 PM, 04/20/22 Lonell Grandchild, MPT Physical Therapist with [facility] [phone] office [phone] mobile phone

## 2022-04-20 NOTE — Assessment & Plan Note (Signed)
-   Continue Synthroid - Check TSH

## 2022-04-21 ENCOUNTER — Inpatient Hospital Stay (HOSPITAL_COMMUNITY): Payer: Medicare Other

## 2022-04-21 DIAGNOSIS — I5033 Acute on chronic diastolic (congestive) heart failure: Secondary | ICD-10-CM | POA: Diagnosis not present

## 2022-04-21 DIAGNOSIS — E7220 Disorder of urea cycle metabolism, unspecified: Secondary | ICD-10-CM | POA: Diagnosis not present

## 2022-04-21 DIAGNOSIS — D649 Anemia, unspecified: Secondary | ICD-10-CM

## 2022-04-21 DIAGNOSIS — D696 Thrombocytopenia, unspecified: Secondary | ICD-10-CM

## 2022-04-21 DIAGNOSIS — F419 Anxiety disorder, unspecified: Secondary | ICD-10-CM | POA: Diagnosis not present

## 2022-04-21 DIAGNOSIS — G9341 Metabolic encephalopathy: Secondary | ICD-10-CM | POA: Diagnosis not present

## 2022-04-21 LAB — RENAL FUNCTION PANEL
Albumin: <1.5 g/dL — ABNORMAL LOW (ref 3.5–5.0)
Anion gap: 2 — ABNORMAL LOW (ref 5–15)
BUN: 8 mg/dL (ref 8–23)
CO2: 29 mmol/L (ref 22–32)
Calcium: 7.9 mg/dL — ABNORMAL LOW (ref 8.9–10.3)
Chloride: 105 mmol/L (ref 98–111)
Creatinine, Ser: 0.58 mg/dL (ref 0.44–1.00)
GFR, Estimated: >60 mL/min (ref >60)
Glucose, Bld: 115 mg/dL — ABNORMAL HIGH (ref 70–99)
Phosphorus: 3 mg/dL (ref 2.5–4.6)
Potassium: 3.1 mmol/L — ABNORMAL LOW (ref 3.5–5.1)
Sodium: 136 mmol/L (ref 135–145)

## 2022-04-21 LAB — CBC
HCT: 28 % — ABNORMAL LOW (ref 36.0–46.0)
Hemoglobin: 9.2 g/dL — ABNORMAL LOW (ref 12.0–15.0)
MCH: 31.9 pg (ref 26.0–34.0)
MCHC: 32.9 g/dL (ref 30.0–36.0)
MCV: 97.2 fL (ref 80.0–100.0)
Platelets: 100 10*3/uL — ABNORMAL LOW (ref 150–400)
RBC: 2.88 MIL/uL — ABNORMAL LOW (ref 3.87–5.11)
RDW: 15.7 % — ABNORMAL HIGH (ref 11.5–15.5)
WBC: 5.4 10*3/uL (ref 4.0–10.5)
nRBC: 0 % (ref 0.0–0.2)

## 2022-04-21 LAB — MAGNESIUM: Magnesium: 1.8 mg/dL (ref 1.7–2.4)

## 2022-04-21 MED ORDER — SPIRONOLACTONE 100 MG PO TABS
100.0000 mg | ORAL_TABLET | Freq: Every day | ORAL | Status: DC
  Administered 2022-04-21 – 2022-04-25 (×5): 100 mg via ORAL
  Filled 2022-04-21: qty 4
  Filled 2022-04-21 (×2): qty 1
  Filled 2022-04-21: qty 4

## 2022-04-21 MED ORDER — FUROSEMIDE 10 MG/ML IJ SOLN
40.0000 mg | Freq: Two times a day (BID) | INTRAMUSCULAR | Status: DC
  Administered 2022-04-21 – 2022-04-23 (×5): 40 mg via INTRAVENOUS
  Filled 2022-04-21 (×5): qty 4

## 2022-04-21 MED ORDER — POTASSIUM CHLORIDE CRYS ER 20 MEQ PO TBCR
40.0000 meq | EXTENDED_RELEASE_TABLET | Freq: Once | ORAL | Status: AC
  Administered 2022-04-21: 40 meq via ORAL
  Filled 2022-04-21: qty 2

## 2022-04-21 MED ORDER — ALBUMIN HUMAN 25 % IV SOLN
25.0000 g | Freq: Two times a day (BID) | INTRAVENOUS | Status: DC
  Administered 2022-04-21 – 2022-04-23 (×5): 25 g via INTRAVENOUS
  Filled 2022-04-21 (×6): qty 100

## 2022-04-21 NOTE — Progress Notes (Signed)
PROGRESS NOTE  Yolanda [name] GEX:[id number] DOB: 08-Mar-1956   PCP: Celene Squibb, MD  Patient is from: Home.  Lives with daughter.  Uses rollator at baseline.  DOA: 04/19/2022 LOS: 1  Chief complaints Chief Complaint  Patient presents with   Altered Mental Status     Brief Narrative / Interim history: 66 year old F with PMH of NASH liver cirrhosis, morbid obesity, PE not on AC, OSA, HTN, anxiety, depression, GERD, hypothyroidism and psoriatic arthritis presenting with progressive shortness of breath, disorientation, fatigue and nausea, and admitted for acute metabolic encephalopathy in the setting of hepatic encephalopathy and possible UTI.   In ED, stable vitals.  Na 133.  K2.9.  AST 60.  Total bili 1.8.  WBC 12.1.  Hgb 9.3.  Platelet 116.  Ammonia 58.  Lactic acid 2.5.  BNP 81.  UA concerning for UTI.  UDS negative.  CXR showed cardiomegaly with small pleural effusions and mixed interstitial and air space opacities which may reflect edema versus pneumonia.  MRI brain without acute finding.  Cultures obtained.  Patient was started on IV ceftriaxone for possible UTI and lactulose for hepatic encephalopathy, and admitted.   Subjective: Seen and examined earlier this morning.  No major events overnight of this morning.  Continues to endorse intermittent shortness of breath and dry cough.  She had frequent small-volume urine output after starting IV Lasix.  No nausea, vomiting or abdominal pain.  She felt right flank pain last night that has resolved.  She is oriented x4 today.  Objective: Vitals:   04/21/22 0500 04/21/22 0847 04/21/22 1012 04/21/22 1218  BP:    (!) 136/53  Pulse:    93  Resp:    18  Temp:    97.6 F (36.4 C)  TempSrc:    Oral  SpO2:  98% 99% 99%  Weight: 121.1 kg     Height:        Examination:  GENERAL: No apparent distress.  Nontoxic. HEENT: MMM.  Vision and hearing grossly intact.  NECK: Supple.  No apparent JVD.  RESP:  No IWOB.  Bibasilar crackles.   Fair aeration bilaterally but limited exam. CVS:  RRR. Heart sounds normal.  ABD/GI/GU: BS+. Abd soft, NTND.  MSK/EXT:  Moves extremities. No apparent deformity.  Trace BLE edema. SKIN: no apparent skin lesion or wound NEURO: Awake and alert. Oriented x4.  No apparent focal neuro deficit. PSYCH: Calm. Normal affect.   Procedures:  None  Microbiology summarized: Blood cultures NGTD Urine culture with GNR  Assessment and plan: Principal Problem:   Acute metabolic encephalopathy Active Problems:   Hyperammonemia /Hepatic encephalopathy   UTI (urinary tract infection)   Mixed hyperlipidemia   Anxiety and depression   Essential hypertension   GERD (gastroesophageal reflux disease)   Obesity, Class III, BMI 40-49.9 (morbid obesity) (HCC)   Hypothyroidism   Chronic respiratory failure with hypoxia (HCC)   Asthma, chronic   Acute on chronic diastolic CHF (congestive heart failure) (HCC)  Acute metabolic encephalopathy: Multifactorial including hepatic encephalopathy and possible UTI.  She reports inconsistence use of lactulose due to encephalopathy.  Ammonia elevated to 58.  TSH and B12 within normal.  UA and urine culture suggests UTI.  Encephalopathy seems to have resolved.  She is oriented x4. -Increased lactulose to 20 g 3 times daily. -Continue ceftriaxone for UTI pending urine culture speciation and sensitivity. -Reorientation and delirium precautions. -Avoid or minimize sedating medications  Acute diastolic CHF: Daughter reports progressive PND and orthopnea over the last  few months.  No formal diagnosis of CHF.  Last TTE in 11/2021 with LVEF of 65 to 70% and indeterminate DD.  CXR concerning for pulmonary vascular congestion and small bilateral pleural effusion.  BNP was normal but could be falsely low given body habitus/obesity.  Started IV Lasix on 10/12.  I and O incomplete.  Some improvement on CXR. -IV Lasix with IV albumin every 12 hours -Increase p.o. Aldactone to 100  mg. -Strict intake and output, renal functions and electrolytes. -Hold home HCTZ and losartan while on IV diuretics -Repeat CXR in 1 to 2 days to see improvement   Gram-negative UTI: She reports worsening urge incontinence -Continue ceftriaxone pending urine culture speciation and sensitivity -Recommend outpatient follow-up with urology for urge incontinence if no improvement after UTI treatment   Hypokalemia/hypomagnesemia: K 3.1.  -P.o. KCl 40x1 -Increase Aldactone to 100 mg -Recheck in the morning  NASH cirrhosis/elevated AST/hyperbilirubinemia: Mild AST and bili elevation -Continue diuretics and lactulose as above -Continue home rifaximin   Asthma, chronic/chronic hypoxic RF on 3 L: Reports intermittent SOB and cough.  Symptoms could be due to CHF.  ABG with respiratory alkalosis. -Continue LABA/ICS/LAMA -As needed DuoNeb   Hypothyroidism: TSH within normal. -Continue home Synthroid   GERD -Continue PPI   Essential hypertension: BP within acceptable range. -Continue home meds and diuretics   Mixed hyperlipidemia -Continue home Crestor  Generalized weakness/physical deconditioning -PT/OT eval  Normocytic anemia/thrombocytopenia: Likely due to liver cirrhosis.  Anemia panel suggests anemia of chronic disease -Monitor.  Hypoalbuminemia -IV albumin as above.  Morbid obesity Body mass index is 45.83 kg/m.          DVT prophylaxis:  heparin injection 5,000 Units Start: 04/19/22 2200 SCDs Start: 04/19/22 2058  Code Status: Full code Family Communication: Updated patient's daughter over the phone. Level of care: Telemetry Status is: Inpatient The patient will remain inpatient because: Acute encephalopathy, acute diastolic CHF, electrolyte derangements and possible UTI   Final disposition: TBD Consultants:  None  Sch Meds:  Scheduled Meds:  aspirin EC  81 mg Oral Daily   DULoxetine  60 mg Oral Daily   furosemide  40 mg Intravenous BID   heparin  5,000  Units Subcutaneous Q8H   lactulose  20 g Oral TID   levothyroxine  75 mcg Oral Q0600   mometasone-formoterol  2 puff Inhalation BID   pantoprazole  40 mg Oral Daily   rifaximin  550 mg Oral BID   rosuvastatin  10 mg Oral Daily   spironolactone  100 mg Oral Daily   Continuous Infusions:  albumin human 25 g (04/21/22 1001)   cefTRIAXone (ROCEPHIN)  IV Stopped (04/20/22 1834)   PRN Meds:.acetaminophen **OR** acetaminophen, ipratropium-albuterol, meclizine, ondansetron **OR** ondansetron (ZOFRAN) IV, oxyCODONE  Antimicrobials: Anti-infectives (From admission, onward)    Start     Dose/Rate Route Frequency Ordered Stop   04/20/22 1800  cefTRIAXone (ROCEPHIN) 1 g in sodium chloride 0.9 % 100 mL IVPB        1 g 200 mL/hr over 30 Minutes Intravenous Every 24 hours 04/20/22 0134     04/19/22 2200  rifaximin (XIFAXAN) tablet 550 mg        550 mg Oral 2 times daily 04/19/22 2057     04/19/22 1845  cefTRIAXone (ROCEPHIN) 1 g in sodium chloride 0.9 % 100 mL IVPB        1 g 200 mL/hr over 30 Minutes Intravenous  Once 04/19/22 1838 04/19/22 1956   04/19/22 1845  doxycycline (VIBRA-TABS)  tablet 100 mg        100 mg Oral  Once 04/19/22 1838 04/19/22 1912        I have personally reviewed the following labs and images: CBC: Recent Labs  Lab 04/19/22 1709 04/19/22 1718 04/20/22 0307 04/21/22 0345  WBC 12.1*  --  7.0 5.4  NEUTROABS  --   --  5.2  --   HGB 9.3* 9.9* 8.9* 9.2*  HCT 28.7* 29.0* 27.0* 28.0*  MCV 96.6  --  95.7 97.2  PLT 116*  --  99* 100*   BMP &GFR Recent Labs  Lab 04/19/22 1709 04/19/22 1718 04/20/22 0307 04/21/22 0345  NA 133* 140 133* 136  K 2.9* 3.1* 2.8* 3.1*  CL 102 97* 102 105  CO2 28  --  29 29  GLUCOSE 93 92 143* 115*  BUN 9 7* 9 8  CREATININE 0.65 0.50 0.61 0.58  CALCIUM 7.5*  --  7.8* 7.9*  MG  --   --  1.6* 1.8  PHOS  --   --   --  3.0   Estimated Creatinine Clearance: 87.6 mL/min (by C-G formula based on SCr of 0.58 mg/dL). Liver &  Pancreas: Recent Labs  Lab 04/19/22 1709 04/20/22 0307 04/21/22 0345  AST 60* 56*  --   ALT 25 23  --   ALKPHOS 88 79  --   BILITOT 1.8* 1.6*  --   PROT 9.2* 8.5*  --   ALBUMIN <1.5* <1.5* <1.5*   Recent Labs  Lab 04/19/22 1709  LIPASE 35   Recent Labs  Lab 04/19/22 1709  AMMONIA 58*   Diabetic: No results for input(s): "HGBA1C" in the last 72 hours. Recent Labs  Lab 04/19/22 1645  GLUCAP 94   Cardiac Enzymes: No results for input(s): "CKTOTAL", "CKMB", "CKMBINDEX", "TROPONINI" in the last 168 hours. No results for input(s): "PROBNP" in the last 8760 hours. Coagulation Profile: No results for input(s): "INR", "PROTIME" in the last 168 hours. Thyroid Function Tests: Recent Labs    04/20/22 0307  TSH 3.861   Lipid Profile: No results for input(s): "CHOL", "HDL", "LDLCALC", "TRIG", "CHOLHDL", "LDLDIRECT" in the last 72 hours. Anemia Panel: Recent Labs    04/20/22 0307  VITAMINB12 365  FOLATE 5.4*  FERRITIN 116  TIBC 142*  IRON 58  RETICCTPCT 3.3*   Urine analysis:    Component Value Date/Time   COLORURINE AMBER (A) 04/19/2022 1927   APPEARANCEUR HAZY (A) 04/19/2022 1927   APPEARANCEUR Clear 03/08/2022 1549   LABSPEC 1.012 04/19/2022 1927   PHURINE 6.0 04/19/2022 1927   GLUCOSEU NEGATIVE 04/19/2022 1927   HGBUR MODERATE (A) 04/19/2022 1927   BILIRUBINUR NEGATIVE 04/19/2022 1927   BILIRUBINUR Negative 03/08/2022 [place] 04/19/2022 1927   PROTEINUR 30 (A) 04/19/2022 1927   UROBILINOGEN negative (A) 07/02/2019 0932   UROBILINOGEN 0.2 04/15/2015 0028   NITRITE POSITIVE (A) 04/19/2022 1927   LEUKOCYTESUR MODERATE (A) 04/19/2022 1927   Sepsis Labs: Invalid input(s): "PROCALCITONIN", "LACTICIDVEN"  Microbiology: Recent Results (from the past 240 hour(s))  Blood culture (routine x 2)     Status: None (Preliminary result)   Collection Time: 04/19/22  5:09 PM   Specimen: Left Antecubital; Blood  Result Value Ref Range Status    Specimen Description LEFT ANTECUBITAL  Final   Special Requests   Final    BOTTLES DRAWN AEROBIC AND ANAEROBIC Blood Culture adequate volume   Culture   Final    NO GROWTH 2 DAYS Performed at  [facility], [address], [place], [place] [address]    Report Status PENDING  Incomplete  Blood culture (routine x 2)     Status: None (Preliminary result)   Collection Time: 04/19/22  5:20 PM   Specimen: BLOOD RIGHT ARM  Result Value Ref Range Status   Specimen Description BLOOD RIGHT ARM  Final   Special Requests   Final    BOTTLES DRAWN AEROBIC AND ANAEROBIC Blood Culture adequate volume   Culture   Final    NO GROWTH 2 DAYS Performed at [facility], [address], [place], [place] [address]    Report Status PENDING  Incomplete  Urine Culture     Status: Abnormal (Preliminary result)   Collection Time: 04/19/22  7:27 PM   Specimen: Urine, Clean Catch  Result Value Ref Range Status   Specimen Description   Final    URINE, CLEAN CATCH Performed at [facility], [address], [place], [place] [address]    Special Requests   Final    NONE Performed at [facility], [address] [place], [place] [address]    Culture (A)  Final    >=100,000 COLONIES/mL GRAM NEGATIVE RODS SUSCEPTIBILITIES TO FOLLOW Performed at [facility], [place] [address] [place], [place] [address]    Report Status PENDING  Incomplete    Radiology Studies: DG Chest 2 View  Result Date: 04/21/2022 CLINICAL DATA:  Shortness of breath, pulmonary edema EXAM: CHEST - 2 VIEW COMPARISON:  04/19/2022 FINDINGS: Heart is mildly enlarged. Continued mild interstitial and airspace opacities with small bilateral effusions. No real change since prior study. No acute bony abnormality. IMPRESSION: Continued cardiomegaly with bilateral interstitial and airspace opacities, favor edema. Small bilateral effusions. Electronically Signed   By: Rolm Baptise M.D.   On: 04/21/2022 10:31      Enslee Bibbins T. Treasure  If 7PM-7AM, please contact night-coverage [email or web address] 04/21/2022, 1:33 PM

## 2022-04-21 NOTE — Progress Notes (Signed)
Physical Therapy Treatment Patient Details Name: Yolanda [name] MRN: [id number] DOB: [birth year] Today's Date: 04/21/2022   History of Present Illness Yolanda [name] is a 66 y.o. female with medical history significant of anxiety, depression, GERD, hypertension, hypothyroidism, liver cirrhosis secondary to [place], obesity, pulmonary embolism, sleep apnea, and more presents the ED with chief complaint of difficulty breathing and disoriented.  Patient reports that her daughter says her symptoms have been gradually progressing over 2 weeks.  Patient noticed an acute change this morning upon waking.  She remembers being confused.  She reports the orthopnea is normal for her.  She continues to have to prop himself up at night with otherwise having shortness of breath.  She denies any new cough or fever.  Patient reports that she has had some chest pain from right to left.  It is sharp when she is across her chest.  Today it was mostly on the right side.  It is only there for a few seconds at a time.  Patient reports that she feels dizzy.  She had some nausea but no vomiting.  She reports that she felt generally ill and washed out yesterday.     Patient reports that she supposed to take her lactulose twice a day but she has been taking his once a day.  Apparently when the daughter was here she was reporting to staff that 7 days patient is needing to take her lactulose.  Patient reports that she cannot tell us when her last bowel was because she cannot remember.    PT Comments    Patient seated EOB at beginning of session. Able to transfer and ambulate without physical assist needed and ambulates to commode and for increased distance in hallway. Patient limited by fatigue and SOB while on 3L. Patient returned to EOB at end of session. Patient will benefit from continued physical therapy in hospital and recommended venue below to increase strength, balance, endurance for safe ADLs and gait.     Recommendations for follow up therapy are one component of a multi-disciplinary discharge planning process, led by the attending physician.  Recommendations may be updated based on patient status, additional functional criteria and insurance authorization.  Follow Up Recommendations  No PT follow up     Assistance Recommended at Discharge Set up Supervision/Assistance  Patient can return home with the following A little help with walking and/or transfers;A little help with bathing/dressing/bathroom;Help with stairs or ramp for entrance;Assistance with cooking/housework   Equipment Recommendations  None recommended by PT    Recommendations for Other Services       Precautions / Restrictions Precautions Precautions: Fall Restrictions Weight Bearing Restrictions: No     Mobility  Bed Mobility               General bed mobility comments: seated EOB    Transfers Overall transfer level: Needs assistance Equipment used: Rolling walker (2 wheels) Transfers: Sit to/from Stand Sit to Stand: Supervision   Step pivot transfers: Supervision       General transfer comment: good return for transferring to/from EOB and toilet    Ambulation/Gait Ambulation/Gait assistance: Supervision, Min guard Gait Distance (Feet): 100 Feet Assistive device: Rolling walker (2 wheels) Gait Pattern/deviations: Decreased step length - right, Decreased step length - left, Decreased stride length Gait velocity: decreased     General Gait Details: slightly labored cadence without loss of balance, limited mostly due to fatigue   Stairs  Wheelchair Mobility    Modified Rankin (Stroke Patients Only)       Balance Overall balance assessment: Needs assistance Sitting-balance support: Feet supported, No upper extremity supported Sitting balance-Leahy Scale: Good Sitting balance - Comments: seated at EOB   Standing balance support: During functional activity, No upper  extremity supported Standing balance-Leahy Scale: Fair Standing balance comment: fair/good using RW                            Cognition Arousal/Alertness: Awake/alert Behavior During Therapy: WFL for tasks assessed/performed Overall Cognitive Status: Within Functional Limits for tasks assessed                                          Exercises      General Comments        Pertinent Vitals/Pain Pain Assessment Pain Assessment: 0-10 Pain Score: 5  Pain Location: left flank Pain Descriptors / Indicators: Sore Pain Intervention(s): Limited activity within patient's tolerance, Monitored during session, Repositioned    Home Living                          Prior Function            PT Goals (current goals can now be found in the care plan section) Acute Rehab PT Goals Patient Stated Goal: return home with family to assist PT Goal Formulation: With patient Time For Goal Achievement: 04/24/22 Potential to Achieve Goals: Good Progress towards PT goals: Progressing toward goals    Frequency    Min 3X/week      PT Plan      Co-evaluation              AM-PAC PT "6 Clicks" Mobility   Outcome Measure  Help needed turning from your back to your side while in a flat bed without using bedrails?: None Help needed moving from lying on your back to sitting on the side of a flat bed without using bedrails?: None Help needed moving to and from a bed to a chair (including a wheelchair)?: A Little Help needed standing up from a chair using your arms (e.g., wheelchair or bedside chair)?: None Help needed to walk in hospital room?: A Little Help needed climbing 3-5 steps with a railing? : A Little 6 Click Score: 21    End of Session Equipment Utilized During Treatment: Oxygen Activity Tolerance: Patient tolerated treatment well;Patient limited by fatigue Patient left: with call bell/phone within reach;in bed;with family/visitor  present Nurse Communication: Mobility status PT Visit Diagnosis: Unsteadiness on feet (R26.81);Muscle weakness (generalized) (M62.81);Other abnormalities of gait and mobility (R26.89)     Time: 7124-5809 PT Time Calculation (min) (ACUTE ONLY): 23 min  Charges:  $Therapeutic Activity: 23-37 mins                    2:47 PM, 04/21/22 Mearl Latin PT, DPT Physical Therapist at [facility]

## 2022-04-21 NOTE — Care Management Important Message (Signed)
Important Message  Patient Details  Name: Yolanda [name] MRN: [id number] Date of Birth: 05/18/1956   Medicare Important Message Given:  Yes     Tommy Medal 04/21/2022, 12:45 PM

## 2022-04-22 DIAGNOSIS — E7220 Disorder of urea cycle metabolism, unspecified: Secondary | ICD-10-CM | POA: Diagnosis not present

## 2022-04-22 DIAGNOSIS — I5033 Acute on chronic diastolic (congestive) heart failure: Secondary | ICD-10-CM | POA: Diagnosis not present

## 2022-04-22 DIAGNOSIS — F419 Anxiety disorder, unspecified: Secondary | ICD-10-CM | POA: Diagnosis not present

## 2022-04-22 DIAGNOSIS — G9341 Metabolic encephalopathy: Secondary | ICD-10-CM | POA: Diagnosis not present

## 2022-04-22 LAB — URINALYSIS, ROUTINE W REFLEX MICROSCOPIC
Bilirubin Urine: NEGATIVE
Glucose, UA: NEGATIVE mg/dL
Ketones, ur: NEGATIVE mg/dL
Nitrite: NEGATIVE
Protein, ur: NEGATIVE mg/dL
Specific Gravity, Urine: 1.016 (ref 1.005–1.030)
pH: 5 (ref 5.0–8.0)

## 2022-04-22 LAB — COMPREHENSIVE METABOLIC PANEL
ALT: 23 U/L (ref 0–44)
ALT: 22 U/L (ref 0–44)
AST: 56 U/L — ABNORMAL HIGH (ref 15–41)
AST: 48 U/L — ABNORMAL HIGH (ref 15–41)
Albumin: <1.5 g/dL — ABNORMAL LOW (ref 3.5–5.0)
Albumin: <1.5 g/dL — ABNORMAL LOW (ref 3.5–5.0)
Alkaline Phosphatase: 79 U/L (ref 38–126)
Alkaline Phosphatase: 79 U/L (ref 38–126)
Anion gap: 2 — ABNORMAL LOW (ref 5–15)
Anion gap: 2 — ABNORMAL LOW (ref 5–15)
BUN: 9 mg/dL (ref 8–23)
BUN: 9 mg/dL (ref 8–23)
CO2: 29 mmol/L (ref 22–32)
CO2: 30 mmol/L (ref 22–32)
Calcium: 7.8 mg/dL — ABNORMAL LOW (ref 8.9–10.3)
Calcium: 8 mg/dL — ABNORMAL LOW (ref 8.9–10.3)
Chloride: 102 mmol/L (ref 98–111)
Chloride: 102 mmol/L (ref 98–111)
Creatinine, Ser: 0.61 mg/dL (ref 0.44–1.00)
Creatinine, Ser: 0.56 mg/dL (ref 0.44–1.00)
GFR, Estimated: >60 mL/min (ref >60)
GFR, Estimated: >60 mL/min (ref >60)
Glucose, Bld: 143 mg/dL — ABNORMAL HIGH (ref 70–99)
Glucose, Bld: 128 mg/dL — ABNORMAL HIGH (ref 70–99)
Potassium: 2.8 mmol/L — ABNORMAL LOW (ref 3.5–5.1)
Potassium: 3 mmol/L — ABNORMAL LOW (ref 3.5–5.1)
Sodium: 133 mmol/L — ABNORMAL LOW (ref 135–145)
Sodium: 134 mmol/L — ABNORMAL LOW (ref 135–145)
Total Bilirubin: 1.6 mg/dL — ABNORMAL HIGH (ref 0.3–1.2)
Total Bilirubin: 0.7 mg/dL (ref 0.3–1.2)
Total Protein: 8.5 g/dL — ABNORMAL HIGH (ref 6.5–8.1)
Total Protein: 8.3 g/dL — ABNORMAL HIGH (ref 6.5–8.1)

## 2022-04-22 LAB — CBC
HCT: 29 % — ABNORMAL LOW (ref 36.0–46.0)
Hemoglobin: 9.5 g/dL — ABNORMAL LOW (ref 12.0–15.0)
MCH: 32 pg (ref 26.0–34.0)
MCHC: 32.8 g/dL (ref 30.0–36.0)
MCV: 97.6 fL (ref 80.0–100.0)
Platelets: 110 10*3/uL — ABNORMAL LOW (ref 150–400)
RBC: 2.97 MIL/uL — ABNORMAL LOW (ref 3.87–5.11)
RDW: 15.8 % — ABNORMAL HIGH (ref 11.5–15.5)
WBC: 5.8 10*3/uL (ref 4.0–10.5)
nRBC: 0 % (ref 0.0–0.2)

## 2022-04-22 LAB — URINE CULTURE: Culture: >=100000 — AB

## 2022-04-22 LAB — MAGNESIUM: Magnesium: 1.6 mg/dL — ABNORMAL LOW (ref 1.7–2.4)

## 2022-04-22 LAB — PHOSPHORUS: Phosphorus: 3.2 mg/dL (ref 2.5–4.6)

## 2022-04-22 LAB — AMMONIA: Ammonia: 58 umol/L — ABNORMAL HIGH (ref 9–35)

## 2022-04-22 MED ORDER — POTASSIUM CHLORIDE CRYS ER 20 MEQ PO TBCR
40.0000 meq | EXTENDED_RELEASE_TABLET | ORAL | Status: AC
  Administered 2022-04-22 (×2): 40 meq via ORAL
  Filled 2022-04-22 (×2): qty 2

## 2022-04-22 MED ORDER — MAGNESIUM SULFATE 2 GM/50ML IV SOLN
2.0000 g | Freq: Once | INTRAVENOUS | Status: AC
  Administered 2022-04-22: 2 g via INTRAVENOUS
  Filled 2022-04-22: qty 50

## 2022-04-22 NOTE — Progress Notes (Signed)
Pt had o2 off upon entering room spo2 82% on room air instruct patient that she needs to keep o2 on. O2 back on at 3lpm cann spo2 96% on 3lpm cann

## 2022-04-22 NOTE — Progress Notes (Signed)
Patient is upset stating she can not breathe. Patient's O2 on 2L is 94%. Placed patient back on 3L per MD Gonfa because patient is used to that at home. Patient is more comfortable on 3L.

## 2022-04-22 NOTE — Progress Notes (Signed)
PROGRESS NOTE  Yolanda [name] NFA:[id number] DOB: 11/02/1955   PCP: Celene Squibb, MD  Patient is from: Home.  Lives with daughter.  Uses rollator at baseline.  DOA: 04/19/2022 LOS: 2  Chief complaints Chief Complaint  Patient presents with   Altered Mental Status     Brief Narrative / Interim history: 66 year old F with PMH of NASH liver cirrhosis, morbid obesity, PE not on AC, OSA, HTN, anxiety, depression, GERD, hypothyroidism and psoriatic arthritis presenting with progressive shortness of breath, disorientation, fatigue and nausea, and admitted for acute metabolic encephalopathy in the setting of hepatic encephalopathy and possible UTI.   In ED, stable vitals.  Na 133.  K2.9.  AST 60.  Total bili 1.8.  WBC 12.1.  Hgb 9.3.  Platelet 116.  Ammonia 58.  Lactic acid 2.5.  BNP 81.  UA concerning for UTI.  UDS negative.  CXR showed cardiomegaly with small pleural effusions and mixed interstitial and air space opacities which may reflect edema versus pneumonia.  MRI brain without acute finding.  Cultures obtained.  Patient was started on IV ceftriaxone for possible UTI and lactulose for hepatic encephalopathy, and admitted.  Patient is on lactulose, antibiotics for UTI and diuretics for CHF/pleural effusion.  Likely home on 10/15.   Subjective: Seen and examined earlier this morning.  No major events overnight of this morning.  She says she feels like she is out in space.  She is not able to describe this.  Eating is better.  She reports left back pain.  Concerned about having kidney stone.  She had right back pain yesterday.  Denies blood in urine.  Patient's daughter at bedside.   Objective: Vitals:   04/22/22 0534 04/22/22 0624 04/22/22 0811 04/22/22 1329  BP: (!) 164/76   (!) 144/68  Pulse: 100   (!) 102  Resp: 18     Temp: 98.3 F (36.8 C)     TempSrc:      SpO2: 99%  98% 99%  Weight:  102.6 kg    Height:        Examination: GENERAL: No apparent distress.   Nontoxic. HEENT: MMM.  Vision and hearing grossly intact.  NECK: Supple.  No apparent JVD.  RESP:  No IWOB.  Fair aeration bilaterally but limited exam due to body habitus. CVS:  RRR. Heart sounds normal.  ABD/GI/GU: BS+. Abd soft, NTND.  No CVA tenderness. MSK/EXT:  Moves extremities.  Tenderness over left thoracolumbar paraspinal muscles SKIN: no apparent skin lesion or wound NEURO: Awake and alert. Oriented appropriately.  No apparent focal neuro deficit. PSYCH: Calm. Normal affect.   Procedures:  None  Microbiology summarized: Blood cultures NGTD Urine culture with GNR  Assessment and plan: Principal Problem:   Acute metabolic encephalopathy Active Problems:   Hyperammonemia /Hepatic encephalopathy   UTI (urinary tract infection)   Mixed hyperlipidemia   Anxiety and depression   Essential hypertension   GERD (gastroesophageal reflux disease)   Obesity, Class III, BMI 40-49.9 (morbid obesity) (HCC)   Hypothyroidism   Chronic respiratory failure with hypoxia (HCC)   Asthma, chronic   Acute on chronic diastolic CHF (congestive heart failure) (HCC)  Acute metabolic encephalopathy: Multifactorial including hepatic encephalopathy and possible UTI.  She reports inconsistence use of lactulose due to encephalopathy.  Ammonia elevated to 58.  TSH and B12 within normal.  UA and urine culture suggests UTI.  Encephalopathy seems to have resolved.  She is oriented x4. -Continue lactulose to 20 g 3 times daily. -Continue ceftriaxone  for UTI  -Reorientation and delirium precautions. -Avoid or minimize sedating medications  Acute diastolic CHF: Daughter reports progressive PND and orthopnea over the last few months.  No formal diagnosis of CHF.  Last TTE in 11/2021 with LVEF of 65 to 70% and indeterminate DD.  CXR concerning for pulmonary vascular congestion and small bilateral pleural effusion.  BNP was normal but could be falsely low given body habitus/obesity.  Started IV Lasix on 10/12.   I and O incomplete.  Some improvement clinically and radiologically. -Continue IV Lasix with IV albumin every 12 hours today -Continue Aldactone at 100 mg daily -Strict intake and output.  Discussed with RN. -Hold home HCTZ and losartan while on IV diuretics.  May discontinue dose on discharge -Repeat CXR prior to discharge.   Urinary tract infection: She reports worsening urge incontinence.  Urine culture with Enterobacter aerogenes.  -Continue ceftriaxone -Recommend outpatient follow-up with urology for urge incontinence if no improvement after UTI treatment   Hypokalemia/hypomagnesemia: K 3.0.  Mg 1.6. -P.o. KCl 40x2 -IV magnesium sulfate 2 g x 1  NASH cirrhosis/elevated AST/hyperbilirubinemia: Mild AST and bili elevation -Continue diuretics and lactulose as above -Continue home rifaximin   Asthma, chronic/chronic hypoxic RF on 3 L: Reports intermittent SOB and cough.  Symptoms could be due to CHF.  ABG with respiratory alkalosis. -Continue LABA/ICS/LAMA -As needed DuoNeb -Wean oxygen as able.  Minimum oxygen to keep saturation above 90% despite home 3 L   Hypothyroidism: TSH within normal. -Continue home Synthroid   GERD -Continue PPI   Essential hypertension: BP within acceptable range. -Diuretics as above.   Mixed hyperlipidemia -Continue home Crestor  Generalized weakness/physical deconditioning -PT/OT eval  Normocytic anemia/thrombocytopenia: Likely due to liver cirrhosis.  Anemia panel suggests anemia of chronic disease -Monitor.  Hypoalbuminemia -IV albumin as above.  Morbid obesity Body mass index is 38.83 kg/m.          DVT prophylaxis:  heparin injection 5,000 Units Start: 04/19/22 2200 SCDs Start: 04/19/22 2058  Code Status: Full code Family Communication: Updated patient's daughter at bedside. Level of care: Telemetry Status is: Inpatient The patient will remain inpatient because: Acute encephalopathy, acute diastolic CHF, electrolyte  derangements and UTI   Final disposition: Likely home in the next 24 hours. Consultants:  None  Sch Meds:  Scheduled Meds:  aspirin EC  81 mg Oral Daily   DULoxetine  60 mg Oral Daily   furosemide  40 mg Intravenous BID   heparin  5,000 Units Subcutaneous Q8H   lactulose  20 g Oral TID   levothyroxine  75 mcg Oral Q0600   mometasone-formoterol  2 puff Inhalation BID   pantoprazole  40 mg Oral Daily   rifaximin  550 mg Oral BID   rosuvastatin  10 mg Oral Daily   spironolactone  100 mg Oral Daily   Continuous Infusions:  albumin human 25 g (04/22/22 1131)   cefTRIAXone (ROCEPHIN)  IV 1 g (04/21/22 1722)   PRN Meds:.acetaminophen **OR** acetaminophen, ipratropium-albuterol, meclizine, ondansetron **OR** ondansetron (ZOFRAN) IV  Antimicrobials: Anti-infectives (From admission, onward)    Start     Dose/Rate Route Frequency Ordered Stop   04/20/22 1800  cefTRIAXone (ROCEPHIN) 1 g in sodium chloride 0.9 % 100 mL IVPB        1 g 200 mL/hr over 30 Minutes Intravenous Every 24 hours 04/20/22 0134     04/19/22 2200  rifaximin (XIFAXAN) tablet 550 mg        550 mg Oral 2 times daily  04/19/22 2057     04/19/22 1845  cefTRIAXone (ROCEPHIN) 1 g in sodium chloride 0.9 % 100 mL IVPB        1 g 200 mL/hr over 30 Minutes Intravenous  Once 04/19/22 1838 04/19/22 1956   04/19/22 1845  doxycycline (VIBRA-TABS) tablet 100 mg        100 mg Oral  Once 04/19/22 1838 04/19/22 1912        I have personally reviewed the following labs and images: CBC: Recent Labs  Lab 04/19/22 1709 04/19/22 1718 04/20/22 0307 04/21/22 0345 04/22/22 0347  WBC 12.1*  --  7.0 5.4 5.8  NEUTROABS  --   --  5.2  --   --   HGB 9.3* 9.9* 8.9* 9.2* 9.5*  HCT 28.7* 29.0* 27.0* 28.0* 29.0*  MCV 96.6  --  95.7 97.2 97.6  PLT 116*  --  99* 100* 110*   BMP &GFR Recent Labs  Lab 04/19/22 1709 04/19/22 1718 04/20/22 0307 04/21/22 0345 04/22/22 0347  NA 133* 140 133* 136 134*  K 2.9* 3.1* 2.8* 3.1* 3.0*   CL 102 97* 102 105 102  CO2 28  --  29 29 30   GLUCOSE 93 92 143* 115* 128*  BUN 9 7* 9 8 9   CREATININE 0.65 0.50 0.61 0.58 0.56  CALCIUM 7.5*  --  7.8* 7.9* 8.0*  MG  --   --  1.6* 1.8 1.6*  PHOS  --   --   --  3.0 3.2   Estimated Creatinine Clearance: 79.6 mL/min (by C-G formula based on SCr of 0.56 mg/dL). Liver & Pancreas: Recent Labs  Lab 04/19/22 1709 04/20/22 0307 04/21/22 0345 04/22/22 0347  AST 60* 56*  --  48*  ALT 25 23  --  22  ALKPHOS 88 79  --  79  BILITOT 1.8* 1.6*  --  0.7  PROT 9.2* 8.5*  --  8.3*  ALBUMIN <1.5* <1.5* <1.5* <1.5*   Recent Labs  Lab 04/19/22 1709  LIPASE 35   Recent Labs  Lab 04/19/22 1709 04/22/22 0347  AMMONIA 58* 58*   Diabetic: No results for input(s): "HGBA1C" in the last 72 hours. Recent Labs  Lab 04/19/22 1645  GLUCAP 94   Cardiac Enzymes: No results for input(s): "CKTOTAL", "CKMB", "CKMBINDEX", "TROPONINI" in the last 168 hours. No results for input(s): "PROBNP" in the last 8760 hours. Coagulation Profile: No results for input(s): "INR", "PROTIME" in the last 168 hours. Thyroid Function Tests: Recent Labs    04/20/22 0307  TSH 3.861   Lipid Profile: No results for input(s): "CHOL", "HDL", "LDLCALC", "TRIG", "CHOLHDL", "LDLDIRECT" in the last 72 hours. Anemia Panel: Recent Labs    04/20/22 0307  VITAMINB12 365  FOLATE 5.4*  FERRITIN 116  TIBC 142*  IRON 58  RETICCTPCT 3.3*   Urine analysis:    Component Value Date/Time   COLORURINE AMBER (A) 04/19/2022 1927   APPEARANCEUR HAZY (A) 04/19/2022 1927   APPEARANCEUR Clear 03/08/2022 1549   LABSPEC 1.012 04/19/2022 1927   PHURINE 6.0 04/19/2022 1927   GLUCOSEU NEGATIVE 04/19/2022 1927   HGBUR MODERATE (A) 04/19/2022 1927   BILIRUBINUR NEGATIVE 04/19/2022 1927   BILIRUBINUR Negative 03/08/2022 [place] 04/19/2022 1927   PROTEINUR 30 (A) 04/19/2022 1927   UROBILINOGEN negative (A) 07/02/2019 0932   UROBILINOGEN 0.2 04/15/2015 0028    NITRITE POSITIVE (A) 04/19/2022 1927   LEUKOCYTESUR MODERATE (A) 04/19/2022 1927   Sepsis Labs: Invalid input(s): "PROCALCITONIN", "LACTICIDVEN"  Microbiology: Recent  Results (from the past 240 hour(s))  Blood culture (routine x 2)     Status: None (Preliminary result)   Collection Time: 04/19/22  5:09 PM   Specimen: Left Antecubital; Blood  Result Value Ref Range Status   Specimen Description LEFT ANTECUBITAL  Final   Special Requests   Final    BOTTLES DRAWN AEROBIC AND ANAEROBIC Blood Culture adequate volume   Culture   Final    NO GROWTH 3 DAYS Performed at [facility], [address] [place], [place] [address]    Report Status PENDING  Incomplete  Blood culture (routine x 2)     Status: None (Preliminary result)   Collection Time: 04/19/22  5:20 PM   Specimen: BLOOD RIGHT ARM  Result Value Ref Range Status   Specimen Description BLOOD RIGHT ARM  Final   Special Requests   Final    BOTTLES DRAWN AEROBIC AND ANAEROBIC Blood Culture adequate volume   Culture   Final    NO GROWTH 3 DAYS Performed at [facility], [address] [place], [place] [address]    Report Status PENDING  Incomplete  Urine Culture     Status: Abnormal   Collection Time: 04/19/22  7:27 PM   Specimen: Urine, Clean Catch  Result Value Ref Range Status   Specimen Description   Final    URINE, CLEAN CATCH Performed at [facility], [address], [place], [place] [address]    Special Requests   Final    NONE Performed at [facility], [address] [place], [place] [address]    Culture >=100,000 COLONIES/mL ENTEROBACTER AEROGENES (A)  Final   Report Status 04/22/2022 FINAL  Final   Organism ID, Bacteria ENTEROBACTER AEROGENES (A)  Final      Susceptibility   Enterobacter aerogenes - MIC*    CEFAZOLIN >=64 RESISTANT Resistant     CEFEPIME <=0.12 SENSITIVE Sensitive     CEFTRIAXONE <=0.25 SENSITIVE Sensitive     CIPROFLOXACIN <=0.25 SENSITIVE Sensitive     GENTAMICIN <=1 SENSITIVE Sensitive      IMIPENEM 0.5 SENSITIVE Sensitive     NITROFURANTOIN 64 INTERMEDIATE Intermediate     TRIMETH/SULFA <=20 SENSITIVE Sensitive     PIP/TAZO <=4 SENSITIVE Sensitive     * >=100,000 COLONIES/mL ENTEROBACTER AEROGENES    Radiology Studies: No results found.    Benancio Osmundson T. Freeborn  If 7PM-7AM, please contact night-coverage [email or web address] 04/22/2022, 2:52 PM

## 2022-04-23 ENCOUNTER — Inpatient Hospital Stay (HOSPITAL_COMMUNITY): Payer: Medicare Other

## 2022-04-23 DIAGNOSIS — R779 Abnormality of plasma protein, unspecified: Secondary | ICD-10-CM

## 2022-04-23 DIAGNOSIS — F419 Anxiety disorder, unspecified: Secondary | ICD-10-CM | POA: Diagnosis not present

## 2022-04-23 DIAGNOSIS — E7220 Disorder of urea cycle metabolism, unspecified: Secondary | ICD-10-CM | POA: Diagnosis not present

## 2022-04-23 DIAGNOSIS — G9341 Metabolic encephalopathy: Secondary | ICD-10-CM | POA: Diagnosis not present

## 2022-04-23 DIAGNOSIS — I5033 Acute on chronic diastolic (congestive) heart failure: Secondary | ICD-10-CM | POA: Diagnosis not present

## 2022-04-23 DIAGNOSIS — J90 Pleural effusion, not elsewhere classified: Secondary | ICD-10-CM

## 2022-04-23 LAB — COMPREHENSIVE METABOLIC PANEL
ALT: 21 U/L (ref 0–44)
AST: 46 U/L — ABNORMAL HIGH (ref 15–41)
Albumin: 2.1 g/dL — ABNORMAL LOW (ref 3.5–5.0)
Alkaline Phosphatase: 77 U/L (ref 38–126)
Anion gap: 5 (ref 5–15)
BUN: 9 mg/dL (ref 8–23)
CO2: 26 mmol/L (ref 22–32)
Calcium: 8.3 mg/dL — ABNORMAL LOW (ref 8.9–10.3)
Chloride: 101 mmol/L (ref 98–111)
Creatinine, Ser: 0.57 mg/dL (ref 0.44–1.00)
GFR, Estimated: >60 mL/min (ref >60)
Glucose, Bld: 111 mg/dL — ABNORMAL HIGH (ref 70–99)
Potassium: 3.6 mmol/L (ref 3.5–5.1)
Sodium: 132 mmol/L — ABNORMAL LOW (ref 135–145)
Total Bilirubin: 1.7 mg/dL — ABNORMAL HIGH (ref 0.3–1.2)
Total Protein: 8.7 g/dL — ABNORMAL HIGH (ref 6.5–8.1)

## 2022-04-23 LAB — TROPONIN I (HIGH SENSITIVITY)
Troponin I (High Sensitivity): 8 ng/L (ref <18)
Troponin I (High Sensitivity): 7 ng/L (ref <18)

## 2022-04-23 LAB — PROCALCITONIN: Procalcitonin: 0.34 ng/mL

## 2022-04-23 LAB — BLOOD GAS, VENOUS
Acid-Base Excess: 6.5 mmol/L — ABNORMAL HIGH (ref 0.0–2.0)
Bicarbonate: 32.8 mmol/L — ABNORMAL HIGH (ref 20.0–28.0)
Drawn by: 27160
O2 Saturation: 62.3 %
Patient temperature: 36.9
pCO2, Ven: 53 mmHg (ref 44–60)
pH, Ven: 7.4 (ref 7.25–7.43)
pO2, Ven: 34 mmHg (ref 32–45)

## 2022-04-23 LAB — CBC
HCT: 29.6 % — ABNORMAL LOW (ref 36.0–46.0)
Hemoglobin: 9.6 g/dL — ABNORMAL LOW (ref 12.0–15.0)
MCH: 31.3 pg (ref 26.0–34.0)
MCHC: 32.4 g/dL (ref 30.0–36.0)
MCV: 96.4 fL (ref 80.0–100.0)
Platelets: 124 10*3/uL — ABNORMAL LOW (ref 150–400)
RBC: 3.07 MIL/uL — ABNORMAL LOW (ref 3.87–5.11)
RDW: 15.9 % — ABNORMAL HIGH (ref 11.5–15.5)
WBC: 6.7 10*3/uL (ref 4.0–10.5)
nRBC: 0 % (ref 0.0–0.2)

## 2022-04-23 LAB — AMMONIA: Ammonia: 27 umol/L (ref 9–35)

## 2022-04-23 LAB — GLUCOSE, CAPILLARY
Glucose-Capillary: 96 mg/dL (ref 70–99)
Glucose-Capillary: 138 mg/dL — ABNORMAL HIGH (ref 70–99)

## 2022-04-23 LAB — LACTATE DEHYDROGENASE: LDH: 174 U/L (ref 98–192)

## 2022-04-23 LAB — BRAIN NATRIURETIC PEPTIDE: B Natriuretic Peptide: 64 pg/mL (ref 0.0–100.0)

## 2022-04-23 LAB — MAGNESIUM: Magnesium: 1.8 mg/dL (ref 1.7–2.4)

## 2022-04-23 LAB — PHOSPHORUS: Phosphorus: 3.1 mg/dL (ref 2.5–4.6)

## 2022-04-23 MED ORDER — IOHEXOL 350 MG/ML SOLN
75.0000 mL | Freq: Once | INTRAVENOUS | Status: AC | PRN
  Administered 2022-04-23: 75 mL via INTRAVENOUS

## 2022-04-23 MED ORDER — ALBUMIN HUMAN 25 % IV SOLN
25.0000 g | Freq: Two times a day (BID) | INTRAVENOUS | Status: DC
  Administered 2022-04-23: 25 g via INTRAVENOUS
  Filled 2022-04-23: qty 100

## 2022-04-23 MED ORDER — FLUCONAZOLE 150 MG PO TABS
150.0000 mg | ORAL_TABLET | Freq: Once | ORAL | Status: AC
  Administered 2022-04-23: 150 mg via ORAL
  Filled 2022-04-23: qty 1

## 2022-04-23 MED ORDER — LORAZEPAM 2 MG/ML IJ SOLN
0.5000 mg | Freq: Once | INTRAMUSCULAR | Status: AC
  Administered 2022-04-23: 0.5 mg via INTRAVENOUS
  Filled 2022-04-23: qty 1

## 2022-04-23 MED ORDER — SODIUM CHLORIDE 0.9 % IV SOLN
2.0000 g | Freq: Three times a day (TID) | INTRAVENOUS | Status: DC
  Administered 2022-04-23 – 2022-04-27 (×12): 2 g via INTRAVENOUS
  Filled 2022-04-23 (×12): qty 12.5

## 2022-04-23 MED ORDER — VANCOMYCIN HCL 1250 MG/250ML IV SOLN: 1250.0000 mg | INTRAVENOUS | Status: DC

## 2022-04-23 MED ORDER — FUROSEMIDE 10 MG/ML IJ SOLN
60.0000 mg | Freq: Two times a day (BID) | INTRAMUSCULAR | Status: DC
  Administered 2022-04-23: 60 mg via INTRAVENOUS
  Filled 2022-04-23: qty 6

## 2022-04-23 MED ORDER — METHYLPREDNISOLONE SODIUM SUCC 125 MG IJ SOLR
125.0000 mg | Freq: Once | INTRAMUSCULAR | Status: AC
  Administered 2022-04-23: 125 mg via INTRAVENOUS
  Filled 2022-04-23: qty 2

## 2022-04-23 MED ORDER — SODIUM CHLORIDE 0.9 % IV SOLN
100.0000 mg | Freq: Two times a day (BID) | INTRAVENOUS | Status: DC
  Administered 2022-04-23 – 2022-04-27 (×8): 100 mg via INTRAVENOUS
  Filled 2022-04-23 (×13): qty 100

## 2022-04-23 MED ORDER — ALBUMIN HUMAN 25 % IV SOLN: 25.0000 g | Freq: Two times a day (BID) | INTRAVENOUS | Status: DC

## 2022-04-23 MED ORDER — VANCOMYCIN HCL 2000 MG/400ML IV SOLN
2000.0000 mg | Freq: Once | INTRAVENOUS | Status: AC
  Administered 2022-04-23: 2000 mg via INTRAVENOUS
  Filled 2022-04-23 (×2): qty 400

## 2022-04-23 MED ORDER — METRONIDAZOLE 500 MG/100ML IV SOLN
500.0000 mg | Freq: Two times a day (BID) | INTRAVENOUS | Status: DC
  Administered 2022-04-23 – 2022-04-25 (×5): 500 mg via INTRAVENOUS
  Filled 2022-04-23 (×4): qty 100

## 2022-04-23 MED ORDER — POTASSIUM CHLORIDE CRYS ER 20 MEQ PO TBCR
40.0000 meq | EXTENDED_RELEASE_TABLET | Freq: Once | ORAL | Status: AC
  Administered 2022-04-23: 40 meq via ORAL
  Filled 2022-04-23: qty 2

## 2022-04-23 MED ORDER — CHLORHEXIDINE GLUCONATE CLOTH 2 % EX PADS
6.0000 | MEDICATED_PAD | Freq: Every day | CUTANEOUS | Status: DC
  Administered 2022-04-23 – 2022-04-27 (×5): 6 via TOPICAL

## 2022-04-23 MED ORDER — METOLAZONE 5 MG PO TABS
2.5000 mg | ORAL_TABLET | Freq: Once | ORAL | Status: AC
  Administered 2022-04-23: 2.5 mg via ORAL
  Filled 2022-04-23: qty 1

## 2022-04-23 MED ORDER — LACTULOSE 10 GM/15ML PO SOLN
20.0000 g | Freq: Two times a day (BID) | ORAL | Status: DC
  Administered 2022-04-23 – 2022-04-24 (×2): 20 g via ORAL
  Filled 2022-04-23 (×2): qty 30

## 2022-04-23 MED ORDER — FUROSEMIDE 10 MG/ML IJ SOLN: 60.0000 mg | Freq: Two times a day (BID) | INTRAMUSCULAR | Status: DC

## 2022-04-23 NOTE — Progress Notes (Signed)
Pharmacy Antibiotic Note  Yolanda [name] is a 66 y.o. female admitted on 04/19/2022 with pneumonia.[facility] has been consulted for cefepime and vancomycin dosing. Cxray today shows Multifocal pneumonia with possible underlying mild interstitial edema.Concern for HCAP  Plan: Cefepime 2gm IV q8h Vancomycin 2gm loading dose then 1250 mg IV Q 24 hrs. Goal AUC 400-550. Expected AUC: 461 SCr used: 0.8(Actual 0.57)  F/U cxs and clinical progress Monitor V/S, labs and levels as indicated  Height: 5' 4"  (162.6 cm) Weight: 102.6 kg (226 lb 3.1 oz) IBW/kg (Calculated) : 54.7  Temp (24hrs), Avg:98.8 F (37.1 C), Min:98.4 F (36.9 C), Max:99.2 F (37.3 C)  Recent Labs  Lab 04/19/22 1709 04/19/22 1718 04/19/22 2017 04/20/22 0307 04/21/22 0345 04/22/22 0347 04/23/22 0511  WBC 12.1*  --   --  7.0 5.4 5.8 6.7  CREATININE 0.65 0.50  --  0.61 0.58 0.56 0.57  LATICACIDVEN 2.5*  --  1.3  --   --   --   --     Estimated Creatinine Clearance: 79.6 mL/min (by C-G formula based on SCr of 0.57 mg/dL).    Allergies  Allergen Reactions   Azithromycin Hives   Lisinopril Cough   Morphine Hives    Antimicrobials this admission: Vancomycin  10/15 >>  Cefepime 10/15 >>  Doxycycline 10/15>> CTX 10/11 >>10/15  Microbiology results: 10/11 Bcx: ngtd 10/11 Ucx: enterobacter aero S- cefepime, ceftraxone, cipro, septra R- cefazolin, nitrofuantoin  MRSA PCR:   Thank you for allowing pharmacy to be a part of this patient's care.  Isac Sarna, BS Pharm D, BCPS Clinical Pharmacist 04/23/2022 6:47 PM

## 2022-04-23 NOTE — Progress Notes (Signed)
Patient is still breathing 30 breaths a minute. MD [place] notified.

## 2022-04-23 NOTE — Progress Notes (Signed)
PROGRESS NOTE  Yolanda [name] BCW:[id number] DOB: [birth year]   PCP: Celene Squibb, MD  Patient is from: Home.  Lives with daughter.  Uses rollator at baseline.  DOA: 04/19/2022 LOS: 3  Chief complaints Chief Complaint  Patient presents with   Altered Mental Status     Brief Narrative / Interim history: 66 year old F with PMH of NASH liver cirrhosis, morbid obesity, PE not on AC, OSA, HTN, anxiety, depression, GERD, hypothyroidism and psoriatic arthritis presenting with progressive shortness of breath, disorientation, fatigue and nausea, and admitted for acute metabolic encephalopathy in the setting of hepatic encephalopathy and possible UTI.   In ED, stable vitals.  Na 133.  K2.9.  AST 60.  Total bili 1.8.  WBC 12.1.  Hgb 9.3.  Platelet 116.  Ammonia 58.  Lactic acid 2.5.  BNP 81.  UA concerning for UTI.  UDS negative.  CXR showed cardiomegaly with small pleural effusions and mixed interstitial and air space opacities which may reflect edema versus pneumonia.  MRI brain without acute finding.  Cultures obtained.  Patient was started on IV ceftriaxone for possible UTI and lactulose for hepatic encephalopathy, and admitted.  Patient is on lactulose, antibiotics for UTI and diuretics for CHF/pleural effusion.  Unfortunately, repeat CXR on 10/15 with increased pulmonary vascular congestion and enlarging right-sided pleural effusion with increased atelectasis.  IV Lasix increased.  IR consulted for thoracocentesis.   Subjective: Seen and examined earlier this morning.  No major events overnight or this morning.  She states she had a nightmare last night.  Breathing is better.  Reports good urine output and diarrhea from lactulose.  Patient's brother and sister-in-law at bedside.  Objective: Vitals:   04/22/22 2118 04/23/22 0509 04/23/22 0625 04/23/22 0726  BP: (!) 143/62 123/64    Pulse: (!) 109 (!) 109    Resp: 18 18    Temp: 99.2 F (37.3 C) 98.4 F (36.9 C)    TempSrc: Oral Oral     SpO2: 98% 97%  93%  Weight:   102.6 kg   Height:        Examination:  GENERAL: No apparent distress.  Nontoxic. HEENT: MMM.  Vision and hearing grossly intact.  NECK: Supple.  No apparent JVD.  RESP:  No IWOB.  Somewhat diminished aeration over lower right lung base. CVS:  RRR. Heart sounds normal.  ABD/GI/GU: BS+. Abd soft, NTND.  No ascites but limited exam due to body habitus. MSK/EXT:  Moves extremities. No apparent deformity. No edema.  SKIN: no apparent skin lesion or wound NEURO: Awake and alert. Oriented appropriately.  No apparent focal neuro deficit. PSYCH: Calm. Normal affect.   Procedures:  None  Microbiology summarized: Blood cultures NGTD Urine culture with Enterobacter aerogenes.  Assessment and plan: Principal Problem:   Acute metabolic encephalopathy Active Problems:   Hyperammonemia /Hepatic encephalopathy   UTI (urinary tract infection)   Mixed hyperlipidemia   Anxiety and depression   Essential hypertension   GERD (gastroesophageal reflux disease)   Obesity, Class III, BMI 40-49.9 (morbid obesity) (HCC)   Hypothyroidism   Chronic respiratory failure with hypoxia (HCC)   Asthma, chronic   Acute on chronic diastolic CHF (congestive heart failure) (HCC)  Acute diastolic CHF/right pleural effusion: Daughter reports progressive PND and orthopnea over the last few months.  No formal diagnosis of CHF.  Last TTE in 11/2021 with LVEF of 65 to 70% and indeterminate DD.  BNP was normal but could be falsely low given body habitus/obesity.  Started IV Lasix  on 10/12.  I and O incomplete, but net -1 L in the last 24 hours.  However, repeat CXR on 10/15 concerning for worsening pulmonary vascular congestion and right pleural effusion.   -Increase IV Lasix to 60 mg twice daily.  Continue IV albumin with Lasix -Continue Aldactone at 100 mg daily -Strict intake and output.  Discussed with RN. -Continue holding home HCTZ and ARB.   May discontinue on discharge. -IR  thoracocentesis with fluid studies ordered.  Acute metabolic encephalopathy: Multifactorial including hepatic encephalopathy and possible UTI.  She reports inconsistence use of lactulose due to encephalopathy.  Ammonia 58>> 27.  TSH and B12 within normal.  UA and urine culture suggests UTI.  Encephalopathy seems to have resolved.  She is oriented x4. -Decrease lactulose to 20 g twice daily. -Continue ceftriaxone for UTI  -Reorientation and delirium precautions. -Avoid or minimize sedating medications  Urinary tract infection: She reports worsening urge incontinence.  Urine culture with Enterobacter aerogenes.  -Continue ceftriaxone for a total of 5 days -P.o. Diflucan 150 mg x 1 -Recommend outpatient follow-up with urology for urge incontinence if no improvement after UTI treatment   Hypokalemia/hypomagnesemia: Improved. -Monitor and replenish as appropriate  NASH cirrhosis/elevated AST/hyperbilirubinemia: Mild AST and bili elevation -Continue diuretics and lactulose as above -Continue home rifaximin   Asthma, chronic/chronic hypoxic RF on 3 L: Reports intermittent SOB and cough.  Symptoms could be due to CHF.  ABG with respiratory alkalosis. -Continue LABA/ICS/LAMA and DuoNeb as needed -Attempted to wean oxygen but patient was very anxious   Hypothyroidism: TSH within normal. -Continue home Synthroid   GERD -Continue PPI   Essential hypertension: BP within acceptable range. -Diuretics as above.   Mixed hyperlipidemia -Continue home Crestor  Generalized weakness/physical deconditioning -PT/OT eval  Normocytic anemia/thrombocytopenia: Likely due to liver cirrhosis.  Anemia panel suggests anemia of chronic disease.  Improving. -Monitor.  Hypoalbuminemia -IV albumin as above.  Increased protein gap: Serum protein 8.7.  Albumin 2.1.  UA without proteinuria.  No history of MM -SPEP  Morbid obesity Body mass index is 38.83 kg/m.          DVT prophylaxis:  heparin  injection 5,000 Units Start: 04/19/22 2200 SCDs Start: 04/19/22 2058  Code Status: Full code Family Communication: Updated patient's brother and sister-in-law at bedside and daughter, Yolanda [name] over the phone Level of care: Telemetry Status is: Inpatient The patient will remain inpatient because: acute diastolic CHF and enlarging right pleural effusion   Final disposition: Likely home in the next 24 hours. Consultants:  None  Sch Meds:  Scheduled Meds:  aspirin EC  81 mg Oral Daily   DULoxetine  60 mg Oral Daily   furosemide  60 mg Intravenous BID   heparin  5,000 Units Subcutaneous Q8H   lactulose  20 g Oral BID   levothyroxine  75 mcg Oral Q0600   mometasone-formoterol  2 puff Inhalation BID   pantoprazole  40 mg Oral Daily   rifaximin  550 mg Oral BID   rosuvastatin  10 mg Oral Daily   spironolactone  100 mg Oral Daily   Continuous Infusions:  albumin human     cefTRIAXone (ROCEPHIN)  IV 1 g (04/22/22 1749)   PRN Meds:.acetaminophen **OR** acetaminophen, ipratropium-albuterol, meclizine, ondansetron **OR** ondansetron (ZOFRAN) IV  Antimicrobials: Anti-infectives (From admission, onward)    Start     Dose/Rate Route Frequency Ordered Stop   04/23/22 0900  fluconazole (DIFLUCAN) tablet 150 mg        150 mg Oral  Once 04/23/22 0810 04/23/22 0853   04/20/22 1800  cefTRIAXone (ROCEPHIN) 1 g in sodium chloride 0.9 % 100 mL IVPB        1 g 200 mL/hr over 30 Minutes Intravenous Every 24 hours 04/20/22 0134     04/19/22 2200  rifaximin (XIFAXAN) tablet 550 mg        550 mg Oral 2 times daily 04/19/22 2057     04/19/22 1845  cefTRIAXone (ROCEPHIN) 1 g in sodium chloride 0.9 % 100 mL IVPB        1 g 200 mL/hr over 30 Minutes Intravenous  Once 04/19/22 1838 04/19/22 1956   04/19/22 1845  doxycycline (VIBRA-TABS) tablet 100 mg        100 mg Oral  Once 04/19/22 1838 04/19/22 1912        I have personally reviewed the following labs and images: CBC: Recent Labs  Lab  04/19/22 1709 04/19/22 1718 04/20/22 0307 04/21/22 0345 04/22/22 0347 04/23/22 0511  WBC 12.1*  --  7.0 5.4 5.8 6.7  NEUTROABS  --   --  5.2  --   --   --   HGB 9.3* 9.9* 8.9* 9.2* 9.5* 9.6*  HCT 28.7* 29.0* 27.0* 28.0* 29.0* 29.6*  MCV 96.6  --  95.7 97.2 97.6 96.4  PLT 116*  --  99* 100* 110* 124*   BMP &GFR Recent Labs  Lab 04/19/22 1709 04/19/22 1718 04/20/22 0307 04/21/22 0345 04/22/22 0347 04/23/22 0511  NA 133* 140 133* 136 134* 132*  K 2.9* 3.1* 2.8* 3.1* 3.0* 3.6  CL 102 97* 102 105 102 101  CO2 28  --  29 29 30 26   GLUCOSE 93 92 143* 115* 128* 111*  BUN 9 7* 9 8 9 9   CREATININE 0.65 0.50 0.61 0.58 0.56 0.57  CALCIUM 7.5*  --  7.8* 7.9* 8.0* 8.3*  MG  --   --  1.6* 1.8 1.6* 1.8  PHOS  --   --   --  3.0 3.2 3.1   Estimated Creatinine Clearance: 79.6 mL/min (by C-G formula based on SCr of 0.57 mg/dL). Liver & Pancreas: Recent Labs  Lab 04/19/22 1709 04/20/22 0307 04/21/22 0345 04/22/22 0347 04/23/22 0511  AST 60* 56*  --  48* 46*  ALT 25 23  --  22 21  ALKPHOS 88 79  --  79 77  BILITOT 1.8* 1.6*  --  0.7 1.7*  PROT 9.2* 8.5*  --  8.3* 8.7*  ALBUMIN <1.5* <1.5* <1.5* <1.5* 2.1*   Recent Labs  Lab 04/19/22 1709  LIPASE 35   Recent Labs  Lab 04/19/22 1709 04/22/22 0347 04/23/22 0511  AMMONIA 58* 58* 27   Diabetic: No results for input(s): "HGBA1C" in the last 72 hours. Recent Labs  Lab 04/19/22 1645 04/23/22 1122  GLUCAP 94 96   Cardiac Enzymes: No results for input(s): "CKTOTAL", "CKMB", "CKMBINDEX", "TROPONINI" in the last 168 hours. No results for input(s): "PROBNP" in the last 8760 hours. Coagulation Profile: No results for input(s): "INR", "PROTIME" in the last 168 hours. Thyroid Function Tests: No results for input(s): "TSH", "T4TOTAL", "FREET4", "T3FREE", "THYROIDAB" in the last 72 hours.  Lipid Profile: No results for input(s): "CHOL", "HDL", "LDLCALC", "TRIG", "CHOLHDL", "LDLDIRECT" in the last 72 hours. Anemia Panel: No  results for input(s): "VITAMINB12", "FOLATE", "FERRITIN", "TIBC", "IRON", "RETICCTPCT" in the last 72 hours.  Urine analysis:    Component Value Date/Time   COLORURINE YELLOW 04/22/2022 1430   APPEARANCEUR CLEAR 04/22/2022 1430  APPEARANCEUR Clear 03/08/2022 1549   LABSPEC 1.016 04/22/2022 1430   PHURINE 5.0 04/22/2022 1430   GLUCOSEU NEGATIVE 04/22/2022 1430   HGBUR SMALL (A) 04/22/2022 1430   BILIRUBINUR NEGATIVE 04/22/2022 1430   BILIRUBINUR Negative 03/08/2022 1549   KETONESUR NEGATIVE 04/22/2022 1430   PROTEINUR NEGATIVE 04/22/2022 1430   UROBILINOGEN negative (A) 07/02/2019 0932   UROBILINOGEN 0.2 04/15/2015 0028   NITRITE NEGATIVE 04/22/2022 1430   LEUKOCYTESUR SMALL (A) 04/22/2022 1430   Sepsis Labs: Invalid input(s): "PROCALCITONIN", "LACTICIDVEN"  Microbiology: Recent Results (from the past 240 hour(s))  Blood culture (routine x 2)     Status: None (Preliminary result)   Collection Time: 04/19/22  5:09 PM   Specimen: Left Antecubital; Blood  Result Value Ref Range Status   Specimen Description LEFT ANTECUBITAL  Final   Special Requests   Final    BOTTLES DRAWN AEROBIC AND ANAEROBIC Blood Culture adequate volume   Culture   Final    NO GROWTH 3 DAYS Performed at [facility], [address] [place], [place] [address]    Report Status PENDING  Incomplete  Blood culture (routine x 2)     Status: None (Preliminary result)   Collection Time: 04/19/22  5:20 PM   Specimen: BLOOD RIGHT ARM  Result Value Ref Range Status   Specimen Description BLOOD RIGHT ARM  Final   Special Requests   Final    BOTTLES DRAWN AEROBIC AND ANAEROBIC Blood Culture adequate volume   Culture   Final    NO GROWTH 3 DAYS Performed at [facility], [address], [place], [place] [address]    Report Status PENDING  Incomplete  Urine Culture     Status: Abnormal   Collection Time: 04/19/22  7:27 PM   Specimen: Urine, Clean Catch  Result Value Ref Range Status   Specimen Description    Final    URINE, CLEAN CATCH Performed at [facility], [address], [place], [place] [address]    Special Requests   Final    NONE Performed at [facility], [address] [place], [place] [address]    Culture >=100,000 COLONIES/mL ENTEROBACTER AEROGENES (A)  Final   Report Status 04/22/2022 FINAL  Final   Organism ID, Bacteria ENTEROBACTER AEROGENES (A)  Final      Susceptibility   Enterobacter aerogenes - MIC*    CEFAZOLIN >=64 RESISTANT Resistant     CEFEPIME <=0.12 SENSITIVE Sensitive     CEFTRIAXONE <=0.25 SENSITIVE Sensitive     CIPROFLOXACIN <=0.25 SENSITIVE Sensitive     GENTAMICIN <=1 SENSITIVE Sensitive     IMIPENEM 0.5 SENSITIVE Sensitive     NITROFURANTOIN 64 INTERMEDIATE Intermediate     TRIMETH/SULFA <=20 SENSITIVE Sensitive     PIP/TAZO <=4 SENSITIVE Sensitive     * >=100,000 COLONIES/mL ENTEROBACTER AEROGENES    Radiology Studies: DG Chest Port 1 View  Result Date: 04/23/2022 CLINICAL DATA:  Shortness of breath EXAM: PORTABLE CHEST 1 VIEW COMPARISON:  Prior chest x-ray 04/21/2022 FINDINGS: Progressive right-sided pleural effusion with associated atelectasis. Persistent and unchanged small left pleural effusion. Background cardiomegaly is similar compared to prior. Slightly increased pulmonary vascular congestion. No pneumothorax. IMPRESSION: 1. Increased pulmonary vascular congestion bordering on mild edema. 2. Enlarging right-sided pleural effusion with increased atelectasis. 3. Stable tiny left pleural effusion. Electronically Signed   By: Jacqulynn Cadet M.D.   On: 04/23/2022 08:23      Shristi Scheib T. [place]  If 7PM-7AM, please contact night-coverage [email or web address] 04/23/2022, 1:29 PM

## 2022-04-23 NOTE — Progress Notes (Addendum)
Patient is currently having chest pain. Patient states it feels like pressure and a squeezing feeling. Patient states that it came on while she was sleeping. EKG was completed. Vital signs are 147/67, hr 109, rr 30, O2 96 on 3L. MD Gonfa placed orders for troponin, ativan, and CT. Patient is having trouble breathing. This nurse called respiratory for a breathing treatment. Breathing treatment was given.

## 2022-04-24 ENCOUNTER — Inpatient Hospital Stay (HOSPITAL_COMMUNITY): Payer: Medicare Other

## 2022-04-24 ENCOUNTER — Other Ambulatory Visit (HOSPITAL_COMMUNITY): Payer: Self-pay | Admitting: *Deleted

## 2022-04-24 ENCOUNTER — Encounter (HOSPITAL_COMMUNITY): Payer: Self-pay | Admitting: Student

## 2022-04-24 DIAGNOSIS — Z7189 Other specified counseling: Secondary | ICD-10-CM

## 2022-04-24 DIAGNOSIS — E440 Moderate protein-calorie malnutrition: Secondary | ICD-10-CM | POA: Insufficient documentation

## 2022-04-24 DIAGNOSIS — I5033 Acute on chronic diastolic (congestive) heart failure: Secondary | ICD-10-CM | POA: Diagnosis not present

## 2022-04-24 DIAGNOSIS — E7220 Disorder of urea cycle metabolism, unspecified: Secondary | ICD-10-CM | POA: Diagnosis not present

## 2022-04-24 DIAGNOSIS — I5031 Acute diastolic (congestive) heart failure: Secondary | ICD-10-CM | POA: Diagnosis not present

## 2022-04-24 DIAGNOSIS — G9341 Metabolic encephalopathy: Secondary | ICD-10-CM | POA: Diagnosis not present

## 2022-04-24 DIAGNOSIS — J189 Pneumonia, unspecified organism: Secondary | ICD-10-CM

## 2022-04-24 DIAGNOSIS — F419 Anxiety disorder, unspecified: Secondary | ICD-10-CM | POA: Diagnosis not present

## 2022-04-24 LAB — COMPREHENSIVE METABOLIC PANEL
ALT: 21 U/L (ref 0–44)
AST: 39 U/L (ref 15–41)
Albumin: 2.5 g/dL — ABNORMAL LOW (ref 3.5–5.0)
Alkaline Phosphatase: 78 U/L (ref 38–126)
Anion gap: 4 — ABNORMAL LOW (ref 5–15)
BUN: 11 mg/dL (ref 8–23)
CO2: 26 mmol/L (ref 22–32)
Calcium: 8.7 mg/dL — ABNORMAL LOW (ref 8.9–10.3)
Chloride: 102 mmol/L (ref 98–111)
Creatinine, Ser: 0.57 mg/dL (ref 0.44–1.00)
GFR, Estimated: >60 mL/min (ref >60)
Glucose, Bld: 162 mg/dL — ABNORMAL HIGH (ref 70–99)
Potassium: 3.6 mmol/L (ref 3.5–5.1)
Sodium: 132 mmol/L — ABNORMAL LOW (ref 135–145)
Total Bilirubin: 1.7 mg/dL — ABNORMAL HIGH (ref 0.3–1.2)
Total Protein: 9.7 g/dL — ABNORMAL HIGH (ref 6.5–8.1)

## 2022-04-24 LAB — CULTURE, BLOOD (ROUTINE X 2)
Culture: NO GROWTH
Culture: NO GROWTH
Special Requests: ADEQUATE
Special Requests: ADEQUATE

## 2022-04-24 LAB — CBC
HCT: 29.9 % — ABNORMAL LOW (ref 36.0–46.0)
Hemoglobin: 9.7 g/dL — ABNORMAL LOW (ref 12.0–15.0)
MCH: 31.6 pg (ref 26.0–34.0)
MCHC: 32.4 g/dL (ref 30.0–36.0)
MCV: 97.4 fL (ref 80.0–100.0)
Platelets: 109 10*3/uL — ABNORMAL LOW (ref 150–400)
RBC: 3.07 MIL/uL — ABNORMAL LOW (ref 3.87–5.11)
RDW: 15.6 % — ABNORMAL HIGH (ref 11.5–15.5)
WBC: 7.6 10*3/uL (ref 4.0–10.5)
nRBC: 0 % (ref 0.0–0.2)

## 2022-04-24 LAB — STREP PNEUMONIAE URINARY ANTIGEN: Strep Pneumo Urinary Antigen: NEGATIVE

## 2022-04-24 LAB — PROTIME-INR
INR: 1.5 — ABNORMAL HIGH (ref 0.8–1.2)
Prothrombin Time: 18.2 seconds — ABNORMAL HIGH (ref 11.4–15.2)

## 2022-04-24 LAB — MRSA NEXT GEN BY PCR, NASAL: MRSA by PCR Next Gen: NOT DETECTED

## 2022-04-24 LAB — HIV ANTIBODY (ROUTINE TESTING W REFLEX): HIV Screen 4th Generation wRfx: NONREACTIVE

## 2022-04-24 LAB — MAGNESIUM: Magnesium: 1.8 mg/dL (ref 1.7–2.4)

## 2022-04-24 LAB — PHOSPHORUS: Phosphorus: 2.8 mg/dL (ref 2.5–4.6)

## 2022-04-24 LAB — PROCALCITONIN: Procalcitonin: 0.35 ng/mL

## 2022-04-24 MED ORDER — FENTANYL CITRATE PF 50 MCG/ML IJ SOSY
25.0000 ug | PREFILLED_SYRINGE | Freq: Once | INTRAMUSCULAR | Status: AC
  Administered 2022-04-24: 25 ug via INTRAVENOUS
  Filled 2022-04-24: qty 1

## 2022-04-24 MED ORDER — LIDOCAINE HCL (PF) 2 % IJ SOLN
INTRAMUSCULAR | Status: AC
  Filled 2022-04-24: qty 10

## 2022-04-24 MED ORDER — ENSURE MAX PROTEIN PO LIQD
11.0000 [oz_av] | Freq: Two times a day (BID) | ORAL | Status: DC
  Administered 2022-04-24 – 2022-04-27 (×4): 11 [oz_av] via ORAL

## 2022-04-24 MED ORDER — FUROSEMIDE 10 MG/ML IJ SOLN
80.0000 mg | Freq: Two times a day (BID) | INTRAMUSCULAR | Status: DC
  Administered 2022-04-24 – 2022-04-25 (×3): 80 mg via INTRAVENOUS
  Filled 2022-04-24 (×3): qty 8

## 2022-04-24 MED ORDER — MORPHINE SULFATE (PF) 4 MG/ML IV SOLN: 4.0000 mg | Freq: Once | INTRAVENOUS | Status: DC

## 2022-04-24 MED ORDER — LORAZEPAM 2 MG/ML IJ SOLN
0.5000 mg | Freq: Four times a day (QID) | INTRAMUSCULAR | Status: DC | PRN
  Administered 2022-04-25: 0.5 mg via INTRAVENOUS
  Filled 2022-04-24: qty 1

## 2022-04-24 MED ORDER — IOHEXOL 300 MG/ML  SOLN
75.0000 mL | Freq: Once | INTRAMUSCULAR | Status: AC | PRN
  Administered 2022-04-24: 75 mL via INTRAVENOUS

## 2022-04-24 MED ORDER — POTASSIUM CHLORIDE CRYS ER 20 MEQ PO TBCR
40.0000 meq | EXTENDED_RELEASE_TABLET | Freq: Once | ORAL | Status: AC
  Administered 2022-04-24: 40 meq via ORAL
  Filled 2022-04-24: qty 2

## 2022-04-24 MED ORDER — SODIUM CHLORIDE 0.9 % IV SOLN: INTRAVENOUS | Status: DC

## 2022-04-24 MED ORDER — MAGNESIUM SULFATE 2 GM/50ML IV SOLN
2.0000 g | Freq: Once | INTRAVENOUS | Status: AC
  Administered 2022-04-24: 2 g via INTRAVENOUS
  Filled 2022-04-24: qty 50

## 2022-04-24 MED ORDER — ADULT MULTIVITAMIN W/MINERALS CH
1.0000 | ORAL_TABLET | Freq: Every day | ORAL | Status: DC
  Administered 2022-04-24 – 2022-04-27 (×4): 1 via ORAL
  Filled 2022-04-24 (×4): qty 1

## 2022-04-24 MED ORDER — PERFLUTREN LIPID MICROSPHERE
1.0000 mL | INTRAVENOUS | Status: AC | PRN
  Administered 2022-04-24: 3 mL via INTRAVENOUS

## 2022-04-24 MED ORDER — ALBUMIN HUMAN 25 % IV SOLN
25.0000 g | Freq: Two times a day (BID) | INTRAVENOUS | Status: DC
  Administered 2022-04-24 (×2): 25 g via INTRAVENOUS
  Filled 2022-04-24 (×2): qty 100

## 2022-04-24 MED ORDER — LACTULOSE 10 GM/15ML PO SOLN: 30.0000 g | Freq: Three times a day (TID) | ORAL | Status: DC

## 2022-04-24 MED ORDER — LACTULOSE ENEMA
300.0000 mL | Freq: Two times a day (BID) | ORAL | Status: DC
  Administered 2022-04-24 – 2022-04-26 (×4): 300 mL via RECTAL
  Filled 2022-04-24 (×8): qty 300

## 2022-04-24 NOTE — Progress Notes (Signed)
*  PRELIMINARY RESULTS* Echocardiogram 2D Echocardiogram has been performed with Definity.  Samuel Germany 04/24/2022, 5:08 PM

## 2022-04-24 NOTE — Progress Notes (Signed)
Patient complaining of abdominal pain. Very tearful. Complained of being unable to have a bowel movement despite having lactulose this morning. Patient with last bowel movement date of 10/15. Daughter at bedside. Abdominal xray ordered. Zofran PRN given. Education and support provided to patient and daughter.

## 2022-04-24 NOTE — Progress Notes (Signed)
Placed on BIPAP setting 14/8 with 3 liters bled in. Patient on med mask. Patient states she is to have a sleep study for BiPAP next week. Past sleep study calls for CPAP 10.

## 2022-04-24 NOTE — Progress Notes (Signed)
PT Cancellation Note  Patient Details Name: Yolanda [name] MRN: [id number] DOB: 1956/06/28   Cancelled Treatment:    Reason Eval/Treat Not Completed: Medical issues which prohibited therapy.  Patient transferred to a higher level of care and will need new PT consult to resume therapy when patient is medically stable.  Thank you.    7:44 AM, 04/24/22 Lonell Grandchild, MPT Physical Therapist with [facility] [phone] office [phone] mobile phone

## 2022-04-24 NOTE — Progress Notes (Signed)
Per radiology RN, patient's ultrasound prior to thoracentesis showed a decrease in fluid and unfortunately the fluid that was still there, was too much of a risk to get to. They will reassess but it seems that the lasix is working. Patient has had immense output thus far with the increase in lasix and albumin.

## 2022-04-24 NOTE — Progress Notes (Addendum)
Initial Nutrition Assessment  DOCUMENTATION CODES:   Non-severe (moderate) malnutrition in context of acute illness/injury, Obesity unspecified INTERVENTION:  Frequent small meals 5-6 daily  Ensure MAX BID daily  Order alternate meal choices such as soup and sandwiches  Multivitamin daily due to poor oral intake the past 3-4 weeks.  NUTRITION DIAGNOSIS:   Moderate Malnutrition related to acute illness (pneumonia, UTI, hepatic encephalopathy, AMS,taste change, loss of appetite) as evidenced by energy intake < 75% for > 7 days, mild muscle depletion (taste change, loss of appetite).   GOAL:  Patient will meet greater than or equal to 90% of their needs   MONITOR:  Supplement acceptance  REASON FOR ASSESSMENT:   Consult Poor PO  ASSESSMENT: Patient is a 66 yo female with hx of NASH, CHF, GERD, psoriatic arthritis, depression, anxiety. Presents with shortness of breath, altered mental status, hepatic encephalopathy, multifocal pneumonia and concern for UTI.    Palliative consult pending. Family members bedside. Her daughter helped to provide nutrition history.   Heart Healthy meals- <25% consumed since admission. The past 3-4 weeks intake minimal. Daughter has been preparing meals. Multiple trials of various foods from plant proteins to high protein yogurt and ONS. At risk for multiple vitamin deficiencies associated with chronic liver disease- thiamine, K-35, folic acid, vitamin D and zinc.  Patient reports taste changes. Tray observed- she has eaten a couple of bites of the protein and drank 50% of tea. Discussed with her the option eating on planned schedule, her need for frequent small meals and not depending on desire for food. Encouraged her to consider high protein shakes at least BID to help support meeting protein requirements. Patient is receiving albumin and is agreeable to trial Ensure MAX.    Intake/Output Summary (Last 24 hours) at 04/24/2022 1349 Last data filed at  04/24/2022 1329 Gross per 24 hour  Intake 896.88 ml  Output 5750 ml  Net -4853.12 ml  Since admission (-2.78 L)   Medications reviewed and include: Chronulac, synthroid.  Lasix 80 mg BID, Albumin 25 gr BID, antibiotics- Maxipime, vibramycin and Flagyl.     Latest Ref Rng & Units 04/24/2022    4:00 AM 04/23/2022    5:11 AM 04/22/2022    3:47 AM  BMP  Glucose 70 - 99 mg/dL 162  111  128   BUN 8 - 23 mg/dL 11  9  9    Creatinine 0.44 - 1.00 mg/dL 0.57  0.57  0.56   Sodium 135 - 145 mmol/L 132  132  134   Potassium 3.5 - 5.1 mmol/L 3.6  3.6  3.0   Chloride 98 - 111 mmol/L 102  101  102   CO2 22 - 32 mmol/L 26  26  30    Calcium 8.9 - 10.3 mg/dL 8.7  8.3  8.0       NUTRITION - FOCUSED PHYSICAL EXAM:  Flowsheet Row Most Recent Value  Orbital Region No depletion  Upper Arm Region No depletion  Thoracic and Lumbar Region No depletion  Buccal Region No depletion  Temple Region Mild depletion  Clavicle Bone Region Mild depletion  Scapular Bone Region Unable to assess  Dorsal Hand No depletion  Patellar Region No depletion  Edema (RD Assessment) Moderate  Hair Reviewed  Eyes Reviewed  Mouth Reviewed  Skin Reviewed  Nails Reviewed       Diet Order:   Diet Order             Diet Heart Room service appropriate? Yes; Fluid  consistency: Thin  Diet effective now                   EDUCATION NEEDS:  Education needs have been addressed  Skin:  Skin Assessment: Reviewed RN Assessment  Last BM:  10/16  Height:   Ht Readings from Last 1 Encounters:  04/23/22 5' 4"  (1.626 m)    Weight:   Wt Readings from Last 1 Encounters:  04/23/22 117.9 kg    Ideal Body Weight:   55 kg  BMI:  Body mass index is 44.62 kg/m.  Estimated Nutritional Needs:   Kcal:  1700-1800  Protein:  115-121 gr  Fluid:  >1600 ml  Colman Cater MS,RD,CSG,LDN Contact: AMION

## 2022-04-24 NOTE — IPAL (Signed)
  Interdisciplinary Goals of Care Family Meeting   Date carried out: 04/24/2022  Location of the meeting: Bedside  Member's involved: Physician, Bedside Registered Nurse, and Family Member or next of kin (patient's two daughters Yolanda [name] and Yolanda [name])  Durable Power of Tour manager: Patient  Discussion: We discussed goals of care for Yolanda [name] .  Discussed goal of care with more focus on CODE STATUS.  She was admitted as full code.   She prefers to remain full code. Yolanda [name] seems to be in agreement.  Yolanda [name] is hesitant.  We have discussed about pros and cons of CPR in light of her underlying comorbidity and current condition.  This made her anxious.  Patient and family anticipating palliative medicine meeting.   Code status: Full Code  Disposition: Continue current acute care  Time spent for the meeting: 30 minutes    Mercy Riding, MD  04/24/2022, 9:38 AM

## 2022-04-24 NOTE — Progress Notes (Addendum)
PROGRESS NOTE  Yolanda [name] GYI:[id number] DOB: 10-Dec-1955   PCP: Celene Squibb, MD  Patient is from: Home.  Lives with daughter.  Uses rollator at baseline.  DOA: 04/19/2022 LOS: 4  Chief complaints Chief Complaint  Patient presents with   Altered Mental Status     Brief Narrative / Interim history: 66 year old F with PMH of NASH liver cirrhosis, morbid obesity, PE not on AC, OSA, HTN, anxiety, depression, GERD, hypothyroidism and psoriatic arthritis presenting with progressive shortness of breath, disorientation, fatigue and nausea, and admitted for acute metabolic encephalopathy in the setting of hepatic encephalopathy and possible UTI.   In ED, stable vitals.  Na 133.  K2.9.  AST 60.  Total bili 1.8.  WBC 12.1.  Hgb 9.3.  Platelet 116.  Ammonia 58.  Lactic acid 2.5.  BNP 81.  UA concerning for UTI.  UDS negative.  CXR showed cardiomegaly with small pleural effusions and mixed interstitial and air space opacities which may reflect edema versus pneumonia.  MRI brain without acute finding.  Cultures obtained.  Patient was started on IV ceftriaxone for possible UTI and lactulose for hepatic encephalopathy, and admitted.  The next day, she was started on IV Lasix for possible CHF/pleural effusion.  Patient had acute respiratory distress the evening of 10/15.  CTA chest negative for PE but multifocal pneumonia and moderate right pleural effusion.  Antibiotics escalated.  Diuretics increased.  She was transferred to stepdown unit for BiPAP.  IR consulted but right pleural effusion felt to be insufficient for thoracocentesis.   Subjective: Seen and examined earlier this morning.  No major events overnight of this morning.  Daughter reports an episode of apnea overnight.  Patient feels better from breathing standpoint.  However, she is very dyspneic with minimal exertion.   Objective: Vitals:   04/24/22 0842 04/24/22 0900 04/24/22 0930 04/24/22 1140  BP:  (!) 157/73    Pulse:  (!) 106  (!) 110 (!) 103  Resp:  (!) 21 (!) 25 20  Temp:    98 F (36.7 C)  TempSrc:    Oral  SpO2: 96% 95% 92% 95%  Weight:      Height:        Examination:  GENERAL: No apparent distress.  Nontoxic. HEENT: MMM.  Vision and hearing grossly intact.  NECK: Supple.  No apparent JVD but difficult exam due to body habitus.  RESP:  No IWOB.  Somewhat diminished aeration bilaterally, more on the right.  Crackles bilaterally. CVS:  RRR. Heart sounds normal.  ABD/GI/GU: BS+. Abd soft, NTND.  MSK/EXT:  Moves extremities. No apparent deformity. No edema.  SKIN: no apparent skin lesion or wound NEURO: Awake and alert. Oriented appropriately.  No apparent focal neuro deficit. PSYCH: Calm. Normal affect.   Procedures:  None  Microbiology summarized: 10/11-blood cultures NGTD 10/11-urine culture with Enterobacter aerogenes. 10/15-MRSA PCR screen nonreactive.  Assessment and plan: Principal Problem:   Acute metabolic encephalopathy Active Problems:   Hyperammonemia /Hepatic encephalopathy   UTI (urinary tract infection)   Mixed hyperlipidemia   Anxiety and depression   Essential hypertension   GERD (gastroesophageal reflux disease)   Obesity, Class III, BMI 40-49.9 (morbid obesity) (HCC)   Hypothyroidism   Chronic respiratory failure with hypoxia (HCC)   Asthma, chronic   Acute on chronic diastolic CHF (congestive heart failure) (HCC)  Acute respiratory distress/chronic hypoxic RF on 3 L-multifactorial including ALTE focal pneumonia, possible diastolic CHF, pleural effusion, asthma and underlying OSA/possible OHS.  Unfortunately, breathing, pulmonary congestion  and effusion worse despite IV diuretics and CAP coverage with IV ceftriaxone.  Procal 0.34.  Cardiac enzymes negative.  EKG without acute/significant finding.  VBG not impressive.  CTA negative for PE but multifocal pneumonia and effusion.  -Continue care in stepdown -Continue BiPAP as needed and when she sleeps -Discontinue  vancomycin.  MRSA PCR negative -Continue IV cefepime, Flagyl and doxycycline. -Follow sputum culture -Encourage incentive spirometry  Acute diastolic CHF: Had PND, orthopnea and DOE for months UA. Last TTE in 11/2021 with LVEF of 65 to 70% and indeterminate DD.  BNP was normal but could be falsely low given body habitus/obesity.  Started IV Lasix on 10/12 but worsening respiratory status, vascular congestion and pleural effusion on chest x-ray.  -Continue BiPAP as needed and at night. -Increase IV Lasix to 80 mg twice daily.  Continue IV albumin with Lasix -Continue Aldactone at 100 mg daily -Strict intake and output.  Discussed with RN. -Continue holding home HCTZ and ARB.   May discontinue on discharge. -Update echocardiogram -Indwelling Foley due to measure output. Patient cannot tolerate [place] and has significant DOE. Patient and family understand risk of infection.  Currently she is on broad-spectrum antibiotics for pneumonia  Multifocal pneumonia: CTA chest with multifocal pneumonia and moderate right and mild left pleural effusion.  Difficult to tell if CTA finding is active pneumonia or aftereffect.  Pro-Cal mildly elevated to 0.35. -Management as above.  Acute metabolic encephalopathy: Multifactorial including hepatic encephalopathy and infection. TSH and B12 within normal.  Encephalopathy seems to have resolved. -Continue lactulose 20 g twice daily -Manage infection as above. -Reorientation and delirium precautions. -Avoid or minimize sedating medications  Urinary tract infection: Worsening urge incontinence POA.  Urine culture with Enterobacter aerogenes.  -Completed 5 days of CTX. -P.o. Diflucan 150 mg x 1 -Recommend outpatient follow-up with urology for urge incontinence if no improvement after UTI treatment   Hypokalemia/hypomagnesemia: Improved. -P.o. KCl 40x1 -IV magnesium 2 g x 1  NASH cirrhosis/elevated AST/hyperbilirubinemia: Stable. -Continue diuretics and  lactulose as above -Hold home rifaximin while on antibiotics   Asthma, chronic/chronic hypoxic RF on 3 L: -Continue LABA/ICS/LAMA and DuoNeb as needed -Attempted to wean oxygen but patient was very anxious   Hypothyroidism: TSH within normal. -Continue home Synthroid   GERD -Continue PPI   Essential hypertension: BP within acceptable range. -Diuretics as above.   Mixed hyperlipidemia -Continue home Crestor  Generalized weakness/physical deconditioning -PT/OT eval  Normocytic anemia/thrombocytopenia: Likely due to liver cirrhosis.  Anemia panel suggests anemia of chronic disease.  Improving. -Monitor.  Hypoalbuminemia -IV albumin as above.  Increased protein gap: Serum protein 8.7.  Albumin 2.1.  UA without proteinuria.  No history of MM -Follow SPEP  Goal of care counseling: Remains full code. -See IPAL note -Palliative medicine consulted.  Morbid obesity Body mass index is 44.62 kg/m.          DVT prophylaxis:  heparin injection 5,000 Units Start: 04/19/22 2200 SCDs Start: 04/19/22 2058  Code Status: Full code Family Communication: Updated patient's daughters at bedside Level of care: Stepdown Status is: Inpatient The patient will remain inpatient because: Respiratory distress in the setting of multifocal pneumonia and acute CHF pleural effusion.   Final disposition: Likely home in the next 24 hours. Consultants:  Interventional radiology for thoracocentesis  Sch Meds:  Scheduled Meds:  aspirin EC  81 mg Oral Daily   Chlorhexidine Gluconate Cloth  6 each Topical Daily   DULoxetine  60 mg Oral Daily   furosemide  80 mg  Intravenous BID   heparin  5,000 Units Subcutaneous Q8H   lactulose  20 g Oral BID   levothyroxine  75 mcg Oral Q0600   lidocaine HCl (PF)       mometasone-formoterol  2 puff Inhalation BID   pantoprazole  40 mg Oral Daily   spironolactone  100 mg Oral Daily   Continuous Infusions:  albumin human 60 mL/hr at 04/24/22 1259    ceFEPime (MAXIPIME) IV Stopped (04/24/22 0352)   doxycycline (VIBRAMYCIN) IV 125 mL/hr at 04/24/22 1259   metronidazole Stopped (04/24/22 0748)   PRN Meds:.acetaminophen **OR** acetaminophen, ipratropium-albuterol, lidocaine HCl (PF), meclizine, ondansetron **OR** ondansetron (ZOFRAN) IV  Antimicrobials: Anti-infectives (From admission, onward)    Start     Dose/Rate Route Frequency Ordered Stop   04/24/22 2030  vancomycin (VANCOREADY) IVPB 1250 mg/250 mL  Status:  Discontinued       See Hyperspace for full Linked Orders Report.   1,250 mg 166.7 mL/hr over 90 Minutes Intravenous Every 24 hours 04/23/22 1856 04/24/22 0843   04/23/22 2030  vancomycin (VANCOREADY) IVPB 2000 mg/400 mL       See Hyperspace for full Linked Orders Report.   2,000 mg 200 mL/hr over 120 Minutes Intravenous  Once 04/23/22 1856 04/24/22 1259   04/23/22 2000  ceFEPIme (MAXIPIME) 2 g in sodium chloride 0.9 % 100 mL IVPB        2 g 200 mL/hr over 30 Minutes Intravenous Every 8 hours 04/23/22 1844     04/23/22 1915  metroNIDAZOLE (FLAGYL) IVPB 500 mg        500 mg 100 mL/hr over 60 Minutes Intravenous Every 12 hours 04/23/22 1828 04/28/22 1914   04/23/22 1915  doxycycline (VIBRAMYCIN) 100 mg in sodium chloride 0.9 % 250 mL IVPB        100 mg 125 mL/hr over 120 Minutes Intravenous Every 12 hours 04/23/22 1828     04/23/22 0900  fluconazole (DIFLUCAN) tablet 150 mg        150 mg Oral  Once 04/23/22 0810 04/23/22 0853   04/20/22 1800  cefTRIAXone (ROCEPHIN) 1 g in sodium chloride 0.9 % 100 mL IVPB  Status:  Discontinued        1 g 200 mL/hr over 30 Minutes Intravenous Every 24 hours 04/20/22 0134 04/23/22 1829   04/19/22 2200  rifaximin (XIFAXAN) tablet 550 mg  Status:  Discontinued        550 mg Oral 2 times daily 04/19/22 2057 04/23/22 1828   04/19/22 1845  cefTRIAXone (ROCEPHIN) 1 g in sodium chloride 0.9 % 100 mL IVPB        1 g 200 mL/hr over 30 Minutes Intravenous  Once 04/19/22 1838 04/19/22 1956   04/19/22  1845  doxycycline (VIBRA-TABS) tablet 100 mg        100 mg Oral  Once 04/19/22 1838 04/19/22 1912        I have personally reviewed the following labs and images: CBC: Recent Labs  Lab 04/20/22 0307 04/21/22 0345 04/22/22 0347 04/23/22 0511 04/24/22 0400  WBC 7.0 5.4 5.8 6.7 7.6  NEUTROABS 5.2  --   --   --   --   HGB 8.9* 9.2* 9.5* 9.6* 9.7*  HCT 27.0* 28.0* 29.0* 29.6* 29.9*  MCV 95.7 97.2 97.6 96.4 97.4  PLT 99* 100* 110* 124* 109*   BMP &GFR Recent Labs  Lab 04/20/22 0307 04/21/22 0345 04/22/22 0347 04/23/22 0511 04/24/22 0400  NA 133* 136 134* 132* 132*  K 2.8*  3.1* 3.0* 3.6 3.6  CL 102 105 102 101 102  CO2 29 29 30 26 26   GLUCOSE 143* 115* 128* 111* 162*  BUN 9 8 9 9 11   CREATININE 0.61 0.58 0.56 0.57 0.57  CALCIUM 7.8* 7.9* 8.0* 8.3* 8.7*  MG 1.6* 1.8 1.6* 1.8 1.8  PHOS  --  3.0 3.2 3.1 2.8   Estimated Creatinine Clearance: 86.2 mL/min (by C-G formula based on SCr of 0.57 mg/dL). Liver & Pancreas: Recent Labs  Lab 04/19/22 1709 04/20/22 0307 04/21/22 0345 04/22/22 0347 04/23/22 0511 04/24/22 0400  AST 60* 56*  --  48* 46* 39  ALT 25 23  --  22 21 21   ALKPHOS 88 79  --  79 77 78  BILITOT 1.8* 1.6*  --  0.7 1.7* 1.7*  PROT 9.2* 8.5*  --  8.3* 8.7* 9.7*  ALBUMIN <1.5* <1.5* <1.5* <1.5* 2.1* 2.5*   Recent Labs  Lab 04/19/22 1709  LIPASE 35   Recent Labs  Lab 04/19/22 1709 04/22/22 0347 04/23/22 0511  AMMONIA 58* 58* 27   Diabetic: No results for input(s): "HGBA1C" in the last 72 hours. Recent Labs  Lab 04/19/22 1645 04/23/22 1122 04/23/22 1554  GLUCAP 94 96 138*   Cardiac Enzymes: No results for input(s): "CKTOTAL", "CKMB", "CKMBINDEX", "TROPONINI" in the last 168 hours. No results for input(s): "PROBNP" in the last 8760 hours. Coagulation Profile: Recent Labs  Lab 04/24/22 0400  INR 1.5*   Thyroid Function Tests: No results for input(s): "TSH", "T4TOTAL", "FREET4", "T3FREE", "THYROIDAB" in the last 72 hours.  Lipid  Profile: No results for input(s): "CHOL", "HDL", "LDLCALC", "TRIG", "CHOLHDL", "LDLDIRECT" in the last 72 hours. Anemia Panel: No results for input(s): "VITAMINB12", "FOLATE", "FERRITIN", "TIBC", "IRON", "RETICCTPCT" in the last 72 hours.  Urine analysis:    Component Value Date/Time   COLORURINE YELLOW 04/22/2022 1430   APPEARANCEUR CLEAR 04/22/2022 1430   APPEARANCEUR Clear 03/08/2022 1549   LABSPEC 1.016 04/22/2022 1430   PHURINE 5.0 04/22/2022 1430   GLUCOSEU NEGATIVE 04/22/2022 1430   HGBUR SMALL (A) 04/22/2022 1430   BILIRUBINUR NEGATIVE 04/22/2022 1430   BILIRUBINUR Negative 03/08/2022 1549   KETONESUR NEGATIVE 04/22/2022 1430   PROTEINUR NEGATIVE 04/22/2022 1430   UROBILINOGEN negative (A) 07/02/2019 0932   UROBILINOGEN 0.2 04/15/2015 0028   NITRITE NEGATIVE 04/22/2022 1430   LEUKOCYTESUR SMALL (A) 04/22/2022 1430   Sepsis Labs: Invalid input(s): "PROCALCITONIN", "LACTICIDVEN"  Microbiology: Recent Results (from the past 240 hour(s))  Blood culture (routine x 2)     Status: None   Collection Time: 04/19/22  5:09 PM   Specimen: Left Antecubital; Blood  Result Value Ref Range Status   Specimen Description LEFT ANTECUBITAL  Final   Special Requests   Final    BOTTLES DRAWN AEROBIC AND ANAEROBIC Blood Culture adequate volume   Culture   Final    NO GROWTH 5 DAYS Performed at [facility], [address] [place], [place] [address]    Report Status 04/24/2022 FINAL  Final  Blood culture (routine x 2)     Status: None   Collection Time: 04/19/22  5:20 PM   Specimen: BLOOD RIGHT ARM  Result Value Ref Range Status   Specimen Description BLOOD RIGHT ARM  Final   Special Requests   Final    BOTTLES DRAWN AEROBIC AND ANAEROBIC Blood Culture adequate volume   Culture   Final    NO GROWTH 5 DAYS Performed at [facility], [address] [place], [place] [address]  Report Status 04/24/2022 FINAL  Final  Urine Culture     Status: Abnormal   Collection Time: 04/19/22   7:27 PM   Specimen: Urine, Clean Catch  Result Value Ref Range Status   Specimen Description   Final    URINE, CLEAN CATCH Performed at [facility], [address], [place], [place] [address]    Special Requests   Final    NONE Performed at [facility], [address] [place], [place] [address]    Culture >=100,000 COLONIES/mL ENTEROBACTER AEROGENES (A)  Final   Report Status 04/22/2022 FINAL  Final   Organism ID, Bacteria ENTEROBACTER AEROGENES (A)  Final      Susceptibility   Enterobacter aerogenes - MIC*    CEFAZOLIN >=64 RESISTANT Resistant     CEFEPIME <=0.12 SENSITIVE Sensitive     CEFTRIAXONE <=0.25 SENSITIVE Sensitive     CIPROFLOXACIN <=0.25 SENSITIVE Sensitive     GENTAMICIN <=1 SENSITIVE Sensitive     IMIPENEM 0.5 SENSITIVE Sensitive     NITROFURANTOIN 64 INTERMEDIATE Intermediate     TRIMETH/SULFA <=20 SENSITIVE Sensitive     PIP/TAZO <=4 SENSITIVE Sensitive     * >=100,000 COLONIES/mL ENTEROBACTER AEROGENES  MRSA Next Gen by PCR, Nasal     Status: None   Collection Time: 04/23/22  7:00 PM  Result Value Ref Range Status   MRSA by PCR Next Gen NOT DETECTED NOT DETECTED Final    Comment: (NOTE) The GeneXpert MRSA Assay (FDA approved for NASAL specimens only), is one component of a comprehensive MRSA colonization surveillance program. It is not intended to diagnose MRSA infection nor to guide or monitor treatment for MRSA infections. Test performance is not FDA approved in patients less than 70 years old. Performed at [facility], [address], [place], [place] [address]     Radiology Studies: [place] CHEST (PLEURAL EFFUSION)  Result Date: 04/24/2022 CLINICAL DATA:  Request for thoracentesis EXAM: CHEST ULTRASOUND COMPARISON:  CT of 1 day prior FINDINGS: Focused ultrasound demonstrates a small to moderate right-sided pleural effusion. This is felt to be insufficient for safe thoracentesis, given patient's size. IMPRESSION: Small to moderate right-sided pleural  effusion. Felt to be insufficient for thoracentesis. Electronically Signed   By: Abigail Miyamoto M.D.   On: 04/24/2022 10:00   CT Angio Chest Pulmonary Embolism (PE) W or WO Contrast  Result Date: 04/23/2022 CLINICAL DATA:  Shortness of breath x1 week EXAM: CT ANGIOGRAPHY CHEST WITH CONTRAST TECHNIQUE: Multidetector CT imaging of the chest was performed using the standard protocol during bolus administration of intravenous contrast. Multiplanar CT image reconstructions and MIPs were obtained to evaluate the vascular anatomy. RADIATION DOSE REDUCTION: This exam was performed according to the departmental dose-optimization program which includes automated exposure control, adjustment of the mA and/or kV according to patient size and/or use of iterative reconstruction technique. CONTRAST:  59m OMNIPAQUE IOHEXOL 350 MG/ML SOLN COMPARISON:  Chest radiograph dated 04/23/2022. CTA chest dated 09/20/2021. FINDINGS: Cardiovascular: Satisfactory opacification of the bilateral pulmonary arteries to the lobar level. Evaluation is mildly constrained by respiratory motion. Within that constraint, there is no evidence of pulmonary embolism. Although not tailored for evaluation of the thoracic aorta, there is no evidence thoracic aortic aneurysm or dissection. The heart is normal in size.  No pericardial effusion. Mediastinum/Nodes: No suspicious mediastinal lymphadenopathy. Visualized thyroid is unremarkable. Lungs/Pleura: Evaluation of the lung parenchyma is constrained by respiratory motion. Within that constraint, there are no suspicious pulmonary nodules. Multifocal interstitial/patchy opacities in the lungs bilaterally, right lung  predominant, favoring multifocal pneumonia with possible underlying mild interstitial edema. Moderate right and small left pleural effusions. Associated right lower lobe and left basilar atelectasis. No pneumothorax. Upper Abdomen: Visualized upper abdomen is motion degraded but notable for  cirrhosis. Musculoskeletal: Mild degenerative changes of the mid thoracic spine. Review of the MIP images confirms the above findings. IMPRESSION: No evidence of pulmonary embolism. Multifocal pneumonia with possible underlying mild interstitial edema. Moderate right and small left pleural effusions. Electronically Signed   By: Julian Hy M.D.   On: 04/23/2022 17:05      Tyson Parkison T. Steptoe  If 7PM-7AM, please contact night-coverage [email or web address] 04/24/2022, 1:24 PM

## 2022-04-25 DIAGNOSIS — Z515 Encounter for palliative care: Secondary | ICD-10-CM | POA: Diagnosis not present

## 2022-04-25 DIAGNOSIS — Z7189 Other specified counseling: Secondary | ICD-10-CM | POA: Diagnosis not present

## 2022-04-25 DIAGNOSIS — I480 Paroxysmal atrial fibrillation: Secondary | ICD-10-CM

## 2022-04-25 DIAGNOSIS — I5033 Acute on chronic diastolic (congestive) heart failure: Secondary | ICD-10-CM | POA: Diagnosis not present

## 2022-04-25 DIAGNOSIS — E440 Moderate protein-calorie malnutrition: Secondary | ICD-10-CM

## 2022-04-25 DIAGNOSIS — E7220 Disorder of urea cycle metabolism, unspecified: Secondary | ICD-10-CM | POA: Diagnosis not present

## 2022-04-25 DIAGNOSIS — F419 Anxiety disorder, unspecified: Secondary | ICD-10-CM | POA: Diagnosis not present

## 2022-04-25 DIAGNOSIS — K567 Ileus, unspecified: Secondary | ICD-10-CM

## 2022-04-25 DIAGNOSIS — R4182 Altered mental status, unspecified: Secondary | ICD-10-CM | POA: Diagnosis not present

## 2022-04-25 DIAGNOSIS — G9341 Metabolic encephalopathy: Secondary | ICD-10-CM | POA: Diagnosis not present

## 2022-04-25 LAB — RENAL FUNCTION PANEL
Albumin: 2.7 g/dL — ABNORMAL LOW (ref 3.5–5.0)
Anion gap: 4 — ABNORMAL LOW (ref 5–15)
BUN: 24 mg/dL — ABNORMAL HIGH (ref 8–23)
CO2: 27 mmol/L (ref 22–32)
Calcium: 9.5 mg/dL (ref 8.9–10.3)
Chloride: 104 mmol/L (ref 98–111)
Creatinine, Ser: 0.69 mg/dL (ref 0.44–1.00)
GFR, Estimated: >60 mL/min (ref >60)
Glucose, Bld: 94 mg/dL (ref 70–99)
Phosphorus: 3.3 mg/dL (ref 2.5–4.6)
Potassium: 3.5 mmol/L (ref 3.5–5.1)
Sodium: 135 mmol/L (ref 135–145)

## 2022-04-25 LAB — PROCALCITONIN: Procalcitonin: 0.31 ng/mL

## 2022-04-25 LAB — CBC
HCT: 29.4 % — ABNORMAL LOW (ref 36.0–46.0)
Hemoglobin: 9.6 g/dL — ABNORMAL LOW (ref 12.0–15.0)
MCH: 31.7 pg (ref 26.0–34.0)
MCHC: 32.7 g/dL (ref 30.0–36.0)
MCV: 97 fL (ref 80.0–100.0)
Platelets: 144 10*3/uL — ABNORMAL LOW (ref 150–400)
RBC: 3.03 MIL/uL — ABNORMAL LOW (ref 3.87–5.11)
RDW: 15.6 % — ABNORMAL HIGH (ref 11.5–15.5)
WBC: 14.9 10*3/uL — ABNORMAL HIGH (ref 4.0–10.5)
nRBC: 0 % (ref 0.0–0.2)

## 2022-04-25 LAB — COMPREHENSIVE METABOLIC PANEL
ALT: 19 U/L (ref 0–44)
AST: 31 U/L (ref 15–41)
Albumin: 3 g/dL — ABNORMAL LOW (ref 3.5–5.0)
Alkaline Phosphatase: 69 U/L (ref 38–126)
Anion gap: 8 (ref 5–15)
BUN: 17 mg/dL (ref 8–23)
CO2: 26 mmol/L (ref 22–32)
Calcium: 9.5 mg/dL (ref 8.9–10.3)
Chloride: 103 mmol/L (ref 98–111)
Creatinine, Ser: 0.6 mg/dL (ref 0.44–1.00)
GFR, Estimated: >60 mL/min (ref >60)
Glucose, Bld: 124 mg/dL — ABNORMAL HIGH (ref 70–99)
Potassium: 3.1 mmol/L — ABNORMAL LOW (ref 3.5–5.1)
Sodium: 137 mmol/L (ref 135–145)
Total Bilirubin: 1.3 mg/dL — ABNORMAL HIGH (ref 0.3–1.2)
Total Protein: 9.8 g/dL — ABNORMAL HIGH (ref 6.5–8.1)

## 2022-04-25 LAB — ECHOCARDIOGRAM COMPLETE
AR max vel: 2.03 cm2
AV Area VTI: 2.03 cm2
AV Area mean vel: 1.98 cm2
AV Mean grad: 12 mmHg
AV Peak grad: 19.7 mmHg
Ao pk vel: 2.22 m/s
Area-P 1/2: 2.83 cm2
Height: 64 in
S' Lateral: 2.2 cm
Weight: 4158.76 oz

## 2022-04-25 LAB — PHOSPHORUS: Phosphorus: 3.3 mg/dL (ref 2.5–4.6)

## 2022-04-25 LAB — LEGIONELLA PNEUMOPHILA SEROGP 1 UR AG: L. pneumophila Serogp 1 Ur Ag: NEGATIVE

## 2022-04-25 LAB — AMMONIA: Ammonia: 47 umol/L — ABNORMAL HIGH (ref 9–35)

## 2022-04-25 LAB — PROTIME-INR
INR: 1.6 — ABNORMAL HIGH (ref 0.8–1.2)
Prothrombin Time: 18.7 seconds — ABNORMAL HIGH (ref 11.4–15.2)

## 2022-04-25 LAB — MAGNESIUM
Magnesium: 2 mg/dL (ref 1.7–2.4)
Magnesium: 1.8 mg/dL (ref 1.7–2.4)

## 2022-04-25 MED ORDER — FENTANYL CITRATE PF 50 MCG/ML IJ SOSY
25.0000 ug | PREFILLED_SYRINGE | Freq: Once | INTRAMUSCULAR | Status: AC
  Administered 2022-04-25: 25 ug via INTRAVENOUS
  Filled 2022-04-25: qty 1

## 2022-04-25 MED ORDER — MAGNESIUM SULFATE 2 GM/50ML IV SOLN
2.0000 g | Freq: Once | INTRAVENOUS | Status: AC
  Administered 2022-04-25: 2 g via INTRAVENOUS
  Filled 2022-04-25: qty 50

## 2022-04-25 MED ORDER — SIMETHICONE 80 MG PO CHEW
80.0000 mg | CHEWABLE_TABLET | Freq: Once | ORAL | Status: AC
  Administered 2022-04-25: 80 mg via ORAL
  Filled 2022-04-25: qty 1

## 2022-04-25 MED ORDER — SODIUM CHLORIDE 0.9 % IV BOLUS
500.0000 mL | Freq: Once | INTRAVENOUS | Status: AC
  Administered 2022-04-25: 500 mL via INTRAVENOUS

## 2022-04-25 MED ORDER — METOPROLOL TARTRATE 5 MG/5ML IV SOLN
2.5000 mg | INTRAVENOUS | Status: DC | PRN
  Administered 2022-04-25: 2.5 mg via INTRAVENOUS
  Filled 2022-04-25 (×2): qty 5

## 2022-04-25 MED ORDER — POTASSIUM CHLORIDE CRYS ER 20 MEQ PO TBCR
40.0000 meq | EXTENDED_RELEASE_TABLET | ORAL | Status: AC
  Administered 2022-04-25 (×2): 40 meq via ORAL
  Filled 2022-04-25 (×2): qty 2

## 2022-04-25 MED ORDER — METOPROLOL TARTRATE 25 MG PO TABS
12.5000 mg | ORAL_TABLET | Freq: Two times a day (BID) | ORAL | Status: DC
  Administered 2022-04-25 – 2022-04-26 (×4): 12.5 mg via ORAL
  Filled 2022-04-25 (×4): qty 1

## 2022-04-25 MED ORDER — DILTIAZEM HCL-DEXTROSE 125-5 MG/125ML-% IV SOLN (PREMIX)
5.0000 mg/h | INTRAVENOUS | Status: DC
  Administered 2022-04-25: 10 mg/h via INTRAVENOUS
  Administered 2022-04-25: 5 mg/h via INTRAVENOUS
  Administered 2022-04-26: 10 mg/h via INTRAVENOUS
  Filled 2022-04-25 (×4): qty 125

## 2022-04-25 MED ORDER — DILTIAZEM LOAD VIA INFUSION
15.0000 mg | Freq: Once | INTRAVENOUS | Status: AC
  Administered 2022-04-25: 15 mg via INTRAVENOUS
  Filled 2022-04-25: qty 15

## 2022-04-25 MED ORDER — MIDODRINE HCL 5 MG PO TABS
5.0000 mg | ORAL_TABLET | Freq: Two times a day (BID) | ORAL | Status: DC
  Administered 2022-04-25 – 2022-04-27 (×4): 5 mg via ORAL
  Filled 2022-04-25 (×4): qty 1

## 2022-04-25 MED ORDER — MIDODRINE HCL 5 MG PO TABS: 5.0000 mg | ORAL_TABLET | Freq: Two times a day (BID) | ORAL | Status: DC

## 2022-04-25 MED ORDER — METOCLOPRAMIDE HCL 5 MG/ML IJ SOLN
5.0000 mg | Freq: Three times a day (TID) | INTRAMUSCULAR | Status: DC
  Administered 2022-04-25 – 2022-04-27 (×9): 5 mg via INTRAVENOUS
  Filled 2022-04-25 (×9): qty 2

## 2022-04-25 MED ORDER — SODIUM CHLORIDE 0.9 % IV BOLUS: 500.0000 mL | Freq: Once | INTRAVENOUS | Status: AC

## 2022-04-25 MED ORDER — LORAZEPAM 2 MG/ML IJ SOLN
0.5000 mg | Freq: Two times a day (BID) | INTRAMUSCULAR | Status: DC | PRN
  Administered 2022-04-26: 0.5 mg via INTRAVENOUS
  Filled 2022-04-25: qty 1

## 2022-04-25 NOTE — TOC Initial Note (Signed)
Transition of Care [facility]) - Initial/Assessment Note    Patient Details  Name: Yolanda [name] MRN: [id number] Date of Birth: [birth year]  Transition [facility]) CM/SW Contact:    Yolanda [name], [place] Phone Number: 04/25/2022, 1:28 PM  Clinical Narrative:                 Pt is high risk for readmission. CSW completed assessment with pts daughter Yolanda [name]. Pt lives with daughter Yolanda [name]. Pt is mostly independent in completing her ADLs. Pts daughter Yolanda [name] provides transportation as needed. Pts daughter is interested in [facility] at D/C. Pt is to have palliative consult. Family requests that TOC follow up after palliative conversation to see what DME may be needed prior to D/C. TOC to follow.   Expected Discharge Plan: [place] Barriers to Discharge: Continued Medical Work up   Patient Goals and CMS Choice Patient states their goals for this hospitalization and ongoing recovery are:: return home CMS Medicare.gov Compare Post Acute Care list provided to:: Patient Represenative (must comment) Choice offered to / list presented to : Adult Children  Expected Discharge Plan and Services Expected Discharge Plan: [place] In-house Referral: Clinical Social Work Discharge Planning Services: CM Consult Post Acute Care Choice: [place] arrangements for the past 2 months: [place]                                      Prior Living Arrangements/Services Living arrangements for the past 2 months: Single Family Home Lives with:: Adult Children Patient language and need for interpreter reviewed:: Yes Do you feel safe going back to the place where you live?: Yes      Need for Family Participation in Patient Care: Yes (Comment) Care giver support system in place?: Yes (comment)   Criminal Activity/Legal Involvement Pertinent to Current Situation/Hospitalization: No - Comment as needed  Activities of Daily Living Home Assistive  Devices/Equipment: [place] Rile (specify type) ADL Screening (condition at time of admission) Patient's cognitive ability adequate to safely complete daily activities?: Yes Is the patient deaf or have difficulty hearing?: No Does the patient have difficulty seeing, even when wearing glasses/contacts?: No Does the patient have difficulty concentrating, remembering, or making decisions?: No Patient able to express need for assistance with ADLs?: Yes Does the patient have difficulty dressing or bathing?: Yes Independently performs ADLs?: Yes (appropriate for developmental age) Does the patient have difficulty walking or climbing stairs?: Yes Weakness of Legs: Both Weakness of Arms/Hands: Both  Permission Sought/Granted                  Emotional Assessment Appearance:: Appears stated age Attitude/Demeanor/Rapport: Engaged Affect (typically observed): Accepting   Alcohol / Substance Use: Not Applicable Psych Involvement: No (comment)  Admission diagnosis:  Hypokalemia [E87.6] Hypoalbuminemia [E88.09] History of cirrhosis [Z87.19] Acute cystitis without hematuria [N30.00] Chronic respiratory failure with hypoxia (HCC) [J96.11] Altered mental status, unspecified altered mental status type [L97.67] Acute metabolic encephalopathy [H41.93] Anemia, unspecified type [D64.9] Pneumonia of both lungs due to infectious organism, unspecified part of lung [J18.9] Patient Active Problem List   Diagnosis Date Noted   Paroxysmal atrial fibrillation with RVR ([place]) 04/25/2022   Ileus ([place]) 04/25/2022   Malnutrition of moderate degree 04/24/2022   Acute on chronic diastolic CHF (congestive heart failure) ([place]) 79/08/4095   Acute metabolic encephalopathy 35/32/9924   Urinary urgency 12/29/2021   Seizure (  [place]) 12/07/2021   Generalized weakness 12/07/2021   Hypoalbuminemia due to protein-calorie malnutrition ([place]) 12/07/2021   Hyperammonemia /Hepatic encephalopathy 12/07/2021   UTI (urinary tract  infection) 12/07/2021   Elevated MCV 12/07/2021   Essential tremor 12/07/2021   Psoriatic arthritis ([place]) 12/07/2021   Asthma, chronic 12/07/2021   Acute hepatic encephalopathy/nNASH Liver Cirrhosis 12/07/2021   Chronic respiratory failure with hypoxia ([place]) 11/27/2021   Pulmonary infiltrates 11/27/2021   Upper airway cough syndrome vs cough variant asthma 11/27/2021   Morbid obesity ([place]) 11/02/2021   Pulmonary alveolar hemorrhage 09/20/2021   Multifocal pneumonia 09/20/2021   Hyperglycemia 09/20/2021   Oral candida 01/30/2020   Gastric nodule    Personal history of colonic polyps 01/01/2019   Liver cirrhosis secondary to NASH ([place]) 01/22/2018   Dysphagia 04/19/2017   Abdominal pain, epigastric 04/19/2017   Abdominal pain 12/08/2014   Postmenopausal bleeding 11/18/2014   Closed fracture of distal clavicle 04/17/2014   Muscle weakness (generalized) 03/26/2014   Pain in joint, shoulder region 03/26/2014   Decreased range of motion of right shoulder 03/26/2014   Right clavicle fracture 03/19/2014   Fatty liver 03/17/2014   Rectal bleeding 03/17/2014   Dyspepsia 03/11/2014   Obesity, Class III, BMI 40-49.9 (morbid obesity) ([place]) 09/11/2012   Hypothyroidism 09/11/2012   ADHD (attention deficit hyperactivity disorder) 09/11/2012   Anxiety 09/11/2012   GERD (gastroesophageal reflux disease) 12/21/2009   IRRITABLE BOWEL SYNDROME 04/22/2009   Mixed hyperlipidemia 07/09/2008   Anxiety and depression 07/09/2008   Essential hypertension 07/09/2008   Gastritis and gastroduodenitis 07/09/2008   Sleep apnea 07/09/2008   PCP:  Celene Squibb, MD Pharmacy:   [place], [place] - [place] [address] [place] [address] Phone: [phone] Fax: [phone]     Social Determinants of Health (SDOH) Interventions    Readmission Risk Interventions    04/25/2022    1:27 PM 09/22/2021   11:13 AM  Readmission Risk Prevention Plan  Post Dischage  Appt  Complete  Medication Screening  Complete  Transportation Screening Complete Complete  HRI or Home Care Consult Complete   Social Work Consult for [place] Planning/Counseling Complete   Palliative Care Screening Not Applicable   Medication Review Press photographer) Complete

## 2022-04-25 NOTE — Progress Notes (Signed)
PT Cancellation Note  Patient Details Name: Yolanda [name] MRN: [id number] DOB: 07-05-1956   Cancelled Treatment:    Reason Eval/Treat Not Completed: Medical issues which prohibited therapy.  PT held due to patient having HR increased above 160 BPM with activity - RN aware.   1:46 PM, 04/25/22 Lonell Grandchild, MPT Physical Therapist with [facility] 336 [phone] office [phone] mobile phone

## 2022-04-25 NOTE — Progress Notes (Signed)
PROGRESS NOTE  NOEMIE DEVIVO AYT:[id number] DOB: 1955-08-07   PCP: Celene Squibb, MD  Patient is from: Home.  Lives with daughter.  Uses rollator at baseline.  DOA: 04/19/2022 LOS: 5  Chief complaints Chief Complaint  Patient presents with   Altered Mental Status     Brief Narrative / Interim history: 66 year old F with PMH of NASH liver cirrhosis, morbid obesity, PE not on AC, OSA, HTN, anxiety, depression, GERD, hypothyroidism and psoriatic arthritis presenting with progressive shortness of breath, disorientation, fatigue and nausea, and admitted for acute metabolic encephalopathy in the setting of hepatic encephalopathy, pneumonia and UTI.  CXR also raised concern for cardiomegaly with small pleural effusion and mixed interstitial and airspace opacities concerning for edema.  He was started on IV Lasix.  MRI brain was negative for acute finding.  Patient had acute respiratory distress the evening of 10/15.  CTA chest negative for PE but multifocal pneumonia and moderate right pleural effusion.  Antibiotics escalated.  Diuretics increased.  She was transferred to stepdown unit for BiPAP.  IR consulted but right pleural effusion felt to be insufficient for thoracocentesis.  Palliative medicine consulted for goals of care discussion.  Patient developed severe abdominal pain the evening of 10/16.  KUB and CT abdomen and pelvis raised concern for ileus.  She also went into A-fib with RVR the morning of 10/17, and started on Cardizem drip.  Subjective: Patient had severe abdominal pain last night.  KUB and CT abdomen and pelvis concerning for ileus.  Pain improved after lactulose enema last night until this morning.  She had an episode of emesis.  She had intermittent pain this morning.  She also reports feeling "queasy".  She went into A-fib with RVR later in the morning.  Feeling better from breathing standpoint.  She had 4.7 L UOP/24 hours.  Patient's daughter at  bedside.  Objective: Vitals:   04/25/22 0600 04/25/22 0700 04/25/22 0745 04/25/22 0752  BP:    (!) 173/88  Pulse: (!) 106 (!) 104  94  Resp:  (!) 40  19  Temp:   97.7 F (36.5 C)   TempSrc:   Oral   SpO2: 98% 97%  97%  Weight:      Height:        Examination:  GENERAL: No apparent distress.  Nontoxic. HEENT: MMM.  Vision and hearing grossly intact.  NECK: Supple.  Difficult to assess JVD due to body habitus RESP:  No IWOB.  Somewhat diminished aeration bilaterally but limited exam due to body habitus. CVS: IR IR.  Heart sounds normal.  ABD/GI/GU: BS+. Abd soft, NTND.  MSK/EXT:  Moves extremities. No apparent deformity. No edema.  SKIN: no apparent skin lesion or wound NEURO: Awake but not alert.  Fairly oriented.  No apparent focal neuro deficit. PSYCH: Calm. Normal affect.   Procedures:  None  Microbiology summarized: 10/11-blood cultures NGTD 10/11-urine culture with Enterobacter aerogenes. 10/15-MRSA PCR screen nonreactive.  Assessment and plan: Principal Problem:   Acute metabolic encephalopathy Active Problems:   Hyperammonemia /Hepatic encephalopathy   UTI (urinary tract infection)   Mixed hyperlipidemia   Anxiety and depression   Essential hypertension   GERD (gastroesophageal reflux disease)   Obesity, Class III, BMI 40-49.9 (morbid obesity) (HCC)   Hypothyroidism   Chronic respiratory failure with hypoxia (HCC)   Asthma, chronic   Acute on chronic diastolic CHF (congestive heart failure) (HCC)   Malnutrition of moderate degree   Paroxysmal atrial fibrillation with RVR (HCC)   Ileus (  [place])  Acute respiratory distress/chronic hypoxic RF on 3 L-multifactorial including pneumonia, possible diastolic CHF, pleural effusion, asthma and underlying OSA/possible OHS.  Fluid status and breathing seems to have improved. CTA negative for PE but multifocal pneumonia and effusion.  Cardiac enzymes and EKG negative. -Continue care in stepdown -Continue BiPAP as  needed and when she sleeps -Treat treatable causes as below. -Encourage incentive spirometry  Acute diastolic CHF: Had PND, orthopnea and DOE for months UA. Last TTE in 11/2021 with LVEF of 65 to 70% and indeterminate DD.  Difficult to assess fluid status due to body habitus.  CT chest on 10/15 with moderate right pleural effusion and some vascular congestion despite IV Lasix.  Fluid status seems to have improved after increasing Lasix. -Continue BiPAP as needed and at night. -Hold IV Lasix.  She got 80 mg earlier in the morning -Hold Aldactone -Strict intake and output.  -Follow TTE. -Indwelling Foley due to measure output. Patient cannot tolerate [place] and has significant DOE. Patient and family understand risk of infection.  Currently she is on broad-spectrum antibiotics for pneumonia  Multifocal pneumonia: CTA chest with multifocal pneumonia and moderate right and mild left pleural effusion.  Difficult to tell if CTA finding is active pneumonia or aftereffect.  Pro-Cal mildly elevated to 0.35>> 0.31.  MRSA PCR negative. -Continue cefepime and doxycycline -Discontinued vancomycin.  Discontinue Flagyl.  Could be contributing to GI symptoms.  A-fib with RVR: Likely provoked.  Overdiuresis?  No history of A-fib. -Hold diuretics and give NS bolus x1 -Start Cardizem drip -Hold off anticoagulation for now given coagulopathy. -If it does not improve, cardiology consult -Optimize electrolytes.  Acute metabolic encephalopathy: Multifactorial including hepatic encephalopathy and infection. TSH and B12 within normal.  She is awake but not quite alert.   -Continue lactulose enema twice daily -Manage infection as above. -Reorientation and delirium precautions. -Discontinue Ativan and fentanyl.  Abdominal pain/ileus: Was not on opiate or meds with anticholinergic effect.  She had diarrhea with lactulose until 10/15.  Lactulose decreased on 10/16 -Discontinue opiates -Mobilize when  stable -Continue lactulose -Scheduled antiemetics with as needed  NASH cirrhosis/elevated AST/hyperbilirubinemia/coagulopathy: Stable. -Continue lactulose for elevated ammonia. -Hold home rifaximin while on antibiotics  Urinary tract infection: Worsening urge incontinence POA.  Urine culture with Enterobacter aerogenes.  Completed appropriate antibiotic course.    Hypokalemia/hypomagnesemia:  -P.o. KCl 40x x2  Hypothyroidism: TSH within normal. -Continue home Synthroid   GERD -Continue PPI   Essential hypertension: BP within acceptable range. -Diuretics as above.   Mixed hyperlipidemia -Continue home Crestor  Generalized weakness/physical deconditioning -PT/OT eval  Normocytic anemia/thrombocytopenia: Likely due to liver cirrhosis.  Anemia panel suggests anemia of chronic disease.  Improving. -Monitor.  Hypoalbuminemia -IV albumin as above.  Increased protein gap: Serum protein 8.7.  Albumin 2.1.  UA without proteinuria.  No history of MM -Follow SPEP  Goal of care counseling: Remains full code. -See IPAL note on 10/16 -Palliative medicine consulted.  Morbid obesity Body mass index is 42.8 kg/m.  Moderate malnutrition Nutrition Problem: Moderate Malnutrition Etiology: acute illness (hepatic encephalopathy, AMS,taste change, loss of appetite) Signs/Symptoms: energy intake < 75% for > 7 days, mild muscle depletion (taste change, loss of appetite) Interventions: Premier Protein, Prostat, MVI   DVT prophylaxis:  heparin injection 5,000 Units Start: 04/19/22 2200 SCDs Start: 04/19/22 2058  Code Status: Full code Family Communication: Updated patient's daughter at bedside. Level of care: Stepdown Status is: Inpatient The patient will remain inpatient because: Acute CHF, A-fib with RVR, pneumonia and  ileus,   Final disposition: Likely home in the next 24 hours. Consultants:  Interventional radiology Palliative medicine  Sch Meds:  Scheduled Meds:  aspirin  EC  81 mg Oral Daily   Chlorhexidine Gluconate Cloth  6 each Topical Daily   DULoxetine  60 mg Oral Daily   heparin  5,000 Units Subcutaneous Q8H   lactulose  300 mL Rectal BID   levothyroxine  75 mcg Oral Q0600   metoCLOPramide (REGLAN) injection  5 mg Intravenous TID AC & HS   metoprolol tartrate  12.5 mg Oral BID   mometasone-formoterol  2 puff Inhalation BID   multivitamin with minerals  1 tablet Oral Daily   pantoprazole  40 mg Oral Daily   potassium chloride  40 mEq Oral Q4H   Ensure Max Protein  11 oz Oral BID   Continuous Infusions:  ceFEPime (MAXIPIME) IV 2 g (04/25/22 1216)   diltiazem (CARDIZEM) infusion 5 mg/hr (04/25/22 1212)   doxycycline (VIBRAMYCIN) IV 100 mg (04/25/22 1029)   sodium chloride 250 mL/hr at 04/25/22 1155   PRN Meds:.acetaminophen **OR** acetaminophen, ipratropium-albuterol, ondansetron **OR** ondansetron (ZOFRAN) IV  Antimicrobials: Anti-infectives (From admission, onward)    Start     Dose/Rate Route Frequency Ordered Stop   04/24/22 2030  vancomycin (VANCOREADY) IVPB 1250 mg/250 mL  Status:  Discontinued       See Hyperspace for full Linked Orders Report.   1,250 mg 166.7 mL/hr over 90 Minutes Intravenous Every 24 hours 04/23/22 1856 04/24/22 0843   04/23/22 2030  vancomycin (VANCOREADY) IVPB 2000 mg/400 mL       See Hyperspace for full Linked Orders Report.   2,000 mg 200 mL/hr over 120 Minutes Intravenous  Once 04/23/22 1856 04/24/22 1259   04/23/22 2000  ceFEPIme (MAXIPIME) 2 g in sodium chloride 0.9 % 100 mL IVPB        2 g 200 mL/hr over 30 Minutes Intravenous Every 8 hours 04/23/22 1844     04/23/22 1915  metroNIDAZOLE (FLAGYL) IVPB 500 mg  Status:  Discontinued        500 mg 100 mL/hr over 60 Minutes Intravenous Every 12 hours 04/23/22 1828 04/25/22 1146   04/23/22 1915  doxycycline (VIBRAMYCIN) 100 mg in sodium chloride 0.9 % 250 mL IVPB        100 mg 125 mL/hr over 120 Minutes Intravenous Every 12 hours 04/23/22 1828     04/23/22  0900  fluconazole (DIFLUCAN) tablet 150 mg        150 mg Oral  Once 04/23/22 0810 04/23/22 0853   04/20/22 1800  cefTRIAXone (ROCEPHIN) 1 g in sodium chloride 0.9 % 100 mL IVPB  Status:  Discontinued        1 g 200 mL/hr over 30 Minutes Intravenous Every 24 hours 04/20/22 0134 04/23/22 1829   04/19/22 2200  rifaximin (XIFAXAN) tablet 550 mg  Status:  Discontinued        550 mg Oral 2 times daily 04/19/22 2057 04/23/22 1828   04/19/22 1845  cefTRIAXone (ROCEPHIN) 1 g in sodium chloride 0.9 % 100 mL IVPB        1 g 200 mL/hr over 30 Minutes Intravenous  Once 04/19/22 1838 04/19/22 1956   04/19/22 1845  doxycycline (VIBRA-TABS) tablet 100 mg        100 mg Oral  Once 04/19/22 1838 04/19/22 1912        I have personally reviewed the following labs and images: CBC: Recent Labs  Lab 04/20/22 0307 04/21/22  0345 04/22/22 0347 04/23/22 0511 04/24/22 0400 04/25/22 0330  WBC 7.0 5.4 5.8 6.7 7.6 14.9*  NEUTROABS 5.2  --   --   --   --   --   HGB 8.9* 9.2* 9.5* 9.6* 9.7* 9.6*  HCT 27.0* 28.0* 29.0* 29.6* 29.9* 29.4*  MCV 95.7 97.2 97.6 96.4 97.4 97.0  PLT 99* 100* 110* 124* 109* 144*   BMP &GFR Recent Labs  Lab 04/21/22 0345 04/22/22 0347 04/23/22 0511 04/24/22 0400 04/25/22 0330  NA 136 134* 132* 132* 137  K 3.1* 3.0* 3.6 3.6 3.1*  CL 105 102 101 102 103  CO2 29 30 26 26 26   GLUCOSE 115* 128* 111* 162* 124*  BUN 8 9 9 11 17   CREATININE 0.58 0.56 0.57 0.57 0.60  CALCIUM 7.9* 8.0* 8.3* 8.7* 9.5  MG 1.8 1.6* 1.8 1.8 2.0  PHOS 3.0 3.2 3.1 2.8 3.3   Estimated Creatinine Clearance: 84.1 mL/min (by C-G formula based on SCr of 0.6 mg/dL). Liver & Pancreas: Recent Labs  Lab 04/20/22 0307 04/21/22 0345 04/22/22 0347 04/23/22 0511 04/24/22 0400 04/25/22 0330  AST 56*  --  48* 46* 39 31  ALT 23  --  22 21 21 19   ALKPHOS 79  --  79 77 78 69  BILITOT 1.6*  --  0.7 1.7* 1.7* 1.3*  PROT 8.5*  --  8.3* 8.7* 9.7* 9.8*  ALBUMIN <1.5* <1.5* <1.5* 2.1* 2.5* 3.0*   Recent Labs   Lab 04/19/22 1709  LIPASE 35   Recent Labs  Lab 04/19/22 1709 04/22/22 0347 04/23/22 0511 04/25/22 0330  AMMONIA 58* 58* 27 47*   Diabetic: No results for input(s): "HGBA1C" in the last 72 hours. Recent Labs  Lab 04/19/22 1645 04/23/22 1122 04/23/22 1554  GLUCAP 94 96 138*   Cardiac Enzymes: No results for input(s): "CKTOTAL", "CKMB", "CKMBINDEX", "TROPONINI" in the last 168 hours. No results for input(s): "PROBNP" in the last 8760 hours. Coagulation Profile: Recent Labs  Lab 04/24/22 0400 04/25/22 0330  INR 1.5* 1.6*   Thyroid Function Tests: No results for input(s): "TSH", "T4TOTAL", "FREET4", "T3FREE", "THYROIDAB" in the last 72 hours.  Lipid Profile: No results for input(s): "CHOL", "HDL", "LDLCALC", "TRIG", "CHOLHDL", "LDLDIRECT" in the last 72 hours. Anemia Panel: No results for input(s): "VITAMINB12", "FOLATE", "FERRITIN", "TIBC", "IRON", "RETICCTPCT" in the last 72 hours.  Urine analysis:    Component Value Date/Time   COLORURINE YELLOW 04/22/2022 1430   APPEARANCEUR CLEAR 04/22/2022 1430   APPEARANCEUR Clear 03/08/2022 1549   LABSPEC 1.016 04/22/2022 1430   PHURINE 5.0 04/22/2022 1430   GLUCOSEU NEGATIVE 04/22/2022 1430   HGBUR SMALL (A) 04/22/2022 1430   BILIRUBINUR NEGATIVE 04/22/2022 1430   BILIRUBINUR Negative 03/08/2022 1549   KETONESUR NEGATIVE 04/22/2022 1430   PROTEINUR NEGATIVE 04/22/2022 1430   UROBILINOGEN negative (A) 07/02/2019 0932   UROBILINOGEN 0.2 04/15/2015 0028   NITRITE NEGATIVE 04/22/2022 1430   LEUKOCYTESUR SMALL (A) 04/22/2022 1430   Sepsis Labs: Invalid input(s): "PROCALCITONIN", "LACTICIDVEN"  Microbiology: Recent Results (from the past 240 hour(s))  Blood culture (routine x 2)     Status: None   Collection Time: 04/19/22  5:09 PM   Specimen: Left Antecubital; Blood  Result Value Ref Range Status   Specimen Description LEFT ANTECUBITAL  Final   Special Requests   Final    BOTTLES DRAWN AEROBIC AND ANAEROBIC Blood  Culture adequate volume   Culture   Final    NO GROWTH 5 DAYS Performed at [facility],  [address], [place], [place] [address]    Report Status 04/24/2022 FINAL  Final  Blood culture (routine x 2)     Status: None   Collection Time: 04/19/22  5:20 PM   Specimen: BLOOD RIGHT ARM  Result Value Ref Range Status   Specimen Description BLOOD RIGHT ARM  Final   Special Requests   Final    BOTTLES DRAWN AEROBIC AND ANAEROBIC Blood Culture adequate volume   Culture   Final    NO GROWTH 5 DAYS Performed at [facility], [address], [place], [place] [address]    Report Status 04/24/2022 FINAL  Final  Urine Culture     Status: Abnormal   Collection Time: 04/19/22  7:27 PM   Specimen: Urine, Clean Catch  Result Value Ref Range Status   Specimen Description   Final    URINE, CLEAN CATCH Performed at [facility], [address] [place], [place] [address]    Special Requests   Final    NONE Performed at [facility], [address] [place], [place] [address]    Culture >=100,000 COLONIES/mL ENTEROBACTER AEROGENES (A)  Final   Report Status 04/22/2022 FINAL  Final   Organism ID, Bacteria ENTEROBACTER AEROGENES (A)  Final      Susceptibility   Enterobacter aerogenes - MIC*    CEFAZOLIN >=64 RESISTANT Resistant     CEFEPIME <=0.12 SENSITIVE Sensitive     CEFTRIAXONE <=0.25 SENSITIVE Sensitive     CIPROFLOXACIN <=0.25 SENSITIVE Sensitive     GENTAMICIN <=1 SENSITIVE Sensitive     IMIPENEM 0.5 SENSITIVE Sensitive     NITROFURANTOIN 64 INTERMEDIATE Intermediate     TRIMETH/SULFA <=20 SENSITIVE Sensitive     PIP/TAZO <=4 SENSITIVE Sensitive     * >=100,000 COLONIES/mL ENTEROBACTER AEROGENES  MRSA Next Gen by PCR, Nasal     Status: None   Collection Time: 04/23/22  7:00 PM  Result Value Ref Range Status   MRSA by PCR Next Gen NOT DETECTED NOT DETECTED Final    Comment: (NOTE) The GeneXpert MRSA Assay (FDA approved for NASAL specimens only), is one component of a comprehensive MRSA  colonization surveillance program. It is not intended to diagnose MRSA infection nor to guide or monitor treatment for MRSA infections. Test performance is not FDA approved in patients less than 55 years old. Performed at [facility], [address], [place], [place] [address]     Radiology Studies: ECHOCARDIOGRAM COMPLETE  Result Date: 04/25/2022    ECHOCARDIOGRAM REPORT   Patient Name:   RASHEDA LEDGER Date of Exam: 04/24/2022 Medical Rec #:  [id number]          Height:       64.0 in Accession #:    [id number]         Weight:       259.9 lb Date of Birth:  [birth year]         BSA:          2.187 m Patient Age:    5 years           BP:           157/73 mmHg Patient Gender: F                  HR:           103 bpm. Exam Location:  [facility] Procedure: 2D Echo, Cardiac Doppler and Color Doppler Indications:    CHF-Acute Diastolic T46.56  History:  Patient has prior history of Echocardiogram examinations, most                 recent 12/07/2021. Stroke; Risk Factors:Hypertension,                 Dyslipidemia and Former Smoker. Morbid Obesity.  Sonographer:    Alvino Chapel RCS Referring Phys: [id number] Charlesetta Ivory Breeanne Oblinger IMPRESSIONS  1. Left ventricular ejection fraction, by estimation, is 70 to 75%. The left ventricle has hyperdynamic function. The left ventricle has no regional wall motion abnormalities. There is mild left ventricular hypertrophy. Left ventricular diastolic parameters are consistent with Grade I diastolic dysfunction (impaired relaxation).  2. Right ventricular systolic function is normal. The right ventricular size is normal. Tricuspid regurgitation signal is inadequate for assessing PA pressure.  3. Left atrial size was mildly dilated.  4. The mitral valve is grossly normal. Trivial mitral valve regurgitation.  5. The aortic valve is tricuspid. There is mild calcification of the aortic valve. Aortic valve regurgitation is not visualized. Aortic valve sclerosis/calcification is  present, without any evidence of aortic stenosis. Aortic valve mean gradient measures 12.0 mmHg.  6. The inferior vena cava is dilated in size with >50% respiratory variability, suggesting right atrial pressure of 8 mmHg. Comparison(s): No significant change from prior study. Prior images reviewed side by side. FINDINGS  Left Ventricle: Left ventricular ejection fraction, by estimation, is 70 to 75%. The left ventricle has hyperdynamic function. The left ventricle has no regional wall motion abnormalities. Definity contrast agent was given IV to delineate the left ventricular endocardial borders. The left ventricular internal cavity size was normal in size. There is mild left ventricular hypertrophy. Left ventricular diastolic parameters are consistent with Grade I diastolic dysfunction (impaired relaxation). Right Ventricle: The right ventricular size is normal. No increase in right ventricular wall thickness. Right ventricular systolic function is normal. Tricuspid regurgitation signal is inadequate for assessing PA pressure. Left Atrium: Left atrial size was mildly dilated. Right Atrium: Right atrial size was normal in size. Pericardium: There is no evidence of pericardial effusion. Presence of epicardial fat layer. Mitral Valve: The mitral valve is grossly normal. Mild mitral annular calcification. Trivial mitral valve regurgitation. Tricuspid Valve: The tricuspid valve is grossly normal. Tricuspid valve regurgitation is trivial. Aortic Valve: The aortic valve is tricuspid. There is mild calcification of the aortic valve. Aortic valve regurgitation is not visualized. Aortic valve sclerosis/calcification is present, without any evidence of aortic stenosis. Aortic valve mean gradient measures 12.0 mmHg. Aortic valve peak gradient measures 19.7 mmHg. Aortic valve area, by VTI measures 2.03 cm. Pulmonic Valve: The pulmonic valve was grossly normal. Pulmonic valve regurgitation is trivial. Aorta: The aortic root is  normal in size and structure. Venous: The inferior vena cava is dilated in size with greater than 50% respiratory variability, suggesting right atrial pressure of 8 mmHg. IAS/Shunts: No atrial level shunt detected by color flow Doppler.  LEFT VENTRICLE PLAX 2D LVIDd:         4.50 cm LVIDs:         2.20 cm LV PW:         1.20 cm LV IVS:        1.20 cm LVOT diam:     1.90 cm LV SV:         89 LV SV Index:   41 LVOT Area:     2.84 cm  RIGHT VENTRICLE RV S prime:     22.20 cm/s TAPSE (M-mode): 2.3 cm LEFT ATRIUM  Index        RIGHT ATRIUM           Index LA diam:        3.50 cm 1.60 cm/m   RA Area:     14.90 cm LA Vol (A2C):   59.5 ml 27.21 ml/m  RA Volume:   38.20 ml  17.47 ml/m LA Vol (A4C):   82.2 ml 37.59 ml/m LA Biplane Vol: 71.6 ml 32.74 ml/m  AORTIC VALVE AV Area (Vmax):    2.03 cm AV Area (Vmean):   1.98 cm AV Area (VTI):     2.03 cm AV Vmax:           222.00 cm/s AV Vmean:          159.000 cm/s AV VTI:            0.438 m AV Peak Grad:      19.7 mmHg AV Mean Grad:      12.0 mmHg LVOT Vmax:         159.00 cm/s LVOT Vmean:        111.000 cm/s LVOT VTI:          0.314 m LVOT/AV VTI ratio: 0.72  AORTA Ao Root diam: 3.20 cm MITRAL VALVE MV Area (PHT): 2.83 cm     SHUNTS MV Decel Time: 268 msec     Systemic VTI:  0.31 m MV E velocity: 132.00 cm/s  Systemic Diam: 1.90 cm MV A velocity: 163.00 cm/s MV E/A ratio:  0.81 Rozann Lesches MD Electronically signed by Rozann Lesches MD Signature Date/Time: 04/25/2022/10:50:55 AM    Final    CT ABDOMEN PELVIS W CONTRAST  Result Date: 04/24/2022 CLINICAL DATA:  Abdominal pain, acute, nonlocalized EXAM: CT ABDOMEN AND PELVIS WITH CONTRAST TECHNIQUE: Multidetector CT imaging of the abdomen and pelvis was performed using the standard protocol following bolus administration of intravenous contrast. RADIATION DOSE REDUCTION: This exam was performed according to the departmental dose-optimization program which includes automated exposure control, adjustment  of the mA and/or kV according to patient size and/or use of iterative reconstruction technique. CONTRAST:  55m OMNIPAQUE IOHEXOL 300 MG/ML  SOLN COMPARISON:  09/27/2017 FINDINGS: Lower chest: Small bilateral pleural effusions, right greater than left. Airspace opacity in the right lower lobe. Cannot exclude pneumonia. Hepatobiliary: Nodular, shrunken liver compatible with cirrhosis. Gallbladder unremarkable. No focal hepatic abnormality. Pancreas: No focal abnormality or ductal dilatation. Spleen: No focal abnormality.  Normal size. Adrenals/Urinary Tract: Urinary bladder decompressed with Foley catheter in place. No stones or hydronephrosis. No renal or adrenal mass. Stomach/Bowel: Gaseous distension of the colon with moderate liquid stool. Stomach and small bowel decompressed. Vascular/Lymphatic: Scattered aortic calcifications. No evidence of aneurysm or adenopathy. Reproductive: Uterus and adnexa unremarkable.  No mass. Other: No free fluid or free air. Musculoskeletal: No acute bony abnormality. IMPRESSION: Changes of cirrhosis. Gaseous distention of the colon with liquid stool. This may reflect ileus. Small bilateral pleural effusions, right greater than left. Right lower lobe airspace opacity could reflect pneumonia. Scattered aortic atherosclerosis. Electronically Signed   By: KRolm BaptiseM.D.   On: 04/24/2022 21:32   DG Abd Portable 1V  Result Date: 04/24/2022 CLINICAL DATA:  Abdominal pain EXAM: PORTABLE ABDOMEN - 1 VIEW COMPARISON:  MRI abdomen 01/07/2022 FINDINGS: Large and small bowel is diffusely distended with air. Both colon and small bowel loops are borderline dilated. Air seen to the level the rectum. No suspicious calcifications are seen. No acute fractures are identified. There is a small right  pleural effusion. There are degenerative changes of both hips. IMPRESSION: Diffuse gaseous distension of large and small bowel loops. Air seen to the level of the rectum. Findings may represent  ileus. Consider further evaluation with CT of the abdomen and pelvis with contrast. Electronically Signed   By: Ronney Asters M.D.   On: 04/24/2022 19:37      Meghana Tullo T. Avalon  If 7PM-7AM, please contact night-coverage [email or web address] 04/25/2022, 12:20 PM

## 2022-04-25 NOTE — Progress Notes (Signed)
Dr. Cyndia Skeeters at bedside. MD aware of patient's soft blood pressures with systolic in 72I to low 203T. MD stated he will place orders for bolus and midodrine and to continue Cardizem drip for now.

## 2022-04-25 NOTE — Consult Note (Signed)
Palliative Care Consult Note                                  Date: 04/25/2022   Patient Name: Yolanda [name]  DOB: 07/01/1956  MRN: [id number]  Age / Sex: 66 y.o., female  PCP: Celene Squibb, MD Referring Physician: Mercy Riding, MD  Reason for Consultation: {Reason for Consult:23484}  HPI/Patient Profile: 66 y.o. female  with past medical history of *** admitted on 04/19/2022 with ***.   Past Medical History:  Diagnosis Date   Abnormal uterine bleeding    Anxiety    Arthritis    Chronic abdominal pain    Chronic pain in left foot    Depression    Elevated liver function tests 2018   Endometriosis    Fibromyalgia    GERD 12/21/2009   Qualifier: Diagnosis of  By: Craige Cotta     Hypertension    stopped meds in Aug 2015   Hypothyroidism    IBS (irritable bowel syndrome)    Internal hemorrhoids    Kidney stone    Liver cirrhosis secondary to NASH (HCC)    Obesity, morbid (HCC)    PONV (postoperative nausea and vomiting)    Psoriatic arthritis ([place])    Pulmonary embolism ([place]) 12/2005   Qualifier: Diagnosis of  By: Kellie Simmering LPN, Almyra Free     Rheumatoid arthritis [facility])    Sleep apnea    Vitamin D deficiency     Subjective:   This NP Walden Field reviewed medical records, received report from team, assessed the patient and then meet at the patient's bedside to discuss diagnosis, prognosis, GOC, EOL wishes disposition and options.  I met with ***.   Concept of Palliative Care was introduced as specialized medical care for people and their families living with serious illness.  If focuses on providing relief from the symptoms and stress of a serious illness.  The goal is to improve quality of life for both the patient and the family. Values and goals of care important to patient and family were attempted to be elicited.  Created space and opportunity for patient  and family to explore thoughts and feelings regarding current  medical situation   Natural trajectory and current clinical status were discussed. Questions and concerns addressed. Patient  encouraged to call with questions or concerns.    Patient/Family Understanding of Illness: ***  Life Review: ***  Patient Values: ***  Goals: ***  Today's Discussion: ***  Review of Systems  Objective:   Primary Diagnoses: Present on Admission:  Acute metabolic encephalopathy  Hypothyroidism  UTI (urinary tract infection)  Asthma, chronic  Chronic respiratory failure with hypoxia (HCC)  Essential hypertension  Mixed hyperlipidemia  Hyperammonemia /Hepatic encephalopathy   Physical Exam  Vital Signs:  BP (!) 173/88   Pulse 94   Temp 97.7 F (36.5 C) (Oral)   Resp 19   Ht 5' 4"  (1.626 m)   Wt 113.1 kg   LMP 07/11/2007 (Approximate)   SpO2 97%   BMI 42.80 kg/m   Palliative Assessment/Data: ***    Advanced Care Planning:   Primary Decision Maker: {Primary Decision XMIWO:03212}  Code Status/Advance Care Planning: {Palliative Code status:23503}  A discussion was had today regarding advanced directives. Concepts specific to code status, artifical feeding and hydration, continued IV antibiotics and rehospitalization was had.  The difference between a aggressive medical intervention path and a palliative comfort  care path for this patient at this time was had. ***The MOST form was introduced and discussed.***  Decisions/Changes to ACP: ***  Assessment & Plan:   Impression: ***  SUMMARY OF RECOMMENDATIONS   ***  Symptom Management:  ***  Prognosis:  {Palliative Care Prognosis:23504}  Discharge Planning:  {Palliative dispostion:23505}   Discussed with: ***    Thank you for allowing [place] to participate in the care of Larena Sox PMT will continue to support holistically.  Time Total: ***  Greater than 50%  of this time was spent counseling and coordinating care related to the above assessment and  plan.  Signed by: Walden Field, NP Palliative Medicine Team  Team Phone # [phone] (Nights/Weekends)  04/25/2022, 2:43 PM

## 2022-04-25 NOTE — Progress Notes (Signed)
Reassessed patient. Patient is sleeping and wakes to voice easily.  Heart rate improved to 110s-120s.  SBP in 100s.  DBP in 60s to 70s.  Saturating in mid 90s on home 3 L.  Echocardiogram hyperdynamic with G1 DD.  No nausea, vomiting or abdominal pain.    We will continue Cardizem drip and titrate for heart rate.  Cardiology consulted earlier, and will see patient in the morning as long as HR continues to improve.  Patient's daughter, Yolanda [name] at bedside and updated on the plan.

## 2022-04-25 NOTE — Progress Notes (Addendum)
Upon entrance to the room patient tearful, yelling out in pain, complaining on lower abdominal pain that radiated on one side to the other. Patient stated the pain was a sharp cramping pain. Per abdominal xray there was suspicion for ileus. Nighttime hospitalist Dr.Zierle-Ghosh was contacted. Orders for a follow up abdominal CT with contrast and 10mg of fentanyl for pain were placed. CT of her abdomen showed: Gaseous distention of the colon with liquid stool. This may reflect ileus. Dr.Zierle-Ghosh ordered a rectal tube and changed the patient's lactulose to rectal. After placement of rectal tube and lactulose enema patient began to pass liquid stool.   Around Midnight patient began to yell out in pain and began throwing up a small amount of clear liquid. Zofran was given and physician was notified another one time dose of fentanyl 238m was ordered. Additionally, a one time dose of Mylicon was ordered and given to patient.  Patient is resting comfortably at this time.

## 2022-04-26 DIAGNOSIS — Z515 Encounter for palliative care: Secondary | ICD-10-CM | POA: Diagnosis not present

## 2022-04-26 DIAGNOSIS — I4891 Unspecified atrial fibrillation: Secondary | ICD-10-CM

## 2022-04-26 DIAGNOSIS — Z8719 Personal history of other diseases of the digestive system: Secondary | ICD-10-CM

## 2022-04-26 DIAGNOSIS — R4182 Altered mental status, unspecified: Secondary | ICD-10-CM

## 2022-04-26 DIAGNOSIS — G4733 Obstructive sleep apnea (adult) (pediatric): Secondary | ICD-10-CM

## 2022-04-26 DIAGNOSIS — G4730 Sleep apnea, unspecified: Secondary | ICD-10-CM

## 2022-04-26 DIAGNOSIS — E8809 Other disorders of plasma-protein metabolism, not elsewhere classified: Secondary | ICD-10-CM

## 2022-04-26 DIAGNOSIS — G9341 Metabolic encephalopathy: Secondary | ICD-10-CM | POA: Diagnosis not present

## 2022-04-26 DIAGNOSIS — Z7189 Other specified counseling: Secondary | ICD-10-CM | POA: Diagnosis not present

## 2022-04-26 LAB — CBC
HCT: 31.2 % — ABNORMAL LOW (ref 36.0–46.0)
Hemoglobin: 10 g/dL — ABNORMAL LOW (ref 12.0–15.0)
MCH: 31.7 pg (ref 26.0–34.0)
MCHC: 32.1 g/dL (ref 30.0–36.0)
MCV: 99 fL (ref 80.0–100.0)
Platelets: 205 10*3/uL (ref 150–400)
RBC: 3.15 MIL/uL — ABNORMAL LOW (ref 3.87–5.11)
RDW: 15.9 % — ABNORMAL HIGH (ref 11.5–15.5)
WBC: 12.3 10*3/uL — ABNORMAL HIGH (ref 4.0–10.5)
nRBC: 0 % (ref 0.0–0.2)

## 2022-04-26 LAB — RENAL FUNCTION PANEL
Albumin: 2.5 g/dL — ABNORMAL LOW (ref 3.5–5.0)
Anion gap: 1 — ABNORMAL LOW (ref 5–15)
BUN: 25 mg/dL — ABNORMAL HIGH (ref 8–23)
CO2: 25 mmol/L (ref 22–32)
Calcium: 9.4 mg/dL (ref 8.9–10.3)
Chloride: 110 mmol/L (ref 98–111)
Creatinine, Ser: 0.65 mg/dL (ref 0.44–1.00)
GFR, Estimated: >60 mL/min (ref >60)
Glucose, Bld: 92 mg/dL (ref 70–99)
Phosphorus: 3.1 mg/dL (ref 2.5–4.6)
Potassium: 4.3 mmol/L (ref 3.5–5.1)
Sodium: 136 mmol/L (ref 135–145)

## 2022-04-26 LAB — PROTEIN ELECTROPHORESIS, SERUM
A/G Ratio: 0.4 — ABNORMAL LOW (ref 0.7–1.7)
Albumin ELP: 2.8 g/dL — ABNORMAL LOW (ref 2.9–4.4)
Alpha-1-Globulin: 0.2 g/dL (ref 0.0–0.4)
Alpha-2-Globulin: 0.3 g/dL — ABNORMAL LOW (ref 0.4–1.0)
Beta Globulin: 0.9 g/dL (ref 0.7–1.3)
Gamma Globulin: 5.4 g/dL — ABNORMAL HIGH (ref 0.4–1.8)
Globulin, Total: 6.8 g/dL — ABNORMAL HIGH (ref 2.2–3.9)
M-Spike, %: 2.7 g/dL — ABNORMAL HIGH
Total Protein ELP: 9.6 g/dL — ABNORMAL HIGH (ref 6.0–8.5)

## 2022-04-26 LAB — MAGNESIUM: Magnesium: 2.1 mg/dL (ref 1.7–2.4)

## 2022-04-26 LAB — PROTIME-INR
INR: 1.7 — ABNORMAL HIGH (ref 0.8–1.2)
Prothrombin Time: 19.5 seconds — ABNORMAL HIGH (ref 11.4–15.2)

## 2022-04-26 LAB — AMMONIA: Ammonia: 40 umol/L — ABNORMAL HIGH (ref 9–35)

## 2022-04-26 MED ORDER — LACTULOSE 10 GM/15ML PO SOLN
30.0000 g | Freq: Three times a day (TID) | ORAL | Status: DC
  Administered 2022-04-26 – 2022-04-27 (×2): 30 g via ORAL
  Filled 2022-04-26 (×2): qty 60

## 2022-04-26 MED ORDER — PHYTONADIONE 5 MG PO TABS
5.0000 mg | ORAL_TABLET | Freq: Once | ORAL | Status: AC
  Administered 2022-04-26: 5 mg via ORAL
  Filled 2022-04-26: qty 1

## 2022-04-26 NOTE — Progress Notes (Signed)
Pt able to get up with PT and ambulate down hall, approximately 50 ft. 85% on room air, PT placed pt back on 3L [place] and oxygen saturation back up to 92%. No complaints. Pt sitting in recliner at this time.

## 2022-04-26 NOTE — Evaluation (Signed)
Occupational Therapy Evaluation Patient Details Name: Yolanda [name] MRN: [id number] DOB: [birth year] Today's Date: 04/26/2022   History of Present Illness Yolanda [name] is a 66 y.o. female with medical history significant of anxiety, depression, GERD, hypertension, hypothyroidism, liver cirrhosis secondary to [place], obesity, pulmonary embolism, sleep apnea, and more presents the ED with chief complaint of difficulty breathing and disoriented.  Patient reports that her daughter says her symptoms have been gradually progressing over 2 weeks.  Patient noticed an acute change this morning upon waking.  She remembers being confused.  She reports the orthopnea is normal for her.  She continues to have to prop himself up at night with otherwise having shortness of breath.  She denies any new cough or fever.  Patient reports that she has had some chest pain from right to left.  It is sharp when she is across her chest.  Today it was mostly on the right side.  It is only there for a few seconds at a time.  Patient reports that she feels dizzy.  She had some nausea but no vomiting.  She reports that she felt generally ill and washed out yesterday.     Patient reports that she supposed to take her lactulose twice a day but she has been taking his once a day.  Apparently when the daughter was here she was reporting to staff that 7 days patient is needing to take her lactulose.  Patient reports that she cannot tell us when her last bowel was because she cannot remember.   Clinical Impression   Pt agreeable to OT and PT co-evaluation. Pt donning 3 L supplemental O2 throughout session today. Pt demonstrates need for partial assist for supine to sit bed mobility with HOB raised. Pt is able to ambulate in room and hall with min G assist but noted desaturation to 74% SpO2 and increase in heart rate to ~150 BPM at one point. Pt able to return to 89 to 90% SpO2 once seated for a few minutes. Pt requires assist for  lower body dressing or a sock aide at baseline. Pt is mostly limited by SOB and heart rate issues at this time. Pt was left in chair with call bell within reach. Pt will benefit from continued OT in the hospital and recommended venue below to increase strength, balance, and endurance for safe ADL's.         Recommendations for follow up therapy are one component of a multi-disciplinary discharge planning process, led by the attending physician.  Recommendations may be updated based on patient status, additional functional criteria and insurance authorization.   Follow Up Recommendations  Home health OT    Assistance Recommended at Discharge Intermittent Supervision/Assistance  Patient can return home with the following A little help with bathing/dressing/bathroom;A little help with walking and/or transfers;Assistance with cooking/housework;Assist for transportation;Help with stairs or ramp for entrance    Functional Status Assessment  Patient has had a recent decline in their functional status and demonstrates the ability to make significant improvements in function in a reasonable and predictable amount of time.  Equipment Recommendations  None recommended by OT    Recommendations for Other Services       Precautions / Restrictions Precautions Precautions: Fall Restrictions Weight Bearing Restrictions: No      Mobility Bed Mobility Overal bed mobility: Needs Assistance Bed Mobility: Supine to Sit     Supine to sit: Min guard, Min assist, HOB elevated     General bed mobility comments:  seated EOB    Transfers Overall transfer level: Needs assistance Equipment used: Rolling walker (2 wheels) Transfers: Sit to/from Stand, Bed to chair/wheelchair/BSC Sit to Stand: Min guard     Step pivot transfers: Min guard     General transfer comment: Mild labored movement using RW      Balance Overall balance assessment: Needs assistance Sitting-balance support: Feet  supported, Bilateral upper extremity supported Sitting balance-Leahy Scale: Good Sitting balance - Comments: seated at EOB   Standing balance support: During functional activity, No upper extremity supported Standing balance-Leahy Scale: Fair Standing balance comment: using RW                           ADL either performed or assessed with clinical judgement   ADL Overall ADL's : Needs assistance/impaired     Grooming: Standing;Min guard;Minimal assistance   Upper Body Bathing: Set up;Sitting   Lower Body Bathing: Maximal assistance;Moderate assistance;Sitting/lateral leans   Upper Body Dressing : Set up;Sitting   Lower Body Dressing: Maximal assistance;Sitting/lateral leans Lower Body Dressing Details (indicate cue type and reason): Max A today and at baseline if pt does not have sock aide. Pt reports not usually wearing socks. Toilet Transfer: Min guard;Stand-pivot;Rolling walker (2 wheels) Toilet Transfer Details (indicate cue type and reason): Simulated via EOB to chair transfer. Toileting- Clothing Manipulation and Hygiene: Min guard;Sitting/lateral lean;Minimal assistance       Functional mobility during ADLs: Min guard;Rolling walker (2 wheels) General ADL Comments: Pt noted to desaturate during ambulation with increase in heart rate.     Vision Baseline Vision/History: 1 Wears glasses Ability to See in Adequate Light: 0 Adequate Patient Visual Report: No change from baseline Vision Assessment?: No apparent visual deficits     Perception     Praxis      Pertinent Vitals/Pain Pain Assessment Pain Assessment: No/denies pain     Hand Dominance Right   Extremity/Trunk Assessment Upper Extremity Assessment Upper Extremity Assessment: Overall WFL for tasks assessed   Lower Extremity Assessment Lower Extremity Assessment: Defer to PT evaluation   Cervical / Trunk Assessment Cervical / Trunk Assessment: Normal   Communication  Communication Communication: No difficulties   Cognition Arousal/Alertness: Awake/alert Behavior During Therapy: WFL for tasks assessed/performed Overall Cognitive Status: Within Functional Limits for tasks assessed                                                        Home Living Family/patient expects to be discharged to:: Private residence Living Arrangements: Children;Other relatives Available Help at Discharge: Family;Available 24 hours/day Type of Home: House Home Access: Stairs to enter [employer] of Steps: 2 Entrance Stairs-Rails: None Home Layout: Two level;Laundry or work area in basement;Able to live on main level with bedroom/bathroom Alternate Therapist, sports of Steps: 12 to 15 Alternate Level Stairs-Rails: Right Bathroom Shower/Tub: Teacher, early years/pre: Handicapped height Bathroom Accessibility: No   Home Equipment: Rollator (4 wheels);Cane - single point;Shower seat;Grab bars - tub/shower   Additional Comments: Pt reported having a BSC today.      Prior Functioning/Environment Prior Level of Function : Needs assist       Physical Assist : Mobility (physical);ADLs (physical) Mobility (physical): Gait;Transfers;Stairs ADLs (physical): IADLs;Dressing Mobility Comments: household ambulator using Rollator ADLs Comments: Assited to don  socks PRN; uses sock aide as well. Independent for other ADL's per report.        OT Problem List: Decreased strength;Decreased activity tolerance;Decreased range of motion;Impaired balance (sitting and/or standing)      OT Treatment/Interventions: Self-care/ADL training;Therapeutic exercise;Therapeutic activities;Energy conservation;Patient/family education;Balance training    OT Goals(Current goals can be found in the care plan section) Acute Rehab OT Goals Patient Stated Goal: return home OT Goal Formulation: With patient Time For Goal Achievement: 05/10/22 Potential  to Achieve Goals: Good  OT Frequency: Min 1X/week    Co-evaluation PT/OT/SLP Co-Evaluation/Treatment: Yes Reason for Co-Treatment: To address functional/ADL transfers   OT goals addressed during session: ADL's and self-care                       End of Session Equipment Utilized During Treatment: Rolling walker (2 wheels);Oxygen  Activity Tolerance: Patient tolerated treatment well Patient left: in chair;with call bell/phone within reach;with family/visitor present  OT Visit Diagnosis: Unsteadiness on feet (R26.81);Other abnormalities of gait and mobility (R26.89);Muscle weakness (generalized) (M62.81)                Time: 7639-4320 OT Time Calculation (min): 16 min Charges:  OT General Charges $OT Visit: 1 Visit OT Evaluation $OT Eval Low Complexity: 1 Low  Annabel Gibeau OT, MOT  Larey Seat 04/26/2022, 10:34 AM

## 2022-04-26 NOTE — Progress Notes (Addendum)
Physical Therapy Treatment Patient Details Name: Yolanda [name] MRN: [id number] DOB: 04/20/1956 Today's Date: 04/26/2022   History of Present Illness Yolanda [name] is a 66 y.o. female with medical history significant of anxiety, depression, GERD, hypertension, hypothyroidism, liver cirrhosis secondary to [place], obesity, pulmonary embolism, sleep apnea, and more presents the ED with chief complaint of difficulty breathing and disoriented.  Patient reports that her daughter says her symptoms have been gradually progressing over 2 weeks.  Patient noticed an acute change this morning upon waking.  She remembers being confused.  She reports the orthopnea is normal for her.  She continues to have to prop himself up at night with otherwise having shortness of breath.  She denies any new cough or fever.  Patient reports that she has had some chest pain from right to left.  It is sharp when she is across her chest.  Today it was mostly on the right side.  It is only there for a few seconds at a time.  Patient reports that she feels dizzy.  She had some nausea but no vomiting.  She reports that she felt generally ill and washed out yesterday.     Patient reports that she supposed to take her lactulose twice a day but she has been taking his once a day.  Apparently when the daughter was here she was reporting to staff that 7 days patient is needing to take her lactulose.  Patient reports that she cannot tell us when her last bowel was because she cannot remember.    PT Comments    REASSESSMENT: Patient demonstrates slightly labored movement for sitting up at bedside. Patient able to transfer to chair and ambulate in hallway with RW/min guard assist with no loss of balance. During ambulation, patient's heart rate increased from 98 bpm to low 120s but returned to 100 bpm after resting in chair. Patient kept on 3 LPM of O2 due to SpO2 decreasing from 92% to 85% during ambulation with SpO2 returning to 92% at  rest. Patient tolerated sitting up in chair after therapy. Patient will benefit from continued skilled physical therapy in hospital and recommended venue below to increase strength, balance, endurance for safe ADLs and gait.   Recommendations for follow up therapy are one component of a multi-disciplinary discharge planning process, led by the attending physician.  Recommendations may be updated based on patient status, additional functional criteria and insurance authorization.  Follow Up Recommendations  Home health PT     Assistance Recommended at Discharge Set up Supervision/Assistance  Patient can return home with the following A little help with walking and/or transfers;A little help with bathing/dressing/bathroom;Help with stairs or ramp for entrance;Assistance with cooking/housework   Equipment Recommendations  None recommended by PT    Recommendations for Other Services       Precautions / Restrictions Precautions Precautions: Fall Restrictions Weight Bearing Restrictions: No     Mobility  Bed Mobility Overal bed mobility: Needs Assistance Bed Mobility: Supine to Sit     Supine to sit: HOB elevated, Min assist     General bed mobility comments: patient required min/hand held assist for elevating trunk, able to scoot to EOB without assist    Transfers Overall transfer level: Needs assistance Equipment used: Rolling walker (2 wheels) Transfers: Sit to/from Stand, Bed to chair/wheelchair/BSC Sit to Stand: Min guard   Step pivot transfers: Min guard       General transfer comment: slightly labored movement requiring use of RW  Ambulation/Gait Ambulation/Gait assistance: Min guard, Supervision Gait Distance (Feet): 45 Feet Assistive device: Rolling walker (2 wheels) Gait Pattern/deviations: Decreased step length - right, Decreased step length - left, Decreased stride length Gait velocity: decreased     General Gait Details: slightly labored cadence, use  of RW, no loss of balance   Stairs             Wheelchair Mobility    Modified Rankin (Stroke Patients Only)       Balance Overall balance assessment: Needs assistance Sitting-balance support: Feet supported, Bilateral upper extremity supported Sitting balance-Leahy Scale: Normal Sitting balance - Comments: seated at EOB   Standing balance support: During functional activity, Bilateral upper extremity supported, Reliant on assistive device for balance Standing balance-Leahy Scale: Fair Standing balance comment: use of RW                            Cognition Arousal/Alertness: Awake/alert Behavior During Therapy: WFL for tasks assessed/performed Overall Cognitive Status: Within Functional Limits for tasks assessed                                          Exercises      General Comments        Pertinent Vitals/Pain Pain Assessment Pain Assessment: No/denies pain    Home Living Family/patient expects to be discharged to:: Private residence Living Arrangements: Children;Other relatives Available Help at Discharge: Family;Available 24 hours/day Type of Home: House Home Access: Stairs to enter Entrance Stairs-Rails: None Entrance Stairs-Number of Steps: 2 Alternate Level Stairs-Number of Steps: 12 to 15 Home Layout: Two level;Laundry or work area in basement;Able to live on main level with bedroom/bathroom Home Equipment: [place] (4 wheels);Cane - single point;Shower seat;Grab bars - tub/shower Additional Comments: Pt reported having a BSC today.    Prior Function            PT Goals (current goals can now be found in the care plan section) Acute Rehab PT Goals Patient Stated Goal: return home with family to assist PT Goal Formulation: With patient Time For Goal Achievement: 05/03/22 Potential to Achieve Goals: Good Progress towards PT goals: Progressing toward goals    Frequency    Min 3X/week      PT Plan  Discharge plan needs to be updated    Co-evaluation PT/OT/SLP Co-Evaluation/Treatment: Yes Reason for Co-Treatment: To address functional/ADL transfers PT goals addressed during session: Mobility/safety with mobility;Balance;Proper use of DME OT goals addressed during session: ADL's and self-care      AM-PAC PT "6 Clicks" Mobility   Outcome Measure  Help needed turning from your back to your side while in a flat bed without using bedrails?: None Help needed moving from lying on your back to sitting on the side of a flat bed without using bedrails?: A Little Help needed moving to and from a bed to a chair (including a wheelchair)?: A Little Help needed standing up from a chair using your arms (e.g., wheelchair or bedside chair)?: A Little Help needed to walk in hospital room?: A Little Help needed climbing 3-5 steps with a railing? : A Little 6 Click Score: 19    End of Session Equipment Utilized During Treatment: Oxygen Activity Tolerance: Patient tolerated treatment well;Patient limited by fatigue Patient left: with call bell/phone within reach;in chair;with family/visitor present Nurse Communication: Mobility status PT Visit  Diagnosis: Unsteadiness on feet (R26.81);Muscle weakness (generalized) (M62.81);Other abnormalities of gait and mobility (R26.89)     Time: 2952-8413 PT Time Calculation (min) (ACUTE ONLY): 20 min  Charges:  $Therapeutic Activity: 8-22 mins                     Zigmund Gottron, SPT

## 2022-04-26 NOTE — Significant Event (Signed)
Patient converted back into normal sinus rhythm at 2225. Strip printed and placed in chart. Adefeso DO notified.

## 2022-04-26 NOTE — Consult Note (Addendum)
CARDIOLOGY CONSULT NOTE    Patient ID: Yolanda [name]; [id number]; Jan 11, 1956   Admit date: 04/19/2022 Date of Consult: 04/26/2022  Primary Care Provider: Celene Squibb, MD Primary Cardiologist: None Primary Electrophysiologist:  None   Patient Profile:   Yolanda [name] is a 66 y.o. female with a hx of OSA,  who is being seen today for the evaluation of atrial fibrillation new onset at the request of Dr. Denton Brick.  History of Present Illness:   Yolanda [name] is a 66 year old female known to have liver cirrhosis likely secondary to [facility], psoriasis, RA, OSA not on CPAP was brought to the ER by her family due to altered mental status.  She forgot to take lactulose after which she started to have confusion, mood changes, and eventually went mental status for which she was brought to the ER for evaluation. She was admitted to the ICU for the management of hepatoencephalopathy. Hospital stay is complicated by new onset of atrial fibrillation with RVR for which cardiology was consulted. Cardizem drip was started at 10 mg/h after her rates are partially well controlled.  On IV Lasix.  Patient reported that she had dizziness/lightheadedness at home but attributed that mostly to vertigo.  She is on meclizine tablets.  Denied any angina, syncope, palpitations lower extremity swelling.  Per chart review, patient follows with Dr. Halford Chessman pulmonary medicine as outpatient.  She has an upcoming appointment for lab titration study, to see whether she may benefit from CPAP versus BiPAP. This is new onset of atrial fibrillation.  No prior history of stroke.  No family history of ASCVD.  Although her father and brother had PCI in 15 to 37s.  Denies smoking cigarettes, illicit drug use, alcohol use.  No prior ischemic evaluation   Past Medical History:  Diagnosis Date   Abnormal uterine bleeding    Anxiety    Arthritis    Chronic abdominal pain    Chronic pain in left foot    Depression    Elevated  liver function tests 2018   Endometriosis    Fibromyalgia    GERD 12/21/2009   Qualifier: Diagnosis of  By: Craige Cotta     Hypertension    stopped meds in Aug 2015   Hypothyroidism    IBS (irritable bowel syndrome)    Internal hemorrhoids    Kidney stone    Liver cirrhosis secondary to NASH (HCC)    Obesity, morbid (HCC)    PONV (postoperative nausea and vomiting)    Psoriatic arthritis ([place])    Pulmonary embolism ([place]) 12/2005   Qualifier: Diagnosis of  By: Kellie Simmering LPN, Almyra Free     Rheumatoid arthritis [facility])    Sleep apnea    Vitamin D deficiency     Past Surgical History:  Procedure Laterality Date   ABDOMINAL SURGERY     laparoscopy   BIOPSY N/A 03/24/2014   Procedure: GASTRIC BIOPSIES;  Surgeon: Danie Binder, MD;  Location: [facility];  Service: Endoscopy;  Laterality: N/A;   BIOPSY  10/28/2019   Procedure: BIOPSY;  Surgeon: Danie Binder, MD;  Location: [facility] SU[facility];  Service: Endoscopy;;  gastric nodule   BREAST LUMPECTOMY Right    CESAREAN SECTION     X2   COLONOSCOPY  2006   internal hemorrhoids   COLONOSCOPY WITH PROPOFOL N/A 03/24/2014   Dr. [name]: 2 tubular adenomas removed, hemorrhoids   COLONOSCOPY WITH PROPOFOL N/A 10/28/2019   Fields: External and internal hemorrhoids, 8 polyps ranging from 2  to 5 mm in size removed from the colon.  Multiple tubular adenomas.  Next colonoscopy in 3 years.   CYSTOSCOPY W/ RETROGRADES  01/23/2012   Procedure: CYSTOSCOPY WITH RETROGRADE PYELOGRAM;  Surgeon: Marissa Nestle, MD;  Location: [facility];  Service: Urology;  Laterality: Left;   DILATATION & CURETTAGE/HYSTEROSCOPY WITH MYOSURE N/A 11/18/2014   Procedure: DILATATION & CURETTAGE/HYSTEROSCOPY WITH MYOSURE, resection of polyp;  Surgeon: Cheri Fowler, MD;  Location: [place] [facility];  Service: Gynecology;  Laterality: N/A;   DILATION AND CURETTAGE OF UTERUS     ESOPHAGOGASTRODUODENOSCOPY   08/24/2006   Dr. Veto Kemps erythema of the antrum without erosion or ulcers/Otherwise,  normal esophagus without evidence of Barrett's path with chronic gastritis   ESOPHAGOGASTRODUODENOSCOPY (EGD) WITH PROPOFOL N/A 03/24/2014   Dr. [name]: gastritis   ESOPHAGOGASTRODUODENOSCOPY (EGD) WITH PROPOFOL N/A 10/28/2019   Fields: Esophagus appeared normal, empiric dilation due to history of dysphagia.  6 mm nodule seen in the gastric cardia.  Mild portal hypertensive gastropathy.  Nodule from the stomach biopsied and showed mild chronic gastritis, no H. pylori.   POLYPECTOMY N/A 03/24/2014   Procedure: POLYPECTOMY;  Surgeon: Danie Binder, MD;  Location: [facility];  Service: Endoscopy;  Laterality: N/A;   POLYPECTOMY  10/28/2019   Procedure: POLYPECTOMY;  Surgeon: Danie Binder, MD;  [facility];  Service: Endoscopy;;  hepatic flexure, ascending colon,sigmoid colon, rectal   REMOVAL OF STONES  01/23/2012   Procedure: REMOVAL OF STONES;  Surgeon: Marissa Nestle, MD;  Location: [facility];  Service: Urology;  Laterality: N/A;   SAVORY DILATION N/A 10/28/2019   Procedure: SAVORY DILATION;  Surgeon: Danie Binder, MD;  [facility];  Service: Endoscopy;  Laterality: N/A;   TUBAL LIGATION       Home Medications:  Prior to Admission medications   Medication Sig Start Date End Date Taking? Authorizing Provider  albuterol (VENTOLIN HFA) 108 (90 Base) MCG/ACT inhaler Inhale 1-2 puffs into the lungs every 6 (six) hours as needed for wheezing or shortness of breath. 11/08/19  Yes Long, Wonda Olds, MD  aspirin EC 81 MG tablet Take 81 mg by mouth daily. Swallow whole.   Yes [provider]  budesonide-formoterol (SYMBICORT) 80-4.5 MCG/ACT inhaler Take 2 puffs first thing in am and then another 2 puffs about 12 hours later. 11/25/21  Yes Tanda Rockers, MD  Calcium Carb-Cholecalciferol (CALCIUM 600 + D PO) Take by mouth.   Yes [provider]  Cholecalciferol (VITAMIN D3) 2000 units TABS Take 2,000 Units by mouth daily.    Yes [provider]  clotrimazole  (MYCELEX) 10 MG troche Take 10 mg by mouth 5 (five) times daily.   Yes [provider]  DULoxetine (CYMBALTA) 60 MG capsule Take 60 mg by mouth daily. 07/15/21  Yes [provider]  inFLIXimab (REMICADE IV) Inject into the vein. Q three months per patient.   Yes [provider]  lactulose, encephalopathy, (CHRONULAC) 10 GM/15ML SOLN Take 30 mLs (20 g total) by mouth daily. Ok to take additional dose if No BM in 24 hrs 03/29/22  Yes Carver, Elon Alas, DO  levothyroxine (SYNTHROID) 75 MCG tablet Take 1 tablet by mouth daily. 12/26/21  Yes [provider]  meclizine (ANTIVERT) 25 MG tablet Take 25 mg by mouth 3 (three) times daily as needed for dizziness.   Yes [provider]  Melatonin 10 MG CAPS Take 10 mg by mouth at bedtime as needed (sleep).   Yes [provider]  metFORMIN (GLUCOPHAGE-XR) 500 MG 24 hr tablet 1 po with supper x 1 week, then 1 po BID WC x 1 week, then 2 po BID WC Patient taking differently: Take 500 mg by mouth daily as needed (sugar). 09/22/21  Yes Johnson, Clanford L, MD  olmesartan-hydrochlorothiazide (BENICAR HCT) 40-12.5 MG tablet Take 1 tablet by mouth daily. 12/07/21  Yes Emokpae, Courage, MD  omeprazole (PRILOSEC) 20 MG capsule Take 1 capsule (20 mg total) by mouth 2 (two) times daily before a meal. 1 po 30 mins prior to [place]. Patient taking differently: Take 20 mg by mouth 2 (two) times daily as needed (gerd). 03/09/21 04/19/22 Yes Carver, Charles K, DO  pantoprazole (PROTONIX) 40 MG tablet Take 40 mg by mouth 2 (two) times daily.   Yes [provider]  pilocarpine (SALAGEN) 5 MG tablet Take 5 mg by mouth 2 (two) times daily.   Yes [provider]  rifaximin (XIFAXAN) 550 MG TABS tablet Take 550 mg by mouth 2 (two) times daily.   Yes [provider]  rosuvastatin (CRESTOR) 10 MG tablet Take 10 mg by mouth daily.   Yes [provider]  spironolactone (ALDACTONE) 50 MG tablet Take 1  tablet (50 mg total) by mouth daily. 03/23/22 03/23/23 Yes Carver, Elon Alas, DO  sucralfate (CARAFATE) 1 g tablet Take 1 g by mouth 4 (four) times daily. 01/25/22  Yes [provider]  blood glucose meter kit and supplies Dispense based on patient and insurance preference. Use up to four times daily as directed. (FOR ICD-10 E10.9, E11.9). 09/22/21   Johnson, Clanford L, MD  HYDROcodone-acetaminophen (NORCO/VICODIN) 5-325 MG tablet Take 1 tablet by mouth every 4 (four) hours as needed for moderate pain. Patient not taking: Reported on 03/23/2022 02/22/22 02/22/23  Fransico Meadow, PA-C    Inpatient Medications: Scheduled Meds:  aspirin EC  81 mg Oral Daily   Chlorhexidine Gluconate Cloth  6 each Topical Daily   DULoxetine  60 mg Oral Daily   heparin  5,000 Units Subcutaneous Q8H   lactulose  300 mL Rectal BID   levothyroxine  75 mcg Oral Q0600   metoCLOPramide (REGLAN) injection  5 mg Intravenous TID AC & HS   metoprolol tartrate  12.5 mg Oral BID   midodrine  5 mg Oral BID WC   mometasone-formoterol  2 puff Inhalation BID   multivitamin with minerals  1 tablet Oral Daily   pantoprazole  40 mg Oral Daily   Ensure Max Protein  11 oz Oral BID   Continuous Infusions:  ceFEPime (MAXIPIME) IV 2 g (04/26/22 0431)   diltiazem (CARDIZEM) infusion 10 mg/hr (04/26/22 0231)   doxycycline (VIBRAMYCIN) IV 100 mg (04/26/22 0733)   PRN Meds: acetaminophen **OR** acetaminophen, ipratropium-albuterol, LORazepam, ondansetron **OR** ondansetron (ZOFRAN) IV  Allergies:    Allergies  Allergen Reactions   Azithromycin Hives   Lisinopril Cough   Morphine Hives    Social History:   Social History   Socioeconomic History   Marital status: Divorced    Spouse name: Not on file   Number of children: 2   Years of education: 14   Highest education level: Not on file  Occupational History   Occupation: Social research officer, government: [employer]  Tobacco Use   Smoking  status: Former    Years: 20.00    Types: Cigarettes    Quit date: 10/24/1998    Years since quitting: 23.5   Smokeless tobacco: Never  Tobacco comments:    1 cig daily  Vaping Use   Vaping Use: Never used  Substance and Sexual Activity   Alcohol use: Not Currently    Comment: "no alcohol in years"   Drug use: No   Sexual activity: Never    Birth control/protection: Post-menopausal  Other Topics Concern   Not on file  Social History Narrative   Right handed   One story with a basement   Drinks caffeine   Social Determinants of Health   Financial Resource Strain: Not on file  Food Insecurity: No Food Insecurity (04/20/2022)   Hunger Vital Sign    Worried About Running Out of Food in the Last Year: Never true    Ran Out of Food in the Last Year: Never true  Transportation Needs: No Transportation Needs (04/20/2022)   PRAPARE - Hydrologist (Medical): No    Lack of Transportation (Non-Medical): No  Physical Activity: Not on file  Stress: Not on file  Social Connections: Not on file  Intimate Partner Violence: Not At Risk (04/20/2022)   Humiliation, Afraid, Rape, and Kick questionnaire    Fear of Current or Ex-Partner: No    Emotionally Abused: No    Physically Abused: No    Sexually Abused: No    Family History:    Family History  Problem Relation Age of Onset   Heart attack Mother        CABG   Stroke Mother    Hypertension Mother    Cancer Mother    Diabetes Mother    Heart attack Father        CABG   Hypertension Father    Mesothelioma Father    Heart attack Brother    Diabetes Brother    Depression Brother    Hyperlipidemia Brother    Diabetes Maternal Grandmother    Diabetes Maternal Grandfather    Cancer Paternal Grandmother    Diabetes Brother    Hyperlipidemia Brother    Wilson's disease Other        2 nieces, one died in her 2s   Colon cancer Neg Hx      ROS:  Please see the history of present illness.  ROS  none All other ROS reviewed and negative.     Physical Exam/Data:   Vitals:   04/26/22 0430 04/26/22 0436 04/26/22 0706 04/26/22 0900  BP: 126/71   (!) 126/39  Pulse: (!) 116 83  (!) 114  Resp: (!) 23 19  (!) 22  Temp:      TempSrc:      SpO2: 94% 95% 97% 95%  Weight:  116.4 kg    Height:        Intake/Output Summary (Last 24 hours) at 04/26/2022 1032 Last data filed at 04/26/2022 0900 Gross per 24 hour  Intake 2757.45 ml  Output 535 ml  Net 2222.45 ml   Filed Weights   04/23/22 1900 04/25/22 0500 04/26/22 0436  Weight: 117.9 kg 113.1 kg 116.4 kg   Body mass index is 44.05 kg/m.  General:  Well nourished, well developed, in no acute distress, on nasal cannula oxygen HEENT: normal Lymph: no adenopathy Neck: no JVD Endocrine:  No thryomegaly Vascular: No carotid bruits; FA pulses 2+ bilaterally without bruits  Cardiac:  normal S1, S2; RRR; no murmur  Lungs: Bilateral Rales present Abd: soft, nontender, no hepatomegaly  Ext: no edema Musculoskeletal:  No deformities, BUE and BLE strength normal and equal Skin: warm and  dry  Neuro:  CNs 2-12 intact, no focal abnormalities noted Psych:  Normal affect   EKG:  The EKG was personally reviewed and demonstrates: Atrial fibrillation Telemetry:  Telemetry was personally reviewed and demonstrates: Atrial fibrillation  Relevant CV Studies: Echo on 04/25/22  1. Left ventricular ejection fraction, by estimation, is 70 to 75%. The  left ventricle has hyperdynamic function. The left ventricle has no  regional wall motion abnormalities. There is mild left ventricular  hypertrophy. Left ventricular diastolic  parameters are consistent with Grade I diastolic dysfunction (impaired  relaxation).   2. Right ventricular systolic function is normal. The right ventricular  size is normal. Tricuspid regurgitation signal is inadequate for assessing  PA pressure. 3. Left atrial size was mildly dilated.   4. The mitral valve is grossly  normal. Trivial mitral valve  regurgitation.   5. The aortic valve is tricuspid. There is mild calcification of the  aortic valve. Aortic valve regurgitation is not visualized. Aortic valve  sclerosis/calcification is present, without any evidence of aortic  stenosis. Aortic valve mean gradient  measures 12.0 mmHg.   6. The inferior vena cava is dilated in size with >50% respiratory  variability, suggesting right atrial pressure of 8 mmHg.   Laboratory Data:  Chemistry Recent Labs  Lab 04/25/22 0330 04/25/22 1703 04/26/22 0401  NA 137 135 136  K 3.1* 3.5 4.3  CL 103 104 110  CO2 26 27 25   GLUCOSE 124* 94 92  BUN 17 24* 25*  CREATININE 0.60 0.69 0.65  CALCIUM 9.5 9.5 9.4  GFRNONAA >60 >60 >60  ANIONGAP 8 4* 1*    Recent Labs  Lab 04/23/22 0511 04/24/22 0400 04/25/22 0330 04/25/22 1703 04/26/22 0401  PROT 8.7* 9.7* 9.8*  --   --   ALBUMIN 2.1* 2.5* 3.0* 2.7* 2.5*  AST 46* 39 31  --   --   ALT 21 21 19   --   --   ALKPHOS 77 78 69  --   --   BILITOT 1.7* 1.7* 1.3*  --   --    Hematology Recent Labs  Lab 04/24/22 0400 04/25/22 0330 04/26/22 0350  WBC 7.6 14.9* 12.3*  RBC 3.07* 3.03* 3.15*  HGB 9.7* 9.6* 10.0*  HCT 29.9* 29.4* 31.2*  MCV 97.4 97.0 99.0  MCH 31.6 31.7 31.7  MCHC 32.4 32.7 32.1  RDW 15.6* 15.6* 15.9*  PLT 109* 144* 205   Cardiac EnzymesNo results for input(s): "TROPONINI" in the last 168 hours. No results for input(s): "TROPIPOC" in the last 168 hours.  BNP Recent Labs  Lab 04/19/22 1709 04/23/22 1617  BNP 81.0 64.0    DDimer No results for input(s): "DDIMER" in the last 168 hours.  Assessment and Plan:   Patient is a 66 year old female known to have OSA, liver cirrhosis secondary to [name] presented to the ER with hepatic encephalopathy.  Hospital course was complicated by new onset A-fib with RVR.  Cardiology was consulted for the same.  #Atrial fibrillation with RVR, CV score 5 (telemetry HR: 90-120s) PLAN Rate control: Increase  Cardizem drip from 10 mg/h to 15 mg/h Rhythm control: Patient is refractory to pharmacological management, patient will need rhythm control with DCCV Systemic anticoagulation: Start warfarin with INR goal of 2-3 if there are no contraindications.  DOACs are not recommended. CV score is 5. Patient will benefit from systemic anticoagulation to prevent strokes in the future. Patient agreeable to be started on warfarin and understands to be compliant with INR checks. Risk  factor modification: Aggressive management of OSA. follow-up with pulmonary as outpatient.  She already has an upcoming appointment.   #Wet and warm/ADHF/HFpEF with LVEF 60%/NYHA class III/ACC AHA stage C, currently mildly decompensated with bilateral rails PLAN -Continue IV Lasix 40 mg twice a day -Patient will benefit from SGLT2 inhibitors, Farxiga or Jardiance 10 mg once daily.  I have spent a total of 60 minutes with patient reviewing chart , telemetry, EKGs, labs and examining patient as well as establishing an assessment and plan that was discussed with the patient.  > 50% of time was spent in direct patient care.      For questions or updates, please contact [place] Please consult [email or web address] for contact info under Cardiology/STEMI.   Signed, Vangie Bicker, MD 04/26/2022 10:32 AM

## 2022-04-26 NOTE — Progress Notes (Signed)
Rectal tube and foley catheter removed at this time per MD order. Pt sitting on bsc at this time, call bell within reach.

## 2022-04-26 NOTE — Plan of Care (Signed)
  Problem: Acute Rehab OT Goals (only OT should resolve) Goal: Pt. Will Perform Grooming Flowsheets (Taken 04/26/2022 1037) Pt Will Perform Grooming:  with modified independence  standing  with adaptive equipment Goal: Pt. Will Perform Lower Body Bathing Flowsheets (Taken 04/26/2022 1037) Pt Will Perform Lower Body Bathing:  with modified independence  with adaptive equipment  sitting/lateral leans Goal: Pt. Will Perform Lower Body Dressing Flowsheets (Taken 04/26/2022 1037) Pt Will Perform Lower Body Dressing:  with modified independence  sitting/lateral leans  with adaptive equipment Goal: Pt. Will Transfer To Toilet Flowsheets (Taken 04/26/2022 1037) Pt Will Transfer to Toilet:  with modified independence  ambulating  Brigitte Soderberg OT, MOT

## 2022-04-26 NOTE — Progress Notes (Addendum)
PROGRESS NOTE  Yolanda [name]:[id number] DOB: 1956-01-18   PCP: Celene Squibb, MD  Patient is from: Home.  Lives with daughter.  Uses rollator at baseline.  DOA: 04/19/2022 LOS: 6  Chief complaints Chief Complaint  Patient presents with   Altered Mental Status     Brief Narrative / Interim history: 66 year old F with PMH of NASH liver cirrhosis, morbid obesity, PE not on AC, OSA, HTN, anxiety, depression, GERD, hypothyroidism and psoriatic arthritis presenting with progressive shortness of breath, disorientation, fatigue and nausea, and admitted for acute metabolic encephalopathy in the setting of hepatic encephalopathy, pneumonia and UTI.  CXR also raised concern for cardiomegaly with small pleural effusion and mixed interstitial and airspace opacities concerning for edema.  He was started on IV Lasix.  MRI brain was negative for acute finding.  Patient had acute respiratory distress the evening of 10/15.  CTA chest negative for PE but multifocal pneumonia and moderate right pleural effusion.  Antibiotics escalated.  Diuretics increased.  She was transferred to stepdown unit for BiPAP.  IR consulted but right pleural effusion felt to be insufficient for thoracocentesis.  Palliative medicine consulted for goals of care discussion.  Patient developed severe abdominal pain the evening of 10/16.  KUB and CT abdomen and pelvis raised concern for ileus.  She also went into A-fib with RVR the morning of 10/17, and started on Cardizem drip.  Subjective: -Ambulated with physical therapist -Tolerating liquid diet -Significant amount of stool in rectal tube with lactulose enema -No fevers or chills  Objective: Vitals:   04/26/22 1100 04/26/22 1540 04/26/22 1553 04/26/22 1700  BP: 113/70 109/63  113/69  Pulse: (!) 104 88  (!) 118  Resp: 17 (!) 23  (!) 27  Temp:   98.1 F (36.7 C)   TempSrc:   Oral   SpO2: 94% 96%  93%  Weight:      Height:        Examination:  Physical  Exam  Gen:- Awake Alert, in no acute distress  HEENT:- [place].AT,   Nose- [place] 3L/min Neck-Supple Neck,No JVD,.  Lungs-fair air movement, no wheezing  CV- S1, S2 normal, irregular irregular and tachycardic Abd-  +ve B.Sounds, Abd Soft, No tenderness, increased truncal adiposity Extremity/Skin:-Inc. removed edema,   good pedal pulses  Psych-affect is appropriate, oriented x3--- cognitively more appropriate, episodes of forgetfulness and disorientation continues from time to time Neuro-generalized weakness no new focal deficits, no tremors   Procedures:  None  Microbiology summarized: 10/11-blood cultures NGTD 10/11-urine culture with Enterobacter aerogenes. 10/15-MRSA PCR screen nonreactive.  Assessment and plan: Principal Problem:   Acute metabolic encephalopathy Active Problems:   Hyperammonemia /Hepatic encephalopathy   Sleep apnea   UTI (urinary tract infection)   Mixed hyperlipidemia   Anxiety and depression   Essential hypertension   GERD (gastroesophageal reflux disease)   Obesity, Class III, BMI 40-49.9 (morbid obesity) (HCC)   Hypothyroidism   Anxiety   Chronic respiratory failure with hypoxia (HCC)   Asthma, chronic   Morbid obesity (HCC)   Acute on chronic diastolic CHF (congestive heart failure) (HCC)   Malnutrition of moderate degree   Paroxysmal atrial fibrillation with RVR (HCC)   Ileus (HCC)  Acute respiratory distress/chronic hypoxic RF on 3 L-/OSA/??OHSmultifactorial including pneumonia, possible diastolic CHF, pleural effusion, asthma and underlying OSA/possible OHS.  Fluid status and breathing seems to have improved. CTA negative for PE but multifocal pneumonia and effusion.  Cardiac enzymes and EKG negative. -Continue BiPAP as needed and when she  sleeps -Prior to admission patient had a CPAP machine at home plan was to get another sleep study with titration to see if she needs a pressure change, or if she needs a BiPAP  Acute diastolic CHF: Had PND,  orthopnea and DOE for months UA. Last TTE in 11/2021 with LVEF of 65 to 70% and indeterminate DD.  Difficult to assess fluid status due to body habitus.  CT chest on 10/15 with moderate right pleural effusion and some vascular congestion despite IV Lasix.   -Fluid status seems to have improved after increasing Lasix. -Continue BiPAP as needed and at night. -Cardiology input appreciated -Strict intake and output.  -Remove Foley  Multifocal pneumonia: CTA chest with multifocal pneumonia and moderate right and mild left pleural effusion.  Difficult to tell if CTA finding is active pneumonia or aftereffect.  Pro-Cal mildly elevated to 0.35>> 0.31.  MRSA PCR negative. -Continue cefepime and doxycycline -Discontinued vancomycin.  Discontinue Flagyl.  Could be contributing to GI symptoms. -Respiratory status and oxygen requirement improving  A-fib with RVR: Likely provoked.  Overdiuresis?  No history of A-fib. -Hold off anticoagulation for now given coagulopathy. -Cardiology consult appreciated -c/n P.o. metoprolol and  IV Cardizem at 67m/hr -Consider transition away from IV Cardizem on 04/27/2022-if rate control continues to be good -Cardiology is recommending Coumadin therapy  Acute metabolic encephalopathy: Multifactorial including hepatic encephalopathy and infection. TSH and B12 within normal.   -Overall much improved with lactulose enema -Switch to p.o. lactulose -Remove rectal tube -Reorientation and delirium precautions. -Discontinue Ativan and fentanyl.  Abdominal pain/ileus: Was not on opiate or meds with anticholinergic effect.  She had diarrhea with lactulose until 10/15.  Lactulose decreased on 10/16 -Discontinue opiates -Mobilize when stable -Continue lactulose -Scheduled antiemetics with as needed -No nausea no vomiting tolerating oral intake -Having lots of bowel movement with lactulose enemas -Clinically ileus appears to be improving  NASH cirrhosis/elevated  AST/hyperbilirubinemia/coagulopathy: Stable. -Continue lactulose for elevated ammonia. -Hold home rifaximin while on antibiotics  Urinary tract infection: Worsening urge incontinence POA.  Urine culture with Enterobacter aerogenes.  Completed appropriate antibiotic course.    Hypokalemia/hypomagnesemia:  -P.o. KCl 40x x2  Hypothyroidism: TSH within normal. -Continue home Synthroid   GERD -Continue PPI    Mixed hyperlipidemia -Continue home Crestor  Generalized weakness/physical deconditioning -PT/OT eval  Normocytic anemia/thrombocytopenia: Likely due to liver cirrhosis.  Anemia panel suggests anemia of chronic disease.  Improving. -Monitor.  Hypoalbuminemia -IV albumin as above.  Increased protein gap: Serum protein 8.7.  Albumin 2.1.  UA without proteinuria.  No history of MM -Follow SPEP  Goal of care counseling: Remains full code. -See IPAL note on 10/16 -Palliative medicine requested/appreciated  Morbid obesity Body mass index is 44.05 kg/m.  Moderate malnutrition Nutrition Problem: Moderate Malnutrition Etiology: acute illness (hepatic encephalopathy, AMS,taste change, loss of appetite) Signs/Symptoms: energy intake < 75% for > 7 days, mild muscle depletion (taste change, loss of appetite) Interventions: Premier Protein, Prostat, MVI   DVT prophylaxis:  heparin injection 5,000 Units Start: 04/19/22 2200 SCDs Start: 04/19/22 2058  Code Status: Full code Family Communication: Updated patient's daughter MWilliam Hamburgerat bedside. Level of care: Stepdown Status is: Inpatient The patient will remain inpatient because: Acute CHF, A-fib with RVR, pneumonia and ileus,  Final disposition: anticipate dc home in 24 to 48 hrs if rate control improves and ileus improves Consultants:  Interventional radiology Palliative medicine  Sch Meds:  Scheduled Meds:  aspirin EC  81 mg Oral Daily   Chlorhexidine Gluconate Cloth  6 each Topical Daily   DULoxetine  60 mg  Oral Daily   heparin  5,000 Units Subcutaneous Q8H   lactulose  300 mL Rectal BID   levothyroxine  75 mcg Oral Q0600   metoCLOPramide (REGLAN) injection  5 mg Intravenous TID AC & HS   metoprolol tartrate  12.5 mg Oral BID   midodrine  5 mg Oral BID WC   mometasone-formoterol  2 puff Inhalation BID   multivitamin with minerals  1 tablet Oral Daily   pantoprazole  40 mg Oral Daily   Ensure Max Protein  11 oz Oral BID   Continuous Infusions:  ceFEPime (MAXIPIME) IV 2 g (04/26/22 1106)   diltiazem (CARDIZEM) infusion 10 mg/hr (04/26/22 1515)   doxycycline (VIBRAMYCIN) IV 100 mg (04/26/22 0733)   PRN Meds:.acetaminophen **OR** acetaminophen, ipratropium-albuterol, LORazepam, ondansetron **OR** ondansetron (ZOFRAN) IV  Antimicrobials: Anti-infectives (From admission, onward)    Start     Dose/Rate Route Frequency Ordered Stop   04/24/22 2030  vancomycin (VANCOREADY) IVPB 1250 mg/250 mL  Status:  Discontinued       See Hyperspace for full Linked Orders Report.   1,250 mg 166.7 mL/hr over 90 Minutes Intravenous Every 24 hours 04/23/22 1856 04/24/22 0843   04/23/22 2030  vancomycin (VANCOREADY) IVPB 2000 mg/400 mL       See Hyperspace for full Linked Orders Report.   2,000 mg 200 mL/hr over 120 Minutes Intravenous  Once 04/23/22 1856 04/24/22 1259   04/23/22 2000  ceFEPIme (MAXIPIME) 2 g in sodium chloride 0.9 % 100 mL IVPB        2 g 200 mL/hr over 30 Minutes Intravenous Every 8 hours 04/23/22 1844     04/23/22 1915  metroNIDAZOLE (FLAGYL) IVPB 500 mg  Status:  Discontinued        500 mg 100 mL/hr over 60 Minutes Intravenous Every 12 hours 04/23/22 1828 04/25/22 1146   04/23/22 1915  doxycycline (VIBRAMYCIN) 100 mg in sodium chloride 0.9 % 250 mL IVPB        100 mg 125 mL/hr over 120 Minutes Intravenous Every 12 hours 04/23/22 1828     04/23/22 0900  fluconazole (DIFLUCAN) tablet 150 mg        150 mg Oral  Once 04/23/22 0810 04/23/22 0853   04/20/22 1800  cefTRIAXone (ROCEPHIN) 1 g  in sodium chloride 0.9 % 100 mL IVPB  Status:  Discontinued        1 g 200 mL/hr over 30 Minutes Intravenous Every 24 hours 04/20/22 0134 04/23/22 1829   04/19/22 2200  rifaximin (XIFAXAN) tablet 550 mg  Status:  Discontinued        550 mg Oral 2 times daily 04/19/22 2057 04/23/22 1828   04/19/22 1845  cefTRIAXone (ROCEPHIN) 1 g in sodium chloride 0.9 % 100 mL IVPB        1 g 200 mL/hr over 30 Minutes Intravenous  Once 04/19/22 1838 04/19/22 1956   04/19/22 1845  doxycycline (VIBRA-TABS) tablet 100 mg        100 mg Oral  Once 04/19/22 1838 04/19/22 1912      I have personally reviewed the following labs and images: CBC: Recent Labs  Lab 04/20/22 0307 04/21/22 0345 04/22/22 0347 04/23/22 0511 04/24/22 0400 04/25/22 0330 04/26/22 0350  WBC 7.0   < > 5.8 6.7 7.6 14.9* 12.3*  NEUTROABS 5.2  --   --   --   --   --   --   HGB 8.9*   < >  9.5* 9.6* 9.7* 9.6* 10.0*  HCT 27.0*   < > 29.0* 29.6* 29.9* 29.4* 31.2*  MCV 95.7   < > 97.6 96.4 97.4 97.0 99.0  PLT 99*   < > 110* 124* 109* 144* 205   < > = values in this interval not displayed.   BMP &GFR Recent Labs  Lab 04/23/22 0511 04/24/22 0400 04/25/22 0330 04/25/22 1703 04/26/22 0350 04/26/22 0401  NA 132* 132* 137 135  --  136  K 3.6 3.6 3.1* 3.5  --  4.3  CL 101 102 103 104  --  110  CO2 26 26 26 27   --  25  GLUCOSE 111* 162* 124* 94  --  92  BUN 9 11 17  24*  --  25*  CREATININE 0.57 0.57 0.60 0.69  --  0.65  CALCIUM 8.3* 8.7* 9.5 9.5  --  9.4  MG 1.8 1.8 2.0 1.8 2.1  --   PHOS 3.1 2.8 3.3 3.3  --  3.1   Estimated Creatinine Clearance: 85.5 mL/min (by C-G formula based on SCr of 0.65 mg/dL). Liver & Pancreas: Recent Labs  Lab 04/20/22 0307 04/21/22 0345 04/22/22 0347 04/23/22 0511 04/24/22 0400 04/25/22 0330 04/25/22 1703 04/26/22 0401  AST 56*  --  48* 46* 39 31  --   --   ALT 23  --  22 21 21 19   --   --   ALKPHOS 79  --  79 77 78 69  --   --   BILITOT 1.6*  --  0.7 1.7* 1.7* 1.3*  --   --   PROT 8.5*  --   8.3* 8.7* 9.7* 9.8*  --   --   ALBUMIN <1.5*   < > <1.5* 2.1* 2.5* 3.0* 2.7* 2.5*   < > = values in this interval not displayed.    Recent Labs  Lab 04/22/22 0347 04/23/22 0511 04/25/22 0330 04/26/22 0401  AMMONIA 58* 27 47* 40*   Diabetic: No results for input(s): "HGBA1C" in the last 72 hours. Recent Labs  Lab 04/23/22 1122 04/23/22 1554  GLUCAP 96 138*   Coagulation Profile: Recent Labs  Lab 04/24/22 0400 04/25/22 0330 04/26/22 0350  INR 1.5* 1.6* 1.7*   Urine analysis:    Component Value Date/Time   COLORURINE YELLOW 04/22/2022 1430   APPEARANCEUR CLEAR 04/22/2022 1430   APPEARANCEUR Clear 03/08/2022 1549   LABSPEC 1.016 04/22/2022 1430   PHURINE 5.0 04/22/2022 1430   GLUCOSEU NEGATIVE 04/22/2022 1430   HGBUR SMALL (A) 04/22/2022 1430   BILIRUBINUR NEGATIVE 04/22/2022 1430   BILIRUBINUR Negative 03/08/2022 1549   KETONESUR NEGATIVE 04/22/2022 1430   PROTEINUR NEGATIVE 04/22/2022 1430   UROBILINOGEN negative (A) 07/02/2019 0932   UROBILINOGEN 0.2 04/15/2015 0028   NITRITE NEGATIVE 04/22/2022 1430   LEUKOCYTESUR SMALL (A) 04/22/2022 1430   Sepsis Labs: Invalid input(s): "PROCALCITONIN", "LACTICIDVEN"  Microbiology: Recent Results (from the past 240 hour(s))  Blood culture (routine x 2)     Status: None   Collection Time: 04/19/22  5:09 PM   Specimen: Left Antecubital; Blood  Result Value Ref Range Status   Specimen Description LEFT ANTECUBITAL  Final   Special Requests   Final    BOTTLES DRAWN AEROBIC AND ANAEROBIC Blood Culture adequate volume   Culture   Final    NO GROWTH 5 DAYS Performed at [facility], [address], [place], [place] [address]    Report Status 04/24/2022 FINAL  Final  Blood culture (routine x 2)  Status: None   Collection Time: 04/19/22  5:20 PM   Specimen: BLOOD RIGHT ARM  Result Value Ref Range Status   Specimen Description BLOOD RIGHT ARM  Final   Special Requests   Final    BOTTLES DRAWN AEROBIC AND ANAEROBIC Blood  Culture adequate volume   Culture   Final    NO GROWTH 5 DAYS Performed at [facility], [address] [place], [place] [address]    Report Status 04/24/2022 FINAL  Final  Urine Culture     Status: Abnormal   Collection Time: 04/19/22  7:27 PM   Specimen: Urine, Clean Catch  Result Value Ref Range Status   Specimen Description   Final    URINE, CLEAN CATCH Performed at [facility], [address] [place], [place] [address]    Special Requests   Final    NONE Performed at [facility], [address], [place], [place] [address]    Culture >=100,000 COLONIES/mL ENTEROBACTER AEROGENES (A)  Final   Report Status 04/22/2022 FINAL  Final   Organism ID, Bacteria ENTEROBACTER AEROGENES (A)  Final      Susceptibility   Enterobacter aerogenes - MIC*    CEFAZOLIN >=64 RESISTANT Resistant     CEFEPIME <=0.12 SENSITIVE Sensitive     CEFTRIAXONE <=0.25 SENSITIVE Sensitive     CIPROFLOXACIN <=0.25 SENSITIVE Sensitive     GENTAMICIN <=1 SENSITIVE Sensitive     IMIPENEM 0.5 SENSITIVE Sensitive     NITROFURANTOIN 64 INTERMEDIATE Intermediate     TRIMETH/SULFA <=20 SENSITIVE Sensitive     PIP/TAZO <=4 SENSITIVE Sensitive     * >=100,000 COLONIES/mL ENTEROBACTER AEROGENES  MRSA Next Gen by PCR, Nasal     Status: None   Collection Time: 04/23/22  7:00 PM  Result Value Ref Range Status   MRSA by PCR Next Gen NOT DETECTED NOT DETECTED Final    Comment: (NOTE) The GeneXpert MRSA Assay (FDA approved for NASAL specimens only), is one component of a comprehensive MRSA colonization surveillance program. It is not intended to diagnose MRSA infection nor to guide or monitor treatment for MRSA infections. Test performance is not FDA approved in patients less than 46 years old. Performed at [facility], [address], [place], [place] [address]    Jamas Jaquay, MD  [facility] If 7PM-7AM, please contact night-coverage [email or web address] 04/26/2022, 5:20 PM

## 2022-04-26 NOTE — Progress Notes (Signed)
Went by patient's room to check on patient.  Patient resting, CPAP off, patient on .

## 2022-04-27 ENCOUNTER — Other Ambulatory Visit: Payer: Self-pay

## 2022-04-27 DIAGNOSIS — I482 Chronic atrial fibrillation, unspecified: Secondary | ICD-10-CM

## 2022-04-27 DIAGNOSIS — G9341 Metabolic encephalopathy: Secondary | ICD-10-CM | POA: Diagnosis not present

## 2022-04-27 DIAGNOSIS — Z9189 Other specified personal risk factors, not elsewhere classified: Secondary | ICD-10-CM

## 2022-04-27 LAB — COMPREHENSIVE METABOLIC PANEL
ALT: 16 U/L (ref 0–44)
AST: 28 U/L (ref 15–41)
Albumin: 2.2 g/dL — ABNORMAL LOW (ref 3.5–5.0)
Alkaline Phosphatase: 57 U/L (ref 38–126)
Anion gap: 2 — ABNORMAL LOW (ref 5–15)
BUN: 22 mg/dL (ref 8–23)
CO2: 25 mmol/L (ref 22–32)
Calcium: 9.1 mg/dL (ref 8.9–10.3)
Chloride: 108 mmol/L (ref 98–111)
Creatinine, Ser: 0.64 mg/dL (ref 0.44–1.00)
GFR, Estimated: >60 mL/min (ref >60)
Glucose, Bld: 115 mg/dL — ABNORMAL HIGH (ref 70–99)
Potassium: 3.5 mmol/L (ref 3.5–5.1)
Sodium: 135 mmol/L (ref 135–145)
Total Bilirubin: 1.4 mg/dL — ABNORMAL HIGH (ref 0.3–1.2)
Total Protein: 8.5 g/dL — ABNORMAL HIGH (ref 6.5–8.1)

## 2022-04-27 LAB — CBC
HCT: 28.3 % — ABNORMAL LOW (ref 36.0–46.0)
Hemoglobin: 9.1 g/dL — ABNORMAL LOW (ref 12.0–15.0)
MCH: 31.9 pg (ref 26.0–34.0)
MCHC: 32.2 g/dL (ref 30.0–36.0)
MCV: 99.3 fL (ref 80.0–100.0)
Platelets: 176 10*3/uL (ref 150–400)
RBC: 2.85 MIL/uL — ABNORMAL LOW (ref 3.87–5.11)
RDW: 16 % — ABNORMAL HIGH (ref 11.5–15.5)
WBC: 8.4 10*3/uL (ref 4.0–10.5)
nRBC: 0 % (ref 0.0–0.2)

## 2022-04-27 LAB — PROTIME-INR
INR: 1.8 — ABNORMAL HIGH (ref 0.8–1.2)
Prothrombin Time: 20.5 seconds — ABNORMAL HIGH (ref 11.4–15.2)

## 2022-04-27 MED ORDER — METOPROLOL TARTRATE 25 MG PO TABS
25.0000 mg | ORAL_TABLET | Freq: Two times a day (BID) | ORAL | Status: DC
  Administered 2022-04-27: 25 mg via ORAL
  Filled 2022-04-27: qty 1

## 2022-04-27 MED ORDER — METFORMIN HCL ER 500 MG PO TB24: 500.0000 mg | ORAL_TABLET | Freq: Two times a day (BID) | ORAL | 1 refills | Status: DC

## 2022-04-27 MED ORDER — POTASSIUM CHLORIDE CRYS ER 20 MEQ PO TBCR
40.0000 meq | EXTENDED_RELEASE_TABLET | Freq: Once | ORAL | Status: AC
  Administered 2022-04-27: 40 meq via ORAL
  Filled 2022-04-27: qty 2

## 2022-04-27 MED ORDER — DILTIAZEM HCL ER COATED BEADS 120 MG PO CP24: 120.0000 mg | ORAL_CAPSULE | Freq: Every day | ORAL | 11 refills | Status: DC

## 2022-04-27 MED ORDER — ACETAMINOPHEN 325 MG PO TABS: 650.0000 mg | ORAL_TABLET | Freq: Four times a day (QID) | ORAL | 2 refills | Status: AC | PRN

## 2022-04-27 MED ORDER — RIFAXIMIN 550 MG PO TABS: 550.0000 mg | ORAL_TABLET | Freq: Two times a day (BID) | ORAL | 3 refills | Status: DC

## 2022-04-27 MED ORDER — MIDODRINE HCL 5 MG PO TABS: 5.0000 mg | ORAL_TABLET | Freq: Two times a day (BID) | ORAL | 1 refills | Status: DC

## 2022-04-27 MED ORDER — LACTULOSE ENCEPHALOPATHY 10 GM/15ML PO SOLN: 20.0000 g | Freq: Two times a day (BID) | ORAL | 5 refills | Status: AC

## 2022-04-27 MED ORDER — DILTIAZEM HCL 30 MG PO TABS
30.0000 mg | ORAL_TABLET | Freq: Four times a day (QID) | ORAL | Status: DC
  Administered 2022-04-27: 30 mg via ORAL
  Filled 2022-04-27: qty 1

## 2022-04-27 MED ORDER — METOPROLOL TARTRATE 25 MG PO TABS: 25.0000 mg | ORAL_TABLET | Freq: Two times a day (BID) | ORAL | 2 refills | Status: DC

## 2022-04-27 MED ORDER — AMOXICILLIN-POT CLAVULANATE 875-125 MG PO TABS: 1.0000 | ORAL_TABLET | Freq: Two times a day (BID) | ORAL | 0 refills | Status: DC

## 2022-04-27 MED ORDER — FUROSEMIDE 40 MG PO TABS: 40.0000 mg | ORAL_TABLET | Freq: Every day | ORAL | 1 refills | Status: DC

## 2022-04-27 MED ORDER — POTASSIUM CHLORIDE ER 10 MEQ PO TBCR: 10.0000 meq | EXTENDED_RELEASE_TABLET | Freq: Every day | ORAL | 2 refills | Status: DC

## 2022-04-27 MED ORDER — ORAL CARE MOUTH RINSE: 15.0000 mL | OROMUCOSAL | Status: DC | PRN

## 2022-04-27 MED ORDER — DOXYCYCLINE HYCLATE 100 MG PO TABS: 100.0000 mg | ORAL_TABLET | Freq: Two times a day (BID) | ORAL | 0 refills | Status: DC

## 2022-04-27 MED ORDER — SPIRONOLACTONE 50 MG PO TABS: 50.0000 mg | ORAL_TABLET | Freq: Every day | ORAL | 3 refills | Status: DC

## 2022-04-27 NOTE — Discharge Instructions (Signed)
1)Please take lactulose as prescribed to prevent further confusion /hepatic encephalopathy goal is 2 to 3 soft/mushy BMs, Ok to take additional dose if No BM in 24 hrs  2)Repeat CBC and BMP blood test within a week  3)Follow-up with cardiology to discuss further management of atrial fibrillation and possible evaluation for Watchman device which can help prevent strokes  4)Avoid ibuprofen/Advil/Aleve/Motrin/Goody Powders/Naproxen/BC powders/Meloxicam/Diclofenac/Indomethacin and other Nonsteroidal anti-inflammatory medications as these will make you more likely to bleed and can cause stomach ulcers, can also cause Kidney problems.

## 2022-04-27 NOTE — Progress Notes (Signed)
  SATURATION QUALIFICATIONS: (This note is used to comply with regulatory documentation for home oxygen)   Patient Saturations on Room Air at Rest = 86 %   Patient Saturations on Room Air while Ambulating = 84 %   Patient Saturations on 3 Liters of oxygen while Ambulating = 93 %    Patient needs continuous O2 at 3 L/min continuously via nasal cannula with humidifier, with gaseous portability and conserving device    Patient also Needs "homefill oxygen tubing" replaced  Dx- Chronic hypoxic respiratory failure in the setting of chronic diastolic dysfunction congestive heart failure and obstructive sleep apnea with possible OHS - Roxan Hockey, MD

## 2022-04-27 NOTE — Progress Notes (Signed)
Progress Note  Patient Name: Yolanda [name] Date of Encounter: 04/27/2022  Primary Cardiologist: None  Subjective   No acute events overnight.  Patient converted to normal sinus rhythm last night.  Diltiazem drip was weaned off and p.o. diltiazem tablets were started by the primary team. Patient denied any symptoms.   Daughter stated that patient had 4 falls since June of this year and hence was not comfortable with any anticoagulation for stroke prevention.  Inpatient Medications    Scheduled Meds:  aspirin EC  81 mg Oral Daily   Chlorhexidine Gluconate Cloth  6 each Topical Daily   diltiazem  30 mg Oral Q6H   DULoxetine  60 mg Oral Daily   heparin  5,000 Units Subcutaneous Q8H   lactulose  30 g Oral TID   levothyroxine  75 mcg Oral Q0600   metoCLOPramide (REGLAN) injection  5 mg Intravenous TID AC & HS   metoprolol tartrate  25 mg Oral BID   midodrine  5 mg Oral BID WC   mometasone-formoterol  2 puff Inhalation BID   multivitamin with minerals  1 tablet Oral Daily   pantoprazole  40 mg Oral Daily   Ensure Max Protein  11 oz Oral BID   Continuous Infusions:  ceFEPime (MAXIPIME) IV 2 g (04/27/22 0521)   diltiazem (CARDIZEM) infusion Stopped (04/27/22 1018)   doxycycline (VIBRAMYCIN) IV 100 mg (04/27/22 0954)   PRN Meds: acetaminophen **OR** acetaminophen, ipratropium-albuterol, LORazepam, ondansetron **OR** ondansetron (ZOFRAN) IV, mouth rinse   Vital Signs    Vitals:   04/27/22 0700 04/27/22 0715 04/27/22 0800 04/27/22 1126  BP: (!) 114/55  124/65   Pulse: 67  78   Resp: 20  20   Temp:  97.9 F (36.6 C)  98.1 F (36.7 C)  TempSrc:  Axillary  Oral  SpO2: 94%  99%   Weight:      Height:        Intake/Output Summary (Last 24 hours) at 04/27/2022 1145 Last data filed at 04/27/2022 0000 Gross per 24 hour  Intake 1200.13 ml  Output 1450 ml  Net -249.87 ml   Filed Weights   04/25/22 0500 04/26/22 0436 04/27/22 0500  Weight: 113.1 kg 116.4 kg 117.5 kg     Telemetry     Personally reviewed.  Patient converted to normal sinus rhythm last night, currently rates ranging in the 70s.  ECG    An ECG dated 04/25/22 was personally reviewed today and demonstrated:  Atrial fibrillation with RVR  Physical Exam   GEN: No acute distress.   Neck: No JVD. Cardiac: RRR, no murmur, rub, or gallop.  Respiratory: Nonlabored. Clear to auscultation bilaterally. GI: Soft, nontender, bowel sounds present. MS: No edema; No deformity. Neuro:  Nonfocal. Psych: Alert and oriented x 3. Normal affect.  Labs    Chemistry Recent Labs  Lab 04/24/22 0400 04/25/22 0330 04/25/22 1703 04/26/22 0401 04/27/22 0321  NA 132* 137 135 136 135  K 3.6 3.1* 3.5 4.3 3.5  CL 102 103 104 110 108  CO2 26 26 27 25 25   GLUCOSE 162* 124* 94 92 115*  BUN 11 17 24* 25* 22  CREATININE 0.57 0.60 0.69 0.65 0.64  CALCIUM 8.7* 9.5 9.5 9.4 9.1  PROT 9.7* 9.8*  --   --  8.5*  ALBUMIN 2.5* 3.0* 2.7* 2.5* 2.2*  AST 39 31  --   --  28  ALT 21 19  --   --  16  ALKPHOS 78 69  --   --  71  BILITOT 1.7* 1.3*  --   --  1.4*  GFRNONAA >60 >60 >60 >60 >60  ANIONGAP 4* 8 4* 1* 2*     Hematology Recent Labs  Lab 04/25/22 0330 04/26/22 0350 04/27/22 0321  WBC 14.9* 12.3* 8.4  RBC 3.03* 3.15* 2.85*  HGB 9.6* 10.0* 9.1*  HCT 29.4* 31.2* 28.3*  MCV 97.0 99.0 99.3  MCH 31.7 31.7 31.9  MCHC 32.7 32.1 32.2  RDW 15.6* 15.9* 16.0*  PLT 144* 205 176    Cardiac Enzymes Recent Labs  Lab 04/23/22 1605 04/23/22 1811  TROPONINIHS 8 7    BNP Recent Labs  Lab 04/23/22 1617  BNP 64.0     DDimerNo results for input(s): "DDIMER" in the last 168 hours.   Radiology    No results found.  Cardiac Studies  Echo on 04/25/22  1. Left ventricular ejection fraction, by estimation, is 70 to 75%. The  left ventricle has hyperdynamic function. The left ventricle has no  regional wall motion abnormalities. There is mild left ventricular  hypertrophy. Left ventricular diastolic   parameters are consistent with Grade I diastolic dysfunction (impaired  relaxation).   2. Right ventricular systolic function is normal. The right ventricular  size is normal. Tricuspid regurgitation signal is inadequate for assessing  PA pressure. 3. Left atrial size was mildly dilated.   4. The mitral valve is grossly normal. Trivial mitral valve  regurgitation.   5. The aortic valve is tricuspid. There is mild calcification of the  aortic valve. Aortic valve regurgitation is not visualized. Aortic valve  sclerosis/calcification is present, without any evidence of aortic  stenosis. Aortic valve mean gradient  measures 12.0 mmHg.   6. The inferior vena cava is dilated in size with >50% respiratory  variability, suggesting right atrial pressure of 8 mmHg.    Assessment & Plan  Patient is a 66 year old female known to have OSA, liver cirrhosis secondary to [name] presented to the ER with hepatic encephalopathy.  Hospital course was complicated by new onset of A-fib with RVR.  Cardiology was consulted for the same. She was on diltiazem drip and currently transitioned to p.o. diltiazem in order to wean off the drip.  Her home medication p.o. metoprolol was continued.  #Atrial fibrillation with RVR, CV score 5 (telemetry HR: 70s, NSR) PLAN Rate control: Wean off the Cardizem drip and agree with starting p.o. diltiazem 30 mg every 6 hours.  Switch to diltiazem 180 mg extended release tablet upon discharge. Continue metoprolol tartarate, home medication. Rhythm control: Patient currently in normal sinus rhythm. No indication for rhythm control Systemic anticoagulation: Patient had 4 falls since June 2023. After shared decision making, patient opted not to be on anticoagulation for stroke prevention. Given risk of falls. She might be a good candidate for Watchman device implantation. Patient and daughter interested to know more about Watchman device. we will schedule EP referral. Risk factor  modification: Aggressive management of OSA.  Follow-up with pulmonary as outpatient.  #Midline wound/ADHF/HFpEF with LVEF 60%/NYHA class II-III/ACC AHA stage C, currently mild decompensated PLAN -Symptoms have improved. Can switch to p.o. Lasix 40 mg twice a day. -We will start SGLT2 inhibitors, Farxiga or Jardiance 10 mg once daily as outpatient if patient agreeable. -Cardiology outpatient follow-up.  I have spent a total of 35 minutes with patient reviewing chart , telemetry, EKGs, labs and examining patient as well as establishing an assessment and plan that was discussed with the patient.  > 50% of time was spent  in direct patient care.     [facility] will sign off.   Medication Recommendations: PO Cardizem 151m once daily, PO Metoprolol tartarate 242mBID Other recommendations (labs, testing, etc):  None Follow up as an outpatient:  Cardiology outpatient follow-up in one month and EP referral for Watchman.   Signed, ViChalmers GuestMD  04/27/2022, 11:45 AM

## 2022-04-27 NOTE — Progress Notes (Addendum)
Daily Progress Note   Patient Name: Yolanda [name]       Date: 04/27/2022 DOB: 1956-07-05  Age: 66 y.o. MRN#: [id number] Attending Physician: Roxan Hockey, MD Primary Care Physician: Celene Squibb, MD Admit Date: 04/19/2022 Length of Stay: 7 days  Reason for Consultation/Follow-up: Establishing goals of care  HPI/Patient Profile:  66 y.o. female  with past medical history of NASH liver cirrhosis, morbid obesity, PE not on AC, OSA, HTN, anxiety, depression, GERD, hypothyroidism and psoriatic arthritis presenting with progressive shortness of breath, disorientation, fatigue and nausea, and admitted for acute metabolic encephalopathy in the setting of hepatic encephalopathy, pneumonia and UTI.  CXR also raised concern for cardiomegaly with small pleural effusion and mixed interstitial and airspace opacities concerning for edema.  He was started on IV Lasix.  MRI brain was negative for acute finding.   Patient had acute respiratory distress the evening of 10/15.  CTA chest negative for PE but multifocal pneumonia and moderate right pleural effusion.  Antibiotics escalated.  Diuretics increased.  She was transferred to stepdown unit for BiPAP.     Patient developed severe abdominal pain the evening of 10/16.  KUB and CT abdomen and pelvis raised concern for ileus.  She also went into A-fib with RVR the morning of 10/17, and started on Cardizem drip.   Palliative medicine consulted for goals of care discussion.  Subjective:   Subjective: Chart Reviewed. Updates received. Patient Assessed. Created space and opportunity for patient  and family to explore thoughts and feelings regarding current medical situation.  Today's Discussion: Today I met the bedside with the patient, her other daughter Yolanda [name], her cousin, and her granddaughter.  We reviewed clinical progress from yesterday.  I answered questions related to her care.  The patient seems much more awake and alert than even yesterday,  only received Ativan last night for anxiety.  She is able to keep and maintain eye contact, appears oriented.  She appears to have capacity.  We discussed the previously completed by and signed MOST form with the patient and her daughter Yolanda [name].  The patient confirmed her choices, and Yolanda [name] understood.  We decided to sign the MOST form for her to be scanned into the system now that both daughters are aware of her wishes and she is confirmed them twice.  We discussed and celebrated her clinical progress over the past couple days.  I discussed that palliative medicine is available to follow-up for any needs.  I provided emotional and general support through therapeutic listening, empathy, sharing of stories, and other techniques. I answered all questions and addressed all concerns to the best of my ability.  I completed a MOST form today. The patient and family outlined their wishes for the following treatment decisions:  Cardiopulmonary Resuscitation: Attempt Resuscitation (CPR)  Medical Interventions: Full Scope of Treatment: Use intubation, advanced airway interventions, mechanical ventilation, cardioversion as indicated, medical treatment, IV fluids, etc, also provide comfort measures. Transfer to the hospital if indicated  Antibiotics: Antibiotics if indicated  IV Fluids: IV fluids if indicated  Feeding Tube: Feeding tube for a defined trial period     Review of Systems  Constitutional:  Positive for fatigue.  Respiratory:  Positive for shortness of breath (much improved).   Gastrointestinal:  Negative for abdominal pain, nausea and vomiting.    Objective:   Vital Signs:  BP 124/65   Pulse 78   Temp 97.9 F (36.6 C) (Axillary)   Resp 20   Ht 5' 4"  (1.626 m)  Wt 117.5 kg   LMP 07/11/2007 (Approximate)   SpO2 99%   BMI 44.46 kg/m   Physical Exam: Physical Exam Vitals and nursing note reviewed.  Constitutional:      General: She is not in acute distress.    Appearance:  She is obese. She is ill-appearing.  HENT:     Head: Normocephalic and atraumatic.  Cardiovascular:     Rate and Rhythm: Tachycardia present. Rhythm irregular.  Pulmonary:     Effort: Pulmonary effort is normal. No respiratory distress.  Abdominal:     General: Bowel sounds are normal.     Palpations: Abdomen is soft.     Comments: Rectal tube in place  Skin:    General: Skin is warm and dry.  Neurological:     General: No focal deficit present.     Mental Status: She is alert.  Psychiatric:        Mood and Affect: Mood normal.        Behavior: Behavior normal.     Palliative Assessment/Data: 30%   Assessment & Plan:   Impression: Present on Admission:  Acute metabolic encephalopathy  Hypothyroidism  UTI (urinary tract infection)  Asthma, chronic  Chronic respiratory failure with hypoxia (HCC)  Essential hypertension  Mixed hyperlipidemia  Hyperammonemia /Hepatic encephalopathy  Sleep apnea  Anxiety  Morbid obesity ([place])  66 year old female with acute on chronic health problems as described above.  We had a good discussion related to goals of care including CODE STATUS, wishes for feeding tube, prolonged artificial support, etc.  See detailed discussion above, MOST form to follow.  Overall she seems to be doing better today.  We discussed that further discussions may be necessary pending her evolution of her clinical picture.  I agreed to return tomorrow for further discussion with family and patient.  Overall prognosis guarded to poor.  SUMMARY OF RECOMMENDATIONS   Remain full code Continue full scope of care MOST form signed and scanned into EMR (see summary above) GOC document completed in [facility] PMT will follow peripherally Please let us know of any significant clinical changes or new palliative needs  Symptom Management:  Per primary team PMT is available to assist as needed  Code Status: Full code  Prognosis: Unable to determine  Discharge Planning: To  Be Determined  Discussed with: Patient, family, medical team, nursing team  Thank you for allowing [place] to participate in the care of Yolanda [name] PMT will continue to support holistically.  Time Total: 75 min  Visit consisted of counseling and education dealing with the complex and emotionally intense issues of symptom management and palliative care in the setting of serious and potentially life-threatening illness. Greater than 50%  of this time was spent counseling and coordinating care related to the above assessment and plan.  Walden Field, NP Palliative Medicine Team  Team Phone # [phone] (Nights/Weekends)  03/08/2021, 8:17 AM

## 2022-04-27 NOTE — Care Management Important Message (Signed)
Important Message  Patient Details  Name: Yolanda [name] MRN: [id number] Date of Birth: [birth year]   Medicare Important Message Given:  Yes     Tommy Medal 04/27/2022, 2:57 PM

## 2022-04-27 NOTE — TOC Progression Note (Signed)
Transition of Care [facility]) - Progression Note    Patient Details  Name: Yolanda [name] MRN: [id number] Date of Birth: 16-Sep-1955  [facility]) CM/SW Contact  Salome Arnt, [place] Phone Number: 04/27/2022, 11:35 AM  Clinical Narrative: LCSW spoke with pt's daughter, [name] at her request regarding d/c plan. She is aware home health PT/OT has been recommended and has no preference on agency. HHPT, OT, RN, SW, and aide referred and accepted by [facility] with [facility]. MD notified for orders. [name] also requests 3N1 through [facility]. Requested MD place order. TOC will follow up on DME prior to d/c. Discussed outpatient palliative and [facility] requests [facility]. Referral made.       Expected Discharge Plan: [place] Barriers to Discharge: Continued Medical Work up  Expected Discharge Plan and Services Expected Discharge Plan: [place] In-house Referral: Clinical Social Work Discharge Planning Services: CM Consult Post Acute Care Choice: [place] arrangements for the past 2 months: Single Family Home                           HH Arranged: RN, PT, OT, Social Work, Nurse's Aide [employer] Agency: [place] ([place]) Date [place]: 04/27/22 Time [place]: [place] Representative spoke with at [place]: [place] ([place]) Interventions    Readmission Risk Interventions    04/25/2022    1:27 PM 09/22/2021   11:13 AM  Readmission Risk Prevention Plan  Post Dischage Appt  Complete  Medication Screening  Complete  Transportation Screening Complete Complete  HRI or [place] Complete   Social Work Consult for [place] Planning/Counseling Complete   Palliative Care Screening Not Applicable   Medication Review Press photographer) Complete

## 2022-04-27 NOTE — Progress Notes (Signed)
Patient given discharge instructions, verbalized understanding. Home care services will be delivering the 3N1 to the home instead of the hospital room since the patient also needed some homefill supply tubing for her oxygen concentrator. Patient and family made aware. Patient taken down via wheelchair to be discharged home with her daughter.

## 2022-04-27 NOTE — TOC Transition Note (Addendum)
Transition of Care [facility]) - CM/SW Discharge Note   Patient Details  Name: [name] MRN: [id number] Date of Birth: 1955/10/25  Transition of Care [facility]) CM/SW Contact:  Boneta Lucks, RN Phone Number: 04/27/2022, 3:30 PM   Clinical Narrative:   Patient medically ready to discharge home. [facility] set up. Patient needs a 3N1.Patient needs for discharge home.  Referral sent to [facility] with [facility]. It will be delivered to the hospital room. RN updated.  Daughter's want to take patient to Outpatient PT. MD ordered.   Addendum: TOC called [employer] to make a service call to home for repair to oxygen and tubing. Since they are going to the home they will deliver the 3N1 to the home. RN updated.   Final next level of care: [place] Barriers to Discharge: Barriers Resolved   Patient Goals and CMS Choice Patient states their goals for this hospitalization and ongoing recovery are:: return home CMS Medicare.gov Compare Post Acute Care list provided to:: Patient Represenative (must comment) Choice offered to / list presented to : Adult Children  Discharge Placement           Name of family member notified: Daughter's at Bedside MD/TOC on phone Patient and family notified of of transfer: 04/27/22  Discharge Plan and Services In-house Referral: Clinical Social Work Discharge Planning Services: CM Consult Post Acute Care Choice: Home Health          DME Arranged: 3-N-1 DME Agency: [facility] Date DME Agency Contacted: 04/27/22 Time DME Agency Contacted: 1238 Representative spoke with at DME Agency: [place]: RN, PT, [place], Social Work, Nurse's Aide [place]: [place] ([place]) Date [place]: 04/27/22 Time [place]: 1135 Representative spoke with at [place]: [place] Readmission Risk Interventions    04/25/2022    1:27 PM 09/22/2021   11:13 AM  Readmission Risk Prevention Plan  Post Dischage Appt  Complete   Medication Screening  Complete  Transportation Screening Complete Complete  HRI or [place] Complete   Social Work Consult for [place] Planning/Counseling Complete   Palliative Care Screening Not Applicable   Medication Review Press photographer) Complete

## 2022-04-27 NOTE — Discharge Summary (Addendum)
Yolanda [name], is a 66 y.o. female  DOB 24-Aug-1955  MRN [id number].  Admission date:  04/19/2022  Admitting Physician  Mercy Riding, MD  Discharge Date:  04/27/2022   Primary MD  Celene Squibb, MD  Recommendations for primary care physician for things to follow:   1)Please take lactulose as prescribed to prevent further confusion /hepatic encephalopathy goal is 2 to 3 soft/mushy BMs, Ok to take additional dose if No BM in 24 hrs  2)Repeat CBC and BMP blood test within a week  3)Follow-up with cardiology to discuss further management of atrial fibrillation and possible evaluation for Watchman device which can help prevent strokes  4)Avoid ibuprofen/Advil/Aleve/Motrin/Goody Powders/Naproxen/BC powders/Meloxicam/Diclofenac/Indomethacin and other Nonsteroidal anti-inflammatory medications as these will make you more likely to bleed and can cause stomach ulcers, can also cause Kidney problems.   Admission Diagnosis  Hypokalemia [E87.6] Hypoalbuminemia [E88.09] History of cirrhosis [Z87.19] Acute cystitis without hematuria [N30.00] Chronic respiratory failure with hypoxia (HCC) [J96.11] Altered mental status, unspecified altered mental status type [T05.69] Acute metabolic encephalopathy [V94.80] Anemia, unspecified type [D64.9] Pneumonia of both lungs due to infectious organism, unspecified part of lung [J18.9]   Discharge Diagnosis  Hypokalemia [E87.6] Hypoalbuminemia [E88.09] History of cirrhosis [Z87.19] Acute cystitis without hematuria [N30.00] Chronic respiratory failure with hypoxia (HCC) [J96.11] Altered mental status, unspecified altered mental status type [X65.53] Acute metabolic encephalopathy [Z48.27] Anemia, unspecified type [D64.9] Pneumonia of both lungs due to infectious organism, unspecified part of lung [J18.9]    Principal Problem:   Acute metabolic encephalopathy Active  Problems:   Hyperammonemia /Hepatic encephalopathy   Sleep apnea   UTI (urinary tract infection)   Mixed hyperlipidemia   Anxiety and depression   Essential hypertension   GERD (gastroesophageal reflux disease)   Obesity, Class III, BMI 40-49.9 (morbid obesity) (HCC)   Hypothyroidism   Anxiety   Chronic respiratory failure with hypoxia (HCC)   Asthma, chronic   Morbid obesity (HCC)   Acute on chronic diastolic CHF (congestive heart failure) (HCC)   Malnutrition of moderate degree   Paroxysmal atrial fibrillation with RVR ([place])   Ileus (HCC)      Past Medical History:  Diagnosis Date   Abnormal uterine bleeding    Anxiety    Arthritis    Chronic abdominal pain    Chronic pain in left foot    Depression    Elevated liver function tests 2018   Endometriosis    Fibromyalgia    GERD 12/21/2009   Qualifier: Diagnosis of  By: Craige Cotta     Hypertension    stopped meds in Aug 2015   Hypothyroidism    IBS (irritable bowel syndrome)    Internal hemorrhoids    Kidney stone    Liver cirrhosis secondary to NASH (HCC)    Obesity, morbid (HCC)    PONV (postoperative nausea and vomiting)    Psoriatic arthritis ([place])    Pulmonary embolism ([place]) 12/2005   Qualifier: Diagnosis of  By: Kellie Simmering LPN, Almyra Free     Rheumatoid arthritis (  [place])    Sleep apnea    Vitamin D deficiency     Past Surgical History:  Procedure Laterality Date   ABDOMINAL SURGERY     laparoscopy   BIOPSY N/A 03/24/2014   Procedure: GASTRIC BIOPSIES;  Surgeon: Danie Binder, MD;  Location: [facility];  Service: Endoscopy;  Laterality: N/A;   BIOPSY  10/28/2019   Procedure: BIOPSY;  Surgeon: Danie Binder, MD;  Location: [facility];  Service: Endoscopy;;  gastric nodule   BREAST LUMPECTOMY Right    CESAREAN SECTION     X2   COLONOSCOPY  2006   internal hemorrhoids   COLONOSCOPY WITH PROPOFOL N/A 03/24/2014   Dr. [name]: 2 tubular adenomas removed, hemorrhoids   COLONOSCOPY WITH PROPOFOL N/A 10/28/2019    Fields: External and internal hemorrhoids, 8 polyps ranging from 2 to 5 mm in size removed from the colon.  Multiple tubular adenomas.  Next colonoscopy in 3 years.   CYSTOSCOPY W/ RETROGRADES  01/23/2012   Procedure: CYSTOSCOPY WITH RETROGRADE PYELOGRAM;  Surgeon: Marissa Nestle, MD;  Location: [facility];  Service: Urology;  Laterality: Left;   DILATATION & CURETTAGE/HYSTEROSCOPY WITH MYOSURE N/A 11/18/2014   Procedure: DILATATION & CURETTAGE/HYSTEROSCOPY WITH MYOSURE, resection of polyp;  Surgeon: Cheri Fowler, MD;  Location: [facility];  Service: Gynecology;  Laterality: N/A;   DILATION AND CURETTAGE OF UTERUS     ESOPHAGOGASTRODUODENOSCOPY   08/24/2006   Dr. Veto Kemps erythema of the antrum without erosion or ulcers/Otherwise, normal esophagus without evidence of Barrett's path with chronic gastritis   ESOPHAGOGASTRODUODENOSCOPY (EGD) WITH PROPOFOL N/A 03/24/2014   Dr. [name]: gastritis   ESOPHAGOGASTRODUODENOSCOPY (EGD) WITH PROPOFOL N/A 10/28/2019   Fields: Esophagus appeared normal, empiric dilation due to history of dysphagia.  6 mm nodule seen in the gastric cardia.  Mild portal hypertensive gastropathy.  Nodule from the stomach biopsied and showed mild chronic gastritis, no H. pylori.   POLYPECTOMY N/A 03/24/2014   Procedure: POLYPECTOMY;  Surgeon: Danie Binder, MD;  Location: [facility];  Service: Endoscopy;  Laterality: N/A;   POLYPECTOMY  10/28/2019   Procedure: POLYPECTOMY;  Surgeon: Danie Binder, MD;  [facility];  Service: Endoscopy;;  hepatic flexure, ascending colon,sigmoid colon, rectal   REMOVAL OF STONES  01/23/2012   Procedure: REMOVAL OF STONES;  Surgeon: Marissa Nestle, MD;  Location: [facility];  Service: Urology;  Laterality: N/A;   SAVORY DILATION N/A 10/28/2019   Procedure: SAVORY DILATION;  Surgeon: Danie Binder, MD;  [facility];  Service: Endoscopy;  Laterality: N/A;   TUBAL LIGATION       HPI  from the history and physical done on the day  of admission:     HPI: Yolanda [name] is a 66 y.o. female with medical history significant of anxiety, depression, GERD, hypertension, hypothyroidism, liver cirrhosis secondary to [place], obesity, pulmonary embolism, sleep apnea, and more presents the ED with chief complaint of difficulty breathing and disoriented.  Patient reports that her daughter says her symptoms have been gradually progressing over 2 weeks.  Patient noticed an acute change this morning upon waking.  She remembers being confused.  She reports the orthopnea is normal for her.  She continues to have to prop himself up at night with otherwise having shortness of breath.  She denies any new cough or fever.  Patient reports that she has had some chest pain from right to left.  It is sharp when she is across her chest.  Today  it was mostly on the right side.  It is only there for a few seconds at a time.  Patient reports that she feels dizzy.  She had some nausea but no vomiting.  She reports that she felt generally ill and washed out yesterday.   Patient reports that she supposed to take her lactulose twice a day but she has been taking his once a day.  Apparently when the daughter was here she was reporting to staff that 7 days patient is needing to take her lactulose.  Patient reports that she cannot tell us when her last bowel was because she cannot remember.   Patient does not smoke, drink, use illicit drugs.  She is vaccinated for COVID.  Patient is full code. Review of Systems: As mentioned in the history of present illness. All other systems reviewed and are negative.     Hospital Course:   Brief Narrative / Interim history: 66 year old F with PMH of NASH liver cirrhosis, morbid obesity, PE not on AC, OSA, HTN, anxiety, depression, GERD, hypothyroidism and psoriatic arthritis presenting with progressive shortness of breath, disorientation, fatigue and nausea, and admitted for acute metabolic encephalopathy in the setting of  hepatic encephalopathy, pneumonia and UTI.  CXR also raised concern for cardiomegaly with small pleural effusion and mixed interstitial and airspace opacities concerning for edema.  He was started on IV Lasix.  MRI brain was negative for acute finding.   Patient had acute respiratory distress the evening of 10/15.  CTA chest negative for PE but multifocal pneumonia and moderate right pleural effusion.  Antibiotics escalated.  Diuretics increased.  She was transferred to stepdown unit for BiPAP.  IR consulted but right pleural effusion felt to be insufficient for thoracocentesis.  Palliative medicine consulted for goals of care discussion.   Patient developed severe abdominal pain the evening of 10/16.  KUB and CT abdomen and pelvis raised concern for ileus.  She also went into A-fib with RVR the morning of 10/17, and started on Cardizem drip.  Assessment and Plan:  1)Acute respiratory distress/chronic hypoxic RF on 3 L-/OSA/??OHSmultifactorial including pneumonia, possible diastolic CHF, pleural effusion, asthma and underlying OSA/possible OHS.   --Fluid status and breathing seems to have improved.  --CTA negative for PE but multifocal pneumonia and effusion.  - Cardiac enzymes and EKG negative. -Continue BiPAP as needed and when she sleeps -Prior to admission patient had a CPAP machine at home plan was to get another sleep study with titration to see if she needs a pressure change, or if she needs a BiPAP -Follow-up for outpatient sleep test/study with BiPAP titration as previously scheduled for 05/01/2022   2)Acute diastolic CHF: Had PND, orthopnea and DOE for months UA. Last TTE in 11/2021 with LVEF of 65 to 70% and indeterminate DD.  Difficult to assess fluid status due to body habitus.  CT chest on 10/15 with moderate right pleural effusion and some vascular congestion despite IV Lasix.   -Fluid status seems to have improved significantly after additional diuresis with IV Lasix. -Continue BiPAP  as needed and at night. -Cardiology input appreciated -Discharged on p.o. Lasix 40 mg daily and Aldactone 50 mg daily----diuretics can be adjusted as outpatient based on clinical improvement and after further renal function check   3)Multifocal pneumonia: CTA chest with multifocal pneumonia and moderate right and mild left pleural effusion.   -Pro-Cal mildly elevated to 0.35>> 0.31.  MRSA PCR negative. -Patient was initially treated with no complaints seen and Flagyl, later changed to cefepime  and doxycycline -Respiratory status improved significantly, oxygen requirement is back to baseline of 2 to 3 L/min -Okay to discharge on Augmentin and doxycycline   4)A-fib with RVR: Likely provoked acute illness and aggressive diuresis ?  No history of A-fib. -Hold off anticoagulation for now given coagulopathy-in the setting of liver cirrhosis -Cardiology consult appreciated -Patient has converted back to sinus rhythm at this time -Weaned off IV Cardizem -After further conversations with family cardiology recommends against anticoagulation at this time -Discharge home on Cardizem CD 120 mg daily and metoprolol 25 mg twice daily -Cardizem and metoprolol can be titrated up as outpatient as BP allows   5)Acute metabolic encephalopathy: Multifactorial including hepatic encephalopathy and infection. TSH and B12 within normal.   -Overall much improved with lactulose enema -Switched to p.o. lactulose -Mentation appears back to baseline -Okay to discharge home on oral lactulose   6)Abdominal pain/ileus: Was not on opiate or meds with anticholinergic effect.   ---Bowel function improved with lactulose patient having multiple BMs with lactulose -No nausea no vomiting tolerating oral intake -Having lots of bowel movement with lactulose enemas -Tolerating oral intake well and having BMs no further concerns about ileus at this time   7)NASH cirrhosis/elevated AST/hyperbilirubinemia/coagulopathy:  Stable. -Continue lactulose for elevated ammonia. -Hold home rifaximin while on antibiotics   8)Enterobacter aerogenes Urinary tract infection:   Completed appropriate antibiotic course.    9)Hypokalemia/hypomagnesemia:  -Replaced and normalized   10)Hypothyroidism: TSH within normal. -Continue home Synthroid   11)GERD -Continue Protonix   12)Mixed hyperlipidemia -Continue home Crestor   13)Generalized weakness/physical deconditioning -PT/OT eval   14)Normocytic anemia/thrombocytopenia: Likely due to liver cirrhosis.  Anemia panel suggests anemia of chronic disease.  -Hgb greater than 9 -Platelets have normalized    15)Goal of care counseling: Remains full code. -See IPAL note on 10/16 -Palliative medicine requested/appreciated   16)Morbid Obesity- -Low calorie diet, portion control and increase physical activity discussed with patient -Body mass index is 44.46 kg/m.  Discharge Condition: Stable, oxygen requirement is back to baseline, mentation is back to baseline  Follow UP   Follow-up Information     [facility] Follow [facility].   Specialty: Home Health Services Why: Will contact you to schedule home health visits.        [facility] Follow up.   Why: For outpatient palliative Contact information: [address] [place] [address] [phone]         Chalmers Guest, MD Follow up on 05/23/2022.   Specialties: Cardiology, Internal Medicine Why: Cardiology Hospital Follow-up on 05/23/2022 at 11:30 AM. Contact information: [address] [place] [address] [phone]                 Consults obtained -cardiology  Diet and Activity recommendation:  As advised  Discharge Instructions    Discharge Instructions     Ambulatory referral to Physical Therapy   Complete by: As directed    Iontophoresis - 4 mg/ml of dexamethasone: No   T.E.N.S. Unit Evaluation and Dispense as Indicated: No   Call MD  for:  difficulty breathing, headache or visual disturbances   Complete by: As directed    Call MD for:  persistant dizziness or light-headedness   Complete by: As directed    Call MD for:  persistant nausea and vomiting   Complete by: As directed    Call MD for:  severe uncontrolled pain   Complete by: As directed    Call MD for:  temperature >  100.4   Complete by: As directed    Diet - low sodium heart healthy   Complete by: As directed    Diet Carb Modified   Complete by: As directed    Discharge instructions   Complete by: As directed    1)Please take lactulose as prescribed to prevent further confusion /hepatic encephalopathy goal is 2 to 3 soft/mushy BMs, Ok to take additional dose if No BM in 24 hrs  2)Repeat CBC and BMP blood test within a week  3)Follow-up with cardiology to discuss further management of atrial fibrillation and possible evaluation for Watchman device which can help prevent strokes  4)Avoid ibuprofen/Advil/Aleve/Motrin/Goody Powders/Naproxen/BC powders/Meloxicam/Diclofenac/Indomethacin and other Nonsteroidal anti-inflammatory medications as these will make you more likely to bleed and can cause stomach ulcers, can also cause Kidney problems.   Increase activity slowly   Complete by: As directed         Discharge Medications     Allergies as of 04/27/2022       Reactions   Azithromycin Hives   Lisinopril Cough   Morphine Hives        Medication List     STOP taking these medications    HYDROcodone-acetaminophen 5-325 MG tablet Commonly known as: NORCO/VICODIN   olmesartan-hydrochlorothiazide 40-12.5 MG tablet Commonly known as: Benicar HCT   omeprazole 20 MG capsule Commonly known as: PRILOSEC       TAKE these medications    acetaminophen 325 MG tablet Commonly known as: TYLENOL Take 2 tablets (650 mg total) by mouth every 6 (six) hours as needed for mild pain, headache or fever (or Fever >/= 101).   albuterol 108 (90 Base)  MCG/ACT inhaler Commonly known as: VENTOLIN HFA Inhale 1-2 puffs into the lungs every 6 (six) hours as needed for wheezing or shortness of breath.   amoxicillin-clavulanate 875-125 MG tablet Commonly known as: AUGMENTIN Take 1 tablet by mouth 2 (two) times daily for 5 days.   aspirin EC 81 MG tablet Take 81 mg by mouth daily. Swallow whole.   blood glucose meter kit and supplies Dispense based on patient and insurance preference. Use up to four times daily as directed. (FOR ICD-10 E10.9, E11.9).   budesonide-formoterol 80-4.5 MCG/ACT inhaler Commonly known as: Symbicort Take 2 puffs first thing in am and then another 2 puffs about 12 hours later.   CALCIUM 600 + D PO Take by mouth.   clotrimazole 10 MG troche Commonly known as: MYCELEX Take 10 mg by mouth 5 (five) times daily.   diltiazem 120 MG 24 hr capsule Commonly known as: Cardizem CD Take 1 capsule (120 mg total) by mouth daily.   doxycycline 100 MG tablet Commonly known as: VIBRA-TABS Take 1 tablet (100 mg total) by mouth 2 (two) times daily for 5 days.   DULoxetine 60 MG capsule Commonly known as: CYMBALTA Take 60 mg by mouth daily.   furosemide 40 MG tablet Commonly known as: Lasix Take 1 tablet (40 mg total) by mouth daily.   lactulose (encephalopathy) 10 GM/15ML Soln Commonly known as: CHRONULAC Take 30 mLs (20 g total) by mouth 2 (two) times daily. Goal is 2 to 3 soft/mushy BMs, Ok to take additional dose if No BM in 24 hrs What changed:  when to take this additional instructions   levothyroxine 75 MCG tablet Commonly known as: SYNTHROID Take 1 tablet by mouth daily.   meclizine 25 MG tablet Commonly known as: ANTIVERT Take 25 mg by mouth 3 (three) times daily as needed for  dizziness.   Melatonin 10 MG Caps Take 10 mg by mouth at bedtime as needed (sleep).   metFORMIN 500 MG 24 hr tablet Commonly known as: GLUCOPHAGE-XR Take 1 tablet (500 mg total) by mouth 2 (two) times daily with a  meal. What changed:  how much to take how to take this when to take this additional instructions   metoprolol tartrate 25 MG tablet Commonly known as: LOPRESSOR Take 1 tablet (25 mg total) by mouth 2 (two) times daily.   midodrine 5 MG tablet Commonly known as: PROAMATINE Take 1 tablet (5 mg total) by mouth 2 (two) times daily with a meal.   pantoprazole 40 MG tablet Commonly known as: PROTONIX Take 40 mg by mouth 2 (two) times daily.   pilocarpine 5 MG tablet Commonly known as: SALAGEN Take 5 mg by mouth 2 (two) times daily.   potassium chloride 10 MEQ tablet Commonly known as: KLOR-CON Take 1 tablet (10 mEq total) by mouth daily. Take While taking Lasix/furosemide   REMICADE IV Inject into the vein. Q three months per patient.   rifaximin 550 MG Tabs tablet Commonly known as: XIFAXAN Take 1 tablet (550 mg total) by mouth 2 (two) times daily.   rosuvastatin 10 MG tablet Commonly known as: CRESTOR Take 10 mg by mouth daily.   spironolactone 50 MG tablet Commonly known as: Aldactone Take 1 tablet (50 mg total) by mouth daily.   sucralfate 1 g tablet Commonly known as: CARAFATE Take 1 g by mouth 4 (four) times daily.   Vitamin D3 50 MCG (2000 UT) Tabs Take 2,000 Units by mouth daily.        Major procedures and Radiology Reports - PLEASE review detailed and final reports for all details, in brief -   ECHOCARDIOGRAM COMPLETE  Result Date: 04/25/2022    ECHOCARDIOGRAM REPORT   Patient Name:   Yolanda [name] Date of Exam: 04/24/2022 Medical Rec #:  [id number]          Height:       64.0 in Accession #:    [id number]         Weight:       259.9 lb Date of Birth:  17-Aug-1955         BSA:          2.187 m Patient Age:    72 years           BP:           157/73 mmHg Patient Gender: F                  HR:           103 bpm. Exam Location:  [facility] Procedure: 2D Echo, Cardiac Doppler and Color Doppler Indications:    CHF-Acute Diastolic B76.28  History:         Patient has prior history of Echocardiogram examinations, most                 recent 12/07/2021. Stroke; Risk Factors:Hypertension,                 Dyslipidemia and Former Smoker. Morbid Obesity.  Sonographer:    Alvino Chapel RCS Referring Phys: [id number] Charlesetta Ivory GONFA IMPRESSIONS  1. Left ventricular ejection fraction, by estimation, is 70 to 75%. The left ventricle has hyperdynamic function. The left ventricle has no regional wall motion abnormalities. There is mild left ventricular hypertrophy. Left ventricular diastolic parameters are consistent with Grade I  diastolic dysfunction (impaired relaxation).  2. Right ventricular systolic function is normal. The right ventricular size is normal. Tricuspid regurgitation signal is inadequate for assessing PA pressure.  3. Left atrial size was mildly dilated.  4. The mitral valve is grossly normal. Trivial mitral valve regurgitation.  5. The aortic valve is tricuspid. There is mild calcification of the aortic valve. Aortic valve regurgitation is not visualized. Aortic valve sclerosis/calcification is present, without any evidence of aortic stenosis. Aortic valve mean gradient measures 12.0 mmHg.  6. The inferior vena cava is dilated in size with >50% respiratory variability, suggesting right atrial pressure of 8 mmHg. Comparison(s): No significant change from prior study. Prior images reviewed side by side. FINDINGS  Left Ventricle: Left ventricular ejection fraction, by estimation, is 70 to 75%. The left ventricle has hyperdynamic function. The left ventricle has no regional wall motion abnormalities. Definity contrast agent was given IV to delineate the left ventricular endocardial borders. The left ventricular internal cavity size was normal in size. There is mild left ventricular hypertrophy. Left ventricular diastolic parameters are consistent with Grade I diastolic dysfunction (impaired relaxation). Right Ventricle: The right ventricular size is normal. No increase  in right ventricular wall thickness. Right ventricular systolic function is normal. Tricuspid regurgitation signal is inadequate for assessing PA pressure. Left Atrium: Left atrial size was mildly dilated. Right Atrium: Right atrial size was normal in size. Pericardium: There is no evidence of pericardial effusion. Presence of epicardial fat layer. Mitral Valve: The mitral valve is grossly normal. Mild mitral annular calcification. Trivial mitral valve regurgitation. Tricuspid Valve: The tricuspid valve is grossly normal. Tricuspid valve regurgitation is trivial. Aortic Valve: The aortic valve is tricuspid. There is mild calcification of the aortic valve. Aortic valve regurgitation is not visualized. Aortic valve sclerosis/calcification is present, without any evidence of aortic stenosis. Aortic valve mean gradient measures 12.0 mmHg. Aortic valve peak gradient measures 19.7 mmHg. Aortic valve area, by VTI measures 2.03 cm. Pulmonic Valve: The pulmonic valve was grossly normal. Pulmonic valve regurgitation is trivial. Aorta: The aortic root is normal in size and structure. Venous: The inferior vena cava is dilated in size with greater than 50% respiratory variability, suggesting right atrial pressure of 8 mmHg. IAS/Shunts: No atrial level shunt detected by color flow Doppler.  LEFT VENTRICLE PLAX 2D LVIDd:         4.50 cm LVIDs:         2.20 cm LV PW:         1.20 cm LV IVS:        1.20 cm LVOT diam:     1.90 cm LV SV:         89 LV SV Index:   41 LVOT Area:     2.84 cm  RIGHT VENTRICLE RV S prime:     22.20 cm/s TAPSE (M-mode): 2.3 cm LEFT ATRIUM             Index        RIGHT ATRIUM           Index LA diam:        3.50 cm 1.60 cm/m   RA Area:     14.90 cm LA Vol (A2C):   59.5 ml 27.21 ml/m  RA Volume:   38.20 ml  17.47 ml/m LA Vol (A4C):   82.2 ml 37.59 ml/m LA Biplane Vol: 71.6 ml 32.74 ml/m  AORTIC VALVE AV Area (Vmax):    2.03 cm AV Area (Vmean):   1.98 cm  AV Area (VTI):     2.03 cm AV Vmax:            222.00 cm/s AV Vmean:          159.000 cm/s AV VTI:            0.438 m AV Peak Grad:      19.7 mmHg AV Mean Grad:      12.0 mmHg LVOT Vmax:         159.00 cm/s LVOT Vmean:        111.000 cm/s LVOT VTI:          0.314 m LVOT/AV VTI ratio: 0.72  AORTA Ao Root diam: 3.20 cm MITRAL VALVE MV Area (PHT): 2.83 cm     SHUNTS MV Decel Time: 268 msec     Systemic VTI:  0.31 m MV E velocity: 132.00 cm/s  Systemic Diam: 1.90 cm MV A velocity: 163.00 cm/s MV E/A ratio:  0.81 Rozann Lesches MD Electronically signed by Rozann Lesches MD Signature Date/Time: 04/25/2022/10:50:55 AM    Final    CT ABDOMEN PELVIS W CONTRAST  Result Date: 04/24/2022 CLINICAL DATA:  Abdominal pain, acute, nonlocalized EXAM: CT ABDOMEN AND PELVIS WITH CONTRAST TECHNIQUE: Multidetector CT imaging of the abdomen and pelvis was performed using the standard protocol following bolus administration of intravenous contrast. RADIATION DOSE REDUCTION: This exam was performed according to the departmental dose-optimization program which includes automated exposure control, adjustment of the mA and/or kV according to patient size and/or use of iterative reconstruction technique. CONTRAST:  50m OMNIPAQUE IOHEXOL 300 MG/ML  SOLN COMPARISON:  09/27/2017 FINDINGS: Lower chest: Small bilateral pleural effusions, right greater than left. Airspace opacity in the right lower lobe. Cannot exclude pneumonia. Hepatobiliary: Nodular, shrunken liver compatible with cirrhosis. Gallbladder unremarkable. No focal hepatic abnormality. Pancreas: No focal abnormality or ductal dilatation. Spleen: No focal abnormality.  Normal size. Adrenals/Urinary Tract: Urinary bladder decompressed with Foley catheter in place. No stones or hydronephrosis. No renal or adrenal mass. Stomach/Bowel: Gaseous distension of the colon with moderate liquid stool. Stomach and small bowel decompressed. Vascular/Lymphatic: Scattered aortic calcifications. No evidence of aneurysm or adenopathy.  Reproductive: Uterus and adnexa unremarkable.  No mass. Other: No free fluid or free air. Musculoskeletal: No acute bony abnormality. IMPRESSION: Changes of cirrhosis. Gaseous distention of the colon with liquid stool. This may reflect ileus. Small bilateral pleural effusions, right greater than left. Right lower lobe airspace opacity could reflect pneumonia. Scattered aortic atherosclerosis. Electronically Signed   By: KRolm BaptiseM.D.   On: 04/24/2022 21:32   DG Abd Portable 1V  Result Date: 04/24/2022 CLINICAL DATA:  Abdominal pain EXAM: PORTABLE ABDOMEN - 1 VIEW COMPARISON:  MRI abdomen 01/07/2022 FINDINGS: Large and small bowel is diffusely distended with air. Both colon and small bowel loops are borderline dilated. Air seen to the level the rectum. No suspicious calcifications are seen. No acute fractures are identified. There is a small right pleural effusion. There are degenerative changes of both hips. IMPRESSION: Diffuse gaseous distension of large and small bowel loops. Air seen to the level of the rectum. Findings may represent ileus. Consider further evaluation with CT of the abdomen and pelvis with contrast. Electronically Signed   By: ARonney AstersM.D.   On: 04/24/2022 19:37   [place]ST (PLEURAL EFFUSION)  Result Date: 04/24/2022 CLINICAL DATA:  Request for thoracentesis EXAM: CHEST ULTRASOUND COMPARISON:  CT of 1 day prior FINDINGS: Focused ultrasound demonstrates a small to moderate right-sided pleural effusion. This is  felt to be insufficient for safe thoracentesis, given patient's size. IMPRESSION: Small to moderate right-sided pleural effusion. Felt to be insufficient for thoracentesis. Electronically Signed   By: Abigail Miyamoto M.D.   On: 04/24/2022 10:00   CT Angio Chest Pulmonary Embolism (PE) W or WO Contrast  Result Date: 04/23/2022 CLINICAL DATA:  Shortness of breath x1 week EXAM: CT ANGIOGRAPHY CHEST WITH CONTRAST TECHNIQUE: Multidetector CT imaging of the chest was performed  using the standard protocol during bolus administration of intravenous contrast. Multiplanar CT image reconstructions and MIPs were obtained to evaluate the vascular anatomy. RADIATION DOSE REDUCTION: This exam was performed according to the departmental dose-optimization program which includes automated exposure control, adjustment of the mA and/or kV according to patient size and/or use of iterative reconstruction technique. CONTRAST:  2m OMNIPAQUE IOHEXOL 350 MG/ML SOLN COMPARISON:  Chest radiograph dated 04/23/2022. CTA chest dated 09/20/2021. FINDINGS: Cardiovascular: Satisfactory opacification of the bilateral pulmonary arteries to the lobar level. Evaluation is mildly constrained by respiratory motion. Within that constraint, there is no evidence of pulmonary embolism. Although not tailored for evaluation of the thoracic aorta, there is no evidence thoracic aortic aneurysm or dissection. The heart is normal in size.  No pericardial effusion. Mediastinum/Nodes: No suspicious mediastinal lymphadenopathy. Visualized thyroid is unremarkable. Lungs/Pleura: Evaluation of the lung parenchyma is constrained by respiratory motion. Within that constraint, there are no suspicious pulmonary nodules. Multifocal interstitial/patchy opacities in the lungs bilaterally, right lung predominant, favoring multifocal pneumonia with possible underlying mild interstitial edema. Moderate right and small left pleural effusions. Associated right lower lobe and left basilar atelectasis. No pneumothorax. Upper Abdomen: Visualized upper abdomen is motion degraded but notable for cirrhosis. Musculoskeletal: Mild degenerative changes of the mid thoracic spine. Review of the MIP images confirms the above findings. IMPRESSION: No evidence of pulmonary embolism. Multifocal pneumonia with possible underlying mild interstitial edema. Moderate right and small left pleural effusions. Electronically Signed   By: SJulian HyM.D.   On:  04/23/2022 17:05   DG Chest Port 1 View  Result Date: 04/23/2022 CLINICAL DATA:  Shortness of breath EXAM: PORTABLE CHEST 1 VIEW COMPARISON:  Prior chest x-ray 04/21/2022 FINDINGS: Progressive right-sided pleural effusion with associated atelectasis. Persistent and unchanged small left pleural effusion. Background cardiomegaly is similar compared to prior. Slightly increased pulmonary vascular congestion. No pneumothorax. IMPRESSION: 1. Increased pulmonary vascular congestion bordering on mild edema. 2. Enlarging right-sided pleural effusion with increased atelectasis. 3. Stable tiny left pleural effusion. Electronically Signed   By: HJacqulynn CadetM.D.   On: 04/23/2022 08:23   DG Chest 2 View  Result Date: 04/21/2022 CLINICAL DATA:  Shortness of breath, pulmonary edema EXAM: CHEST - 2 VIEW COMPARISON:  04/19/2022 FINDINGS: Heart is mildly enlarged. Continued mild interstitial and airspace opacities with small bilateral effusions. No real change since prior study. No acute bony abnormality. IMPRESSION: Continued cardiomegaly with bilateral interstitial and airspace opacities, favor edema. Small bilateral effusions. Electronically Signed   By: KRolm BaptiseM.D.   On: 04/21/2022 10:31   MR BRAIN WO CONTRAST  Result Date: 04/19/2022 CLINICAL DATA:  Delirium. EXAM: MRI HEAD WITHOUT CONTRAST TECHNIQUE: Multiplanar, multiecho pulse sequences of the brain and surrounding structures were obtained without intravenous contrast. COMPARISON:  Head MRI 12/07/2021 FINDINGS: Brain: There is no evidence of an acute infarct, intracranial hemorrhage, mass, midline shift, or extra-axial fluid collection. The ventricles and sulci are within normal limits for age. T2 hyperintensities in the cerebral white matter bilaterally are unchanged and nonspecific but compatible with mild  chronic small vessel ischemic disease. Vascular: Major intracranial vascular flow voids are preserved. Skull and upper cervical spine:  Unremarkable bone marrow signal para Sinuses/Orbits: Unremarkable orbits. Paranasal sinuses and mastoid air cells are clear. Other: None. IMPRESSION: 1. No acute intracranial abnormality. 2. Mild chronic small vessel ischemic disease. Electronically Signed   By: Logan Bores M.D.   On: 04/19/2022 19:07   DG Chest Portable 1 View  Result Date: 04/19/2022 CLINICAL DATA:  Altered mental status. EXAM: PORTABLE CHEST 1 VIEW COMPARISON:  Chest radiographs 01/02/2022 FINDINGS: The cardiac silhouette remains mildly enlarged. There is persistent pulmonary vascular congestion with increased interstitial densities bilaterally as well as new patchy airspace opacities in the right greater than left lower lungs. There are persistent small left and new small right pleural effusions. No pneumothorax is identified. No acute osseous abnormality is seen. IMPRESSION: Cardiomegaly with small pleural effusions and mixed interstitial and air space opacities which may reflect edema versus pneumonia. Electronically Signed   By: Logan Bores M.D.   On: 04/19/2022 17:30    Micro Results   Recent Results (from the past 240 hour(s))  Blood culture (routine x 2)     Status: None   Collection Time: 04/19/22  5:09 PM   Specimen: Left Antecubital; Blood  Result Value Ref Range Status   Specimen Description LEFT ANTECUBITAL  Final   Special Requests   Final    BOTTLES DRAWN AEROBIC AND ANAEROBIC Blood Culture adequate volume   Culture   Final    NO GROWTH 5 DAYS Performed at [facility], [address], [place], [place] [address]    Report Status 04/24/2022 FINAL  Final  Blood culture (routine x 2)     Status: None   Collection Time: 04/19/22  5:20 PM   Specimen: BLOOD RIGHT ARM  Result Value Ref Range Status   Specimen Description BLOOD RIGHT ARM  Final   Special Requests   Final    BOTTLES DRAWN AEROBIC AND ANAEROBIC Blood Culture adequate volume   Culture   Final    NO GROWTH 5 DAYS Performed at [facility],  [address], [place], [place] [address]    Report Status 04/24/2022 FINAL  Final  Urine Culture     Status: Abnormal   Collection Time: 04/19/22  7:27 PM   Specimen: Urine, Clean Catch  Result Value Ref Range Status   Specimen Description   Final    URINE, CLEAN CATCH Performed at [facility], [address], [place], [place] [address]    Special Requests   Final    NONE Performed at [facility], [address], [place], [place] [address]    Culture >=100,000 COLONIES/mL ENTEROBACTER AEROGENES (A)  Final   Report Status 04/22/2022 FINAL  Final   Organism ID, Bacteria ENTEROBACTER AEROGENES (A)  Final      Susceptibility   Enterobacter aerogenes - MIC*    CEFAZOLIN >=64 RESISTANT Resistant     CEFEPIME <=0.12 SENSITIVE Sensitive     CEFTRIAXONE <=0.25 SENSITIVE Sensitive     CIPROFLOXACIN <=0.25 SENSITIVE Sensitive     GENTAMICIN <=1 SENSITIVE Sensitive     IMIPENEM 0.5 SENSITIVE Sensitive     NITROFURANTOIN 64 INTERMEDIATE Intermediate     TRIMETH/SULFA <=20 SENSITIVE Sensitive     PIP/TAZO <=4 SENSITIVE Sensitive     * >=100,000 COLONIES/mL ENTEROBACTER AEROGENES  MRSA Next Gen by PCR, Nasal     Status: None   Collection Time: 04/23/22  7:00 PM  Result Value Ref  Range Status   MRSA by PCR Next Gen NOT DETECTED NOT DETECTED Final    Comment: (NOTE) The GeneXpert MRSA Assay (FDA approved for NASAL specimens only), is one component of a comprehensive MRSA colonization surveillance program. It is not intended to diagnose MRSA infection nor to guide or monitor treatment for MRSA infections. Test performance is not FDA approved in patients less than 40 years old. Performed at [facility], [address], [place], [place] [address]    Today   Subjective    Yolanda [name] today has no new complaints -Eating and drinking well -Having bowel movements with lactulose -Patient's daughter is at bedside, questions answered - -oxygen requirement is back to baseline,  -mentation is  back to baseline          Patient has been seen and examined prior to discharge   Objective   Blood pressure (!) 124/58, pulse 73, temperature 98.1 F (36.7 C), temperature source Oral, resp. rate (!) 23, height 5' 4"  (1.626 m), weight 117.5 kg, last menstrual period 07/11/2007, SpO2 98 %.   Intake/Output Summary (Last 24 hours) at 04/27/2022 1647 Last data filed at 04/27/2022 0000 Gross per 24 hour  Intake 840.13 ml  Output 0 ml  Net 840.13 ml   Exam Gen:- Awake Alert, no acute distress, morbidly obese, cooperative and speaking in complete sentences HEENT:- [place].AT, No sclera icterus Nose- [place] 3L/min Neck-Supple Neck,No JVD,.  Lungs-improved air movement, no wheezing  CV- S1, S2 normal, regular Abd-  +ve B.Sounds, Abd Soft, No tenderness, increased truncal adiposity noted Extremity/Skin:- No  edema,   good pulses Psych-affect is appropriate, oriented x3, mentation is back to baseline Neuro-generalized weakness, no new focal deficits, no tremors    Data Review   CBC w Diff:  Lab Results  Component Value Date   WBC 8.4 04/27/2022   HGB 9.1 (L) 04/27/2022   HGB 13.2 01/02/2022   HCT 28.3 (L) 04/27/2022   HCT 38.9 01/02/2022   PLT 176 04/27/2022   PLT 156 01/02/2022   LYMPHOPCT 15 04/20/2022   MONOPCT 9 04/20/2022   EOSPCT 0 04/20/2022   BASOPCT 0 04/20/2022   CMP:  Lab Results  Component Value Date   NA 135 04/27/2022   NA 135 03/30/2022   K 3.5 04/27/2022   CL 108 04/27/2022   CO2 25 04/27/2022   BUN 22 04/27/2022   BUN 6 (L) 03/30/2022   CREATININE 0.64 04/27/2022   CREATININE 0.59 07/08/2020   PROT 8.5 (H) 04/27/2022   PROT 8.6 (H) 03/30/2022   ALBUMIN 2.2 (L) 04/27/2022   ALBUMIN 1.4 (LL) 03/30/2022   BILITOT 1.4 (H) 04/27/2022   BILITOT 0.9 03/30/2022   ALKPHOS 57 04/27/2022   AST 28 04/27/2022   ALT 16 04/27/2022   Total Discharge time is about 33 minutes  Roxan Hockey M.D on 04/27/2022 at 4:47 PM  Go to [email or web address] -  for contact  info  [facility] - Office  [phone]

## 2022-04-28 ENCOUNTER — Inpatient Hospital Stay (HOSPITAL_COMMUNITY): Payer: Medicare Other

## 2022-04-28 ENCOUNTER — Emergency Department (HOSPITAL_COMMUNITY): Payer: Medicare Other

## 2022-04-28 ENCOUNTER — Inpatient Hospital Stay (HOSPITAL_COMMUNITY)
Admission: EM | Admit: 2022-04-28 | Discharge: 2022-05-19 | DRG: 291 | Disposition: A | Discharge status: Skilled Nursing Facility | Payer: Medicare Other | Attending: Internal Medicine | Admitting: Internal Medicine

## 2022-04-28 DIAGNOSIS — I509 Heart failure, unspecified: Secondary | ICD-10-CM | POA: Insufficient documentation

## 2022-04-28 DIAGNOSIS — Z66 Do not resuscitate: Secondary | ICD-10-CM | POA: Diagnosis not present

## 2022-04-28 DIAGNOSIS — I483 Typical atrial flutter: Secondary | ICD-10-CM | POA: Diagnosis present

## 2022-04-28 DIAGNOSIS — E039 Hypothyroidism, unspecified: Secondary | ICD-10-CM | POA: Diagnosis present

## 2022-04-28 DIAGNOSIS — I10 Essential (primary) hypertension: Secondary | ICD-10-CM | POA: Diagnosis not present

## 2022-04-28 DIAGNOSIS — J9622 Acute and chronic respiratory failure with hypercapnia: Secondary | ICD-10-CM | POA: Diagnosis present

## 2022-04-28 DIAGNOSIS — Z809 Family history of malignant neoplasm, unspecified: Secondary | ICD-10-CM

## 2022-04-28 DIAGNOSIS — I3139 Other pericardial effusion (noninflammatory): Secondary | ICD-10-CM | POA: Diagnosis present

## 2022-04-28 DIAGNOSIS — I482 Chronic atrial fibrillation, unspecified: Secondary | ICD-10-CM | POA: Diagnosis present

## 2022-04-28 DIAGNOSIS — G9341 Metabolic encephalopathy: Secondary | ICD-10-CM | POA: Diagnosis present

## 2022-04-28 DIAGNOSIS — E0590 Thyrotoxicosis, unspecified without thyrotoxic crisis or storm: Secondary | ICD-10-CM | POA: Diagnosis present

## 2022-04-28 DIAGNOSIS — I480 Paroxysmal atrial fibrillation: Secondary | ICD-10-CM | POA: Diagnosis not present

## 2022-04-28 DIAGNOSIS — G4733 Obstructive sleep apnea (adult) (pediatric): Secondary | ICD-10-CM | POA: Diagnosis present

## 2022-04-28 DIAGNOSIS — M069 Rheumatoid arthritis, unspecified: Secondary | ICD-10-CM | POA: Diagnosis present

## 2022-04-28 DIAGNOSIS — E870 Hyperosmolality and hypernatremia: Secondary | ICD-10-CM | POA: Diagnosis present

## 2022-04-28 DIAGNOSIS — E785 Hyperlipidemia, unspecified: Secondary | ICD-10-CM | POA: Diagnosis present

## 2022-04-28 DIAGNOSIS — R0600 Dyspnea, unspecified: Principal | ICD-10-CM

## 2022-04-28 DIAGNOSIS — Z79899 Other long term (current) drug therapy: Secondary | ICD-10-CM

## 2022-04-28 DIAGNOSIS — J441 Chronic obstructive pulmonary disease with (acute) exacerbation: Secondary | ICD-10-CM | POA: Diagnosis present

## 2022-04-28 DIAGNOSIS — J810 Acute pulmonary edema: Secondary | ICD-10-CM | POA: Diagnosis not present

## 2022-04-28 DIAGNOSIS — N179 Acute kidney failure, unspecified: Secondary | ICD-10-CM | POA: Diagnosis not present

## 2022-04-28 DIAGNOSIS — Z515 Encounter for palliative care: Secondary | ICD-10-CM | POA: Diagnosis not present

## 2022-04-28 DIAGNOSIS — E871 Hypo-osmolality and hyponatremia: Secondary | ICD-10-CM | POA: Diagnosis present

## 2022-04-28 DIAGNOSIS — I5033 Acute on chronic diastolic (congestive) heart failure: Secondary | ICD-10-CM | POA: Diagnosis not present

## 2022-04-28 DIAGNOSIS — Z20822 Contact with and (suspected) exposure to covid-19: Secondary | ICD-10-CM | POA: Diagnosis present

## 2022-04-28 DIAGNOSIS — Z7984 Long term (current) use of oral hypoglycemic drugs: Secondary | ICD-10-CM

## 2022-04-28 DIAGNOSIS — Z86711 Personal history of pulmonary embolism: Secondary | ICD-10-CM

## 2022-04-28 DIAGNOSIS — Z79890 Hormone replacement therapy (postmenopausal): Secondary | ICD-10-CM

## 2022-04-28 DIAGNOSIS — R0602 Shortness of breath: Secondary | ICD-10-CM | POA: Diagnosis present

## 2022-04-28 DIAGNOSIS — L4050 Arthropathic psoriasis, unspecified: Secondary | ICD-10-CM | POA: Diagnosis present

## 2022-04-28 DIAGNOSIS — I110 Hypertensive heart disease with heart failure: Secondary | ICD-10-CM | POA: Diagnosis present

## 2022-04-28 DIAGNOSIS — M797 Fibromyalgia: Secondary | ICD-10-CM | POA: Diagnosis present

## 2022-04-28 DIAGNOSIS — K7682 Hepatic encephalopathy: Secondary | ICD-10-CM | POA: Diagnosis present

## 2022-04-28 DIAGNOSIS — Z6841 Body Mass Index (BMI) 40.0 and over, adult: Secondary | ICD-10-CM

## 2022-04-28 DIAGNOSIS — J9601 Acute respiratory failure with hypoxia: Secondary | ICD-10-CM

## 2022-04-28 DIAGNOSIS — J9621 Acute and chronic respiratory failure with hypoxia: Secondary | ICD-10-CM | POA: Diagnosis present

## 2022-04-28 DIAGNOSIS — J690 Pneumonitis due to inhalation of food and vomit: Secondary | ICD-10-CM | POA: Diagnosis present

## 2022-04-28 DIAGNOSIS — Z823 Family history of stroke: Secondary | ICD-10-CM

## 2022-04-28 DIAGNOSIS — Z7982 Long term (current) use of aspirin: Secondary | ICD-10-CM

## 2022-04-28 DIAGNOSIS — K219 Gastro-esophageal reflux disease without esophagitis: Secondary | ICD-10-CM | POA: Diagnosis not present

## 2022-04-28 DIAGNOSIS — E559 Vitamin D deficiency, unspecified: Secondary | ICD-10-CM | POA: Diagnosis present

## 2022-04-28 DIAGNOSIS — J811 Chronic pulmonary edema: Secondary | ICD-10-CM

## 2022-04-28 DIAGNOSIS — E11649 Type 2 diabetes mellitus with hypoglycemia without coma: Secondary | ICD-10-CM | POA: Diagnosis not present

## 2022-04-28 DIAGNOSIS — E1165 Type 2 diabetes mellitus with hyperglycemia: Secondary | ICD-10-CM | POA: Diagnosis present

## 2022-04-28 DIAGNOSIS — K7581 Nonalcoholic steatohepatitis (NASH): Secondary | ICD-10-CM | POA: Diagnosis present

## 2022-04-28 DIAGNOSIS — E440 Moderate protein-calorie malnutrition: Secondary | ICD-10-CM | POA: Diagnosis present

## 2022-04-28 DIAGNOSIS — Z818 Family history of other mental and behavioral disorders: Secondary | ICD-10-CM

## 2022-04-28 DIAGNOSIS — F419 Anxiety disorder, unspecified: Secondary | ICD-10-CM | POA: Diagnosis present

## 2022-04-28 DIAGNOSIS — Z87891 Personal history of nicotine dependence: Secondary | ICD-10-CM

## 2022-04-28 DIAGNOSIS — E1169 Type 2 diabetes mellitus with other specified complication: Secondary | ICD-10-CM | POA: Diagnosis not present

## 2022-04-28 DIAGNOSIS — Z8249 Family history of ischemic heart disease and other diseases of the circulatory system: Secondary | ICD-10-CM

## 2022-04-28 DIAGNOSIS — R1310 Dysphagia, unspecified: Secondary | ICD-10-CM | POA: Diagnosis present

## 2022-04-28 DIAGNOSIS — K7460 Unspecified cirrhosis of liver: Secondary | ICD-10-CM | POA: Diagnosis not present

## 2022-04-28 DIAGNOSIS — Z7189 Other specified counseling: Secondary | ICD-10-CM | POA: Diagnosis not present

## 2022-04-28 DIAGNOSIS — E861 Hypovolemia: Secondary | ICD-10-CM | POA: Diagnosis not present

## 2022-04-28 DIAGNOSIS — Z7951 Long term (current) use of inhaled steroids: Secondary | ICD-10-CM

## 2022-04-28 DIAGNOSIS — F32A Depression, unspecified: Secondary | ICD-10-CM | POA: Diagnosis present

## 2022-04-28 DIAGNOSIS — Z781 Physical restraint status: Secondary | ICD-10-CM

## 2022-04-28 DIAGNOSIS — E8809 Other disorders of plasma-protein metabolism, not elsewhere classified: Secondary | ICD-10-CM | POA: Diagnosis present

## 2022-04-28 DIAGNOSIS — E875 Hyperkalemia: Secondary | ICD-10-CM | POA: Diagnosis not present

## 2022-04-28 DIAGNOSIS — Z833 Family history of diabetes mellitus: Secondary | ICD-10-CM

## 2022-04-28 DIAGNOSIS — E876 Hypokalemia: Secondary | ICD-10-CM | POA: Diagnosis present

## 2022-04-28 LAB — COMPREHENSIVE METABOLIC PANEL
ALT: 18 U/L (ref 0–44)
AST: 42 U/L — ABNORMAL HIGH (ref 15–41)
Albumin: 2.3 g/dL — ABNORMAL LOW (ref 3.5–5.0)
Alkaline Phosphatase: 61 U/L (ref 38–126)
Anion gap: 6 (ref 5–15)
BUN: 16 mg/dL (ref 8–23)
CO2: 22 mmol/L (ref 22–32)
Calcium: 9 mg/dL (ref 8.9–10.3)
Chloride: 104 mmol/L (ref 98–111)
Creatinine, Ser: 0.72 mg/dL (ref 0.44–1.00)
GFR, Estimated: >60 mL/min (ref >60)
Glucose, Bld: 98 mg/dL (ref 70–99)
Potassium: 4 mmol/L (ref 3.5–5.1)
Sodium: 132 mmol/L — ABNORMAL LOW (ref 135–145)
Total Bilirubin: 1.4 mg/dL — ABNORMAL HIGH (ref 0.3–1.2)
Total Protein: 9.2 g/dL — ABNORMAL HIGH (ref 6.5–8.1)

## 2022-04-28 LAB — CBC WITH DIFFERENTIAL/PLATELET
Abs Immature Granulocytes: 0.14 10*3/uL — ABNORMAL HIGH (ref 0.00–0.07)
Basophils Absolute: 0 10*3/uL (ref 0.0–0.1)
Basophils Relative: 0 %
Eosinophils Absolute: 0.2 10*3/uL (ref 0.0–0.5)
Eosinophils Relative: 1 %
HCT: 30.4 % — ABNORMAL LOW (ref 36.0–46.0)
Hemoglobin: 9.8 g/dL — ABNORMAL LOW (ref 12.0–15.0)
Immature Granulocytes: 1 %
Lymphocytes Relative: 13 %
Lymphs Abs: 1.5 10*3/uL (ref 0.7–4.0)
MCH: 32.6 pg (ref 26.0–34.0)
MCHC: 32.2 g/dL (ref 30.0–36.0)
MCV: 101 fL — ABNORMAL HIGH (ref 80.0–100.0)
Monocytes Absolute: 1.3 10*3/uL — ABNORMAL HIGH (ref 0.1–1.0)
Monocytes Relative: 11 %
Neutro Abs: 8.7 10*3/uL — ABNORMAL HIGH (ref 1.7–7.7)
Neutrophils Relative %: 74 %
Platelets: 222 10*3/uL (ref 150–400)
RBC: 3.01 MIL/uL — ABNORMAL LOW (ref 3.87–5.11)
RDW: 16.1 % — ABNORMAL HIGH (ref 11.5–15.5)
WBC: 11.8 10*3/uL — ABNORMAL HIGH (ref 4.0–10.5)
nRBC: 0.3 % — ABNORMAL HIGH (ref 0.0–0.2)

## 2022-04-28 LAB — CBG MONITORING, ED
Glucose-Capillary: 86 mg/dL (ref 70–99)
Glucose-Capillary: 98 mg/dL (ref 70–99)

## 2022-04-28 LAB — AMMONIA: Ammonia: 25 umol/L (ref 9–35)

## 2022-04-28 LAB — I-STAT ARTERIAL BLOOD GAS, ED
Acid-base deficit: 2 mmol/L (ref 0.0–2.0)
Bicarbonate: 23.1 mmol/L (ref 20.0–28.0)
Calcium, Ion: 1.38 mmol/L (ref 1.15–1.40)
HCT: 29 % — ABNORMAL LOW (ref 36.0–46.0)
Hemoglobin: 9.9 g/dL — ABNORMAL LOW (ref 12.0–15.0)
O2 Saturation: 93 %
Patient temperature: 99.2
Potassium: 4.1 mmol/L (ref 3.5–5.1)
Sodium: 139 mmol/L (ref 135–145)
TCO2: 24 mmol/L (ref 22–32)
pCO2 arterial: 42.1 mmHg (ref 32–48)
pH, Arterial: 7.349 — ABNORMAL LOW (ref 7.35–7.45)
pO2, Arterial: 73 mmHg — ABNORMAL LOW (ref 83–108)

## 2022-04-28 LAB — BRAIN NATRIURETIC PEPTIDE: B Natriuretic Peptide: 103.7 pg/mL — ABNORMAL HIGH (ref 0.0–100.0)

## 2022-04-28 LAB — LACTIC ACID, PLASMA
Lactic Acid, Venous: 1.8 mmol/L (ref 0.5–1.9)
Lactic Acid, Venous: 1.5 mmol/L (ref 0.5–1.9)

## 2022-04-28 LAB — PROTIME-INR
INR: 1.7 — ABNORMAL HIGH (ref 0.8–1.2)
Prothrombin Time: 19.9 seconds — ABNORMAL HIGH (ref 11.4–15.2)

## 2022-04-28 MED ORDER — ACETAMINOPHEN 325 MG PO TABS: 650.0000 mg | ORAL_TABLET | ORAL | Status: DC | PRN

## 2022-04-28 MED ORDER — LEVOTHYROXINE SODIUM 75 MCG PO TABS
75.0000 ug | ORAL_TABLET | Freq: Every day | ORAL | Status: DC
  Administered 2022-04-30: 75 ug via ORAL
  Filled 2022-04-28: qty 1

## 2022-04-28 MED ORDER — ALBUTEROL SULFATE (2.5 MG/3ML) 0.083% IN NEBU: 2.5000 mg | INHALATION_SOLUTION | Freq: Four times a day (QID) | RESPIRATORY_TRACT | Status: DC | PRN

## 2022-04-28 MED ORDER — ONDANSETRON HCL 4 MG/2ML IJ SOLN
4.0000 mg | Freq: Four times a day (QID) | INTRAMUSCULAR | Status: DC | PRN
  Administered 2022-05-04 – 2022-05-14 (×14): 4 mg via INTRAVENOUS
  Filled 2022-04-28 (×14): qty 2

## 2022-04-28 MED ORDER — DULOXETINE HCL 60 MG PO CPEP
60.0000 mg | ORAL_CAPSULE | Freq: Every day | ORAL | Status: DC
  Administered 2022-04-29 – 2022-05-01 (×3): 60 mg via ORAL
  Filled 2022-04-28 (×4): qty 1

## 2022-04-28 MED ORDER — FUROSEMIDE 10 MG/ML IJ SOLN
80.0000 mg | Freq: Two times a day (BID) | INTRAMUSCULAR | Status: DC
  Administered 2022-04-28 – 2022-04-30 (×4): 80 mg via INTRAVENOUS
  Filled 2022-04-28 (×4): qty 8

## 2022-04-28 MED ORDER — OLANZAPINE 10 MG IM SOLR: 5.0000 mg | Freq: Four times a day (QID) | INTRAMUSCULAR | Status: DC | PRN

## 2022-04-28 MED ORDER — GUAIFENESIN ER 600 MG PO TB12
1200.0000 mg | ORAL_TABLET | Freq: Two times a day (BID) | ORAL | Status: DC
  Administered 2022-04-29 – 2022-04-30 (×3): 1200 mg via ORAL
  Filled 2022-04-28 (×3): qty 2

## 2022-04-28 MED ORDER — ALBUMIN HUMAN 25 % IV SOLN
25.0000 g | Freq: Four times a day (QID) | INTRAVENOUS | Status: AC
  Administered 2022-04-28 – 2022-04-29 (×4): 25 g via INTRAVENOUS
  Filled 2022-04-28 (×4): qty 100

## 2022-04-28 MED ORDER — SODIUM CHLORIDE 0.9 % IV SOLN: INTRAVENOUS | Status: DC

## 2022-04-28 MED ORDER — SODIUM CHLORIDE 0.9% FLUSH
3.0000 mL | Freq: Two times a day (BID) | INTRAVENOUS | Status: DC
  Administered 2022-04-29 – 2022-05-03 (×10): 3 mL via INTRAVENOUS

## 2022-04-28 MED ORDER — METOPROLOL TARTRATE 25 MG PO TABS
37.5000 mg | ORAL_TABLET | Freq: Two times a day (BID) | ORAL | Status: DC
  Administered 2022-04-29 – 2022-04-30 (×3): 37.5 mg via ORAL
  Filled 2022-04-28: qty 1
  Filled 2022-04-28: qty 2
  Filled 2022-04-28 (×3): qty 1

## 2022-04-28 MED ORDER — MECLIZINE HCL 25 MG PO TABS: 25.0000 mg | ORAL_TABLET | Freq: Three times a day (TID) | ORAL | Status: DC | PRN

## 2022-04-28 MED ORDER — METFORMIN HCL ER 500 MG PO TB24
500.0000 mg | ORAL_TABLET | Freq: Two times a day (BID) | ORAL | Status: DC
  Administered 2022-04-29 – 2022-04-30 (×3): 500 mg via ORAL
  Filled 2022-04-28 (×5): qty 1

## 2022-04-28 MED ORDER — SODIUM CHLORIDE 0.9% FLUSH: 3.0000 mL | INTRAVENOUS | Status: DC | PRN

## 2022-04-28 MED ORDER — PILOCARPINE HCL 5 MG PO TABS
5.0000 mg | ORAL_TABLET | Freq: Two times a day (BID) | ORAL | Status: DC
  Administered 2022-04-29 – 2022-04-30 (×3): 5 mg via ORAL
  Filled 2022-04-28 (×6): qty 1

## 2022-04-28 MED ORDER — METHYLPREDNISOLONE SODIUM SUCC 125 MG IJ SOLR
80.0000 mg | Freq: Every day | INTRAMUSCULAR | Status: DC
  Administered 2022-04-28 – 2022-04-29 (×2): 80 mg via INTRAVENOUS
  Filled 2022-04-28 (×2): qty 2

## 2022-04-28 MED ORDER — PANTOPRAZOLE SODIUM 40 MG PO TBEC
40.0000 mg | DELAYED_RELEASE_TABLET | Freq: Two times a day (BID) | ORAL | Status: DC
  Administered 2022-04-29 – 2022-04-30 (×3): 40 mg via ORAL
  Filled 2022-04-28 (×4): qty 1

## 2022-04-28 MED ORDER — SODIUM CHLORIDE 0.9 % IV SOLN
2.0000 g | Freq: Three times a day (TID) | INTRAVENOUS | Status: DC
  Administered 2022-04-28 – 2022-04-29 (×3): 2 g via INTRAVENOUS
  Filled 2022-04-28 (×3): qty 12.5

## 2022-04-28 MED ORDER — MELATONIN 5 MG PO TABS: 10.0000 mg | ORAL_TABLET | Freq: Every evening | ORAL | Status: DC | PRN

## 2022-04-28 MED ORDER — RIFAXIMIN 550 MG PO TABS
550.0000 mg | ORAL_TABLET | Freq: Two times a day (BID) | ORAL | Status: DC
  Administered 2022-04-29 – 2022-04-30 (×3): 550 mg via ORAL
  Filled 2022-04-28 (×6): qty 1

## 2022-04-28 MED ORDER — HEPARIN SODIUM (PORCINE) 5000 UNIT/ML IJ SOLN: 5000.0000 [IU] | Freq: Three times a day (TID) | INTRAMUSCULAR | Status: DC

## 2022-04-28 MED ORDER — IPRATROPIUM-ALBUTEROL 0.5-2.5 (3) MG/3ML IN SOLN
3.0000 mL | Freq: Four times a day (QID) | RESPIRATORY_TRACT | Status: DC
  Administered 2022-04-28 – 2022-05-05 (×27): 3 mL via RESPIRATORY_TRACT
  Filled 2022-04-28 (×28): qty 3

## 2022-04-28 MED ORDER — PHYTONADIONE 5 MG PO TABS
5.0000 mg | ORAL_TABLET | Freq: Once | ORAL | Status: DC
  Filled 2022-04-28: qty 1

## 2022-04-28 MED ORDER — SODIUM CHLORIDE 0.9 % IV SOLN
100.0000 mg | Freq: Once | INTRAVENOUS | Status: AC
  Administered 2022-04-28: 100 mg via INTRAVENOUS
  Filled 2022-04-28: qty 100

## 2022-04-28 MED ORDER — MIDODRINE HCL 5 MG PO TABS
5.0000 mg | ORAL_TABLET | Freq: Two times a day (BID) | ORAL | Status: DC
  Administered 2022-04-29 – 2022-04-30 (×3): 5 mg via ORAL
  Filled 2022-04-28 (×3): qty 1

## 2022-04-28 MED ORDER — SODIUM CHLORIDE 0.9 % IV SOLN: 250.0000 mL | INTRAVENOUS | Status: DC | PRN

## 2022-04-28 MED ORDER — CLOTRIMAZOLE 10 MG MT TROC
10.0000 mg | Freq: Every day | OROMUCOSAL | Status: DC
  Administered 2022-04-29 – 2022-04-30 (×6): 10 mg via ORAL
  Filled 2022-04-28 (×12): qty 1

## 2022-04-28 MED ORDER — ACETAMINOPHEN 325 MG PO TABS: 650.0000 mg | ORAL_TABLET | Freq: Four times a day (QID) | ORAL | Status: DC | PRN

## 2022-04-28 MED ORDER — LACTULOSE 10 GM/15ML PO SOLN
20.0000 g | Freq: Two times a day (BID) | ORAL | Status: DC
  Administered 2022-04-29 – 2022-04-30 (×3): 20 g via ORAL
  Filled 2022-04-28 (×4): qty 30

## 2022-04-28 MED ORDER — SPIRONOLACTONE 25 MG PO TABS
50.0000 mg | ORAL_TABLET | Freq: Every day | ORAL | Status: DC
  Administered 2022-04-29: 50 mg via ORAL
  Filled 2022-04-28: qty 2

## 2022-04-28 MED ORDER — SUCRALFATE 1 G PO TABS
1.0000 g | ORAL_TABLET | Freq: Four times a day (QID) | ORAL | Status: DC
  Administered 2022-04-29 – 2022-04-30 (×5): 1 g via ORAL
  Filled 2022-04-28 (×10): qty 1

## 2022-04-28 MED ORDER — MOMETASONE FURO-FORMOTEROL FUM 100-5 MCG/ACT IN AERO
2.0000 | INHALATION_SPRAY | Freq: Two times a day (BID) | RESPIRATORY_TRACT | Status: DC
  Administered 2022-04-29: 2 via RESPIRATORY_TRACT
  Filled 2022-04-28: qty 8.8

## 2022-04-28 MED ORDER — ROSUVASTATIN CALCIUM 5 MG PO TABS
10.0000 mg | ORAL_TABLET | Freq: Every day | ORAL | Status: DC
  Administered 2022-04-29 – 2022-04-30 (×2): 10 mg via ORAL
  Filled 2022-04-28 (×2): qty 2

## 2022-04-28 MED ORDER — ALBUTEROL SULFATE HFA 108 (90 BASE) MCG/ACT IN AERS: 1.0000 | INHALATION_SPRAY | Freq: Four times a day (QID) | RESPIRATORY_TRACT | Status: DC | PRN

## 2022-04-28 MED ORDER — INSULIN ASPART 100 UNIT/ML IJ SOLN
0.0000 [IU] | Freq: Three times a day (TID) | INTRAMUSCULAR | Status: DC
  Administered 2022-04-29 – 2022-05-01 (×8): 2 [IU] via SUBCUTANEOUS
  Administered 2022-05-02: 3 [IU] via SUBCUTANEOUS
  Administered 2022-05-02: 2 [IU] via SUBCUTANEOUS

## 2022-04-28 MED ORDER — ASPIRIN 81 MG PO TBEC
81.0000 mg | DELAYED_RELEASE_TABLET | Freq: Every day | ORAL | Status: DC
  Administered 2022-04-29 – 2022-04-30 (×2): 81 mg via ORAL
  Filled 2022-04-28 (×2): qty 1

## 2022-04-28 NOTE — ED Provider Notes (Signed)
[facility] EMERGENCY DEPARTMENT Provider Note   CSN: [id number] Arrival date & time: 04/28/22  1412     History  No chief complaint on file.   Yolanda [name] is a 66 y.o. female.  66 year old female who presents with increasing dyspnea over 24 hours.  Patient discharged in the hospital yesterday after an 8-day stay for hepatic encephalopathy with multifocal pneumonia.  States that she is chronically on 3 L of oxygen and left the hospital short of breath.  States that it has become worse and not associate with chest pain or chest pressure.  No fevers.  No emesis noted.  No new leg pain or swelling.  Does endorse orthopnea as well as dyspnea on exertion.  Called EMS and was found to be observed for distress and placed on CPAP and transported here       Home Medications Prior to Admission medications   Medication Sig Start Date End Date Taking? Authorizing Provider  acetaminophen (TYLENOL) 325 MG tablet Take 2 tablets (650 mg total) by mouth every 6 (six) hours as needed for mild pain, headache or fever (or Fever >/= 101). 04/27/22   Roxan Hockey, MD  albuterol (VENTOLIN HFA) 108 (90 Base) MCG/ACT inhaler Inhale 1-2 puffs into the lungs every 6 (six) hours as needed for wheezing or shortness of breath. 11/08/19   Long, Wonda Olds, MD  amoxicillin-clavulanate (AUGMENTIN) 875-125 MG tablet Take 1 tablet by mouth 2 (two) times daily for 5 days. 04/27/22 05/02/22  Roxan Hockey, MD  aspirin EC 81 MG tablet Take 81 mg by mouth daily. Swallow whole.    [provider]  blood glucose meter kit and supplies Dispense based on patient and insurance preference. Use up to four times daily as directed. (FOR ICD-10 E10.9, E11.9). 09/22/21   Johnson, Clanford L, MD  budesonide-formoterol (SYMBICORT) 80-4.5 MCG/ACT inhaler Take 2 puffs first thing in am and then another 2 puffs about 12 hours later. 11/25/21   Tanda Rockers, MD  Calcium Carb-Cholecalciferol (CALCIUM 600  + D PO) Take by mouth.    [provider]  Cholecalciferol (VITAMIN D3) 2000 units TABS Take 2,000 Units by mouth daily.     [provider]  clotrimazole (MYCELEX) 10 MG troche Take 10 mg by mouth 5 (five) times daily.    [provider]  diltiazem (CARDIZEM CD) 120 MG 24 hr capsule Take 1 capsule (120 mg total) by mouth daily. 04/27/22 04/27/23  Roxan Hockey, MD  doxycycline (VIBRA-TABS) 100 MG tablet Take 1 tablet (100 mg total) by mouth 2 (two) times daily for 5 days. 04/27/22 05/02/22  Roxan Hockey, MD  DULoxetine (CYMBALTA) 60 MG capsule Take 60 mg by mouth daily. 07/15/21   [provider]  furosemide (LASIX) 40 MG tablet Take 1 tablet (40 mg total) by mouth daily. 04/27/22 10/24/22  Roxan Hockey, MD  inFLIXimab (REMICADE IV) Inject into the vein. Q three months per patient.    [provider]  lactulose, encephalopathy, (CHRONULAC) 10 GM/15ML SOLN Take 30 mLs (20 g total) by mouth 2 (two) times daily. Goal is 2 to 3 soft/mushy BMs, Ok to take additional dose if No BM in 24 hrs 04/27/22   Roxan Hockey, MD  levothyroxine (SYNTHROID) 75 MCG tablet Take 1 tablet by mouth daily. 12/26/21   [provider]  meclizine (ANTIVERT) 25 MG tablet Take 25 mg by mouth 3 (three) times daily as needed for dizziness.    [provider]  Melatonin 10  MG CAPS Take 10 mg by mouth at bedtime as needed (sleep).    [provider]  metFORMIN (GLUCOPHAGE-XR) 500 MG 24 hr tablet Take 1 tablet (500 mg total) by mouth 2 (two) times daily with a meal. 04/27/22   Emokpae, Courage, MD  metoprolol tartrate (LOPRESSOR) 25 MG tablet Take 1 tablet (25 mg total) by mouth 2 (two) times daily. 04/27/22   Roxan Hockey, MD  midodrine (PROAMATINE) 5 MG tablet Take 1 tablet (5 mg total) by mouth 2 (two) times daily with a meal. 04/27/22   Emokpae, Courage, MD  pantoprazole (PROTONIX) 40 MG tablet Take 40 mg by mouth 2 (two) times daily.     [provider]  pilocarpine (SALAGEN) 5 MG tablet Take 5 mg by mouth 2 (two) times daily.    [provider]  potassium chloride (KLOR-CON) 10 MEQ tablet Take 1 tablet (10 mEq total) by mouth daily. Take While taking Lasix/furosemide 04/27/22   Roxan Hockey, MD  rifaximin (XIFAXAN) 550 MG TABS tablet Take 1 tablet (550 mg total) by mouth 2 (two) times daily. 04/27/22   Roxan Hockey, MD  rosuvastatin (CRESTOR) 10 MG tablet Take 10 mg by mouth daily.    [provider]  spironolactone (ALDACTONE) 50 MG tablet Take 1 tablet (50 mg total) by mouth daily. 04/27/22 04/27/23  Roxan Hockey, MD  sucralfate (CARAFATE) 1 g tablet Take 1 g by mouth 4 (four) times daily. 01/25/22   [provider]      Allergies    Azithromycin, Lisinopril, and Morphine    Review of Systems   Review of Systems  All other systems reviewed and are negative.   Physical Exam Updated Vital Signs LMP 07/11/2007 (Approximate)  Physical Exam Vitals and nursing note reviewed.  Constitutional:      General: She is not in acute distress.    Appearance: Normal appearance. She is well-developed. She is not toxic-appearing.  HENT:     Head: Normocephalic and atraumatic.  Eyes:     General: Lids are normal.     Conjunctiva/sclera: Conjunctivae normal.     Pupils: Pupils are equal, round, and reactive to light.  Neck:     Thyroid: No thyroid mass.     Trachea: No tracheal deviation.  Cardiovascular:     Rate and Rhythm: Normal rate and regular rhythm.     Heart sounds: Normal heart sounds. No murmur heard.    No gallop.  Pulmonary:     Effort: Tachypnea and respiratory distress present.     Breath sounds: Decreased air movement present. Examination of the right-lower field reveals decreased breath sounds and rhonchi. Examination of the left-lower field reveals decreased breath sounds and rhonchi. Decreased breath sounds and rhonchi present. No wheezing or rales.  Abdominal:      General: There is no distension.     Palpations: Abdomen is soft.     Tenderness: There is no abdominal tenderness. There is no rebound.  Musculoskeletal:        General: No tenderness. Normal range of motion.     Cervical back: Normal range of motion and neck supple.  Skin:    General: Skin is warm and dry.     Findings: No abrasion or rash.  Neurological:     Mental Status: She is alert and oriented to person, place, and time. Mental status is at baseline.     GCS: GCS eye subscore is 4. GCS verbal subscore is 5. GCS motor subscore is 6.  Cranial Nerves: No cranial nerve deficit.     Sensory: No sensory deficit.     Motor: Motor function is intact.  Psychiatric:        Attention and Perception: Attention normal.        Speech: Speech normal.        Behavior: Behavior normal.     ED Results / Procedures / Treatments   Labs (all labs ordered are listed, but only abnormal results are displayed) Labs Reviewed  BRAIN NATRIURETIC PEPTIDE  COMPREHENSIVE METABOLIC PANEL  CBC WITH DIFFERENTIAL/PLATELET  AMMONIA  LACTIC ACID, PLASMA  LACTIC ACID, PLASMA    EKG EKG Interpretation  Date/Time:  Friday April 28 2022 14:25:12 EDT Ventricular Rate:  86 PR Interval:  53 QRS Duration: 79 QT Interval:  326 QTC Calculation: 390 R Axis:   31 Text Interpretation: Sinus rhythm Short PR interval Probable left atrial enlargement Anteroseptal infarct, age indeterminate ST elevation, consider inferior injury Lateral leads are also involved Confirmed by Lacretia Leigh ([id number]) on 04/28/2022 2:26:48 PM  Radiology No results found.  Procedures Procedures    Medications Ordered in ED Medications  0.9 %  sodium chloride infusion (has no administration in time range)    ED Course/ Medical Decision Making/ A&P                           Medical Decision Making Amount and/or Complexity of Data Reviewed Labs: ordered. Radiology: ordered. ECG/medicine tests:  ordered.  Risk Prescription drug management.   Patient presented with shortness of breath.  Chest x-ray my interpretation shows pna.  EKG per my interpretation shows no signs of acute coronary syndrome.  Patient initially placed back on nasal cannula oxygen at 3 L but she became more tachypneic.  Patient placed on BiPAP and respiratory status improved.  Patient restarted on antibiotics for likely multifocal pneumonia.  Patient may have some underlying CHF.  Reevaluated multiple times and is stable at this time.  Discussed with [facility] and patient will be admitted   [place] Performed by: Leota Jacobsen Total critical care time: 50 minutes Critical care time was exclusive of separately billable procedures and treating other patients. Critical care was necessary to treat or prevent imminent or life-threatening deterioration. Critical care was time spent personally by me on the following activities: development of treatment plan with patient and/or surrogate as well as nursing, discussions with consultants, evaluation of patient's response to treatment, examination of patient, obtaining history from patient or surrogate, ordering and performing treatments and interventions, ordering and review of laboratory studies, ordering and review of radiographic studies, pulse oximetry and re-evaluation of patient's condition.         Final Clinical Impression(s) / ED Diagnoses Final diagnoses:  None    Rx / DC Orders ED Discharge Orders     None         Lacretia Leigh, MD 04/28/22 1558

## 2022-04-28 NOTE — Progress Notes (Signed)
RT NOTE: RT placed patient on bipap per MD order due to patient increased WOB @1445 . Patient tolerating well at this time. RT will continue to monitor.

## 2022-04-28 NOTE — ED Triage Notes (Signed)
Pt  here from home with c/o sob arrived on cpap , was just discharged from [facility] , sats 88 %  on room air with ems

## 2022-04-28 NOTE — Progress Notes (Signed)
D/W ICU attending, went through ABG result. ICU team will see the patient emergently.

## 2022-04-28 NOTE — H&P (Signed)
History and Physical    FLORNCE RECORD LHT:[id number] DOB: [birth year] DOA: 04/28/2022  PCP: Celene Squibb, MD (Confirm with patient/family/NH records and if not entered, this has to be entered at [facility] point of entry) Patient coming from: Home  I have personally briefly reviewed patient's old medical records in [place]  Chief Complaint: SOB, feeling tired  HPI: Yolanda [name] is a 66 y.o. female with medical history significant of cirrhosis secondary to NASH with recent hepatic encephalopathy, PAF not on anticoagulation, chronic HFpEF, recent multifocal pneumonia, hypothyroidism, GERD, morbid obesity, COPD Gold stage II, sent by family member for evaluation of worsening hypoxia.  Unable to obtain any history from patient as she is having significant respiratory distress this morning, will history provided by patient daughter is at bedside.  Patient was released from hospital yesterday after treatment of complex problem including new onset of A-fib with RVR, acute diastolic CHF decompensation, multifocal pneumonia, and Enterobacter UTI.  Patient was discharged home on p.o. Lasix 40 mg daily and p.o. antibiotics including Augmentin and doxycycline.  Daughter reported that the patient has had significant hypoxia since yesterday evening.  Family had patient wear a continuous portable pulse ox, but showed significant hypoxia when patient stands up walking O2 saturation dropped to low 80s compared to lower 90s when lying down.  This morning, patient pulse ox dropped to 60s at rest, even though improved home oxygen 3 L continuously, pulse ox improving.  And patient significantly sized increasing breathing rate.  ED Course: Patient was found to be in significant respiratory distress tachypnea, profound hypoxia and placed on BiPAP 40% saturation 96%.  Chest x-ray again shows bilateral multifocal infiltrates suspicious for pneumonia.  WBC 11.8, hemoglobin 9.8, creatinine 0.7 K4.0 albumin  2.2.  Patient was started on cefepime and IV doxycycline.  Review of Systems: Unable to perform, patient has significant respiratory distress.  Past Medical History:  Diagnosis Date   Abnormal uterine bleeding    Anxiety    Arthritis    Chronic abdominal pain    Chronic pain in left foot    Depression    Elevated liver function tests 2018   Endometriosis    Fibromyalgia    GERD 12/21/2009   Qualifier: Diagnosis of  By: Craige Cotta     Hypertension    stopped meds in Aug 2015   Hypothyroidism    IBS (irritable bowel syndrome)    Internal hemorrhoids    Kidney stone    Liver cirrhosis secondary to NASH (HCC)    Obesity, morbid (HCC)    PONV (postoperative nausea and vomiting)    Psoriatic arthritis ([place])    Pulmonary embolism ([place]) 12/2005   Qualifier: Diagnosis of  By: Kellie Simmering LPN, Almyra Free     Rheumatoid arthritis [facility])    Sleep apnea    Vitamin D deficiency     Past Surgical History:  Procedure Laterality Date   ABDOMINAL SURGERY     laparoscopy   BIOPSY N/A 03/24/2014   Procedure: GASTRIC BIOPSIES;  Surgeon: Danie Binder, MD;  Location: [facility] ORS;  Service: Endoscopy;  Laterality: N/A;   BIOPSY  10/28/2019   Procedure: BIOPSY;  Surgeon: Danie Binder, MD;  Location: [facility] ENDO SUITE;  Service: Endoscopy;;  gastric nodule   BREAST LUMPECTOMY Right    CESAREAN SECTION     X2   COLONOSCOPY  2006   internal hemorrhoids   COLONOSCOPY WITH PROPOFOL N/A 03/24/2014   Dr. [name]: 2 tubular adenomas removed, hemorrhoids  COLONOSCOPY WITH PROPOFOL N/A 10/28/2019   Fields: External and internal hemorrhoids, 8 polyps ranging from 2 to 5 mm in size removed from the colon.  Multiple tubular adenomas.  Next colonoscopy in 3 years.   CYSTOSCOPY W/ RETROGRADES  01/23/2012   Procedure: CYSTOSCOPY WITH RETROGRADE PYELOGRAM;  Surgeon: Marissa Nestle, MD;  Location: [facility] ORS;  Service: Urology;  Laterality: Left;   DILATATION & CURETTAGE/HYSTEROSCOPY WITH MYOSURE N/A 11/18/2014    Procedure: DILATATION & CURETTAGE/HYSTEROSCOPY WITH MYOSURE, resection of polyp;  Surgeon: Cheri Fowler, MD;  Location: [place] ORS;  Service: Gynecology;  Laterality: N/A;   DILATION AND CURETTAGE OF UTERUS     ESOPHAGOGASTRODUODENOSCOPY   08/24/2006   Dr. Veto Kemps erythema of the antrum without erosion or ulcers/Otherwise, normal esophagus without evidence of Barrett's path with chronic gastritis   ESOPHAGOGASTRODUODENOSCOPY (EGD) WITH PROPOFOL N/A 03/24/2014   Dr. [name]: gastritis   ESOPHAGOGASTRODUODENOSCOPY (EGD) WITH PROPOFOL N/A 10/28/2019   Fields: Esophagus appeared normal, empiric dilation due to history of dysphagia.  6 mm nodule seen in the gastric cardia.  Mild portal hypertensive gastropathy.  Nodule from the stomach biopsied and showed mild chronic gastritis, no H. pylori.   POLYPECTOMY N/A 03/24/2014   Procedure: POLYPECTOMY;  Surgeon: Danie Binder, MD;  Location: [facility];  Service: Endoscopy;  Laterality: N/A;   POLYPECTOMY  10/28/2019   Procedure: POLYPECTOMY;  Surgeon: Danie Binder, MD;  Location: [facility];  Service: Endoscopy;;  hepatic flexure, ascending colon,sigmoid colon, rectal   REMOVAL OF STONES  01/23/2012   Procedure: REMOVAL OF STONES;  Surgeon: Marissa Nestle, MD;  Location: [facility];  Service: Urology;  Laterality: N/A;   SAVORY DILATION N/A 10/28/2019   Procedure: SAVORY DILATION;  Surgeon: Danie Binder, MD;  Location: [facility];  Service: Endoscopy;  Laterality: N/A;   TUBAL LIGATION       reports that she quit smoking about 23 years ago. Her smoking use included cigarettes. She has never used smokeless tobacco. She reports that she does not currently use alcohol. She reports that she does not use drugs.  Allergies  Allergen Reactions   Azithromycin Hives   Lisinopril Cough   Morphine Hives   Sulfa Antibiotics Other (See Comments)    Yeast infection    Family History  Problem Relation Age of Onset   Heart attack Mother        CABG    Stroke Mother    Hypertension Mother    Cancer Mother    Diabetes Mother    Heart attack Father        CABG   Hypertension Father    Mesothelioma Father    Heart attack Brother    Diabetes Brother    Depression Brother    Hyperlipidemia Brother    Diabetes Maternal Grandmother    Diabetes Maternal Grandfather    Cancer Paternal Grandmother    Diabetes Brother    Hyperlipidemia Brother    Wilson's disease Other        2 nieces, one died in her 28s   Colon cancer Neg Hx      Prior to Admission medications   Medication Sig Start Date End Date Taking? Authorizing Provider  acetaminophen (TYLENOL) 325 MG tablet Take 2 tablets (650 mg total) by mouth every 6 (six) hours as needed for mild pain, headache or fever (or Fever >/= 101). 04/27/22  Yes Emokpae, Courage, MD  albuterol (VENTOLIN HFA) 108 (90 Base) MCG/ACT inhaler Inhale 1-2 puffs into  the lungs every 6 (six) hours as needed for wheezing or shortness of breath. 11/08/19  Yes Long, Wonda Olds, MD  amoxicillin-clavulanate (AUGMENTIN) 875-125 MG tablet Take 1 tablet by mouth 2 (two) times daily for 5 days. 04/27/22 05/02/22 Yes Emokpae, Courage, MD  aspirin EC 81 MG tablet Take 81 mg by mouth daily. Swallow whole.   Yes [provider]  budesonide-formoterol (SYMBICORT) 80-4.5 MCG/ACT inhaler Take 2 puffs first thing in am and then another 2 puffs about 12 hours later. Patient taking differently: Inhale 2 puffs into the lungs 2 (two) times daily. Take 2 puffs first thing in am and then another 2 puffs about 12 hours later. 11/25/21  Yes Tanda Rockers, MD  Calcium Carb-Cholecalciferol (CALCIUM 600 + D PO) Take 1 tablet by mouth daily.   Yes [provider]  Cholecalciferol (VITAMIN D3) 2000 units TABS Take 2,000 Units by mouth daily.    Yes [provider]  clotrimazole (MYCELEX) 10 MG troche Take 10 mg by mouth 5 (five) times daily.   Yes [provider]  diltiazem (CARDIZEM CD) 120 MG 24 hr capsule  Take 1 capsule (120 mg total) by mouth daily. 04/27/22 04/27/23 Yes Emokpae, Courage, MD  doxycycline (VIBRA-TABS) 100 MG tablet Take 1 tablet (100 mg total) by mouth 2 (two) times daily for 5 days. 04/27/22 05/02/22 Yes Emokpae, Courage, MD  DULoxetine (CYMBALTA) 60 MG capsule Take 60 mg by mouth daily. 07/15/21  Yes [provider]  furosemide (LASIX) 40 MG tablet Take 1 tablet (40 mg total) by mouth daily. 04/27/22 10/24/22 Yes Emokpae, Courage, MD  inFLIXimab (REMICADE IV) Inject 1 Syringe into the vein every 3 (three) months.   Yes [provider]  lactulose, encephalopathy, (CHRONULAC) 10 GM/15ML SOLN Take 30 mLs (20 g total) by mouth 2 (two) times daily. Goal is 2 to 3 soft/mushy BMs, Ok to take additional dose if No BM in 24 hrs 04/27/22  Yes Emokpae, Courage, MD  levothyroxine (SYNTHROID) 75 MCG tablet Take 75 mcg by mouth daily. 12/26/21  Yes [provider]  meclizine (ANTIVERT) 25 MG tablet Take 25 mg by mouth 3 (three) times daily as needed for dizziness.   Yes [provider]  Melatonin 10 MG CAPS Take 10 mg by mouth at bedtime as needed (sleep).   Yes [provider]  metFORMIN (GLUCOPHAGE-XR) 500 MG 24 hr tablet Take 1 tablet (500 mg total) by mouth 2 (two) times daily with a meal. 04/27/22  Yes Emokpae, Courage, MD  metoprolol tartrate (LOPRESSOR) 25 MG tablet Take 1 tablet (25 mg total) by mouth 2 (two) times daily. 04/27/22  Yes Emokpae, Courage, MD  midodrine (PROAMATINE) 5 MG tablet Take 1 tablet (5 mg total) by mouth 2 (two) times daily with a meal. 04/27/22  Yes Emokpae, Courage, MD  pantoprazole (PROTONIX) 40 MG tablet Take 40 mg by mouth 2 (two) times daily.   Yes [provider]  pilocarpine (SALAGEN) 5 MG tablet Take 5 mg by mouth 2 (two) times daily.   Yes [provider]  potassium chloride (KLOR-CON) 10 MEQ tablet Take 1 tablet (10 mEq total) by mouth daily. Take While taking Lasix/furosemide 04/27/22  Yes Emokpae,  Courage, MD  rifaximin (XIFAXAN) 550 MG TABS tablet Take 1 tablet (550 mg total) by mouth 2 (two) times daily. 04/27/22  Yes Emokpae, Courage, MD  rosuvastatin (CRESTOR) 10 MG tablet Take 10 mg by mouth daily.   Yes [provider]  spironolactone (ALDACTONE) 50 MG  tablet Take 1 tablet (50 mg total) by mouth daily. 04/27/22 04/27/23 Yes Emokpae, Courage, MD  sucralfate (CARAFATE) 1 g tablet Take 1 g by mouth 4 (four) times daily. 01/25/22  Yes [provider]  blood glucose meter kit and supplies Dispense based on patient and insurance preference. Use up to four times daily as directed. (FOR ICD-10 E10.9, E11.9). 09/22/21   Murlean Iba, MD    Physical Exam: Vitals:   04/28/22 1532 04/28/22 1545 04/28/22 1600 04/28/22 1615  BP:  (!) 118/52 (!) 110/57 127/64  Pulse:  82 83 85  Resp:  (!) 31 (!) 31 (!) 30  Temp: 99.2 F (37.3 C)     TempSrc: Axillary     SpO2:  96% 96% 96%    Constitutional: NAD, calm, comfortable Vitals:   04/28/22 1532 04/28/22 1545 04/28/22 1600 04/28/22 1615  BP:  (!) 118/52 (!) 110/57 127/64  Pulse:  82 83 85  Resp:  (!) 31 (!) 31 (!) 30  Temp: 99.2 F (37.3 C)     TempSrc: Axillary     SpO2:  96% 96% 96%   Eyes: PERRL, lids and conjunctivae normal ENMT: Mucous membranes are moist. Posterior pharynx clear of any exudate or lesions.Normal dentition.  Neck: normal, supple, no masses, no thyromegaly Respiratory: Diminished breathing sound bilaterally, diffused wheezing, fine crackles to bilateral mid levels right> left, increasing breathing effort, positive signs of using accessory muscle.  Cardiovascular: Regular rate and rhythm, no murmurs / rubs / gallops. No extremity edema. 2+ pedal pulses. No carotid bruits.  Abdomen: no tenderness, no masses palpated. No hepatosplenomegaly. Bowel sounds positive.  Musculoskeletal: no clubbing / cyanosis. No joint deformity upper and lower extremities. Good ROM, no contractures. Normal muscle tone.   Skin: no rashes, lesions, ulcers. No induration Neurologic: No facial droops, moving all limbs, following simple commands Psychiatric: Awake, lethargic    Labs on Admission: I have personally reviewed following labs and imaging studies  CBC: Recent Labs  Lab 04/24/22 0400 04/25/22 0330 04/26/22 0350 04/27/22 0321 04/28/22 1429  WBC 7.6 14.9* 12.3* 8.4 11.8*  NEUTROABS  --   --   --   --  8.7*  HGB 9.7* 9.6* 10.0* 9.1* 9.8*  HCT 29.9* 29.4* 31.2* 28.3* 30.4*  MCV 97.4 97.0 99.0 99.3 101.0*  PLT 109* 144* 205 176 408   Basic Metabolic Panel: Recent Labs  Lab 04/23/22 0511 04/24/22 0400 04/25/22 0330 04/25/22 1703 04/26/22 0350 04/26/22 0401 04/27/22 0321 04/28/22 1429  NA 132* 132* 137 135  --  136 135 132*  K 3.6 3.6 3.1* 3.5  --  4.3 3.5 4.0  CL 101 102 103 104  --  110 108 104  CO2 26 26 26 27   --  25 25 22   GLUCOSE 111* 162* 124* 94  --  92 115* 98  BUN 9 11 17  24*  --  25* 22 16  CREATININE 0.57 0.57 0.60 0.69  --  0.65 0.64 0.72  CALCIUM 8.3* 8.7* 9.5 9.5  --  9.4 9.1 9.0  MG 1.8 1.8 2.0 1.8 2.1  --   --   --   PHOS 3.1 2.8 3.3 3.3  --  3.1  --   --    GFR: Estimated Creatinine Clearance: 86 mL/min (by C-G formula based on SCr of 0.72 mg/dL). Liver Function Tests: Recent Labs  Lab 04/23/22 0511 04/24/22 0400 04/25/22 0330 04/25/22 1703 04/26/22 0401 04/27/22 0321 04/28/22 1429  AST 46* 39 31  --   --  28 42*  ALT 21 21 19   --   --  16 18  ALKPHOS 77 78 69  --   --  57 61  BILITOT 1.7* 1.7* 1.3*  --   --  1.4* 1.4*  PROT 8.7* 9.7* 9.8*  --   --  8.5* 9.2*  ALBUMIN 2.1* 2.5* 3.0* 2.7* 2.5* 2.2* 2.3*   No results for input(s): "LIPASE", "AMYLASE" in the last 168 hours. Recent Labs  Lab 04/22/22 0347 04/23/22 0511 04/25/22 0330 04/26/22 0401 04/28/22 1423  AMMONIA 58* 27 47* 40* 25   Coagulation Profile: Recent Labs  Lab 04/24/22 0400 04/25/22 0330 04/26/22 0350 04/27/22 0321  INR 1.5* 1.6* 1.7* 1.8*   Cardiac Enzymes: No results  for input(s): "CKTOTAL", "CKMB", "CKMBINDEX", "TROPONINI" in the last 168 hours. BNP (last 3 results) No results for input(s): "PROBNP" in the last 8760 hours. HbA1C: No results for input(s): "HGBA1C" in the last 72 hours. CBG: Recent Labs  Lab 04/23/22 1122 04/23/22 1554  GLUCAP 96 138*   Lipid Profile: No results for input(s): "CHOL", "HDL", "LDLCALC", "TRIG", "CHOLHDL", "LDLDIRECT" in the last 72 hours. Thyroid Function Tests: No results for input(s): "TSH", "T4TOTAL", "FREET4", "T3FREE", "THYROIDAB" in the last 72 hours. Anemia Panel: No results for input(s): "VITAMINB12", "FOLATE", "FERRITIN", "TIBC", "IRON", "RETICCTPCT" in the last 72 hours. Urine analysis:    Component Value Date/Time   COLORURINE YELLOW 04/22/2022 1430   APPEARANCEUR CLEAR 04/22/2022 1430   APPEARANCEUR Clear 03/08/2022 1549   LABSPEC 1.016 04/22/2022 1430   PHURINE 5.0 04/22/2022 1430   GLUCOSEU NEGATIVE 04/22/2022 1430   HGBUR SMALL (A) 04/22/2022 1430   BILIRUBINUR NEGATIVE 04/22/2022 1430   BILIRUBINUR Negative 03/08/2022 1549   KETONESUR NEGATIVE 04/22/2022 1430   PROTEINUR NEGATIVE 04/22/2022 1430   UROBILINOGEN negative (A) 07/02/2019 0932   UROBILINOGEN 0.2 04/15/2015 0028   NITRITE NEGATIVE 04/22/2022 1430   LEUKOCYTESUR SMALL (A) 04/22/2022 1430    Radiological Exams on Admission: DG Chest Port 1 View  Result Date: 04/28/2022 CLINICAL DATA:  Shortness of breath.  On CPAP. EXAM: PORTABLE CHEST 1 VIEW COMPARISON:  Radiographs 04/23/2022 and 04/21/2022. CT 04/23/2022 and 09/20/2021. Abdominal CT 04/24/2022. FINDINGS: 1455 hours. The heart is enlarged, but stable. A moderate-sized right pleural effusion appears slightly smaller. However, there are worsening diffuse airspace opacities in both lungs, right greater than left. No evidence of pneumothorax. No acute osseous findings are evident. Telemetry leads overlie the chest. IMPRESSION: 1. Worsening diffuse bilateral airspace opacities, right  greater than left, suspicious for multilobar pneumonia. 2. Slightly decreased right pleural effusion. Electronically Signed   By: Richardean Sale M.D.   On: 04/28/2022 15:05    EKG: Independently reviewed.  Normal sinus rhythm, no acute ST changes.  Assessment/Plan Principal Problem:   CHF (congestive heart failure) (HCC) Active Problems:   Acute on chronic diastolic CHF (congestive heart failure) ([place])  (please populate well all problems here in Problem List. (For example, if patient is on BP meds at home and you resume or decide to hold them, it is a problem that needs to be her. Same for CAD, COPD, HLD and so on)  Acute on chronic hypoxic respiratory failure -Probably multiple factorial, physical exam and clinically, patient still has significant symptoms signs of fluid overload with bilateral crackles and bilateral pleural effusions.  Resume IV Lasix, it appears that patient received 80 mg IV Lasix twice daily on last admission with sufficient diuresis effect.  We also start patient on IV albumin to boost diuresis  effect as she has chronic hypoalbuminemia from cirrhosis. -Multifocal pneumonia cannot be ruled out as patient has been treated for last 5 days of antibiotics of this pneumonia.  Agree with escalate coverage to cefepime and continue doxycycline until her breathing status more stabilized. -On physical exam it appears that she also has COPD exacerbation with active wheezing, will treat with short course of IV Solu-Medrol, start DuoNeb every [place], continue Dulera. -Given her body habitus, x-ray does not have sufficient penetration, for better characterize her lung and pleural space condition, ordered CT chest without contrast. -ABG -Continue BiPAP, admit to PCU for close monitoring. -Other Ddx, it appeared that she also had platypnea yesterday, might also has some elements of hepatopulmonary syndrome, checking ABG and continue BiPAP support.  SIRS -Tachypnea and hypoxia, will check  lactic acid -Antibiotics as above.  Acute on chronic diastolic CHF decompensation -As above -Given her history of cirrhosis, she probably responds less to p.o. Lasix than IV Lasix.  Consider discharge home on torsemide instead of p.o. Lasix.  Multifocal PNA -Escalate coverage for HCAP as above.  Acute COPD exacerbation -As above -Gold stage II on PFT in 2017.  Acute on chronic cirrhosis decompensation -Chronically elevated INR/coagulopathy, check INR, 1 dose of p.o. vitamin K given. No chemical DVT prophylaxis, start SCD -Ammonia level within normal limits, continue current lactulose regimen continue start rifaximin -Hypoalbuminemia treated as above  PAF -Sinus rhythm, hold off Cardizem given borderline low blood pressure right now -Continue beta-blocker -Was seen by cardiology on last admission and recommended against systemic anticoagulation, outpatient [facility] evaluation.  Morbid obesity -BMI= 44, recommend outpatient bariatric evaluation.  Hypothyroidism, GERD, HLD -Stable, continue home meds  DVT prophylaxis: SCD Code Status: Full code Family Communication: Daughters at bedside Disposition Plan: Patient is sick with hypoxia requiring BiPAP, complicated CHF pneumonia and COPD patient, expect more than 2 midnight hospital stay. Consults called: None Admission status: PCU   Lequita Halt MD [facility] Pager [phone] 04/28/2022, 4:34 PM

## 2022-04-28 NOTE — Progress Notes (Signed)
Pharmacy Antibiotic Note  Yolanda [name] is a 66 y.o. female admitted on 04/28/2022 with pneumonia.  Patient was just discharged 10/19 after admission for acute respiratory distress/chronic hypoxic resp failure and pneumonia. Pharmacy has been consulted for cefepime dosing. Patient just completed 5d doxycycline.   ClCr 85 ml/min.  Plan: Cefepime 2g q8 hr  Monitor cultures, clinical status, renal function Narrow abx as able and f/u duration     Temp (24hrs), Avg:99.2 F (37.3 C), Min:99.2 F (37.3 C), Max:99.2 F (37.3 C)  Recent Labs  Lab 04/24/22 0400 04/25/22 0330 04/25/22 1703 04/26/22 0350 04/26/22 0401 04/27/22 0321 04/28/22 1424 04/28/22 1429  WBC 7.6 14.9*  --  12.3*  --  8.4  --  11.8*  CREATININE 0.57 0.60 0.69  --  0.65 0.64  --  0.72  LATICACIDVEN  --   --   --   --   --   --  1.8  --     Estimated Creatinine Clearance: 86 mL/min (by C-G formula based on SCr of 0.72 mg/dL).    Allergies  Allergen Reactions   Azithromycin Hives   Lisinopril Cough   Morphine Hives   Sulfa Antibiotics Other (See Comments)    Yeast infection    Antimicrobials this admission: CTX 10/11 >> 10/15 Doxycycline 10/11, 10/15>> 10/20 Fluconazole x1 10/15 MTZ 10/15>> 10/17 Vancomycin x1 10/15 Cefepime 10/20 >>     Microbiology results: 10/11 BCx: ngtd 10/11 UCx: >199k col E Aerogenes   10/15 MRSA PCR: neg  Thank you for allowing pharmacy to be a part of this patient's care  Benetta Spar, PharmD, BCPS, [place] Clinical Pharmacist  Please check AMION for all [place] phone numbers After 10:00 PM, call [place] [phone]

## 2022-04-28 NOTE — Consult Note (Signed)
   PCCM Brief Consult Note  S:  Called to Yolanda [name] room by Dr. Roosevelt Locks for hypoxia.   Pertinent past medical history of Yolanda [name] cirrhosis, HFpEF, obesity, PE not on AC, A-fib not on AC,  OSA, hypertension, anxiety, depression, GERD, hypothyroidism  Patient with recent admission to [facility] from 10/11-11/19.  Patient on 3 L O2 at baseline.  After discharge patient up and down to toilet due to Lasix and lactulose.  Per family patient hypoxic after each trip.  Hypoxia continue to worsen throughout the day leading to presentation to [facility].  Patient started on BiPAP at [facility]. ABG 7.34/42/73/23 on 60%.  Concern for inability to tolerate laying flat for CT scan.   O: Blood pressure 133/64, pulse 86, temperature 99.2 F (37.3 C), temperature source Axillary, resp. rate (!) 29, last menstrual period 07/11/2007, SpO2 97 %.    Vent Mode: BIPAP FiO2 (%):  [40 %-60 %] 60 % PEEP:  [8 cmH20] 8 cmH20  No intake or output data in the 24 hours ending 04/28/22 1939 There were no vitals filed for this visit.  Exam: General: In bed, NAD, appears comfortable, on bipap HEENT: MM pink/moist, anicteric, atraumatic Neuro: RASS 0, PERRL 1m, GCS 15 CV: S1S2, NSR, no m/r/g appreciated PULM:  air movement in all lobes lobes, some crackles, trachea midline, chest expansion symmetric GI: rounded, bsx4 active, non-tender   Extremities: warm/dry, trace pretibial edema, capillary refill less than 3 seconds  Skin:  no rashes or lesions noted A: P:  Acute on chronic respiratory failure with hypoxia Acute on chronic diastolic CHF decompensation NASH cirrhosis Seen by Dr. ELoanne Drillingand I in the ED. Family feels much improved mental status post bipap. Made 750 ml of urine in ED. Feel that hypoxia is secondary to patient up and down at home in the setting of recent admission with pneumonia, HF, and cirrhosis. -Continue BiPAP. No indication for ICU at this time. Continue BiPAP overnight, qhs, and with naps.   -Agree with IV solumedrol, duoneb, dulera -Feel safe to get CT scan of chest. -Agree with Abx plan at this time, consider deescalating based on patient progress -Agree with lasix, continue diruesis as able -Continue lactulose  Discussed with Dr. ELoanne Drilling PCCM will sign off. Please call PCCM at [phone] with questions or if the patient decompensates.   TRedmond School, MSN, APRN, AGACNP-BC [facility]  04/28/2022 , 7:39 PM  Please see [email or web address] for pager details  If no response, please call [phone] After hours, please call [place] at [phone]

## 2022-04-28 NOTE — Progress Notes (Signed)
Pt transported from ED09 to CT, then from CT to 3E29 without any complications. RN at bedside.

## 2022-04-28 NOTE — Progress Notes (Signed)
RT obtained ABG @1657 . Results as follows: 7.34/42.1/73/23.1. RT increased FiO2 to 60%. RT will continue to monitor as needed.

## 2022-04-29 DIAGNOSIS — I10 Essential (primary) hypertension: Secondary | ICD-10-CM

## 2022-04-29 DIAGNOSIS — E1169 Type 2 diabetes mellitus with other specified complication: Secondary | ICD-10-CM

## 2022-04-29 DIAGNOSIS — E871 Hypo-osmolality and hyponatremia: Secondary | ICD-10-CM

## 2022-04-29 DIAGNOSIS — E785 Hyperlipidemia, unspecified: Secondary | ICD-10-CM

## 2022-04-29 DIAGNOSIS — N179 Acute kidney failure, unspecified: Secondary | ICD-10-CM | POA: Diagnosis not present

## 2022-04-29 DIAGNOSIS — E0590 Thyrotoxicosis, unspecified without thyrotoxic crisis or storm: Secondary | ICD-10-CM

## 2022-04-29 DIAGNOSIS — K7581 Nonalcoholic steatohepatitis (NASH): Secondary | ICD-10-CM

## 2022-04-29 DIAGNOSIS — I480 Paroxysmal atrial fibrillation: Secondary | ICD-10-CM | POA: Diagnosis not present

## 2022-04-29 DIAGNOSIS — K219 Gastro-esophageal reflux disease without esophagitis: Secondary | ICD-10-CM

## 2022-04-29 DIAGNOSIS — I5033 Acute on chronic diastolic (congestive) heart failure: Secondary | ICD-10-CM | POA: Diagnosis not present

## 2022-04-29 LAB — BASIC METABOLIC PANEL
Anion gap: 6 (ref 5–15)
BUN: 18 mg/dL (ref 8–23)
CO2: 22 mmol/L (ref 22–32)
Calcium: 9.2 mg/dL (ref 8.9–10.3)
Chloride: 105 mmol/L (ref 98–111)
Creatinine, Ser: 0.76 mg/dL (ref 0.44–1.00)
GFR, Estimated: >60 mL/min (ref >60)
Glucose, Bld: 136 mg/dL — ABNORMAL HIGH (ref 70–99)
Potassium: 4 mmol/L (ref 3.5–5.1)
Sodium: 133 mmol/L — ABNORMAL LOW (ref 135–145)

## 2022-04-29 LAB — GLUCOSE, CAPILLARY
Glucose-Capillary: 141 mg/dL — ABNORMAL HIGH (ref 70–99)
Glucose-Capillary: 136 mg/dL — ABNORMAL HIGH (ref 70–99)
Glucose-Capillary: 145 mg/dL — ABNORMAL HIGH (ref 70–99)

## 2022-04-29 MED ORDER — SPIRONOLACTONE 25 MG PO TABS
25.0000 mg | ORAL_TABLET | Freq: Every day | ORAL | Status: DC
  Administered 2022-04-30: 25 mg via ORAL
  Filled 2022-04-29: qty 1

## 2022-04-29 MED ORDER — ALPRAZOLAM 0.5 MG PO TABS
1.0000 mg | ORAL_TABLET | Freq: Three times a day (TID) | ORAL | Status: DC | PRN
  Administered 2022-04-29 – 2022-04-30 (×3): 1 mg via ORAL
  Filled 2022-04-29 (×3): qty 2

## 2022-04-29 NOTE — Assessment & Plan Note (Addendum)
Patient with metoprolol Positive hypovolemia, continue IV fluids with caution.

## 2022-04-29 NOTE — Assessment & Plan Note (Addendum)
Patient with no clinical signs of ascites, no signs of decompensated liver failure.  Elevated liver enzymes due to congested hepatopathy.  Mild elevated ammonia but dose not correlate with patient's mentation, no clinical signs of hepatic encephalopathy.   Continue with lactulose, ok to discontinue rifaximin   Acute metabolic encephalopathy Her encephalopathy has resolved.  Continue with Pt and OT.

## 2022-04-29 NOTE — Assessment & Plan Note (Addendum)
\  hypoglycemia  Capillary glucose 85, 69, and 78 Fasting glucose is 90. Plan to discontinue insulin therapy and check capillary glucose as needed.  Continue to encourage po intake,   Continue with statin therapy.

## 2022-04-29 NOTE — Progress Notes (Addendum)
Progress Note   Patient: Yolanda [name] RKY:[id number] DOB: September 21, 1955 DOA: 04/28/2022     1 DOS: the patient was seen and examined on 04/29/2022   Brief hospital course: Mrs. Polson was admitted to the hospital with the working diagnosis of decompensated heart failure.   66 yo female with the past medical history of NASH/ cirrhosis, paroxysmal atrial fibrillation, heart failure, COPD and obesity who presented with dyspnea. She was discharged from the hospital ([facility]) one day prior, for heart failure and pneumonia and urinary tract infection, she was in the hospital for 8 days. Patient at home was noted to have worsening oxygen desaturation down to the 60's and increased work of breathing that prompted her transfer to the ED. At the time she reached the ED she was in respiratory distress and was placed on non invasive mechanical ventilation. Her blood pressure was 118/52, HR 82, RR 31 and 02 saturation 96%, lungs with decreased breath sounds, diffuse wheezing and rales more right than left, increased work of breathing, heart with S1 and S2 present and rhythmic, abdomen with no distention, no lower extremity edema, patient was lethargic  but non focal.   ABG 7.34/ 42/ 73/ 24/ 93% Na 132, K 4,0 CL 104, bicarbonate 22, glucose 98, bun 16, cr 0.72  AST 42 ALT 18 BNP 103 Wbc 11.8 hgb 9.8 plt 222  Chest radiograph with cardiomegaly, bilateral hilar vascular congestion, bilateral interstitial infiltrates with more dense at the right lower lobe.   EKG 86 bpm, normal axis, normal qtc, atrial fibrillation rhythm, no significant ST segment or T wave changes.      Assessment and Plan: * Acute on chronic diastolic CHF (congestive heart failure) (HCC) Echocardiogram with preserved LV systolic function EF 70 to 31%, RV systolic function preserved, no significant valvular disease.   CT chest with bilateral ground glass opacities, predominant central location. Small bilateral pleural effusions, more  right than left. New small pericardial effusion.   Not documented urine output.  With diuresis she has been liberated from mechanical ventilation.   Plan to continue with aggressive diuresis with furosemide and spironolactone Hold on SGLT 2 inh due to fungal infections.   Acute hypoxemic respiratory failure due to cardiogenic pulmonary edema.  Patient has been treated with antibiotic therapy for 8 days, her procalcitonin is 0,31 on 04/25/22.  She has been afebrile and no significant leukocytosis. Will hold on antibiotic therapy for now with close monitoring.  For now ruled out recurrent pneumonia.   Essential hypertension Systolic blood pressure 517 to 188 mmHg Plan to continue diuresis with IV furosemide  Patient on midodrine for blood pressure support.   Paroxysmal atrial fibrillation with RVR ([employer]) Patient is on metoprolol for rate control but not on anticoagulation.  Prior cardiology consultation has recommended to hold on anticoagulation in the setting of liver cirrhosis and high risk of bleeding.    Hyponatremia Renal function with stable serum cr at 0,76, K is 4,0 and serum bicarbonate at 22. Na 133.  Plan to continue diuresis with IV furosemide, follow up electrolytes in am.   Liver cirrhosis secondary to NASH [facility]) Patient with no clinical signs of ascites, no signs of decompensated liver failure.  No encephalopathy or coagulopathy.  Elevated liver enzymes due to congested hepatopathy.   Continue with rifaximin and lactulose.   GERD (gastroesophageal reflux disease) Continue antiacid therapy.   Hypothyroidism Continue with levothyroxine   Type 2 diabetes mellitus with hyperlipidemia (HCC) Continue insulin sliding scale for glucose cover and monitoring  Fasting glucose this am 136 mg/dl Continue with metformin   Continue with statin therapy.   Class 3 obesity (HCC) Calculated BMI is 44.3 Patient with very poor physical functional status, uses a walker for  ambulation.   Depression and anxiety,  Added as needed alprazolam, continue with daily duloxetine.         Subjective: Patient with improvement in dyspnea but not back to baseline, no chest pain, positive anxiety   Physical Exam: Vitals:   04/29/22 0556 04/29/22 0752 04/29/22 0800 04/29/22 0830  BP:    118/70  Pulse:    74  Resp:   20 18  Temp:    97.8 F (36.6 C)  TempSrc:    Oral  SpO2: 98% 100%  100%  Weight:       Neurology awake and alert ENT with mild pallor Cardiovascular with S1 and S2 present irregularly irregular with no gallops, rubs or murmurs Respiratory with positive rales and expiratory wheezing, scattered rhonchi Abdomen protuberant but not distended no ascites. Positive lower extremity edema ++  Data Reviewed:    Family Communication: I spoke with patient's daughter at the bedside, we talked in detail about patient's condition, plan of care and prognosis and all questions were addressed.    Disposition: Status is: Inpatient Remains inpatient appropriate because: IV diuretic therapy   Planned Discharge Destination: Home  Author: Tawni Millers, MD 04/29/2022 4:57 PM  For on call review [email or web address].

## 2022-04-29 NOTE — Assessment & Plan Note (Addendum)
Hyponatremia, hypokalemia/ hyperkalemia, hypomagnesemia. Suspected acute tubular necrosis.   Renal US with no hydronephrosis, increased echogenicity at the cortical region suggesting medical renal diease.   Patient had 1000 ml IV bolus, and now on continuous infusion of isotonic saline.  Renal function with improvement in serum cr at 3,45 with K at 5,0 and serum bicarbonate at 27 Na is 134 and P 4,7  Follow up renal function in am, avoid hypotension and nephrotoxic medications,

## 2022-04-29 NOTE — Assessment & Plan Note (Signed)
Continue antiacid therapy  ?

## 2022-04-29 NOTE — Assessment & Plan Note (Addendum)
Patient converted back to sinus rhythm, will transition to oral amiodarone. Telemetry with sinus bradycardia in th 60 bpm range.

## 2022-04-29 NOTE — Assessment & Plan Note (Addendum)
Echocardiogram with preserved LV systolic function EF 70 to 15%, RV systolic function preserved, no significant valvular disease.   10/20 CT chest with bilateral ground glass opacities, predominant central location. Small bilateral pleural effusions, more right than left. New small pericardial effusion.   Orthostatic symptoms have improved.  Systolic blood pressure 830 and 90 over 60 and 40   Continue with metoprolol, No RAS inhibition due to worsening renal function.  Acute hypoxemic respiratory failure due to cardiogenic pulmonary edema.  Patient has completed antibiotic therapy for recurrent pneumonia, (present on admission)  Patient currently on a regular diet, no vomiting.  Dysphagia esophagogram with mild narrowing near the GE junction, not able to further assess. Repeat esophagogram today Improving leukocytosis, continue to hold antibiotic therapy.

## 2022-04-29 NOTE — Assessment & Plan Note (Addendum)
Calculated BMI is 44.3 Patient with very poor physical functional status, uses a walker for ambulation.   Depression and anxiety,  Continue with daily duloxetine.  Discontinue alprazolam.

## 2022-04-29 NOTE — Assessment & Plan Note (Signed)
Continue with levothyroxine

## 2022-04-29 NOTE — Hospital Course (Addendum)
Yolanda [name] was admitted to the hospital with the working diagnosis of decompensated heart failure.   66 yo female with the past medical history of NASH/ cirrhosis, paroxysmal atrial fibrillation, heart failure, COPD and obesity who presented with dyspnea. She was discharged from the hospital ([facility]) one day prior, for heart failure, pneumonia and urinary tract infection, she was in the hospital for 8 days. Patient at home was noted to have worsening oxygen desaturation down to the 60's and increased work of breathing that prompted her transfer to the ED. At the time she reached the ED she was in respiratory distress and was placed on non invasive mechanical ventilation. Her blood pressure was 118/52, HR 82, RR 31 and 02 saturation 96%, lungs with decreased breath sounds, diffuse wheezing and rales more right than left, increased work of breathing, heart with S1 and S2 present and rhythmic, abdomen with no distention, no lower extremity edema, patient was lethargic  but non focal.   ABG 7.34/ 42/ 73/ 24/ 93% Na 132, K 4,0 CL 104, bicarbonate 22, glucose 98, bun 16, cr 0.72  AST 42 ALT 18 BNP 103 Wbc 11.8 hgb 9.8 plt 222  Chest radiograph with cardiomegaly, bilateral hilar vascular congestion, bilateral interstitial infiltrates with more dense at the right lower lobe.   EKG 86 bpm, normal axis, normal qtc, atrial fibrillation rhythm, no significant ST segment or T wave changes.   Patient continue with high oxygen requirements and using Bipap intermittently.   10/22 worsening respiratory failure and required intubation.  Placed on broad spectrum antibiotic therapy with Zosyn.  10/25 liberated from invasive mechanical ventilation, patient with encephalopathy.  10/26 atrial fibrillation with RVR, required amiodarone infusion.  10/28 transfer back to [facility].  10/29 continue with uncontrolled atrial fibrillation.  11/02 patient converted to sinus rhythm.  11/03 clinically improving but renal function  still not stable.  11/04 trial of IV fluids bolus for worsening renal function.  11/05 continue worsening renal function, suspected ATN. Started on IV fluids, developed atrial fibrillation and placed on amiodarone drip. 11/06 Patient placed on IV fluids with improvement in renal function, continue with orthostatic symptoms.  Converted to sinus rhythm.  11/07 renal function is improving. Start discharge planning.

## 2022-04-30 ENCOUNTER — Inpatient Hospital Stay (HOSPITAL_COMMUNITY): Payer: Medicare Other

## 2022-04-30 DIAGNOSIS — K7460 Unspecified cirrhosis of liver: Secondary | ICD-10-CM | POA: Diagnosis not present

## 2022-04-30 DIAGNOSIS — I10 Essential (primary) hypertension: Secondary | ICD-10-CM | POA: Diagnosis not present

## 2022-04-30 DIAGNOSIS — I480 Paroxysmal atrial fibrillation: Secondary | ICD-10-CM | POA: Diagnosis not present

## 2022-04-30 DIAGNOSIS — K7581 Nonalcoholic steatohepatitis (NASH): Secondary | ICD-10-CM | POA: Diagnosis not present

## 2022-04-30 DIAGNOSIS — E871 Hypo-osmolality and hyponatremia: Secondary | ICD-10-CM | POA: Diagnosis not present

## 2022-04-30 DIAGNOSIS — I5033 Acute on chronic diastolic (congestive) heart failure: Secondary | ICD-10-CM | POA: Diagnosis not present

## 2022-04-30 DIAGNOSIS — E1169 Type 2 diabetes mellitus with other specified complication: Secondary | ICD-10-CM | POA: Diagnosis not present

## 2022-04-30 DIAGNOSIS — J9601 Acute respiratory failure with hypoxia: Secondary | ICD-10-CM

## 2022-04-30 LAB — POCT I-STAT 7, (LYTES, BLD GAS, ICA,H+H)
Acid-Base Excess: 0 mmol/L (ref 0.0–2.0)
Acid-Base Excess: 0 mmol/L (ref 0.0–2.0)
Bicarbonate: 28.4 mmol/L — ABNORMAL HIGH (ref 20.0–28.0)
Bicarbonate: 27.4 mmol/L (ref 20.0–28.0)
Calcium, Ion: 1.42 mmol/L — ABNORMAL HIGH (ref 1.15–1.40)
Calcium, Ion: 1.42 mmol/L — ABNORMAL HIGH (ref 1.15–1.40)
HCT: 35 % — ABNORMAL LOW (ref 36.0–46.0)
HCT: 30 % — ABNORMAL LOW (ref 36.0–46.0)
Hemoglobin: 11.9 g/dL — ABNORMAL LOW (ref 12.0–15.0)
Hemoglobin: 10.2 g/dL — ABNORMAL LOW (ref 12.0–15.0)
O2 Saturation: 99 %
O2 Saturation: 96 %
Patient temperature: 98.3
Patient temperature: 98.6
Potassium: 4.1 mmol/L (ref 3.5–5.1)
Potassium: 4 mmol/L (ref 3.5–5.1)
Sodium: 143 mmol/L (ref 135–145)
Sodium: 144 mmol/L (ref 135–145)
TCO2: 30 mmol/L (ref 22–32)
TCO2: 29 mmol/L (ref 22–32)
pCO2 arterial: 63 mmHg — ABNORMAL HIGH (ref 32–48)
pCO2 arterial: 54.7 mmHg — ABNORMAL HIGH (ref 32–48)
pH, Arterial: 7.261 — ABNORMAL LOW (ref 7.35–7.45)
pH, Arterial: 7.307 — ABNORMAL LOW (ref 7.35–7.45)
pO2, Arterial: 164 mmHg — ABNORMAL HIGH (ref 83–108)
pO2, Arterial: 95 mmHg (ref 83–108)

## 2022-04-30 LAB — BLOOD GAS, ARTERIAL
Acid-Base Excess: 2.1 mmol/L — ABNORMAL HIGH (ref 0.0–2.0)
Bicarbonate: 28.7 mmol/L — ABNORMAL HIGH (ref 20.0–28.0)
Drawn by: 614421
O2 Saturation: 96.3 %
Patient temperature: 37
pCO2 arterial: 52 mmHg — ABNORMAL HIGH (ref 32–48)
pH, Arterial: 7.35 (ref 7.35–7.45)
pO2, Arterial: 76 mmHg — ABNORMAL LOW (ref 83–108)

## 2022-04-30 LAB — GLUCOSE, CAPILLARY
Glucose-Capillary: 132 mg/dL — ABNORMAL HIGH (ref 70–99)
Glucose-Capillary: 131 mg/dL — ABNORMAL HIGH (ref 70–99)
Glucose-Capillary: 139 mg/dL — ABNORMAL HIGH (ref 70–99)
Glucose-Capillary: 141 mg/dL — ABNORMAL HIGH (ref 70–99)

## 2022-04-30 LAB — BASIC METABOLIC PANEL
Anion gap: 8 (ref 5–15)
BUN: 34 mg/dL — ABNORMAL HIGH (ref 8–23)
CO2: 24 mmol/L (ref 22–32)
Calcium: 9.5 mg/dL (ref 8.9–10.3)
Chloride: 104 mmol/L (ref 98–111)
Creatinine, Ser: 0.87 mg/dL (ref 0.44–1.00)
GFR, Estimated: >60 mL/min (ref >60)
Glucose, Bld: 143 mg/dL — ABNORMAL HIGH (ref 70–99)
Potassium: 3.6 mmol/L (ref 3.5–5.1)
Sodium: 136 mmol/L (ref 135–145)

## 2022-04-30 LAB — MAGNESIUM: Magnesium: 1.7 mg/dL (ref 1.7–2.4)

## 2022-04-30 MED ORDER — VANCOMYCIN HCL 2000 MG/400ML IV SOLN
2000.0000 mg | Freq: Once | INTRAVENOUS | Status: DC
  Filled 2022-04-30: qty 400

## 2022-04-30 MED ORDER — ETOMIDATE 2 MG/ML IV SOLN
INTRAVENOUS | Status: AC
  Filled 2022-04-30: qty 20

## 2022-04-30 MED ORDER — NOREPINEPHRINE 4 MG/250ML-% IV SOLN: 0.0000 ug/min | INTRAVENOUS | Status: DC

## 2022-04-30 MED ORDER — RIFAXIMIN 550 MG PO TABS
550.0000 mg | ORAL_TABLET | Freq: Two times a day (BID) | ORAL | Status: DC
  Administered 2022-04-30 – 2022-05-03 (×7): 550 mg
  Filled 2022-04-30 (×9): qty 1

## 2022-04-30 MED ORDER — ETOMIDATE 2 MG/ML IV SOLN
20.0000 mg | Freq: Once | INTRAVENOUS | Status: AC
  Administered 2022-04-30: 20 mg via INTRAVENOUS

## 2022-04-30 MED ORDER — DEXMEDETOMIDINE HCL IN NACL 400 MCG/100ML IV SOLN
0.4000 ug/kg/h | INTRAVENOUS | Status: DC
  Administered 2022-04-30 (×3): 1.2 ug/kg/h via INTRAVENOUS
  Administered 2022-05-01 – 2022-05-02 (×7): 1 ug/kg/h via INTRAVENOUS
  Administered 2022-05-02 (×2): 0.9 ug/kg/h via INTRAVENOUS
  Administered 2022-05-02: 1 ug/kg/h via INTRAVENOUS
  Filled 2022-04-30 (×4): qty 100
  Filled 2022-04-30: qty 200
  Filled 2022-04-30 (×7): qty 100

## 2022-04-30 MED ORDER — ROSUVASTATIN CALCIUM 5 MG PO TABS
10.0000 mg | ORAL_TABLET | Freq: Every day | ORAL | Status: DC
  Administered 2022-05-01 – 2022-05-03 (×3): 10 mg
  Filled 2022-04-30 (×4): qty 2

## 2022-04-30 MED ORDER — POTASSIUM CHLORIDE CRYS ER 20 MEQ PO TBCR
40.0000 meq | EXTENDED_RELEASE_TABLET | Freq: Once | ORAL | Status: DC
  Filled 2022-04-30: qty 2

## 2022-04-30 MED ORDER — PILOCARPINE HCL 5 MG PO TABS
5.0000 mg | ORAL_TABLET | Freq: Two times a day (BID) | ORAL | Status: DC
  Administered 2022-04-30 – 2022-05-03 (×7): 5 mg
  Filled 2022-04-30 (×9): qty 1

## 2022-04-30 MED ORDER — SUCRALFATE 1 GM/10ML PO SUSP
1.0000 g | Freq: Three times a day (TID) | ORAL | Status: DC
  Administered 2022-04-30 – 2022-05-04 (×14): 1 g
  Filled 2022-04-30 (×17): qty 10

## 2022-04-30 MED ORDER — FENTANYL 2500MCG IN NS 250ML (10MCG/ML) PREMIX INFUSION
INTRAVENOUS | Status: AC
  Administered 2022-04-30: 50 ug/h via INTRAVENOUS
  Filled 2022-04-30: qty 250

## 2022-04-30 MED ORDER — GUAIFENESIN 100 MG/5ML PO LIQD
15.0000 mL | Freq: Four times a day (QID) | ORAL | Status: DC
  Administered 2022-04-30 – 2022-05-07 (×21): 15 mL
  Filled 2022-04-30 (×24): qty 15

## 2022-04-30 MED ORDER — VANCOMYCIN HCL 1500 MG/300ML IV SOLN: 1500.0000 mg | INTRAVENOUS | Status: DC

## 2022-04-30 MED ORDER — SPIRONOLACTONE 25 MG PO TABS
25.0000 mg | ORAL_TABLET | Freq: Every day | ORAL | Status: DC
  Filled 2022-04-30: qty 1

## 2022-04-30 MED ORDER — PIPERACILLIN-TAZOBACTAM 3.375 G IVPB
3.3750 g | Freq: Three times a day (TID) | INTRAVENOUS | Status: AC
  Administered 2022-04-30 – 2022-05-07 (×21): 3.375 g via INTRAVENOUS
  Filled 2022-04-30 (×21): qty 50

## 2022-04-30 MED ORDER — POTASSIUM CHLORIDE 20 MEQ PO PACK
40.0000 meq | PACK | Freq: Once | ORAL | Status: AC
  Administered 2022-04-30: 40 meq
  Filled 2022-04-30: qty 2

## 2022-04-30 MED ORDER — SODIUM CHLORIDE 0.9 % IV SOLN: 2.0000 g | Freq: Three times a day (TID) | INTRAVENOUS | Status: DC

## 2022-04-30 MED ORDER — MELATONIN 5 MG PO TABS
10.0000 mg | ORAL_TABLET | Freq: Every evening | ORAL | Status: DC | PRN
  Administered 2022-05-03 – 2022-05-06 (×2): 10 mg
  Filled 2022-04-30 (×2): qty 2

## 2022-04-30 MED ORDER — CHLORHEXIDINE GLUCONATE CLOTH 2 % EX PADS
6.0000 | MEDICATED_PAD | Freq: Every day | CUTANEOUS | Status: DC
  Administered 2022-04-30 – 2022-05-12 (×13): 6 via TOPICAL

## 2022-04-30 MED ORDER — ASPIRIN 81 MG PO CHEW
81.0000 mg | CHEWABLE_TABLET | Freq: Every day | ORAL | Status: DC
  Administered 2022-05-01 – 2022-05-03 (×3): 81 mg
  Filled 2022-04-30 (×4): qty 1

## 2022-04-30 MED ORDER — FENTANYL CITRATE PF 50 MCG/ML IJ SOSY
PREFILLED_SYRINGE | INTRAMUSCULAR | Status: AC
  Filled 2022-04-30: qty 2

## 2022-04-30 MED ORDER — PANTOPRAZOLE 2 MG/ML SUSPENSION
40.0000 mg | Freq: Two times a day (BID) | ORAL | Status: DC
  Administered 2022-04-30 – 2022-05-04 (×8): 40 mg
  Filled 2022-04-30 (×8): qty 20

## 2022-04-30 MED ORDER — MIDAZOLAM HCL 2 MG/2ML IJ SOLN
INTRAMUSCULAR | Status: AC
  Filled 2022-04-30: qty 2

## 2022-04-30 MED ORDER — METOPROLOL TARTRATE 25 MG PO TABS
37.5000 mg | ORAL_TABLET | Freq: Two times a day (BID) | ORAL | Status: DC
  Filled 2022-04-30: qty 1

## 2022-04-30 MED ORDER — LEVOTHYROXINE SODIUM 75 MCG PO TABS
75.0000 ug | ORAL_TABLET | Freq: Every day | ORAL | Status: DC
  Administered 2022-05-01 – 2022-05-04 (×4): 75 ug
  Filled 2022-04-30 (×4): qty 1

## 2022-04-30 MED ORDER — ROCURONIUM BROMIDE 10 MG/ML (PF) SYRINGE
PREFILLED_SYRINGE | INTRAVENOUS | Status: AC
  Filled 2022-04-30: qty 10

## 2022-04-30 MED ORDER — ROCURONIUM BROMIDE 50 MG/5ML IV SOLN
100.0000 mg | Freq: Once | INTRAVENOUS | Status: AC
  Administered 2022-04-30: 100 mg via INTRAVENOUS
  Filled 2022-04-30: qty 10

## 2022-04-30 MED ORDER — HYDRALAZINE HCL 20 MG/ML IJ SOLN
10.0000 mg | Freq: Four times a day (QID) | INTRAMUSCULAR | Status: DC | PRN
  Administered 2022-04-30: 10 mg via INTRAVENOUS
  Filled 2022-04-30: qty 1

## 2022-04-30 MED ORDER — FUROSEMIDE 10 MG/ML IJ SOLN
10.0000 mg/h | INTRAVENOUS | Status: DC
  Administered 2022-04-30 – 2022-05-01 (×2): 10 mg/h via INTRAVENOUS
  Filled 2022-04-30 (×2): qty 20

## 2022-04-30 MED ORDER — FENTANYL 2500MCG IN NS 250ML (10MCG/ML) PREMIX INFUSION
0.0000 ug/h | INTRAVENOUS | Status: DC
  Administered 2022-05-01 – 2022-05-02 (×3): 200 ug/h via INTRAVENOUS
  Filled 2022-04-30 (×3): qty 250

## 2022-04-30 MED ORDER — LACTULOSE 10 GM/15ML PO SOLN
20.0000 g | Freq: Two times a day (BID) | ORAL | Status: DC
  Administered 2022-04-30 – 2022-05-02 (×4): 20 g
  Filled 2022-04-30 (×4): qty 30

## 2022-04-30 MED ORDER — ACETAMINOPHEN 325 MG PO TABS: 650.0000 mg | ORAL_TABLET | ORAL | Status: DC | PRN

## 2022-04-30 NOTE — Progress Notes (Signed)
eLink Physician-Brief Progress Note Patient Name: KESIA DALTO DOB: 1955/12/17 MRN: [id number]   Date of Service  04/30/2022  HPI/Events of Note  RT recommending vent setting changes as peak and plateau pressure pressure 40  eICU Interventions  Ok to decrease TV 400 (7 cc/kg), keep rate at 30 (I:E 1: 1.5) Repeat ABG and lower TV further to 6 cc/kg if PCO2 acceptable (permissive hypercapnea)     Intervention Category Intermediate Interventions: Other:  Judd Lien 04/30/2022, 8:31 PM

## 2022-04-30 NOTE — Progress Notes (Addendum)
Pharmacy Antibiotic Note  Yolanda [name] is a 66 y.o. female admitted on 04/28/2022 with pneumonia.  Pharmacy has been consulted for vancomycin and cefepime dosing. Patient is afebrile, on Bi-PAP and in respiratory distress. Previously on antibiotics from 10/11 to 10/19 for enterobacter aerogenes UTI and pneumonia at previous admission at [facility]. Scr 0.87 is trending up gradually, baseline ~0.6.   Plan: Vancomycin 2g IV x 1, then 1500 mg IV 24h (Scr 0.87, Vd 0.5, eAUC 523.8)  Cefepime 2g IV q8h Monitor for s/sx improvement, renal function, de-escalation as appropriate   Weight: 116 kg (255 lb 11.7 oz)  Temp (24hrs), Avg:98.1 F (36.7 C), Min:97.7 F (36.5 C), Max:98.5 F (36.9 C)  Recent Labs  Lab 04/24/22 0400 04/25/22 0330 04/25/22 1703 04/26/22 0350 04/26/22 0401 04/27/22 0321 04/28/22 1424 04/28/22 1429 04/28/22 1853 04/29/22 0044 04/30/22 0036  WBC 7.6 14.9*  --  12.3*  --  8.4  --  11.8*  --   --   --   CREATININE 0.57 0.60   < >  --  0.65 0.64  --  0.72  --  0.76 0.87  LATICACIDVEN  --   --   --   --   --   --  1.8  --  1.5  --   --    < > = values in this interval not displayed.    Estimated Creatinine Clearance: 78.5 mL/min (by C-G formula based on SCr of 0.87 mg/dL).    Allergies  Allergen Reactions   Azithromycin Hives   Lisinopril Cough   Morphine Hives   Sulfa Antibiotics Other (See Comments)    Yeast infection    Antimicrobials this admission: CTX 10/11 >> 10/15 Doxycycline 10/11, 10/15>> 10/20 Fluconazole x1 10/15 MTZ 10/15>> 10/17 Vancomycin x1 10/15, 10/22 >>  Cefepime 10/20 >> 10/2, 10/22 >>   Microbiology results: 10/11 BCx: ngtd 10/11 UCx: >199k col E Aerogenes   10/15 MRSA PCR: neg 10/22 MRSA PCR: ordered   Thank you for allowing pharmacy to be a part of this patient's care.  Eliseo Gum, PharmD PGY1 Pharmacy Resident   04/30/2022  2:28 PM

## 2022-04-30 NOTE — Progress Notes (Signed)
RT came to pt's room to give scheduled breathing tx and pt's daughter expressed concern she felt she was declining. Pt on bipap and spo2 had recently dropped significantly during movement to clean up after BM. Pt with eyes closed and not interactive. This RT asked pt's daughter if she would want to be intubated if she were to need it and her daughter stated for short term. This RT sent a secure chat to Dr Cathlean Sauer and requested a CCM consult and sent a message to CCM to let them know the patient was declining and needed to more than likely intubated. Rapid Response RN contacted as well. Dr Tacy Learn and Rapid Response team to bedside and Pt transported to [facility] for intubation and bronch

## 2022-04-30 NOTE — Procedures (Signed)
Central Venous Catheter Insertion Procedure Note  Yolanda [name]  [id number]  [birth year]  Date:04/30/22  Time:6:37 PM   Provider Performing:Kobi Aller   Procedure: Insertion of Non-tunneled Central Venous [id number]) with US guidance (30092)   Indication(s) Medication administration  Consent Risks of the procedure as well as the alternatives and risks of each were explained to the patient and/or caregiver.  Consent for the procedure was obtained and is signed in the bedside chart  Anesthesia Topical only with 1% lidocaine   Timeout Verified patient identification, verified procedure, site/side was marked, verified correct patient position, special equipment/implants available, medications/allergies/relevant history reviewed, required imaging and test results available.  Sterile Technique Maximal sterile technique including full sterile barrier drape, hand hygiene, sterile gown, sterile gloves, mask, hair covering, sterile ultrasound probe cover (if used).  Procedure Description Area of catheter insertion was cleaned with chlorhexidine and draped in sterile fashion.  With real-time ultrasound guidance a central venous catheter was placed into the left subclavian vein. Nonpulsatile blood flow and easy flushing noted in all ports.  The catheter was sutured in place and sterile dressing applied.  Complications/Tolerance None; patient tolerated the procedure well. Chest X-ray is ordered to verify placement for internal jugular or subclavian cannulation.   Chest x-ray is not ordered for femoral cannulation.  EBL Minimal  Specimen(s) None

## 2022-04-30 NOTE — Consult Note (Signed)
NAME:  Yolanda [name], MRN:  [id number], DOB:  Sep 12, 1955, LOS: 2 ADMISSION DATE:  04/28/2022, CONSULTATION DATE:  04/30/22 REFERRING MD:  Cathlean Sauer, CHIEF COMPLAINT:  Worsening respiratory status   History of Present Illness:  Pertinent past medical history of Yolanda [name] cirrhosis, HFpEF, obesity, PE not on AC, A-fib not on AC, chronic respiratory failure on 3L [place] O2,  OSA, hypertension, anxiety, depression, GERD, hypothyroidism.  She was recently admitted at [facility] 10/11-10/19 with AMS and acute cystitis.   Work-up was negative for PE, found to have new onset Afib, multi-focal PNA and effusion and was diuresed and discharged home on 10/19.   Received Flagyl, Cefepime and Doxycycline inpatient and discharged Augmentin and Doxycyline.    She was home one day before returning to the ER 10/20 with worsening shortness  of breath and hypoxia.   She was in significant respiratory distress in the ED and was placed on Bipap.  CXR again showed multi-focal bilateral infiltrates.  She was admitted and started back on Cefepime.  On 10/22 her respiratory status began to decline despite bipap and PCCM was consulted for transfer to the ICU  Pertinent  Medical History   has a past medical history of Abnormal uterine bleeding, Anxiety, Arthritis, Chronic abdominal pain, Chronic pain in left foot, Depression, Elevated liver function tests (2018), Endometriosis, Fibromyalgia, GERD (12/21/2009), Hypertension, Hypothyroidism, IBS (irritable bowel syndrome), Internal hemorrhoids, Kidney stone, Liver cirrhosis secondary to NASH ([place]), Obesity, morbid ([place]), PONV (postoperative nausea and vomiting), Psoriatic arthritis ([place]), Pulmonary embolism ([place]) (12/2005), Rheumatoid arthritis ([place]), Sleep apnea, and Vitamin D deficiency.   Significant Hospital Events: Including procedures, antibiotic start and stop dates in addition to other pertinent events   10/19 discharged home after admission for PNA 10/20 Admitted with continued PNA  and hypoxic respiratory failure 10/22 Worsening hypoxic respiratory failure despite Bipap, ICU transfer, intubated  Interim History / Subjective:  Intubated emergently, BAL done NGT placed and gastric contents suctioned from ETT   Objective   Blood pressure 136/61, pulse 70, temperature 98 F (36.7 C), temperature source Axillary, resp. rate (!) 26, weight 116 kg, last menstrual period 07/11/2007, SpO2 94 %.    FiO2 (%):  [50 %-90 %] 50 %   Intake/Output Summary (Last 24 hours) at 04/30/2022 1453 Last data filed at 04/30/2022 1340 Gross per 24 hour  Intake 486 ml  Output 1500 ml  Net -1014 ml   Filed Weights   04/28/22 2248 04/29/22 0437 04/30/22 0400  Weight: 117.1 kg 117.1 kg 116 kg    General:  critically ill, overweight F, intubated and sedated  HEENT: MM pink/moist, ETT in place, sclera anicteric, pupils equal Neuro: examined after RSI medications, RASS -5 CV: s1s2 Tachycardic, irregular, no m/r/g PULM:  scattered rhonchi bilaterally, mechanically ventilated with equal chest rise GI: obese, large pannus with ecchymosis, soft Extremities: warm/dry, no edema  Skin: no rashes or lesions   Resolved Hospital Problem list     Assessment & Plan:    Acute on Chronic Hypoxic Respiratory Failure Likely secondary to pulmonary edema and bilateral pneumonia Concern for aspiration Intubated and transferred to ICU Bronchoscopy done -Change abx to Zosyn  -follow BAL results -start lasix gtt --Maintain full vent support with SAT/SBT as tolerated -titrate Vent setting to maintain SpO2 greater than or equal to 90%. -HOB elevated 30 degrees. -Plateau pressures less than 30 cm H20.  -Follow chest x-ray, ABG prn.   -Bronchial hygiene and RT/bronchodilator protocol. -prn duonebs and dulera     Atrial Fibrillation  with RVR  -continue lopressor  -continue Asa -holding anti-coagulation in the setting of liver failure and high risk of bleeding  NASH liver cirrhosis   -continue rifaximine and lactulose   Type 2 DM -hold Metformin -SSI   Hypothyroidism -continue Levothyroxine    Best Practice (right click and "Reselect all SmartList Selections" daily)   Diet/type: NPO DVT prophylaxis: SCD GI prophylaxis: PPI Lines: N/A Foley:  Yes, and it is still needed Code Status:  full code Last date of multidisciplinary goals of care discussion [pending]  Labs   CBC: Recent Labs  Lab 04/24/22 0400 04/25/22 0330 04/26/22 0350 04/27/22 0321 04/28/22 1429 04/28/22 1657  WBC 7.6 14.9* 12.3* 8.4 11.8*  --   NEUTROABS  --   --   --   --  8.7*  --   HGB 9.7* 9.6* 10.0* 9.1* 9.8* 9.9*  HCT 29.9* 29.4* 31.2* 28.3* 30.4* 29.0*  MCV 97.4 97.0 99.0 99.3 101.0*  --   PLT 109* 144* 205 176 222  --     Basic Metabolic Panel: Recent Labs  Lab 04/24/22 0400 04/25/22 0330 04/25/22 1703 04/26/22 0350 04/26/22 0401 04/27/22 0321 04/28/22 1429 04/28/22 1657 04/29/22 0044 04/30/22 0036  NA 132* 137 135  --  136 135 132* 139 133* 136  K 3.6 3.1* 3.5  --  4.3 3.5 4.0 4.1 4.0 3.6  CL 102 103 104  --  110 108 104  --  105 104  CO2 _0 --  _1 --  22 24  GLUCOSE 162* 124* 94  --  92 115* 98  --  136* 143*  BUN 11 17 24*  --  25* 22 16  --  18 34*  CREATININE 0.57 0.60 0.69  --  0.65 0.64 0.72  --  0.76 0.87  CALCIUM 8.7* 9.5 9.5  --  9.4 9.1 9.0  --  9.2 9.5  MG 1.8 2.0 1.8 2.1  --   --   --   --   --  1.7  PHOS 2.8 3.3 3.3  --  3.1  --   --   --   --   --    GFR: Estimated Creatinine Clearance: 78.5 mL/min (by C-G formula based on SCr of 0.87 mg/dL). Recent Labs  Lab 04/23/22 1811 04/24/22 0400 04/24/22 0400 04/25/22 0330 04/26/22 0350 04/27/22 0321 04/28/22 1424 04/28/22 1429 04/28/22 1853  PROCALCITON 0.34 0.35  --  0.31  --   --   --   --   --   WBC  --  7.6   < > 14.9* 12.3* 8.4  --  11.8*  --   LATICACIDVEN  --   --   --   --   --   --  1.8  --  1.5   < > = values in this interval not displayed.    Liver Function  Tests: Recent Labs  Lab 04/24/22 0400 04/25/22 0330 04/25/22 1703 04/26/22 0401 04/27/22 0321 04/28/22 1429  AST 39 31  --   --  28 42*  ALT 21 19  --   --  16 18  ALKPHOS 78 69  --   --  57 61  BILITOT 1.7* 1.3*  --   --  1.4* 1.4*  PROT 9.7* 9.8*  --   --  8.5* 9.2*  ALBUMIN 2.5* 3.0* 2.7* 2.5* 2.2* 2.3*   No results for input(s): "LIPASE", "AMYLASE" in the last 168 hours. Recent Labs  Lab 04/25/22 0330 04/26/22 0401 04/28/22 1423  AMMONIA 47* 40* 25    ABG    Component Value Date/Time   PHART 7.35 04/30/2022 1412   PCO2ART 52 (H) 04/30/2022 1412   PO2ART 76 (L) 04/30/2022 1412   HCO3 28.7 (H) 04/30/2022 1412   TCO2 24 04/28/2022 1657   ACIDBASEDEF 2.0 04/28/2022 1657   O2SAT 96.3 04/30/2022 1412     Coagulation Profile: Recent Labs  Lab 04/24/22 0400 04/25/22 0330 04/26/22 0350 04/27/22 0321 04/28/22 1853  INR 1.5* 1.6* 1.7* 1.8* 1.7*    Cardiac Enzymes: No results for input(s): "CKTOTAL", "CKMB", "CKMBINDEX", "TROPONINI" in the last 168 hours.  HbA1C: Hgb A1c MFr Bld  Date/Time Value Ref Range Status  12/07/2021 04:58 AM 5.4 4.8 - 5.6 % Final    Comment:    (NOTE) Pre diabetes:          5.7%-6.4%  Diabetes:              >6.4%  Glycemic control for   <7.0% adults with diabetes   09/21/2021 04:47 AM 5.7 (H) 4.8 - 5.6 % Final    Comment:    (NOTE)         Prediabetes: 5.7 - 6.4         Diabetes: >6.4         Glycemic control for adults with diabetes: <7.0     CBG: Recent Labs  Lab 04/29/22 0726 04/29/22 1155 04/29/22 2203 04/30/22 0619 04/30/22 1130  GLUCAP 141* 136* 145* 132* 131*    Review of Systems:   Unable to obtain  Past Medical History:  She,  has a past medical history of Abnormal uterine bleeding, Anxiety, Arthritis, Chronic abdominal pain, Chronic pain in left foot, Depression, Elevated liver function tests (2018), Endometriosis, Fibromyalgia, GERD (12/21/2009), Hypertension, Hypothyroidism, IBS (irritable bowel  syndrome), Internal hemorrhoids, Kidney stone, Liver cirrhosis secondary to NASH ([place]), Obesity, morbid ([place]), PONV (postoperative nausea and vomiting), Psoriatic arthritis ([place]), Pulmonary embolism ([place]) (12/2005), Rheumatoid arthritis ([place]), Sleep apnea, and Vitamin D deficiency.   Surgical History:   Past Surgical History:  Procedure Laterality Date   ABDOMINAL SURGERY     laparoscopy   BIOPSY N/A 03/24/2014   Procedure: GASTRIC BIOPSIES;  Surgeon: Danie Binder, MD;  Location: [facility];  Service: Endoscopy;  Laterality: N/A;   BIOPSY  10/28/2019   Procedure: BIOPSY;  Surgeon: Danie Binder, MD;  Location: [facility];  Service: Endoscopy;;  gastric nodule   BREAST LUMPECTOMY Right    CESAREAN SECTION     X2   COLONOSCOPY  2006   internal hemorrhoids   COLONOSCOPY WITH PROPOFOL N/A 03/24/2014   Dr. [name]: 2 tubular adenomas removed, hemorrhoids   COLONOSCOPY WITH PROPOFOL N/A 10/28/2019   Fields: External and internal hemorrhoids, 8 polyps ranging from 2 to 5 mm in size removed from the colon.  Multiple tubular adenomas.  Next colonoscopy in 3 years.   CYSTOSCOPY W/ RETROGRADES  01/23/2012   Procedure: CYSTOSCOPY WITH RETROGRADE PYELOGRAM;  Surgeon: Marissa Nestle, MD;  Location: [facility];  Service: Urology;  Laterality: Left;   DILATATION & CURETTAGE/HYSTEROSCOPY WITH MYOSURE N/A 11/18/2014   Procedure: DILATATION & CURETTAGE/HYSTEROSCOPY WITH MYOSURE, resection of polyp;  Surgeon: Cheri Fowler, MD;  Location: [place] [facility];  Service: Gynecology;  Laterality: N/A;   DILATION AND CURETTAGE OF UTERUS     ESOPHAGOGASTRODUODENOSCOPY   08/24/2006   Dr. Veto Kemps erythema of the antrum without erosion or ulcers/Otherwise, normal esophagus without evidence of  Barrett's path with chronic gastritis   ESOPHAGOGASTRODUODENOSCOPY (EGD) WITH PROPOFOL N/A 03/24/2014   Dr. [name]: gastritis   ESOPHAGOGASTRODUODENOSCOPY (EGD) WITH PROPOFOL N/A 10/28/2019   Fields: Esophagus appeared normal, empiric  dilation due to history of dysphagia.  6 mm nodule seen in the gastric cardia.  Mild portal hypertensive gastropathy.  Nodule from the stomach biopsied and showed mild chronic gastritis, no H. pylori.   POLYPECTOMY N/A 03/24/2014   Procedure: POLYPECTOMY;  Surgeon: Danie Binder, MD;  Location: [facility];  Service: Endoscopy;  Laterality: N/A;   POLYPECTOMY  10/28/2019   Procedure: POLYPECTOMY;  Surgeon: Danie Binder, MD;  Location[facility];  Service: Endoscopy;;  hepatic flexure, ascending colon,sigmoid colon, rectal   REMOVAL OF STONES  01/23/2012   Procedure: REMOVAL OF STONES;  Surgeon: Marissa Nestle, MD;  Location: [facility];  Service: Urology;  Laterality: N/A;   SAVORY DILATION N/A 10/28/2019   Procedure: SAVORY DILATION;  Surgeon: Danie Binder, MD;  Location: [facility];  Service: Endoscopy;  Laterality: N/A;   TUBAL LIGATION       Social History:   reports that she quit smoking about 23 years ago. Her smoking use included cigarettes. She has never used smokeless tobacco. She reports that she does not currently use alcohol. She reports that she does not use drugs.   Family History:  Her family history includes Cancer in her mother and paternal grandmother; Depression in her brother; Diabetes in her brother, brother, maternal grandfather, maternal grandmother, and mother; Heart attack in her brother, father, and mother; Hyperlipidemia in her brother and brother; Hypertension in her father and mother; Mesothelioma in her father; Stroke in her mother; Wilson's disease in an other family member. There is no history of Colon cancer.   Allergies Allergies  Allergen Reactions   Azithromycin Hives   Lisinopril Cough   Morphine Hives   Sulfa Antibiotics Other (See Comments)    Yeast infection     Home Medications  Prior to Admission medications   Medication Sig Start Date End Date Taking? Authorizing Provider  acetaminophen (TYLENOL) 325 MG tablet Take 2 tablets (650 mg total)  by mouth every 6 (six) hours as needed for mild pain, headache or fever (or Fever >/= 101). 04/27/22  Yes Emokpae, Courage, MD  albuterol (VENTOLIN HFA) 108 (90 Base) MCG/ACT inhaler Inhale 1-2 puffs into the lungs every 6 (six) hours as needed for wheezing or shortness of breath. 11/08/19  Yes Long, Wonda Olds, MD  amoxicillin-clavulanate (AUGMENTIN) 875-125 MG tablet Take 1 tablet by mouth 2 (two) times daily for 5 days. 04/27/22 05/02/22 Yes Emokpae, Courage, MD  aspirin EC 81 MG tablet Take 81 mg by mouth daily. Swallow whole.   Yes [provider]  budesonide-formoterol (SYMBICORT) 80-4.5 MCG/ACT inhaler Take 2 puffs first thing in am and then another 2 puffs about 12 hours later. Patient taking differently: Inhale 2 puffs into the lungs 2 (two) times daily. Take 2 puffs first thing in am and then another 2 puffs about 12 hours later. 11/25/21  Yes Tanda Rockers, MD  Calcium Carb-Cholecalciferol (CALCIUM 600 + D PO) Take 1 tablet by mouth daily.   Yes [provider]  Cholecalciferol (VITAMIN D3) 2000 units TABS Take 2,000 Units by mouth daily.    Yes [provider]  clotrimazole (MYCELEX) 10 MG troche Take 10 mg by mouth 5 (five) times daily.   Yes [provider]  diltiazem (CARDIZEM CD) 120 MG 24 hr capsule  Take 1 capsule (120 mg total) by mouth daily. 04/27/22 04/27/23 Yes Emokpae, Courage, MD  doxycycline (VIBRA-TABS) 100 MG tablet Take 1 tablet (100 mg total) by mouth 2 (two) times daily for 5 days. 04/27/22 05/02/22 Yes Emokpae, Courage, MD  DULoxetine (CYMBALTA) 60 MG capsule Take 60 mg by mouth daily. 07/15/21  Yes [provider]  furosemide (LASIX) 40 MG tablet Take 1 tablet (40 mg total) by mouth daily. 04/27/22 10/24/22 Yes Emokpae, Courage, MD  inFLIXimab (REMICADE IV) Inject 1 Syringe into the vein every 3 (three) months.   Yes [provider]  lactulose, encephalopathy, (CHRONULAC) 10 GM/15ML SOLN Take 30 mLs (20 g total) by mouth 2  (two) times daily. Goal is 2 to 3 soft/mushy BMs, Ok to take additional dose if No BM in 24 hrs 04/27/22  Yes Emokpae, Courage, MD  levothyroxine (SYNTHROID) 75 MCG tablet Take 75 mcg by mouth daily. 12/26/21  Yes [provider]  meclizine (ANTIVERT) 25 MG tablet Take 25 mg by mouth 3 (three) times daily as needed for dizziness.   Yes [provider]  Melatonin 10 MG CAPS Take 10 mg by mouth at bedtime as needed (sleep).   Yes [provider]  metFORMIN (GLUCOPHAGE-XR) 500 MG 24 hr tablet Take 1 tablet (500 mg total) by mouth 2 (two) times daily with a meal. 04/27/22  Yes Emokpae, Courage, MD  metoprolol tartrate (LOPRESSOR) 25 MG tablet Take 1 tablet (25 mg total) by mouth 2 (two) times daily. 04/27/22  Yes Emokpae, Courage, MD  midodrine (PROAMATINE) 5 MG tablet Take 1 tablet (5 mg total) by mouth 2 (two) times daily with a meal. 04/27/22  Yes Emokpae, Courage, MD  pantoprazole (PROTONIX) 40 MG tablet Take 40 mg by mouth 2 (two) times daily.   Yes [provider]  pilocarpine (SALAGEN) 5 MG tablet Take 5 mg by mouth 2 (two) times daily.   Yes [provider]  potassium chloride (KLOR-CON) 10 MEQ tablet Take 1 tablet (10 mEq total) by mouth daily. Take While taking Lasix/furosemide 04/27/22  Yes Emokpae, Courage, MD  rifaximin (XIFAXAN) 550 MG TABS tablet Take 1 tablet (550 mg total) by mouth 2 (two) times daily. 04/27/22  Yes Emokpae, Courage, MD  rosuvastatin (CRESTOR) 10 MG tablet Take 10 mg by mouth daily.   Yes [provider]  spironolactone (ALDACTONE) 50 MG tablet Take 1 tablet (50 mg total) by mouth daily. 04/27/22 04/27/23 Yes Emokpae, Courage, MD  sucralfate (CARAFATE) 1 g tablet Take 1 g by mouth 4 (four) times daily. 01/25/22  Yes [provider]  blood glucose meter kit and supplies Dispense based on patient and insurance preference. Use up to four times daily as directed. (FOR ICD-10 E10.9, E11.9). 09/22/21   Murlean Iba, MD     Critical care time:  40 minutes    CRITICAL CARE Performed by: Otilio Carpen Taniaya Rudder   Total critical care time: 40 minutes  Critical care time was exclusive of separately billable procedures and treating other patients.  Critical care was necessary to treat or prevent imminent or life-threatening deterioration.  Critical care was time spent personally by me on the following activities: development of treatment plan with patient and/or surrogate as well as nursing, discussions with consultants, evaluation of patient's response to treatment, examination of patient, obtaining history from patient or surrogate, ordering and performing treatments and interventions, ordering and review of laboratory studies, ordering and review of radiographic studies, pulse oximetry and re-evaluation of patient's condition.  Otilio Carpen Leotha Westermeyer, PA-C [facility] See Amion for pager If no response to pager , please call [phone] until 7pm After 7:00 pm call Elink  [phone]

## 2022-04-30 NOTE — Progress Notes (Signed)
Patient O2 sat dropping even with using BiPAP, MD aware, patient transferred to [place], report given to [facility].

## 2022-04-30 NOTE — Progress Notes (Signed)
Progress Note   Patient: Yolanda [name] MVE:[id number] DOB: 06/25/1956 DOA: 04/28/2022     2 DOS: the patient was seen and examined on 04/30/2022   Brief hospital course: Yolanda [name] was admitted to the hospital with the working diagnosis of decompensated heart failure.   66 yo female with the past medical history of NASH/ cirrhosis, paroxysmal atrial fibrillation, heart failure, COPD and obesity who presented with dyspnea. She was discharged from the hospital ([facility]) one day prior, for heart failure and pneumonia and urinary tract infection, she was in the hospital for 8 days. Patient at home was noted to have worsening oxygen desaturation down to the 60's and increased work of breathing that prompted her transfer to the ED. At the time she reached the ED she was in respiratory distress and was placed on non invasive mechanical ventilation. Her blood pressure was 118/52, HR 82, RR 31 and 02 saturation 96%, lungs with decreased breath sounds, diffuse wheezing and rales more right than left, increased work of breathing, heart with S1 and S2 present and rhythmic, abdomen with no distention, no lower extremity edema, patient was lethargic  but non focal.   ABG 7.34/ 42/ 73/ 24/ 93% Na 132, K 4,0 CL 104, bicarbonate 22, glucose 98, bun 16, cr 0.72  AST 42 ALT 18 BNP 103 Wbc 11.8 hgb 9.8 plt 222  Chest radiograph with cardiomegaly, bilateral hilar vascular congestion, bilateral interstitial infiltrates with more dense at the right lower lobe.   EKG 86 bpm, normal axis, normal qtc, atrial fibrillation rhythm, no significant ST segment or T wave changes.   Patient continue with high oxygen requirements and using Bipap intermittently.   Assessment and Plan: * Acute on chronic diastolic CHF (congestive heart failure) (HCC) Echocardiogram with preserved LV systolic function EF 70 to 28%, RV systolic function preserved, no significant valvular disease.   CT chest with bilateral ground glass  opacities, predominant central location. Small bilateral pleural effusions, more right than left. New small pericardial effusion.   Urine output is 3,662 ml  Systolic blood pressure 947 to 130 mmHg.   Plan to continue with aggressive diuresis with furosemide and spironolactone Hold on SGLT 2 inh due to fungal infections.   Acute hypoxemic respiratory failure due to cardiogenic pulmonary edema.  Patient has been treated with antibiotic therapy for 8 days, her procalcitonin is 0,31 on 04/25/22.  She has been afebrile and no significant leukocytosis. Holding on antibiotic therapy for now with close monitoring.  For now ruled out recurrent pneumonia.   Follow up chest radiograph today.  Continue intermittent Bipap to support work of breathing.   Essential hypertension Continue with midodrine for blood pressure support.  Diuresis with furosemide.   Paroxysmal atrial fibrillation with RVR ([place]) Patient is on metoprolol for rate control but not on anticoagulation.  Prior cardiology consultation has recommended to hold on anticoagulation in the setting of liver cirrhosis and high risk of bleeding.    Hyponatremia Stable renal function with serum cr at 0,87, K is 3,6 and serum bicarbonate at 24. Add 40 meq Kcl x1 and follow up renal function in am. Continue diuresis with IV furosemide and po spironolactone.   Liver cirrhosis secondary to NASH [facility]) Patient with no clinical signs of ascites, no signs of decompensated liver failure.  Elevated liver enzymes due to congested hepatopathy.   Continue with rifaximin and lactulose.   Acute metabolic encephalopathy Patient somnolent at the time of my examination Last night not able to sleep well Plan  to continue close monitoring, will discontinue alprazolam  Low threshold to check ABG if worsening  GERD (gastroesophageal reflux disease) Continue antiacid therapy.   Hypothyroidism Continue with levothyroxine   Type 2 diabetes mellitus  with hyperlipidemia (HCC) Continue insulin sliding scale for glucose cover and monitoring Fasting glucose this am 143 mg/dl Continue with metformin   Continue with statin therapy.   Class 3 obesity (HCC) Calculated BMI is 44.3 Patient with very poor physical functional status, uses a walker for ambulation.   Depression and anxiety,  Continue with daily duloxetine.  Discontinue alprazolam.         Subjective: Patient not able to sleep well last night, this am back on bipap, she is somnolent at the time of my examination   Physical Exam: Vitals:   04/30/22 0104 04/30/22 0400 04/30/22 0849 04/30/22 1200  BP:  (!) 105/49 133/65 134/71  Pulse: 67 74 67 68  Resp:  (!) 22 (!) 33 (!) 27  Temp:  97.7 F (36.5 C)    TempSrc:  Oral    SpO2:  94% 93% 92%  Weight:  116 kg     Neurology somnolent but easy to arouse ENT with full face mask Bipap in place Cardiovascular with S1 and S2 present and rhythmic with no gallops Respiratory with rales and rhonchi, positive wheezing Abdomen with no distention  Trace lower extremity edema  Data Reviewed:    Family Communication: I spoke with patient's daughter at the bedside, we talked in detail about patient's condition, plan of care and prognosis and all questions were addressed.  She confirmed patient wants to be full code.   Disposition: Status is: Inpatient Remains inpatient appropriate because: respiratory failure   Planned Discharge Destination: Home  Author: Tawni Millers, MD 04/30/2022 12:19 PM  For on call review [email or web address].

## 2022-04-30 NOTE — Procedures (Signed)
Intubation Procedure Note  Yolanda [name]  [id number]  1956/06/14  Date:04/30/22  Time:4:25 PM   Provider Performing:Richad Ramsay    Procedure: Intubation (31500)  Indication(s) Respiratory Failure  Consent Risks of the procedure as well as the alternatives and risks of each were explained to the patient and/or caregiver.  Consent for the procedure was obtained and is signed in the bedside chart   Anesthesia Etomidate and Rocuronium   Time Out Verified patient identification, verified procedure, site/side was marked, verified correct patient position, special equipment/implants available, medications/allergies/relevant history reviewed, required imaging and test results available.   Sterile Technique Usual hand hygeine, masks, and gloves were used   Procedure Description Patient positioned in bed supine.  Sedation given as noted above.  Patient was intubated with endotracheal tube using  MAC 4 .  View was Grade 1 full glottis .  Number of attempts was 1.  Colorimetric CO2 detector was consistent with tracheal placement.   Complications/Tolerance None; patient tolerated the procedure well. Chest X-ray is ordered to verify placement.   EBL Minimal   Specimen(s) None

## 2022-04-30 NOTE — Plan of Care (Signed)
  Problem: Education: Goal: Ability to describe self-care measures that may prevent or decrease complications (Diabetes Survival Skills Education) will improve Outcome: Progressing Goal: Individualized Educational Video(s) Outcome: Progressing   Problem: Coping: Goal: Ability to adjust to condition or change in health will improve Outcome: Progressing   Problem: Fluid Volume: Goal: Ability to maintain a balanced intake and output will improve Outcome: Progressing   Problem: Health Behavior/Discharge Planning: Goal: Ability to identify and utilize available resources and services will improve Outcome: Progressing Goal: Ability to manage health-related needs will improve Outcome: Progressing   Problem: Metabolic: Goal: Ability to maintain appropriate glucose levels will improve Outcome: Progressing   Problem: Nutritional: Goal: Maintenance of adequate nutrition will improve Outcome: Progressing Goal: Progress toward achieving an optimal weight will improve Outcome: Progressing   Problem: Skin Integrity: Goal: Risk for impaired skin integrity will decrease Outcome: Progressing   Problem: Tissue Perfusion: Goal: Adequacy of tissue perfusion will improve Outcome: Progressing   Problem: Education: Goal: Knowledge of General Education information will improve Description: Including pain rating scale, medication(s)/side effects and non-pharmacologic comfort measures Outcome: Progressing   Problem: Health Behavior/Discharge Planning: Goal: Ability to manage health-related needs will improve Outcome: Progressing   Problem: Clinical Measurements: Goal: Ability to maintain clinical measurements within normal limits will improve Outcome: Progressing Goal: Will remain free from infection Outcome: Progressing Goal: Diagnostic test results will improve Outcome: Progressing Goal: Respiratory complications will improve Outcome: Progressing Goal: Cardiovascular complication will  be avoided Outcome: Progressing   Problem: Activity: Goal: Risk for activity intolerance will decrease Outcome: Progressing   Problem: Nutrition: Goal: Adequate nutrition will be maintained Outcome: Progressing   Problem: Coping: Goal: Level of anxiety will decrease Outcome: Progressing   Problem: Elimination: Goal: Will not experience complications related to bowel motility Outcome: Progressing Goal: Will not experience complications related to urinary retention Outcome: Progressing   Problem: Pain Managment: Goal: General experience of comfort will improve Outcome: Progressing   Problem: Safety: Goal: Ability to remain free from injury will improve Outcome: Progressing   Problem: Skin Integrity: Goal: Risk for impaired skin integrity will decrease Outcome: Progressing   Problem: Education: Goal: Ability to demonstrate management of disease process will improve Outcome: Progressing Goal: Individualized Educational Video(s) Outcome: Progressing   Problem: Activity: Goal: Capacity to carry out activities will improve Outcome: Progressing   Problem: Cardiac: Goal: Ability to achieve and maintain adequate cardiopulmonary perfusion will improve Outcome: Progressing   Problem: Safety: Goal: Non-violent Restraint(s) Outcome: Progressing

## 2022-04-30 NOTE — Progress Notes (Signed)
Pharmacy Antibiotic Note  Yolanda [name] is a 66 y.o. female admitted on 04/28/2022 with pneumonia.  Pharmacy has been consulted for vancomycin and cefepime dosing. Patient is afebrile, on Bi-PAP and in respiratory distress. Previously on antibiotics from 10/11 to 10/19 for enterobacter aerogenes UTI and pneumonia at previous admission at [facility]. Scr 0.87 is trending up gradually, baseline ~0.6.   Plan: D/c vancomycin and cefepime per Dr. Tacy Learn. Begin Zosyn 3.375g IV q 8 hrs.  Weight: 116 kg (255 lb 11.7 oz)  Temp (24hrs), Avg:98.1 F (36.7 C), Min:97.7 F (36.5 C), Max:98.5 F (36.9 C)  Recent Labs  Lab 04/24/22 0400 04/25/22 0330 04/25/22 1703 04/26/22 0350 04/26/22 0401 04/27/22 0321 04/28/22 1424 04/28/22 1429 04/28/22 1853 04/29/22 0044 04/30/22 0036  WBC 7.6 14.9*  --  12.3*  --  8.4  --  11.8*  --   --   --   CREATININE 0.57 0.60   < >  --  0.65 0.64  --  0.72  --  0.76 0.87  LATICACIDVEN  --   --   --   --   --   --  1.8  --  1.5  --   --    < > = values in this interval not displayed.     Estimated Creatinine Clearance: 78.5 mL/min (by C-G formula based on SCr of 0.87 mg/dL).    Allergies  Allergen Reactions   Azithromycin Hives   Lisinopril Cough   Morphine Hives   Sulfa Antibiotics Other (See Comments)    Yeast infection    Antimicrobials this admission: CTX 10/11 >> 10/15 Doxycycline 10/11, 10/15>> 10/20 Fluconazole x1 10/15 MTZ 10/15>> 10/17 Vancomycin x1 10/15, 10/22 >>  Cefepime 10/20 >> 10/2, 10/22 >>   Microbiology results: 10/11 BCx: ngtd 10/11 UCx: >199k col E Aerogenes   10/15 MRSA PCR: neg 10/22 MRSA PCR: ordered   Thank you for allowing pharmacy to be a part of this patient's care.  [place] Crane, Roylene Reason, BCCP Clinical Pharmacist  04/30/2022 3:59 PM   [facility] pharmacy phone numbers are listed on [email or web address]

## 2022-04-30 NOTE — Procedures (Signed)
Bronchoscopy Procedure Note  Yolanda [name]  [id number]  10/29/1955  Date:04/30/22  Time:4:27 PM   Provider Performing:Doy Taaffe   Procedure(s):  Flexible bronchoscopy with bronchial alveolar lavage (45997)  Indication(s) Acute respiratory failure with aspiration PNA  Consent Risks of the procedure as well as the alternatives and risks of each were explained to the patient and/or caregiver.  Consent for the procedure was obtained and is signed in the bedside chart  Anesthesia Etomidate and Rocuronium   Time Out Verified patient identification, verified procedure, site/side was marked, verified correct patient position, special equipment/implants available, medications/allergies/relevant history reviewed, required imaging and test results available.   Sterile Technique Usual hand hygiene, masks, gowns, and gloves were used   Procedure Description Bronchoscope advanced through endotracheal tube and into airway.  Airways were examined down to subsegmental level with findings noted below.   Following diagnostic evaluation, BAL(s) performed in RLL with normal saline and return of Yellowish colored  fluid  Findings: Copious amount of thin secretions noted throughout the respiratory tree, suction and BAL was performed in right lower lobe, sent for culture   Complications/Tolerance None; patient tolerated the procedure well. Chest X-ray is needed post procedure.   EBL Minimal   Specimen(s) BAL

## 2022-04-30 NOTE — Significant Event (Signed)
Pt family called a nurse to room. Pt is respiratory distress. RR-27 02 sats dropping to 80s and 70s. Patient mouth checked for food and cleaned place back on Bipap.

## 2022-04-30 NOTE — Significant Event (Signed)
Rapid Response Event Note   Reason for Call :  Desaturation to 60s% after cleaning patient  Initial Focused Assessment:  Patient lying in bed with BiPAP mask on, eyes closed. Opens eyes briefly to voice, able to follow simple commands. Bilateral lungs rhonchi. Mild edema noted in all extremities.   VS: 136/61 (82), HR 76, RR 35, O2 94% on BiPAP 100%, temp 98 axillary  Interventions:  M. Arrien notified CCM consult Transfer to [place] for intubation/bronchoscopy  Event Summary:  MD Notified: M. Arrien MD, Jeanella Craze MD Call Time: [place] Time: 4696 End Time: [place]  Newman Nickels, RN

## 2022-04-30 NOTE — Progress Notes (Signed)
Per patient request, bipap removed and 6L HFNC placed.

## 2022-05-01 ENCOUNTER — Encounter: Payer: Medicare Other | Admitting: Pulmonary Disease

## 2022-05-01 DIAGNOSIS — I5033 Acute on chronic diastolic (congestive) heart failure: Secondary | ICD-10-CM | POA: Diagnosis not present

## 2022-05-01 LAB — CBC
HCT: 29.7 % — ABNORMAL LOW (ref 36.0–46.0)
Hemoglobin: 9.3 g/dL — ABNORMAL LOW (ref 12.0–15.0)
MCH: 32 pg (ref 26.0–34.0)
MCHC: 31.3 g/dL (ref 30.0–36.0)
MCV: 102.1 fL — ABNORMAL HIGH (ref 80.0–100.0)
Platelets: 156 10*3/uL (ref 150–400)
RBC: 2.91 MIL/uL — ABNORMAL LOW (ref 3.87–5.11)
RDW: 16.9 % — ABNORMAL HIGH (ref 11.5–15.5)
WBC: 11.6 10*3/uL — ABNORMAL HIGH (ref 4.0–10.5)
nRBC: 0 % (ref 0.0–0.2)

## 2022-05-01 LAB — GLUCOSE, CAPILLARY
Glucose-Capillary: 112 mg/dL — ABNORMAL HIGH (ref 70–99)
Glucose-Capillary: 127 mg/dL — ABNORMAL HIGH (ref 70–99)
Glucose-Capillary: 137 mg/dL — ABNORMAL HIGH (ref 70–99)
Glucose-Capillary: 151 mg/dL — ABNORMAL HIGH (ref 70–99)
Glucose-Capillary: 175 mg/dL — ABNORMAL HIGH (ref 70–99)

## 2022-05-01 LAB — POCT I-STAT 7, (LYTES, BLD GAS, ICA,H+H)
Acid-Base Excess: 4 mmol/L — ABNORMAL HIGH (ref 0.0–2.0)
Bicarbonate: 30.7 mmol/L — ABNORMAL HIGH (ref 20.0–28.0)
Calcium, Ion: 1.3 mmol/L (ref 1.15–1.40)
HCT: 30 % — ABNORMAL LOW (ref 36.0–46.0)
Hemoglobin: 10.2 g/dL — ABNORMAL LOW (ref 12.0–15.0)
O2 Saturation: 96 %
Patient temperature: 98.1
Potassium: 3.7 mmol/L (ref 3.5–5.1)
Sodium: 146 mmol/L — ABNORMAL HIGH (ref 135–145)
TCO2: 32 mmol/L (ref 22–32)
pCO2 arterial: 53.7 mmHg — ABNORMAL HIGH (ref 32–48)
pH, Arterial: 7.363 (ref 7.35–7.45)
pO2, Arterial: 85 mmHg (ref 83–108)

## 2022-05-01 LAB — BASIC METABOLIC PANEL
Anion gap: 6 (ref 5–15)
BUN: 47 mg/dL — ABNORMAL HIGH (ref 8–23)
CO2: 28 mmol/L (ref 22–32)
Calcium: 9.3 mg/dL (ref 8.9–10.3)
Chloride: 107 mmol/L (ref 98–111)
Creatinine, Ser: 1.1 mg/dL — ABNORMAL HIGH (ref 0.44–1.00)
GFR, Estimated: 55 mL/min — ABNORMAL LOW (ref >60)
Glucose, Bld: 155 mg/dL — ABNORMAL HIGH (ref 70–99)
Potassium: 4 mmol/L (ref 3.5–5.1)
Sodium: 141 mmol/L (ref 135–145)

## 2022-05-01 LAB — MRSA NEXT GEN BY PCR, NASAL: MRSA by PCR Next Gen: NOT DETECTED

## 2022-05-01 MED ORDER — FENTANYL CITRATE PF 50 MCG/ML IJ SOSY
50.0000 ug | PREFILLED_SYRINGE | INTRAMUSCULAR | Status: DC | PRN
  Administered 2022-05-01: 50 ug via INTRAVENOUS
  Filled 2022-05-01: qty 1

## 2022-05-01 MED ORDER — BUDESONIDE 0.5 MG/2ML IN SUSP
0.5000 mg | Freq: Two times a day (BID) | RESPIRATORY_TRACT | Status: DC
  Administered 2022-05-01 – 2022-05-19 (×35): 0.5 mg via RESPIRATORY_TRACT
  Filled 2022-05-01 (×37): qty 2

## 2022-05-01 MED ORDER — ORAL CARE MOUTH RINSE
15.0000 mL | OROMUCOSAL | Status: DC
  Administered 2022-05-02 – 2022-05-03 (×17): 15 mL via OROMUCOSAL

## 2022-05-01 MED ORDER — MIDODRINE HCL 5 MG PO TABS
5.0000 mg | ORAL_TABLET | Freq: Three times a day (TID) | ORAL | Status: DC
  Administered 2022-05-01 – 2022-05-02 (×3): 5 mg via ORAL
  Filled 2022-05-01 (×3): qty 1

## 2022-05-01 MED ORDER — SPIRONOLACTONE 25 MG PO TABS
50.0000 mg | ORAL_TABLET | Freq: Every day | ORAL | Status: DC
  Administered 2022-05-01 – 2022-05-03 (×3): 50 mg
  Filled 2022-05-01 (×4): qty 2

## 2022-05-01 MED ORDER — ORAL CARE MOUTH RINSE: 15.0000 mL | OROMUCOSAL | Status: DC | PRN

## 2022-05-01 MED ORDER — METHYLPREDNISOLONE SODIUM SUCC 125 MG IJ SOLR
60.0000 mg | INTRAMUSCULAR | Status: DC
  Administered 2022-05-01 – 2022-05-02 (×2): 60 mg via INTRAVENOUS
  Filled 2022-05-01 (×2): qty 2

## 2022-05-01 NOTE — Consult Note (Signed)
NAME:  Yolanda [name], MRN:  [id number], DOB:  03/28/1956, LOS: 3 ADMISSION DATE:  04/28/2022, CONSULTATION DATE:  05/01/22 REFERRING MD:  Cathlean Sauer, CHIEF COMPLAINT:  Worsening respiratory status   History of Present Illness:  Pertinent past medical history of Yolanda [name] cirrhosis, HFpEF, obesity, PE not on AC, A-fib not on AC, chronic respiratory failure on 3L [place] O2,  OSA, hypertension, anxiety, depression, GERD, hypothyroidism.  She was recently admitted at [facility] 10/11-10/19 with AMS and acute cystitis.   Work-up was negative for PE, found to have new onset Afib, multi-focal PNA and effusion and was diuresed and discharged home on 10/19.   Received Flagyl, Cefepime and Doxycycline inpatient and discharged Augmentin and Doxycyline.    She was home one day before returning to the ER 10/20 with worsening shortness  of breath and hypoxia.   She was in significant respiratory distress in the ED and was placed on Bipap.  CXR again showed multi-focal bilateral infiltrates.  She was admitted and started back on Cefepime.  On 10/22 her respiratory status began to decline despite bipap and PCCM was consulted for transfer to the ICU  Pertinent  Medical History   has a past medical history of Abnormal uterine bleeding, Anxiety, Arthritis, Chronic abdominal pain, Chronic pain in left foot, Depression, Elevated liver function tests (2018), Endometriosis, Fibromyalgia, GERD (12/21/2009), Hypertension, Hypothyroidism, IBS (irritable bowel syndrome), Internal hemorrhoids, Kidney stone, Liver cirrhosis secondary to NASH ([place]), Obesity, morbid ([place]), PONV (postoperative nausea and vomiting), Psoriatic arthritis ([place]), Pulmonary embolism ([place]) (12/2005), Rheumatoid arthritis ([place]), Sleep apnea, and Vitamin D deficiency.   Significant Hospital Events: Including procedures, antibiotic start and stop dates in addition to other pertinent events   10/19 discharged home after admission for PNA 10/20 Admitted with continued PNA  and hypoxic respiratory failure 10/22 Worsening hypoxic respiratory failure despite Bipap, ICU transfer, intubated  Interim History / Subjective:  Emergently intubated yesterday Briefly required levophed post intubation, now off.  800 mL negative in the last 24 hours with lasix infusion at 10 Continues to have increased NGT output Vent weaning   Objective   Blood pressure (!) 86/55, pulse 62, temperature (!) 97.1 F (36.2 C), temperature source Axillary, resp. rate (!) 30, weight 112.2 kg, last menstrual period 07/11/2007, SpO2 99 %.    Vent Mode: PRVC FiO2 (%):  [50 %-100 %] 60 % Set Rate:  [24 bmp-30 bmp] 30 bmp Vt Set:  [400 mL-430 mL] 400 mL PEEP:  [10 cmH20] 10 cmH20 Plateau Pressure:  [31 cmH20-38 cmH20] 31 cmH20   Intake/Output Summary (Last 24 hours) at 05/01/2022 0925 Last data filed at 05/01/2022 0543 Gross per 24 hour  Intake 1218.9 ml  Output 1970 ml  Net -751.1 ml    Filed Weights   04/29/22 0437 04/30/22 0400 05/01/22 0500  Weight: 117.1 kg 116 kg 112.2 kg    General:  Elderly appearing female on vent HEENT: [place]/AT, PERRL, no appreciable JVD Neuro: Sedated, will become quite agitated with tactile stimulation.  CV: IRIR, no MRG. Rate controlled. PULM:  scattered rhonchi GI: Soft, NT, hypoactive.  Extremities: Warm dry, no edema.  Skin: Grossly intact   Resolved Hospital Problem list     Assessment & Plan:   Acute on Chronic Hypoxic Respiratory Failure Multifocal PNA: treat as HCAP vs aspiration Acute on chronic HFpEF Intubated and transferred to ICU Bronchoscopy done -Change abx to Zosyn  -follow BAL results -Lasix gtt continue, trend chemistry -Maintain full vent support with SAT/SBT as tolerated -titrate Vent setting  to maintain SpO2 greater than or equal to 90%. -prn duonebs budesonide (home Symbicort)  Atrial Fibrillation with RVR  -Hold lopressor with  borderline BPs -continue Asa -holding anti-coagulation in the setting of liver  failure and high risk of bleeding  NASH liver cirrhosis  -continue rifaximin and lactulose - increase spironolactone to home dose.   - restart midodrine  Type 2 DM -holding metformin -SSI  Hypothyroidism -continue Levothyroxine  GERD - PPI and carafate  Nutrition: - high NGT output. Hold off TF for another day  Best Practice (right click and "Reselect all SmartList Selections" daily)   Diet/type: NPO DVT prophylaxis: SCD GI prophylaxis: PPI Lines: N/A Foley:  Yes, and it is still needed Code Status:  full code Last date of multidisciplinary goals of care discussion [pending]  Labs   CBC: Recent Labs  Lab 04/25/22 0330 04/26/22 0350 04/27/22 0321 04/28/22 1429 04/28/22 1657 04/30/22 1654 04/30/22 2137 05/01/22 0411  WBC 14.9* 12.3* 8.4 11.8*  --   --   --  11.6*  NEUTROABS  --   --   --  8.7*  --   --   --   --   HGB 9.6* 10.0* 9.1* 9.8* 9.9* 11.9* 10.2* 9.3*  HCT 29.4* 31.2* 28.3* 30.4* 29.0* 35.0* 30.0* 29.7*  MCV 97.0 99.0 99.3 101.0*  --   --   --  102.1*  PLT 144* 205 176 222  --   --   --  156     Basic Metabolic Panel: Recent Labs  Lab 04/25/22 0330 04/25/22 1703 04/26/22 0350 04/26/22 0401 04/27/22 0321 04/28/22 1429 04/28/22 1657 04/29/22 0044 04/30/22 0036 04/30/22 1654 04/30/22 2137 05/01/22 0411  NA 137 135  --  136 135 132*   < > 133* 136 143 144 141  K 3.1* 3.5  --  4.3 3.5 4.0   < > 4.0 3.6 4.1 4.0 4.0  CL 103 104  --  110 108 104  --  105 104  --   --  107  CO2 26 27  --  _0 --  22 24  --   --  28  GLUCOSE 124* 94  --  92 115* 98  --  136* 143*  --   --  155*  BUN 17 24*  --  25* 22 16  --  18 34*  --   --  47*  CREATININE 0.60 0.69  --  0.65 0.64 0.72  --  0.76 0.87  --   --  1.10*  CALCIUM 9.5 9.5  --  9.4 9.1 9.0  --  9.2 9.5  --   --  9.3  MG 2.0 1.8 2.1  --   --   --   --   --  1.7  --   --   --   PHOS 3.3 3.3  --  3.1  --   --   --   --   --   --   --   --    < > = values in this interval not displayed.     GFR: Estimated Creatinine Clearance: 60.9 mL/min (A) (by C-G formula based on SCr of 1.1 mg/dL (H)). Recent Labs  Lab 04/25/22 0330 04/26/22 0350 04/27/22 0321 04/28/22 1424 04/28/22 1429 04/28/22 1853 05/01/22 0411  PROCALCITON 0.31  --   --   --   --   --   --   WBC 14.9* 12.3* 8.4  --  11.8*  --  11.6*  LATICACIDVEN  --   --   --  1.8  --  1.5  --      Liver Function Tests: Recent Labs  Lab 04/25/22 0330 04/25/22 1703 04/26/22 0401 04/27/22 0321 04/28/22 1429  AST 31  --   --  28 42*  ALT 19  --   --  16 18  ALKPHOS 69  --   --  57 61  BILITOT 1.3*  --   --  1.4* 1.4*  PROT 9.8*  --   --  8.5* 9.2*  ALBUMIN 3.0* 2.7* 2.5* 2.2* 2.3*    No results for input(s): "LIPASE", "AMYLASE" in the last 168 hours. Recent Labs  Lab 04/25/22 0330 04/26/22 0401 04/28/22 1423  AMMONIA 47* 40* 25     ABG    Component Value Date/Time   PHART 7.307 (L) 04/30/2022 2137   PCO2ART 54.7 (H) 04/30/2022 2137   PO2ART 95 04/30/2022 2137   HCO3 27.4 04/30/2022 2137   TCO2 29 04/30/2022 2137   ACIDBASEDEF 2.0 04/28/2022 1657   O2SAT 96 04/30/2022 2137     Coagulation Profile: Recent Labs  Lab 04/25/22 0330 04/26/22 0350 04/27/22 0321 04/28/22 1853  INR 1.6* 1.7* 1.8* 1.7*     Cardiac Enzymes: No results for input(s): "CKTOTAL", "CKMB", "CKMBINDEX", "TROPONINI" in the last 168 hours.  HbA1C: Hgb A1c MFr Bld  Date/Time Value Ref Range Status  12/07/2021 04:58 AM 5.4 4.8 - 5.6 % Final    Comment:    (NOTE) Pre diabetes:          5.7%-6.4%  Diabetes:              >6.4%  Glycemic control for   <7.0% adults with diabetes   09/21/2021 04:47 AM 5.7 (H) 4.8 - 5.6 % Final    Comment:    (NOTE)         Prediabetes: 5.7 - 6.4         Diabetes: >6.4         Glycemic control for adults with diabetes: <7.0     CBG: Recent Labs  Lab 04/30/22 0619 04/30/22 1130 04/30/22 1830 04/30/22 2137 05/01/22 0638  GLUCAP 132* 131* 139* 141* 137*     Review of  Systems:   Unable to obtain  Past Medical History:  She,  has a past medical history of Abnormal uterine bleeding, Anxiety, Arthritis, Chronic abdominal pain, Chronic pain in left foot, Depression, Elevated liver function tests (2018), Endometriosis, Fibromyalgia, GERD (12/21/2009), Hypertension, Hypothyroidism, IBS (irritable bowel syndrome), Internal hemorrhoids, Kidney stone, Liver cirrhosis secondary to NASH ([place]), Obesity, morbid ([place]), PONV (postoperative nausea and vomiting), Psoriatic arthritis ([place]), Pulmonary embolism ([place]) (12/2005), Rheumatoid arthritis ([place]), Sleep apnea, and Vitamin D deficiency.   Surgical History:   Past Surgical History:  Procedure Laterality Date   ABDOMINAL SURGERY     laparoscopy   BIOPSY N/A 03/24/2014   Procedure: GASTRIC BIOPSIES;  Surgeon: Danie Binder, MD;  Location: [facility];  Service: Endoscopy;  Laterality: N/A;   BIOPSY  10/28/2019   Procedure: BIOPSY;  Surgeon: Danie Binder, MD;  Location: [facility];  Service: Endoscopy;;  gastric nodule   BREAST LUMPECTOMY Right    CESAREAN SECTION     X2   COLONOSCOPY  2006   internal hemorrhoids   COLONOSCOPY WITH PROPOFOL N/A 03/24/2014   Dr. [name]: 2 tubular adenomas removed, hemorrhoids   COLONOSCOPY WITH PROPOFOL N/A 10/28/2019   Fields: External and internal hemorrhoids, 8 polyps  ranging from 2 to 5 mm in size removed from the colon.  Multiple tubular adenomas.  Next colonoscopy in 3 years.   CYSTOSCOPY W/ RETROGRADES  01/23/2012   Procedure: CYSTOSCOPY WITH RETROGRADE PYELOGRAM;  Surgeon: Marissa Nestle, MD;  Location: [facility];  Service: Urology;  Laterality: Left;   DILATATION & CURETTAGE/HYSTEROSCOPY WITH MYOSURE N/A 11/18/2014   Procedure: DILATATION & CURETTAGE/HYSTEROSCOPY WITH MYOSURE, resection of polyp;  Surgeon: Cheri Fowler, MD;  Location: [place] [facility];  Service: Gynecology;  Laterality: N/A;   DILATION AND CURETTAGE OF UTERUS     ESOPHAGOGASTRODUODENOSCOPY   08/24/2006   Dr.  Veto Kemps erythema of the antrum without erosion or ulcers/Otherwise, normal esophagus without evidence of Barrett's path with chronic gastritis   ESOPHAGOGASTRODUODENOSCOPY (EGD) WITH PROPOFOL N/A 03/24/2014   Dr. [name]: gastritis   ESOPHAGOGASTRODUODENOSCOPY (EGD) WITH PROPOFOL N/A 10/28/2019   Fields: Esophagus appeared normal, empiric dilation due to history of dysphagia.  6 mm nodule seen in the gastric cardia.  Mild portal hypertensive gastropathy.  Nodule from the stomach biopsied and showed mild chronic gastritis, no H. pylori.   POLYPECTOMY N/A 03/24/2014   Procedure: POLYPECTOMY;  Surgeon: Danie Binder, MD;  Location: [facility];  Service: Endoscopy;  Laterality: N/A;   POLYPECTOMY  10/28/2019   Procedure: POLYPECTOMY;  Surgeon: Danie Binder, MD;  Location: [facility];  Service: Endoscopy;;  hepatic flexure, ascending colon,sigmoid colon, rectal   REMOVAL OF STONES  01/23/2012   Procedure: REMOVAL OF STONES;  Surgeon: Marissa Nestle, MD;  Location: [facility];  Service: Urology;  Laterality: N/A;   SAVORY DILATION N/A 10/28/2019   Procedure: SAVORY DILATION;  Surgeon: Danie Binder, MD;  Location: [facility];  Service: Endoscopy;  Laterality: N/A;   TUBAL LIGATION       Social History:   reports that she quit smoking about 23 years ago. Her smoking use included cigarettes. She has never used smokeless tobacco. She reports that she does not currently use alcohol. She reports that she does not use drugs.   Family History:  Her family history includes Cancer in her mother and paternal grandmother; Depression in her brother; Diabetes in her brother, brother, maternal grandfather, maternal grandmother, and mother; Heart attack in her brother, father, and mother; Hyperlipidemia in her brother and brother; Hypertension in her father and mother; Mesothelioma in her father; Stroke in her mother; Wilson's disease in an other family member. There is no history of Colon cancer.    Allergies Allergies  Allergen Reactions   Azithromycin Hives   Lisinopril Cough   Morphine Hives   Sulfa Antibiotics Other (See Comments)    Yeast infection     Home Medications  Prior to Admission medications   Medication Sig Start Date End Date Taking? Authorizing Provider  acetaminophen (TYLENOL) 325 MG tablet Take 2 tablets (650 mg total) by mouth every 6 (six) hours as needed for mild pain, headache or fever (or Fever >/= 101). 04/27/22  Yes Emokpae, Courage, MD  albuterol (VENTOLIN HFA) 108 (90 Base) MCG/ACT inhaler Inhale 1-2 puffs into the lungs every 6 (six) hours as needed for wheezing or shortness of breath. 11/08/19  Yes Long, Wonda Olds, MD  amoxicillin-clavulanate (AUGMENTIN) 875-125 MG tablet Take 1 tablet by mouth 2 (two) times daily for 5 days. 04/27/22 05/02/22 Yes Emokpae, Courage, MD  aspirin EC 81 MG tablet Take 81 mg by mouth daily. Swallow whole.   Yes [provider]  budesonide-formoterol (SYMBICORT) 80-4.5 MCG/ACT inhaler Take 2 puffs  first thing in am and then another 2 puffs about 12 hours later. Patient taking differently: Inhale 2 puffs into the lungs 2 (two) times daily. Take 2 puffs first thing in am and then another 2 puffs about 12 hours later. 11/25/21  Yes Tanda Rockers, MD  Calcium Carb-Cholecalciferol (CALCIUM 600 + D PO) Take 1 tablet by mouth daily.   Yes [provider]  Cholecalciferol (VITAMIN D3) 2000 units TABS Take 2,000 Units by mouth daily.    Yes [provider]  clotrimazole (MYCELEX) 10 MG troche Take 10 mg by mouth 5 (five) times daily.   Yes [provider]  diltiazem (CARDIZEM CD) 120 MG 24 hr capsule Take 1 capsule (120 mg total) by mouth daily. 04/27/22 04/27/23 Yes Emokpae, Courage, MD  doxycycline (VIBRA-TABS) 100 MG tablet Take 1 tablet (100 mg total) by mouth 2 (two) times daily for 5 days. 04/27/22 05/02/22 Yes Emokpae, Courage, MD  DULoxetine (CYMBALTA) 60 MG capsule Take 60 mg by mouth daily.  07/15/21  Yes [provider]  furosemide (LASIX) 40 MG tablet Take 1 tablet (40 mg total) by mouth daily. 04/27/22 10/24/22 Yes Emokpae, Courage, MD  inFLIXimab (REMICADE IV) Inject 1 Syringe into the vein every 3 (three) months.   Yes [provider]  lactulose, encephalopathy, (CHRONULAC) 10 GM/15ML SOLN Take 30 mLs (20 g total) by mouth 2 (two) times daily. Goal is 2 to 3 soft/mushy BMs, Ok to take additional dose if No BM in 24 hrs 04/27/22  Yes Emokpae, Courage, MD  levothyroxine (SYNTHROID) 75 MCG tablet Take 75 mcg by mouth daily. 12/26/21  Yes [provider]  meclizine (ANTIVERT) 25 MG tablet Take 25 mg by mouth 3 (three) times daily as needed for dizziness.   Yes [provider]  Melatonin 10 MG CAPS Take 10 mg by mouth at bedtime as needed (sleep).   Yes [provider]  metFORMIN (GLUCOPHAGE-XR) 500 MG 24 hr tablet Take 1 tablet (500 mg total) by mouth 2 (two) times daily with a meal. 04/27/22  Yes Emokpae, Courage, MD  metoprolol tartrate (LOPRESSOR) 25 MG tablet Take 1 tablet (25 mg total) by mouth 2 (two) times daily. 04/27/22  Yes Emokpae, Courage, MD  midodrine (PROAMATINE) 5 MG tablet Take 1 tablet (5 mg total) by mouth 2 (two) times daily with a meal. 04/27/22  Yes Emokpae, Courage, MD  pantoprazole (PROTONIX) 40 MG tablet Take 40 mg by mouth 2 (two) times daily.   Yes [provider]  pilocarpine (SALAGEN) 5 MG tablet Take 5 mg by mouth 2 (two) times daily.   Yes [provider]  potassium chloride (KLOR-CON) 10 MEQ tablet Take 1 tablet (10 mEq total) by mouth daily. Take While taking Lasix/furosemide 04/27/22  Yes Emokpae, Courage, MD  rifaximin (XIFAXAN) 550 MG TABS tablet Take 1 tablet (550 mg total) by mouth 2 (two) times daily. 04/27/22  Yes Emokpae, Courage, MD  rosuvastatin (CRESTOR) 10 MG tablet Take 10 mg by mouth daily.   Yes [provider]  spironolactone (ALDACTONE) 50 MG tablet Take 1 tablet (50 mg  total) by mouth daily. 04/27/22 04/27/23 Yes Emokpae, Courage, MD  sucralfate (CARAFATE) 1 g tablet Take 1 g by mouth 4 (four) times daily. 01/25/22  Yes [provider]  blood glucose meter kit and supplies Dispense based on patient and insurance preference. Use up to four times daily as directed. (FOR ICD-10 E10.9, E11.9). 09/22/21   Murlean Iba, MD  Critical care time:  43 minutes     Georgann Housekeeper, AGACNP-BC  Pulmonary & Critical Care  See Amion for personal pager PCCM on call pager [phone] until 7pm. Please call Elink 7p-7a. [phone]  05/01/2022 9:54 AM

## 2022-05-01 NOTE — Progress Notes (Signed)
ABG results reported to NP w/ CCM, no new RT orders received at this time.

## 2022-05-01 NOTE — Progress Notes (Signed)
Initial Nutrition Assessment  DOCUMENTATION CODES:   Non-severe (moderate) malnutrition in context of chronic illness  INTERVENTION:   When OG output decreases, recommend begin TF: Peptamen 1.5 at 45 ml/h (1080 ml per day) Prosource TF20 60 ml BID  Provides 1780 kcal, 113 gm protein, 829 ml free water daily.  Recommend MVI with minerals 1 tablet via tube once daily.  Given low folate level on 36/14/43, recommend folic acid supplementation, 1 mg PO/per tube once daily.   NUTRITION DIAGNOSIS:   Moderate Malnutrition related to acute illness, chronic illness (hepatic encephalopathy, respiratory distress, NASH) as evidenced by mild muscle depletion, percent weight loss (12% weight loss within 3 months).  GOAL:   Patient will meet greater than or equal to 90% of their needs  MONITOR:   Vent status, TF tolerance, Labs, I & O's  REASON FOR ASSESSMENT:   Ventilator    ASSESSMENT:   66 yo female admitted with hypoxia and worsening SOB. PMH includes NASH cirrhosis, CHF, PE, A fib, OSA, HTN, chronic respiratory failure on 3 L oxygen via [place] at baseline, GERD, depression, hypothyroidism. Recent admission at [facility] for new onset A fib and PNA.  Patient is currently intubated on ventilator support. MV: 11.9 L/min Temp (24hrs), Avg:97.7 F (36.5 C), Min:97.1 F (36.2 C), Max:98.1 F (36.7 C)  Holding off on TF initiation today d/t high output from OG tube. 450 ml output documented x 24 hours.   I/O net -2 L since admission. Patient with generalized mild pitting edema and mild pitting edema to BLE.   Weight history reviewed. Over the past 2-3 months, patient has had 12% weight loss, which is severe. During hospitalization at [facility] last week, patient was noted to have mild muscle depletion in temple and clavicle areas c/w moderate malnutrition. Suspect malnutrition is ongoing.    Labs reviewed. Na 146, Folate 5.4 (L) on 04/20/22 CBG: 137-127  Medications reviewed and include Novolog,  lactulose, Solu-medrol, Aldactone, Carafate, Precedex, fentanyl, Lasix.  NUTRITION - FOCUSED PHYSICAL EXAM:  Unable to complete  Diet Order:   Diet Order     None       EDUCATION NEEDS:   No education needs have been identified at this time  Skin:  Skin Assessment: Reviewed RN Assessment  Last BM:  10/23 type 6  Height:   Ht Readings from Last 1 Encounters:  04/23/22 5' 4"  (1.626 m)    Weight:   Wt Readings from Last 1 Encounters:  05/01/22 112.2 kg    Ideal Body Weight:  54.5 kg  BMI:  Body mass index is 42.46 kg/m.  Estimated Nutritional Needs:   Kcal:  1650-1850  Protein:  110-135 gm  Fluid:  1.7-1.8 L   Lucas Mallow RD, LDN, CNSC Please refer to Amion for contact information.

## 2022-05-01 NOTE — Progress Notes (Signed)
Heart Failure Navigator Progress Note  Assessed for Heart & Vascular TOC clinic readiness.  Patient does not meet criteria due to Respiratory failure/ PNA this admission.   Navigator available for reassessment of patient.   Earnestine Leys, BSN, Clinical cytogeneticist Only

## 2022-05-02 DIAGNOSIS — I5033 Acute on chronic diastolic (congestive) heart failure: Secondary | ICD-10-CM | POA: Diagnosis not present

## 2022-05-02 LAB — COMPREHENSIVE METABOLIC PANEL
ALT: 19 U/L (ref 0–44)
AST: 34 U/L (ref 15–41)
Albumin: 2.7 g/dL — ABNORMAL LOW (ref 3.5–5.0)
Alkaline Phosphatase: 50 U/L (ref 38–126)
Anion gap: 6 (ref 5–15)
BUN: 40 mg/dL — ABNORMAL HIGH (ref 8–23)
CO2: 33 mmol/L — ABNORMAL HIGH (ref 22–32)
Calcium: 9 mg/dL (ref 8.9–10.3)
Chloride: 106 mmol/L (ref 98–111)
Creatinine, Ser: 0.9 mg/dL (ref 0.44–1.00)
GFR, Estimated: >60 mL/min (ref >60)
Glucose, Bld: 149 mg/dL — ABNORMAL HIGH (ref 70–99)
Potassium: 3.2 mmol/L — ABNORMAL LOW (ref 3.5–5.1)
Sodium: 145 mmol/L (ref 135–145)
Total Bilirubin: 1.1 mg/dL (ref 0.3–1.2)
Total Protein: 9.6 g/dL — ABNORMAL HIGH (ref 6.5–8.1)

## 2022-05-02 LAB — AMMONIA: Ammonia: 64 umol/L — ABNORMAL HIGH (ref 9–35)

## 2022-05-02 LAB — POCT I-STAT 7, (LYTES, BLD GAS, ICA,H+H)
Acid-Base Excess: 9 mmol/L — ABNORMAL HIGH (ref 0.0–2.0)
Bicarbonate: 35.2 mmol/L — ABNORMAL HIGH (ref 20.0–28.0)
Calcium, Ion: 1.26 mmol/L (ref 1.15–1.40)
HCT: 31 % — ABNORMAL LOW (ref 36.0–46.0)
Hemoglobin: 10.5 g/dL — ABNORMAL LOW (ref 12.0–15.0)
O2 Saturation: 96 %
Patient temperature: 98.6
Potassium: 3.3 mmol/L — ABNORMAL LOW (ref 3.5–5.1)
Sodium: 149 mmol/L — ABNORMAL HIGH (ref 135–145)
TCO2: 37 mmol/L — ABNORMAL HIGH (ref 22–32)
pCO2 arterial: 53.7 mmHg — ABNORMAL HIGH (ref 32–48)
pH, Arterial: 7.425 (ref 7.35–7.45)
pO2, Arterial: 82 mmHg — ABNORMAL LOW (ref 83–108)

## 2022-05-02 LAB — CULTURE, BAL-QUANTITATIVE W GRAM STAIN
Culture: NO GROWTH
Special Requests: NORMAL

## 2022-05-02 LAB — PHOSPHORUS
Phosphorus: 2.2 mg/dL — ABNORMAL LOW (ref 2.5–4.6)
Phosphorus: 4.9 mg/dL — ABNORMAL HIGH (ref 2.5–4.6)

## 2022-05-02 LAB — CBC
HCT: 31.8 % — ABNORMAL LOW (ref 36.0–46.0)
Hemoglobin: 10.1 g/dL — ABNORMAL LOW (ref 12.0–15.0)
MCH: 32 pg (ref 26.0–34.0)
MCHC: 31.8 g/dL (ref 30.0–36.0)
MCV: 100.6 fL — ABNORMAL HIGH (ref 80.0–100.0)
Platelets: 139 10*3/uL — ABNORMAL LOW (ref 150–400)
RBC: 3.16 MIL/uL — ABNORMAL LOW (ref 3.87–5.11)
RDW: 16.8 % — ABNORMAL HIGH (ref 11.5–15.5)
WBC: 4.9 10*3/uL (ref 4.0–10.5)
nRBC: 0 % (ref 0.0–0.2)

## 2022-05-02 LAB — BRAIN NATRIURETIC PEPTIDE: B Natriuretic Peptide: 168.5 pg/mL — ABNORMAL HIGH (ref 0.0–100.0)

## 2022-05-02 LAB — GLUCOSE, CAPILLARY
Glucose-Capillary: 153 mg/dL — ABNORMAL HIGH (ref 70–99)
Glucose-Capillary: 131 mg/dL — ABNORMAL HIGH (ref 70–99)
Glucose-Capillary: 163 mg/dL — ABNORMAL HIGH (ref 70–99)
Glucose-Capillary: 112 mg/dL — ABNORMAL HIGH (ref 70–99)
Glucose-Capillary: 163 mg/dL — ABNORMAL HIGH (ref 70–99)

## 2022-05-02 LAB — PROTIME-INR
INR: 1.7 — ABNORMAL HIGH (ref 0.8–1.2)
Prothrombin Time: 19.6 seconds — ABNORMAL HIGH (ref 11.4–15.2)

## 2022-05-02 LAB — MAGNESIUM: Magnesium: 1.6 mg/dL — ABNORMAL LOW (ref 1.7–2.4)

## 2022-05-02 MED ORDER — ADULT MULTIVITAMIN W/MINERALS CH
1.0000 | ORAL_TABLET | Freq: Every day | ORAL | Status: DC
  Administered 2022-05-02 – 2022-05-03 (×2): 1
  Filled 2022-05-02 (×3): qty 1

## 2022-05-02 MED ORDER — FUROSEMIDE 10 MG/ML IJ SOLN
40.0000 mg | Freq: Two times a day (BID) | INTRAMUSCULAR | Status: DC
  Administered 2022-05-02 – 2022-05-06 (×10): 40 mg via INTRAVENOUS
  Filled 2022-05-02 (×10): qty 4

## 2022-05-02 MED ORDER — PEPTAMEN 1.5 CAL PO LIQD
1000.0000 mL | ORAL | Status: DC
  Administered 2022-05-02: 1000 mL
  Filled 2022-05-02 (×2): qty 1000

## 2022-05-02 MED ORDER — POTASSIUM CHLORIDE 20 MEQ PO PACK
40.0000 meq | PACK | Freq: Once | ORAL | Status: AC
  Administered 2022-05-02: 40 meq
  Filled 2022-05-02: qty 2

## 2022-05-02 MED ORDER — MAGNESIUM SULFATE 4 GM/100ML IV SOLN
4.0000 g | Freq: Once | INTRAVENOUS | Status: AC
  Administered 2022-05-02: 4 g via INTRAVENOUS
  Filled 2022-05-02: qty 100

## 2022-05-02 MED ORDER — FOLIC ACID 1 MG PO TABS
1.0000 mg | ORAL_TABLET | Freq: Every day | ORAL | Status: DC
  Administered 2022-05-03: 1 mg
  Filled 2022-05-02 (×2): qty 1

## 2022-05-02 MED ORDER — LACTULOSE 10 GM/15ML PO SOLN
20.0000 g | Freq: Three times a day (TID) | ORAL | Status: DC
  Administered 2022-05-02 – 2022-05-03 (×3): 20 g
  Filled 2022-05-02 (×3): qty 30

## 2022-05-02 MED ORDER — PROSOURCE TF20 ENFIT COMPATIBL EN LIQD
60.0000 mL | Freq: Two times a day (BID) | ENTERAL | Status: DC
  Administered 2022-05-02 (×2): 60 mL
  Filled 2022-05-02 (×2): qty 60

## 2022-05-02 MED ORDER — INSULIN ASPART 100 UNIT/ML IJ SOLN
0.0000 [IU] | INTRAMUSCULAR | Status: DC
  Administered 2022-05-02 – 2022-05-03 (×4): 3 [IU] via SUBCUTANEOUS
  Administered 2022-05-05 – 2022-05-06 (×6): 2 [IU] via SUBCUTANEOUS

## 2022-05-02 MED ORDER — POTASSIUM PHOSPHATES 15 MMOLE/5ML IV SOLN
30.0000 mmol | Freq: Once | INTRAVENOUS | Status: AC
  Administered 2022-05-02: 30 mmol via INTRAVENOUS
  Filled 2022-05-02: qty 10

## 2022-05-02 NOTE — Progress Notes (Addendum)
Nutrition Follow-up  DOCUMENTATION CODES:   Non-severe (moderate) malnutrition in context of chronic illness  INTERVENTION:   Initiate TF via OG tube: Peptamen 1.5 at 25 ml/h, increase by 10 ml every 12 hours to goal rate of 45 ml/h (1080 ml per day) Prosource TF20 60 ml BID  Provides 1780 kcal, 113 gm protein, 829 ml free water daily.  MVI with minerals 1 tablet via tube once daily.  Folic acid 1 mg per tube once daily.   Monitor magnesium, potassium, and phosphorus BID for at least 3 days, MD to replete as needed, as pt is at risk for refeeding syndrome given recent minimal intake and low baseline phos, K, and mag levels.  NUTRITION DIAGNOSIS:   Moderate Malnutrition related to acute illness, chronic illness (hepatic encephalopathy, respiratory distress, NASH) as evidenced by mild muscle depletion, percent weight loss (12% weight loss within 3 months).  Ongoing   GOAL:   Patient will meet greater than or equal to 90% of their needs  Progressing with initiation of TF  MONITOR:   Vent status, TF tolerance, Labs, I & O's  REASON FOR ASSESSMENT:   Consult Enteral/tube feeding initiation and management  ASSESSMENT:   66 yo female admitted with hypoxia and worsening SOB. PMH includes NASH cirrhosis, CHF, PE, A fib, OSA, HTN, chronic respiratory failure on 3 L oxygen via [place] at baseline, GERD, depression, hypothyroidism. Recent admission at [facility] for new onset A fib and PNA.  Patient remains intubated on ventilator support. MV: 8.9 L/min Temp (24hrs), Avg:97.7 F (36.5 C), Min:97.3 F (36.3 C), Max:98 F (36.7 C)  OG tube in place. 65-250 ml output since 6pm yesterday. Okay to begin TF per discussion with MD. Will need to monitor for refeeding syndrome, given recent minimal intake and baseline low Phos, K, and Mag. Receiving potassium phosphate for repletion. Will advance TF very slowly. Will also add folic acid supplementation for low folate level on 04/20/22.   I/O net  -5.6 L since admission.    Labs reviewed. Na 149, K 3.3, Phos 2.2, mag 1.6, Folate 5.4 (L) on 04/20/22 CBG: 153-131  Medications reviewed and include Lasix, Novolog, lactulose, Solu-medrol, Aldactone, Carafate, Precedex, fentanyl, potassium phosphate.  NUTRITION - FOCUSED PHYSICAL EXAM:  Unable to complete  Diet Order:   Diet Order     None       EDUCATION NEEDS:   No education needs have been identified at this time  Skin:  Skin Assessment: Reviewed RN Assessment  Last BM:  10/24 type 6  Height:   Ht Readings from Last 1 Encounters:  04/23/22 5' 4"  (1.626 m)    Weight:   Wt Readings from Last 1 Encounters:  05/02/22 107.4 kg    Ideal Body Weight:  54.5 kg  BMI:  Body mass index is 40.64 kg/m.  Estimated Nutritional Needs:   Kcal:  1650-1850  Protein:  110-135 gm  Fluid:  1.7-1.8 L   Lucas Mallow RD, LDN, CNSC Please refer to Amion for contact information.

## 2022-05-02 NOTE — Progress Notes (Signed)
[facility] Progress Note Patient Name: Yolanda [name] DOB: [birth year] MRN: [id number]   Date of Service  05/02/2022  HPI/Events of Note  Multiple issues: 1. Agitation - Nursing request to renew bilateral wrist restraints. 2. Hyperglycemia - Patient now on tube feeds and AC Novolog SSI.  eICU Interventions  Plan: Will renew bilateral soft wrist restraints X 12 hours. Will change to Q 4 hour moderate Novolog SSI.      Intervention Category Major Interventions: Hyperglycemia - active titration of insulin therapy;Delirium, psychosis, severe agitation - evaluation and management  Yolanda [name] 05/02/2022, 8:24 PM

## 2022-05-02 NOTE — Progress Notes (Signed)
NAME:  Yolanda [name], MRN:  [id number], DOB:  14-May-1956, LOS: 4 ADMISSION DATE:  04/28/2022, CONSULTATION DATE:  05/02/22 REFERRING MD:  Cathlean Sauer, CHIEF COMPLAINT:  Worsening respiratory status   History of Present Illness:  Pertinent past medical history of [name] cirrhosis, HFpEF, obesity, PE not on AC, A-fib not on AC, chronic respiratory failure on 3L [place] O2,  OSA, hypertension, anxiety, depression, GERD, hypothyroidism.  She was recently admitted at [facility] 10/11-10/19 with AMS and acute cystitis.   Work-up was negative for PE, found to have new onset Afib, multi-focal PNA and effusion and was diuresed and discharged home on 10/19.   Received Flagyl, Cefepime and Doxycycline inpatient and discharged Augmentin and Doxycyline.    She was home one day before returning to the ER 10/20 with worsening shortness  of breath and hypoxia.   She was in significant respiratory distress in the ED and was placed on Bipap.  CXR again showed multi-focal bilateral infiltrates.  She was admitted and started back on Cefepime.  On 10/22 her respiratory status began to decline despite bipap and PCCM was consulted for transfer to the ICU  Pertinent  Medical History   has a past medical history of Abnormal uterine bleeding, Anxiety, Arthritis, Chronic abdominal pain, Chronic pain in left foot, Depression, Elevated liver function tests (2018), Endometriosis, Fibromyalgia, GERD (12/21/2009), Hypertension, Hypothyroidism, IBS (irritable bowel syndrome), Internal hemorrhoids, Kidney stone, Liver cirrhosis secondary to NASH ([place]), Obesity, morbid ([place]), PONV (postoperative nausea and vomiting), Psoriatic arthritis ([place]), Pulmonary embolism ([place]) (12/2005), Rheumatoid arthritis ([place]), Sleep apnea, and Vitamin D deficiency.   Significant Hospital Events: Including procedures, antibiotic start and stop dates in addition to other pertinent events   10/19 discharged home after admission for PNA 10/20 Admitted with continued PNA  and hypoxic respiratory failure 10/22 Worsening hypoxic respiratory failure despite Bipap, ICU transfer, intubated  Interim History / Subjective:  No events. Diuresed very well. Improved vent mechanics.  Objective   Blood pressure (!) 148/71, pulse (!) 54, temperature 97.8 F (36.6 C), temperature source Axillary, resp. rate (!) 25, weight 107.4 kg, last menstrual period 07/11/2007, SpO2 97 %.    Vent Mode: PRVC FiO2 (%):  [40 %-50 %] 40 % Set Rate:  [22 bmp-30 bmp] 22 bmp Vt Set:  [400 mL] 400 mL PEEP:  [5 cmH20-10 cmH20] 5 cmH20 Plateau Pressure:  [28 cmH20-33 cmH20] 28 cmH20   Intake/Output Summary (Last 24 hours) at 05/02/2022 1035 Last data filed at 05/02/2022 1000 Gross per 24 hour  Intake 1634.45 ml  Output 4640 ml  Net -3005.55 ml    Filed Weights   04/30/22 0400 05/01/22 0500 05/02/22 0500  Weight: 116 kg 112.2 kg 107.4 kg    Sedated, no distress Pupils small, equal Lungs diminished, still has pretty high plats (30s) No edema Abd soft, +BS  WBC improved Various electrolyte derangements being addressed No CXR  Resolved Hospital Problem list     Assessment & Plan:   Acute on Chronic Hypoxic Respiratory Failure Multifocal PNA: treat as HCAP vs aspiration Acute on chronic HFpEF - Continue zosyn, f/u BAL results - Nebs as ordered - Lasix to BID  Atrial Fibrillation with RVR  -Hold lopressor with  borderline BPs -continue Asa -holding anti-coagulation in the setting of liver failure and high risk of bleeding; will challenge tomorrow  NASH liver cirrhosis  -continue rifaximin and lactulose - spironolactone to home dose.    Type 2 DM -holding metformin -SSI  Hypothyroidism -continue Levothyroxine  GERD - PPI  and carafate  Nutrition: -Ok to start TF today  Best Practice (right click and "Reselect all SmartList Selections" daily)   Diet/type: NPO DVT prophylaxis: SCD GI prophylaxis: PPI Lines: N/A Foley:  Yes, and it is still  needed Code Status:  full code Last date of multidisciplinary goals of care discussion [Wants 3-7 day trial on vent, family updated at bedside 10/24]  33 min cc time Erskine Emery MD  See Shea Evans for personal pager PCCM on call pager [phone] until 7pm. Please call Elink 7p-7a. [phone]  05/02/2022 10:35 AM

## 2022-05-03 ENCOUNTER — Inpatient Hospital Stay (HOSPITAL_COMMUNITY): Payer: Medicare Other

## 2022-05-03 DIAGNOSIS — J811 Chronic pulmonary edema: Secondary | ICD-10-CM

## 2022-05-03 DIAGNOSIS — I5033 Acute on chronic diastolic (congestive) heart failure: Secondary | ICD-10-CM | POA: Diagnosis not present

## 2022-05-03 LAB — BASIC METABOLIC PANEL
Anion gap: 6 (ref 5–15)
Anion gap: 12 (ref 5–15)
BUN: 41 mg/dL — ABNORMAL HIGH (ref 8–23)
BUN: 33 mg/dL — ABNORMAL HIGH (ref 8–23)
CO2: 35 mmol/L — ABNORMAL HIGH (ref 22–32)
CO2: 33 mmol/L — ABNORMAL HIGH (ref 22–32)
Calcium: 9.7 mg/dL (ref 8.9–10.3)
Calcium: 10.5 mg/dL — ABNORMAL HIGH (ref 8.9–10.3)
Chloride: 108 mmol/L (ref 98–111)
Chloride: 98 mmol/L (ref 98–111)
Creatinine, Ser: 0.86 mg/dL (ref 0.44–1.00)
Creatinine, Ser: 1.12 mg/dL — ABNORMAL HIGH (ref 0.44–1.00)
GFR, Estimated: >60 mL/min (ref >60)
GFR, Estimated: 54 mL/min — ABNORMAL LOW (ref >60)
Glucose, Bld: 142 mg/dL — ABNORMAL HIGH (ref 70–99)
Glucose, Bld: 118 mg/dL — ABNORMAL HIGH (ref 70–99)
Potassium: 3.4 mmol/L — ABNORMAL LOW (ref 3.5–5.1)
Potassium: 3.6 mmol/L (ref 3.5–5.1)
Sodium: 149 mmol/L — ABNORMAL HIGH (ref 135–145)
Sodium: 143 mmol/L (ref 135–145)

## 2022-05-03 LAB — GLUCOSE, CAPILLARY
Glucose-Capillary: 104 mg/dL — ABNORMAL HIGH (ref 70–99)
Glucose-Capillary: 105 mg/dL — ABNORMAL HIGH (ref 70–99)
Glucose-Capillary: 156 mg/dL — ABNORMAL HIGH (ref 70–99)
Glucose-Capillary: 169 mg/dL — ABNORMAL HIGH (ref 70–99)
Glucose-Capillary: 173 mg/dL — ABNORMAL HIGH (ref 70–99)
Glucose-Capillary: 72 mg/dL (ref 70–99)

## 2022-05-03 LAB — COMPREHENSIVE METABOLIC PANEL
ALT: 23 U/L (ref 0–44)
AST: 38 U/L (ref 15–41)
Albumin: 2.7 g/dL — ABNORMAL LOW (ref 3.5–5.0)
Alkaline Phosphatase: 54 U/L (ref 38–126)
Anion gap: 3 — ABNORMAL LOW (ref 5–15)
BUN: 33 mg/dL — ABNORMAL HIGH (ref 8–23)
CO2: 37 mmol/L — ABNORMAL HIGH (ref 22–32)
Calcium: 10.1 mg/dL (ref 8.9–10.3)
Chloride: 105 mmol/L (ref 98–111)
Creatinine, Ser: 0.92 mg/dL (ref 0.44–1.00)
GFR, Estimated: >60 mL/min (ref >60)
Glucose, Bld: 112 mg/dL — ABNORMAL HIGH (ref 70–99)
Potassium: 3.9 mmol/L (ref 3.5–5.1)
Sodium: 145 mmol/L (ref 135–145)
Total Bilirubin: 1.3 mg/dL — ABNORMAL HIGH (ref 0.3–1.2)
Total Protein: 9.8 g/dL — ABNORMAL HIGH (ref 6.5–8.1)

## 2022-05-03 LAB — CBC
HCT: 32.8 % — ABNORMAL LOW (ref 36.0–46.0)
Hemoglobin: 10.3 g/dL — ABNORMAL LOW (ref 12.0–15.0)
MCH: 31.9 pg (ref 26.0–34.0)
MCHC: 31.4 g/dL (ref 30.0–36.0)
MCV: 101.5 fL — ABNORMAL HIGH (ref 80.0–100.0)
Platelets: 132 10*3/uL — ABNORMAL LOW (ref 150–400)
RBC: 3.23 MIL/uL — ABNORMAL LOW (ref 3.87–5.11)
RDW: 17.4 % — ABNORMAL HIGH (ref 11.5–15.5)
WBC: 7.4 10*3/uL (ref 4.0–10.5)
nRBC: 0 % (ref 0.0–0.2)

## 2022-05-03 LAB — MAGNESIUM
Magnesium: 2.4 mg/dL (ref 1.7–2.4)
Magnesium: 1.9 mg/dL (ref 1.7–2.4)

## 2022-05-03 LAB — AMMONIA: Ammonia: 23 umol/L (ref 9–35)

## 2022-05-03 LAB — PHOSPHORUS: Phosphorus: 2.4 mg/dL — ABNORMAL LOW (ref 2.5–4.6)

## 2022-05-03 LAB — TROPONIN I (HIGH SENSITIVITY): Troponin I (High Sensitivity): 19 ng/L — ABNORMAL HIGH (ref <18)

## 2022-05-03 MED ORDER — ORAL CARE MOUTH RINSE: 15.0000 mL | OROMUCOSAL | Status: DC | PRN

## 2022-05-03 MED ORDER — HEPARIN (PORCINE) 25000 UT/250ML-% IV SOLN
1250.0000 [IU]/h | INTRAVENOUS | Status: AC
  Administered 2022-05-03: 1200 [IU]/h via INTRAVENOUS
  Filled 2022-05-03: qty 250

## 2022-05-03 MED ORDER — LIP MEDEX EX OINT
TOPICAL_OINTMENT | CUTANEOUS | Status: DC | PRN
  Filled 2022-05-03: qty 7

## 2022-05-03 MED ORDER — METOLAZONE 5 MG PO TABS
5.0000 mg | ORAL_TABLET | Freq: Once | ORAL | Status: AC
  Administered 2022-05-03: 5 mg
  Filled 2022-05-03: qty 1

## 2022-05-03 MED ORDER — POTASSIUM CHLORIDE 20 MEQ PO PACK: 40.0000 meq | PACK | Freq: Once | ORAL | Status: DC

## 2022-05-03 MED ORDER — ORAL CARE MOUTH RINSE: 15.0000 mL | OROMUCOSAL | Status: DC

## 2022-05-03 MED ORDER — LACTULOSE 10 GM/15ML PO SOLN: 20.0000 g | Freq: Every day | ORAL | Status: DC

## 2022-05-03 MED ORDER — PREDNISONE 20 MG PO TABS
40.0000 mg | ORAL_TABLET | Freq: Every day | ORAL | Status: DC
  Administered 2022-05-04: 40 mg
  Filled 2022-05-03: qty 2

## 2022-05-03 MED ORDER — POTASSIUM CHLORIDE 20 MEQ PO PACK
40.0000 meq | PACK | Freq: Once | ORAL | Status: AC
  Administered 2022-05-03: 40 meq
  Filled 2022-05-03: qty 2

## 2022-05-03 MED ORDER — POTASSIUM PHOSPHATES 15 MMOLE/5ML IV SOLN
15.0000 mmol | Freq: Once | INTRAVENOUS | Status: AC
  Administered 2022-05-03: 15 mmol via INTRAVENOUS
  Filled 2022-05-03: qty 5

## 2022-05-03 MED ORDER — AMIODARONE HCL IN DEXTROSE 360-4.14 MG/200ML-% IV SOLN
60.0000 mg/h | INTRAVENOUS | Status: AC
  Administered 2022-05-03: 60 mg/h via INTRAVENOUS
  Filled 2022-05-03: qty 200

## 2022-05-03 MED ORDER — AMIODARONE HCL IN DEXTROSE 360-4.14 MG/200ML-% IV SOLN
30.0000 mg/h | INTRAVENOUS | Status: DC
  Administered 2022-05-04 – 2022-05-11 (×16): 30 mg/h via INTRAVENOUS
  Filled 2022-05-03 (×18): qty 200

## 2022-05-03 MED ORDER — PREDNISONE 20 MG PO TABS: 40.0000 mg | ORAL_TABLET | Freq: Every day | ORAL | Status: DC

## 2022-05-03 MED ORDER — AMIODARONE HCL IN DEXTROSE 360-4.14 MG/200ML-% IV SOLN
INTRAVENOUS | Status: AC
  Administered 2022-05-03: 60 mg/h via INTRAVENOUS
  Filled 2022-05-03: qty 200

## 2022-05-03 MED ORDER — AMIODARONE LOAD VIA INFUSION
150.0000 mg | Freq: Once | INTRAVENOUS | Status: AC
  Administered 2022-05-03: 150 mg via INTRAVENOUS
  Filled 2022-05-03: qty 83.34

## 2022-05-03 MED ORDER — HEPARIN BOLUS VIA INFUSION
4000.0000 [IU] | Freq: Once | INTRAVENOUS | Status: AC
  Administered 2022-05-03: 4000 [IU] via INTRAVENOUS
  Filled 2022-05-03: qty 4000

## 2022-05-03 MED ORDER — ADENOSINE 6 MG/2ML IV SOLN
6.0000 mg | Freq: Once | INTRAVENOUS | Status: AC
  Administered 2022-05-03: 6 mg via INTRAVENOUS

## 2022-05-03 NOTE — Progress Notes (Addendum)
05/03/2022 Attending Addendum I have seen and evaluated the patient for respiratory failure  S:  No events, awake on vent.  O2 needs improved.  O: Blood pressure 122/66, pulse 97, temperature (!) 100.5 F (38.1 C), temperature source Axillary, resp. rate 15, weight 108 kg, last menstrual period 07/11/2007, SpO2 100 %.  Following commands Heart sounds regular Rhonci improved No edema Following commands  Renal function tolerating diuresis, sodium creeping up CBC stable  A:  Acute on chronic hypoxemic respiratory failure with some combination fluid overload and/or PNA: improving  R/o chronic aspiration syndrome  Afib/RVR  NASH cirrhosis  DM2  P:  - Wean to extubate hopefully today - Zosyn x 7 days. F/u BAL results - Push diuresis as tolerated by renal function - SLP eval +/- esophagram - Family updated at bedside  My cc time: 31 mins  Erskine Emery MD [facility] Prefer epic messenger for cross cover needs If after hours, please call E-link        NAME:  Yolanda [name], MRN:  [id number], DOB:  [birth year], LOS: 5 ADMISSION DATE:  04/28/2022, CONSULTATION DATE:  05/02/22  REFERRING MD:  Cathlean Sauer , CHIEF COMPLAINT:  Worsening Respiratory Status     History of Present Illness:  Pertinent past medical history of [name] cirrhosis, HFpEF, obesity, PE not on AC, A-fib not on AC, chronic respiratory failure on 3L [place] O2,  OSA, hypertension, anxiety, depression, GERD, hypothyroidism.  She was recently admitted at [facility] 10/11-10/19 with AMS and acute cystitis.   Work-up was negative for PE, found to have new onset Afib, multi-focal PNA and effusion and was diuresed and discharged home on 10/19. Received Flagyl, Cefepime and Doxycycline inpatient and discharged Augmentin and Doxycyline.    She was home one day before returning to the ER 10/20 with worsening shortness  of breath and hypoxia.   She was in significant respiratory distress in the ED and was placed on  Bipap.  CXR again showed multi-focal bilateral infiltrates.  She was admitted and started back on Cefepime.  On 10/22 her respiratory status began to decline despite bipap and PCCM was consulted for transfer to the ICU  Pertinent  Medical History  Pulmonary embolism not on AC A-fib not on [facility] HFpEF  Chronic respiratory failure on 3L [place] O2  Fibromyalgia  GERD  Hypertension  Hypothyroidism Liver cirrhosis secondary to NASH  Psoriatic arthritis   Rheumatoid arthritis  Sleep apnea   Significant Hospital Events: Including procedures, antibiotic start and stop dates in addition to other pertinent events   10/19 discharged home after admission for PNA 10/20 Admitted with continued PNA and hypoxic respiratory failure 10/22 Worsening hypoxic respiratory failure despite Bipap, ICU transfer, intubated 10/25 BAL results negative for organisms. Plan to extubate today.   Interim History / Subjective:  Resting comfortably on spontaneous breathing trial following commands no distress  Objective   Blood pressure (!) 122/59, pulse 97, temperature 98 F (36.7 C), temperature source Oral, resp. rate 15, weight 108 kg, last menstrual period 07/11/2007, SpO2 98 %.    Vent Mode: PSV;CPAP FiO2 (%):  [40 %] 40 % Set Rate:  [22 bmp] 22 bmp Vt Set:  [400 mL] 400 mL PEEP:  [5 cmH20] 5 cmH20 Pressure Support:  [10 cmH20] 10 cmH20 Plateau Pressure:  [20 cmH20-24 cmH20] 21 cmH20   Intake/Output Summary (Last 24 hours) at 05/03/2022 0820 Last data filed at 05/03/2022 0800 Gross per 24 hour  Intake 2182.03 ml  Output 3635 ml  Net -  1452.97 ml   Filed Weights   05/01/22 0500 05/02/22 0500 05/03/22 0248  Weight: 112.2 kg 107.4 kg 108 kg    Examination: General 66 year old female patient currently on SBT HEENT normocephalic atraumatic no jugular venous distention Pulmonary: Coarse, no accessory use.  Tidal volumes in the 500 range on SBT Cardiac: Regular rate and rhythm Abdomen: Soft  nontender Extremities: Trace edema Neuro: Awake oriented follows commands equal strength bilaterally GU: Clear yellow urine excellent urine output from Foley catheter.   Portable chest x-ray personally reviewed : Fairly significant right greater than left airspace disease however aeration has improved significantly comparing film from 10/22 [facility] Problem list    Assessment & Plan:  Acute on Chronic Hypoxic Respiratory Failure Due to multifocal PNA: treat as HCAP vs aspiration, acute on chronic HFpEF and pulmonary edema -Family endorses fairly significant history of poorly controlled reflux, choking after meals, and at times choking waking up at night.  She has been intolerant of CPAP in the past, with family raising concern about even aspirating on CPAP support  plan Day #4 of 7 Zosyn SBT, with extubation today Continue IV Lasix twice daily Spirometry and supplemental oxygen postop Will need esophagram when clinically stable We will hold off on CPAP postextubation although this would be ideal if aspiration was not a concern Aspiration precautions and reflux precautions postextubation  OSA Plan Supplemental oxygen at at bedtime  Atrial Fibrillation with RVR Plan  Holding Lopressor given borderline hypotension Continuing aspirin Off anticoagulation due to bleeding risk with liver disease  Fluid and Electrolyte Abnormalities: Hyponatremia, hypokalemia Plan  Free water replacement; holding for now; thirst/oral intake should regulate once extubated but if not will start D5 10/26 Potassium replacement Serial chemistries  Type 2 DM Plan Holding metformin Sliding scale insulin  NASH liver cirrhosis  Plan Continue lactulose and rifaximin mean  Spironolactone at home dosing    Hypothyroidism Plan  Continue levothyroxine  GERD Plan PPI, Carafate, reflux precautions, esophagram as mentioned above   Nutrition: Plan Tube feeds Best Practice (right click and  "Reselect all SmartList Selections" daily)   Diet/type: tubefeeds DVT prophylaxis: SCD GI prophylaxis: PPI Lines: N/A Foley:  Yes, and it is still needed Code Status:  full code Last date of multidisciplinary goals of care discussion [pending]   My cct 32 min  Erick Colace ACNP-BC [place] Pager # [phone] OR # [phone] if no answer

## 2022-05-03 NOTE — Procedures (Signed)
Extubation Procedure Note  Patient Details:   Name: Yolanda [name] DOB: 1955/07/19 MRN: [id number]   Airway Documentation:    Vent end date: 05/03/22 Vent end time: 1010   Evaluation  O2 sats: stable throughout Complications: No apparent complications Patient did tolerate procedure well. Bilateral Breath Sounds: Clear, Diminished   No  Patient extubated per order to 4L [place] with no apparent complications. Positive cuff leak was noted prior to extubation. Patient is able to follow commands and has strong cough but was not able to speak at this time. CCM notified. Vitals are stable and sats are 99%. RT will continue to monitor.   Dashan Chizmar Clyda Greener 05/03/2022, 10:16 AM

## 2022-05-03 NOTE — Progress Notes (Signed)
Pharmacy Antibiotic Note  Yolanda [name] is a 66 y.o. female admitted on 04/28/2022 with pneumonia.  Pharmacy has been consulted for vancomycin and cefepime dosing. Patient is afebrile, on Bi-PAP and in respiratory distress. Previously on antibiotics from 10/11 to 10/19 for enterobacter aerogenes UTI and pneumonia at previous admission at [facility]. Scr 0.89 is stable, baseline ~0.6. Pt is extubated and clinically improving.  Plan: 7 day stop date entered for Zosyn 3.375g IV q 8 hrs per Dr. Tamala Julian  Weight: 108 kg (238 lb 1.6 oz)  Temp (24hrs), Avg:98.3 F (36.8 C), Min:97.1 F (36.2 C), Max:99.6 F (37.6 C)  Recent Labs  Lab 04/27/22 0321 04/28/22 1424 04/28/22 1429 04/28/22 1853 04/29/22 0044 04/30/22 0036 05/01/22 0411 05/02/22 0456 05/03/22 0249  WBC 8.4  --  11.8*  --   --   --  11.6* 4.9 7.4  CREATININE 0.64  --  0.72  --  0.76 0.87 1.10* 0.90 0.86  LATICACIDVEN  --  1.8  --  1.5  --   --   --   --   --      Estimated Creatinine Clearance: 76.2 mL/min (by C-G formula based on SCr of 0.86 mg/dL).    Allergies  Allergen Reactions   Azithromycin Hives   Lisinopril Cough   Morphine Hives   Sulfa Antibiotics Other (See Comments)    Yeast infection    Antimicrobials this admission: CTX 10/11 >> 10/15 Doxycycline 10/11, 10/15>> 10/20 Fluconazole x1 10/15 MTZ 10/15>> 10/17 Vancomycin x1 10/15, 10/22 >>  Cefepime 10/20 >> 10/2, 10/22 >>   Microbiology results: 10/11 BCx: neg 10/11 UCx: >199k col E Aerogenes   10/15 MRSA PCR: neg 10/22 MRSA PCR: neg   Thank you for allowing pharmacy to participate in this patient's care.  Reatha Harps, PharmD PGY2 Pharmacy Resident 05/04/2022 5:23 AM Check [email or web address] for unit specific pharmacy number

## 2022-05-03 NOTE — Progress Notes (Signed)
ANTICOAGULATION CONSULT NOTE - Initial Consult  Pharmacy Consult for Heparin Indication: atrial fibrillation  Allergies  Allergen Reactions   Azithromycin Hives   Lisinopril Cough   Morphine Hives   Sulfa Antibiotics Other (See Comments)    Yeast infection    Patient Measurements: Height: 5' 4"  (162.6 cm) Weight: 108 kg (238 lb 1.6 oz) IBW/kg (Calculated) : 54.7 Heparin Dosing Weight: 80kg  Vital Signs: Temp: 98.7 F (37.1 C) (10/25 1559) Temp Source: Oral (10/25 1559) BP: 125/88 (10/25 2000) Pulse Rate: 91 (10/25 2000)  Labs: Recent Labs    05/01/22 0411 05/01/22 1151 05/02/22 0456 05/02/22 0614 05/03/22 0249 05/03/22 1615  HGB 9.3*   < > 10.1* 10.5* 10.3*  --   HCT 29.7*   < > 31.8* 31.0* 32.8*  --   PLT 156  --  139*  --  132*  --   LABPROT  --   --  19.6*  --   --   --   INR  --   --  1.7*  --   --   --   CREATININE 1.10*  --  0.90  --  0.86 0.92   < > = values in this interval not displayed.    Estimated Creatinine Clearance: 71.2 mL/min (by C-G formula based on SCr of 0.92 mg/dL).   Medical History: Past Medical History:  Diagnosis Date   Abnormal uterine bleeding    Anxiety    Arthritis    Chronic abdominal pain    Chronic pain in left foot    Depression    Elevated liver function tests 2018   Endometriosis    Fibromyalgia    GERD 12/21/2009   Qualifier: Diagnosis of  By: Craige Cotta     Hypertension    stopped meds in Aug 2015   Hypothyroidism    IBS (irritable bowel syndrome)    Internal hemorrhoids    Kidney stone    Liver cirrhosis secondary to NASH ([employer])    Obesity, morbid ([facility])    PONV (postoperative nausea and vomiting)    Psoriatic arthritis ([place])    Pulmonary embolism ([place]) 12/2005   Qualifier: Diagnosis of  By: Kellie Simmering LPN, Almyra Free     Rheumatoid arthritis [facility])    Sleep apnea    Vitamin D deficiency     Medications:  Scheduled:   aspirin  81 mg Per Tube Daily   budesonide (PULMICORT) nebulizer solution  0.5 mg  Nebulization BID   Chlorhexidine Gluconate Cloth  6 each Topical Daily   folic acid  1 mg Per Tube Daily   furosemide  40 mg Intravenous BID   guaiFENesin  15 mL Per Tube Q6H   heparin  4,000 Units Intravenous Once   insulin aspart  0-15 Units Subcutaneous Q4H   ipratropium-albuterol  3 mL Nebulization Q6H   [START ON 05/04/2022] lactulose  20 g Per Tube Daily   levothyroxine  75 mcg Per Tube Q0600   multivitamin with minerals  1 tablet Per Tube Daily   pantoprazole  40 mg Per Tube BID   pilocarpine  5 mg Per Tube BID   [START ON 05/04/2022] predniSONE  40 mg Per Tube Q breakfast   rifaximin  550 mg Per Tube BID   rosuvastatin  10 mg Per Tube Daily   spironolactone  50 mg Per Tube Daily   sucralfate  1 g Per Tube TID WC & HS   Infusions:   sodium chloride Stopped (04/30/22 0410)   amiodarone 60  mg/hr (05/03/22 2225)   Followed by   Derrill Memo ON 05/04/2022] amiodarone     heparin     piperacillin-tazobactam (ZOSYN)  IV 12.5 mL/hr at 05/03/22 1900    Assessment: 65 YOF admitted for CHF exacerbation with Afib not on anticoagulation PTA due to liver disease. Overnight experienced Afib with RVR. Pharmacy consulted to start heparin.  Goal of Therapy:  Heparin level 0.3-0.7 units/ml Monitor platelets by anticoagulation protocol: Yes   Plan:  Heparin 4000 units IV bolus once Start heparin 1200 units/hr (15 units/kg/hr) Check 6hr heparin level at 0600 on 10/26 Daily heparin level and CBC Monitor for signs/symptoms of bleeding  Merrilee Jansky, PharmD Clinical Pharmacist 05/03/2022,10:40 PM

## 2022-05-03 NOTE — Progress Notes (Signed)
[facility] ADULT ICU REPLACEMENT PROTOCOL   The patient does apply for the [facility] Adult ICU Electrolyte Replacment Protocol based on the criteria listed below:   1.Exclusion criteria: TCTS patients, ECMO patients, and Dialysis patients 2. Is GFR >/= 30 ml/min? Yes.    Patient's GFR today is >60 3. Is SCr </= 2? Yes.   Patient's SCr is 0.86 mg/dL 4. Did SCr increase >/= 0.5 in 24 hours? No. 5.Pt's weight >40kg  No. 6. Abnormal electrolyte(s): K+ 3.4  7. Electrolytes replaced per protocol 8.  Call MD STAT for K+ </= 2.5, Phos </= 1, or Mag </= 1 Physician:  Dr. Sande Brothers, Janace Hoard 05/03/2022 6:11 AM

## 2022-05-03 NOTE — Evaluation (Addendum)
Clinical/Bedside Swallow Evaluation Patient Details  Name: Yolanda [name] MRN: [id number] Date of Birth: 1955/08/09  Today's Date: 05/03/2022 Time: SLP Start Time (ACUTE ONLY): 9937 SLP Stop Time (ACUTE ONLY): 1696 SLP Time Calculation (min) (ACUTE ONLY): 15 min  Past Medical History:  Past Medical History:  Diagnosis Date   Abnormal uterine bleeding    Anxiety    Arthritis    Chronic abdominal pain    Chronic pain in left foot    Depression    Elevated liver function tests 2018   Endometriosis    Fibromyalgia    GERD 12/21/2009   Qualifier: Diagnosis of  By: Craige Cotta     Hypertension    stopped meds in Aug 2015   Hypothyroidism    IBS (irritable bowel syndrome)    Internal hemorrhoids    Kidney stone    Liver cirrhosis secondary to NASH (HCC)    Obesity, morbid (HCC)    PONV (postoperative nausea and vomiting)    Psoriatic arthritis ([place])    Pulmonary embolism ([place]) 12/2005   Qualifier: Diagnosis of  By: Kellie Simmering LPN, Almyra Free     Rheumatoid arthritis ([place])    Sleep apnea    Vitamin D deficiency    Past Surgical History:  Past Surgical History:  Procedure Laterality Date   ABDOMINAL SURGERY     laparoscopy   BIOPSY N/A 03/24/2014   Procedure: GASTRIC BIOPSIES;  Surgeon: Danie Binder, MD;  Location: [facility];  Service: Endoscopy;  Laterality: N/A;   BIOPSY  10/28/2019   Procedure: BIOPSY;  Surgeon: Danie Binder, MD;  [facility];  Service: Endoscopy;;  gastric nodule   BREAST LUMPECTOMY Right    CESAREAN SECTION     X2   COLONOSCOPY  2006   internal hemorrhoids   COLONOSCOPY WITH PROPOFOL N/A 03/24/2014   Dr. [name]: 2 tubular adenomas removed, hemorrhoids   COLONOSCOPY WITH PROPOFOL N/A 10/28/2019   Fields: External and internal hemorrhoids, 8 polyps ranging from 2 to 5 mm in size removed from the colon.  Multiple tubular adenomas.  Next colonoscopy in 3 years.   CYSTOSCOPY W/ RETROGRADES  01/23/2012   Procedure: CYSTOSCOPY WITH RETROGRADE  PYELOGRAM;  Surgeon: Marissa Nestle, MD;  Location: [facility];  Service: Urology;  Laterality: Left;   DILATATION & CURETTAGE/HYSTEROSCOPY WITH MYOSURE N/A 11/18/2014   Procedure: DILATATION & CURETTAGE/HYSTEROSCOPY WITH MYOSURE, resection of polyp;  Surgeon: Cheri Fowler, MD;  Location: [facility];  Service: Gynecology;  Laterality: N/A;   DILATION AND CURETTAGE OF UTERUS     ESOPHAGOGASTRODUODENOSCOPY   08/24/2006   Dr. Veto Kemps erythema of the antrum without erosion or ulcers/Otherwise, normal esophagus without evidence of Barrett's path with chronic gastritis   ESOPHAGOGASTRODUODENOSCOPY (EGD) WITH PROPOFOL N/A 03/24/2014   Dr. [name]: gastritis   ESOPHAGOGASTRODUODENOSCOPY (EGD) WITH PROPOFOL N/A 10/28/2019   Fields: Esophagus appeared normal, empiric dilation due to history of dysphagia.  6 mm nodule seen in the gastric cardia.  Mild portal hypertensive gastropathy.  Nodule from the stomach biopsied and showed mild chronic gastritis, no H. pylori.   POLYPECTOMY N/A 03/24/2014   Procedure: POLYPECTOMY;  Surgeon: Danie Binder, MD;  Location: [facility];  Service: Endoscopy;  Laterality: N/A;   POLYPECTOMY  10/28/2019   Procedure: POLYPECTOMY;  Surgeon: Danie Binder, MD;  [facility];  Service: Endoscopy;;  hepatic flexure, ascending colon,sigmoid colon, rectal   REMOVAL OF STONES  01/23/2012   Procedure: REMOVAL OF STONES;  Surgeon: Marissa Nestle,  MD;  Location: [facility];  Service: Urology;  Laterality: N/A;   SAVORY DILATION N/A 10/28/2019   Procedure: SAVORY DILATION;  Surgeon: Danie Binder, MD;  [facility];  Service: Endoscopy;  Laterality: N/A;   TUBAL LIGATION     HPI:  Pt is a 66 y.o. female who presented with SOB and fatigue. CXR on admission revealed multifocal pneumonia. ETT 10/22-10/25. Family reported to ordering MD that pt has a fairly significant history of poorly controlled reflux, choking after meals, and at times choking waking up at night. Plan  for esopghagram when pt is medically stable. Pt recently admitted at [facility] 10/11-10/19 with AMS and acute cystitis. Pt found to have new onset Afib, multi-focal PNA and effusion and was diuresed and discharged home. PMH: cirrhosis secondary to NASH with recent hepatic encephalopathy, PAF not on anticoagulation, chronic HFpEF, recent multifocal pneumonia, hypothyroidism, GERD, morbid obesity, COPD Gold stage II, esophagram 04/13/17 WNL. EGD 10/28/19: Esophagus appeared normal, empiric dilation due to history of dysphagia.  6 mm nodule seen in the gastric cardia.  Mild portal hypertensive gastropathy.  Nodule from the stomach biopsied and showed mild chronic gastritis, no H. pylori.    Assessment / Plan / Recommendation  Clinical Impression  Pt was seen for bedside swallow evaluation with her daughter present. Pt was pleasant, but processing speed was reduced and repetition intermittently necessary for pt to follow commands. Pt and her daughter reported that the pt coughs when she eats and that she often wakes up at night gasping for breath. Oral mechanism exam was [facility] and dentition was reduced, but adequate for mastication. Pt tolerated all solids and liquids without overt s/s of aspiration. Mastication was prolonged and pt required a liquid wash to facilitate oral clearance. A full liquid diet will be initiated at this time. Approximately 15 minutes after completion of the evaluation, pt exhibited significant coughing and reported c/o abdominal/chest pain. SLP suspects that pt's symptoms may be esophageal, but considering family's reports, will complete a modified barium swallow study to rule out pharyngeal dysfunction. SLP Visit Diagnosis: Dysphagia, unspecified (R13.10)    Aspiration Risk  Mild aspiration risk    Diet Recommendation Thin liquid (full liquids)   Liquid Administration via: Cup;Straw Medication Administration: Crushed with puree Supervision: Staff to assist with self feeding Compensations:  Slow rate;Small sips/bites;Follow solids with liquid Postural Changes: Seated upright at 90 degrees;Remain upright for at least 30 minutes after po intake    Other  Recommendations Oral Care Recommendations: Oral care BID    Recommendations for follow up therapy are one component of a multi-disciplinary discharge planning process, led by the attending physician.  Recommendations may be updated based on patient status, additional functional criteria and insurance authorization.  Follow up Recommendations  (TBD)      Assistance Recommended at Discharge    Functional Status Assessment Patient has had a recent decline in their functional status and demonstrates the ability to make significant improvements in function in a reasonable and predictable amount of time.  Frequency and Duration min 2x/week  2 weeks       Prognosis Barriers to Reach Goals: Time post onset;Severity of deficits      Swallow Study   General Date of Onset: 03/03/22 HPI: Pt is a 67 y.o. female who presented with SOB and fatigue. CXR on admission revealed multifocal pneumonia. ETT 10/22-10/25. Family reported to ordering MD that pt has a fairly significant history of poorly controlled reflux, choking after meals, and at times choking  waking up at night. Plan for esopghagram when pt is medically stable. [facility] at [facility]10[facility] with AMS and acute cystitis. Pt found to have new onset Afib, multi-focal PNA and effusion and was diuresed and discharged home. PMH: cirrhosis secondary to NASH with recent hepatic encephalopathy, PAF not on anticoagulation, chronic HFpEF, recent multifocal pneumonia, hypothyroidism, GERD, morbid obesity, COPD Gold stage II, esophagram 04/13/17 WNL. EGD 10/28/19: Esophagus appeared normal, empiric dilation due to history of dysphagia.  6 mm nodule seen in the gastric cardia.  Mild portal hypertensive gastropathy.  Nodule from the stomach biopsied and showed mild chronic gastritis, no H.  pylori. Type of Study: Bedside Swallow Evaluation Diet Prior to this Study: NPO Temperature Spikes Noted: No Respiratory Status: Nasal cannula History of Recent Intubation: Yes Length of Intubations (days): 3 days Date extubated: 05/03/22 Behavior/Cognition: Alert;Cooperative;Pleasant mood Oral Cavity Assessment: Within Functional Limits Oral Care Completed by SLP: No Vision: Functional for self-feeding Self-Feeding Abilities: Able to feed self Patient Positioning: Upright in bed;Postural control adequate for testing Baseline Vocal Quality: Hoarse Volitional Swallow: Able to elicit    Oral/Motor/Sensory Function Overall Oral Motor/Sensory Function: Within functional limits   Ice Chips Ice chips: Not tested   Thin Liquid Thin Liquid: Within functional limits Presentation: Straw    Nectar Thick Nectar Thick Liquid: Not tested   Honey Thick Honey Thick Liquid: Not tested   Puree Puree: Within functional limits Presentation: Spoon   Solid     Solid: Impaired Oral Phase Impairments: Impaired mastication Oral Phase Functional Implications: Impaired mastication     Denaly Gatling I. Hardin Negus, [place], [place] Office number [phone]  Horton Marshall 05/03/2022,4:08 PM

## 2022-05-03 NOTE — Progress Notes (Addendum)
eLink Physician-Brief Progress Note Patient Name: CORDIE BEAZLEY DOB: 09/27/1955 MRN: [id number]   Date of Service  05/03/2022  HPI/Events of Note  AFIB with RVR - Ventricular rate = 211. Given Adenosine 6 mg IV with transient slowing of ventricular rate to reveal AFIB. BP = 91/81.  eICU Interventions  Plan: Amiodarone IV load and infusion. Heparin IV infusion per pharmacy consultation.  BMP and Mg++ level STAT. Cycle Troponin. 12 Lead EKG when HR slows.     Intervention Category Major Interventions: Arrhythmia - evaluation and management  Tylerjames Hoglund [place] 05/03/2022, 10:09 PM

## 2022-05-04 ENCOUNTER — Other Ambulatory Visit (HOSPITAL_COMMUNITY): Payer: Self-pay

## 2022-05-04 ENCOUNTER — Inpatient Hospital Stay (HOSPITAL_COMMUNITY): Payer: Medicare Other

## 2022-05-04 DIAGNOSIS — I5033 Acute on chronic diastolic (congestive) heart failure: Secondary | ICD-10-CM | POA: Diagnosis not present

## 2022-05-04 LAB — BASIC METABOLIC PANEL
Anion gap: 9 (ref 5–15)
Anion gap: 11 (ref 5–15)
BUN: 32 mg/dL — ABNORMAL HIGH (ref 8–23)
BUN: 36 mg/dL — ABNORMAL HIGH (ref 8–23)
CO2: 35 mmol/L — ABNORMAL HIGH (ref 22–32)
CO2: 34 mmol/L — ABNORMAL HIGH (ref 22–32)
Calcium: 10.4 mg/dL — ABNORMAL HIGH (ref 8.9–10.3)
Calcium: 10.3 mg/dL (ref 8.9–10.3)
Chloride: 97 mmol/L — ABNORMAL LOW (ref 98–111)
Chloride: 96 mmol/L — ABNORMAL LOW (ref 98–111)
Creatinine, Ser: 0.89 mg/dL (ref 0.44–1.00)
Creatinine, Ser: 1.14 mg/dL — ABNORMAL HIGH (ref 0.44–1.00)
GFR, Estimated: >60 mL/min (ref >60)
GFR, Estimated: 53 mL/min — ABNORMAL LOW (ref >60)
Glucose, Bld: 103 mg/dL — ABNORMAL HIGH (ref 70–99)
Glucose, Bld: 145 mg/dL — ABNORMAL HIGH (ref 70–99)
Potassium: 3.1 mmol/L — ABNORMAL LOW (ref 3.5–5.1)
Potassium: 4.9 mmol/L (ref 3.5–5.1)
Sodium: 141 mmol/L (ref 135–145)
Sodium: 141 mmol/L (ref 135–145)

## 2022-05-04 LAB — GLUCOSE, CAPILLARY
Glucose-Capillary: 94 mg/dL (ref 70–99)
Glucose-Capillary: 106 mg/dL — ABNORMAL HIGH (ref 70–99)
Glucose-Capillary: 110 mg/dL — ABNORMAL HIGH (ref 70–99)
Glucose-Capillary: 117 mg/dL — ABNORMAL HIGH (ref 70–99)
Glucose-Capillary: 111 mg/dL — ABNORMAL HIGH (ref 70–99)

## 2022-05-04 LAB — CBC
HCT: 39.5 % (ref 36.0–46.0)
Hemoglobin: 12.5 g/dL (ref 12.0–15.0)
MCH: 32.1 pg (ref 26.0–34.0)
MCHC: 31.6 g/dL (ref 30.0–36.0)
MCV: 101.5 fL — ABNORMAL HIGH (ref 80.0–100.0)
Platelets: 206 10*3/uL (ref 150–400)
RBC: 3.89 MIL/uL (ref 3.87–5.11)
RDW: 17.5 % — ABNORMAL HIGH (ref 11.5–15.5)
WBC: 16.3 10*3/uL — ABNORMAL HIGH (ref 4.0–10.5)
nRBC: 0 % (ref 0.0–0.2)

## 2022-05-04 LAB — MAGNESIUM: Magnesium: 1.8 mg/dL (ref 1.7–2.4)

## 2022-05-04 LAB — TROPONIN I (HIGH SENSITIVITY): Troponin I (High Sensitivity): 17 ng/L (ref <18)

## 2022-05-04 LAB — PROTIME-INR
INR: 1.8 — ABNORMAL HIGH (ref 0.8–1.2)
Prothrombin Time: 20.8 seconds — ABNORMAL HIGH (ref 11.4–15.2)

## 2022-05-04 LAB — HEPARIN LEVEL (UNFRACTIONATED): Heparin Unfractionated: 0.31 IU/mL (ref 0.30–0.70)

## 2022-05-04 MED ORDER — METOCLOPRAMIDE HCL 5 MG/ML IJ SOLN
10.0000 mg | Freq: Once | INTRAMUSCULAR | Status: AC
  Administered 2022-05-04: 10 mg via INTRAVENOUS
  Filled 2022-05-04: qty 2

## 2022-05-04 MED ORDER — SUCRALFATE 1 GM/10ML PO SUSP
1.0000 g | Freq: Three times a day (TID) | ORAL | Status: DC
  Administered 2022-05-04 (×2): 1 g via ORAL
  Filled 2022-05-04 (×4): qty 10

## 2022-05-04 MED ORDER — SPIRONOLACTONE 25 MG PO TABS
50.0000 mg | ORAL_TABLET | Freq: Every day | ORAL | Status: DC
  Administered 2022-05-05 – 2022-05-11 (×7): 50 mg via ORAL
  Filled 2022-05-04 (×7): qty 2

## 2022-05-04 MED ORDER — AMIODARONE IV BOLUS ONLY 150 MG/100ML: 150.0000 mg | Freq: Once | INTRAVENOUS | Status: DC

## 2022-05-04 MED ORDER — ASPIRIN 81 MG PO CHEW: 81.0000 mg | CHEWABLE_TABLET | Freq: Every day | ORAL | Status: DC

## 2022-05-04 MED ORDER — AMIODARONE LOAD VIA INFUSION
150.0000 mg | Freq: Once | INTRAVENOUS | Status: AC
  Administered 2022-05-04: 150 mg via INTRAVENOUS
  Filled 2022-05-04: qty 83.34

## 2022-05-04 MED ORDER — POTASSIUM CHLORIDE CRYS ER 20 MEQ PO TBCR
40.0000 meq | EXTENDED_RELEASE_TABLET | ORAL | Status: DC
  Administered 2022-05-04: 40 meq via ORAL
  Filled 2022-05-04 (×2): qty 2

## 2022-05-04 MED ORDER — PILOCARPINE HCL 5 MG PO TABS
5.0000 mg | ORAL_TABLET | Freq: Two times a day (BID) | ORAL | Status: DC
  Administered 2022-05-04 – 2022-05-05 (×3): 5 mg via ORAL
  Filled 2022-05-04 (×4): qty 1

## 2022-05-04 MED ORDER — PANTOPRAZOLE 2 MG/ML SUSPENSION
40.0000 mg | Freq: Two times a day (BID) | ORAL | Status: DC
  Administered 2022-05-04 – 2022-05-05 (×3): 40 mg via ORAL
  Filled 2022-05-04 (×3): qty 20

## 2022-05-04 MED ORDER — DULOXETINE HCL 60 MG PO CPEP
60.0000 mg | ORAL_CAPSULE | Freq: Every day | ORAL | Status: DC
  Administered 2022-05-04 – 2022-05-19 (×16): 60 mg via ORAL
  Filled 2022-05-04 (×17): qty 1

## 2022-05-04 MED ORDER — MAGNESIUM SULFATE 2 GM/50ML IV SOLN
2.0000 g | Freq: Once | INTRAVENOUS | Status: AC
  Administered 2022-05-04: 2 g via INTRAVENOUS
  Filled 2022-05-04: qty 50

## 2022-05-04 MED ORDER — AMIODARONE IV BOLUS ONLY 150 MG/100ML
150.0000 mg | Freq: Once | INTRAVENOUS | Status: AC
  Administered 2022-05-04: 150 mg via INTRAVENOUS

## 2022-05-04 MED ORDER — LACTULOSE 10 GM/15ML PO SOLN
20.0000 g | Freq: Two times a day (BID) | ORAL | Status: DC
  Filled 2022-05-04: qty 30

## 2022-05-04 MED ORDER — ADULT MULTIVITAMIN W/MINERALS CH
1.0000 | ORAL_TABLET | Freq: Every day | ORAL | Status: DC
  Administered 2022-05-05: 1 via ORAL
  Filled 2022-05-04 (×2): qty 1

## 2022-05-04 MED ORDER — METOPROLOL TARTRATE 25 MG PO TABS
25.0000 mg | ORAL_TABLET | Freq: Two times a day (BID) | ORAL | Status: DC
  Administered 2022-05-04 – 2022-05-14 (×20): 25 mg via ORAL
  Filled 2022-05-04 (×21): qty 1

## 2022-05-04 MED ORDER — FOLIC ACID 1 MG PO TABS
1.0000 mg | ORAL_TABLET | Freq: Every day | ORAL | Status: DC
  Administered 2022-05-05: 1 mg via ORAL
  Filled 2022-05-04 (×2): qty 1

## 2022-05-04 MED ORDER — PREDNISONE 20 MG PO TABS: 40.0000 mg | ORAL_TABLET | Freq: Every day | ORAL | Status: DC

## 2022-05-04 MED ORDER — APIXABAN 5 MG PO TABS
5.0000 mg | ORAL_TABLET | Freq: Two times a day (BID) | ORAL | Status: DC
  Administered 2022-05-04 – 2022-05-19 (×31): 5 mg via ORAL
  Filled 2022-05-04 (×31): qty 1

## 2022-05-04 MED ORDER — LACTULOSE 10 GM/15ML PO SOLN
20.0000 g | Freq: Two times a day (BID) | ORAL | Status: DC
  Administered 2022-05-04 – 2022-05-05 (×3): 20 g via ORAL
  Filled 2022-05-04 (×5): qty 30

## 2022-05-04 MED ORDER — RIFAXIMIN 550 MG PO TABS
550.0000 mg | ORAL_TABLET | Freq: Two times a day (BID) | ORAL | Status: DC
  Administered 2022-05-04 – 2022-05-16 (×24): 550 mg via ORAL
  Filled 2022-05-04 (×26): qty 1

## 2022-05-04 MED ORDER — POTASSIUM CHLORIDE 10 MEQ/50ML IV SOLN
10.0000 meq | INTRAVENOUS | Status: AC
  Administered 2022-05-04 (×6): 10 meq via INTRAVENOUS
  Filled 2022-05-04 (×5): qty 50

## 2022-05-04 MED ORDER — ROSUVASTATIN CALCIUM 5 MG PO TABS
10.0000 mg | ORAL_TABLET | Freq: Every day | ORAL | Status: DC
  Administered 2022-05-05 – 2022-05-19 (×15): 10 mg via ORAL
  Filled 2022-05-04 (×15): qty 2

## 2022-05-04 MED ORDER — LEVOTHYROXINE SODIUM 75 MCG PO TABS
75.0000 ug | ORAL_TABLET | Freq: Every day | ORAL | Status: DC
  Administered 2022-05-05 – 2022-05-19 (×15): 75 ug via ORAL
  Filled 2022-05-04 (×15): qty 1

## 2022-05-04 NOTE — TOC Initial Note (Addendum)
Transition of Care [facility]) - Initial/Assessment Note    Patient Details  Name: Yolanda [name] MRN: [id number] Date of Birth: 07-24-1955  Transition of Care [facility]) CM/SW Contact:    Bethena Roys, RN Phone Number: 05/04/2022, 4:27 PM  Clinical Narrative:  Risk for readmission assessment completed. PTA patient was from home with her daughter Lenna Sciara. Case Manager spoke with [name] and she states that her mom will return home with her. Patient has DME oxygen via [employer], rolling walker, and bedside commode. Daughter states patient will benefit from a hospital bed once stable to transition home. Patient was active with [facility]: RN, PT,OT, CSW, and Aide. Patient will need resumption orders once stable. Patient will benefit from PT/OT recommendations. Case Manager received a consult to check on Rx drug coverage. Case Manager did call [phone] and [phone] regarding [employer]. Patient's insurance was cancelled on March 09, 2022 due to non-payment. [employer] states this will be new open enrollment for the patient. Case Manager asked for re-instatement- representative unable to assist. [name] is aware and of the above information. Patient uses Product/process development scientist [address] in [place] has lapsed- patient has been using [facility]. Daughter [name] states the patient is active with outpatient palliative- she is confirming the agency. Case Manager will continue to follow for additional transition of care needs.              Expected Discharge Plan: [place] Barriers to Discharge: Continued Medical Work up   Patient Goals and CMS Choice Patient states their goals for this hospitalization and ongoing recovery are:: patient will return to daughters home [name])      Expected Discharge Plan and Services Expected Discharge Plan: [place] In-house Referral: Clinical Social Work Discharge Planning Services: CM Consult Post Acute Care  Choice: Home Health, Resumption of Svcs/PTA Provider Living arrangements for the past 2 months: Single Family Home                   DME Agency: NA       HH Arranged: RN, Disease Management, Nurse's Aide, Social Work [employer] Agency: [facility] ([place]) Date [place]: 05/04/22 Time [place]: 1626 Representative spoke with at [place]: [place] Arrangements/Services Living arrangements for the past 2 months: [place] Lives with:: Adult Children Patient language and need for interpreter reviewed:: Yes Do you feel safe going back to the place where you live?: Yes      Need for Family Participation in Patient Care: Yes (Comment)   Current home services: DME (oxygen with [employer], rolling walker and bedside commode) Criminal Activity/Legal Involvement Pertinent to Current Situation/Hospitalization: No - Comment as needed  Permission Sought/Granted Permission sought to share information with : Case Manager, Family Supports, Customer service manager Permission granted to share information with : Yes, Verbal Permission Granted     Permission granted to share info w AGENCY: [facility]    Emotional Assessment Appearance:: Appears stated age       Alcohol / Substance Use: Not Applicable Psych Involvement: No (comment)  Admission diagnosis:  CHF (congestive heart failure) ([place]) [I50.9] Dyspnea, unspecified type [R06.00] Patient Active Problem List   Diagnosis Date Noted   Pulmonary edema 05/03/2022   Acute respiratory failure with hypoxia ([place]) 04/30/2022   Hyponatremia 04/29/2022   CHF (congestive heart failure) ([place]) 04/28/2022   Paroxysmal atrial fibrillation with RVR ([place]) 04/25/2022   Ileus ([place]) 04/25/2022   Malnutrition  of moderate degree 04/24/2022   Acute on chronic diastolic CHF (congestive heart failure) ([place]) 24/46/9507   Acute metabolic encephalopathy 22/57/5051   Urinary urgency 12/29/2021   Seizure ([place])  12/07/2021   Generalized weakness 12/07/2021   Hypoalbuminemia due to protein-calorie malnutrition ([place]) 12/07/2021   Hyperammonemia /Hepatic encephalopathy 12/07/2021   UTI (urinary tract infection) 12/07/2021   Elevated MCV 12/07/2021   Essential tremor 12/07/2021   Psoriatic arthritis ([place]) 12/07/2021   Asthma, chronic 12/07/2021   Acute hepatic encephalopathy/nNASH Liver Cirrhosis 12/07/2021   Chronic respiratory failure with hypoxia ([place]) 11/27/2021   Pulmonary infiltrates 11/27/2021   Upper airway cough syndrome vs cough variant asthma 11/27/2021   Class 3 obesity ([place]) 11/02/2021   Pulmonary alveolar hemorrhage 09/20/2021   Multifocal pneumonia 09/20/2021   Hyperglycemia 09/20/2021   Oral candida 01/30/2020   Gastric nodule    Personal history of colonic polyps 01/01/2019   Liver cirrhosis secondary to NASH ([place]) 01/22/2018   Dysphagia 04/19/2017   Abdominal pain, epigastric 04/19/2017   Abdominal pain 12/08/2014   Postmenopausal bleeding 11/18/2014   Closed fracture of distal clavicle 04/17/2014   Muscle weakness (generalized) 03/26/2014   Pain in joint, shoulder region 03/26/2014   Decreased range of motion of right shoulder 03/26/2014   Right clavicle fracture 03/19/2014   Fatty liver 03/17/2014   Rectal bleeding 03/17/2014   Dyspepsia 03/11/2014   Obesity, Class III, BMI 40-49.9 (morbid obesity) ([place]) 09/11/2012   Hypothyroidism 09/11/2012   ADHD (attention deficit hyperactivity disorder) 09/11/2012   Anxiety 09/11/2012   GERD (gastroesophageal reflux disease) 12/21/2009   IRRITABLE BOWEL SYNDROME 04/22/2009   Type 2 diabetes mellitus with hyperlipidemia ([place]) 07/09/2008   Anxiety and depression 07/09/2008   Essential hypertension 07/09/2008   Gastritis and gastroduodenitis 07/09/2008   Sleep apnea 07/09/2008   PCP:  Celene Squibb, MD Pharmacy:   [place], [place] - [place] [address] [place] [address] [place] [address] Phone:  [phone] Fax: [phone]  Readmission Risk Interventions    05/04/2022    4:24 PM 04/25/2022    1:27 PM 09/22/2021   11:13 AM  Readmission Risk Prevention Plan  Post Dischage Appt   Complete  Medication Screening   Complete  Transportation Screening Complete Complete Complete  HRI or Home Care Consult  Complete   Social Work Consult for [place] Planning/Counseling  Complete   Palliative Care Screening  Not Applicable   Medication Review Press photographer) Complete Complete   HRI or [place] Complete    SW Recovery Care/Counseling Consult Complete    Palliative Care Screening Not [place] Not Applicable

## 2022-05-04 NOTE — Progress Notes (Signed)
[facility] Progress Note Patient Name: Yolanda [name] DOB: 08/08/1955 MRN: [id number]   Date of Service  05/04/2022  HPI/Events of Note  AFIB with RVR - Ventricular rate = 180-200. BP = 134/89. K+ = 3.1 and Mg++ = 1.8. K+ and Mg++ already being replaced by PCCM rounding team.   eICU Interventions  Plan: Amiodarone 150 mg IV over 10 minutes now.     Intervention Category Major Interventions: Arrhythmia - evaluation and management  Adison Reifsteck [place] 05/04/2022, 6:02 AM

## 2022-05-04 NOTE — Progress Notes (Signed)
   05/04/22 2155  Vitals  BP (!) 121/107  MAP (mmHg) 112  Pulse Rate (!) 200  ECG Heart Rate (!) 203  Resp 16  Oxygen Therapy  SpO2 96 %  MEWS Score  MEWS Temp 0  MEWS Systolic 0  MEWS Pulse 3  MEWS RR 0  MEWS LOC 2  MEWS Score 5  MEWS Score Color Red   Pt in Afib/RVR, awake and verbal. Contacted Dr. Oletta Darter, orders received.

## 2022-05-04 NOTE — Evaluation (Signed)
Physical Therapy Evaluation Patient Details Name: Yolanda [name] MRN: [id number] DOB: 1956-01-29 Today's Date: 05/04/2022  History of Present Illness  Pt is 66 y/o F admitted to [facility] on 04/28/22 wih SOB, hypoxia, acute on chronic heart failure, Afib with RVR. Worsening hypoxic respiratory failure 10/22 with transfer to ICU; ETT 10/22-10/25. Of note, recent hosptial admission 10/11-10/19 with acute encephalopathy. PMH of anxiety, depression, fibroylagia, GERD, HTN, hypothyroidism, obesity, RA, PE, liver cirrhosis.   Clinical Impression  Pt presents with an overall decrease in functional mobility secondary to above. Prior to initial admission on 04/19/22, pt was household ambulator with rollator, requiring assistance from children for IADLs. Today, pt able to sit EOB fluctuating from min guard-maxA with onset of fatigue and fluctuating alertness. Pt required frequent verbal cues to keep eyes open for improved engagement during session. HR 96-110 bpm during mobility . Pt would benefit from continued acute PT services to maximize functional mobility and independence prior to d/c to AIR level therapies (pending activity tolerance).     Recommendations for follow up therapy are one component of a multi-disciplinary discharge planning process, led by the attending physician.  Recommendations may be updated based on patient status, additional functional criteria and insurance authorization.  Follow Up Recommendations Acute inpatient rehab (3hours/day) (pending activity tolerance) Can patient physically be transported by private vehicle: No    Assistance Recommended at Discharge Frequent or constant Supervision/Assistance  Patient can return home with the following  Help with stairs or ramp for entrance;Assistance with cooking/housework;A lot of help with walking and/or transfers;A lot of help with bathing/dressing/bathroom;Assist for transportation    Equipment Recommendations Other (comment)  (TBD)  Recommendations for Other Services       Functional Status Assessment Patient has had a recent decline in their functional status and demonstrates the ability to make significant improvements in function in a reasonable and predictable amount of time.     Precautions / Restrictions Precautions Precautions: Fall Restrictions Weight Bearing Restrictions: No      Mobility  Bed Mobility Overal bed mobility: Needs Assistance Bed Mobility: Supine to Sit, Sit to Supine     Supine to sit: Max assist, +2 for physical assistance Sit to supine: Max assist, +2 for physical assistance   General bed mobility comments: Pt demonstrated minimal core activation, required maxA+2 due to poor trunk control. Trunk control improved throughout session, although ultimately needed maxA+2 back to supine after fatigue set in. AROM of B LEs sitting EOB.    Transfers                        Ambulation/Gait                  Stairs            Wheelchair Mobility    Modified Rankin (Stroke Patients Only)       Balance Overall balance assessment: Needs assistance Sitting-balance support: Bilateral upper extremity supported, Feet unsupported Sitting balance-Leahy Scale: Poor Sitting balance - Comments: Static sitting min guard-maxA throughout session, pt able to self correct around 50% of the time when drifting side to side with prolonged sitting. Dynamic sitting balance demonstarted with R UE fwd reaching inside BOS.                                     Pertinent Vitals/Pain Pain Assessment Pain Assessment: Faces Faces Pain Scale: Hurts  even more Pain Location: R leg Pain Descriptors / Indicators: Grimacing, Moaning Pain Intervention(s): Monitored during session, Repositioned    Home Living Family/patient expects to be discharged to:: Private residence Living Arrangements: Children;Other relatives Available Help at Discharge: Family;Available 24  hours/day Type of Home: House Home Access: Stairs to enter Entrance Stairs-Rails: None Entrance Stairs-Number of Steps: 2 Alternate Level Stairs-Number of Steps: 12 to 15 Home Layout: Two level;Laundry or work area in basement;Able to live on main level with bedroom/bathroom Home Equipment: [place] (4 wheels);Cane - single point;Shower seat;Grab bars - tub/shower Additional Comments: Information above is from most recent admission. Pt did verbalize during current stay evaluation that lives in a home with children and does have stairs (did not clarify amount)    Prior Function Prior Level of Function : Needs assist       Physical Assist : Mobility (physical);ADLs (physical) Mobility (physical): Gait;Transfers;Stairs ADLs (physical): IADLs;Dressing Mobility Comments: Pt confirmed does use rollator, unable to confirm due to no family present at this tome ADLs Comments: Assited to don socks PRN; uses sock aide as well. Independent for other ADL's per last admission     Hand Dominance        Extremity/Trunk Assessment   Upper Extremity Assessment Upper Extremity Assessment: Generalized weakness (utilized L UE to support self sitting EOB and R UE forward reaching)    Lower Extremity Assessment Lower Extremity Assessment: Generalized weakness (LE activation greater in sitting than supine)    Cervical / Trunk Assessment Cervical / Trunk Assessment: Normal  Communication   Communication: Other (comment) (increased time necessary for processing)  Cognition Arousal/Alertness: Awake/alert, Lethargic Behavior During Therapy: WFL for tasks assessed/performed Overall Cognitive Status: No family/caregiver present to determine baseline cognitive functioning                                 General Comments: Pt responded appropriately with increased time and one step commands. Required frequent cuing to keep eyes open during session. Pt appeared to bounce in and out of  awareness during session, although unable to truly determine due to not formally assesed. AO to person, place, year        General Comments General comments (skin integrity, edema, etc.): VSS during session, HR 96-110 bpm    Exercises     Assessment/Plan    PT Assessment Patient needs continued PT services  PT Problem List Decreased strength;Decreased activity tolerance;Decreased balance;Decreased mobility       PT Treatment Interventions DME instruction;Gait training;Stair training;Functional mobility training;Therapeutic activities;Therapeutic exercise;Patient/family education;Balance training    PT Goals (Current goals can be found in the Care Plan section)  Acute Rehab PT Goals Patient Stated Goal: no goal stated PT Goal Formulation: With patient Time For Goal Achievement: 05/18/22 Potential to Achieve Goals: Fair    Frequency Min 3X/week     Co-evaluation               AM-PAC PT "6 Clicks" Mobility  Outcome Measure Help needed turning from your back to your side while in a flat bed without using bedrails?: A Lot Help needed moving from lying on your back to sitting on the side of a flat bed without using bedrails?: Total Help needed moving to and from a bed to a chair (including a wheelchair)?: Total Help needed standing up from a chair using your arms (e.g., wheelchair or bedside chair)?: Total Help needed to walk in hospital room?: Total Help  needed climbing 3-5 steps with a railing? : Total 6 Click Score: 7    End of Session   Activity Tolerance: Patient limited by fatigue Patient left: with call bell/phone within reach;in bed;with bed alarm set Nurse Communication: Mobility status PT Visit Diagnosis: Muscle weakness (generalized) (M62.81);Other abnormalities of gait and mobility (R26.89)    Time: 2060-1561 PT Time Calculation (min) (ACUTE ONLY): 28 min   Charges:   PT Evaluation $PT Eval Moderate Complexity: 1 Mod     [address] [place],  SPT   [place] Tenicia Gural 05/04/2022, 1:49 PM

## 2022-05-04 NOTE — Progress Notes (Signed)
Pt in SVT at 2201,  asymptomatic, coughed and converted to NSR  2205 Pt with HR in low 208Y BP systolic 22'V, Dr. Oletta Darter contacted via camera. 2207 Adenosine given 2209 Amiodarone bolus given and gtt started.  2243 converted to NSR.

## 2022-05-04 NOTE — Progress Notes (Signed)
Modified Barium Swallow Progress Note  Patient Details  Name: Yolanda [name] MRN: [id number] Date of Birth: 08/27/1955  Today's Date: 05/04/2022  Modified Barium Swallow completed.  Full report located under Chart Review in the Imaging Section.  Brief recommendations include the following:  Clinical Impression  Pt's oropharyngeal swallow mechanism was within functional limits with minimal vallecular and pyriform sinus residue which was cleared with a liquid wash or with pt's independent use of secondary swallows. An episode of regurgitation was noted when pt swallowed the 61m barium tablet with thin liquids . Aspiration was not observed during this episode, but based on reports from the pt and her daughter, and the pt's pneumonia, SLP questions whether these episodes intermittently result in aspiration. Esophageal visualization revealed slowed movement of solids in the upper thoracic esophagus and halted movement of the barium tablet at the GE junction which was facilitated with an additional bolus of thin liquids. SLP is in agreement with esophageal assessment. Pt may have up to regular texture solids and thin liquids from an oropharyngeal standpoint; pt's case discussed with PCCM (i.e., Dr. STamala Julianand PSalvadore Dom NP) and it was agreed to advance pt's diet. SLP will follow briefly to ensure tolerance of the advanced diet.    Swallow Evaluation Recommendations   Recommended Consults: Consider esophageal assessment   SLP Diet Recommendations:  Regular texture solids, thin liquids   Liquid Administration via: Cup;Straw   Medication Administration: Whole meds with puree   Supervision: Staff to assist with self feeding   Compensations: Slow rate;Small sips/bites   Postural Changes: Seated upright at 90 degrees;Remain semi-upright after after feeds/meals (Comment)   Oral Care Recommendations: Oral care BID      Reisa Coppola I. PHardin Negus MCathedral CHialeahOffice number [phone] SHorton Marshall10/26/2023,4:02 PM

## 2022-05-04 NOTE — Progress Notes (Signed)
   05/04/22 2241  Vitals  Pulse Rate (!) 176  ECG Heart Rate (!) 174  Resp 18  Oxygen Therapy  SpO2 95 %  MEWS Score  MEWS Temp 0  MEWS Systolic 0  MEWS Pulse 3  MEWS RR 0  MEWS LOC 2  MEWS Score 5  MEWS Score Color Red   Continues in Afib/RVR. Spoke with Dr. Emmit Alexanders, new orders received.

## 2022-05-04 NOTE — Progress Notes (Signed)
   Inpatient Rehab Admissions Coordinator :  Per therapy recommendations patient was screened for CIR candidacy by Danne Baxter RN MSN. Patient is not yet at a level to tolerate the intensity required to pursue a CIR admit . Patient may have the potential to progress to become a candidate. The CIR admissions team will follow and monitor for progress and place a Rehab Consult order if felt to be appropriate. Please contact me with any questions.  Danne Baxter RN MSN Admissions Coordinator [phone]

## 2022-05-04 NOTE — Progress Notes (Addendum)
NAME:  Yolanda [name], MRN:  [id number], DOB:  01/14/1956, LOS: 6 ADMISSION DATE:  04/28/2022, CONSULTATION DATE:  05/04/22 REFERRING MD:  Cathlean Sauer, CHIEF COMPLAINT:  Worsening respiratory status   History of Present Illness:  Pertinent past medical history of [name] cirrhosis, HFpEF, obesity, PE not on AC, A-fib not on AC, chronic respiratory failure on 3L [place] O2,  OSA, hypertension, anxiety, depression, GERD, hypothyroidism.  She was recently admitted at [facility] 10/11-10/19 with AMS and acute cystitis.   Work-up was negative for PE, found to have new onset Afib, multi-focal PNA and effusion and was diuresed and discharged home on 10/19.   Received Flagyl, Cefepime and Doxycycline inpatient and discharged Augmentin and Doxycyline.    She was home one day before returning to the ER 10/20 with worsening shortness  of breath and hypoxia.   She was in significant respiratory distress in the ED and was placed on Bipap.  CXR again showed multi-focal bilateral infiltrates.  She was admitted and started back on Cefepime.  On 10/22 her respiratory status began to decline despite bipap and PCCM was consulted for transfer to the ICU  Pertinent  Medical History   has a past medical history of Abnormal uterine bleeding, Anxiety, Arthritis, Chronic abdominal pain, Chronic pain in left foot, Depression, Elevated liver function tests (2018), Endometriosis, Fibromyalgia, GERD (12/21/2009), Hypertension, Hypothyroidism, IBS (irritable bowel syndrome), Internal hemorrhoids, Kidney stone, Liver cirrhosis secondary to NASH ([place]), Obesity, morbid ([place]), PONV (postoperative nausea and vomiting), Psoriatic arthritis ([place]), Pulmonary embolism ([place]) (12/2005), Rheumatoid arthritis ([place]), Sleep apnea, and Vitamin D deficiency.   Significant Hospital Events: Including procedures, antibiotic start and stop dates in addition to other pertinent events   10/19 discharged home after admission for PNA 10/20 Admitted with continued PNA  and hypoxic respiratory failure 10/22 Worsening hypoxic respiratory failure despite Bipap, ICU transfer, intubated 10/25 extubated.  Remained encephalopathic  Interim History / Subjective:  No distress but confused intermittently reporting nausea  Objective   Blood pressure 137/72, pulse 96, temperature 97.8 F (36.6 C), temperature source Oral, resp. rate 15, height 5' 4"  (1.626 m), weight 106.1 kg, last menstrual period 07/11/2007, SpO2 97 %.    FiO2 (%):  [36 %] 36 %   Intake/Output Summary (Last 24 hours) at 05/04/2022 0841 Last data filed at 05/04/2022 0700 Gross per 24 hour  Intake 824.94 ml  Output 4450 ml  Net -3625.06 ml   Filed Weights   05/03/22 0248 05/03/22 2200 05/04/22 0500  Weight: 108 kg 108 kg 106.1 kg   General: Awake, confused still pleasant and cooperative. HEENT normocephalic atraumatic no jugular venous distention Pulmonary some scattered rhonchi diminished bases no accessory use still on nasal cannula Cardiac: Regular rate and rhythm Abdomen soft nontender Extremities: Warm dry no edema Neuro awake oriented x2, slow to respond GU clear yellow   Resolved Hospital Problem list   Hypernatremia resolved   Assessment & Plan:   Acute on Chronic Hypoxic Respiratory Failure secondary to Multifocal PNA: treat as HCAP vs aspiration, complicated by Acute on chronic HFpEF with pulmonary edema I am concerned about underlying aspiration given long history of dysphagia, nighttime cough, and intermittently waking up feeling "choking" still has some nausea.  I wonder about either esophageal dysfunction or stricture Plan Continuing to wean FiO2 Continuous pulse oximetry Incentive spirometry Aspiration and reflux precautions Esophagram ordered for today we need to mobilize her as well She is currently on day number 5 of 7 Zosyn Continue IV diuresis as long as BUN  and creatinine tolerate Repeat x-ray in a.m.  Atrial Fibrillation with RVR  Plan Continue  Eliquis, stop heparin tele Continue aspirin Add back Lopressor cont amiodarone   Fluid and electrolyte imbalance: hypokalemia Plan Replace and recheck   Acute metabolic encephalopathy.  Suspect mixed picture of post infection, also hepatic encephalopathic component Plan Continue supportive care See below re: cirrhosis plan  NASH liver cirrhosis  Plan Continue rifaximin, increase lactulose Continue spironolactone  Type 2 DM Plan Holding metformin continue sliding scale insulin  Hypothyroidism Plan Continue levothyroxine  GERD Plan Esophagram PPI and Carafate  Nutrition: Plan Advance diet as tolerated  Best Practice (right click and "Reselect all SmartList Selections" daily)   Diet/type: dysphagia diet (see orders) DVT prophylaxis: DOAC GI prophylaxis: PPI Lines: N/A Foley:  Yes, and it is still needed Code Status:  full code Last date of multidisciplinary goals of care discussion [Wants 3-7 day trial on vent, family updated at bedside 10/24] My cct 67 min Erick Colace ACNP-BC [place] Pager # [phone] OR # [phone] if no answer   05/04/2022 8:41 AM

## 2022-05-04 NOTE — Progress Notes (Signed)
eLink Physician-Brief Progress Note Patient Name: Yolanda [name] DOB: 12-13-1955 MRN: [id number]   Date of Service  05/04/2022  HPI/Events of Note  Back in AFIB with RVR - Ventricular rate = 200. BP 89/75. Sat = 95%. Presently on an Amiodarone IV infusion and Eliquis.   eICU Interventions  Plan: Amiodarone 150 mg IV over 10 minutes. BMP and Mg++ level STAT.     Intervention Category Major Interventions: Arrhythmia - evaluation and management  Shaneil Yazdi Eugene 05/04/2022, 10:00 PM

## 2022-05-04 NOTE — Progress Notes (Addendum)
eLink Physician-Brief Progress Note Patient Name: Yolanda [name] DOB: [birth year] MRN: [id number]   Date of Service  05/04/2022  HPI/Events of Note  AFIB with RVR - Ventricular rate = 160-170 post Amiodarone bolus. BP = 119/100.  eICU Interventions  Plan: Bolus with Amiodarone 150 mg IV over 10 minutes now.  Await BMP and Mg++ level.  D/C Transfer to progressive care.      Intervention Category Major Interventions: Arrhythmia - evaluation and management  Larron Armor Eugene 05/04/2022, 10:42 PM

## 2022-05-04 NOTE — Progress Notes (Addendum)
ANTICOAGULATION CONSULT NOTE - Initial Consult  Pharmacy Consult for Heparin Indication: atrial fibrillation  Allergies  Allergen Reactions   Azithromycin Hives   Lisinopril Cough   Morphine Hives   Sulfa Antibiotics Other (See Comments)    Yeast infection    Patient Measurements: Height: 5' 4"  (162.6 cm) Weight: 108 kg (238 lb 1.6 oz) IBW/kg (Calculated) : 54.7 Heparin Dosing Weight: 80kg  Vital Signs: BP: 145/73 (10/26 0500) Pulse Rate: 92 (10/26 0500)  Labs: Recent Labs    05/02/22 0456 05/02/22 0614 05/03/22 0249 05/03/22 1615 05/03/22 2216 05/04/22 0027 05/04/22 0425  HGB 10.1* 10.5* 10.3*  --   --   --  12.5  HCT 31.8* 31.0* 32.8*  --   --   --  39.5  PLT 139*  --  132*  --   --   --  206  LABPROT 19.6*  --   --   --   --   --   --   INR 1.7*  --   --   --   --   --   --   HEPARINUNFRC  --   --   --   --   --   --  0.31  CREATININE 0.90  --  0.86 0.92 1.12*  --  0.89  TROPONINIHS  --   --   --   --  19* 17  --      Estimated Creatinine Clearance: 73.6 mL/min (by C-G formula based on SCr of 0.89 mg/dL).   Medical History: Past Medical History:  Diagnosis Date   Abnormal uterine bleeding    Anxiety    Arthritis    Chronic abdominal pain    Chronic pain in left foot    Depression    Elevated liver function tests 2018   Endometriosis    Fibromyalgia    GERD 12/21/2009   Qualifier: Diagnosis of  By: Craige Cotta     Hypertension    stopped meds in Aug 2015   Hypothyroidism    IBS (irritable bowel syndrome)    Internal hemorrhoids    Kidney stone    Liver cirrhosis secondary to NASH ([facility])    Obesity, morbid ([facility]CC)    PONV (postoperative nausea and vomiting)    Psoriatic arthritis ([place])    Pulmonary embolism ([place]) 12/2005   Qualifier: Diagnosis of  By: Kellie Simmering LPN, Almyra Free     Rheumatoid arthritis [facility])    Sleep apnea    Vitamin D deficiency     Medications:  Scheduled:   aspirin  81 mg Per Tube Daily   budesonide (PULMICORT) nebulizer  solution  0.5 mg Nebulization BID   Chlorhexidine Gluconate Cloth  6 each Topical Daily   folic acid  1 mg Per Tube Daily   furosemide  40 mg Intravenous BID   guaiFENesin  15 mL Per Tube Q6H   insulin aspart  0-15 Units Subcutaneous Q4H   ipratropium-albuterol  3 mL Nebulization Q6H   lactulose  20 g Per Tube Daily   levothyroxine  75 mcg Per Tube Q0600   multivitamin with minerals  1 tablet Per Tube Daily   pantoprazole  40 mg Per Tube BID   pilocarpine  5 mg Per Tube BID   potassium chloride  40 mEq Oral Q4H   predniSONE  40 mg Per Tube Q breakfast   rifaximin  550 mg Per Tube BID   rosuvastatin  10 mg Per Tube Daily   spironolactone  50 mg  Per Tube Daily   sucralfate  1 g Per Tube TID WC & HS   Infusions:   sodium chloride Stopped (04/30/22 0410)   amiodarone     heparin 1,200 Units/hr (05/03/22 2331)   magnesium sulfate bolus IVPB     piperacillin-tazobactam (ZOSYN)  IV 12.5 mL/hr at 05/04/22 0300    Assessment: 55 YOF admitted for CHF exacerbation with Afib not on anticoagulation PTA due to liver disease. Overnight experienced Afib with RVR. Pharmacy consulted to start heparin.  Heparin level is therapeutic at 0.31, however, the level was drawn ~2 hours early. The bolus is likely still reflected in this reading. Will increase slightly to remain within range and check 6 hour heparin level. CBC stable, no bleeding reported.  Goal of Therapy:  Heparin level 0.3-0.7 units/ml Monitor platelets by anticoagulation protocol: Yes   Plan:  Increase heparin 1250 units/hr  Check 6hr heparin level   Daily heparin level and CBC Monitor for signs/symptoms of bleeding  Ursula Beath, PharmD Clinical Pharmacist 05/04/2022,5:31 AM  ADDENDUM:  Per CCM, will start Eliquis for afib given stable pltc and recurrent afib.  Start Eliquis 5 mg BID Thank you for allowing pharmacy to participate in this patient's care.  Reatha Harps, PharmD PGY2 Pharmacy Resident 05/04/2022 7:17  AM Check [email or web address] for unit specific pharmacy number

## 2022-05-05 ENCOUNTER — Inpatient Hospital Stay (HOSPITAL_COMMUNITY): Payer: Medicare Other

## 2022-05-05 DIAGNOSIS — Z66 Do not resuscitate: Secondary | ICD-10-CM | POA: Diagnosis not present

## 2022-05-05 DIAGNOSIS — Z7189 Other specified counseling: Secondary | ICD-10-CM

## 2022-05-05 DIAGNOSIS — Z515 Encounter for palliative care: Secondary | ICD-10-CM | POA: Diagnosis not present

## 2022-05-05 DIAGNOSIS — I5033 Acute on chronic diastolic (congestive) heart failure: Secondary | ICD-10-CM | POA: Diagnosis not present

## 2022-05-05 LAB — HEPATIC FUNCTION PANEL
ALT: 33 U/L (ref 0–44)
AST: 49 U/L — ABNORMAL HIGH (ref 15–41)
Albumin: 2.4 g/dL — ABNORMAL LOW (ref 3.5–5.0)
Alkaline Phosphatase: 61 U/L (ref 38–126)
Bilirubin, Direct: 0.4 mg/dL — ABNORMAL HIGH (ref 0.0–0.2)
Indirect Bilirubin: 0.9 mg/dL (ref 0.3–0.9)
Total Bilirubin: 1.3 mg/dL — ABNORMAL HIGH (ref 0.3–1.2)
Total Protein: 9.5 g/dL — ABNORMAL HIGH (ref 6.5–8.1)

## 2022-05-05 LAB — BASIC METABOLIC PANEL
Anion gap: 7 (ref 5–15)
BUN: 35 mg/dL — ABNORMAL HIGH (ref 8–23)
CO2: 35 mmol/L — ABNORMAL HIGH (ref 22–32)
Calcium: 10 mg/dL (ref 8.9–10.3)
Chloride: 99 mmol/L (ref 98–111)
Creatinine, Ser: 1.04 mg/dL — ABNORMAL HIGH (ref 0.44–1.00)
GFR, Estimated: 59 mL/min — ABNORMAL LOW (ref >60)
Glucose, Bld: 114 mg/dL — ABNORMAL HIGH (ref 70–99)
Potassium: 4.4 mmol/L (ref 3.5–5.1)
Sodium: 141 mmol/L (ref 135–145)

## 2022-05-05 LAB — CBC
HCT: 39.6 % (ref 36.0–46.0)
Hemoglobin: 12.8 g/dL (ref 12.0–15.0)
MCH: 32.2 pg (ref 26.0–34.0)
MCHC: 32.3 g/dL (ref 30.0–36.0)
MCV: 99.5 fL (ref 80.0–100.0)
Platelets: 203 10*3/uL (ref 150–400)
RBC: 3.98 MIL/uL (ref 3.87–5.11)
RDW: 17.4 % — ABNORMAL HIGH (ref 11.5–15.5)
WBC: 19 10*3/uL — ABNORMAL HIGH (ref 4.0–10.5)
nRBC: 0.1 % (ref 0.0–0.2)

## 2022-05-05 LAB — GLUCOSE, CAPILLARY
Glucose-Capillary: 109 mg/dL — ABNORMAL HIGH (ref 70–99)
Glucose-Capillary: 118 mg/dL — ABNORMAL HIGH (ref 70–99)
Glucose-Capillary: 118 mg/dL — ABNORMAL HIGH (ref 70–99)
Glucose-Capillary: 129 mg/dL — ABNORMAL HIGH (ref 70–99)
Glucose-Capillary: 130 mg/dL — ABNORMAL HIGH (ref 70–99)
Glucose-Capillary: 133 mg/dL — ABNORMAL HIGH (ref 70–99)
Glucose-Capillary: 134 mg/dL — ABNORMAL HIGH (ref 70–99)

## 2022-05-05 LAB — MAGNESIUM
Magnesium: 2.1 mg/dL (ref 1.7–2.4)
Magnesium: 2.1 mg/dL (ref 1.7–2.4)

## 2022-05-05 LAB — AMMONIA: Ammonia: 28 umol/L (ref 9–35)

## 2022-05-05 LAB — PHOSPHORUS: Phosphorus: 3.6 mg/dL (ref 2.5–4.6)

## 2022-05-05 MED ORDER — AMIODARONE LOAD VIA INFUSION: 150.0000 mg | Freq: Once | INTRAVENOUS | Status: DC

## 2022-05-05 MED ORDER — PREDNISONE 20 MG PO TABS
20.0000 mg | ORAL_TABLET | Freq: Every day | ORAL | Status: AC
  Administered 2022-05-06: 20 mg via ORAL
  Filled 2022-05-05: qty 1

## 2022-05-05 NOTE — TOC Progression Note (Signed)
Transition of Care [facility]) - Progression Note    Patient Details  Name: Yolanda [name] MRN: [id number] Date of Birth: 1956-03-11  [facility] CM/SW Contact  [name], Ocie Cornfield, RN Phone Number: 05/05/2022, 12:28 PM  Clinical Narrative:  Case Manager spoke with daughter [name] at the bedside this morning. Daughter states patient is active with Outpatient Palliative with [facility]. Case Manager did confirm with Aldona Bar at [facility] and they will follow the patient outpatient. Patient will return to Amanda's home [address] [place]  [id number] once stable. PT/OT recommendations for CIR. Inpatient Rehab Coordinator has reviewed the patient and per notes-patient is not at a level to tolerate CIR at this time; however, could become a candidate as she progresses. Case Manager will continue to follow for transition of care needs.      Expected Discharge Plan: [place] Barriers to Discharge: Continued Medical Work up  Expected Discharge Plan and Services Expected Discharge Plan: [place] In-house Referral: Clinical Social Work Discharge Planning Services: CM Consult Post Acute Care Choice: Home Health, Resumption of Svcs/PTA Provider Living arrangements for the past 2 months: Single Family Home                   DME Agency: NA       HH Arranged: RN, Disease Management, Nurse's Aide, Social Work [employer] Agency: Microbiologist ([place]) Date [place]: 05/04/22 Time [place]: 1626 Representative spoke with at [place]: [place] ([place]) Interventions    Readmission Risk Interventions    05/04/2022    4:24 PM 04/25/2022    1:27 PM 09/22/2021   11:13 AM  Readmission Risk Prevention Plan  Post Dischage Appt   Complete  Medication Screening   Complete  Transportation Screening Complete Complete Complete  HRI or [place]  Complete   Social  Work Consult for [place] Planning/Counseling  Complete   Palliative Care Screening  Not Applicable   Medication Review Press photographer) Complete Complete   HRI or [place] Complete    SW Recovery Care/Counseling Consult Complete    [place] Not Applicable

## 2022-05-05 NOTE — Progress Notes (Addendum)
PROGRESS NOTE    Yolanda [name]  BMW:[id number] DOB: 08-22-1955 DOA: 04/28/2022 PCP: Celene Squibb, MD  65/F chronically ill with [name] cirrhosis, diastolic CHF, obesity, PE not on AC, A-fib not on AC, chronic respiratory failure on 3L [place] O2,  OSA, hypertension, anxiety, depression, GERD, rheumatoid arthritis, hypothyroidism.  She was recently admitted at [facility] 10/11-10/19 with AMS and acute cystitis.   Work-up was negative for PE, found to have new onset Afib, multi-focal PNA and effusion and was diuresed and discharged home on 10/19 on antibiotics. -Return to the ED 10/20 with worsening shortness of breath and hypoxia, placed on BiPAP, CXR again showed multi-focal bilateral infiltrates, restarted antibiotics and diuretics on 10/22 her respiratory status began to decline despite bipap and PCCM was consulted for transfer to the ICU 10/22 intubated 10/25: Extubated, remained encephalopathic Transferred to [facility] service 10/27   Subjective: Sleepy and tired, no other events overnight  Assessment and Plan:  Acute on Chronic Hypoxic Respiratory Failure secondary to Multifocal PNA: Aspiration Acute on chronic HFpEF with pulmonary edema -Extubated 10/25 -Concern for recurrent aspiration, possibly esophageal, SLP eval noted -Day 6/7 of IV Zosyn, mild worsening leukocytosis monitor clinically -Barium esophagram today -Aspiration precautions  Acute on chronic diastolic CHF -Echo 27/25 with EF of 36-64%, grade 1 diastolic dysfunction, preserved RV function -Continue IV Lasix 1 more day, continue Aldactone -She is 11 L negative -Albumin is low at baseline with liver disease, transition to torsemide in 1 to 2 days   Atrial Fibrillation with RVR  -Required IV amiodarone bolus last night -Continue Lopressor and Eliquis -Now on Amio gtt., transition to p.o. amiodarone tomorrow   hypokalemia -Replaced   Acute metabolic encephalopathy.   -Suspect mixed picture of post infection, also hepatic  encephalopathic component -repeat ammonia level -Antibiotics as noted above -Also check ABG, could have untreated OSA   NASH liver cirrhosis  -Continue rifaximin, increase lactulose Continue spironolactone   Type 2 DM -Holding metformin continue sliding scale insulin   Hypothyroidism -Continue levothyroxine   GERD -Esophagram PPI and Carafate  Class 3 obesity (HCC) Calculated BMI is 44.3 Patient with very poor physical functional status, uses a walker for ambulation.   Depression and anxiety,  Continue with daily duloxetine.  Discontinued alprazolam.   DVT prophylaxis: Eliquis Code Status: Full code Family Communication: None at bedside Disposition Plan: Transfer to progressive  Consultants: PCCM   Procedures:   Antimicrobials:    Objective: Vitals:   05/05/22 0414 05/05/22 0815 05/05/22 0842 05/05/22 0846  BP:      Pulse:   (!) 101   Resp:   16   Temp:  99.3 F (37.4 C)    TempSrc:  Oral    SpO2: 97%  96% 96%  Weight:      Height:        Intake/Output Summary (Last 24 hours) at 05/05/2022 0855 Last data filed at 05/05/2022 0200 Gross per 24 hour  Intake 818.84 ml  Output 1800 ml  Net -981.16 ml   Filed Weights   05/03/22 2200 05/04/22 0500 05/05/22 0403  Weight: 108 kg 106.1 kg 104.1 kg    Examination:  General exam: Appears calm and comfortable  Respiratory system: Clear to auscultation Cardiovascular system: S1 & S2 heard, RRR.  Abd: nondistended, soft and nontender.Normal bowel sounds heard. Central nervous system: Alert and oriented. No focal neurological deficits. Extremities: no edema Skin: No rashes Psychiatry:  Mood & affect appropriate.     Data Reviewed:   CBC: Recent Labs  Lab 04/28/22 1429 04/28/22 1657 05/01/22 0411 05/01/22 1151 05/02/22 0456 05/02/22 0614 05/03/22 0249 05/04/22 0425 05/05/22 0359  WBC 11.8*  --  11.6*  --  4.9  --  7.4 16.3* 19.0*  NEUTROABS 8.7*  --   --   --   --   --   --   --   --   HGB  9.8*   < > 9.3*   < > 10.1* 10.5* 10.3* 12.5 12.8  HCT 30.4*   < > 29.7*   < > 31.8* 31.0* 32.8* 39.5 39.6  MCV 101.0*  --  102.1*  --  100.6*  --  101.5* 101.5* 99.5  PLT 222  --  156  --  139*  --  132* 206 203   < > = values in this interval not displayed.   Basic Metabolic Panel: Recent Labs  Lab 05/02/22 0456 05/02/22 4098 05/02/22 1704 05/03/22 0249 05/03/22 1615 05/03/22 2216 05/04/22 0425 05/04/22 2221 05/05/22 0359  NA 145   < >  --  149* 145 143 141 141 141  K 3.2*   < >  --  3.4* 3.9 3.6 3.1* 4.9 4.4  CL 106  --   --  108 105 98 97* 96* 99  CO2 33*  --   --  35* 37* 33* 35* 34* 35*  GLUCOSE 149*  --   --  142* 112* 118* 103* 145* 114*  BUN 40*  --   --  41* 33* 33* 32* 36* 35*  CREATININE 0.90  --   --  0.86 0.92 1.12* 0.89 1.14* 1.04*  CALCIUM 9.0  --   --  9.7 10.1 10.5* 10.4* 10.3 10.0  MG 1.6*  --   --  2.4  --  1.9 1.8 2.1 2.1  PHOS 2.2*  --  4.9* 2.4*  --   --   --   --  3.6   < > = values in this interval not displayed.   GFR: Estimated Creatinine Clearance: 61.7 mL/min (A) (by C-G formula based on SCr of 1.04 mg/dL (H)). Liver Function Tests: Recent Labs  Lab 04/28/22 1429 05/02/22 0456 05/03/22 1615  AST 42* 34 38  ALT _0 ALKPHOS 61 50 54  BILITOT 1.4* 1.1 1.3*  PROT 9.2* 9.6* 9.8*  ALBUMIN 2.3* 2.7* 2.7*   No results for input(s): "LIPASE", "AMYLASE" in the last 168 hours. Recent Labs  Lab 04/28/22 1423 05/02/22 0456 05/03/22 0249  AMMONIA 25 64* 23   Coagulation Profile: Recent Labs  Lab 04/28/22 1853 05/02/22 0456 05/04/22 0425  INR 1.7* 1.7* 1.8*   Cardiac Enzymes: No results for input(s): "CKTOTAL", "CKMB", "CKMBINDEX", "TROPONINI" in the last 168 hours. BNP (last 3 results) No results for input(s): "PROBNP" in the last 8760 hours. HbA1C: No results for input(s): "HGBA1C" in the last 72 hours. CBG: Recent Labs  Lab 05/04/22 1557 05/04/22 2014 05/04/22 2343 05/05/22 0346 05/05/22 0812  GLUCAP 117* 111* 129* 130*  118*   Lipid Profile: No results for input(s): "CHOL", "HDL", "LDLCALC", "TRIG", "CHOLHDL", "LDLDIRECT" in the last 72 hours. Thyroid Function Tests: No results for input(s): "TSH", "T4TOTAL", "FREET4", "T3FREE", "THYROIDAB" in the last 72 hours. Anemia Panel: No results for input(s): "VITAMINB12", "FOLATE", "FERRITIN", "TIBC", "IRON", "RETICCTPCT" in the last 72 hours. Urine analysis:    Component Value Date/Time   COLORURINE YELLOW 04/22/2022 1430   APPEARANCEUR CLEAR 04/22/2022 1430   APPEARANCEUR Clear 03/08/2022 1549   LABSPEC 1.016 04/22/2022 1430  PHURINE 5.0 04/22/2022 1430   GLUCOSEU NEGATIVE 04/22/2022 1430   HGBUR SMALL (A) 04/22/2022 1430   BILIRUBINUR NEGATIVE 04/22/2022 1430   BILIRUBINUR Negative 03/08/2022 1549   KETONESUR NEGATIVE 04/22/2022 1430   PROTEINUR NEGATIVE 04/22/2022 1430   UROBILINOGEN negative (A) 07/02/2019 0932   UROBILINOGEN 0.2 04/15/2015 0028   NITRITE NEGATIVE 04/22/2022 1430   LEUKOCYTESUR SMALL (A) 04/22/2022 1430   Sepsis Labs: _0 (procalcitonin:4,lacticidven:4)  ) Recent Results (from the past 240 hour(s))  Culture, BAL-quantitative w Gram Stain     Status: None   Collection Time: 04/30/22  3:36 PM   Specimen: Bronchoalveolar Lavage; Respiratory  Result Value Ref Range Status   Specimen Description BRONCHIAL ALVEOLAR LAVAGE  Final   Special Requests Normal  Final   Gram Stain   Final    RARE WBC PRESENT, PREDOMINANTLY PMN NO ORGANISMS SEEN    Culture   Final    NO GROWTH 2 DAYS Performed at [facility], [place] [address], [place], [place] [address]    Report Status 05/02/2022 FINAL  Final  MRSA Next Gen by PCR, Nasal     Status: None   Collection Time: 05/01/22  4:11 AM   Specimen: Nasal Mucosa; Nasal Swab  Result Value Ref Range Status   MRSA by PCR Next Gen NOT DETECTED NOT DETECTED Final    Comment: (NOTE) The GeneXpert MRSA Assay (FDA approved for NASAL specimens only), is one component of a comprehensive  MRSA colonization surveillance program. It is not intended to diagnose MRSA infection nor to guide or monitor treatment for MRSA infections. Test performance is not FDA approved in patients less than 60 years old. Performed at [facility], [place] [address] [place], [place] [address]      Radiology Studies: DG Chest Port 1 View  Result Date: 05/05/2022 CLINICAL DATA:  Pulmonary edema EXAM: PORTABLE CHEST 1 VIEW COMPARISON:  Radiograph 05/03/2022 FINDINGS: Unchanged cardiomegaly. Left upper extremity PICC tip overlies the mid SVC. Defibrillator pads overlie the left chest. There are diffuse interstitial and alveolar opacities. There is a small right and trace left pleural effusion. No evidence of pneumothorax. No acute osseous abnormality. Bilateral shoulder degenerative change. Thoracic spondylosis. IMPRESSION: Persistent multifocal pneumonia and/or edema with bilateral pleural effusions, right greater than left, not significant changed from prior. Electronically Signed   By: Maurine Simmering M.D.   On: 05/05/2022 08:39   DG Swallowing Func-Speech Pathology  Result Date: 05/04/2022 Table formatting from the original result was not included. Objective Swallowing Evaluation: Type of Study: MBS-Modified Barium Swallow Study  Patient Details Name: Yolanda [name] MRN: [id number] Date of Birth: 02/02/1956 Today's Date: 05/04/2022 Time: SLP Start Time (ACUTE ONLY): 1430 -SLP Stop Time (ACUTE ONLY): 3300 SLP Time Calculation (min) (ACUTE ONLY): 17 min Past Medical History: Past Medical History: Diagnosis Date  Abnormal uterine bleeding   Anxiety   Arthritis   Chronic abdominal pain   Chronic pain in left foot   Depression   Elevated liver function tests 2018  Endometriosis   Fibromyalgia   GERD 12/21/2009  Qualifier: Diagnosis of  By: Craige Cotta    Hypertension   stopped meds in Aug 2015  Hypothyroidism   IBS (irritable bowel syndrome)   Internal hemorrhoids   Kidney stone   Liver cirrhosis secondary  to NASH ([place])   Obesity, morbid (HCC)   PONV (postoperative nausea and vomiting)   Psoriatic arthritis ([place])   Pulmonary embolism ([place]) 12/2005  Qualifier: Diagnosis of  By: Kellie Simmering LPN,  Almyra Free    Rheumatoid arthritis ([place])   Sleep apnea   Vitamin D deficiency  Past Surgical History: Past Surgical History: Procedure Laterality Date  ABDOMINAL SURGERY    laparoscopy  BIOPSY N/A 03/24/2014  Procedure: GASTRIC BIOPSIES;  Surgeon: Danie Binder, MD;  Location: [facility];  Service: Endoscopy;  Laterality: N/A;  BIOPSY  10/28/2019  Procedure: BIOPSY;  Surgeon: Danie Binder, MD;  Location: [facility];  Service: Endoscopy;;  gastric nodule  BREAST LUMPECTOMY Right   CESAREAN SECTION    X2  COLONOSCOPY  2006  internal hemorrhoids  COLONOSCOPY WITH PROPOFOL N/A 03/24/2014  Dr. [name]: 2 tubular adenomas removed, hemorrhoids  COLONOSCOPY WITH PROPOFOL N/A 10/28/2019  Fields: External and internal hemorrhoids, 8 polyps ranging from 2 to 5 mm in size removed from the colon.  Multiple tubular adenomas.  Next colonoscopy in 3 years.  CYSTOSCOPY W/ RETROGRADES  01/23/2012  Procedure: CYSTOSCOPY WITH RETROGRADE PYELOGRAM;  Surgeon: Marissa Nestle, MD;  Location: [facility];  Service: Urology;  Laterality: Left;  DILATATION & CURETTAGE/HYSTEROSCOPY WITH MYOSURE N/A 11/18/2014  Procedure: DILATATION & CURETTAGE/HYSTEROSCOPY WITH MYOSURE, resection of polyp;  Surgeon: Cheri Fowler, MD;  Location: [facility];  Service: Gynecology;  Laterality: N/A;  DILATION AND CURETTAGE OF UTERUS    ESOPHAGOGASTRODUODENOSCOPY   08/24/2006  Dr. Veto Kemps erythema of the antrum without erosion or ulcers/Otherwise, normal esophagus without evidence of Barrett's path with chronic gastritis  ESOPHAGOGASTRODUODENOSCOPY (EGD) WITH PROPOFOL N/A 03/24/2014  Dr. [name]: gastritis  ESOPHAGOGASTRODUODENOSCOPY (EGD) WITH PROPOFOL N/A 10/28/2019  Fields: Esophagus appeared normal, empiric dilation due to history of dysphagia.  6 mm nodule seen in the gastric cardia.   Mild portal hypertensive gastropathy.  Nodule from the stomach biopsied and showed mild chronic gastritis, no H. pylori.  POLYPECTOMY N/A 03/24/2014  Procedure: POLYPECTOMY;  Surgeon: Danie Binder, MD;  Location: [facility];  Service: Endoscopy;  Laterality: N/A;  POLYPECTOMY  10/28/2019  Procedure: POLYPECTOMY;  Surgeon: Danie Binder, MD;  Location: [facility];  Service: Endoscopy;;  hepatic flexure, ascending colon,sigmoid colon, rectal  REMOVAL OF STONES  01/23/2012  Procedure: REMOVAL OF STONES;  Surgeon: Marissa Nestle, MD;  Location: [facility];  Service: Urology;  Laterality: N/A;  SAVORY DILATION N/A 10/28/2019  Procedure: SAVORY DILATION;  Surgeon: Danie Binder, MD;  Location: [facility];  Service: Endoscopy;  Laterality: N/A;  TUBAL LIGATION   HPI: Pt is a 66 y.o. female who presented with SOB and fatigue. CXR on admission revealed multifocal pneumonia. ETT 10/22-10/25. Family reported to ordering MD that pt has a fairly significant history of poorly controlled reflux, choking after meals, and at times choking waking up at night. Plan for esopghagram when pt is medically stable. Pt recently admitted at [facility] 10/11-10/19 with AMS and acute cystitis. Pt found to have new onset Afib, multi-focal PNA and effusion and was diuresed and discharged home. PMH: cirrhosis secondary to NASH with recent hepatic encephalopathy, PAF not on anticoagulation, chronic HFpEF, recent multifocal pneumonia, hypothyroidism, GERD, morbid obesity, COPD Gold stage II, esophagram 04/13/17 WNL. EGD 10/28/19: Esophagus appeared normal, empiric dilation due to history of dysphagia.  6 mm nodule seen in the gastric cardia.  Mild portal hypertensive gastropathy.  Nodule from the stomach biopsied and showed mild chronic gastritis, no H. pylori.  No data recorded  Recommendations for follow up therapy are one component of a multi-disciplinary discharge planning process, led by the attending physician.  Recommendations may be updated based  on patient status,  additional functional criteria and insurance authorization. Assessment / Plan / Recommendation   05/04/2022   3:34 PM Clinical Impressions Clinical Impression Pt's oropharyngeal swallow mechanism was within functional limits with minimal vallecular and pyriform sinus residue which was cleared with a liquid wash or with pt's independent use of secondary swallows. An episode of regurgitation (thoracic esophagus to hyopharynx) was noted when pt swallowed the 57m barium tablet with thin liquids . Aspiration was not observed during this episode, but based on reports from the pt and her daughter, and the pt's pneumonia, SLP questions whether these episodes intermittently result in aspiration. Esophageal visualization revealed slowed movement of solids in the upper thoracic esophagus and halted movement of the barium tablet at the GE junction which was facilitated with an additional bolus of thin liquids. SLP is in agreement with esophageal assessment. Pt may have up to regular texture solids and thin liquids from an oropharyngeal standpoint; pt's case discussed with PCCM (i.e., Dr. STamala Julianand PSalvadore Dom NP) and it was agreed to advance pt's diet. SLP will follow briefly to ensure tolerance of the advanced diet. SLP Visit Diagnosis Dysphagia, unspecified (R13.10) Impact on safety and function Mild aspiration risk     05/04/2022   3:34 PM Treatment Recommendations Treatment Recommendations No treatment recommended at this time     05/04/2022   3:34 PM Prognosis Barriers to Reach Goals Time post onset;Severity of deficits   05/04/2022   3:34 PM Diet Recommendations SLP Diet Recommendations Regular solids;Thin liquid Liquid Administration via Cup;Straw Medication Administration Whole meds with puree Compensations Slow rate;Small sips/bites Postural Changes Seated upright at 90 degrees;Remain semi-upright after after feeds/meals (Comment)     05/04/2022   3:34 PM Other Recommendations Recommended  Consults Consider esophageal assessment Oral Care Recommendations Oral care BID Follow Up Recommendations No SLP follow up Functional Status Assessment Patient has had a recent decline in their functional status and demonstrates the ability to make significant improvements in function in a reasonable and predictable amount of time.   05/03/2022   3:30 PM Frequency and Duration  Speech Therapy Frequency (ACUTE ONLY) min 2x/week Treatment Duration 2 weeks     05/04/2022   3:34 PM [facility]   05/04/2022   3:34 PM [facility]   05/04/2022   3:34 PM Cervical Esophageal Phase  Cervical Esophageal Phase WTaylor [facility] HospitalShanika I. PHardin Negus MS, CNorth San YsidroOffice number [phone] Marshall10/26/2023, 4:10 PM                       Scheduled Meds:  apixaban  5 mg Oral BID   aspirin  81 mg Oral Daily   budesonide (PULMICORT) nebulizer solution  0.5 mg Nebulization BID   Chlorhexidine Gluconate Cloth  6 each Topical Daily   DULoxetine  60 mg Oral Daily   folic acid  1 mg Oral Daily   furosemide  40 mg Intravenous BID   guaiFENesin  15 mL Per Tube Q6H   insulin aspart  0-15 Units Subcutaneous Q4H   lactulose  20 g Oral BID   levothyroxine  75 mcg Oral Q0600   metoprolol tartrate  25 mg Oral BID   multivitamin with minerals  1 tablet Oral Daily   pantoprazole  40 mg Oral BID   pilocarpine  5 mg Oral BID   predniSONE  40 mg Oral Q breakfast   rifaximin  550 mg Oral BID   rosuvastatin  10 mg Oral Daily   spironolactone  50 mg Oral Daily   sucralfate  1 g Oral TID WC & HS   Continuous Infusions:  sodium chloride Stopped (04/30/22 0410)   amiodarone 30 mg/hr (05/05/22 0200)   piperacillin-tazobactam (ZOSYN)  IV 12.5 mL/hr at 05/05/22 0200     LOS: 7 days    Time spent: 73mn    PDomenic Polite MD [facility]   05/05/2022, 8:55 AM

## 2022-05-05 NOTE — Consult Note (Signed)
Palliative Medicine Inpatient Consult Note  Consulting Provider: Domenic Polite, MD  Reason for consult:   [facility] Consult  Reason for Consult? Goals of care   05/05/2022  HPI:  Per intake H&P --> 65/F chronically ill with [name] cirrhosis, diastolic CHF, obesity, PE not on AC, A-fib not on AC, chronic respiratory failure on 3L [place] O2,  OSA, hypertension, anxiety, depression, GERD, rheumatoid arthritis, hypothyroidism.  She was recently admitted at [facility] 10/11-10/19 and readmitted on 10/20 with [facility] PNA. Seen  Palliative care saw Yolanda [name] on 10/17 and 10/18. Given recurrent admission and intubation, we have been asked to see here for further goals of care conversations.   Clinical Assessment/Goals of Care:  *Please note that this is a verbal dictation therefore any spelling or grammatical errors are due to the "[employer]" system interpretation.  I have reviewed medical records including EPIC notes, labs and imaging, received report from bedside RN, assessed the patient.    I met with Yolanda [name] and her daughters [name] and [name] to further discuss diagnosis prognosis, GOC, EOL wishes, disposition and options.   I introduced Palliative Medicine as specialized medical care for people living with serious illness. It focuses on providing relief from the symptoms and stress of a serious illness. The goal is to improve quality of life for both the patient and the family.  Medical History Review and Understanding:  I reviewed with Yolanda [name] her complicated hospitalization in the setting of [name] cirrhosis, diastolic CHF, A-Fib, PE, and respiratory failure. Reviewed recent need for hospitalization in the setting of pneumonia and heart failure. Discussed discharge and readmission w/in 24 hours. We reviewed intubation this hospital stay.   Social History:  The patient is not currently married, she has 2 daughters [name] and [place], and three  grandchildren. She formerly worked in [place] [employer]. She loves her family and has a beloved dog - "Sugarbritches" who brings her great joy. She is woman of faith and practices within the [facility] denomination.   Functional and Nutritional State:  We reviewed that Yolanda [name] was able to use a walker in her home. Prior to hospitalization she was living with her daughter, [name]. She has required move help and uses a walker for mobility. Her appetite has been fairly good. He daughter prepares her meals.   Advance Directives:  A detailed discussion was had today regarding advanced directives.  Patient has not completed a living will. She will do this during this hospital stay.   Code Status:  Concepts specific to code status, artifical feeding and hydration, continued IV antibiotics and rehospitalization was had.  The difference between a aggressive medical intervention path  and a palliative comfort care path for this patient at this time was had.   Encouraged patient/family to consider DNR/DNI status understanding evidenced based poor outcomes in similar hospitalized patient, as the cause of arrest is likely associated with advanced chronic/terminal illness rather than an easily reversible acute cardio-pulmonary event. I explained that DNR/DNI does not change the medical plan and it only comes into effect after a person has arrested (died).  It is a protective measure to keep [place] from harming the patient in their last moments of life. Yolanda [name] was agreeable to DNR/DNI with understanding that patient would not receive CPR, defibrillation, ACLS medications, or intubation.   Discussion:  We discussed the complexities of Yolanda [name]'s hospitalization in the middle of October and her unfortunate rehospitalization. We discussed patients desires moving forward. She is very clear about the  desire for improvement. She has as above made the decision to be a DNAR/DNI as her intubation was painful and she would not desire to go  through that again. She also considered her other disease processes. We discussed her encephalopathy and the likely fluctuations she will endure moving forward.   Reviewed her need to mobilize and the possibility of CIR. Reviewed that patients insurance has lapsed and the need to select a new one.  Patients daughter has questions regarding liver disease and possible genetic component, I shared that I cannot answer that question though perhaps the hepatobiliary team may be able to. She also requests a medical update for which I assured her I would relay to the hospitalist.   Discussed the importance of continued conversation with family and their  medical providers regarding overall plan of care and treatment options, ensuring decisions are within the context of the patients values and GOCs.  Decision Maker: [name] (Daughter): [phone] (Mobile)  SUMMARY OF RECOMMENDATIONS   DNAR/DNI  Chaplain support for advance directives  Goals are for improvement at this time  Continue current care  Code Status/Advance Care Planning: DNAR/DNI  Palliative Prophylaxis:  Aspiration, Bowel Regimen, Delirium Protocol, Frequent Pain Assessment, Oral Care, Palliative Wound Care, and Turn Reposition  Additional Recommendations (Limitations, Scope, Preferences): Continue current care  Psycho-social/Spiritual:  Desire for further Chaplaincy support: Yes - [employer] Additional Recommendations: Discussed outcomes after two complicated hospitalizations   Prognosis: Unclear - multiple co-morbidities and rehospitalization(s).   Discharge Planning: Discharge would ideally be to   Vitals:   05/05/22 1134 05/05/22 1200  BP: 133/64   Pulse: 97   Resp:    Temp:  99.5 F (37.5 C)  SpO2:      Intake/Output Summary (Last 24 hours) at 05/05/2022 1418 Last data filed at 05/05/2022 1300 Gross per 24 hour  Intake 966.93 ml  Output 1475 ml  Net -508.07 ml   Last Weight  Most recent update:  05/05/2022  4:04 AM    Weight  104.1 kg (229 lb 8 oz)            Gen:  Elderly caucasian F in NAD HEENT: moist mucous membranes CV: Regular rate and rhythm  PULM: On 3LPM [place] breathing is even and nonlabored ABD: soft/nontender  EXT: (+) edema  Neuro: Alert and oriented x2-3  PPS: 30%   This conversation/these recommendations were discussed with patient primary care team, Dr. Broadus John  Total Time: 55  Billing based on MDM: High  Problems Addressed: One acute or chronic illness or injury that poses a threat to life or bodily function  Amount and/or Complexity of Data: Category 3:Discussion of management or test interpretation with external physician/other qualified health care professional/appropriate source (not separately reported)  Risks: Decision regarding hospitalization or escalation of hospital care and Decision not to resuscitate or to de-escalate care because of poor prognosis ______________________________________________________ [place] Team Team Cell Phone: [phone] Please utilize secure chat with additional questions, if there is no response within 30 minutes please call the above phone number  Palliative Medicine Team providers are available by phone from 7am to 7pm daily and can be reached through the team cell phone.  Should this patient require assistance outside of these hours, please call the patient's attending physician.

## 2022-05-05 NOTE — Progress Notes (Signed)
SLP Cancellation Note  Patient Details Name: Yolanda [name] MRN: [id number] DOB: [birth year]   Cancelled treatment:       Reason Eval/Treat Not Completed: Patient at procedure or test/unavailable (Pt currently engaged in conversation with palliative medicine regarding [place]. SLP will follow up on a subsequent date.)  Gillian Kluever I. Hardin Negus, [place], [place] Office number [phone]  Horton Marshall 05/05/2022, 2:53 PM

## 2022-05-05 NOTE — Evaluation (Signed)
Occupational Therapy Evaluation Patient Details Name: Yolanda [name] MRN: [id number] DOB: [birth year] Today's Date: 05/05/2022   History of Present Illness Pt is 66 y/o F admitted to [facility] on 04/28/22 wih SOB, hypoxia, acute on chronic heart failure, Afib with RVR. Worsening hypoxic respiratory failure 10/22 with transfer to ICU; ETT 10/22-10/25. Of note, recent hosptial admission 10/11-10/19 with acute encephalopathy. PMH of anxiety, depression, fibroylagia, GERD, HTN, hypothyroidism, obesity, RA, PE, liver cirrhosis.   Clinical Impression   Pt walks with a rollator and is assisted for LE ADLs and all IADLs at baseline. She presents with significant, generalized weakness and dependence in all ADLs. She requires +2 total assist for bed level mobility and demonstrates poor sitting balance. Pt with lethargy, closing her eyes when not stimulated with delayed response speed and decreased problem solving. Recommend post acute rehab upon discharge.      Recommendations for follow up therapy are one component of a multi-disciplinary discharge planning process, led by the attending physician.  Recommendations may be updated based on patient status, additional functional criteria and insurance authorization.   Follow Up Recommendations  Acute inpatient rehab (3hours/day)    Assistance Recommended at Discharge Frequent or constant Supervision/Assistance  Patient can return home with the following Two people to help with walking and/or transfers;Two people to help with bathing/dressing/bathroom;Assistance with cooking/housework;Assistance with feeding;Direct supervision/assist for medications management;Direct supervision/assist for financial management;Assist for transportation;Help with stairs or ramp for entrance    Functional Status Assessment  Patient has had a recent decline in their functional status and/or demonstrates limited ability to make significant improvements in function in a reasonable  and predictable amount of time  Equipment Recommendations  Hospital bed;Other (comment) (hoyer lift)    Recommendations for Other Services       Precautions / Restrictions Precautions Precautions: Fall Restrictions Weight Bearing Restrictions: No      Mobility Bed Mobility Overal bed mobility: Needs Assistance Bed Mobility: Supine to Sit     Supine to sit: +2 for physical assistance, Total assist Sit to supine: +2 for physical assistance, Max assist   General bed mobility comments: Pt with minimal truncal activation.    Transfers                          Balance     Sitting balance-Leahy Scale: Poor                                     ADL either performed or assessed with clinical judgement   ADL                                         General ADL Comments: Pt needing total assist to wash face, comb hair and brush teeth, cannot bring hand to face.     Vision Baseline Vision/History: 1 Wears glasses Ability to See in Adequate Light: 0 Adequate Patient Visual Report: No change from baseline       Perception     Praxis      Pertinent Vitals/Pain Pain Assessment Pain Assessment: Faces Faces Pain Scale: Hurts little more Pain Location: generalized Pain Descriptors / Indicators: Discomfort, Grimacing Pain Intervention(s): Repositioned     Hand Dominance Right   Extremity/Trunk Assessment Upper Extremity Assessment Upper Extremity Assessment: Generalized weakness  Lower Extremity Assessment Lower Extremity Assessment: Defer to PT evaluation   Cervical / Trunk Assessment Cervical / Trunk Assessment: Other exceptions Cervical / Trunk Exceptions: weakness, obesity   Communication Communication Communication: No difficulties   Cognition Arousal/Alertness: Awake/alert Behavior During Therapy: WFL for tasks assessed/performed Overall Cognitive Status: Impaired/Different from baseline Area of Impairment:  Orientation, Attention, Memory, Following commands, Safety/judgement, Awareness, Problem solving                 Orientation Level: Disoriented to, Situation, Time Current Attention Level: Focused Memory: Decreased short-term memory Following Commands: Follows one step commands with increased time, Follows one step commands inconsistently Safety/Judgement: Decreased awareness of deficits Awareness: Intellectual Problem Solving: Slow processing, Decreased initiation, Difficulty sequencing, Requires verbal cues, Requires tactile cues General Comments: pt closing eyes when not stimulated, very appropriate conversation when family arrived     General Comments       Exercises     Shoulder Instructions      Home Living Family/patient expects to be discharged to:: Private residence Living Arrangements: Children;Other relatives Available Help at Discharge: Family;Available 24 hours/day Type of Home: House Home Access: Stairs to enter [employer] of Steps: 2 Entrance Stairs-Rails: None Home Layout: Two level;Laundry or work area in basement;Able to live on main level with bedroom/bathroom Alternate Therapist, sports of Steps: 12 to 15 Alternate Level Stairs-Rails: Right Bathroom Shower/Tub: Teacher, early years/pre: Handicapped height     Home Equipment: Rollator (4 wheels);Cane - single point;Shower seat;Grab bars - tub/shower          Prior Functioning/Environment Prior Level of Function : Needs assist             Mobility Comments: walks with rollator ADLs Comments: assisted for LB ADLs and all IADLs, otherwise independent        OT Problem List: Decreased strength;Decreased activity tolerance;Impaired balance (sitting and/or standing);Decreased coordination;Decreased cognition;Obesity;Impaired UE functional use      OT Treatment/Interventions: Self-care/ADL training;Therapeutic activities;Patient/family education;Balance training;DME  and/or AE instruction;Therapeutic exercise;Cognitive remediation/compensation    OT Goals(Current goals can be found in the care plan section) Acute Rehab OT Goals OT Goal Formulation: With patient Time For Goal Achievement: 05/19/22 Potential to Achieve Goals: Fair ADL Goals Pt Will Perform Eating: with mod assist;sitting Pt Will Perform Grooming: with mod assist;sitting Pt Will Perform Upper Body Dressing: with mod assist;sitting Pt/caregiver will Perform Home Exercise Program: Increased strength;Both right and left upper extremity;With minimal assist (A/AROM) Additional ADL Goal #1: Pt will perform bed mobility with moderate assistance as a precursor to ADLs. Additional ADL Goal #2: Pt demonstrate fair sitting balance x 10 minutes in preparation for ADLs. Additional ADL Goal #3: Pt will demonstrate sustained attention in preparation for ADLs.  OT Frequency: Min 2X/week    Co-evaluation              AM-PAC OT "6 Clicks" Daily Activity     Outcome Measure Help from another person eating meals?: Total Help from another person taking care of personal grooming?: Total Help from another person toileting, which includes using toliet, bedpan, or urinal?: Total Help from another person bathing (including washing, rinsing, drying)?: Total Help from another person to put on and taking off regular upper body clothing?: Total Help from another person to put on and taking off regular lower body clothing?: Total 6 Click Score: 6   End of Session Equipment Utilized During Treatment: Oxygen  Activity Tolerance: Patient tolerated treatment well Patient left: in bed;with call bell/phone within  reach;with bed alarm set;with family/visitor present  OT Visit Diagnosis: Muscle weakness (generalized) (M62.81);Other symptoms and signs involving cognitive function                Time: 1340-1405 OT Time Calculation (min): 25 min Charges:  OT General Charges $OT Visit: 1 Visit OT Evaluation $OT  Eval Moderate Complexity: 1 Mod OT Treatments $Self Care/Home Management : 8-22 mins  Cleta Alberts, OTR/L Acute Rehabilitation Services Office: [phone]   Malka So 05/05/2022, 2:38 PM

## 2022-05-06 DIAGNOSIS — I483 Typical atrial flutter: Secondary | ICD-10-CM

## 2022-05-06 DIAGNOSIS — Z515 Encounter for palliative care: Secondary | ICD-10-CM | POA: Diagnosis not present

## 2022-05-06 DIAGNOSIS — I480 Paroxysmal atrial fibrillation: Secondary | ICD-10-CM

## 2022-05-06 DIAGNOSIS — I5033 Acute on chronic diastolic (congestive) heart failure: Secondary | ICD-10-CM

## 2022-05-06 DIAGNOSIS — Z7189 Other specified counseling: Secondary | ICD-10-CM | POA: Diagnosis not present

## 2022-05-06 LAB — GLUCOSE, CAPILLARY
Glucose-Capillary: 105 mg/dL — ABNORMAL HIGH (ref 70–99)
Glucose-Capillary: 108 mg/dL — ABNORMAL HIGH (ref 70–99)
Glucose-Capillary: 112 mg/dL — ABNORMAL HIGH (ref 70–99)
Glucose-Capillary: 119 mg/dL — ABNORMAL HIGH (ref 70–99)
Glucose-Capillary: 126 mg/dL — ABNORMAL HIGH (ref 70–99)
Glucose-Capillary: 129 mg/dL — ABNORMAL HIGH (ref 70–99)

## 2022-05-06 LAB — COMPREHENSIVE METABOLIC PANEL
ALT: 37 U/L (ref 0–44)
AST: 49 U/L — ABNORMAL HIGH (ref 15–41)
Albumin: 2.4 g/dL — ABNORMAL LOW (ref 3.5–5.0)
Alkaline Phosphatase: 53 U/L (ref 38–126)
Anion gap: 9 (ref 5–15)
BUN: 37 mg/dL — ABNORMAL HIGH (ref 8–23)
CO2: 38 mmol/L — ABNORMAL HIGH (ref 22–32)
Calcium: 10.7 mg/dL — ABNORMAL HIGH (ref 8.9–10.3)
Chloride: 93 mmol/L — ABNORMAL LOW (ref 98–111)
Creatinine, Ser: 1.09 mg/dL — ABNORMAL HIGH (ref 0.44–1.00)
GFR, Estimated: 56 mL/min — ABNORMAL LOW (ref >60)
Glucose, Bld: 128 mg/dL — ABNORMAL HIGH (ref 70–99)
Potassium: 3.8 mmol/L (ref 3.5–5.1)
Sodium: 140 mmol/L (ref 135–145)
Total Bilirubin: 1.7 mg/dL — ABNORMAL HIGH (ref 0.3–1.2)
Total Protein: 8.7 g/dL — ABNORMAL HIGH (ref 6.5–8.1)

## 2022-05-06 LAB — CBC
HCT: 38.8 % (ref 36.0–46.0)
Hemoglobin: 12.3 g/dL (ref 12.0–15.0)
MCH: 31.8 pg (ref 26.0–34.0)
MCHC: 31.7 g/dL (ref 30.0–36.0)
MCV: 100.3 fL — ABNORMAL HIGH (ref 80.0–100.0)
Platelets: 161 10*3/uL (ref 150–400)
RBC: 3.87 MIL/uL (ref 3.87–5.11)
RDW: 17.2 % — ABNORMAL HIGH (ref 11.5–15.5)
WBC: 19 10*3/uL — ABNORMAL HIGH (ref 4.0–10.5)
nRBC: 0 % (ref 0.0–0.2)

## 2022-05-06 MED ORDER — AMIODARONE LOAD VIA INFUSION
150.0000 mg | Freq: Once | INTRAVENOUS | Status: AC
  Administered 2022-05-06: 150 mg via INTRAVENOUS
  Filled 2022-05-06: qty 83.34

## 2022-05-06 MED ORDER — PANTOPRAZOLE SODIUM 40 MG IV SOLR
40.0000 mg | Freq: Two times a day (BID) | INTRAVENOUS | Status: DC
  Administered 2022-05-06 – 2022-05-13 (×16): 40 mg via INTRAVENOUS
  Filled 2022-05-06 (×17): qty 10

## 2022-05-06 MED ORDER — FOLIC ACID 5 MG/ML IJ SOLN
1.0000 mg | Freq: Every day | INTRAMUSCULAR | Status: DC
  Administered 2022-05-06 – 2022-05-08 (×3): 1 mg via INTRAVENOUS
  Filled 2022-05-06 (×4): qty 0.2

## 2022-05-06 MED ORDER — POTASSIUM CHLORIDE CRYS ER 20 MEQ PO TBCR
40.0000 meq | EXTENDED_RELEASE_TABLET | Freq: Once | ORAL | Status: AC
  Administered 2022-05-06: 40 meq via ORAL
  Filled 2022-05-06: qty 2

## 2022-05-06 MED ORDER — AMIODARONE IV BOLUS ONLY 150 MG/100ML: 150.0000 mg | Freq: Once | INTRAVENOUS | Status: DC

## 2022-05-06 MED ORDER — METOCLOPRAMIDE HCL 5 MG/ML IJ SOLN: 5.0000 mg | Freq: Three times a day (TID) | INTRAMUSCULAR | Status: DC | PRN

## 2022-05-06 NOTE — Progress Notes (Signed)
Primary Cardiologist:  Mallipeddi  Subjective:  Denies SSCP, palpitations or Dyspnea Eating breakfast   Objective:  Vitals:   05/06/22 0500 05/06/22 0515 05/06/22 0530 05/06/22 0600  BP: 100/72  133/65 120/64  Pulse: (!) 130 (!) 126 78 78  Resp: 18 16 18 18   Temp:      TempSrc:      SpO2: 98% 97% 97% 97%  Weight: 104.4 kg     Height:        Intake/Output from previous day:  Intake/Output Summary (Last 24 hours) at 05/06/2022 0807 Last data filed at 05/06/2022 0600 Gross per 24 hour  Intake 782.57 ml  Output 1875 ml  Net -1092.43 ml    Physical Exam: Chronically ill COPD with oxygen HEENT: normal Neck supple with no adenopathy JVP normal no bruits no thyromegaly Lungs clear with no wheezing and good diaphragmatic motion Heart:  S1/S2 no murmur, no rub, gallop or click PMI normal Abdomen: benighn, BS positve, no tenderness, no AAA no bruit.  No HSM or HJR Distal pulses intact with no bruits Plus one edema with stasis  Neuro non-focal Skin warm and dry No muscular weakness   Lab Results: Basic Metabolic Panel: Recent Labs    05/04/22 2221 05/05/22 0359 05/06/22 0357  NA 141 141 140  K 4.9 4.4 3.8  CL 96* 99 93*  CO2 34* 35* 38*  GLUCOSE 145* 114* 128*  BUN 36* 35* 37*  CREATININE 1.14* 1.04* 1.09*  CALCIUM 10.3 10.0 10.7*  MG 2.1 2.1  --   PHOS  --  3.6  --    Liver Function Tests: Recent Labs    05/05/22 1233 05/06/22 0357  AST 49* 49*  ALT 33 37  ALKPHOS 61 53  BILITOT 1.3* 1.7*  PROT 9.5* 8.7*  ALBUMIN 2.4* 2.4*   No results for input(s): "LIPASE", "AMYLASE" in the last 72 hours. CBC: Recent Labs    05/05/22 0359 05/06/22 0357  WBC 19.0* 19.0*  HGB 12.8 12.3  HCT 39.6 38.8  MCV 99.5 100.3*  PLT 203 161     Imaging: DG ESOPHAGUS W SINGLE CM (SOL OR THIN BA)  Result Date: 05/05/2022 CLINICAL DATA:  Provided history: Dysphagia. Additional history provided: History of NASH cirrhosis, chronic respiratory failure, aspiration  pneumonia. EXAM: ESOPHAGUS/BARIUM SWALLOW/TABLET STUDY TECHNIQUE: A single contrast examination was performed using thin liquid barium. The exam was performed by Rushie Nyhan NP, and was supervised and interpreted by Dr. Kellie Simmering. FLUOROSCOPY: Fluoroscopy time: 2 minutes, 54 seconds (46.2 mGy). COMPARISON:  Modified barium swallow 05/04/2022. Esophagram 04/13/2017. FINDINGS: Limited examination due to the patient's inability to stand and limited ability to reposition on the fluoroscopy table. Additionally, the patient had difficulty drinking contrast and vomited during the examination. A barium tablet could not be administered. The very distal esophagus could not be fully distended. Elsewhere, the esophagus appears normal in caliber and smooth in contour. Moderate intermittent esophageal dysmotility with tertiary contractions. No appreciable hiatal hernia. No gastroesophageal reflux was observed. IMPRESSION: Limited examination due to the patient's inability to stand and limited ability to reposition on the fluoroscopy table. Additionally, the patient had difficulty drinking contrast and vomited during the examination. A barium tablet could not be administered. The very distal esophagus could not be fully distended, and a mild smooth distal esophageal stricture cannot be excluded. Consider a repeat examination when the patient is better able to tolerate the study. Alternatively, consider endoscopy for further evaluation. Moderate esophageal dysmotility with tertiary contractions. Electronically Signed  By: Kellie Simmering D.O.   On: 05/05/2022 10:53   DG Chest Port 1 View  Result Date: 05/05/2022 CLINICAL DATA:  Pulmonary edema EXAM: PORTABLE CHEST 1 VIEW COMPARISON:  Radiograph 05/03/2022 FINDINGS: Unchanged cardiomegaly. Left upper extremity PICC tip overlies the mid SVC. Defibrillator pads overlie the left chest. There are diffuse interstitial and alveolar opacities. There is a small right and trace  left pleural effusion. No evidence of pneumothorax. No acute osseous abnormality. Bilateral shoulder degenerative change. Thoracic spondylosis. IMPRESSION: Persistent multifocal pneumonia and/or edema with bilateral pleural effusions, right greater than left, not significant changed from prior. Electronically Signed   By: Maurine Simmering M.D.   On: 05/05/2022 08:39   DG Swallowing Func-Speech Pathology  Result Date: 05/04/2022 Table formatting from the original result was not included. Objective Swallowing Evaluation: Type of Study: MBS-Modified Barium Swallow Study  Patient Details Name: Yolanda [name] MRN: [id number] Date of Birth: [birth year] Today's Date: 05/04/2022 Time: SLP Start Time (ACUTE ONLY): 1430 -SLP Stop Time (ACUTE ONLY): 1497 SLP Time Calculation (min) (ACUTE ONLY): 17 min Past Medical History: Past Medical History: Diagnosis Date  Abnormal uterine bleeding   Anxiety   Arthritis   Chronic abdominal pain   Chronic pain in left foot   Depression   Elevated liver function tests 2018  Endometriosis   Fibromyalgia   GERD 12/21/2009  Qualifier: Diagnosis of  By: Craige Cotta    Hypertension   stopped meds in Aug 2015  Hypothyroidism   IBS (irritable bowel syndrome)   Internal hemorrhoids   Kidney stone   Liver cirrhosis secondary to NASH ([place])   Obesity, morbid ([facility])   PONV (postoperative nausea and vomiting)   Psoriatic arthritis ([place])   Pulmonary embolism ([place]) 12/2005  Qualifier: Diagnosis of  By: Kellie Simmering LPN, Almyra Free    Rheumatoid arthritis ([place])   Sleep apnea   Vitamin D deficiency  Past Surgical History: Past Surgical History: Procedure Laterality Date  ABDOMINAL SURGERY    laparoscopy  BIOPSY N/A 03/24/2014  Procedure: GASTRIC BIOPSIES;  Surgeon: Danie Binder, MD;  Location: [facility];  Service: Endoscopy;  Laterality: N/A;  BIOPSY  10/28/2019  Procedure: BIOPSY;  Surgeon: Danie Binder, MD;  Location: [facility];  Service: Endoscopy;;  gastric nodule  BREAST LUMPECTOMY Right   CESAREAN SECTION     X2  COLONOSCOPY  2006  internal hemorrhoids  COLONOSCOPY WITH PROPOFOL N/A 03/24/2014  Dr. [name]: 2 tubular adenomas removed, hemorrhoids  COLONOSCOPY WITH PROPOFOL N/A 10/28/2019  Fields: External and internal hemorrhoids, 8 polyps ranging from 2 to 5 mm in size removed from the colon.  Multiple tubular adenomas.  Next colonoscopy in 3 years.  CYSTOSCOPY W/ RETROGRADES  01/23/2012  Procedure: CYSTOSCOPY WITH RETROGRADE PYELOGRAM;  Surgeon: Marissa Nestle, MD;  Location: [facility];  Service: Urology;  Laterality: Left;  DILATATION & CURETTAGE/HYSTEROSCOPY WITH MYOSURE N/A 11/18/2014  Procedure: DILATATION & CURETTAGE/HYSTEROSCOPY WITH MYOSURE, resection of polyp;  Surgeon: Cheri Fowler, MD;  Location: [facility];  Service: Gynecology;  Laterality: N/A;  DILATION AND CURETTAGE OF UTERUS    ESOPHAGOGASTRODUODENOSCOPY   08/24/2006  Dr. Veto Kemps erythema of the antrum without erosion or ulcers/Otherwise, normal esophagus without evidence of Barrett's path with chronic gastritis  ESOPHAGOGASTRODUODENOSCOPY (EGD) WITH PROPOFOL N/A 03/24/2014  Dr. [name]: gastritis  ESOPHAGOGASTRODUODENOSCOPY (EGD) WITH PROPOFOL N/A 10/28/2019  Fields: Esophagus appeared normal, empiric dilation due to history of dysphagia.  6 mm nodule seen in the gastric cardia.  Mild portal hypertensive gastropathy.  Nodule from  the stomach biopsied and showed mild chronic gastritis, no H. pylori.  POLYPECTOMY N/A 03/24/2014  Procedure: POLYPECTOMY;  Surgeon: Danie Binder, MD;  Location: [facility];  Service: Endoscopy;  Laterality: N/A;  POLYPECTOMY  10/28/2019  Procedure: POLYPECTOMY;  Surgeon: Danie Binder, MD;  [facility];  Service: Endoscopy;;  hepatic flexure, ascending colon,sigmoid colon, rectal  REMOVAL OF STONES  01/23/2012  Procedure: REMOVAL OF STONES;  Surgeon: Marissa Nestle, MD;  Location: [facility];  Service: Urology;  Laterality: N/A;  SAVORY DILATION N/A 10/28/2019  Procedure: SAVORY DILATION;  Surgeon: Danie Binder, MD;   Location: [facility];  Service: Endoscopy;  Laterality: N/A;  TUBAL LIGATION   HPI: Pt is a 66 y.o. female who presented with SOB and fatigue. CXR on admission revealed multifocal pneumonia. ETT 10/22-10/25. Family reported to ordering MD that pt has a fairly significant history of poorly controlled reflux, choking after meals, and at times choking waking up at night. Plan for esopghagram when pt is medically stable. Pt recently admitted at [facility] 10/11-10/19 with AMS and acute cystitis. Pt found to have new onset Afib, multi-focal PNA and effusion and was diuresed and discharged home. PMH: cirrhosis secondary to NASH with recent hepatic encephalopathy, PAF not on anticoagulation, chronic HFpEF, recent multifocal pneumonia, hypothyroidism, GERD, morbid obesity, COPD Gold stage II, esophagram 04/13/17 WNL. EGD 10/28/19: Esophagus appeared normal, empiric dilation due to history of dysphagia.  6 mm nodule seen in the gastric cardia.  Mild portal hypertensive gastropathy.  Nodule from the stomach biopsied and showed mild chronic gastritis, no H. pylori.  No data recorded  Recommendations for follow up therapy are one component of a multi-disciplinary discharge planning process, led by the attending physician.  Recommendations may be updated based on patient status, additional functional criteria and insurance authorization. Assessment / Plan / Recommendation   05/04/2022   3:34 PM Clinical Impressions Clinical Impression Pt's oropharyngeal swallow mechanism was within functional limits with minimal vallecular and pyriform sinus residue which was cleared with a liquid wash or with pt's independent use of secondary swallows. An episode of regurgitation (thoracic esophagus to hyopharynx) was noted when pt swallowed the 14m barium tablet with thin liquids . Aspiration was not observed during this episode, but based on reports from the pt and her daughter, and the pt's pneumonia, SLP questions whether these episodes  intermittently result in aspiration. Esophageal visualization revealed slowed movement of solids in the upper thoracic esophagus and halted movement of the barium tablet at the GE junction which was facilitated with an additional bolus of thin liquids. SLP is in agreement with esophageal assessment. Pt may have up to regular texture solids and thin liquids from an oropharyngeal standpoint; pt's case discussed with PCCM (i.e., Dr. STamala Julianand PSalvadore Dom NP) and it was agreed to advance pt's diet. SLP will follow briefly to ensure tolerance of the advanced diet. SLP Visit Diagnosis Dysphagia, unspecified (R13.10) Impact on safety and function Mild aspiration risk     05/04/2022   3:34 PM Treatment Recommendations Treatment Recommendations No treatment recommended at this time     05/04/2022   3:34 PM Prognosis Barriers to Reach Goals Time post onset;Severity of deficits   05/04/2022   3:34 PM Diet Recommendations SLP Diet Recommendations Regular solids;Thin liquid Liquid Administration via Cup;Straw Medication Administration Whole meds with puree Compensations Slow rate;Small sips/bites Postural Changes Seated upright at 90 degrees;Remain semi-upright after after feeds/meals (Comment)     05/04/2022   3:34 PM Other  Recommendations Recommended Consults Consider esophageal assessment Oral Care Recommendations Oral care BID Follow Up Recommendations No SLP follow up Functional Status Assessment Patient has had a recent decline in their functional status and demonstrates the ability to make significant improvements in function in a reasonable and predictable amount of time.   05/03/2022   3:30 PM Frequency and Duration  Speech Therapy Frequency (ACUTE ONLY) min 2x/week Treatment Duration 2 weeks     05/04/2022   3:34 PM Oral Phase Oral Phase [facility]    05/04/2022   3:34 PM Pharyngeal Phase Pharyngeal Phase [facility]    05/04/2022   3:34 PM Cervical Esophageal Phase  Cervical Esophageal Phase [facility] Shanika I. Hardin Negus, [place], [place] Office number [phone] Horton Marshall 05/04/2022, 4:10 PM                      Cardiac Studies:  ECG: 10/25 SR rate 95 normal    Telemetry:  NSR 05/06/2022   Echo: 10/16  EF 70-75% normal RV trivial MR AV sclerosis   Medications:    apixaban  5 mg Oral BID   budesonide (PULMICORT) nebulizer solution  0.5 mg Nebulization BID   Chlorhexidine Gluconate Cloth  6 each Topical Daily   DULoxetine  60 mg Oral Daily   folic acid  1 mg Oral Daily   furosemide  40 mg Intravenous BID   guaiFENesin  15 mL Per Tube Q6H   insulin aspart  0-15 Units Subcutaneous Q4H   lactulose  20 g Oral BID   levothyroxine  75 mcg Oral Q0600   metoprolol tartrate  25 mg Oral BID   multivitamin with minerals  1 tablet Oral Daily   pantoprazole  40 mg Oral BID   pilocarpine  5 mg Oral BID   predniSONE  20 mg Oral Q breakfast   rifaximin  550 mg Oral BID   rosuvastatin  10 mg Oral Daily   spironolactone  50 mg Oral Daily      sodium chloride Stopped (04/30/22 0410)   amiodarone 30 mg/hr (05/06/22 0600)   piperacillin-tazobactam (ZOSYN)  IV Stopped (05/06/22 0539)    Assessment/Plan:   Atrial flutter:  converted with amiodarone continue iv today and change to PO in am Continue Eliquis for anticoagulation  COPD/:  chronic respiratory failure improved sats ok On Zosyn and nebs with 20 mg prednisone daily Normally on 3L's oxygen at home Thyroid:  continue synthroid replacement Follow closely on amiodarone    DNR/DNI  Jenkins Rouge 05/06/2022, 8:07 AM

## 2022-05-06 NOTE — Progress Notes (Addendum)
PROGRESS NOTE    Yolanda [name]  QMK:[id number] DOB: 16-May-1956 DOA: 04/28/2022 PCP: Celene Squibb, MD  65/F chronically ill with [name] cirrhosis, diastolic CHF, obesity, PE not on AC, A-fib not on AC, chronic respiratory failure on 3L [place] O2,  OSA, hypertension, anxiety, depression, GERD, rheumatoid arthritis, hypothyroidism.  She was recently admitted at [facility] 10/11-10/19 with AMS and acute cystitis.   Work-up was negative for PE, found to have new onset Afib, multi-focal PNA and effusion and was diuresed and discharged home on 10/19 on antibiotics. -Return to the ED 10/20 with worsening shortness of breath and hypoxia, placed on BiPAP, CXR again showed multi-focal bilateral infiltrates, restarted antibiotics and diuretics on 10/22 her respiratory status began to decline despite bipap and PCCM was consulted for transfer to the ICU 10/22 intubated 10/25: Extubated, remained encephalopathic 10/26: started on amio gtt for Afib/RVR Transferred to [facility] service 10/27   Subjective: -More awake and interactive, overnight with rapid A-fib, given a bolus of IV amiodarone  Assessment and Plan:  Acute on Chronic Hypoxic Respiratory Failure secondary to Multifocal PNA: Aspiration Acute on chronic HFpEF with pulmonary edema -Extubated 10/25 -Concern for recurrent aspiration, possibly esophageal, SLP eval noted -Day 7 of IV Zosyn, leukocytosis unchanged, DC antibiotics after today's dose -Barium esophagram attempted yesterday, could not be completed-patient was vomiting could not tolerate study, reattempt on Monday -Aspiration precautions -Pulmonary toilet, wean O2 as tolerated -DC foley  Acute on chronic diastolic CHF -Echo 86/77 with EF of 37-36%, grade 1 diastolic dysfunction, preserved RV function -Continue IV Lasix 1 more day, continue Aldactone -She is 12.9 L negative -Albumin is low at baseline with liver disease, transition to torsemide in 1 to 2 days   Atrial Fibrillation with RVR   -Required IV amiodarone bolus last night and few nights ago -Continue Lopressor and Eliquis -Now on Amio gtt., cardiology consulting, replete potassium, check mag   hypokalemia -Replaced   Acute metabolic encephalopathy.   -Suspect mixed picture of post infection, also hepatic encephalopathic component -Improving, completing antibiotic course, repeat ammonia level was normal yesterday  -Check ABG tomorrow a.m.   NASH liver cirrhosis  -Continue rifaximin, lactulose Continue spironolactone   Type 2 DM -Holding metformin continue sliding scale insulin   Hypothyroidism -Continue levothyroxine   GERD -Esophagram PPI and Carafate  Class 3 obesity (HCC) Calculated BMI is 44.3 Patient with very poor physical functional status, uses a walker for ambulation.   Depression and anxiety,  Continue with daily duloxetine.  Discontinued alprazolam.   DVT prophylaxis: Eliquis Code Status: DNR Family Communication: Family at bedside, updated daughter [name] yesterday Disposition Plan: Transfer to progressive  Consultants: PCCM   Procedures:   Antimicrobials:    Objective: Vitals:   05/06/22 0515 05/06/22 0530 05/06/22 0600 05/06/22 0812  BP:  133/65 120/64   Pulse: (!) 126 78 78   Resp: _0 Temp:    97.9 F (36.6 C)  TempSrc:    Oral  SpO2: 97% 97% 97%   Weight:      Height:        Intake/Output Summary (Last 24 hours) at 05/06/2022 0902 Last data filed at 05/06/2022 0600 Gross per 24 hour  Intake 782.57 ml  Output 1875 ml  Net -1092.43 ml   Filed Weights   05/04/22 0500 05/05/22 0403 05/06/22 0500  Weight: 106.1 kg 104.1 kg 104.4 kg    Examination:  General exam: Obese chronically ill female appears older than stated age, awake alert, oriented x3  today HEENT: Positive JVD CVS: S1-S2, regular rhythm Lungs: Few scattered rhonchi at the bases, improving air movement Abdomen: Soft, obese, nontender, bowel sounds present Extremities: No edema Neuro:  Moves all extremities, no localizing signs Skin: No rashes Psychiatry:  Mood & affect appropriate.     Data Reviewed:   CBC: Recent Labs  Lab 05/02/22 0456 05/02/22 2563 05/03/22 0249 05/04/22 0425 05/05/22 0359 05/06/22 0357  WBC 4.9  --  7.4 16.3* 19.0* 19.0*  HGB 10.1* 10.5* 10.3* 12.5 12.8 12.3  HCT 31.8* 31.0* 32.8* 39.5 39.6 38.8  MCV 100.6*  --  101.5* 101.5* 99.5 100.3*  PLT 139*  --  132* 206 203 893   Basic Metabolic Panel: Recent Labs  Lab 05/02/22 0456 05/02/22 0614 05/02/22 1704 05/03/22 0249 05/03/22 1615 05/03/22 2216 05/04/22 0425 05/04/22 2221 05/05/22 0359 05/06/22 0357  NA 145   < >  --  149*   < > 143 141 141 141 140  K 3.2*   < >  --  3.4*   < > 3.6 3.1* 4.9 4.4 3.8  CL 106  --   --  108   < > 98 97* 96* 99 93*  CO2 33*  --   --  35*   < > 33* 35* 34* 35* 38*  GLUCOSE 149*  --   --  142*   < > 118* 103* 145* 114* 128*  BUN 40*  --   --  41*   < > 33* 32* 36* 35* 37*  CREATININE 0.90  --   --  0.86   < > 1.12* 0.89 1.14* 1.04* 1.09*  CALCIUM 9.0  --   --  9.7   < > 10.5* 10.4* 10.3 10.0 10.7*  MG 1.6*  --   --  2.4  --  1.9 1.8 2.1 2.1  --   PHOS 2.2*  --  4.9* 2.4*  --   --   --   --  3.6  --    < > = values in this interval not displayed.   GFR: Estimated Creatinine Clearance: 59 mL/min (A) (by C-G formula based on SCr of 1.09 mg/dL (H)). Liver Function Tests: Recent Labs  Lab 05/02/22 0456 05/03/22 1615 05/05/22 1233 05/06/22 0357  AST 34 38 49* 49*  ALT 19 23 33 37  ALKPHOS 50 54 61 53  BILITOT 1.1 1.3* 1.3* 1.7*  PROT 9.6* 9.8* 9.5* 8.7*  ALBUMIN 2.7* 2.7* 2.4* 2.4*   No results for input(s): "LIPASE", "AMYLASE" in the last 168 hours. Recent Labs  Lab 05/02/22 0456 05/03/22 0249 05/05/22 0815  AMMONIA 64* 23 28   Coagulation Profile: Recent Labs  Lab 05/02/22 0456 05/04/22 0425  INR 1.7* 1.8*   Cardiac Enzymes: No results for input(s): "CKTOTAL", "CKMB", "CKMBINDEX", "TROPONINI" in the last 168 hours. BNP (last  3 results) No results for input(s): "PROBNP" in the last 8760 hours. HbA1C: No results for input(s): "HGBA1C" in the last 72 hours. CBG: Recent Labs  Lab 05/05/22 1559 05/05/22 2012 05/05/22 2346 05/06/22 0356 05/06/22 0810  GLUCAP 133* 134* 109* 108* 126*   Lipid Profile: No results for input(s): "CHOL", "HDL", "LDLCALC", "TRIG", "CHOLHDL", "LDLDIRECT" in the last 72 hours. Thyroid Function Tests: No results for input(s): "TSH", "T4TOTAL", "FREET4", "T3FREE", "THYROIDAB" in the last 72 hours. Anemia Panel: No results for input(s): "VITAMINB12", "FOLATE", "FERRITIN", "TIBC", "IRON", "RETICCTPCT" in the last 72 hours. Urine analysis:    Component Value Date/Time   COLORURINE YELLOW 04/22/2022 1430  APPEARANCEUR CLEAR 04/22/2022 1430   APPEARANCEUR Clear 03/08/2022 1549   LABSPEC 1.016 04/22/2022 1430   PHURINE 5.0 04/22/2022 1430   GLUCOSEU NEGATIVE 04/22/2022 1430   HGBUR SMALL (A) 04/22/2022 1430   BILIRUBINUR NEGATIVE 04/22/2022 1430   BILIRUBINUR Negative 03/08/2022 1549   KETONESUR NEGATIVE 04/22/2022 1430   PROTEINUR NEGATIVE 04/22/2022 1430   UROBILINOGEN negative (A) 07/02/2019 0932   UROBILINOGEN 0.2 04/15/2015 0028   NITRITE NEGATIVE 04/22/2022 1430   LEUKOCYTESUR SMALL (A) 04/22/2022 1430   Sepsis Labs: _0 (procalcitonin:4,lacticidven:4)  ) Recent Results (from the past 240 hour(s))  Culture, BAL-quantitative w Gram Stain     Status: None   Collection Time: 04/30/22  3:36 PM   Specimen: Bronchoalveolar Lavage; Respiratory  Result Value Ref Range Status   Specimen Description BRONCHIAL ALVEOLAR LAVAGE  Final   Special Requests Normal  Final   Gram Stain   Final    RARE WBC PRESENT, PREDOMINANTLY PMN NO ORGANISMS SEEN    Culture   Final    NO GROWTH 2 DAYS Performed at [facility], [place] [address] [place], [place] [address]    Report Status 05/02/2022 FINAL  Final  MRSA Next Gen by PCR, Nasal     Status: None   Collection Time:  05/01/22  4:11 AM   Specimen: Nasal Mucosa; Nasal Swab  Result Value Ref Range Status   MRSA by PCR Next Gen NOT DETECTED NOT DETECTED Final    Comment: (NOTE) The GeneXpert MRSA Assay (FDA approved for NASAL specimens only), is one component of a comprehensive MRSA colonization surveillance program. It is not intended to diagnose MRSA infection nor to guide or monitor treatment for MRSA infections. Test performance is not FDA approved in patients less than 67 years old. Performed at [facility], [place] [address] [place], [place] [address]      Radiology Studies: DG ESOPHAGUS W SINGLE CM (SOL OR THIN BA)  Result Date: 05/05/2022 CLINICAL DATA:  Provided history: Dysphagia. Additional history provided: History of NASH cirrhosis, chronic respiratory failure, aspiration pneumonia. EXAM: ESOPHAGUS/BARIUM SWALLOW/TABLET STUDY TECHNIQUE: A single contrast examination was performed using thin liquid barium. The exam was performed by Rushie Nyhan NP, and was supervised and interpreted by Dr. Kellie Simmering. FLUOROSCOPY: Fluoroscopy time: 2 minutes, 54 seconds (46.2 mGy). COMPARISON:  Modified barium swallow 05/04/2022. Esophagram 04/13/2017. FINDINGS: Limited examination due to the patient's inability to stand and limited ability to reposition on the fluoroscopy table. Additionally, the patient had difficulty drinking contrast and vomited during the examination. A barium tablet could not be administered. The very distal esophagus could not be fully distended. Elsewhere, the esophagus appears normal in caliber and smooth in contour. Moderate intermittent esophageal dysmotility with tertiary contractions. No appreciable hiatal hernia. No gastroesophageal reflux was observed. IMPRESSION: Limited examination due to the patient's inability to stand and limited ability to reposition on the fluoroscopy table. Additionally, the patient had difficulty drinking contrast and vomited during the examination.  A barium tablet could not be administered. The very distal esophagus could not be fully distended, and a mild smooth distal esophageal stricture cannot be excluded. Consider a repeat examination when the patient is better able to tolerate the study. Alternatively, consider endoscopy for further evaluation. Moderate esophageal dysmotility with tertiary contractions. Electronically Signed   By: Kellie Simmering D.O.   On: 05/05/2022 10:53   DG Chest Port 1 View  Result Date: 05/05/2022 CLINICAL DATA:  Pulmonary edema EXAM: PORTABLE CHEST 1 VIEW COMPARISON:  Radiograph 05/03/2022  FINDINGS: Unchanged cardiomegaly. Left upper extremity PICC tip overlies the mid SVC. Defibrillator pads overlie the left chest. There are diffuse interstitial and alveolar opacities. There is a small right and trace left pleural effusion. No evidence of pneumothorax. No acute osseous abnormality. Bilateral shoulder degenerative change. Thoracic spondylosis. IMPRESSION: Persistent multifocal pneumonia and/or edema with bilateral pleural effusions, right greater than left, not significant changed from prior. Electronically Signed   By: Maurine Simmering M.D.   On: 05/05/2022 08:39   DG Swallowing Func-Speech Pathology  Result Date: 05/04/2022 Table formatting from the original result was not included. Objective Swallowing Evaluation: Type of Study: MBS-Modified Barium Swallow Study  Patient Details Name: GIRTIE WIERSMA MRN: [id number] Date of Birth: 1956/06/30 Today's Date: 05/04/2022 Time: SLP Start Time (ACUTE ONLY): 1430 -SLP Stop Time (ACUTE ONLY): 6578 SLP Time Calculation (min) (ACUTE ONLY): 17 min Past Medical History: Past Medical History: Diagnosis Date  Abnormal uterine bleeding   Anxiety   Arthritis   Chronic abdominal pain   Chronic pain in left foot   Depression   Elevated liver function tests 2018  Endometriosis   Fibromyalgia   GERD 12/21/2009  Qualifier: Diagnosis of  By: Craige Cotta    Hypertension   stopped meds in Aug  2015  Hypothyroidism   IBS (irritable bowel syndrome)   Internal hemorrhoids   Kidney stone   Liver cirrhosis secondary to NASH ([place])   Obesity, morbid (HCC)   PONV (postoperative nausea and vomiting)   Psoriatic arthritis ([place])   Pulmonary embolism ([place]) 12/2005  Qualifier: Diagnosis of  By: Kellie Simmering LPN, Almyra Free    Rheumatoid arthritis ([place])   Sleep apnea   Vitamin D deficiency  Past Surgical History: Past Surgical History: Procedure Laterality Date  ABDOMINAL SURGERY    laparoscopy  BIOPSY N/A 03/24/2014  Procedure: GASTRIC BIOPSIES;  Surgeon: Danie Binder, MD;  Location: [facility] ORS;  Service: Endoscopy;  Laterality: N/A;  BIOPSY  10/28/2019  Procedure: BIOPSY;  Surgeon: Danie Binder, MD;  Location: [facility] END[facility] SU[facility];  Service: Endoscopy;;  gastric nodule  BREAST LUMPECTOMY Right   CESAREAN SECTION    X2  COLONOSCOPY  2006  internal hemorrhoids  COLONOSCOPY WITH PROPOFOL N/A 03/24/2014  Dr. [name]: 2 tubular adenomas removed, hemorrhoids  COLONOSCOPY WITH PROPOFOL N/A 10/28/2019  Fields: External and internal hemorrhoids, 8 polyps ranging from 2 to 5 mm in size removed from the colon.  Multiple tubular adenomas.  Next colonoscopy in 3 years.  CYSTOSCOPY W/ RETROGRADES  01/23/2012  Procedure: CYSTOSCOPY WITH RETROGRADE PYELOGRAM;  Surgeon: Marissa Nestle, MD;  Location: [facility];  Service: Urology;  Laterality: Left;  DILATATION & CURETTAGE/HYSTEROSCOPY WITH MYOSURE N/A 11/18/2014  Procedure: DILATATION & CURETTAGE/HYSTEROSCOPY WITH MYOSURE, resection of polyp;  Surgeon: Cheri Fowler, MD;  Location: [place] [facility];  Service: Gynecology;  Laterality: N/A;  DILATION AND CURETTAGE OF UTERUS    ESOPHAGOGASTRODUODENOSCOPY   08/24/2006  Dr. Veto Kemps erythema of the antrum without erosion or ulcers/Otherwise, normal esophagus without evidence of Barrett's path with chronic gastritis  ESOPHAGOGASTRODUODENOSCOPY (EGD) WITH PROPOFOL N/A 03/24/2014  Dr. [name]: gastritis  ESOPHAGOGASTRODUODENOSCOPY (EGD) WITH PROPOFOL N/A 10/28/2019   Fields: Esophagus appeared normal, empiric dilation due to history of dysphagia.  6 mm nodule seen in the gastric cardia.  Mild portal hypertensive gastropathy.  Nodule from the stomach biopsied and showed mild chronic gastritis, no H. pylori.  POLYPECTOMY N/A 03/24/2014  Procedure: POLYPECTOMY;  Surgeon: Danie Binder, MD;  Location: [facility];  Service: Endoscopy;  Laterality: N/A;  POLYPECTOMY  10/28/2019  Procedure: POLYPECTOMY;  Surgeon: Danie Binder, MD;  Location: [facility];  Service: Endoscopy;;  hepatic flexure, ascending colon,sigmoid colon, rectal  REMOVAL OF STONES  01/23/2012  Procedure: REMOVAL OF STONES;  Surgeon: Marissa Nestle, MD;  Location: [facility];  Service: Urology;  Laterality: N/A;  SAVORY DILATION N/A 10/28/2019  Procedure: SAVORY DILATION;  Surgeon: Danie Binder, MD;  Location: [facility];  Service: Endoscopy;  Laterality: N/A;  TUBAL LIGATION   HPI: Pt is a 66 y.o. female who presented with SOB and fatigue. CXR on admission revealed multifocal pneumonia. ETT 10/22-10/25. Family reported to ordering MD that pt has a fairly significant history of poorly controlled reflux, choking after meals, and at times choking waking up at night. Plan for esopghagram when pt is medically stable. Pt recently admitted at [facility] 10/11-10/19 with AMS and acute cystitis. Pt found to have new onset Afib, multi-focal PNA and effusion and was diuresed and discharged home. PMH: cirrhosis secondary to NASH with recent hepatic encephalopathy, PAF not on anticoagulation, chronic HFpEF, recent multifocal pneumonia, hypothyroidism, GERD, morbid obesity, COPD Gold stage II, esophagram 04/13/17 WNL. EGD 10/28/19: Esophagus appeared normal, empiric dilation due to history of dysphagia.  6 mm nodule seen in the gastric cardia.  Mild portal hypertensive gastropathy.  Nodule from the stomach biopsied and showed mild chronic gastritis, no H. pylori.  No data recorded  Recommendations for follow up therapy are one component  of a multi-disciplinary discharge planning process, led by the attending physician.  Recommendations may be updated based on patient status, additional functional criteria and insurance authorization. Assessment / Plan / Recommendation   05/04/2022   3:34 PM Clinical Impressions Clinical Impression Pt's oropharyngeal swallow mechanism was within functional limits with minimal vallecular and pyriform sinus residue which was cleared with a liquid wash or with pt's independent use of secondary swallows. An episode of regurgitation (thoracic esophagus to hyopharynx) was noted when pt swallowed the 16m barium tablet with thin liquids . Aspiration was not observed during this episode, but based on reports from the pt and her daughter, and the pt's pneumonia, SLP questions whether these episodes intermittently result in aspiration. Esophageal visualization revealed slowed movement of solids in the upper thoracic esophagus and halted movement of the barium tablet at the GE junction which was facilitated with an additional bolus of thin liquids. SLP is in agreement with esophageal assessment. Pt may have up to regular texture solids and thin liquids from an oropharyngeal standpoint; pt's case discussed with PCCM (i.e., Dr. STamala Julianand PSalvadore Dom NP) and it was agreed to advance pt's diet. SLP will follow briefly to ensure tolerance of the advanced diet. SLP Visit Diagnosis Dysphagia, unspecified (R13.10) Impact on safety and function Mild aspiration risk     05/04/2022   3:34 PM Treatment Recommendations Treatment Recommendations No treatment recommended at this time     05/04/2022   3:34 PM Prognosis Barriers to Reach Goals Time post onset;Severity of deficits   05/04/2022   3:34 PM Diet Recommendations SLP Diet Recommendations Regular solids;Thin liquid Liquid Administration via Cup;Straw Medication Administration Whole meds with puree Compensations Slow rate;Small sips/bites Postural Changes Seated upright at 90  degrees;Remain semi-upright after after feeds/meals (Comment)     05/04/2022   3:34 PM Other Recommendations Recommended Consults Consider esophageal assessment Oral Care Recommendations Oral care BID Follow Up Recommendations No SLP follow up Functional Status Assessment Patient has had a recent decline in their functional status and demonstrates the  ability to make significant improvements in function in a reasonable and predictable amount of time.   05/03/2022   3:30 PM Frequency and Duration  Speech Therapy Frequency (ACUTE ONLY) min 2x/week Treatment Duration 2 weeks     05/04/2022   3:34 PM Oral Phase Oral Phase [facility]    05/04/2022   3:34 PM Pharyngeal Phase Pharyngeal Phase [facility]    05/04/2022   3:34 PM Cervical Esophageal Phase  Cervical Esophageal Phase [facility] Shanika I. Hardin Negus, [place], [place] Office number [phone] Horton Marshall 05/04/2022, 4:10 PM                       Scheduled Meds:  apixaban  5 mg Oral BID   budesonide (PULMICORT) nebulizer solution  0.5 mg Nebulization BID   Chlorhexidine Gluconate Cloth  6 each Topical Daily   DULoxetine  60 mg Oral Daily   folic acid  1 mg Oral Daily   furosemide  40 mg Intravenous BID   guaiFENesin  15 mL Per Tube Q6H   insulin aspart  0-15 Units Subcutaneous Q4H   lactulose  20 g Oral BID   levothyroxine  75 mcg Oral Q0600   metoprolol tartrate  25 mg Oral BID   multivitamin with minerals  1 tablet Oral Daily   pantoprazole  40 mg Oral BID   pilocarpine  5 mg Oral BID   predniSONE  20 mg Oral Q breakfast   rifaximin  550 mg Oral BID   rosuvastatin  10 mg Oral Daily   spironolactone  50 mg Oral Daily   Continuous Infusions:  sodium chloride Stopped (04/30/22 0410)   amiodarone 30 mg/hr (05/06/22 0600)   piperacillin-tazobactam (ZOSYN)  IV Stopped (05/06/22 0539)     LOS: 8 days    Time spent: 61mn    PDomenic Polite MD [facility]   05/06/2022, 9:02 AM

## 2022-05-06 NOTE — Progress Notes (Signed)
Overnight progress note  Notified by RN that patient was in sinus rhythm throughout the night and at 4 AM this morning went back into A-fib with RVR, rate fluctuating between 150s to 180s.  Blood pressure at 3 AM was 140/63 but now 107/74.  Respiratory rate 19, SPO2 97% on 3 L O2, temperature 97.7 F.  Chart reviewed.  Patient is a 66 year old female with history of [name] cirrhosis, HFpEF, PE, A-fib previously not on anticoagulation, chronic respiratory failure on 3 L home oxygen, OSA, hypertension, type 2 diabetes, rheumatoid arthritis, hypothyroidism admitted for acute on chronic hypoxic respiratory failure secondary to multifocal pneumonia/aspiration, acute on chronic HFpEF, A-fib with RVR, acute metabolic encephalopathy. Initially in the ICU and was extubated on 10/25 and transferred to [facility] service on 10/27.  She is currently on Zosyn, IV Lasix 40 mg twice daily, Eliquis, metoprolol 25 mg twice daily, and IV amiodarone infusion.  Seen by palliative care and she is DNR/DNI.  Echo done 04/24/2022 showing EF 70 to 02%, grade 1 diastolic dysfunction, preserved RV function.  I have consulted cardiology (Dr. Kalman Shan).  Recommending giving amiodarone bolus 150 mg over 30 minutes (discussed with RN).  Cardioversion might be considered since she is now anticoagulated.  Cardiology will see the patient and make further recommendations.

## 2022-05-06 NOTE — Progress Notes (Signed)
   Palliative Medicine Inpatient Follow Up Note HPI: 65/F chronically ill with [name] cirrhosis, diastolic CHF, obesity, PE not on AC, A-fib not on AC, chronic respiratory failure on 3L [place] O2,  OSA, hypertension, anxiety, depression, GERD, rheumatoid arthritis, hypothyroidism.  She was recently admitted at [facility] 10/11-10/19 and readmitted on 10/20 with MF PNA. Seen   Palliative care saw Yolanda [name] on 10/17 and 10/18. Given recurrent admission and intubation, we have been asked to see here for further goals of care conversations.   Today's Discussion 05/06/2022  *Please note that this is a verbal dictation therefore any spelling or grammatical errors are due to the "[place] One" system interpretation.  Chart reviewed inclusive of vital signs, progress notes, laboratory results, and diagnostic images.   I met with Yolanda [name] at bedside this morning. She shares that she is feeling better this morning and had a more restful night. We reviewed her present health-state. She endorses that she feels like something is "stuck at the back of her throat". She is coughing and endorses bringing up more sputum. We talked about regular pulmonary toileting for relief. Regarding eating and drinking, Yolanda [name] vocalizes that food taste more sour and it "hurts" when she swallows. We discussed the inflammation and trauma her throat endured as a result of intubation. She also complains of intermittent nausea which "comes and goes". Discussed the use of an antiemetic for this.  Patient set goals for the day of trying to get out of bed and have a bowel movement.  We discussed again that her wishes are to not receive resuscitation or re-intubation.   Questions and concerns addressed/Palliative Support Provided.   Objective Assessment: Vital Signs Vitals:   05/06/22 0600 05/06/22 0812  BP: 120/64   Pulse: 78   Resp: 18   Temp:  97.9 F (36.6 C)  SpO2: 97%     Intake/Output Summary (Last 24 hours) at 05/06/2022  2957 Last data filed at 05/06/2022 0600 Gross per 24 hour  Intake 782.57 ml  Output 1875 ml  Net -1092.43 ml   Last Weight  Most recent update: 05/06/2022  5:12 AM    Weight  104.4 kg (230 lb 2.6 oz)            Gen:  Elderly caucasian F in NAD HEENT: moist mucous membranes CV: Regular rate and rhythm  PULM: On 3LPM [place] breathing is even and nonlabored ABD: soft/nontender  EXT: (+) edema  Neuro: Alert and oriented x2-3  SUMMARY OF RECOMMENDATIONS   DNAR/DNI   Chaplain support for advance directives   Goals are for improvement at this time --> Patient would like to get OOB today  Pulmonary toileting Q4H   Continue Zofran PRN, added reglan Q8H PRN in addition  Appreciate speech therapy involvement  PT/OT for generalized weakness   Continue current care  Billing based on MDM: High __________________________________________________________________________ [place] Team Team Cell Phone: [phone] Please utilize secure chat with additional questions, if there is no response within 30 minutes please call the above phone number  Palliative Medicine Team providers are available by phone from 7am to 7pm daily and can be reached through the team cell phone.  Should this patient require assistance outside of these hours, please call the patient's attending physician.

## 2022-05-06 NOTE — Consult Note (Signed)
Cardiology Consult    Patient ID: Yolanda [name] MRN: [id number], DOB/AGE: May 14, 1956   Admit date: 04/28/2022 Date of Consult: 05/06/2022 Requesting Provider: Shela Leff, MD   PCP:  Celene Squibb, MD   [facility] Providers Cardiologist:  Seen by Dr. Dellia Cloud inpatient and scheduled for follow-up    Patient Profile    Yolanda [name]  is a 66 year old female with history of [name] cirrhosis, HFpEF, PE, paroxysmal AF - not on anticoagulation, chronic respiratory failure on 3 L home oxygen, OSA, hypertension, type 2 diabetes, rheumatoid arthritis, hypothyroidism. Cardiology consulted for AF with RVR.  History of Present Illness   AF was diagnosed during another recent admission earlier this month for hepatic encephalopathy and pneumonia. Echo done 04/24/2022 showing EF 70 to 75%, preserved RV function, mild LA dilation, no significant valvular disease. Per notes from last admission (she was out of the hospital for just 24 hours before readmission), daughter stated that patient had 4 falls since June of this year and hence was not comfortable with any anticoagulation for stroke prevention. At discharge she was referred to EP to consider Watchman. She was sent home on cardizem and metoprolol, in sinus rhythm after converting while on diltiazem infusion.    Re-admitted for acute on chronic hypoxic respiratory failure secondary to multifocal pneumonia/aspiration, acute on chronic HFpEF, A-fib with RVR, acute metabolic encephalopathy. Initially in the ICU and was extubated on 10/25 and transferred to [facility] service on 10/27.  She is currently on IV Lasix 40 mg twice daily, Eliquis started 2 days ago, metoprolol 25 mg twice daily, and IV amiodarone infusion.  Seen by palliative care and she is DNR/DNI. She had been back in sinus rhythm until 4am tonight when she converted back to AF with RVR - rates in 150s to 170s. BP came down some but she remained normotensive. Eliquis has now  been started. Cardiology consulted for further recommendations.  Past Medical History   Past Medical History:  Diagnosis Date   Abnormal uterine bleeding    Anxiety    Arthritis    Chronic abdominal pain    Chronic pain in left foot    Depression    Elevated liver function tests 2018   Endometriosis    Fibromyalgia    GERD 12/21/2009   Qualifier: Diagnosis of  By: Craige Cotta     Hypertension    stopped meds in Aug 2015   Hypothyroidism    IBS (irritable bowel syndrome)    Internal hemorrhoids    Kidney stone    Liver cirrhosis secondary to NASH (HCC)    Obesity, morbid (HCC)    PONV (postoperative nausea and vomiting)    Psoriatic arthritis ([place])    Pulmonary embolism ([place]) 12/2005   Qualifier: Diagnosis of  By: Kellie Simmering LPN, Almyra Free     Rheumatoid arthritis [facility])    Sleep apnea    Vitamin D deficiency     Past Surgical History:  Procedure Laterality Date   ABDOMINAL SURGERY     laparoscopy   BIOPSY N/A 03/24/2014   Procedure: GASTRIC BIOPSIES;  Surgeon: Danie Binder, MD;  Location: [facility] ORS;  Service: Endoscopy;  Laterality: N/A;   BIOPSY  10/28/2019   Procedure: BIOPSY;  Surgeon: Danie Binder, MD;  Location: [facility];  Service: Endoscopy;;  gastric nodule   BREAST LUMPECTOMY Right    CESAREAN SECTION     X2   COLONOSCOPY  2006   internal hemorrhoids   COLONOSCOPY WITH PROPOFOL N/A  03/24/2014   Dr. [name]: 2 tubular adenomas removed, hemorrhoids   COLONOSCOPY WITH PROPOFOL N/A 10/28/2019   Fields: External and internal hemorrhoids, 8 polyps ranging from 2 to 5 mm in size removed from the colon.  Multiple tubular adenomas.  Next colonoscopy in 3 years.   CYSTOSCOPY W/ RETROGRADES  01/23/2012   Procedure: CYSTOSCOPY WITH RETROGRADE PYELOGRAM;  Surgeon: Marissa Nestle, MD;  Location: [facility];  Service: Urology;  Laterality: Left;   DILATATION & CURETTAGE/HYSTEROSCOPY WITH MYOSURE N/A 11/18/2014   Procedure: DILATATION & CURETTAGE/HYSTEROSCOPY WITH MYOSURE,  resection of polyp;  Surgeon: Cheri Fowler, MD;  Location: [facility];  Service: Gynecology;  Laterality: N/A;   DILATION AND CURETTAGE OF UTERUS     ESOPHAGOGASTRODUODENOSCOPY   08/24/2006   Dr. Veto Kemps erythema of the antrum without erosion or ulcers/Otherwise, normal esophagus without evidence of Barrett's path with chronic gastritis   ESOPHAGOGASTRODUODENOSCOPY (EGD) WITH PROPOFOL N/A 03/24/2014   Dr. [name]: gastritis   ESOPHAGOGASTRODUODENOSCOPY (EGD) WITH PROPOFOL N/A 10/28/2019   Fields: Esophagus appeared normal, empiric dilation due to history of dysphagia.  6 mm nodule seen in the gastric cardia.  Mild portal hypertensive gastropathy.  Nodule from the stomach biopsied and showed mild chronic gastritis, no H. pylori.   POLYPECTOMY N/A 03/24/2014   Procedure: POLYPECTOMY;  Surgeon: Danie Binder, MD;  Location: [facility];  Service: Endoscopy;  Laterality: N/A;   POLYPECTOMY  10/28/2019   Procedure: POLYPECTOMY;  Surgeon: Danie Binder, MD;  Location: [facility];  Service: Endoscopy;;  hepatic flexure, ascending colon,sigmoid colon, rectal   REMOVAL OF STONES  01/23/2012   Procedure: REMOVAL OF STONES;  Surgeon: Marissa Nestle, MD;  Location: [facility];  Service: Urology;  Laterality: N/A;   SAVORY DILATION N/A 10/28/2019   Procedure: SAVORY DILATION;  Surgeon: Danie Binder, MD;  Location: [facility];  Service: Endoscopy;  Laterality: N/A;   TUBAL LIGATION       Allergies  Allergen Reactions   Azithromycin Hives   Lisinopril Cough   Morphine Hives   Sulfa Antibiotics Other (See Comments)    Yeast infection   Inpatient Medications     apixaban  5 mg Oral BID   budesonide (PULMICORT) nebulizer solution  0.5 mg Nebulization BID   Chlorhexidine Gluconate Cloth  6 each Topical Daily   DULoxetine  60 mg Oral Daily   folic acid  1 mg Oral Daily   furosemide  40 mg Intravenous BID   guaiFENesin  15 mL Per Tube Q6H   insulin aspart  0-15 Units Subcutaneous Q4H   lactulose   20 g Oral BID   levothyroxine  75 mcg Oral Q0600   metoprolol tartrate  25 mg Oral BID   multivitamin with minerals  1 tablet Oral Daily   pantoprazole  40 mg Oral BID   pilocarpine  5 mg Oral BID   predniSONE  20 mg Oral Q breakfast   rifaximin  550 mg Oral BID   rosuvastatin  10 mg Oral Daily   spironolactone  50 mg Oral Daily    Family History    Family History  Problem Relation Age of Onset   Heart attack Mother        CABG   Stroke Mother    Hypertension Mother    Cancer Mother    Diabetes Mother    Heart attack Father        CABG   Hypertension Father    Mesothelioma Father  Heart attack Brother    Diabetes Brother    Depression Brother    Hyperlipidemia Brother    Diabetes Maternal Grandmother    Diabetes Maternal Grandfather    Cancer Paternal Grandmother    Diabetes Brother    Hyperlipidemia Brother    Wilson's disease Other        2 nieces, one died in her 98s   Colon cancer Neg Hx    She indicated that her mother is deceased. She indicated that her father is deceased. She indicated that both of her brothers are alive. She indicated that her maternal grandmother is deceased. She indicated that her maternal grandfather is deceased. She indicated that her paternal grandmother is deceased. She indicated that her paternal grandfather is deceased. She indicated that the status of her neg hx is unknown. She indicated that the status of her other is unknown.   Social History    Social History   Socioeconomic History   Marital status: Divorced    Spouse name: Not on file   Number of children: 2   Years of education: 14   Highest education level: Not on file  Occupational History   Occupation: Social research officer, government: [employer]  Tobacco Use   Smoking status: Former    Years: 20.00    Types: Cigarettes    Quit date: 10/24/1998    Years since quitting: 23.5   Smokeless tobacco: Never   Tobacco comments:    1 cig daily   Vaping Use   Vaping Use: Never used  Substance and Sexual Activity   Alcohol use: Not Currently    Comment: "no alcohol in years"   Drug use: No   Sexual activity: Never    Birth control/protection: Post-menopausal  Other Topics Concern   Not on file  Social History Narrative   Right handed   One story with a basement   Drinks caffeine   Social Determinants of Health   Financial Resource Strain: Not on file  Food Insecurity: No Food Insecurity (04/20/2022)   Hunger Vital Sign    Worried About Running Out of Food in the Last Year: Never true    Ran Out of Food in the Last Year: Never true  Transportation Needs: No Transportation Needs (04/20/2022)   PRAPARE - Hydrologist (Medical): No    Lack of Transportation (Non-Medical): No  Physical Activity: Not on file  Stress: Not on file  Social Connections: Not on file  Intimate Partner Violence: Not At Risk (04/20/2022)   Humiliation, Afraid, Rape, and Kick questionnaire    Fear of Current or Ex-Partner: No    Emotionally Abused: No    Physically Abused: No    Sexually Abused: No     Review of Systems    Pertinent positives and negatives noted in the HPI  Physical Exam    Blood pressure 133/65, pulse 78, temperature 97.6 F (36.4 C), temperature source Axillary, resp. rate 18, height 5' 4"  (1.626 m), weight 104.4 kg, last menstrual period 07/11/2007, SpO2 97 %.  FiO2 (%):  [32 %] 32 %  Intake/Output Summary (Last 24 hours) at 05/06/2022 0541 Last data filed at 05/06/2022 0400 Gross per 24 hour  Intake 653.77 ml  Output 1650 ml  Net -996.23 ml   Wt Readings from Last 3 Encounters:  05/06/22 104.4 kg  04/27/22 117.5 kg  03/23/22 122 kg   CONSTITUTIONAL: tired appearing and slightly diaphoretic, obese and chronically ill  appearing NECK: large neck circumference but JVP appears elevated, no masses CARDIAC: Regular rhythm (she has since converted to sinus rhythm). Normal S1/S2, no S3/S4.  No murmur. No friction rub. VASCULAR: Radial pulses intact bilaterally. No carotid bruits. PULMONARY/CHEST WALL: normal breath sounds bilaterally, normal work of breathing ABDOMINAL: soft, non-tender, non-distended EXTREMITIES: venous stasis dermatitis but no edema, warm and well-perfused   Labs    Recent Labs    05/03/22 2216 05/04/22 0027  TROPONINIHS 19* 17   Lab Results  Component Value Date   WBC 19.0 (H) 05/06/2022   HGB 12.3 05/06/2022   HCT 38.8 05/06/2022   MCV 100.3 (H) 05/06/2022   PLT 161 05/06/2022    Recent Labs  Lab 05/06/22 0357  NA 140  K 3.8  CL 93*  CO2 38*  BUN 37*  CREATININE 1.09*  CALCIUM 10.7*  PROT 8.7*  BILITOT 1.7*  ALKPHOS 53  ALT 37  AST 49*  GLUCOSE 128*   Lab Results  Component Value Date   CHOL 130 12/07/2021   HDL 29 (L) 12/07/2021   LDLCALC 92 12/07/2021   TRIG 47 12/07/2021   Lab Results  Component Value Date   DDIMER 0.93 (H) 11/08/2019   Recent Labs    04/23/22 1617 04/28/22 1429 05/02/22 0456  BNP 64.0 103.7* 168.5*   No results for input(s): "PROBNP" in the last 8760 hours.    Radiology Studies    DG Chest Port 1 View Result Date: 05/05/2022 CLINICAL DATA:  Pulmonary edema EXAM: PORTABLE CHEST 1 VIEW COMPARISON:  Radiograph 05/03/2022 FINDINGS: Unchanged cardiomegaly. Left upper extremity PICC tip overlies the mid SVC. Defibrillator pads overlie the left chest. There are diffuse interstitial and alveolar opacities. There is a small right and trace left pleural effusion. No evidence of pneumothorax. No acute osseous abnormality. Bilateral shoulder degenerative change. Thoracic spondylosis. IMPRESSION: Persistent multifocal pneumonia and/or edema with bilateral pleural effusions, right greater than left, not significant changed from prior.   CT CHEST WO CONTRAST Result Date: 04/28/2022 CLINICAL DATA:  Worsening dyspnea; recent hospital stay with multifocal pneumonia EXAM: CT CHEST WITHOUT CONTRAST TECHNIQUE:  Multidetector CT imaging of the chest was performed following the standard protocol without IV contrast. RADIATION DOSE REDUCTION: This exam was performed according to the departmental dose-optimization program which includes automated exposure control, adjustment of the mA and/or kV according to patient size and/or use of iterative reconstruction technique. COMPARISON:  Radiographs earlier today and CTA chest 04/23/2022 FINDINGS: Cardiovascular: Stable heart size. New small pericardial effusion. Coronary artery and aortic atherosclerotic calcification. Mediastinum/Nodes: Stable subcentimeter mediastinal lymph nodes. Thyroid and esophagus are unremarkable. Lungs/Pleura: Small right pleural effusion, decreased from CT 04/23/2022. Similar trace left pleural effusion. Diffuse right-greater-than-left ground-glass opacities and interstitial thickening have increased since 04/23/2022. Compressive atelectasis in the lower lobes. No pneumothorax. Upper Abdomen: Nodular liver contour compatible with cirrhosis. Question splenomegaly. Multiple borderline enlarged upper retroperitoneal nodes. No acute abnormality. Musculoskeletal: No acute abnormality. IMPRESSION: 1. Increased multiple multifocal pneumonia compared to 04/23/2022. 2. Decreased small right pleural effusion and similar left pleural effusion. 3. New small pericardial effusion. 4. Cirrhosis. 5. Upper retroperitoneal lymphadenopathy. Electronically Signed   By: Placido Sou M.D.   On: 04/28/2022 22:13    ECG & Cardiac Imaging   ECHOCARDIOGRAM COMPLETE Result Date: 04/25/2022  IMPRESSIONS  1. Left ventricular ejection fraction, by estimation, is 70 to 75%. The left ventricle has hyperdynamic function. The left ventricle has no regional wall motion abnormalities. There is mild left ventricular hypertrophy. Left ventricular diastolic  parameters are consistent with Grade I diastolic dysfunction (impaired relaxation).  2. Right ventricular systolic function is  normal. The right ventricular size is normal. Tricuspid regurgitation signal is inadequate for assessing PA pressure.  3. Left atrial size was mildly dilated.  4. The mitral valve is grossly normal. Trivial mitral valve regurgitation.  5. The aortic valve is tricuspid. There is mild calcification of the aortic valve. Aortic valve regurgitation is not visualized. Aortic valve sclerosis/calcification is present, without any evidence of aortic stenosis. Aortic valve mean gradient measures 12.0 mmHg.  6. The inferior vena cava is dilated in size with >50% respiratory variability, suggesting right atrial pressure of 8 mmHg. Comparison(s): No significant change from prior study. Prior images reviewed side by side. FINDINGS  Left Ventricle: Left ventricular ejection fraction, by estimation, is 70 to 75%. The left ventricle has hyperdynamic function. The left ventricle has no regional wall motion abnormalities. Definity contrast agent was given IV to delineate the left ventricular endocardial borders. The left ventricular internal cavity size was normal in size. There is mild left ventricular hypertrophy. Left ventricular diastolic parameters are consistent with Grade I diastolic dysfunction (impaired relaxation). Right Ventricle: The right ventricular size is normal. No increase in right ventricular wall thickness. Right ventricular systolic function is normal. Tricuspid regurgitation signal is inadequate for assessing PA pressure. Left Atrium: Left atrial size was mildly dilated. Right Atrium: Right atrial size was normal in size. Pericardium: There is no evidence of pericardial effusion. Presence of epicardial fat layer. Mitral Valve: The mitral valve is grossly normal. Mild mitral annular calcification. Trivial mitral valve regurgitation. Tricuspid Valve: The tricuspid valve is grossly normal. Tricuspid valve regurgitation is trivial. Aortic Valve: The aortic valve is tricuspid. There is mild calcification of the aortic  valve. Aortic valve regurgitation is not visualized. Aortic valve sclerosis/calcification is present, without any evidence of aortic stenosis. Aortic valve mean gradient measures 12.0 mmHg. Aortic valve peak gradient measures 19.7 mmHg. Aortic valve area, by VTI measures 2.03 cm. Pulmonic Valve: The pulmonic valve was grossly normal. Pulmonic valve regurgitation is trivial. Aorta: The aortic root is normal in size and structure. Venous: The inferior vena cava is dilated in size with greater than 50% respiratory variability, suggesting right atrial pressure of 8 mmHg. IAS/Shunts: No atrial level shunt detected by color flow Doppler.     ECGs: Last ECG showing AF was 10/17 - AF with RVR and non-specific ST-T wave abnormality- personally reviewed. Most recent ECG on 10/25 is normal.  Assessment & Plan    Paroxysmal AF with RVR: - Increase amiodarone infusion at 1 mg/min and metoprolol at current dosing - Give additional amiodarone 120m bolus over 30 minutes - If she fails to respond to above measures, we could try digoxin as she does not have enough blood pressure for further beta blocker or CCB. - She is a poor candidate for cardioversion given extreme risk for worsening respiratory failure with procedural sedation and DNR/DNI status.  - Agree with apixaban - she is a poor candidate for Watchman due to anesthesia risk and DNR/DNI status.  Acute on chronic HFpEF: - Probably still a little hypervolemic. Agree with continuing diuresis - Agree with spironolactone as well - especially given concomitant cirrhosis. - She was supposed to start on Farxiga outpatient   Signed, SMarykay Lex MD 05/06/2022, 5:41 AM  For questions or updates, please contact   Please consult [email or web address] for contact info under Cardiology/STEMI.

## 2022-05-07 DIAGNOSIS — I483 Typical atrial flutter: Secondary | ICD-10-CM | POA: Diagnosis not present

## 2022-05-07 DIAGNOSIS — I5033 Acute on chronic diastolic (congestive) heart failure: Secondary | ICD-10-CM | POA: Diagnosis not present

## 2022-05-07 DIAGNOSIS — Z515 Encounter for palliative care: Secondary | ICD-10-CM | POA: Diagnosis not present

## 2022-05-07 DIAGNOSIS — I480 Paroxysmal atrial fibrillation: Secondary | ICD-10-CM | POA: Diagnosis not present

## 2022-05-07 LAB — URINALYSIS, MICROSCOPIC (REFLEX): WBC, UA: NONE SEEN WBC/hpf (ref 0–5)

## 2022-05-07 LAB — BASIC METABOLIC PANEL
Anion gap: 11 (ref 5–15)
BUN: 31 mg/dL — ABNORMAL HIGH (ref 8–23)
CO2: 35 mmol/L — ABNORMAL HIGH (ref 22–32)
Calcium: 10.3 mg/dL (ref 8.9–10.3)
Chloride: 92 mmol/L — ABNORMAL LOW (ref 98–111)
Creatinine, Ser: 1.03 mg/dL — ABNORMAL HIGH (ref 0.44–1.00)
GFR, Estimated: 60 mL/min — ABNORMAL LOW (ref >60)
Glucose, Bld: 97 mg/dL (ref 70–99)
Potassium: 4.3 mmol/L (ref 3.5–5.1)
Sodium: 138 mmol/L (ref 135–145)

## 2022-05-07 LAB — URINALYSIS, ROUTINE W REFLEX MICROSCOPIC
Bilirubin Urine: NEGATIVE
Glucose, UA: NEGATIVE mg/dL
Ketones, ur: NEGATIVE mg/dL
Leukocytes,Ua: NEGATIVE
Nitrite: NEGATIVE
Protein, ur: NEGATIVE mg/dL
Specific Gravity, Urine: 1.015 (ref 1.005–1.030)
pH: 7.5 (ref 5.0–8.0)

## 2022-05-07 LAB — CBC
HCT: 38.5 % (ref 36.0–46.0)
Hemoglobin: 12.1 g/dL (ref 12.0–15.0)
MCH: 31.3 pg (ref 26.0–34.0)
MCHC: 31.4 g/dL (ref 30.0–36.0)
MCV: 99.7 fL (ref 80.0–100.0)
Platelets: 174 10*3/uL (ref 150–400)
RBC: 3.86 MIL/uL — ABNORMAL LOW (ref 3.87–5.11)
RDW: 16.8 % — ABNORMAL HIGH (ref 11.5–15.5)
WBC: 20.3 10*3/uL — ABNORMAL HIGH (ref 4.0–10.5)
nRBC: 0 % (ref 0.0–0.2)

## 2022-05-07 LAB — GLUCOSE, CAPILLARY
Glucose-Capillary: 98 mg/dL (ref 70–99)
Glucose-Capillary: 93 mg/dL (ref 70–99)
Glucose-Capillary: 110 mg/dL — ABNORMAL HIGH (ref 70–99)
Glucose-Capillary: 105 mg/dL — ABNORMAL HIGH (ref 70–99)
Glucose-Capillary: 113 mg/dL — ABNORMAL HIGH (ref 70–99)
Glucose-Capillary: 91 mg/dL (ref 70–99)

## 2022-05-07 MED ORDER — FUROSEMIDE 10 MG/ML IJ SOLN
40.0000 mg | Freq: Two times a day (BID) | INTRAMUSCULAR | Status: AC
  Administered 2022-05-07: 40 mg via INTRAVENOUS
  Filled 2022-05-07: qty 4

## 2022-05-07 MED ORDER — LACTULOSE 10 GM/15ML PO SOLN
10.0000 g | Freq: Every day | ORAL | Status: DC
  Administered 2022-05-09 – 2022-05-12 (×4): 10 g via ORAL
  Filled 2022-05-07 (×5): qty 15

## 2022-05-07 MED ORDER — MELATONIN 5 MG PO TABS: 10.0000 mg | ORAL_TABLET | Freq: Every evening | ORAL | Status: DC | PRN

## 2022-05-07 MED ORDER — GUAIFENESIN 100 MG/5ML PO LIQD: 5.0000 mL | ORAL | Status: DC | PRN

## 2022-05-07 MED ORDER — ACETAMINOPHEN 325 MG PO TABS
650.0000 mg | ORAL_TABLET | ORAL | Status: DC | PRN
  Administered 2022-05-09 – 2022-05-13 (×2): 650 mg via ORAL
  Filled 2022-05-07 (×3): qty 2

## 2022-05-07 MED ORDER — INSULIN ASPART 100 UNIT/ML IJ SOLN
0.0000 [IU] | Freq: Three times a day (TID) | INTRAMUSCULAR | Status: DC
  Administered 2022-05-08: 2 [IU] via SUBCUTANEOUS
  Administered 2022-05-09: 1 [IU] via SUBCUTANEOUS
  Administered 2022-05-10: 2 [IU] via SUBCUTANEOUS

## 2022-05-07 MED ORDER — INSULIN ASPART 100 UNIT/ML IJ SOLN: 0.0000 [IU] | Freq: Every day | INTRAMUSCULAR | Status: DC

## 2022-05-07 NOTE — Progress Notes (Signed)
   Palliative Medicine Inpatient Follow Up Note HPI: 65/F chronically ill with Yolanda [name] cirrhosis, diastolic CHF, obesity, PE not on AC, A-fib not on AC, chronic respiratory failure on 3L [place] O2,  OSA, hypertension, anxiety, depression, GERD, rheumatoid arthritis, hypothyroidism.  She was recently admitted at [facility] 10/11-10/19 and readmitted on 10/20 with MF PNA. Seen   Palliative care saw Yolanda [name] on 10/17 and 10/18. Given recurrent admission and intubation, we have been asked to see here for further goals of care conversations.   Today's Discussion 05/07/2022  *Please note that this is a verbal dictation therefore any spelling or grammatical errors are due to the "[place] One" system interpretation.  Chart reviewed inclusive of vital signs, progress notes, laboratory results, and diagnostic images.   I met with Yolanda [name] at bedside this morning.  She shares that she is overall feeling much improved. She remains to have intermittent nausea though feels otherwise her breathing has continued to make steady progress. The swallowing pain has lessened. Yolanda [name] shares that she got up yesterday though was incredibly weak. We discussed how hard a prolonged hospitalization can be on your body and the likely need for rehabilitation to optimize strength in the future.   Reviewed the plan for continue care. Patient remains hopeful for improvement.   Discussed plan for advance directive completion in the oncoming days.   Questions and concerns addressed/Palliative Support Provided.   Objective Assessment: Vital Signs Vitals:   05/07/22 0800 05/07/22 0826  BP: 124/61   Pulse: 64   Resp: 18   Temp:  98.1 F (36.7 C)  SpO2: 96%     Intake/Output Summary (Last 24 hours) at 05/07/2022 1026 Last data filed at 05/07/2022 0800 Gross per 24 hour  Intake 592.64 ml  Output 1821 ml  Net -1228.36 ml    Last Weight  Most recent update: 05/07/2022  5:57 AM    Weight  103.7 kg (228 lb 9.9 oz)             Gen:  Elderly caucasian F in NAD HEENT: moist mucous membranes CV: Regular rate and rhythm  PULM: On 3LPM [place] breathing is even and nonlabored ABD: soft/nontender  EXT: (+) edema  Neuro: Alert and oriented x2-3  SUMMARY OF RECOMMENDATIONS   DNAR/DNI   Chaplain support for advance directives --> should help with completion tomorrow   Goals are for improvement at this time    Pulmonary toileting Q4H   Continue Zofran PRN, added reglan Q8H PRN in addition  Appreciate speech therapy involvement for dysphagia and pain with swallow  PT/OT for generalized weakness   PMT will continue to offer incremental support  __________________________________________________________________________ [place] Team Team Cell Phone: [phone] Please utilize secure chat with additional questions, if there is no response within 30 minutes please call the above phone number  Palliative Medicine Team providers are available by phone from 7am to 7pm daily and can be reached through the team cell phone.  Should this patient require assistance outside of these hours, please call the patient's attending physician.

## 2022-05-07 NOTE — Progress Notes (Signed)
Primary Cardiologist:  Mallipeddi  Subjective:  Some nausea and poor appetite Had short period of PAF last night around 6pm   Objective:  Vitals:   05/07/22 0900 05/07/22 1000 05/07/22 1100 05/07/22 1159  BP: 121/63  102/65   Pulse: 65 68 68   Resp: 16 16 19    Temp:    97.7 F (36.5 C)  TempSrc:    Oral  SpO2: 96% 97% 99%   Weight:      Height:        Intake/Output from previous day:  Intake/Output Summary (Last 24 hours) at 05/07/2022 1325 Last data filed at 05/07/2022 1200 Gross per 24 hour  Intake 600.96 ml  Output 2001 ml  Net -1400.04 ml    Physical Exam: Chronically ill COPD with oxygen HEENT: normal Neck supple with no adenopathy JVP normal no bruits no thyromegaly Lungs clear with no wheezing and good diaphragmatic motion Heart:  S1/S2 no murmur, no rub, gallop or click PMI normal Abdomen: benighn, BS positve, no tenderness, no AAA no bruit.  No HSM or HJR Distal pulses intact with no bruits Plus one edema with stasis  Neuro non-focal Skin warm and dry No muscular weakness   Lab Results: Basic Metabolic Panel: Recent Labs    05/04/22 2221 05/05/22 0359 05/06/22 0357 05/07/22 0426  NA 141 141 140 138  K 4.9 4.4 3.8 4.3  CL 96* 99 93* 92*  CO2 34* 35* 38* 35*  GLUCOSE 145* 114* 128* 97  BUN 36* 35* 37* 31*  CREATININE 1.14* 1.04* 1.09* 1.03*  CALCIUM 10.3 10.0 10.7* 10.3  MG 2.1 2.1  --   --   PHOS  --  3.6  --   --    Liver Function Tests: Recent Labs    05/05/22 1233 05/06/22 0357  AST 49* 49*  ALT 33 37  ALKPHOS 61 53  BILITOT 1.3* 1.7*  PROT 9.5* 8.7*  ALBUMIN 2.4* 2.4*   No results for input(s): "LIPASE", "AMYLASE" in the last 72 hours. CBC: Recent Labs    05/06/22 0357 05/07/22 0426  WBC 19.0* 20.3*  HGB 12.3 12.1  HCT 38.8 38.5  MCV 100.3* 99.7  PLT 161 174     Imaging: No results found.  Cardiac Studies:  ECG: 10/25 SR rate 95 normal    Telemetry:  NSR 05/07/2022   Echo: 10/16  EF 70-75% normal RV  trivial MR AV sclerosis   Medications:    apixaban  5 mg Oral BID   budesonide (PULMICORT) nebulizer solution  0.5 mg Nebulization BID   Chlorhexidine Gluconate Cloth  6 each Topical Daily   DULoxetine  60 mg Oral Daily   folic acid  1 mg Intravenous Daily   guaiFENesin  15 mL Per Tube Q6H   insulin aspart  0-15 Units Subcutaneous Q4H   [START ON 05/08/2022] lactulose  10 g Oral Daily   levothyroxine  75 mcg Oral Q0600   metoprolol tartrate  25 mg Oral BID   pantoprazole (PROTONIX) IV  40 mg Intravenous Q12H   rifaximin  550 mg Oral BID   rosuvastatin  10 mg Oral Daily   spironolactone  50 mg Oral Daily      sodium chloride Stopped (04/30/22 0410)   amiodarone 30 mg/hr (05/07/22 1200)   piperacillin-tazobactam (ZOSYN)  IV 3.375 g (05/07/22 1231)    Assessment/Plan:   Atrial flutter:  converted with amiodarone continue iv  Continue Eliquis for anticoagulation Was going to change to PO But with  recurrence last night and nause poor PO intake continue IV RX COPD/:  chronic respiratory failure improved sats ok On Zosyn and nebs with 20 mg prednisone daily Normally on 3L's oxygen at home Thyroid:  continue synthroid replacement Follow closely on amiodarone    DNR/DNI  Jenkins Rouge 05/07/2022, 1:25 PM

## 2022-05-07 NOTE — Progress Notes (Signed)
PROGRESS NOTE    Yolanda [name]  NOM:[id number] DOB: 1955/08/14 DOA: 04/28/2022 PCP: Celene Squibb, MD  65/F chronically ill with [name] cirrhosis, diastolic CHF, obesity, PE not on AC, A-fib not on AC, chronic respiratory failure on 3L [place] O2,  OSA, hypertension, anxiety, depression, GERD, rheumatoid arthritis, hypothyroidism.  She was recently admitted at [facility] 10/11-10/19 with AMS and acute cystitis.   Work-up was negative for PE, found to have new onset Afib, multi-focal PNA and effusion and was diuresed and discharged home on 10/19 on antibiotics. -Return to the ED 10/20 with worsening shortness of breath and hypoxia, placed on BiPAP, CXR again showed multi-focal bilateral infiltrates, restarted antibiotics and diuretics on 10/22 her respiratory status began to decline despite bipap and PCCM was consulted for transfer to the ICU 10/22 intubated 10/25: Extubated, remained encephalopathic 10/26: started on amio gtt for Afib/RVR Transferred to [facility] service 10/27   Subjective: -No events overnight, did not require bolus of amiodarone last night  Assessment and Plan:  Acute on Chronic Hypoxic Respiratory Failure secondary to Multifocal PNA: Aspiration Acute on chronic HFpEF with pulmonary edema -Extubated 10/25 -Concern for recurrent aspiration, possibly esophageal, SLP eval noted -Completed 7 days of IV Zosyn today, leukocytosis worsening, no new resp symptoms, Foley catheter was removed yesterday, will check urinalysis, and has a left subclavian central line, attempt to remove this today, check blood cultures and place peripherals -Pulmonary toilet, aspiration precautions, wean O2 as tolerated  Dysphagia -Barium esophagram attempted on Friday, could not be completed-patient was vomiting could not tolerate study, reattempt on Monday -Aspiration precautions -Tolerating current diet at this time  Acute on chronic diastolic CHF -Echo 96/28 with EF of 36-62%, grade 1 diastolic dysfunction,  preserved RV function -Diuresed with IV Lasix, continue Aldactone, appears euvolemic today, hold further IV Lasix -She is 14.1 L negative -Albumin is low at baseline with liver disease, transition to torsemide tomorrow   Atrial Fibrillation with RVR  -Required IV amiodarone bolus last night and few nights ago -Continue Lopressor and Eliquis -Now on Amio gtt., cardiology consulting, repleted potassium, check mag   hypokalemia -Replaced   Acute metabolic encephalopathy.   -Suspect mixed picture of post infection, also hepatic encephalopathic component -Improving, completing antibiotic course, repeat ammonia level was normal yesterday  -Check ABG tomorrow a.m.   NASH liver cirrhosis  -Continue rifaximin, lactulose Continue spironolactone   Type 2 DM -Holding metformin continue sliding scale insulin   Hypothyroidism -Continue levothyroxine   GERD -Esophagram PPI and Carafate  Class 3 obesity (HCC) Calculated BMI is 44.3 Patient with very poor physical functional status, uses a walker for ambulation.   Depression and anxiety,  Continue with daily duloxetine.  Discontinued alprazolam.   DVT prophylaxis: Eliquis Code Status: DNR Family Communication: no Family at bedside, updated daughter [name] yesterday Disposition Plan: Transfer to progressive  Consultants: PCCM   Procedures:   Antimicrobials:    Objective: Vitals:   05/07/22 0600 05/07/22 0700 05/07/22 0800 05/07/22 0826  BP: 132/61 116/73 124/61   Pulse: 60 65 64   Resp: _0 Temp:    98.1 F (36.7 C)  TempSrc:    Oral  SpO2: 100% 91% 96%   Weight:      Height:        Intake/Output Summary (Last 24 hours) at 05/07/2022 0928 Last data filed at 05/07/2022 0800 Gross per 24 hour  Intake 664.55 ml  Output 1821 ml  Net -1156.45 ml   [employer]  05/05/22 0403 05/06/22 0500 05/07/22 0500  Weight: 104.1 kg 104.4 kg 103.7 kg    Examination:  General exam: Obese chronically ill female  appears older than stated age, awake alert oriented x3 HEENT: Neck obese unable to assess JVD, left IJ subclavian CVS: S1-S2, regular rhythm Lungs: Few basilar rales, improving air movement Abdomen: Soft, obese, nontender, do not appreciate fluid thrill, bowel sounds present Extremities: No edema Neuro: Moves all extremities, no localizing signs Skin: No rashes Psychiatry:  Mood & affect appropriate.     Data Reviewed:   CBC: Recent Labs  Lab 05/03/22 0249 05/04/22 0425 05/05/22 0359 05/06/22 0357 05/07/22 0426  WBC 7.4 16.3* 19.0* 19.0* 20.3*  HGB 10.3* 12.5 12.8 12.3 12.1  HCT 32.8* 39.5 39.6 38.8 38.5  MCV 101.5* 101.5* 99.5 100.3* 99.7  PLT 132* 206 203 161 017   Basic Metabolic Panel: Recent Labs  Lab 05/02/22 0456 05/02/22 0614 05/02/22 1704 05/03/22 0249 05/03/22 1615 05/03/22 2216 05/04/22 0425 05/04/22 2221 05/05/22 0359 05/06/22 0357 05/07/22 0426  NA 145   < >  --  149*   < > 143 141 141 141 140 138  K 3.2*   < >  --  3.4*   < > 3.6 3.1* 4.9 4.4 3.8 4.3  CL 106  --   --  108   < > 98 97* 96* 99 93* 92*  CO2 33*  --   --  35*   < > 33* 35* 34* 35* 38* 35*  GLUCOSE 149*  --   --  142*   < > 118* 103* 145* 114* 128* 97  BUN 40*  --   --  41*   < > 33* 32* 36* 35* 37* 31*  CREATININE 0.90  --   --  0.86   < > 1.12* 0.89 1.14* 1.04* 1.09* 1.03*  CALCIUM 9.0  --   --  9.7   < > 10.5* 10.4* 10.3 10.0 10.7* 10.3  MG 1.6*  --   --  2.4  --  1.9 1.8 2.1 2.1  --   --   PHOS 2.2*  --  4.9* 2.4*  --   --   --   --  3.6  --   --    < > = values in this interval not displayed.   GFR: Estimated Creatinine Clearance: 62.2 mL/min (A) (by C-G formula based on SCr of 1.03 mg/dL (H)). Liver Function Tests: Recent Labs  Lab 05/02/22 0456 05/03/22 1615 05/05/22 1233 05/06/22 0357  AST 34 38 49* 49*  ALT 19 23 33 37  ALKPHOS 50 54 61 53  BILITOT 1.1 1.3* 1.3* 1.7*  PROT 9.6* 9.8* 9.5* 8.7*  ALBUMIN 2.7* 2.7* 2.4* 2.4*   No results for input(s): "LIPASE",  "AMYLASE" in the last 168 hours. Recent Labs  Lab 05/02/22 0456 05/03/22 0249 05/05/22 0815  AMMONIA 64* 23 28   Coagulation Profile: Recent Labs  Lab 05/02/22 0456 05/04/22 0425  INR 1.7* 1.8*   Cardiac Enzymes: No results for input(s): "CKTOTAL", "CKMB", "CKMBINDEX", "TROPONINI" in the last 168 hours. BNP (last 3 results) No results for input(s): "PROBNP" in the last 8760 hours. HbA1C: No results for input(s): "HGBA1C" in the last 72 hours. CBG: Recent Labs  Lab 05/06/22 1606 05/06/22 1956 05/06/22 2338 05/07/22 0323 05/07/22 0820  GLUCAP 105* 129* 119* 98 93   Lipid Profile: No results for input(s): "CHOL", "HDL", "LDLCALC", "TRIG", "CHOLHDL", "LDLDIRECT" in the last 72 hours. Thyroid Function Tests:  No results for input(s): "TSH", "T4TOTAL", "FREET4", "T3FREE", "THYROIDAB" in the last 72 hours. Anemia Panel: No results for input(s): "VITAMINB12", "FOLATE", "FERRITIN", "TIBC", "IRON", "RETICCTPCT" in the last 72 hours. Urine analysis:    Component Value Date/Time   COLORURINE YELLOW 04/22/2022 1430   APPEARANCEUR CLEAR 04/22/2022 1430   APPEARANCEUR Clear 03/08/2022 1549   LABSPEC 1.016 04/22/2022 1430   PHURINE 5.0 04/22/2022 1430   GLUCOSEU NEGATIVE 04/22/2022 1430   HGBUR SMALL (A) 04/22/2022 1430   BILIRUBINUR NEGATIVE 04/22/2022 1430   BILIRUBINUR Negative 03/08/2022 1549   KETONESUR NEGATIVE 04/22/2022 1430   PROTEINUR NEGATIVE 04/22/2022 1430   UROBILINOGEN negative (A) 07/02/2019 0932   UROBILINOGEN 0.2 04/15/2015 0028   NITRITE NEGATIVE 04/22/2022 1430   LEUKOCYTESUR SMALL (A) 04/22/2022 1430   Sepsis Labs: _0 (procalcitonin:4,lacticidven:4)  ) Recent Results (from the past 240 hour(s))  Culture, BAL-quantitative w Gram Stain     Status: None   Collection Time: 04/30/22  3:36 PM   Specimen: Bronchoalveolar Lavage; Respiratory  Result Value Ref Range Status   Specimen Description BRONCHIAL ALVEOLAR LAVAGE  Final   Special Requests  Normal  Final   Gram Stain   Final    RARE WBC PRESENT, PREDOMINANTLY PMN NO ORGANISMS SEEN    Culture   Final    NO GROWTH 2 DAYS Performed at [facility], [place] [address] [place], [place] [address]    Report Status 05/02/2022 FINAL  Final  MRSA Next Gen by PCR, Nasal     Status: None   Collection Time: 05/01/22  4:11 AM   Specimen: Nasal Mucosa; Nasal Swab  Result Value Ref Range Status   MRSA by PCR Next Gen NOT DETECTED NOT DETECTED Final    Comment: (NOTE) The GeneXpert MRSA Assay (FDA approved for NASAL specimens only), is one component of a comprehensive MRSA colonization surveillance program. It is not intended to diagnose MRSA infection nor to guide or monitor treatment for MRSA infections. Test performance is not FDA approved in patients less than 31 years old. Performed at [facility], [place] [address] [place], [place] [address]      Radiology Studies: DG ESOPHAGUS W SINGLE CM (SOL OR THIN BA)  Result Date: 05/05/2022 CLINICAL DATA:  Provided history: Dysphagia. Additional history provided: History of NASH cirrhosis, chronic respiratory failure, aspiration pneumonia. EXAM: ESOPHAGUS/BARIUM SWALLOW/TABLET STUDY TECHNIQUE: A single contrast examination was performed using thin liquid barium. The exam was performed by Rushie Nyhan NP, and was supervised and interpreted by Dr. Kellie Simmering. FLUOROSCOPY: Fluoroscopy time: 2 minutes, 54 seconds (46.2 mGy). COMPARISON:  Modified barium swallow 05/04/2022. Esophagram 04/13/2017. FINDINGS: Limited examination due to the patient's inability to stand and limited ability to reposition on the fluoroscopy table. Additionally, the patient had difficulty drinking contrast and vomited during the examination. A barium tablet could not be administered. The very distal esophagus could not be fully distended. Elsewhere, the esophagus appears normal in caliber and smooth in contour. Moderate intermittent esophageal dysmotility  with tertiary contractions. No appreciable hiatal hernia. No gastroesophageal reflux was observed. IMPRESSION: Limited examination due to the patient's inability to stand and limited ability to reposition on the fluoroscopy table. Additionally, the patient had difficulty drinking contrast and vomited during the examination. A barium tablet could not be administered. The very distal esophagus could not be fully distended, and a mild smooth distal esophageal stricture cannot be excluded. Consider a repeat examination when the patient is better able to tolerate the study. Alternatively, consider endoscopy for  further evaluation. Moderate esophageal dysmotility with tertiary contractions. Electronically Signed   By: Kellie Simmering D.O.   On: 05/05/2022 10:53     Scheduled Meds:  apixaban  5 mg Oral BID   budesonide (PULMICORT) nebulizer solution  0.5 mg Nebulization BID   Chlorhexidine Gluconate Cloth  6 each Topical Daily   DULoxetine  60 mg Oral Daily   folic acid  1 mg Intravenous Daily   furosemide  40 mg Intravenous BID   guaiFENesin  15 mL Per Tube Q6H   insulin aspart  0-15 Units Subcutaneous Q4H   lactulose  20 g Oral BID   levothyroxine  75 mcg Oral Q0600   metoprolol tartrate  25 mg Oral BID   pantoprazole (PROTONIX) IV  40 mg Intravenous Q12H   rifaximin  550 mg Oral BID   rosuvastatin  10 mg Oral Daily   spironolactone  50 mg Oral Daily   Continuous Infusions:  sodium chloride Stopped (04/30/22 0410)   amiodarone 30 mg/hr (05/07/22 0800)   piperacillin-tazobactam (ZOSYN)  IV Stopped (05/07/22 0554)     LOS: 9 days    Time spent: 71mn    PDomenic Polite MD [facility]   05/07/2022, 9:28 AM

## 2022-05-07 NOTE — Progress Notes (Signed)
RT note: ABG attempted x 2 unable to collect sample at this time

## 2022-05-07 NOTE — Progress Notes (Signed)
Attempted PIV x 2 VAS T RN's.  Veins fragile, blow easily.  Amiodarone IV ordered.  Spoke with pt, dtr and Rhoderick Moody re PICC placement.  Pt and dtr agreeable.  Jeneen Rinks RN to notify MD or recommendation for PICC placement

## 2022-05-08 DIAGNOSIS — I5033 Acute on chronic diastolic (congestive) heart failure: Secondary | ICD-10-CM | POA: Diagnosis not present

## 2022-05-08 DIAGNOSIS — I480 Paroxysmal atrial fibrillation: Secondary | ICD-10-CM | POA: Diagnosis not present

## 2022-05-08 LAB — COMPREHENSIVE METABOLIC PANEL
ALT: 33 U/L (ref 0–44)
AST: 46 U/L — ABNORMAL HIGH (ref 15–41)
Albumin: 2.1 g/dL — ABNORMAL LOW (ref 3.5–5.0)
Alkaline Phosphatase: 63 U/L (ref 38–126)
Anion gap: 7 (ref 5–15)
BUN: 27 mg/dL — ABNORMAL HIGH (ref 8–23)
CO2: 38 mmol/L — ABNORMAL HIGH (ref 22–32)
Calcium: 10.2 mg/dL (ref 8.9–10.3)
Chloride: 94 mmol/L — ABNORMAL LOW (ref 98–111)
Creatinine, Ser: 1.05 mg/dL — ABNORMAL HIGH (ref 0.44–1.00)
GFR, Estimated: 58 mL/min — ABNORMAL LOW (ref >60)
Glucose, Bld: 100 mg/dL — ABNORMAL HIGH (ref 70–99)
Potassium: 3.1 mmol/L — ABNORMAL LOW (ref 3.5–5.1)
Sodium: 139 mmol/L (ref 135–145)
Total Bilirubin: 1.3 mg/dL — ABNORMAL HIGH (ref 0.3–1.2)
Total Protein: 8.5 g/dL — ABNORMAL HIGH (ref 6.5–8.1)

## 2022-05-08 LAB — URINE CULTURE: Culture: <10000 — AB

## 2022-05-08 LAB — GLUCOSE, CAPILLARY
Glucose-Capillary: 160 mg/dL — ABNORMAL HIGH (ref 70–99)
Glucose-Capillary: 91 mg/dL (ref 70–99)
Glucose-Capillary: 108 mg/dL — ABNORMAL HIGH (ref 70–99)
Glucose-Capillary: 105 mg/dL — ABNORMAL HIGH (ref 70–99)

## 2022-05-08 LAB — CBC
HCT: 36.8 % (ref 36.0–46.0)
Hemoglobin: 12.2 g/dL (ref 12.0–15.0)
MCH: 32.4 pg (ref 26.0–34.0)
MCHC: 33.2 g/dL (ref 30.0–36.0)
MCV: 97.6 fL (ref 80.0–100.0)
Platelets: 154 10*3/uL (ref 150–400)
RBC: 3.77 MIL/uL — ABNORMAL LOW (ref 3.87–5.11)
RDW: 16.6 % — ABNORMAL HIGH (ref 11.5–15.5)
WBC: 11.8 10*3/uL — ABNORMAL HIGH (ref 4.0–10.5)
nRBC: 0 % (ref 0.0–0.2)

## 2022-05-08 LAB — MAGNESIUM: Magnesium: 1.6 mg/dL — ABNORMAL LOW (ref 1.7–2.4)

## 2022-05-08 MED ORDER — POTASSIUM CHLORIDE CRYS ER 10 MEQ PO TBCR
40.0000 meq | EXTENDED_RELEASE_TABLET | Freq: Once | ORAL | Status: AC
  Administered 2022-05-08: 40 meq via ORAL
  Filled 2022-05-08: qty 4

## 2022-05-08 MED ORDER — POTASSIUM CHLORIDE 20 MEQ PO PACK
40.0000 meq | PACK | Freq: Once | ORAL | Status: DC
  Filled 2022-05-08: qty 2

## 2022-05-08 MED ORDER — POTASSIUM CHLORIDE CRYS ER 20 MEQ PO TBCR
40.0000 meq | EXTENDED_RELEASE_TABLET | Freq: Every day | ORAL | Status: DC
  Administered 2022-05-08 – 2022-05-11 (×4): 40 meq via ORAL
  Filled 2022-05-08 (×4): qty 2

## 2022-05-08 MED ORDER — MAGNESIUM SULFATE 4 GM/100ML IV SOLN
4.0000 g | Freq: Once | INTRAVENOUS | Status: AC
  Administered 2022-05-08: 4 g via INTRAVENOUS
  Filled 2022-05-08: qty 100

## 2022-05-08 MED ORDER — ENSURE ENLIVE PO LIQD
237.0000 mL | Freq: Three times a day (TID) | ORAL | Status: DC
  Administered 2022-05-09: 237 mL via ORAL

## 2022-05-08 MED ORDER — TORSEMIDE 20 MG PO TABS
20.0000 mg | ORAL_TABLET | Freq: Every day | ORAL | Status: DC
  Administered 2022-05-08 – 2022-05-11 (×4): 20 mg via ORAL
  Filled 2022-05-08 (×4): qty 1

## 2022-05-08 NOTE — Progress Notes (Signed)
This chaplain responded to PMT NP-MYF consult for creating/updating the Pt. Advance Directive.  The Pt. is sitting in the bedside recliner. The Pt. is alert and willing to participate in AD education, HCPOA and Living Will. The Pt. daughter-Amanda is visiting at the bedside.  The chaplain provided AD education with opportunities for the Pt. to asked clarifying questions. The chaplain understands the Pt. desires a pause to think about her choices. The chaplain understands the Pt. goal is to have her medical decisions documented while she has the ability to make choices for herself. The chaplain will revisit on Tuesday.  An incomplete AD document with education was left in the Pt. room.  Chaplain Sallyanne Kuster [phone]

## 2022-05-08 NOTE — TOC Progression Note (Signed)
[facility]) - Progression Note    Patient Details  Name: Yolanda [name] MRN: [id number] Date of Birth: 10/10/1955  Transition of [facility]) CM/SW Contact  [name], Ocie Cornfield, RN Phone Number: 05/08/2022, 12:49 PM  Clinical Narrative: Case Manager spoke with daughter [name] this morning and she states she was able to get her mothers [facility]; however, it will not ne reinstated until December 1st, 2023. If patient has expensive meds; patient will need patient assistance applications filled out for medication assistance. Patient is aware of Good Rx as well. Unit Case Manager will continue to follow.   Expected Discharge Plan: [place] Barriers to Discharge: Continued Medical Work up  Expected Discharge Plan and Services Expected Discharge Plan: [place] In-house Referral: Clinical Social Work Discharge Planning Services: CM Consult Post Acute Care Choice: Home Health, Resumption of Svcs/PTA Provider Living arrangements for the past 2 months: Single Family Home                   DME Agency: NA       HH Arranged: RN, Disease Management, Nurse's Aide, Social Work [employer] Agency: Microbiologist ([place]) Date [place]: 05/04/22 Time [place]: 1626 Representative spoke with at [place]: [place]  Readmission Risk Interventions    05/04/2022    4:24 PM 04/25/2022    1:27 PM 09/22/2021   11:13 AM  Readmission Risk Prevention Plan  Post Dischage Appt   Complete  Medication Screening   Complete  Transportation Screening Complete Complete Complete  HRI or [place]  Complete   Social Work Consult for [place] Planning/Counseling  Complete   Palliative Care Screening  Not Applicable   Medication Review Press photographer) Complete Complete   HRI or [place] Complete    SW Recovery Care/Counseling Consult Complete    [place] Not Applicable

## 2022-05-08 NOTE — Progress Notes (Signed)
PROGRESS NOTE    CLAUDE SWENDSEN  JQZ:[id number] DOB: 11-12-1955 DOA: 04/28/2022 PCP: Celene Squibb, MD  66/F chronically ill with [name] cirrhosis, diastolic CHF, obesity, PE not on AC, A-fib not on AC, chronic respiratory failure on 3L [place] O2,  OSA, hypertension, anxiety, depression, GERD, rheumatoid arthritis, hypothyroidism.  She was recently admitted at [facility] 10/11-10/19 with AMS and acute cystitis.   Work-up was negative for PE, found to have new onset Afib, multi-focal PNA and effusion and was diuresed and discharged home on 10/19 on antibiotics. -Return to the ED 10/20 with worsening shortness of breath and hypoxia, placed on BiPAP, CXR again showed multi-focal bilateral infiltrates, restarted antibiotics and diuretics on 10/22 her respiratory status began to decline despite bipap and PCCM was consulted for transfer to the ICU 10/22 intubated 10/25: Extubated, remained encephalopathic 10/26: started on amio gtt for Afib/RVR Transferred to [facility] service 10/27 10/27-10/29 with intermittent bursts of rapid A-fib-cards consulting   Subjective: -Continues to have nausea-possibly from amiodarone, oral intake is poor, breathing improving  Assessment and Plan:  Acute on Chronic Hypoxic Respiratory Failure secondary to Multifocal PNA: Aspiration Acute on chronic HFpEF with pulmonary edema -Extubated 10/25 -Concern for recurrent aspiration, possibly esophageal, SLP eval noted -Completed 7 days of IV Zosyn on 10/29, leukocytosis worsened, no new resp symptoms, Foley catheter was removed, UA unremarkable, left subclavian central line removed yesterday, blood cultures negative so far, WBC down to 11.8, monitor clinically -Pulmonary toilet, aspiration precautions, wean O2 as tolerated -Transfer out of ICU today  Dysphagia -Barium esophagram attempted on Friday, could not be completed-patient was vomiting could not tolerate study, will reorder -Aspiration precautions -Tolerating current diet at  this time  Acute on chronic diastolic CHF Decompensated liver cirrhosis with fluid overload -Echo 10/23 with EF of 62-26%, grade 1 diastolic dysfunction, preserved RV function -Diuresed with IV Lasix, appears euvolemic now -She is 66. 2 L negative -Albumin is low at baseline with liver disease, transition to torsemide today, continue Aldactone   Atrial Fibrillation with RVR  -Required IV amiodarone bolus last night and few nights ago -Continue Lopressor and Eliquis -Now on Amio gtt., cardiology consulting, repleted potassium, check mag   hypokalemia -Replaced   Acute metabolic encephalopathy.   -Suspect mixed picture of post infection, also hepatic encephalopathic component -Improving, completing antibiotic course, repeat ammonia level was normal   NASH liver cirrhosis  -Continue rifaximin, lactulose -Diuretics, now on torsemide and Aldactone   Type 2 DM -Holding metformin continue sliding scale insulin   Hypothyroidism -Continue levothyroxine   GERD -Esophagram PPI and Carafate  Class 3 obesity (HCC) Calculated BMI is 44.3 Patient with very poor physical functional status, uses a walker for ambulation.   Depression and anxiety,  Continue with daily duloxetine.  Discontinued alprazolam.   DVT prophylaxis: Eliquis Code Status: DNR Family Communication: no Family at bedside, updated daughter [name] 10/28 Disposition Plan: Transfer to progressive  Consultants: PCCM   Procedures:   Antimicrobials:    Objective: Vitals:   05/08/22 0600 05/08/22 0700 05/08/22 0703 05/08/22 0800  BP:   123/83   Pulse: 64 63 65   Resp: 18 (!) 24 18   Temp:  97.9 F (36.6 C)  97.9 F (36.6 C)  TempSrc:  Oral  Oral  SpO2: 99% 98% 98%   Weight:      Height:        Intake/Output Summary (Last 24 hours) at 05/08/2022 3335 Last data filed at 05/08/2022 0700 Gross per 24 hour  Intake  792.75 ml  Output 750 ml  Net 42.75 ml   Filed Weights   05/06/22 0500 05/07/22 0500  05/08/22 0500  Weight: 104.4 kg 103.7 kg 107.4 kg    Examination:  General exam: Obese chronically ill female appears older than stated age, awake alert oriented to self and place, partly to time HEENT: Neck obese, no JVD, left central line out CVS: S1-S2, regular rhythm Lungs: Decreased breath sounds to bases otherwise clear Abdomen: Soft, obese, nontender, bowel sounds present Extremities: No edema  Neuro: Moves all extremities, no localizing signs Skin: No rashes Psychiatry:  Mood & affect appropriate.     Data Reviewed:   CBC: Recent Labs  Lab 05/04/22 0425 05/05/22 0359 05/06/22 0357 05/07/22 0426 05/08/22 0441  WBC 16.3* 19.0* 19.0* 20.3* 11.8*  HGB 12.5 12.8 12.3 12.1 12.2  HCT 39.5 39.6 38.8 38.5 36.8  MCV 101.5* 99.5 100.3* 99.7 97.6  PLT 206 203 161 174 290   Basic Metabolic Panel: Recent Labs  Lab 05/02/22 0456 05/02/22 0614 05/02/22 1704 05/03/22 0249 05/03/22 1615 05/03/22 2216 05/04/22 0425 05/04/22 2221 05/05/22 0359 05/06/22 0357 05/07/22 0426 05/08/22 0441  NA 145   < >  --  149*   < > 143 141 141 141 140 138 139  K 3.2*   < >  --  3.4*   < > 3.6 3.1* 4.9 4.4 3.8 4.3 3.1*  CL 106  --   --  108   < > 98 97* 96* 99 93* 92* 94*  CO2 33*  --   --  35*   < > 33* 35* 34* 35* 38* 35* 38*  GLUCOSE 149*  --   --  142*   < > 118* 103* 145* 114* 128* 97 100*  BUN 40*  --   --  41*   < > 33* 32* 36* 35* 37* 31* 27*  CREATININE 0.90  --   --  0.86   < > 1.12* 0.89 1.14* 1.04* 1.09* 1.03* 1.05*  CALCIUM 9.0  --   --  9.7   < > 10.5* 10.4* 10.3 10.0 10.7* 10.3 10.2  MG 1.6*  --   --  2.4  --  1.9 1.8 2.1 2.1  --   --  1.6*  PHOS 2.2*  --  4.9* 2.4*  --   --   --   --  3.6  --   --   --    < > = values in this interval not displayed.   GFR: Estimated Creatinine Clearance: 62.2 mL/min (A) (by C-G formula based on SCr of 1.05 mg/dL (H)). Liver Function Tests: Recent Labs  Lab 05/02/22 0456 05/03/22 1615 05/05/22 1233 05/06/22 0357 05/08/22 0441   AST 34 38 49* 49* 46*  ALT 19 23 33 37 33  ALKPHOS 50 54 61 53 63  BILITOT 1.1 1.3* 1.3* 1.7* 1.3*  PROT 9.6* 9.8* 9.5* 8.7* 8.5*  ALBUMIN 2.7* 2.7* 2.4* 2.4* 2.1*   No results for input(s): "LIPASE", "AMYLASE" in the last 168 hours. Recent Labs  Lab 05/02/22 0456 05/03/22 0249 05/05/22 0815  AMMONIA 64* 23 28   Coagulation Profile: Recent Labs  Lab 05/02/22 0456 05/04/22 0425  INR 1.7* 1.8*   Cardiac Enzymes: No results for input(s): "CKTOTAL", "CKMB", "CKMBINDEX", "TROPONINI" in the last 168 hours. BNP (last 3 results) No results for input(s): "PROBNP" in the last 8760 hours. HbA1C: No results for input(s): "HGBA1C" in the last 72 hours. CBG: Recent Labs  Lab 05/07/22 1154 05/07/22 1601 05/07/22 2004 05/07/22 2200 05/08/22 0653  GLUCAP 110* 105* 113* 91 160*   Lipid Profile: No results for input(s): "CHOL", "HDL", "LDLCALC", "TRIG", "CHOLHDL", "LDLDIRECT" in the last 72 hours. Thyroid Function Tests: No results for input(s): "TSH", "T4TOTAL", "FREET4", "T3FREE", "THYROIDAB" in the last 72 hours. Anemia Panel: No results for input(s): "VITAMINB12", "FOLATE", "FERRITIN", "TIBC", "IRON", "RETICCTPCT" in the last 72 hours. Urine analysis:    Component Value Date/Time   COLORURINE YELLOW 05/07/2022 1236   APPEARANCEUR CLEAR 05/07/2022 1236   APPEARANCEUR Clear 03/08/2022 1549   LABSPEC 1.015 05/07/2022 1236   PHURINE 7.5 05/07/2022 1236   GLUCOSEU NEGATIVE 05/07/2022 1236   HGBUR TRACE (A) 05/07/2022 1236   BILIRUBINUR NEGATIVE 05/07/2022 1236   BILIRUBINUR Negative 03/08/2022 [employer] 05/07/2022 1236   PROTEINUR NEGATIVE 05/07/2022 1236   UROBILINOGEN negative (A) 07/02/2019 0932   UROBILINOGEN 0.2 04/15/2015 0028   NITRITE NEGATIVE 05/07/2022 1236   LEUKOCYTESUR NEGATIVE 05/07/2022 1236   Sepsis Labs: _0 (procalcitonin:4,lacticidven:4)  ) Recent Results (from the past 240 hour(s))  Culture, BAL-quantitative w Gram Stain      Status: None   Collection Time: 04/30/22  3:36 PM   Specimen: Bronchoalveolar Lavage; Respiratory  Result Value Ref Range Status   Specimen Description BRONCHIAL ALVEOLAR LAVAGE  Final   Special Requests Normal  Final   Gram Stain   Final    RARE WBC PRESENT, PREDOMINANTLY PMN NO ORGANISMS SEEN    Culture   Final    NO GROWTH 2 DAYS Performed at [facility], [place] [address], [place], [place] [address]    Report Status 05/02/2022 FINAL  Final  MRSA Next Gen by PCR, Nasal     Status: None   Collection Time: 05/01/22  4:11 AM   Specimen: Nasal Mucosa; Nasal Swab  Result Value Ref Range Status   MRSA by PCR Next Gen NOT DETECTED NOT DETECTED Final    Comment: (NOTE) The GeneXpert MRSA Assay (FDA approved for NASAL specimens only), is one component of a comprehensive MRSA colonization surveillance program. It is not intended to diagnose MRSA infection nor to guide or monitor treatment for MRSA infections. Test performance is not FDA approved in patients less than 39 years old. Performed at [facility], [place] [address], [place], [place] [address]   Culture, blood (Routine X 2) w Reflex to ID Panel     Status: None (Preliminary result)   Collection Time: 05/07/22  8:57 AM   Specimen: BLOOD  Result Value Ref Range Status   Specimen Description BLOOD RIGHT ANTECUBITAL  Final   Special Requests IN PEDIATRIC BOTTLE Blood Culture adequate volume  Final   Culture   Final    NO GROWTH < 24 HOURS Performed at [facility], [place] [address] [place], [place] [address]    Report Status PENDING  Incomplete  Culture, blood (Routine X 2) w Reflex to ID Panel     Status: None (Preliminary result)   Collection Time: 05/07/22  9:15 AM   Specimen: BLOOD LEFT ARM  Result Value Ref Range Status   Specimen Description BLOOD LEFT ARM  Final   Special Requests   Final    BOTTLES DRAWN AEROBIC ONLY Blood Culture results may not be optimal due to an inadequate volume of blood received  in culture bottles   Culture   Final    NO GROWTH < 24 HOURS Performed at [facility], [place]  [address], [place], [place] [address]    Report Status PENDING  Incomplete  Urine Culture     Status: Abnormal   Collection Time: 05/07/22 12:36 PM   Specimen: Urine, Clean Catch  Result Value Ref Range Status   Specimen Description URINE, CLEAN CATCH  Final   Special Requests NONE  Final   Culture (A)  Final    <10,000 COLONIES/mL INSIGNIFICANT GROWTH Performed at [facility], [place] [address], [place], [place] [address]    Report Status 05/08/2022 FINAL  Final     Radiology Studies: No results found.   Scheduled Meds:  apixaban  5 mg Oral BID   budesonide (PULMICORT) nebulizer solution  0.5 mg Nebulization BID   Chlorhexidine Gluconate Cloth  6 each Topical Daily   DULoxetine  60 mg Oral Daily   folic acid  1 mg Intravenous Daily   insulin aspart  0-5 Units Subcutaneous QHS   insulin aspart  0-9 Units Subcutaneous TID WC   lactulose  10 g Oral Daily   levothyroxine  75 mcg Oral Q0600   metoprolol tartrate  25 mg Oral BID   pantoprazole (PROTONIX) IV  40 mg Intravenous Q12H   potassium chloride  40 mEq Oral Daily   rifaximin  550 mg Oral BID   rosuvastatin  10 mg Oral Daily   spironolactone  50 mg Oral Daily   torsemide  20 mg Oral Daily   Continuous Infusions:  sodium chloride Stopped (04/30/22 0410)   amiodarone 30 mg/hr (05/08/22 0700)     LOS: 10 days    Time spent: 41mn    PDomenic Polite MD [facility]   05/08/2022, 9:38 AM

## 2022-05-08 NOTE — Progress Notes (Signed)
Physical Therapy Treatment Patient Details Name: Yolanda [name] MRN: [id number] DOB: 07/14/1955 Today's Date: 05/08/2022   History of Present Illness Pt is 66 y/o F admitted to [facility] on 04/28/22 wih SOB, hypoxia, acute on chronic heart failure, Afib with RVR. Worsening hypoxic respiratory failure 10/22 with transfer to ICU; ETT 10/22-10/25. Of note, recent hosptial admission 10/11-10/19 with acute encephalopathy. PMH of anxiety, depression, fibroylagia, GERD, HTN, hypothyroidism, obesity, RA, PE, liver cirrhosis.    PT Comments    Pt progressing with mobility. Second session today with PT as session was cut short earlier due to extended time needed to void on BSC. Pt ambulated minA RW 22 ft to bathroom. Became significantly fatigued after using commode. Was unable to ambulate back to recliner, requiring minA+2 to recliner at bathroom entrance due to decreased engagement from pt as fatigue increased. BP after mobility 124/60s. Pt remains limited by generalized weakness, decreased activity tolerance, poor insight of deficits impacting safety, and impaired balance strategies/postural reactions. Continue to recommend acute PT services to maximize functional mobility and independence prior to d/c to AIR level therapies.    Recommendations for follow up therapy are one component of a multi-disciplinary discharge planning process, led by the attending physician.  Recommendations may be updated based on patient status, additional functional criteria and insurance authorization.  Follow Up Recommendations  Acute inpatient rehab (3hours/day) Can patient physically be transported by private vehicle: Yes   Assistance Recommended at Discharge Frequent or constant Supervision/Assistance  Patient can return home with the following Help with stairs or ramp for entrance;Assistance with cooking/housework;A lot of help with bathing/dressing/bathroom;Assist for transportation;A little help with walking and/or  transfers   Equipment Recommendations  Other (comment) (TBD)    Recommendations for Other Services       Precautions / Restrictions Precautions Precautions: Fall Restrictions Weight Bearing Restrictions: No     Mobility  Bed Mobility               General bed mobility comments: Pt sitting up in chair upon arrival, was able to scoot hips forward to edge of recliner in preparation for standing    Transfers Overall transfer level: Needs assistance Equipment used: Standard walker Transfers: Sit to/from Stand Sit to Stand: Min assist   Step pivot transfers: Min assist, +2 safety/equipment       General transfer comment: Required verbal cues for UE placement on RW. Pt started feeling excess fatigue after using toilet, requiring minA+2 for safety, step pivot to recliner HHA    Ambulation/Gait Ambulation/Gait assistance: Min assist Gait Distance (Feet): 22 Feet Assistive device: Rolling walker (2 wheels) Gait Pattern/deviations: Step-to pattern, Decreased stride length Gait velocity: decreased     General Gait Details: Pt highly distracted and required minA to focus on task and negotiation of obstacles from recliner to in-room bathroom   Stairs             Wheelchair Mobility    Modified Rankin (Stroke Patients Only)       Balance Overall balance assessment: Needs assistance Sitting-balance support: Feet unsupported, No upper extremity supported Sitting balance-Leahy Scale: Fair Sitting balance - Comments: Pt sitting unsupported in recliner with no LOB observed in any direction   Standing balance support: During functional activity, Bilateral upper extremity supported, Reliant on assistive device for balance Standing balance-Leahy Scale: Poor Standing balance comment: Reliant on RW while standing during transfer, excess cues to focus on safety with balance when overstimulated  Cognition Arousal/Alertness:  Awake/alert Behavior During Therapy: WFL for tasks assessed/performed Overall Cognitive Status: Impaired/Different from baseline Area of Impairment: Attention, Memory, Following commands, Safety/judgement, Awareness, Problem solving                   Current Attention Level: Sustained Memory: Decreased short-term memory Following Commands: Follows one step commands with increased time, Follows one step commands inconsistently Safety/Judgement: Decreased awareness of deficits, Decreased awareness of safety Awareness: Intellectual Problem Solving: Requires verbal cues, Requires tactile cues, Difficulty sequencing General Comments: Pt unable to dual task during session of mobility while communicating with daughter        Exercises      General Comments General comments (skin integrity, edema, etc.): Pt became fatigued after ambulation to the bathroom, requiring transfer to recliner at bathroom entrance. BP 124/60s after mobility. SpO2 >93% on 2L [place].      Pertinent Vitals/Pain Pain Assessment Faces Pain Scale: No hurt    Home Living                          Prior Function            PT Goals (current goals can now be found in the care plan section) Acute Rehab PT Goals Patient Stated Goal: to feel better PT Goal Formulation: With patient Time For Goal Achievement: 05/22/22 Potential to Achieve Goals: Good Progress towards PT goals: Progressing toward goals    Frequency    Min 3X/week      PT Plan Current plan remains appropriate    Co-evaluation              AM-PAC PT "6 Clicks" Mobility   Outcome Measure  Help needed turning from your back to your side while in a flat bed without using bedrails?: A Little Help needed moving from lying on your back to sitting on the side of a flat bed without using bedrails?: A Lot Help needed moving to and from a bed to a chair (including a wheelchair)?: A Little Help needed standing up from a chair  using your arms (e.g., wheelchair or bedside chair)?: A Little Help needed to walk in hospital room?: A Little Help needed climbing 3-5 steps with a railing? : Total 6 Click Score: 15    End of Session Equipment Utilized During Treatment: Gait belt;Oxygen Activity Tolerance: Patient tolerated treatment well Patient left: in chair;with family/visitor present;with call bell/phone within reach Nurse Communication: Mobility status;Other (comment) (No O2 extention on unit, left with tank) PT Visit Diagnosis: Muscle weakness (generalized) (M62.81);Other abnormalities of gait and mobility (R26.89)     Time: 4782-9562 PT Time Calculation (min) (ACUTE ONLY): 27 min  Charges:  $Therapeutic Activity: 23-37 mins                    Chipper Oman, SPT    [place] Benda 05/08/2022, 12:49 PM

## 2022-05-08 NOTE — Progress Notes (Signed)
Physical Therapy Treatment Patient Details Name: Yolanda [name] MRN: [id number] DOB: 10-25-1955 Today's Date: 05/08/2022   History of Present Illness Pt is 66 y/o F admitted to [facility] on 04/28/22 wih SOB, hypoxia, acute on chronic heart failure, Afib with RVR. Worsening hypoxic respiratory failure 10/22 with transfer to ICU; ETT 10/22-10/25. Of note, recent hosptial admission 10/11-10/19 with acute encephalopathy. PMH of anxiety, depression, fibroylagia, GERD, HTN, hypothyroidism, obesity, RA, PE, liver cirrhosis.    PT Comments    Pt up in recliner with legs dangling over edge upon arrival with nursing staff present. Pt improving and progressing with mobility during today's session. Pt demonstrated sit to stand and BSC transfer minA for steadying and excess cues for hand placement and safety. Pt remains limited by generalized weakness, decreased activity tolerance, cognitive deficits, and impaired balance strategies/postural reactions. Today's session limited due to pt needing excess time to void after transfer to [facility], hoping to follow again today to advance mobility. Continue to recommend acute PT services to maximize functional mobility and independence prior to d/c to AIR level therapies.    Recommendations for follow up therapy are one component of a multi-disciplinary discharge planning process, led by the attending physician.  Recommendations may be updated based on patient status, additional functional criteria and insurance authorization.  Follow Up Recommendations  Acute inpatient rehab (3hours/day) Can patient physically be transported by private vehicle: No   Assistance Recommended at Discharge Frequent or constant Supervision/Assistance  Patient can return home with the following Help with stairs or ramp for entrance;Assistance with cooking/housework;A lot of help with bathing/dressing/bathroom;Assist for transportation;A little help with walking and/or transfers   Equipment  Recommendations  Other (comment) (TBD)    Recommendations for Other Services       Precautions / Restrictions Precautions Precautions: Fall Restrictions Weight Bearing Restrictions: No     Mobility  Bed Mobility               General bed mobility comments: Pt sitting up in chair upon arrival    Transfers Overall transfer level: Needs assistance Equipment used: Standard walker Transfers: Sit to/from Stand, Bed to chair/wheelchair/BSC Sit to Stand: Min assist   Step pivot transfers: Min assist       General transfer comment: Required verbal cues for UE placement for improved success with ascent during sit to stand transfer. Pt able to demonstrate short small steps backwards to [facility]    Ambulation/Gait                   Stairs             Wheelchair Mobility    Modified Rankin (Stroke Patients Only)       Balance Overall balance assessment: Needs assistance Sitting-balance support: Feet unsupported, No upper extremity supported Sitting balance-Leahy Scale: Fair Sitting balance - Comments: Pt sitting unsupported in recliner with no LOB observed in any direction   Standing balance support: During functional activity, Bilateral upper extremity supported, Reliant on assistive device for balance Standing balance-Leahy Scale: Poor Standing balance comment: Reliant on RW while standing during transfer, unable to truly assess extent on RW need due facilitating quick transfer to [facility]                            Cognition Arousal/Alertness: Awake/alert Behavior During Therapy: WFL for tasks assessed/performed Overall Cognitive Status: Impaired/Different from baseline Area of Impairment: Attention, Memory, Following commands, Safety/judgement, Awareness, Problem solving  Current Attention Level: Sustained Memory: Decreased short-term memory Following Commands: Follows one step commands with increased time, Follows  one step commands inconsistently Safety/Judgement: Decreased awareness of deficits, Decreased awareness of safety Awareness: Intellectual Problem Solving: Requires verbal cues, Requires tactile cues, Difficulty sequencing General Comments: pt demonstrated short attention span during session and highly distractable by other people or conversations in the environment.        Exercises      General Comments General comments (skin integrity, edema, etc.): VSS during session, pt significantly more awake, aroused, and aware of surroundings today (including pt being much more chatty)      Pertinent Vitals/Pain Pain Assessment Faces Pain Scale: No hurt    Home Living                          Prior Function            PT Goals (current goals can now be found in the care plan section) Acute Rehab PT Goals Patient Stated Goal: no goal stated PT Goal Formulation: With patient Time For Goal Achievement: 05/18/22 Potential to Achieve Goals: Good Progress towards PT goals: Progressing toward goals    Frequency    Min 3X/week      PT Plan Current plan remains appropriate    Co-evaluation              AM-PAC PT "6 Clicks" Mobility   Outcome Measure  Help needed turning from your back to your side while in a flat bed without using bedrails?: A Little Help needed moving from lying on your back to sitting on the side of a flat bed without using bedrails?: A Lot Help needed moving to and from a bed to a chair (including a wheelchair)?: A Little Help needed standing up from a chair using your arms (e.g., wheelchair or bedside chair)?: A Little Help needed to walk in hospital room?: A Lot Help needed climbing 3-5 steps with a railing? : Total 6 Click Score: 14    End of Session Equipment Utilized During Treatment: Gait belt Activity Tolerance: Patient tolerated treatment well Patient left: with nursing/sitter in room;Other (comment) (on [facility] with call bell in  reach) Nurse Communication: Mobility status PT Visit Diagnosis: Muscle weakness (generalized) (M62.81);Other abnormalities of gait and mobility (R26.89)     Time: 4628-6381 PT Time Calculation (min) (ACUTE ONLY): 15 min  Charges:  $Therapeutic Activity: 8-22 mins                     Chipper Oman, SPT    [place] Ariza Evans 05/08/2022, 11:19 AM

## 2022-05-08 NOTE — Progress Notes (Signed)
Primary Cardiologist:  Mallipeddi  Subjective:  Worked with PT today appetite and nausea still an issue  Objective:  Vitals:   05/08/22 0800 05/08/22 0900 05/08/22 1100 05/08/22 1550  BP: 127/69 135/76 131/71 112/61  Pulse: 69   64  Resp: 15 17 15 17   Temp: 97.9 F (36.6 C)   98.9 F (37.2 C)  TempSrc: Oral   Oral  SpO2: 97%   100%  Weight:      Height:        Intake/Output from previous day:  Intake/Output Summary (Last 24 hours) at 05/08/2022 1717 Last data filed at 05/08/2022 1400 Gross per 24 hour  Intake 817.06 ml  Output 900 ml  Net -82.94 ml    Physical Exam: Chronically ill COPD with oxygen HEENT: normal Neck supple with no adenopathy JVP normal no bruits no thyromegaly Lungs clear with no wheezing and good diaphragmatic motion Heart:  S1/S2 no murmur, no rub, gallop or click PMI normal Abdomen: benighn, BS positve, no tenderness, no AAA no bruit.  No HSM or HJR Distal pulses intact with no bruits Plus one edema with stasis  Neuro non-focal Skin warm and dry No muscular weakness   Lab Results: Basic Metabolic Panel: Recent Labs    05/07/22 0426 05/08/22 0441  NA 138 139  K 4.3 3.1*  CL 92* 94*  CO2 35* 38*  GLUCOSE 97 100*  BUN 31* 27*  CREATININE 1.03* 1.05*  CALCIUM 10.3 10.2  MG  --  1.6*   Liver Function Tests: Recent Labs    05/06/22 0357 05/08/22 0441  AST 49* 46*  ALT 37 33  ALKPHOS 53 63  BILITOT 1.7* 1.3*  PROT 8.7* 8.5*  ALBUMIN 2.4* 2.1*   No results for input(s): "LIPASE", "AMYLASE" in the last 72 hours. CBC: Recent Labs    05/07/22 0426 05/08/22 0441  WBC 20.3* 11.8*  HGB 12.1 12.2  HCT 38.5 36.8  MCV 99.7 97.6  PLT 174 154     Imaging: No results found.  Cardiac Studies:  ECG: 10/25 SR rate 95 normal    Telemetry:  NSR 05/08/2022   Echo: 10/16  EF 70-75% normal RV trivial MR AV sclerosis   Medications:    apixaban  5 mg Oral BID   budesonide (PULMICORT) nebulizer solution  0.5 mg  Nebulization BID   Chlorhexidine Gluconate Cloth  6 each Topical Daily   DULoxetine  60 mg Oral Daily   feeding supplement  237 mL Oral TID BM   folic acid  1 mg Intravenous Daily   insulin aspart  0-5 Units Subcutaneous QHS   insulin aspart  0-9 Units Subcutaneous TID WC   lactulose  10 g Oral Daily   levothyroxine  75 mcg Oral Q0600   metoprolol tartrate  25 mg Oral BID   pantoprazole (PROTONIX) IV  40 mg Intravenous Q12H   potassium chloride  40 mEq Oral Daily   rifaximin  550 mg Oral BID   rosuvastatin  10 mg Oral Daily   spironolactone  50 mg Oral Daily   torsemide  20 mg Oral Daily      sodium chloride Stopped (04/30/22 0410)   amiodarone 30 mg/hr (05/08/22 1100)    Assessment/Plan:   Atrial flutter:  converted with amiodarone continue iv  Continue Eliquis for anticoagulation Can change to 200 mg PO when GI issues, appetite and nausea improved  COPD/:  chronic respiratory failure improved sats ok On Zosyn and nebs with 20 mg prednisone daily  Normally on 3L's oxygen at home Thyroid:  continue synthroid replacement Follow closely on amiodarone   DNR/DNI  Cardiology will sign off   Jenkins Rouge 05/08/2022, 5:17 PM

## 2022-05-08 NOTE — Progress Notes (Signed)
Inpatient Rehab Admissions Coordinator:  ? ?Per therapy recommendations,  patient was screened for CIR candidacy by Jemiah Cuadra, MS, CCC-SLP. At this time, Pt. Appears to be a a potential candidate for CIR. I will place   order for rehab consult per protocol for full assessment. Please contact me any with questions. ? ?Carsten Carstarphen, MS, CCC-SLP ?Rehab Admissions Coordinator  ?[phone] (celll) ?[phone] (office) ? ?

## 2022-05-09 ENCOUNTER — Inpatient Hospital Stay (HOSPITAL_COMMUNITY): Payer: Medicare Other

## 2022-05-09 DIAGNOSIS — R0600 Dyspnea, unspecified: Secondary | ICD-10-CM | POA: Diagnosis not present

## 2022-05-09 DIAGNOSIS — K7581 Nonalcoholic steatohepatitis (NASH): Secondary | ICD-10-CM | POA: Diagnosis not present

## 2022-05-09 DIAGNOSIS — K219 Gastro-esophageal reflux disease without esophagitis: Secondary | ICD-10-CM | POA: Diagnosis not present

## 2022-05-09 DIAGNOSIS — I480 Paroxysmal atrial fibrillation: Secondary | ICD-10-CM | POA: Diagnosis not present

## 2022-05-09 DIAGNOSIS — I5033 Acute on chronic diastolic (congestive) heart failure: Secondary | ICD-10-CM | POA: Diagnosis not present

## 2022-05-09 LAB — BASIC METABOLIC PANEL
Anion gap: 11 (ref 5–15)
BUN: 28 mg/dL — ABNORMAL HIGH (ref 8–23)
CO2: 37 mmol/L — ABNORMAL HIGH (ref 22–32)
Calcium: 10.2 mg/dL (ref 8.9–10.3)
Chloride: 91 mmol/L — ABNORMAL LOW (ref 98–111)
Creatinine, Ser: 1.21 mg/dL — ABNORMAL HIGH (ref 0.44–1.00)
GFR, Estimated: 49 mL/min — ABNORMAL LOW (ref >60)
Glucose, Bld: 195 mg/dL — ABNORMAL HIGH (ref 70–99)
Potassium: 3.7 mmol/L (ref 3.5–5.1)
Sodium: 139 mmol/L (ref 135–145)

## 2022-05-09 LAB — CBC
HCT: 38.1 % (ref 36.0–46.0)
Hemoglobin: 12.5 g/dL (ref 12.0–15.0)
MCH: 32.3 pg (ref 26.0–34.0)
MCHC: 32.8 g/dL (ref 30.0–36.0)
MCV: 98.4 fL (ref 80.0–100.0)
Platelets: 159 10*3/uL (ref 150–400)
RBC: 3.87 MIL/uL (ref 3.87–5.11)
RDW: 16.9 % — ABNORMAL HIGH (ref 11.5–15.5)
WBC: 10.1 10*3/uL (ref 4.0–10.5)
nRBC: 0 % (ref 0.0–0.2)

## 2022-05-09 LAB — GLUCOSE, CAPILLARY
Glucose-Capillary: 88 mg/dL (ref 70–99)
Glucose-Capillary: 121 mg/dL — ABNORMAL HIGH (ref 70–99)
Glucose-Capillary: 86 mg/dL (ref 70–99)
Glucose-Capillary: 109 mg/dL — ABNORMAL HIGH (ref 70–99)

## 2022-05-09 MED ORDER — ENSURE ENLIVE PO LIQD
237.0000 mL | Freq: Two times a day (BID) | ORAL | Status: DC
  Administered 2022-05-10 – 2022-05-19 (×17): 237 mL via ORAL

## 2022-05-09 MED ORDER — FOLIC ACID 1 MG PO TABS
1.0000 mg | ORAL_TABLET | Freq: Every day | ORAL | Status: DC
  Administered 2022-05-09 – 2022-05-19 (×11): 1 mg via ORAL
  Filled 2022-05-09 (×11): qty 1

## 2022-05-09 MED ORDER — ADULT MULTIVITAMIN W/MINERALS CH
1.0000 | ORAL_TABLET | Freq: Every day | ORAL | Status: DC
  Administered 2022-05-10 – 2022-05-19 (×10): 1 via ORAL
  Filled 2022-05-09 (×10): qty 1

## 2022-05-09 MED ORDER — MAGIC MOUTHWASH
5.0000 mL | Freq: Three times a day (TID) | ORAL | Status: DC | PRN
  Administered 2022-05-10: 5 mL via ORAL
  Filled 2022-05-09 (×2): qty 5

## 2022-05-09 NOTE — Progress Notes (Signed)
  Inpatient Rehabilitation Admissions Coordinator   Met with patient at bedside for rehab assessment. We discussed goals and expectations of a possible CIR admit. She prefers CIR for rehab. Family can provide expected caregiver support that is recommended by [name] , her daughter.I await further progress as nausea resolves and to see functionally where she is at that time to determine if CIR admit needed before discharge home. Please call me with any questions.   Danne Baxter, RN, MSN Rehab Admissions Coordinator [phone]

## 2022-05-09 NOTE — Progress Notes (Signed)
Occupational Therapy Treatment Patient Details Name: Yolanda [name] MRN: [id number] DOB: 1955-12-05 Today's Date: 05/09/2022   History of present illness Pt is 66 y/o F admitted to [facility] on 04/28/22 wih SOB, hypoxia, acute on chronic heart failure, Afib with RVR. Worsening hypoxic respiratory failure 10/22 with transfer to ICU; ETT 10/22-10/25. Of note, recent hosptial admission 10/11-10/19 with acute encephalopathy. PMH of anxiety, depression, fibroylagia, GERD, HTN, hypothyroidism, obesity, RA, PE, liver cirrhosis.   OT comments  Pt progressing towards goals this session, completing ADLs with min A, min A for bed mobility and transfers with RW. Pt with rating fatigue 6/10 after completing UB/LB bathing and walking short distance in room. VSS on 2L O2 throughout. Pt c/o nausea, RN notified. Pt presenting with impairments listed below, will follow acutely. Continue to recommend AIR at d/c.   Recommendations for follow up therapy are one component of a multi-disciplinary discharge planning process, led by the attending physician.  Recommendations may be updated based on patient status, additional functional criteria and insurance authorization.    Follow Up Recommendations  Acute inpatient rehab (3hours/day)    Assistance Recommended at Discharge Frequent or constant Supervision/Assistance  Patient can return home with the following  Assistance with cooking/housework;Direct supervision/assist for medications management;Direct supervision/assist for financial management;Assist for transportation;Help with stairs or ramp for entrance;A little help with walking and/or transfers;A little help with bathing/dressing/bathroom   Equipment Recommendations  None recommended by OT (defer)    Recommendations for Other Services      Precautions / Restrictions Precautions Precautions: Fall Restrictions Weight Bearing Restrictions: No       Mobility Bed Mobility Overal bed mobility: Needs  Assistance Bed Mobility: Supine to Sit, Sit to Supine     Supine to sit: Min assist Sit to supine: Min assist        Transfers Overall transfer level: Needs assistance Equipment used: Standard walker Transfers: Sit to/from Stand Sit to Stand: Min assist                 Balance Overall balance assessment: Needs assistance Sitting-balance support: Feet unsupported, No upper extremity supported Sitting balance-Leahy Scale: Fair Sitting balance - Comments: Pt sitting unsupported in recliner with no LOB observed in any direction   Standing balance support: During functional activity, Bilateral upper extremity supported, Reliant on assistive device for balance Standing balance-Leahy Scale: Poor Standing balance comment: Reliant on RW while standing during transfer, excess cues to focus on safety with balance when overstimulated                           ADL either performed or assessed with clinical judgement   ADL Overall ADL's : Needs assistance/impaired Eating/Feeding: Set up;Sitting       Upper Body Bathing: Minimal assistance;Sitting Upper Body Bathing Details (indicate cue type and reason): washing BUE seated on BSC Lower Body Bathing: Minimal assistance Lower Body Bathing Details (indicate cue type and reason): for pericare Upper Body Dressing : Minimal assistance;Sitting Upper Body Dressing Details (indicate cue type and reason): to don gown Lower Body Dressing: Minimal assistance;Sitting/lateral leans;Sit to/from stand   Toilet Transfer: Minimal assistance;BSC/3in1;Ambulation;Rolling walker (2 wheels) Toilet Transfer Details (indicate cue type and reason): simulated via functional mobility Toileting- Clothing Manipulation and Hygiene: Minimal assistance       Functional mobility during ADLs: Min guard;Rolling walker (2 wheels)      Extremity/Trunk Assessment Upper Extremity Assessment Upper Extremity Assessment: Generalized weakness   Lower  Extremity  Assessment Lower Extremity Assessment: Defer to PT evaluation        Vision   Vision Assessment?: No apparent visual deficits   Perception Perception Perception: Not tested   Praxis Praxis Praxis: Not tested    Cognition Arousal/Alertness: Awake/alert Behavior During Therapy: WFL for tasks assessed/performed Overall Cognitive Status: Impaired/Different from baseline Area of Impairment: Attention, Memory, Following commands, Safety/judgement, Awareness, Problem solving                   Current Attention Level: Sustained   Following Commands: Follows one step commands with increased time, Follows one step commands inconsistently Safety/Judgement: Decreased awareness of deficits, Decreased awareness of safety Awareness: Emergent Problem Solving: Slow processing, Decreased initiation General Comments: increased time to perform tasks due to nausea?        Exercises      Shoulder Instructions       General Comments VSS on 2L O2    Pertinent Vitals/ Pain       Pain Assessment Pain Assessment: No/denies pain Pain Intervention(s): Limited activity within patient's tolerance, Monitored during session  Home Living                                          Prior Functioning/Environment              Frequency  Min 2X/week        Progress Toward Goals  OT Goals(current goals can now be found in the care plan section)  Progress towards OT goals: Progressing toward goals  Acute Rehab OT Goals Patient Stated Goal: to go home/rehab OT Goal Formulation: With patient Time For Goal Achievement: 05/19/22 Potential to Achieve Goals: Fair ADL Goals Pt Will Perform Eating: with mod assist;sitting Pt Will Perform Grooming: with mod assist;sitting Pt Will Perform Upper Body Dressing: with mod assist;sitting Pt/caregiver will Perform Home Exercise Program: Increased strength;Both right and left upper extremity;With minimal  assist Additional ADL Goal #1: Pt will perform bed mobility with moderate assistance as a precursor to ADLs. Additional ADL Goal #2: Pt demonstrate fair sitting balance x 10 minutes in preparation for ADLs. Additional ADL Goal #3: Pt will demonstrate sustained attention in preparation for ADLs.  Plan Discharge plan remains appropriate;Frequency remains appropriate    Co-evaluation                 AM-PAC OT "6 Clicks" Daily Activity     Outcome Measure   Help from another person eating meals?: A Little Help from another person taking care of personal grooming?: A Little Help from another person toileting, which includes using toliet, bedpan, or urinal?: A Little Help from another person bathing (including washing, rinsing, drying)?: A Lot Help from another person to put on and taking off regular upper body clothing?: A Little Help from another person to put on and taking off regular lower body clothing?: A Lot 6 Click Score: 16    End of Session Equipment Utilized During Treatment: Oxygen  OT Visit Diagnosis: Muscle weakness (generalized) (M62.81);Other symptoms and signs involving cognitive function   Activity Tolerance Patient tolerated treatment well   Patient Left in bed;with call bell/phone within reach;with bed alarm set   Nurse Communication          Time: 0277-4128 OT Time Calculation (min): 28 min  Charges: OT General Charges $OT Visit: 1 Visit OT Treatments $Self Care/Home Management : 8-22  mins $Therapeutic Activity: 8-22 mins  Lynnda Child, OTD, OTR/L Acute Rehab [phone] - [place] 05/09/2022, 2:26 PM

## 2022-05-09 NOTE — Progress Notes (Signed)
PT Cancellation Note  Patient Details Name: Yolanda [name] MRN: [id number] DOB: 1956-02-28   Cancelled Treatment:    Reason Eval/Treat Not Completed: Patient at procedure or test/unavailable  2nd follow-up today. Patient off unit for testing. Will make priority to be seen tomorrow for PT.  Ellouise Newer 05/09/2022, 3:57 PM

## 2022-05-09 NOTE — Progress Notes (Addendum)
PROGRESS NOTE    Yolanda [name]  JFH:[id number] DOB: [birth year] DOA: 04/28/2022 PCP: Celene Squibb, MD  66/F chronically ill with [name] cirrhosis, diastolic CHF, obesity, PE not on AC, A-fib not on AC, chronic respiratory failure on 3L [place] O2,  OSA, hypertension, anxiety, depression, GERD, rheumatoid arthritis, hypothyroidism.  She was recently admitted at [facility] 10/11-10/19 with AMS and acute cystitis.   Work-up was negative for PE, found to have new onset Afib, multi-focal PNA and effusion and was diuresed and discharged home on 10/19 on antibiotics. -Return to the ED 10/20 with worsening shortness of breath and hypoxia, placed on BiPAP, CXR again showed multi-focal bilateral infiltrates, restarted antibiotics and diuretics on 10/22 her respiratory status began to decline despite bipap and PCCM was consulted for transfer to the ICU 10/22 intubated 10/25: Extubated, remained encephalopathic 10/26: started on amio gtt for Afib/RVR Transferred to [facility] service 10/28 10/27-10/29 with intermittent bursts of rapid A-fib-cards consulting-> required multiple boluses of IV amiodarone while on amiodarone gtt.   Subjective: -Feels a little better still nauseated  Assessment and Plan:  Acute on Chronic Hypoxic Respiratory Failure secondary to Multifocal PNA: Aspiration Acute on chronic HFpEF with pulmonary edema -Extubated 10/25 -Concern for recurrent aspiration, possibly esophageal, SLP eval noted -Completed 7 days of IV Zosyn on 10/29, leukocytosis worsened, no new resp symptoms, Foley catheter was removed, UA unremarkable, left subclavian central line removed, blood cultures negative so far, leukocytosis now resolved -Pulmonary toilet, aspiration precautions, wean O2 as tolerated -Activity, PT eval-> CIR recommended  Dysphagia -Barium esophagram attempted on Friday, could not be completed-patient was vomiting could not tolerate study, will reorder today -Aspiration precautions -Tolerating current  diet at this time  Acute on chronic diastolic CHF Decompensated liver cirrhosis with fluid overload -Echo 10/23 with EF of 93-73%, grade 1 diastolic dysfunction, preserved RV function -Diuresed with IV Lasix, appears euvolemic now -She is 15 L negative -Albumin is low at baseline with liver disease, switch over to torsemide yesterday, continue Aldactone   Atrial Fibrillation with RVR  -Required IV amiodarone bolus last night and few nights ago -Continue Lopressor and Eliquis -Now on Amio gtt., cardiology consulting, repleted potassium,  -Per cards change to p.o. amiodarone when nausea has improved   hypokalemia -Replaced   Acute metabolic encephalopathy.   -Suspect mixed picture of post infection, also hepatic encephalopathic component -Improving, completing antibiotic course, repeat ammonia level was normal   NASH liver cirrhosis  -Continue rifaximin, lactulose -Diuretics, now on torsemide and Aldactone   Type 2 DM -Holding metformin continue sliding scale insulin   Hypothyroidism -Continue levothyroxine   GERD -Esophagram PPI and Carafate  Class 3 obesity (HCC) Calculated BMI is 44.3 Patient with very poor physical functional status, uses a walker for ambulation.   Depression and anxiety,  Continue with daily duloxetine.  Discontinued alprazolam.   DVT prophylaxis: Eliquis Code Status: DNR Family Communication: no Family at bedside, updated daughter [name] 10/28 Disposition Plan: Transfer to progressive  Consultants: PCCM   Procedures:   Antimicrobials:    Objective: Vitals:   05/08/22 2330 05/09/22 0153 05/09/22 0342 05/09/22 0902  BP: 120/63  94/60   Pulse: 66  62   Resp: 18  18   Temp: 98 F (36.7 C)  98 F (36.7 C)   TempSrc: Oral  Oral   SpO2: 97%  100% 93%  Weight:  106.2 kg    Height:        Intake/Output Summary (Last 24 hours) at 05/09/2022 4287 Last data  filed at 05/09/2022 0500 Gross per 24 hour  Intake 485.57 ml  Output 1400 ml   Net -914.43 ml   Filed Weights   05/07/22 0500 05/08/22 0500 05/09/22 0153  Weight: 103.7 kg 107.4 kg 106.2 kg    Examination:  General exam: Obese female sitting up in bed, appears older than stated age, AAOx3 HEENT: Neck obese unable to assess JVD CVS: S1-S2, regular rhythm Lungs: Decreased breath sounds at the bases Abdomen: Soft, obese, nontender, bowel sounds present Extremities: No edema Neuro: Moves all extremities, no localizing signs Skin: No rashes Psychiatry:  Mood & affect appropriate.     Data Reviewed:   CBC: Recent Labs  Lab 05/05/22 0359 05/06/22 0357 05/07/22 0426 05/08/22 0441 05/09/22 0054  WBC 19.0* 19.0* 20.3* 11.8* 10.1  HGB 12.8 12.3 12.1 12.2 12.5  HCT 39.6 38.8 38.5 36.8 38.1  MCV 99.5 100.3* 99.7 97.6 98.4  PLT 203 161 174 154 518   Basic Metabolic Panel: Recent Labs  Lab  0000 05/02/22 1704 05/03/22 0249 05/03/22 1615 05/03/22 2216 05/04/22 0425 05/04/22 2221 05/05/22 0359 05/06/22 0357 05/07/22 0426 05/08/22 0441 05/09/22 0054  NA  --   --  149*   < > 143 141 141 141 140 138 139 139  K  --   --  3.4*   < > 3.6 3.1* 4.9 4.4 3.8 4.3 3.1* 3.7  CL  --   --  108   < > 98 97* 96* 99 93* 92* 94* 91*  CO2  --   --  35*   < > 33* 35* 34* 35* 38* 35* 38* 37*  GLUCOSE  --   --  142*   < > 118* 103* 145* 114* 128* 97 100* 195*  BUN  --   --  41*   < > 33* 32* 36* 35* 37* 31* 27* 28*  CREATININE  --   --  0.86   < > 1.12* 0.89 1.14* 1.04* 1.09* 1.03* 1.05* 1.21*  CALCIUM  --   --  9.7   < > 10.5* 10.4* 10.3 10.0 10.7* 10.3 10.2 10.2  MG   < >  --  2.4  --  1.9 1.8 2.1 2.1  --   --  1.6*  --   PHOS  --  4.9* 2.4*  --   --   --   --  3.6  --   --   --   --    < > = values in this interval not displayed.   GFR: Estimated Creatinine Clearance: 53.6 mL/min (A) (by C-G formula based on SCr of 1.21 mg/dL (H)). Liver Function Tests: Recent Labs  Lab 05/03/22 1615 05/05/22 1233 05/06/22 0357 05/08/22 0441  AST 38 49* 49* 46*  ALT 23 33  37 33  ALKPHOS 54 61 53 63  BILITOT 1.3* 1.3* 1.7* 1.3*  PROT 9.8* 9.5* 8.7* 8.5*  ALBUMIN 2.7* 2.4* 2.4* 2.1*   No results for input(s): "LIPASE", "AMYLASE" in the last 168 hours. Recent Labs  Lab 05/03/22 0249 05/05/22 0815  AMMONIA 23 28   Coagulation Profile: Recent Labs  Lab 05/04/22 0425  INR 1.8*   Cardiac Enzymes: No results for input(s): "CKTOTAL", "CKMB", "CKMBINDEX", "TROPONINI" in the last 168 hours. BNP (last 3 results) No results for input(s): "PROBNP" in the last 8760 hours. HbA1C: No results for input(s): "HGBA1C" in the last 72 hours. CBG: Recent Labs  Lab 05/08/22 0653 05/08/22 1100 05/08/22 1548 05/08/22 2055 05/09/22 8416  GLUCAP 160* 91 108* 105* 88   Lipid Profile: No results for input(s): "CHOL", "HDL", "LDLCALC", "TRIG", "CHOLHDL", "LDLDIRECT" in the last 72 hours. Thyroid Function Tests: No results for input(s): "TSH", "T4TOTAL", "FREET4", "T3FREE", "THYROIDAB" in the last 72 hours. Anemia Panel: No results for input(s): "VITAMINB12", "FOLATE", "FERRITIN", "TIBC", "IRON", "RETICCTPCT" in the last 72 hours. Urine analysis:    Component Value Date/Time   COLORURINE YELLOW 05/07/2022 1236   APPEARANCEUR CLEAR 05/07/2022 1236   APPEARANCEUR Clear 03/08/2022 1549   LABSPEC 1.015 05/07/2022 1236   PHURINE 7.5 05/07/2022 1236   GLUCOSEU NEGATIVE 05/07/2022 1236   HGBUR TRACE (A) 05/07/2022 1236   BILIRUBINUR NEGATIVE 05/07/2022 1236   BILIRUBINUR Negative 03/08/2022 [place] 05/07/2022 1236   PROTEINUR NEGATIVE 05/07/2022 1236   UROBILINOGEN negative (A) 07/02/2019 0932   UROBILINOGEN 0.2 04/15/2015 0028   NITRITE NEGATIVE 05/07/2022 1236   LEUKOCYTESUR NEGATIVE 05/07/2022 1236   Sepsis Labs: _0 (procalcitonin:4,lacticidven:4)  ) Recent Results (from the past 240 hour(s))  Culture, BAL-quantitative w Gram Stain     Status: None   Collection Time: 04/30/22  3:36 PM   Specimen: Bronchoalveolar Lavage;  Respiratory  Result Value Ref Range Status   Specimen Description BRONCHIAL ALVEOLAR LAVAGE  Final   Special Requests Normal  Final   Gram Stain   Final    RARE WBC PRESENT, PREDOMINANTLY PMN NO ORGANISMS SEEN    Culture   Final    NO GROWTH 2 DAYS Performed at [facility], [place] [address] [place], [place] [address]    Report Status 05/02/2022 FINAL  Final  MRSA Next Gen by PCR, Nasal     Status: None   Collection Time: 05/01/22  4:11 AM   Specimen: Nasal Mucosa; Nasal Swab  Result Value Ref Range Status   MRSA by PCR Next Gen NOT DETECTED NOT DETECTED Final    Comment: (NOTE) The GeneXpert MRSA Assay (FDA approved for NASAL specimens only), is one component of a comprehensive MRSA colonization surveillance program. It is not intended to diagnose MRSA infection nor to guide or monitor treatment for MRSA infections. Test performance is not FDA approved in patients less than 59 years old. Performed at [facility], [place] [address] [place], [place] [address]   Culture, blood (Routine X 2) w Reflex to ID Panel     Status: None (Preliminary result)   Collection Time: 05/07/22  8:57 AM   Specimen: BLOOD  Result Value Ref Range Status   Specimen Description BLOOD RIGHT ANTECUBITAL  Final   Special Requests IN PEDIATRIC BOTTLE Blood Culture adequate volume  Final   Culture   Final    NO GROWTH 2 DAYS Performed at [facility], [place] [address] [place], [place] [address]    Report Status PENDING  Incomplete  Culture, blood (Routine X 2) w Reflex to ID Panel     Status: None (Preliminary result)   Collection Time: 05/07/22  9:15 AM   Specimen: BLOOD LEFT ARM  Result Value Ref Range Status   Specimen Description BLOOD LEFT ARM  Final   Special Requests   Final    BOTTLES DRAWN AEROBIC ONLY Blood Culture results may not be optimal due to an inadequate volume of blood received in culture bottles   Culture   Final    NO GROWTH 2 DAYS Performed at [place], [place] [address] [place], [place] [address]    Report Status PENDING  Incomplete  Urine Culture     Status: Abnormal   Collection Time: 05/07/22 12:36 PM   Specimen: Urine, Clean Catch  Result Value Ref Range Status   Specimen Description URINE, CLEAN CATCH  Final   Special Requests NONE  Final   Culture (A)  Final    <10,000 COLONIES/mL INSIGNIFICANT GROWTH Performed at [facility], [address], [place], [place] [address]    Report Status 05/08/2022 FINAL  Final     Radiology Studies: No results found.   Scheduled Meds:  apixaban  5 mg Oral BID   budesonide (PULMICORT) nebulizer solution  0.5 mg Nebulization BID   Chlorhexidine Gluconate Cloth  6 each Topical Daily   DULoxetine  60 mg Oral Daily   feeding supplement  237 mL Oral TID BM   folic acid  1 mg Intravenous Daily   insulin aspart  0-5 Units Subcutaneous QHS   insulin aspart  0-9 Units Subcutaneous TID WC   lactulose  10 g Oral Daily   levothyroxine  75 mcg Oral Q0600   metoprolol tartrate  25 mg Oral BID   pantoprazole (PROTONIX) IV  40 mg Intravenous Q12H   potassium chloride  40 mEq Oral Daily   rifaximin  550 mg Oral BID   rosuvastatin  10 mg Oral Daily   spironolactone  50 mg Oral Daily   torsemide  20 mg Oral Daily   Continuous Infusions:  sodium chloride Stopped (04/30/22 0410)   amiodarone 30 mg/hr (05/08/22 2211)     LOS: 11 days    Time spent: 51mn    PDomenic Polite MD [facility]   05/09/2022, 9:36 AM

## 2022-05-09 NOTE — Progress Notes (Signed)
Palliative Care Progress Note, Assessment & Plan   Patient Name: Yolanda [name]       Date: 05/09/2022 DOB: 09/17/1955  Age: 67 y.o. MRN#: [id number] Attending Physician: Domenic Polite, MD Primary Care Physician: Celene Squibb, MD Admit Date: 04/28/2022  Reason for Consultation/Follow-up: Establishing goals of care  HPI: Patient is a 66 year old female with PMH significant for [place] cirrhosis, diastolic CHF, obesity, OSA, HTN, anxiety/depression, GERD, RA, hypothyroidism, PE and A-fib (not on [facility]), and chronic respiratory failure (3 L nasal cannula O2 home at baseline) admitted on 04/28/2022 with SOB/hypoxia, placed on BiPAP, and chest x-ray imaging revealed multifocal bilateral infiltrates.  Patient was treated with antibiotics and diuretics.  Respiratory status declined, patient was transferred to ICU, intubated on 10/22, extubated on 10/25, and encephalopathy remained.  On 10/26 patient was started on amiodarone gtt. for A-fib with RVR.  Plan is to convert to p.o. amiodarone once nausea subsides.  PMT was consulted to discuss goals of care.  Summary of counseling/coordination of care: After reviewing the patient's chart and assessing the patient at bedside, I discussed symptom management with the patient.  Her biggest concern is her consistent mild nausea with hiccups.  We reviewed patient has as needed Zofran available and it has not been given recently.  Patient in agreement and RN made aware to give dose of Zofran and we will follow her response and titrate/change medications as needed to minimize nausea.  Patient also has complaints of pain in back of throat that she contributes to intubation.  Recommended use of Magic mouthwash 3 times daily as needed.    Patient shares that orange juice helps  take the taste out of her mouth.  Reviewed this might irritate her throat due to its acidic nature and that irritants like this should be avoided/minimized.  Therapeutic silence, active listening, and emotional support provided.  Patient continues to work with spiritual care to determine HCPOA.  DNR remains.  PMT will continue to follow patient throughout her hospitalization.  Physical Exam Constitutional:      General: She is not in acute distress.    Appearance: She is obese. She is not ill-appearing.  Eyes:     Pupils: Pupils are equal, round, and reactive to light.  Cardiovascular:     Rate and Rhythm: Normal rate.  Pulmonary:     Effort: Pulmonary effort is normal.  Abdominal:     Palpations: Abdomen is soft.  Musculoskeletal:        General: Normal range of motion.  Skin:    General: Skin is warm and dry.     Coloration: Skin is jaundiced.  Neurological:     Mental Status: She is alert and oriented to person, place, and time.  Psychiatric:        Mood and Affect: Mood normal.        Behavior: Behavior normal.        Thought Content: Thought content normal.        Judgment: Judgment normal.             Palliative Assessment/Data: 60%    Total Time 35 minutes  Greater than 50%  of this time was spent counseling and coordinating  care related to the above assessment and plan.  Thank you for allowing the Palliative Medicine Team to assist in the care of this patient.  McLaughlin Ilsa Iha, FNP-BC Palliative Medicine Team Team Phone # [phone]

## 2022-05-09 NOTE — Progress Notes (Signed)
   05/09/22 1056  Mobility  Activity Ambulated with assistance to bathroom  Level of Assistance Contact guard assist, steadying assist  Assistive Device Front wheel walker  Distance Ambulated (ft) 10 ft  Activity Response Tolerated well  Mobility Referral Yes  $Mobility charge 1 Mobility   Mobility Specialist Progress Note  Pt EOB and agreeable. No c/o pain throughout ambulation. Left in BR w/all needs met. RN notified.   Lucious Groves Mobility Specialist

## 2022-05-09 NOTE — Care Management Important Message (Signed)
Important Message  Patient Details  Name: Yolanda [name] MRN: [id number] Date of Birth: 1956/04/20   Medicare Important Message Given:  Yes     Shelda Altes 05/09/2022, 10:40 AM

## 2022-05-09 NOTE — Progress Notes (Addendum)
Nutrition Follow-up  DOCUMENTATION CODES:   Non-severe (moderate) malnutrition in context of chronic illness  INTERVENTION:  - Modify to Ensure Enlive po BID, each supplement provides 350 kcal and 20 grams of protein.  - Add MVI q day.   NUTRITION DIAGNOSIS:   Moderate Malnutrition related to acute illness, chronic illness (hepatic encephalopathy, respiratory distress, NASH) as evidenced by mild muscle depletion, percent weight loss (12% weight loss within 3 months).  GOAL:   Patient will meet greater than or equal to 90% of their needs  MONITOR:   Vent status, TF tolerance, Labs, I & O's  REASON FOR ASSESSMENT:   Consult Enteral/tube feeding initiation and management  ASSESSMENT:   66 yo female admitted with hypoxia and worsening SOB. PMH includes NASH cirrhosis, CHF, PE, A fib, OSA, HTN, chronic respiratory failure on 3 L oxygen via [place] at baseline, GERD, depression, hypothyroidism. Recent admission at [facility] for new onset A fib and PNA.  Meds include:  Ensure TID, folic acid, sliding scale insulin, Klor-con, aldactone.   Pt reports that she does not have much of an appetite for the food here at the hospital. Per record, pt ate about 45% of her dinner last night. Pt states that she wants her salt and sugar. RD discussed the importance of following the 2gmNa diet in setting of CHF. Pt did agree to try Ensure in between meals. Pt has supplements ordered TID, RD will modify to BID.   Pt reports that her weight loss PTA was intentional and she has been trying to lose weight. RD discussed the importance of not losing weight too quickly to preserve lean muscle mass.   Diet Order:   Diet Order             Diet 2 gram sodium Room service appropriate? Yes; Fluid consistency: Thin  Diet effective now                   EDUCATION NEEDS:   No education needs have been identified at this time  Skin:  Skin Assessment: Reviewed RN Assessment  Last BM:  05/08/22  Height:    Ht Readings from Last 1 Encounters:  05/03/22 5' 4"  (1.626 m)    Weight:   Wt Readings from Last 1 Encounters:  05/09/22 106.2 kg    Ideal Body Weight:  54.5 kg  BMI:  Body mass index is 40.2 kg/m.  Estimated Nutritional Needs:   Kcal:  1650-1850  Protein:  110-135 gm  Fluid:  1.7-1.8 L  Thalia Bloodgood, RD, LDN, CNSC

## 2022-05-09 NOTE — Progress Notes (Signed)
This chaplain is present for F/U spiritual care in the space of creating/updating the Pt. Advance Directive.  The chaplain listened reflectively as the Pt. discerns the role of HCPOA and her thoughts around her daughters serving in this role. The chaplain understands the Pt. lives with [name]. The Pt. has chosen to pause on choosing a HCPOA as she reflects on what is best in caring well for herself.  The chaplain understands the Pt. is hopeful for strong communication among the healthcare team as the Pt. prepares for discharge.    This chaplain is available for F/U spiritual care as needed.  Chaplain Sallyanne Kuster [phone]

## 2022-05-09 NOTE — Consult Note (Signed)
   [facility] CM Inpatient Consult   05/09/2022  Yolanda [name] [birth year] [id number]   [facility] [ACO] Patient: Medicare ACO REACH  Primary Care Provider:  Celene Squibb, MD, this provider is listed for the Transition of Care [TOC] follow up  Patient screened for less than 7 days readmission, length of stay, recent transition from ICU hospitalization with noted extreme high risk score for unplanned readmission risk. Reviewed to assess for potential [place] [facility] service needs for post hospital transition.  Patient was discussed in unit progression meeting for post hospital needs and barriers to transition. Patient with ongoing medical management needs noted in electronic medical record review.  Also, reviewed inpatient [facility] RNCM notes regarding patient [employer]. She is currently being recommended for an inpatient rehabilitation program for transition.  Plan:  Continue to follow progress and disposition to assess for post hospital care management needs. If patient returns home or a [facility] affiliated facility patient can be followed for community care coordination.   For questions contact:   Natividad Brood, RN BSN [place] [facility]  [phone] business mobile phone Toll free office [phone]  *[place]  [phone] Fax number: [phone] [email or web address]

## 2022-05-09 NOTE — Progress Notes (Signed)
Overnight progress note  Notified by patient's primary nurse that patient had a witnessed fall as nursing staff were assisting her to go to the bedside commode.  No head injury or loss of consciousness reported.  After the fall, patient complained of pain in her left knee and right big toe but no pain anywhere else.  She is able to bend her knee.  Currently resting comfortably.  Vital signs stable.  -Stat x-rays ordered to rule out fracture -Fall precautions

## 2022-05-09 NOTE — Progress Notes (Signed)
   05/09/22 1129  Mobility  Activity Ambulated with assistance to bathroom  Level of Assistance Standby assist, set-up cues, supervision of patient - no hands on  Assistive Device Front wheel walker  Distance Ambulated (ft) 10 ft  Activity Response Tolerated well  Mobility Referral Yes  $Mobility charge 1 Mobility   Mobility Specialist Progress Note  Pt requesting help to get back to bed. Mobility cut short d/t fatigue after BM. Returned to bed w/ all needs met and call bell in reach.   Lucious Groves Mobility Specialist

## 2022-05-09 NOTE — Progress Notes (Signed)
Was in another pt's room when Charge, RN came and told me to go to pt's room that the pt had a witnessed fall. Pt stated we walker caught the leg of the bsc and she fell to the floor. Hospital socks were on pt's feet. There was no injury to her head. She c/o of soreness to her left knee and right big toe. Spoke with On-Call MD, and x-rays are being ordered. Pt has no complaints at this time and is resting in bed comfortably with call bell in reach and bed alarm on.    05/09/22 2353  Vitals  Temp 97.9 F (36.6 C)  Temp Source Oral  BP (!) 103/59  MAP (mmHg) 79  BP Location Right Arm  BP Method Automatic  Patient Position (if appropriate) Lying  Pulse Rate (!) 58  Pulse Rate Source Monitor  Resp 16  Level of Consciousness  Level of Consciousness Alert  MEWS COLOR  MEWS Score Color Green  Oxygen Therapy  SpO2 100 %  O2 Device Nasal Cannula  O2 Flow Rate (L/min) 2 L/min  Pain Assessment  Pain Scale 0-10  Pain Score 0  MEWS Score  MEWS Temp 0  MEWS Systolic 0  MEWS Pulse 0  MEWS RR 0  MEWS LOC 0  MEWS Score 0

## 2022-05-09 NOTE — Progress Notes (Addendum)
Patient requested to use bathroom. Suggested use of bedside commode instead due to ongoing mobility concerns, supplemental oxygen requirement, and amiodarone drip. Patient amenable to bedside commode. Patient fell en route, guided by this RN and NT. No head contact with any surface observed by those present, also denied by patient. Complaints of pain in both great toes and knees but otherwise denies distress. Primary RN informed of event. On call hospitalist paged at [phone].  Patient now states that pain is in left knee and right great toe. Primary RN notified.

## 2022-05-09 NOTE — Progress Notes (Signed)
Speech Language Pathology Treatment: Dysphagia  Patient Details Name: Yolanda [name] MRN: [id number] DOB: Nov 24, 1955 Today's Date: 05/09/2022 Time: 3299-2426 SLP Time Calculation (min) (ACUTE ONLY): 21 min  Assessment / Plan / Recommendation Clinical Impression   Pt was seen for f/u after MBS with results of esophagram also noted. Education was provided about oropharyngeal swallow that was observed to be [facility], but also providing aspiration and esophageal precautions. Pt was observed during breakfast meal, primarily eating her cereal mixed with milk. No overt s/s of aspiration or oropharyngeal dysphagia observed. Note that pt says she was supposed to be following up with GI on an OP basis but ended up in the hospital before this could be done. Recommend considering additional f/u for esophageal component per MD discretion. SLP to sign off acutely.   HPI HPI: Pt is a 66 y.o. female who presented with SOB and fatigue. CXR on admission revealed multifocal pneumonia. ETT 10/22-10/25. Family reported to ordering MD that pt has a fairly significant history of poorly controlled reflux, choking after meals, and at times choking waking up at night. Plan for esopghagram when pt is medically stable. Pt recently admitted at [facility] 10/11-10/19 with AMS and acute cystitis. Pt found to have new onset Afib, multi-focal PNA and effusion and was diuresed and discharged home. PMH: cirrhosis secondary to NASH with recent hepatic encephalopathy, PAF not on anticoagulation, chronic HFpEF, recent multifocal pneumonia, hypothyroidism, GERD, morbid obesity, COPD Gold stage II, esophagram 04/13/17 WNL. EGD 10/28/19: Esophagus appeared normal, empiric dilation due to history of dysphagia.  6 mm nodule seen in the gastric cardia.  Mild portal hypertensive gastropathy.  Nodule from the stomach biopsied and showed mild chronic gastritis, no H. pylori.      SLP Plan  All goals met      Recommendations for follow up therapy are one  component of a multi-disciplinary discharge planning process, led by the attending physician.  Recommendations may be updated based on patient status, additional functional criteria and insurance authorization.    Recommendations  Diet recommendations: Regular;Thin liquid Liquids provided via: Cup;Straw Medication Administration: Crushed with puree Supervision: Patient able to self feed;Intermittent supervision to cue for compensatory strategies Compensations: Slow rate;Small sips/bites Postural Changes and/or Swallow Maneuvers: Seated upright 90 degrees;Upright 30-60 min after meal                Oral Care Recommendations: Oral care BID Follow Up Recommendations: No SLP follow up SLP Visit Diagnosis: Dysphagia, unspecified (R13.10) Plan: All goals met           Osie Bond., M.A. [place] Office [phone]  Secure chat preferred   05/09/2022, 9:31 AM

## 2022-05-10 ENCOUNTER — Inpatient Hospital Stay (HOSPITAL_COMMUNITY): Payer: Medicare Other

## 2022-05-10 DIAGNOSIS — N179 Acute kidney failure, unspecified: Secondary | ICD-10-CM

## 2022-05-10 DIAGNOSIS — J810 Acute pulmonary edema: Secondary | ICD-10-CM

## 2022-05-10 DIAGNOSIS — R0600 Dyspnea, unspecified: Secondary | ICD-10-CM | POA: Diagnosis not present

## 2022-05-10 DIAGNOSIS — I10 Essential (primary) hypertension: Secondary | ICD-10-CM | POA: Diagnosis not present

## 2022-05-10 DIAGNOSIS — I5033 Acute on chronic diastolic (congestive) heart failure: Secondary | ICD-10-CM | POA: Diagnosis not present

## 2022-05-10 DIAGNOSIS — J9601 Acute respiratory failure with hypoxia: Secondary | ICD-10-CM | POA: Diagnosis not present

## 2022-05-10 DIAGNOSIS — I480 Paroxysmal atrial fibrillation: Secondary | ICD-10-CM | POA: Diagnosis not present

## 2022-05-10 LAB — BASIC METABOLIC PANEL
Anion gap: 11 (ref 5–15)
BUN: 36 mg/dL — ABNORMAL HIGH (ref 8–23)
CO2: 30 mmol/L (ref 22–32)
Calcium: 10.3 mg/dL (ref 8.9–10.3)
Chloride: 93 mmol/L — ABNORMAL LOW (ref 98–111)
Creatinine, Ser: 1.72 mg/dL — ABNORMAL HIGH (ref 0.44–1.00)
GFR, Estimated: 32 mL/min — ABNORMAL LOW (ref >60)
Glucose, Bld: 92 mg/dL (ref 70–99)
Potassium: 4.4 mmol/L (ref 3.5–5.1)
Sodium: 134 mmol/L — ABNORMAL LOW (ref 135–145)

## 2022-05-10 LAB — CBC
HCT: 38.2 % (ref 36.0–46.0)
Hemoglobin: 12.8 g/dL (ref 12.0–15.0)
MCH: 32.3 pg (ref 26.0–34.0)
MCHC: 33.5 g/dL (ref 30.0–36.0)
MCV: 96.5 fL (ref 80.0–100.0)
Platelets: 177 10*3/uL (ref 150–400)
RBC: 3.96 MIL/uL (ref 3.87–5.11)
RDW: 16.8 % — ABNORMAL HIGH (ref 11.5–15.5)
WBC: 12 10*3/uL — ABNORMAL HIGH (ref 4.0–10.5)
nRBC: 0 % (ref 0.0–0.2)

## 2022-05-10 LAB — GLUCOSE, CAPILLARY
Glucose-Capillary: 196 mg/dL — ABNORMAL HIGH (ref 70–99)
Glucose-Capillary: 91 mg/dL (ref 70–99)
Glucose-Capillary: 129 mg/dL — ABNORMAL HIGH (ref 70–99)
Glucose-Capillary: 85 mg/dL (ref 70–99)

## 2022-05-10 MED ORDER — NITROGLYCERIN 0.4 MG SL SUBL
SUBLINGUAL_TABLET | SUBLINGUAL | Status: AC
  Filled 2022-05-10: qty 1

## 2022-05-10 NOTE — Progress Notes (Signed)
PT awake and alert most of the day. Sat at bedside to eat her breakfast. Given morning pills and Zofran for nausea. PT took all pills whole without issue with larger pills broken in half. Prefers milk with administration. While swallowing her last pill, a capsule, PT began to vomit. Vomited one pill and milk. Also swallowed a bolus that looked like a sausage the size of this nurse's thumb.  Doctor and Palliative care RN made aware of incident.   PT did not dinner, stated if she tried she would throw up. Tolerating liquids well.  PT was oob to chair and visited with family this afternoon, returned to bed and slept before her meal.   Using call bell appropriately.

## 2022-05-10 NOTE — Progress Notes (Signed)
Progress Note   Patient: Yolanda [name] OJJ:[id number] DOB: [birth year] DOA: 04/28/2022     12 DOS: the patient was seen and examined on 05/10/2022   Brief hospital course: Yolanda [name] was admitted to the hospital with the working diagnosis of decompensated heart failure.   66 yo female with the past medical history of NASH/ cirrhosis, paroxysmal atrial fibrillation, heart failure, COPD and obesity who presented with dyspnea. She was discharged from the hospital ([facility]) one day prior, for heart failure, pneumonia and urinary tract infection, she was in the hospital for 8 days. Patient at home was noted to have worsening oxygen desaturation down to the 60's and increased work of breathing that prompted her transfer to the ED. At the time she reached the ED she was in respiratory distress and was placed on non invasive mechanical ventilation. Her blood pressure was 118/52, HR 82, RR 31 and 02 saturation 96%, lungs with decreased breath sounds, diffuse wheezing and rales more right than left, increased work of breathing, heart with S1 and S2 present and rhythmic, abdomen with no distention, no lower extremity edema, patient was lethargic  but non focal.   ABG 7.34/ 42/ 73/ 24/ 93% Na 132, K 4,0 CL 104, bicarbonate 22, glucose 98, bun 16, cr 0.72  AST 42 ALT 18 BNP 103 Wbc 11.8 hgb 9.8 plt 222  Chest radiograph with cardiomegaly, bilateral hilar vascular congestion, bilateral interstitial infiltrates with more dense at the right lower lobe.   EKG 86 bpm, normal axis, normal qtc, atrial fibrillation rhythm, no significant ST segment or T wave changes.   Patient continue with high oxygen requirements and using Bipap intermittently.   10/22 worsening respiratory failure and required intubation.  Placed on broad spectrum antibiotic therapy with Zosyn.  10/25 liberated from invasive mechanical ventilation, patient with encephalopathy.  10/26 atrial fibrillation with RVR, required amiodarone  infusion.  10/28 transfer [facility] to [facility]  10/29 continue with uncontrolled atrial fibrillation.   Assessment and Plan: * Acute on chronic diastolic CHF (congestive heart failure) (HCC) Echocardiogram with preserved LV systolic function EF 70 to 93%, RV systolic function preserved, no significant valvular disease.   CT chest with bilateral ground glass opacities, predominant central location. Small bilateral pleural effusions, more right than left. New small pericardial effusion.   Patient is 14,555 negative fluid balance Her urine output for the last 24 hrs was 716 cc Systolic blood pressure 967 to 127 mmHg.  Her symptoms have improved.    Continue diuresis with torsemide, spironolactone, and B blocker with metoprolol.  If blood pressure stable will add low dose ARB   Acute hypoxemic respiratory failure due to cardiogenic pulmonary edema.  Patient has completed antibiotic therapy for recurrent pneumonia.   Dysphagia, continue with aspiration precautions, patient was not able to complete esophagogram due to nausea last week.   Essential hypertension Patient with metoprolol and diuretic therapy with furosemide and spironolactone.  If blood pressure stable may add ARB. Midodrine has been discontinued.   Paroxysmal atrial fibrillation with RVR (HCC) Atrial flutter.  Patient is on metoprolol for rate control but not on anticoagulation.  Continue with IV amiodarone load, transition to oral per protocol.   Patient has been placed on apixaban for anticoagulation.    AKI (acute kidney injury) ([place]) Hyponatremia, hypokalemia, hypomagnesemia.  Volume status has been improving, renal function today with serum cr at 1,72 with K at 4,4 and serum bicarbonate at 30 with Na 134.  Plan to continue diuresis and follow up renal  function in am.  Check electrolytes including Mg.   Liver cirrhosis secondary to NASH [facility]) Patient with no clinical signs of ascites, no signs of decompensated liver  failure.  Elevated liver enzymes due to congested hepatopathy.   Continue with rifaximin and lactulose.   Acute metabolic encephalopathy Her encephalopathy has improved. Continue with Pt and OT.   GERD (gastroesophageal reflux disease) Continue antiacid therapy.   Hypothyroidism Continue with levothyroxine   Type 2 diabetes mellitus with hyperlipidemia (HCC) Continue insulin sliding scale for glucose cover and monitoring Fasting glucose this am 92 mg/dl  Continue with statin therapy.   Class 3 obesity (HCC) Calculated BMI is 44.3 Patient with very poor physical functional status, uses a walker for ambulation.   Depression and anxiety,  Continue with daily duloxetine.  Discontinue alprazolam.         Subjective: Patient with improvement in her dyspnea but not yet back to baseline, no nausea or vomiting today   Physical Exam: Vitals:   05/09/22 2353 05/10/22 0513 05/10/22 0817 05/10/22 0822  BP: (!) 103/59 116/63  122/76  Pulse: (!) 58 61  67  Resp: 16   18  Temp: 97.9 F (36.6 C) 98 F (36.7 C)    TempSrc: Oral Oral    SpO2: 100% 100% 98% 100%  Weight:  106.3 kg    Height:       Neurology awake and alert ENT with mild pallor Cardiovascular with S1 and S2 present with no gallops, or rubs Respiratory with scattered rales with no wheezing or rhonchi Abdomen with no distention No  distal lower extremity edema  Data Reviewed:    Family Communication: no family at the bedside   Disposition: Status is: Inpatient Remains inpatient appropriate because: pending renal function to be more stable.   Planned Discharge Destination: Rehab      Author: Tawni Millers, MD 05/10/2022 10:44 AM  For on call review [email or web address].

## 2022-05-10 NOTE — Progress Notes (Addendum)
Physical Therapy Treatment Patient Details Name: Yolanda [name] MRN: [id number] DOB: Dec 23, 1955 Today's Date: 05/10/2022   History of Present Illness Pt is 66 y/o F admitted to [facility] on 04/28/22 wih SOB, hypoxia, acute on chronic heart failure, Afib with RVR. Worsening hypoxic respiratory failure 10/22 with transfer to ICU; ETT 10/22-10/25. Of note, recent hosptial admission 10/11-10/19 with acute encephalopathy. PMH of anxiety, depression, fibroylagia, GERD, HTN, hypothyroidism, obesity, RA, PE, liver cirrhosis.    PT Comments    Pt progressing slowly towards her physical therapy goals. Per chart review, pt fell with nursing staff last night transferring to [facility]; pt reports there were "too many people in the room." Pt requiring minimal assist for step pivot transfers. Limited in further distance due to fear of falling. SpO2 93% on 2L O2. Pt continues to demonstrate decreased cardiopulmonary endurance, weakness, impaired standing balance, cognitive deficits. Suspect steady progress given age, PLOF, and motivation. Continue to recommend acute inpatient rehab (AIR) for post-acute therapy needs.    Recommendations for follow up therapy are one component of a multi-disciplinary discharge planning process, led by the attending physician.  Recommendations may be updated based on patient status, additional functional criteria and insurance authorization.  Follow Up Recommendations  Acute inpatient rehab (3hours/day) Can patient physically be transported by private vehicle: Yes   Assistance Recommended at Discharge Frequent or constant Supervision/Assistance  Patient can return home with the following Help with stairs or ramp for entrance;Assistance with cooking/housework;A lot of help with bathing/dressing/bathroom;Assist for transportation;A little help with walking and/or transfers   Equipment Recommendations  BSC/3in1;Wheelchair (measurements PT);Wheelchair cushion (measurements PT)     Recommendations for Other Services       Precautions / Restrictions Precautions Precautions: Fall Restrictions Weight Bearing Restrictions: No     Mobility  Bed Mobility Overal bed mobility: Needs Assistance Bed Mobility: Supine to Sit     Supine to sit: Min guard     General bed mobility comments: Cues for reaching for bed rail    Transfers Overall transfer level: Needs assistance Equipment used: Rolling walker (2 wheels) Transfers: Sit to/from Stand, Bed to chair/wheelchair/BSC Sit to Stand: Min assist   Step pivot transfers: Min assist       General transfer comment: Step pivot to [facility] and then recliner with minA for balance. Consistent cues for walker proximity and sequencing/direction. Pt limited in walking further due to feeling like she "was falling," and requesting to sit. No knee buckle noted    Ambulation/Gait                   Stairs             Wheelchair Mobility    Modified Rankin (Stroke Patients Only)       Balance Overall balance assessment: Needs assistance Sitting-balance support: Feet unsupported, No upper extremity supported Sitting balance-Leahy Scale: Fair     Standing balance support: During functional activity, Bilateral upper extremity supported, Reliant on assistive device for balance Standing balance-Leahy Scale: Poor                              Cognition Arousal/Alertness: Awake/alert Behavior During Therapy: WFL for tasks assessed/performed Overall Cognitive Status: Impaired/Different from baseline Area of Impairment: Attention, Memory, Following commands, Safety/judgement, Awareness, Problem solving                   Current Attention Level: Sustained Memory: Decreased short-term memory Following Commands: Follows  one step commands with increased time, Follows one step commands inconsistently Safety/Judgement: Decreased awareness of deficits, Decreased awareness of safety Awareness:  Emergent Problem Solving: Slow processing, Decreased initiation General Comments: Significantly increased time for processing and following one step commands. Cues for problem solving        Exercises      General Comments        Pertinent Vitals/Pain Pain Assessment Pain Assessment: Faces Faces Pain Scale: No hurt    Home Living                          Prior Function            PT Goals (current goals can now be found in the care plan section) Acute Rehab PT Goals Patient Stated Goal: to feel better Potential to Achieve Goals: Good Progress towards PT goals: Progressing toward goals    Frequency    Min 3X/week      PT Plan Current plan remains appropriate    Co-evaluation              AM-PAC PT "6 Clicks" Mobility   Outcome Measure  Help needed turning from your back to your side while in a flat bed without using bedrails?: A Little Help needed moving from lying on your back to sitting on the side of a flat bed without using bedrails?: A Little Help needed moving to and from a bed to a chair (including a wheelchair)?: A Little Help needed standing up from a chair using your arms (e.g., wheelchair or bedside chair)?: A Little Help needed to walk in hospital room?: A Lot Help needed climbing 3-5 steps with a railing? : Total 6 Click Score: 15    End of Session Equipment Utilized During Treatment: Gait belt;Oxygen Activity Tolerance: Patient tolerated treatment well Patient left: in chair;with call bell/phone within reach;with chair alarm set Nurse Communication: Mobility status PT Visit Diagnosis: Muscle weakness (generalized) (M62.81);Other abnormalities of gait and mobility (R26.89)     Time: 8676-7209 PT Time Calculation (min) (ACUTE ONLY): 40 min  Charges:  $Therapeutic Activity: 38-52 mins                     [name], PT, DPT Acute Rehabilitation Services Office [phone]    [name] 05/10/2022, 1:51  PM

## 2022-05-10 NOTE — Plan of Care (Signed)
  Problem: Education: Goal: Ability to describe self-care measures that may prevent or decrease complications (Diabetes Survival Skills Education) will improve Outcome: Progressing Goal: Individualized Educational Video(s) Outcome: Progressing   Problem: Coping: Goal: Ability to adjust to condition or change in health will improve Outcome: Progressing   Problem: Fluid Volume: Goal: Ability to maintain a balanced intake and output will improve Outcome: Progressing   Problem: Health Behavior/Discharge Planning: Goal: Ability to identify and utilize available resources and services will improve Outcome: Progressing Goal: Ability to manage health-related needs will improve Outcome: Progressing   Problem: Metabolic: Goal: Ability to maintain appropriate glucose levels will improve Outcome: Progressing   Problem: Nutritional: Goal: Maintenance of adequate nutrition will improve Outcome: Progressing Goal: Progress toward achieving an optimal weight will improve Outcome: Progressing   Problem: Skin Integrity: Goal: Risk for impaired skin integrity will decrease Outcome: Progressing   Problem: Tissue Perfusion: Goal: Adequacy of tissue perfusion will improve Outcome: Progressing   Problem: Education: Goal: Knowledge of General Education information will improve Description: Including pain rating scale, medication(s)/side effects and non-pharmacologic comfort measures Outcome: Progressing   Problem: Health Behavior/Discharge Planning: Goal: Ability to manage health-related needs will improve Outcome: Progressing   Problem: Clinical Measurements: Goal: Ability to maintain clinical measurements within normal limits will improve Outcome: Progressing Goal: Will remain free from infection Outcome: Progressing Goal: Diagnostic test results will improve Outcome: Progressing Goal: Respiratory complications will improve Outcome: Progressing Goal: Cardiovascular complication will  be avoided Outcome: Progressing   Problem: Activity: Goal: Risk for activity intolerance will decrease Outcome: Progressing   Problem: Nutrition: Goal: Adequate nutrition will be maintained Outcome: Progressing   Problem: Coping: Goal: Level of anxiety will decrease Outcome: Progressing   Problem: Elimination: Goal: Will not experience complications related to bowel motility Outcome: Progressing Goal: Will not experience complications related to urinary retention Outcome: Progressing   Problem: Pain Managment: Goal: General experience of comfort will improve Outcome: Progressing   Problem: Safety: Goal: Ability to remain free from injury will improve Outcome: Progressing   Problem: Skin Integrity: Goal: Risk for impaired skin integrity will decrease Outcome: Progressing   Problem: Education: Goal: Ability to demonstrate management of disease process will improve Outcome: Progressing Goal: Individualized Educational Video(s) Outcome: Progressing   Problem: Activity: Goal: Capacity to carry out activities will improve Outcome: Progressing   Problem: Cardiac: Goal: Ability to achieve and maintain adequate cardiopulmonary perfusion will improve Outcome: Progressing   Problem: Safety: Goal: Non-violent Restraint(s) Outcome: Progressing

## 2022-05-10 NOTE — Progress Notes (Signed)
                                                     Palliative Care Progress Note, Assessment & Plan   Patient Name: Yolanda [name]       Date: 05/10/2022 DOB: Jan 25, 1956  Age: 66 y.o. MRN#: [id number] Attending Physician: Tawni Millers Primary Care Physician: Celene Squibb, MD Admit Date: 04/28/2022  Reason for Consultation/Follow-up: Establishing goals of care  Summary of counseling/coordination of care: After reviewing the patient's chart and assessing the patient at bedside, I spoke with patient in regards to symptom management.    We discussed interventions of more frequent use of Zofran as well as addition of Magic mouthwash to minimize her nausea or sore mouth.  Patient endorses Zofran did not alleviate any nausea. Zofran will remain available PRN.   Patient says magic mouthwash gave her a dry mouth. Oral moisturizer given to help with dry mouth and magic mouthwash discontinued.  Pt states nausea is present only when she is eating. RN endorsed pt vomioted large bolus of food that was without gastric contents just after taking AM pills. RN anotified attending and ongoing discussion of esophageal motility continues. Education provided to patient regarding esophagram and plan to perform when medically stable.   PMT will continue to follow the patient throughout her hospitalization.   Physical Exam Vitals reviewed.  Constitutional:      General: She is not in acute distress.    Appearance: She is obese. She is not toxic-appearing.  HENT:     Head: Normocephalic.  Eyes:     Pupils: Pupils are equal, round, and reactive to light.  Cardiovascular:     Pulses: Normal pulses.  Pulmonary:     Effort: Pulmonary effort is normal.  Abdominal:     Palpations: Abdomen is soft.  Skin:    General: Skin is warm and dry.     Coloration:  Skin is jaundiced.  Neurological:     Mental Status: She is alert and oriented to person, place, and time. Mental status is at baseline.  Psychiatric:        Mood and Affect: Mood normal.        Behavior: Behavior normal.        Thought Content: Thought content normal.        Judgment: Judgment normal.             Palliative Assessment/Data: 50%    Total Time 35 minutes  Greater than 50%  of this time was spent counseling and coordinating care related to the above assessment and plan.  Thank you for allowing the Palliative Medicine Team to assist in the care of this patient.  [place] Ilsa Iha, FNP-BC Palliative Medicine Team Team Phone # [phone]

## 2022-05-10 NOTE — Progress Notes (Signed)
   05/10/22 1500  Mobility  Activity Ambulated with assistance in room;Transferred from bed to chair  Level of Assistance Contact guard assist, steadying assist  Assistive Device Front wheel walker  Distance Ambulated (ft) 8 ft  Activity Response Tolerated well  Mobility Referral Yes  $Mobility charge 1 Mobility   Mobility Specialist Progress Note  Pt was in chair wanting to get back in bed. Denied further mobility d/t "being worn out". Returned to bed w/ all needs met and call bell in reach.   Lucious Groves Mobility Specialist

## 2022-05-10 NOTE — TOC Progression Note (Addendum)
Transition of Care [facility]) - Progression Note    Patient Details  Name: Yolanda [name] MRN: [id number] Date of Birth: 08-25-1955  Transition of Care [facility]) CM/SW Contact  Zenon Mayo, RN Phone Number: 05/10/2022, 7:16 PM  Clinical Narrative:    Conts on amio drip, she had issues with swallowing this am per Staff RN, she has chronic nausea, for esophageal scan today, she is active with [facility] for [place], [place], [place], and aide.  If she is started on HF meds will need med application asst. TOC following. CIR following also.   Expected Discharge Plan: [place] Barriers to Discharge: Continued Medical Work up  Expected Discharge Plan and Services Expected Discharge Plan: [place] In-house Referral: Clinical Social Work Discharge Planning Services: CM Consult Post Acute Care Choice: Home Health, Resumption of Svcs/PTA Provider Living arrangements for the past 2 months: Single Family Home                   DME Agency: NA       HH Arranged: RN, Disease Management, Nurse's Aide, Social Work [employer] Agency: Microbiologist ([place]) Date [place]: 05/04/22 Time [place]: 1626 Representative spoke with at [place]: Guadalupe Guerra ([place]) Interventions    Readmission Risk Interventions    05/04/2022    4:24 PM 04/25/2022    1:27 PM 09/22/2021   11:13 AM  Readmission Risk Prevention Plan  Post Dischage Appt   Complete  Medication Screening   Complete  Transportation Screening Complete Complete Complete  HRI or [place]  Complete   Social Work Consult for [place] Planning/Counseling  Complete   Palliative Care Screening  Not Applicable   Medication Review Press photographer) Complete Complete   HRI or [place] Complete    SW Recovery Care/Counseling Consult Complete    [place] Not Applicable

## 2022-05-11 DIAGNOSIS — I5033 Acute on chronic diastolic (congestive) heart failure: Secondary | ICD-10-CM | POA: Diagnosis not present

## 2022-05-11 DIAGNOSIS — N179 Acute kidney failure, unspecified: Secondary | ICD-10-CM | POA: Diagnosis not present

## 2022-05-11 DIAGNOSIS — I10 Essential (primary) hypertension: Secondary | ICD-10-CM | POA: Diagnosis not present

## 2022-05-11 DIAGNOSIS — I480 Paroxysmal atrial fibrillation: Secondary | ICD-10-CM | POA: Diagnosis not present

## 2022-05-11 LAB — GLUCOSE, CAPILLARY
Glucose-Capillary: 85 mg/dL (ref 70–99)
Glucose-Capillary: 112 mg/dL — ABNORMAL HIGH (ref 70–99)
Glucose-Capillary: 106 mg/dL — ABNORMAL HIGH (ref 70–99)
Glucose-Capillary: 85 mg/dL (ref 70–99)

## 2022-05-11 LAB — BASIC METABOLIC PANEL
Anion gap: 9 (ref 5–15)
BUN: 39 mg/dL — ABNORMAL HIGH (ref 8–23)
CO2: 32 mmol/L (ref 22–32)
Calcium: 11 mg/dL — ABNORMAL HIGH (ref 8.9–10.3)
Chloride: 93 mmol/L — ABNORMAL LOW (ref 98–111)
Creatinine, Ser: 1.94 mg/dL — ABNORMAL HIGH (ref 0.44–1.00)
GFR, Estimated: 28 mL/min — ABNORMAL LOW (ref >60)
Glucose, Bld: 100 mg/dL — ABNORMAL HIGH (ref 70–99)
Potassium: 5.2 mmol/L — ABNORMAL HIGH (ref 3.5–5.1)
Sodium: 134 mmol/L — ABNORMAL LOW (ref 135–145)

## 2022-05-11 LAB — MAGNESIUM: Magnesium: 2.2 mg/dL (ref 1.7–2.4)

## 2022-05-11 MED ORDER — AMIODARONE HCL 200 MG PO TABS
200.0000 mg | ORAL_TABLET | Freq: Every day | ORAL | Status: DC
  Administered 2022-05-12 – 2022-05-14 (×3): 200 mg via ORAL
  Filled 2022-05-11 (×3): qty 1

## 2022-05-11 MED ORDER — HYDROXYZINE HCL 10 MG PO TABS
5.0000 mg | ORAL_TABLET | Freq: Three times a day (TID) | ORAL | Status: DC | PRN
  Administered 2022-05-11 – 2022-05-14 (×4): 5 mg via ORAL
  Filled 2022-05-11 (×4): qty 1

## 2022-05-11 NOTE — Progress Notes (Signed)
Occupational Therapy Treatment Patient Details Name: Yolanda [name] MRN: [id number] DOB: 1955-07-19 Today's Date: 05/11/2022   History of present illness Pt is 66 y/o F admitted to [facility] on 04/28/22 wih SOB, hypoxia, acute on chronic heart failure, Afib with RVR. Worsening hypoxic respiratory failure 10/22 with transfer to ICU; ETT 10/22-10/25. Of note, recent hosptial admission 10/11-10/19 with acute encephalopathy. PMH of anxiety, depression, fibroylagia, GERD, HTN, hypothyroidism, obesity, RA, PE, liver cirrhosis.   OT comments  Pt anxious she will fall, but only requiring light min to min guard assist for OOB with RW. Completed toileting with min assist with RW and seated grooming with set up to min assist. Pt requiring increased time to follow commands, cues for problem solving and demonstrating impaired memory. Some off-topic conversation. Pt with SpO2 of 92% on 2L O2. Continue to recommend AIR.    Recommendations for follow up therapy are one component of a multi-disciplinary discharge planning process, led by the attending physician.  Recommendations may be updated based on patient status, additional functional criteria and insurance authorization.    Follow Up Recommendations  Acute inpatient rehab (3hours/day)    Assistance Recommended at Discharge Frequent or constant Supervision/Assistance  Patient can return home with the following  A little help with walking and/or transfers;A lot of help with bathing/dressing/bathroom;Assistance with cooking/housework;Direct supervision/assist for financial management;Assist for transportation;Help with stairs or ramp for entrance   Equipment Recommendations  None recommended by OT    Recommendations for Other Services      Precautions / Restrictions Precautions Precautions: Fall Restrictions Weight Bearing Restrictions: No       Mobility Bed Mobility Overal bed mobility: Needs Assistance Bed Mobility: Supine to Sit     Supine  to sit: Min guard     General bed mobility comments: increased time and effort    Transfers Overall transfer level: Needs assistance Equipment used: Rolling walker (2 wheels) Transfers: Sit to/from Stand Sit to Stand: Min assist           General transfer comment: cues for hand placement, increased time, light min assist to stand, pt anxious she will fall     Balance Overall balance assessment: Needs assistance   Sitting balance-Leahy Scale: Fair     Standing balance support: During functional activity, Bilateral upper extremity supported, Reliant on assistive device for balance                               ADL either performed or assessed with clinical judgement   ADL Overall ADL's : Needs assistance/impaired Eating/Feeding: Set up;Sitting   Grooming: Brushing hair;Oral care;Wash/dry hands;Wash/dry face;Sitting;Minimal assistance Grooming Details (indicate cue type and reason): pt attempting to clean mouth with soapy washcloth handed to her for her face and hands                 Toilet Transfer: Min guard;Ambulation;Rolling walker (2 wheels);BSC/3in1   Toileting- Clothing Manipulation and Hygiene: Minimal assistance       Functional mobility during ADLs: Min guard;Rolling walker (2 wheels) General ADL Comments: Completed grooming seated in chair.    Extremity/Trunk Assessment Upper Extremity Assessment Upper Extremity Assessment: Generalized weakness            Vision       Perception     Praxis      Cognition Arousal/Alertness: Awake/alert Behavior During Therapy: WFL for tasks assessed/performed Overall Cognitive Status: Impaired/Different from baseline Area of Impairment: Memory, Following  commands, Safety/judgement, Problem solving                     Memory: Decreased short-term memory Following Commands: Follows one step commands with increased time, Follows one step commands inconsistently     Problem Solving:  Slow processing, Decreased initiation General Comments: Pt fearful of falling, required two attempts to stand and ambulate to chair.        Exercises      Shoulder Instructions       General Comments      Pertinent Vitals/ Pain       Pain Assessment Pain Assessment: No/denies pain  Home Living                                          Prior Functioning/Environment              Frequency  Min 2X/week        Progress Toward Goals  OT Goals(current goals can now be found in the care plan section)  Progress towards OT goals: Progressing toward goals  Acute Rehab OT Goals OT Goal Formulation: With patient Time For Goal Achievement: 05/19/22 Potential to Achieve Goals: Good  Plan Discharge plan remains appropriate    Co-evaluation                 AM-PAC OT "6 Clicks" Daily Activity     Outcome Measure   Help from another person eating meals?: None Help from another person taking care of personal grooming?: A Little Help from another person toileting, which includes using toliet, bedpan, or urinal?: A Little Help from another person bathing (including washing, rinsing, drying)?: A Lot Help from another person to put on and taking off regular upper body clothing?: A Little Help from another person to put on and taking off regular lower body clothing?: A Lot 6 Click Score: 17    End of Session Equipment Utilized During Treatment: Oxygen;Rolling walker (2 wheels);Gait belt  OT Visit Diagnosis: Muscle weakness (generalized) (M62.81);Other symptoms and signs involving cognitive function   Activity Tolerance Patient tolerated treatment well   Patient Left in chair;with call bell/phone within reach;with chair alarm set   Nurse Communication          Time: 1010-1043 OT Time Calculation (min): 33 min  Charges: OT General Charges $OT Visit: 1 Visit OT Treatments $Self Care/Home Management : 23-37 mins  Cleta Alberts, OTR/L Acute  Rehabilitation Services Office: [phone]   Malka So 05/11/2022, 11:19 AM

## 2022-05-11 NOTE — Progress Notes (Signed)
   05/11/22 1059  Mobility  Activity Transferred from bed to chair  Level of Assistance Contact guard assist, steadying assist  Assistive Device Front wheel walker  Distance Ambulated (ft) 5 ft  Activity Response Tolerated well  Mobility Referral Yes  $Mobility charge 1 Mobility   Mobility Specialist Progress Note  Pt was in bed and agreeable. Max encouragment d/t being scared of falling again. No c/o pain throughout transfer. Left in chair w/ all needs met, call bell in reach, and alarm on.   Lucious Groves Mobility Specialist

## 2022-05-11 NOTE — Progress Notes (Signed)
This chaplain is present for F/U spiritual care in the space of creating the Pt. HCPOA.  The Pt. is awake and sitting in the bedside recliner. The Pt. shares she continues to experience nausea and chose not to eat breakfast today.    The Pt. communicated her decision not to complete HCPOA in the hospital. The chaplain understands the Pt. will continue to have both daughters: [name] and [place]  as surrogate decision makers. The chaplain listened reflectively to the Pt. decision, timing,and her process of talking with her daughters on the phone.   In the process of continuing HCPOA education with the Pt., the chaplain noticed the Pt. reference to things moving in the room. The chaplain asked the Pt. to describe the things she is seeing in the room. The Pt. described another person in the room between the bed and chair. Along with rabbits and puppies running across the table. The Pt. did not show any visible signs of distress by the additions in the room during this visit.  The chaplain accepted the Pt. invitation to visit again. The chaplain updated the Pt. RN-Dorothy.    Chaplain Sallyanne Kuster [phone]

## 2022-05-11 NOTE — Progress Notes (Signed)
Progress Note   Patient: Yolanda [name] TWS:[id number] DOB: [birth year] DOA: 04/28/2022     13 DOS: the patient was seen and examined on 05/11/2022   Brief hospital course: Yolanda [name] was admitted to the hospital with the working diagnosis of decompensated heart failure.   66 yo female with the past medical history of NASH/ cirrhosis, paroxysmal atrial fibrillation, heart failure, COPD and obesity who presented with dyspnea. She was discharged from the hospital (AP) one day prior, for heart failure, pneumonia and urinary tract infection, she was in the hospital for 8 days. Patient at home was noted to have worsening oxygen desaturation down to the 60's and increased work of breathing that prompted her transfer to the ED. At the time she reached the ED she was in respiratory distress and was placed on non invasive mechanical ventilation. Her blood pressure was 118/52, HR 82, RR 31 and 02 saturation 96%, lungs with decreased breath sounds, diffuse wheezing and rales more right than left, increased work of breathing, heart with S1 and S2 present and rhythmic, abdomen with no distention, no lower extremity edema, patient was lethargic  but non focal.   ABG 7.34/ 42/ 73/ 24/ 93% Na 132, K 4,0 CL 104, bicarbonate 22, glucose 98, bun 16, cr 0.72  AST 42 ALT 18 BNP 103 Wbc 11.8 hgb 9.8 plt 222  Chest radiograph with cardiomegaly, bilateral hilar vascular congestion, bilateral interstitial infiltrates with more dense at the right lower lobe.   EKG 86 bpm, normal axis, normal qtc, atrial fibrillation rhythm, no significant ST segment or T wave changes.   Patient continue with high oxygen requirements and using Bipap intermittently.   10/22 worsening respiratory failure and required intubation.  Placed on broad spectrum antibiotic therapy with Zosyn.  10/25 liberated from invasive mechanical ventilation, patient with encephalopathy.  10/26 atrial fibrillation with RVR, required amiodarone  infusion.  10/28 [facility] to [facility].  10/29 continue with uncontrolled atrial fibrillation.  11/02 patient converted to sinus rhythm.   Assessment and Plan: * Acute on chronic diastolic CHF (congestive heart failure) (HCC) Echocardiogram with preserved LV systolic function EF 70 to 00%, RV systolic function preserved, no significant valvular disease.   CT chest with bilateral ground glass opacities, predominant central location. Small bilateral pleural effusions, more right than left. New small pericardial effusion.   Urine output for the last 24 hrs was 1749 cc Systolic blood pressure 449 to 112 mmHg.  Her symptoms have improved.   Continue with metoprolol, hold on diuretic therapy for now.   Acute hypoxemic respiratory failure due to cardiogenic pulmonary edema.  Patient has completed antibiotic therapy for recurrent pneumonia.   Dysphagia, continue with aspiration precautions, patient was not able to complete esophagogram due to nausea last week.  Follow up cell count in am.   Essential hypertension Patient with metoprolol Hold on diuretics due to reduced GFR   Paroxysmal atrial fibrillation with RVR (HCC) Atrial flutter.  Patient is on metoprolol for rate control.  Patient sinus rhythm on telemetry, will transition to oral amiodarone   Patient has been placed on apixaban for anticoagulation.    AKI (acute kidney injury) ([place]) Hyponatremia, hypokalemia, hypomagnesemia.  Renal function with serum cr at 1,94 with K at 5.3 and serum bicarbonate at 32. Na 134, Mg 2.2 Plan to hold on diuretic therapy for now and follow up renal function and electrolytes in am.  Hold on K supplementation    Liver cirrhosis secondary to NASH [facility]) Patient with no clinical  signs of ascites, no signs of decompensated liver failure.  Elevated liver enzymes due to congested hepatopathy.   Continue with rifaximin and lactulose.   Acute metabolic encephalopathy Her encephalopathy has  improved. Continue with Pt and OT.   GERD (gastroesophageal reflux disease) Continue antiacid therapy.   Hypothyroidism Continue with levothyroxine   Type 2 diabetes mellitus with hyperlipidemia (HCC) Continue insulin sliding scale for glucose cover and monitoring Fasting glucose this am 100 mg/dl  Continue with statin therapy.   Class 3 obesity (HCC) Calculated BMI is 44.3 Patient with very poor physical functional status, uses a walker for ambulation.   Depression and anxiety,  Continue with daily duloxetine.  Discontinue alprazolam.         Subjective: Patient with improvement in dyspnea and edema, no chest pain   Physical Exam: Vitals:   05/11/22 0351 05/11/22 0722 05/11/22 0903 05/11/22 1339  BP: (!) 110/58 112/62  (!) 110/58  Pulse: 64 67  63  Resp: 15 17  18   Temp: 97.6 F (36.4 C) 97.9 F (36.6 C)    TempSrc: Oral Oral    SpO2: 97% 98% 98% 100%  Weight: 106.4 kg     Height:       Neurology awake and alert ENT with mild pallor Cardiovascular with S1 and S2 present and rhythmic with no gallops, rubs or murmurs Respiratory with scattered rales and no wheezing Abdomen with no distention No lower extremity edema  Data Reviewed:    Family Communication: I spoke with patient's daughter at the bedside, we talked in detail about patient's condition, plan of care and prognosis and all questions were addressed.   Disposition: Status is: Inpatient Remains inpatient appropriate because: renal failure and respiratory failure   Planned Discharge Destination: Home/ CIR       Author: Tawni Millers, MD 05/11/2022 2:56 PM  For on call review [email or web address].

## 2022-05-12 ENCOUNTER — Ambulatory Visit: Payer: Medicare Other | Admitting: Gastroenterology

## 2022-05-12 DIAGNOSIS — I480 Paroxysmal atrial fibrillation: Secondary | ICD-10-CM | POA: Diagnosis not present

## 2022-05-12 DIAGNOSIS — J9601 Acute respiratory failure with hypoxia: Secondary | ICD-10-CM | POA: Diagnosis not present

## 2022-05-12 DIAGNOSIS — N179 Acute kidney failure, unspecified: Secondary | ICD-10-CM | POA: Diagnosis not present

## 2022-05-12 DIAGNOSIS — I5033 Acute on chronic diastolic (congestive) heart failure: Secondary | ICD-10-CM | POA: Diagnosis not present

## 2022-05-12 DIAGNOSIS — K7581 Nonalcoholic steatohepatitis (NASH): Secondary | ICD-10-CM | POA: Diagnosis not present

## 2022-05-12 DIAGNOSIS — I10 Essential (primary) hypertension: Secondary | ICD-10-CM | POA: Diagnosis not present

## 2022-05-12 LAB — BASIC METABOLIC PANEL
Anion gap: 13 (ref 5–15)
BUN: 39 mg/dL — ABNORMAL HIGH (ref 8–23)
CO2: 29 mmol/L (ref 22–32)
Calcium: 11.4 mg/dL — ABNORMAL HIGH (ref 8.9–10.3)
Chloride: 90 mmol/L — ABNORMAL LOW (ref 98–111)
Creatinine, Ser: 2.28 mg/dL — ABNORMAL HIGH (ref 0.44–1.00)
GFR, Estimated: 23 mL/min — ABNORMAL LOW (ref >60)
Glucose, Bld: 90 mg/dL (ref 70–99)
Potassium: 5 mmol/L (ref 3.5–5.1)
Sodium: 132 mmol/L — ABNORMAL LOW (ref 135–145)

## 2022-05-12 LAB — CBC
HCT: 41.2 % (ref 36.0–46.0)
Hemoglobin: 13.7 g/dL (ref 12.0–15.0)
MCH: 32 pg (ref 26.0–34.0)
MCHC: 33.3 g/dL (ref 30.0–36.0)
MCV: 96.3 fL (ref 80.0–100.0)
Platelets: 169 K/uL (ref 150–400)
RBC: 4.28 MIL/uL (ref 3.87–5.11)
RDW: 17.2 % — ABNORMAL HIGH (ref 11.5–15.5)
WBC: 12.6 K/uL — ABNORMAL HIGH (ref 4.0–10.5)
nRBC: 0 % (ref 0.0–0.2)

## 2022-05-12 LAB — GLUCOSE, CAPILLARY
Glucose-Capillary: 69 mg/dL — ABNORMAL LOW (ref 70–99)
Glucose-Capillary: 78 mg/dL (ref 70–99)
Glucose-Capillary: 76 mg/dL (ref 70–99)
Glucose-Capillary: 66 mg/dL — ABNORMAL LOW (ref 70–99)
Glucose-Capillary: 84 mg/dL (ref 70–99)

## 2022-05-12 LAB — CULTURE, BLOOD (ROUTINE X 2)
Culture: NO GROWTH
Culture: NO GROWTH
Special Requests: ADEQUATE

## 2022-05-12 NOTE — Progress Notes (Signed)
Progress Note   Patient: Yolanda [name] SVX:[id number] DOB: 21-Mar-1956 DOA: 04/28/2022     14 DOS: the patient was seen and examined on 05/12/2022   Brief hospital course: Mrs. Poznanski was admitted to the hospital with the working diagnosis of decompensated heart failure.   66 yo female with the past medical history of NASH/ cirrhosis, paroxysmal atrial fibrillation, heart failure, COPD and obesity who presented with dyspnea. She was discharged from the hospital (AP) one day prior, for heart failure, pneumonia and urinary tract infection, she was in the hospital for 8 days. Patient at home was noted to have worsening oxygen desaturation down to the 60's and increased work of breathing that prompted her transfer to the ED. At the time she reached the ED she was in respiratory distress and was placed on non invasive mechanical ventilation. Her blood pressure was 118/52, HR 82, RR 31 and 02 saturation 96%, lungs with decreased breath sounds, diffuse wheezing and rales more right than left, increased work of breathing, heart with S1 and S2 present and rhythmic, abdomen with no distention, no lower extremity edema, patient was lethargic  but non focal.   ABG 7.34/ 42/ 73/ 24/ 93% Na 132, K 4,0 CL 104, bicarbonate 22, glucose 98, bun 16, cr 0.72  AST 42 ALT 18 BNP 103 Wbc 11.8 hgb 9.8 plt 222  Chest radiograph with cardiomegaly, bilateral hilar vascular congestion, bilateral interstitial infiltrates with more dense at the right lower lobe.   EKG 86 bpm, normal axis, normal qtc, atrial fibrillation rhythm, no significant ST segment or T wave changes.   Patient continue with high oxygen requirements and using Bipap intermittently.   10/22 worsening respiratory failure and required intubation.  Placed on broad spectrum antibiotic therapy with Zosyn.  10/25 liberated from invasive mechanical ventilation, patient with encephalopathy.  10/26 atrial fibrillation with RVR, required amiodarone  infusion.  10/28 transfer back to [facility].  10/29 continue with uncontrolled atrial fibrillation.  11/02 patient converted to sinus rhythm.  11/03 clinically improving but renal function still not stable.   Assessment and Plan: * Acute on chronic diastolic CHF (congestive heart failure) (HCC) Echocardiogram with preserved LV systolic function EF 70 to 23%, RV systolic function preserved, no significant valvular disease.   10/20 CT chest with bilateral ground glass opacities, predominant central location. Small bilateral pleural effusions, more right than left. New small pericardial effusion.   Her volume status has improved.  Urine output for the last 24 hrs was 3007 cc Systolic blood pressure 622 to 108 mmHg.   Continue with metoprolol, hold on diuretic therapy for now.   Acute hypoxemic respiratory failure due to cardiogenic pulmonary edema.  Patient has completed antibiotic therapy for recurrent pneumonia, (present on admission)  Dysphagia, continue with aspiration precautions, patient was not able to complete esophagogram due to nausea.  Patient currently on a regular diet.   Cell count stable at 12,6   Essential hypertension Patient with metoprolol Hold on diuretics due to reduced GFR   Paroxysmal atrial fibrillation with RVR (HCC) Atrial flutter.  Patient is on metoprolol and amiodarone She continue on sinus rhythm.   Patient has been placed on apixaban for anticoagulation.    AKI (acute kidney injury) ([place]) Hyponatremia, hypokalemia, hypomagnesemia.  Improved volume status, renal function is not stable yet with a serum cr of 2,28 with K at 5,0 and serum bicarbonate at 29, Na 132. Mg 2,2   Plan to continue to hold on diuretic therapy and follow up renal function  in am.  Avoid hypotension and nephrotoxic medications,    Liver cirrhosis secondary to NASH [facility]) Patient with no clinical signs of ascites, no signs of decompensated liver failure.  Elevated liver enzymes  due to congested hepatopathy.   Continue with rifaximin and lactulose.   Acute metabolic encephalopathy Her encephalopathy has resolved.  Continue with Pt and OT.   GERD (gastroesophageal reflux disease) Continue antiacid therapy.   Hypothyroidism Continue with levothyroxine   Type 2 diabetes mellitus with hyperlipidemia (HCC) \hypoglycemia  Capillary glucose 85, 69, and 78 Fasting glucose is 90. Plan to discontinue insulin therapy and check capillary glucose as needed.  Continue to encourage po intake,   Continue with statin therapy.   Class 3 obesity (HCC) Calculated BMI is 44.3 Patient with very poor physical functional status, uses a walker for ambulation.   Depression and anxiety,  Continue with daily duloxetine.  Discontinue alprazolam.         Subjective: Patient with no chest pain, dyspnea has improved, today with no nausea or vomiting, poor oral intake   Physical Exam: Vitals:   05/11/22 1957 05/11/22 2134 05/12/22 0418 05/12/22 0741  BP:  119/73 108/74 103/62  Pulse:  70 64 65  Resp:   16 17  Temp:   98 F (36.7 C) 97.7 F (36.5 C)  TempSrc:   Oral Oral  SpO2: 98%  100% 100%  Weight:   105.2 kg   Height:       Neurology awake and alert ENT with mild pallor Cardiovascular with S1 and S2 present and rhythmic with no gallops or murmurs Respiratory with no rales or wheezing Abdomen with no distention  No lower extremity edema  Data Reviewed:    Family Communication: I spoke with patient's daughter at the bedside, we talked in detail about patient's condition, plan of care and prognosis and all questions were addressed.   Disposition: Status is: Inpatient Remains inpatient appropriate because: heart failure and renal failure   Planned Discharge Destination: Home    Author: Tawni Millers, MD 05/12/2022 12:25 PM  For on call review [email or web address].

## 2022-05-12 NOTE — Progress Notes (Signed)
Physical Therapy Treatment Patient Details Name: Yolanda [name] MRN: [id number] DOB: 04-08-1956 Today's Date: 05/12/2022   History of Present Illness Pt is 66 y/o F admitted to [facility] on 04/28/22 wih SOB, hypoxia, acute on chronic heart failure, Afib with RVR. Worsening hypoxic respiratory failure 10/22 with transfer to ICU; ETT 10/22-10/25. Of note, recent hosptial admission 10/11-10/19 with acute encephalopathy. PMH of anxiety, depression, fibroylagia, GERD, HTN, hypothyroidism, obesity, RA, PE, liver cirrhosis.    PT Comments    The pt was agreeable to session with focus on progressing OOB mobility. The pt continues to demo deficits in strength, power, and stability making her dependent on assist, increased time, and BUE support to complete OOB transfers and short bouts of ambulation in the room. Pt remains limited by poor endurance, but recovers well to continue after short seated rest breaks. Remains dependent on 3L O3 to maintain SpO2 > 90%. Will continue to benefit from skilled PT acutely and following d/c to maximize functional recovery and return to independence.     Recommendations for follow up therapy are one component of a multi-disciplinary discharge planning process, led by the attending physician.  Recommendations may be updated based on patient status, additional functional criteria and insurance authorization.  Follow Up Recommendations  Acute inpatient rehab (3hours/day) Can patient physically be transported by private vehicle: Yes   Assistance Recommended at Discharge Frequent or constant Supervision/Assistance  Patient can return home with the following Help with stairs or ramp for entrance;Assistance with cooking/housework;A lot of help with bathing/dressing/bathroom;Assist for transportation;A little help with walking and/or transfers   Equipment Recommendations  BSC/3in1;Wheelchair (measurements PT);Wheelchair cushion (measurements PT)    Recommendations for Other  Services       Precautions / Restrictions Precautions Precautions: Fall Precaution Comments: watch SpO2, on 3L this session Restrictions Weight Bearing Restrictions: No     Mobility  Bed Mobility Overal bed mobility: Needs Assistance Bed Mobility: Supine to Sit     Supine to sit: Min guard     General bed mobility comments: increased time and effort    Transfers Overall transfer level: Needs assistance Equipment used: Rolling walker (2 wheels), None Transfers: Sit to/from Stand, Bed to chair/wheelchair/BSC Sit to Stand: Min guard Stand pivot transfers: Min guard         General transfer comment: cues for hand placement, pt able to rise without physical assist but is dependent on BUE support and increased time. dependent on UE support with pivot    Ambulation/Gait Ambulation/Gait assistance: Min guard Gait Distance (Feet): 15 Feet Assistive device: Rolling walker (2 wheels) Gait Pattern/deviations: Step-to pattern, Decreased stride length Gait velocity: decreased Gait velocity interpretation: <1.31 ft/sec, indicative of household ambulator   General Gait Details: pt needing cues for directing around objects, but able to complete without overt LOB. SpO2 90% on 3L after 15 ft     Balance Overall balance assessment: Needs assistance Sitting-balance support: No upper extremity supported, Feet supported Sitting balance-Leahy Scale: Fair     Standing balance support: During functional activity, Bilateral upper extremity supported, Reliant on assistive device for balance Standing balance-Leahy Scale: Poor Standing balance comment: Reliant on RW while standing during transfer, excess cues to focus on safety with balance when overstimulated                            Cognition Arousal/Alertness: Awake/alert Behavior During Therapy: WFL for tasks assessed/performed Overall Cognitive Status: Impaired/Different from baseline Area of Impairment: Memory,  Following commands, Safety/judgement, Problem solving                     Memory: Decreased short-term memory Following Commands: Follows one step commands with increased time, Follows one step commands inconsistently     Problem Solving: Slow processing, Decreased initiation General Comments: pt with slowed processing and at times needeing repeated prompts to speak loudly. needs increased time and encouragement        Exercises      General Comments General comments (skin integrity, edema, etc.): VSS on 3L, SpO2 low of 90% to 100% after 1 min seated rest      Pertinent Vitals/Pain Pain Assessment Pain Assessment: No/denies pain Pain Intervention(s): Monitored during session     PT Goals (current goals can now be found in the care plan section) Acute Rehab PT Goals Patient Stated Goal: to feel better and go home PT Goal Formulation: With patient Time For Goal Achievement: 05/22/22 Potential to Achieve Goals: Good Progress towards PT goals: Progressing toward goals    Frequency    Min 3X/week      PT Plan Current plan remains appropriate       AM-PAC PT "6 Clicks" Mobility   Outcome Measure  Help needed turning from your back to your side while in a flat bed without using bedrails?: A Little Help needed moving from lying on your back to sitting on the side of a flat bed without using bedrails?: A Little Help needed moving to and from a bed to a chair (including a wheelchair)?: A Little Help needed standing up from a chair using your arms (e.g., wheelchair or bedside chair)?: A Little Help needed to walk in hospital room?: A Lot Help needed climbing 3-5 steps with a railing? : Total 6 Click Score: 15    End of Session Equipment Utilized During Treatment: Gait belt;Oxygen Activity Tolerance: Patient tolerated treatment well Patient left: in chair;with call bell/phone within reach;with chair alarm set Nurse Communication: Mobility status PT Visit  Diagnosis: Muscle weakness (generalized) (M62.81);Other abnormalities of gait and mobility (R26.89)     Time: 5797-2820 PT Time Calculation (min) (ACUTE ONLY): 27 min  Charges:  $Therapeutic Exercise: 8-22 mins $Therapeutic Activity: 8-22 mins                     West Carbo, PT, DPT   Acute Rehabilitation Department   Sandra Cockayne 05/12/2022, 9:21 AM

## 2022-05-12 NOTE — Progress Notes (Signed)
Inpatient Rehabilitation Admissions Coordinator   I continue to follow her medical workup . Not yet medically ready to pursue any type of rehab. I will follow up next week.  Danne Baxter, RN, MSN Rehab Admissions Coordinator [phone] 05/12/2022 9:58 AM

## 2022-05-12 NOTE — Progress Notes (Signed)
Primary Cardiologist:  Mallipeddi  Subjective:  Appetite not great and persistent nausea  Objective:  Vitals:   05/11/22 1957 05/11/22 2134 05/12/22 0418 05/12/22 0741  BP:  119/73 108/74 103/62  Pulse:  70 64 65  Resp:   16 17  Temp:   98 F (36.7 C) 97.7 F (36.5 C)  TempSrc:   Oral Oral  SpO2: 98%  100% 100%  Weight:   105.2 kg   Height:        Intake/Output from previous day:  Intake/Output Summary (Last 24 hours) at 05/12/2022 0847 Last data filed at 05/12/2022 0450 Gross per 24 hour  Intake 896.8 ml  Output 1350 ml  Net -453.2 ml    Physical Exam: Chronically ill COPD with oxygen HEENT: normal Neck supple with no adenopathy JVP normal no bruits no thyromegaly Lungs clear with no wheezing and good diaphragmatic motion Heart:  S1/S2 no murmur, no rub, gallop or click PMI normal Abdomen: benighn, BS positve, no tenderness, no AAA no bruit.  No HSM or HJR Distal pulses intact with no bruits Plus one edema with stasis  Neuro non-focal Skin warm and dry No muscular weakness   Lab Results: Basic Metabolic Panel: Recent Labs    05/11/22 0055 05/12/22 0052  NA 134* 132*  K 5.2* 5.0  CL 93* 90*  CO2 32 29  GLUCOSE 100* 90  BUN 39* 39*  CREATININE 1.94* 2.28*  CALCIUM 11.0* 11.4*  MG 2.2  --    Liver Function Tests: No results for input(s): "AST", "ALT", "ALKPHOS", "BILITOT", "PROT", "ALBUMIN" in the last 72 hours.  No results for input(s): "LIPASE", "AMYLASE" in the last 72 hours. CBC: Recent Labs    05/10/22 0121 05/12/22 0052  WBC 12.0* 12.6*  HGB 12.8 13.7  HCT 38.2 41.2  MCV 96.5 96.3  PLT 177 169     Imaging: No results found.  Cardiac Studies:  ECG: 10/25 SR rate 95 normal    Telemetry:  NSR 05/12/2022   Echo: 10/16  EF 70-75% normal RV trivial MR AV sclerosis   Medications:    amiodarone  200 mg Oral Daily   apixaban  5 mg Oral BID   budesonide (PULMICORT) nebulizer solution  0.5 mg Nebulization BID   Chlorhexidine  Gluconate Cloth  6 each Topical Daily   DULoxetine  60 mg Oral Daily   feeding supplement  237 mL Oral BID BM   folic acid  1 mg Oral Daily   insulin aspart  0-5 Units Subcutaneous QHS   insulin aspart  0-9 Units Subcutaneous TID WC   lactulose  10 g Oral Daily   levothyroxine  75 mcg Oral Q0600   metoprolol tartrate  25 mg Oral BID   multivitamin with minerals  1 tablet Oral Daily   pantoprazole (PROTONIX) IV  40 mg Intravenous Q12H   rifaximin  550 mg Oral BID   rosuvastatin  10 mg Oral Daily      sodium chloride Stopped (04/30/22 0410)    Assessment/Plan:   Atrial flutter:  converted with amiodarone now PO   Continue Eliquis for anticoagulation   COPD/:  chronic respiratory failure improved sats ok   Normally on 3L's oxygen at home Thyroid:  continue synthroid replacement Follow closely on amiodarone  Cirrhosis:  volume ok on lactulose and Rifaximin  Azotemia:  lasix and aldactone held Has had a lot of diuresis last 7 days BUN 39 Cr 2.28 this am Will need to get back on some diuretic  before d/c   DNR/DNI  Cardiology will sign off   Jenkins Rouge 05/12/2022, 8:47 AM

## 2022-05-12 NOTE — Progress Notes (Signed)
Palliative Care Progress Note, Assessment & Plan   Patient Name: Yolanda [name]       Date: 05/12/2022 DOB: Dec 25, 1955  Age: 66 y.o. MRN#: [id number] Attending Physician: Tawni Millers,* Primary Care Physician: Celene Squibb, MD Admit Date: 04/28/2022  Reason for Consultation/Follow-up: Establishing goals of care  HPI: Patient is a 66 year old female with PMH significant for [place] cirrhosis, diastolic CHF, obesity, OSA, HTN, anxiety/depression, GERD, RA, hypothyroidism, PE and A-fib (not on [facility]), and chronic respiratory failure (3 L nasal cannula O2 home at baseline) admitted on 04/28/2022 with SOB/hypoxia, placed on BiPAP, and chest x-ray imaging revealed multifocal bilateral infiltrates.  Patient was treated with antibiotics and diuretics.  Respiratory status declined, patient was transferred to ICU, intubated on 10/22, extubated on 10/25, and encephalopathy remained.  On 10/26 patient was started on amiodarone gtt. for A-fib with RVR.  Plan is to convert to p.o. amiodarone once nausea subsides.   PMT was consulted to discuss goals of care.  Summary of counseling/coordination of care: After reviewing the patient's chart and assessing the patient at bedside, I attempted to speak with patient about her plan of care.  She was sleeping when I entered the room.  She awakened easily, is able to acknowledge my presence, and make her wishes known.  She shared she is very tired and would like to continue resting.  She was willing to engage with me about her recent confusion.  She shares she knows she is getting some things "mixed up" but that this is happened to her before.  She is able to articulate her name, where she is, the reason for her hospitalization.  She also says she believes some of her  confusion has come from getting such poor rest at night.  She again requests that I let her sleep.  PMT will continue to follow patient throughout her hospitalization and assist with goals of care as needed.  Physical Exam Vitals reviewed.  Constitutional:      General: She is not in acute distress.    Appearance: She is not ill-appearing.  HENT:     Head: Normocephalic.     Mouth/Throat:     Mouth: Mucous membranes are moist.  Eyes:     General: Scleral icterus present.  Cardiovascular:     Rate and Rhythm: Normal rate.     Pulses: Normal pulses.  Pulmonary:     Effort: Pulmonary effort is normal.  Abdominal:     Palpations: Abdomen is soft.  Musculoskeletal:     Comments: MAETC, generalized weakness  Neurological:     Mental Status: She is oriented to person, place, and time.  Psychiatric:        Mood and Affect: Mood normal.        Behavior: Behavior normal.        Thought Content: Thought content normal.        Judgment: Judgment normal.             Palliative Assessment/Data: 50%    Total Time 25 minutes  Greater than 50%  of this time was spent counseling and coordinating care related to the above assessment and plan.  Thank you for allowing the Palliative  Medicine Team to assist in the care of this patient.  [place] Ilsa Iha, FNP-BC Palliative Medicine Team Team Phone # [phone]

## 2022-05-12 NOTE — Progress Notes (Signed)
Hypoglycemic Event  CBG: 66  Treatment: 4 oz juice/soda  Symptoms: None  Follow-up CBG: Time: 2201 CBG Result: 84  Possible Reasons for Event: Inadequate meal intake    Yolanda [name]

## 2022-05-13 DIAGNOSIS — J9601 Acute respiratory failure with hypoxia: Secondary | ICD-10-CM | POA: Diagnosis not present

## 2022-05-13 DIAGNOSIS — K7581 Nonalcoholic steatohepatitis (NASH): Secondary | ICD-10-CM | POA: Diagnosis not present

## 2022-05-13 DIAGNOSIS — I480 Paroxysmal atrial fibrillation: Secondary | ICD-10-CM | POA: Diagnosis not present

## 2022-05-13 DIAGNOSIS — I5033 Acute on chronic diastolic (congestive) heart failure: Secondary | ICD-10-CM | POA: Diagnosis not present

## 2022-05-13 DIAGNOSIS — N179 Acute kidney failure, unspecified: Secondary | ICD-10-CM | POA: Diagnosis not present

## 2022-05-13 DIAGNOSIS — I10 Essential (primary) hypertension: Secondary | ICD-10-CM | POA: Diagnosis not present

## 2022-05-13 LAB — RENAL FUNCTION PANEL
Albumin: 2.1 g/dL — ABNORMAL LOW (ref 3.5–5.0)
Anion gap: 14 (ref 5–15)
BUN: 48 mg/dL — ABNORMAL HIGH (ref 8–23)
CO2: 27 mmol/L (ref 22–32)
Calcium: 11.7 mg/dL — ABNORMAL HIGH (ref 8.9–10.3)
Chloride: 93 mmol/L — ABNORMAL LOW (ref 98–111)
Creatinine, Ser: 2.94 mg/dL — ABNORMAL HIGH (ref 0.44–1.00)
GFR, Estimated: 17 mL/min — ABNORMAL LOW (ref >60)
Glucose, Bld: 86 mg/dL (ref 70–99)
Phosphorus: 5.7 mg/dL — ABNORMAL HIGH (ref 2.5–4.6)
Potassium: 5.5 mmol/L — ABNORMAL HIGH (ref 3.5–5.1)
Sodium: 134 mmol/L — ABNORMAL LOW (ref 135–145)

## 2022-05-13 LAB — GLUCOSE, CAPILLARY
Glucose-Capillary: 89 mg/dL (ref 70–99)
Glucose-Capillary: 80 mg/dL (ref 70–99)

## 2022-05-13 MED ORDER — SODIUM CHLORIDE 0.9 % IV BOLUS
500.0000 mL | Freq: Once | INTRAVENOUS | Status: AC
  Administered 2022-05-13: 500 mL via INTRAVENOUS

## 2022-05-13 MED ORDER — SODIUM ZIRCONIUM CYCLOSILICATE 10 G PO PACK
10.0000 g | PACK | Freq: Once | ORAL | Status: AC
  Administered 2022-05-13: 10 g via ORAL
  Filled 2022-05-13: qty 1

## 2022-05-13 NOTE — Progress Notes (Signed)
Palliative Care Progress Note, Assessment & Plan   Patient Name: Yolanda [name]       Date: 05/13/2022 DOB: [birth year]  Age: 66 y.o. MRN#: [id number] Attending Physician: Tawni Millers Primary Care Physician: Celene Squibb, MD Admit Date: 04/28/2022  Reason for Consultation/Follow-up: Establishing goals of care  Subjective: Patient is lying in bed and asleep.  She is easily arousable.  Once awake, she acknowledges my presence and is able to make her wishes known.  She continues to complain of dry mouth.    Summary of counseling/coordination of care: After reviewing the patient's chart and assessing the patient at bedside, I spoke with patient in regards to plan of care.   During our discussion, RN Mikle Bosworth entered the room with an Ensure.  Mikle Bosworth and I discussed that patient has had poor p.o. intake both yesterday and today.  When questioned why patient is not eating, patient shares she feels nauseous when she eats.  However, patient acknowledges she has not felt nauseous in the last 2 days and cannot give a good reason for not wanting to eat.  Ensure and p.o. intake was encouraged. Patient had no other complaints at this time.   I secure chatted Dr. Cathlean Sauer and discussed further evaluation of esophageal issues as documented earlier by SLP. Esophagram was postponed d/t N/V and will be re-addressed next week.   Addendum:   RN Mikle Bosworth contacted me via secure chat and requested that I speak with patient's daughter who was present at bedside. I returned to the room and discussed prognosis and goals of care with daughter Yolanda [name] and patient.   Yolanda [name] is concerned about patient's worsening kidney function (decreaising GFR and Cr over the last 5-6 days). On admission 15 days ago Cr was 0.65  and GFR was >60. Today, Cr is 2.94 and GFR is 17. Reviewed use of IVF bolus in attempt to improve kidney function.  Discussed AKI as well as kidney failure in setting of liver cirrhosis.   Daughter voices concern that kidney function along with liver issues is creating a clinical picture that patient will not get well enough to be able to return home with the level of care that daughters Yolanda [name] and Yolanda [name] are able to provide. Yolanda [name] brought up the idea of engaging with Hospice. We reviewed patient is not yet at the point of EOL care but that close monitoring and continued watchful waiting will guide disposition/prognosis.   Discussed importance of nutritional, functional, and cognitive status improvement that will also contribute to patient's overall QOL and prognosis.   Daughter has requested an update from Dr. Cathlean Sauer, who is aware of her wishes.   Therapeutic silence and active listening provided for patient and daughter to share their thoughts and emotions regarding current medical situation.  Emotional support provided.  PMT will continue to follow the patient throughout her hospitalization.   Physical Exam Vitals reviewed.  Constitutional:      General: She is not in acute distress.    Appearance: She is obese. She is not ill-appearing.  HENT:     Head: Normocephalic.  Neurological:     Mental Status: She is alert.  Palliative Assessment/Data: 50%    Total Time 70 minutes  Greater than 50%  of this time was spent counseling and coordinating care related to the above assessment and plan.  Thank you for allowing the Palliative Medicine Team to assist in the care of this patient.  [place] Ilsa Iha, FNP-BC Palliative Medicine Team Team Phone # [phone]

## 2022-05-13 NOTE — Progress Notes (Signed)
Progress Note   Patient: Yolanda [name] CBS:[id number] DOB: 09-28-1955 DOA: 04/28/2022     15 DOS: the patient was seen and examined on 05/13/2022   Brief hospital course: Mrs. Wolters was admitted to the hospital with the working diagnosis of decompensated heart failure.   66 yo female with the past medical history of NASH/ cirrhosis, paroxysmal atrial fibrillation, heart failure, COPD and obesity who presented with dyspnea. She was discharged from the hospital ([facility]) one day prior, for heart failure, pneumonia and urinary tract infection, she was in the hospital for 8 days. Patient at home was noted to have worsening oxygen desaturation down to the 60's and increased work of breathing that prompted her transfer to the ED. At the time she reached the ED she was in respiratory distress and was placed on non invasive mechanical ventilation. Her blood pressure was 118/52, HR 82, RR 31 and 02 saturation 96%, lungs with decreased breath sounds, diffuse wheezing and rales more right than left, increased work of breathing, heart with S1 and S2 present and rhythmic, abdomen with no distention, no lower extremity edema, patient was lethargic  but non focal.   ABG 7.34/ 42/ 73/ 24/ 93% Na 132, K 4,0 CL 104, bicarbonate 22, glucose 98, bun 16, cr 0.72  AST 42 ALT 18 BNP 103 Wbc 11.8 hgb 9.8 plt 222  Chest radiograph with cardiomegaly, bilateral hilar vascular congestion, bilateral interstitial infiltrates with more dense at the right lower lobe.   EKG 86 bpm, normal axis, normal qtc, atrial fibrillation rhythm, no significant ST segment or T wave changes.   Patient continue with high oxygen requirements and using Bipap intermittently.   10/22 worsening respiratory failure and required intubation.  Placed on broad spectrum antibiotic therapy with Zosyn.  10/25 liberated from invasive mechanical ventilation, patient with encephalopathy.  10/26 atrial fibrillation with RVR, required amiodarone  infusion.  10/28 transfer back to [facility].  10/29 continue with uncontrolled atrial fibrillation.  11/02 patient converted to sinus rhythm.  11/03 clinically improving but renal function still not stable.  11/04 trial of IV fluids bolus for worsening renal function.   Assessment and Plan: * Acute on chronic diastolic CHF (congestive heart failure) (HCC) Echocardiogram with preserved LV systolic function EF 70 to 84%, RV systolic function preserved, no significant valvular disease.   10/20 CT chest with bilateral ground glass opacities, predominant central location. Small bilateral pleural effusions, more right than left. New small pericardial effusion.   Documented urine output 200 cc Patient having orthostatic symptoms.  Blood pressure systolic 665 mmHg.   On metoprolol, on RAS inhibition due to worsening renal function. Patient hypovolemic on examination, will do a 500 cc isotonic saline bolus.  Avoid hypotension   Acute hypoxemic respiratory failure due to cardiogenic pulmonary edema.  Patient has completed antibiotic therapy for recurrent pneumonia, (present on admission)  Dysphagia, continue with aspiration precautions, patient was not able to complete esophagogram due to nausea.  Patient currently on a regular diet.   Cell count stable at 12,6   Essential hypertension Patient with metoprolol Hold on diuretics due to reduced GFR  Trial of isotonic saline at 500 cc bolus.   Paroxysmal atrial fibrillation with RVR (HCC) Atrial flutter, no converted to sinus rhythm.  Patient is on metoprolol and amiodarone  Patient has been placed on apixaban for anticoagulation.    AKI (acute kidney injury) ([place]) Hyponatremia, hypokalemia/ hyperkalemia, hypomagnesemia.  Renal function with serum cr at 2,94, K is 5.5 and serum bicarbonate at 27  Na 134, P 5,7   Patient with clinical signs of hypovolemia with dry mucous membranes, orthostatic symptoms and reduction in urine output. Plan to  add 500 cc bolus isotonic saline and follow up renal function in am.  Add one dose of sodium zirconium for hyperkalemia.  Avoid hypotension and continue to hold on diuretic therapy.    Liver cirrhosis secondary to NASH [facility]) Patient with no clinical signs of ascites, no signs of decompensated liver failure.  Elevated liver enzymes due to congested hepatopathy.   Continue with rifaximin, discontinue lactulose.   Acute metabolic encephalopathy Her encephalopathy has resolved.  Continue with Pt and OT.   GERD (gastroesophageal reflux disease) Continue antiacid therapy.   Hypothyroidism Continue with levothyroxine   Type 2 diabetes mellitus with hyperlipidemia (HCC) Hypoglycemia  Fating glucose is 86 this am.  Continue to hold on insulin therapy. Continue to encourage po intake,   Continue with statin therapy.   Class 3 obesity (HCC) Calculated BMI is 44.3 Patient with very poor physical functional status, uses a walker for ambulation.   Depression and anxiety,  Continue with daily duloxetine.  Discontinue alprazolam.         Subjective: Patient with no chest pain or dyspnea, positive dizziness when seating or standing, positive thirst and dry mouth Physical Exam: Vitals:   05/12/22 2100 05/13/22 0455 05/13/22 0831 05/13/22 0925  BP:  (!) 123/59  100/73  Pulse: 69 64  62  Resp:  16  18  Temp:  97.9 F (36.6 C)    TempSrc:  Oral    SpO2:  97% 96% 100%  Weight:  107.3 kg    Height:       Neurology awake and alert ENT with mild pallor, dry mucous membranes Cardiovascular with S1 and S2 present and rhythmic with no gallops, rubs or murmurs Respiratory with no rales or wheezing Abdomen with no distention  No lower extremity edema  Data Reviewed:    Family Communication: no family at the bedside   Disposition: Status is: Inpatient Remains inpatient appropriate because: heart failure and renal failure   Planned Discharge Destination:  Home    Author: Tawni Millers, MD 05/13/2022 9:37 AM  For on call review [email or web address].

## 2022-05-14 ENCOUNTER — Inpatient Hospital Stay (HOSPITAL_COMMUNITY): Payer: Medicare Other

## 2022-05-14 DIAGNOSIS — I5033 Acute on chronic diastolic (congestive) heart failure: Secondary | ICD-10-CM | POA: Diagnosis not present

## 2022-05-14 DIAGNOSIS — I10 Essential (primary) hypertension: Secondary | ICD-10-CM | POA: Diagnosis not present

## 2022-05-14 DIAGNOSIS — N179 Acute kidney failure, unspecified: Secondary | ICD-10-CM | POA: Diagnosis not present

## 2022-05-14 DIAGNOSIS — I480 Paroxysmal atrial fibrillation: Secondary | ICD-10-CM | POA: Diagnosis not present

## 2022-05-14 LAB — GLUCOSE, CAPILLARY
Glucose-Capillary: 78 mg/dL (ref 70–99)
Glucose-Capillary: 81 mg/dL (ref 70–99)

## 2022-05-14 LAB — BASIC METABOLIC PANEL
Anion gap: 14 (ref 5–15)
BUN: 60 mg/dL — ABNORMAL HIGH (ref 8–23)
CO2: 26 mmol/L (ref 22–32)
Calcium: 11.1 mg/dL — ABNORMAL HIGH (ref 8.9–10.3)
Chloride: 90 mmol/L — ABNORMAL LOW (ref 98–111)
Creatinine, Ser: 3.59 mg/dL — ABNORMAL HIGH (ref 0.44–1.00)
GFR, Estimated: 13 mL/min — ABNORMAL LOW (ref >60)
Glucose, Bld: 76 mg/dL (ref 70–99)
Potassium: 5.1 mmol/L (ref 3.5–5.1)
Sodium: 130 mmol/L — ABNORMAL LOW (ref 135–145)

## 2022-05-14 LAB — HEPATIC FUNCTION PANEL
ALT: 48 U/L — ABNORMAL HIGH (ref 0–44)
AST: 112 U/L — ABNORMAL HIGH (ref 15–41)
Albumin: 1.8 g/dL — ABNORMAL LOW (ref 3.5–5.0)
Alkaline Phosphatase: 84 U/L (ref 38–126)
Bilirubin, Direct: 0.7 mg/dL — ABNORMAL HIGH (ref 0.0–0.2)
Indirect Bilirubin: 1.5 mg/dL — ABNORMAL HIGH (ref 0.3–0.9)
Total Bilirubin: 2.2 mg/dL — ABNORMAL HIGH (ref 0.3–1.2)
Total Protein: 8 g/dL (ref 6.5–8.1)

## 2022-05-14 LAB — AMMONIA: Ammonia: 49 umol/L — ABNORMAL HIGH (ref 9–35)

## 2022-05-14 MED ORDER — AMIODARONE HCL IN DEXTROSE 360-4.14 MG/200ML-% IV SOLN
30.0000 mg/h | INTRAVENOUS | Status: DC
  Administered 2022-05-14 – 2022-05-15 (×2): 30 mg/h via INTRAVENOUS
  Filled 2022-05-14 (×2): qty 200

## 2022-05-14 MED ORDER — SODIUM CHLORIDE 0.9 % IV BOLUS
500.0000 mL | Freq: Once | INTRAVENOUS | Status: AC
  Administered 2022-05-14: 500 mL via INTRAVENOUS

## 2022-05-14 MED ORDER — SODIUM CHLORIDE 0.9 % IV SOLN: INTRAVENOUS | Status: DC

## 2022-05-14 MED ORDER — METOPROLOL TARTRATE 12.5 MG HALF TABLET
12.5000 mg | ORAL_TABLET | Freq: Two times a day (BID) | ORAL | Status: DC
  Administered 2022-05-15 – 2022-05-19 (×7): 12.5 mg via ORAL
  Filled 2022-05-14 (×8): qty 1

## 2022-05-14 MED ORDER — AMIODARONE HCL IN DEXTROSE 360-4.14 MG/200ML-% IV SOLN: 60.0000 mg/h | INTRAVENOUS | Status: DC

## 2022-05-14 NOTE — Progress Notes (Signed)
Mobility Specialist Progress Note    05/14/22 1320  Mobility  Activity Transferred to/from [facility]  Level of Assistance Minimal assist, patient does 75% or more  Assistive Device Front wheel walker  Distance Ambulated (ft) 4 ft (2+2)  Activity Response Tolerated fair  Mobility Referral Yes  $Mobility charge 1 Mobility   Pre-Mobility: 69 HR Post-Mobility: 67 HR  Pt received and agreeable to ambulation but requesting to use BM. Needed a rest break once sitting EOB and after first stand. C/o dizziness. Pivoted to [facility] and had BM. Pivoted back to bed c/o whole body feeling like it was going to give out. Deferred ambulation. Left in bed with call bell in reach and daughter present.  Hildred Alamin Mobility Specialist  Secure Chat Only

## 2022-05-14 NOTE — Progress Notes (Signed)
I have educated pt daughter [name] at [employer] about Hospice services verses Palliative care services at home in conjunction with [facility] services. [name] wants her mother to have every chance possible to improve if that is possible. She also would like her mother to be comfortable at  home if improvement is not possible. She is hoping to meet at the hospital tomorrow with a Hospice liaison or someone from Palliative care at the hospital. If she is not present when those representatives come, please call her at  [phone].   Feel free to call Hospice with any questions. [phone]. Lin Landsman, RN BSN [facility]

## 2022-05-14 NOTE — Consult Note (Signed)
Renal Service Consult Note [facility]  Yolanda [name] 05/14/2022 Sol Blazing, MD Requesting Physician: Dr. Cathlean Sauer  Reason for Consult: Renal failure HPI: The patient is a 66 y.o. year-old w/ PMH as below who presented w/ acute on chronic resp failure on 04/27/22. She was dx'd w/ multifocal PNA and vol overload, poss asp PNA. She was rx'd w/ IV abx / zosyn. She required vent support from 10/22- 10/25. She rec'd IV lasix and aldactone and creat was stable in the 0.7- 1.0 range. Creat started to rise on 10/31 and has steadily worsened up to 2.9 yest and 3.5 despite discontinuation of the diuretics around 10/29- 11/01. We are asked to see for renal failure.   Pt seen in room. Pt denies SOB or couhg, no hx of renal failure.   ROS - denies CP, no joint pain, no HA, no blurry vision, no rash, no diarrhea, no nausea/ vomiting, no dysuria, no difficulty voiding   Past Medical History  Past Medical History:  Diagnosis Date   Abnormal uterine bleeding    Anxiety    Arthritis    Chronic abdominal pain    Chronic pain in left foot    Depression    Elevated liver function tests 2018   Endometriosis    Fibromyalgia    GERD 12/21/2009   Qualifier: Diagnosis of  By: Craige Cotta     Hypertension    stopped meds in Aug 2015   Hypothyroidism    IBS (irritable bowel syndrome)    Internal hemorrhoids    Kidney stone    Liver cirrhosis secondary to NASH (HCC)    Obesity, morbid (HCC)    PONV (postoperative nausea and vomiting)    Psoriatic arthritis ([place])    Pulmonary embolism ([place]) 12/2005   Qualifier: Diagnosis of  By: Kellie Simmering LPN, Almyra Free     Rheumatoid arthritis [facility])    Sleep apnea    Vitamin D deficiency    Past Surgical History  Past Surgical History:  Procedure Laterality Date   ABDOMINAL SURGERY     laparoscopy   BIOPSY N/A 03/24/2014   Procedure: GASTRIC BIOPSIES;  Surgeon: Danie Binder, MD;  Location: [facility];  Service: Endoscopy;  Laterality: N/A;    BIOPSY  10/28/2019   Procedure: BIOPSY;  Surgeon: Danie Binder, MD;  Location: [facility];  Service: Endoscopy;;  gastric nodule   BREAST LUMPECTOMY Right    CESAREAN SECTION     X2   COLONOSCOPY  2006   internal hemorrhoids   COLONOSCOPY WITH PROPOFOL N/A 03/24/2014   Dr. [name]: 2 tubular adenomas removed, hemorrhoids   COLONOSCOPY WITH PROPOFOL N/A 10/28/2019   Fields: External and internal hemorrhoids, 8 polyps ranging from 2 to 5 mm in size removed from the colon.  Multiple tubular adenomas.  Next colonoscopy in 3 years.   CYSTOSCOPY W/ RETROGRADES  01/23/2012   Procedure: CYSTOSCOPY WITH RETROGRADE PYELOGRAM;  Surgeon: Marissa Nestle, MD;  Location: [facility];  Service: Urology;  Laterality: Left;   DILATATION & CURETTAGE/HYSTEROSCOPY WITH MYOSURE N/A 11/18/2014   Procedure: DILATATION & CURETTAGE/HYSTEROSCOPY WITH MYOSURE, resection of polyp;  Surgeon: Cheri Fowler, MD;  Location: [place] [facility];  Service: Gynecology;  Laterality: N/A;   DILATION AND CURETTAGE OF UTERUS     ESOPHAGOGASTRODUODENOSCOPY   08/24/2006   Dr. Veto Kemps erythema of the antrum without erosion or ulcers/Otherwise, normal esophagus without evidence of Barrett's path with chronic gastritis   ESOPHAGOGASTRODUODENOSCOPY (EGD) WITH PROPOFOL N/A 03/24/2014   Dr.  Fields: gastritis   ESOPHAGOGASTRODUODENOSCOPY (EGD) WITH PROPOFOL N/A 10/28/2019   Fields: Esophagus appeared normal, empiric dilation due to history of dysphagia.  6 mm nodule seen in the gastric cardia.  Mild portal hypertensive gastropathy.  Nodule from the stomach biopsied and showed mild chronic gastritis, no H. pylori.   POLYPECTOMY N/A 03/24/2014   Procedure: POLYPECTOMY;  Surgeon: Danie Binder, MD;  Location: [facility];  Service: Endoscopy;  Laterality: N/A;   POLYPECTOMY  10/28/2019   Procedure: POLYPECTOMY;  Surgeon: Danie Binder, MD;  [facility];  Service: Endoscopy;;  hepatic flexure, ascending colon,sigmoid colon, rectal   REMOVAL OF  STONES  01/23/2012   Procedure: REMOVAL OF STONES;  Surgeon: Marissa Nestle, MD;  Location: [facility];  Service: Urology;  Laterality: N/A;   SAVORY DILATION N/A 10/28/2019   Procedure: SAVORY DILATION;  Surgeon: Danie Binder, MD;  [facility];  Service: Endoscopy;  Laterality: N/A;   TUBAL LIGATION     Family History  Family History  Problem Relation Age of Onset   Heart attack Mother        CABG   Stroke Mother    Hypertension Mother    Cancer Mother    Diabetes Mother    Heart attack Father        CABG   Hypertension Father    Mesothelioma Father    Heart attack Brother    Diabetes Brother    Depression Brother    Hyperlipidemia Brother    Diabetes Maternal Grandmother    Diabetes Maternal Grandfather    Cancer Paternal Grandmother    Diabetes Brother    Hyperlipidemia Brother    Wilson's disease Other        2 nieces, one died in her 85s   Colon cancer Neg Hx    Social History  reports that she quit smoking about 23 years ago. Her smoking use included cigarettes. She has never used smokeless tobacco. She reports that she does not currently use alcohol. She reports that she does not use drugs. Allergies  Allergies  Allergen Reactions   Azithromycin Hives   Lisinopril Cough   Morphine Hives   Sulfa Antibiotics Other (See Comments)    Yeast infection   Home medications Prior to Admission medications   Medication Sig Start Date End Date Taking? Authorizing Provider  acetaminophen (TYLENOL) 325 MG tablet Take 2 tablets (650 mg total) by mouth every 6 (six) hours as needed for mild pain, headache or fever (or Fever >/= 101). 04/27/22  Yes Emokpae, Courage, MD  albuterol (VENTOLIN HFA) 108 (90 Base) MCG/ACT inhaler Inhale 1-2 puffs into the lungs every 6 (six) hours as needed for wheezing or shortness of breath. 11/08/19  Yes Long, Wonda Olds, MD  aspirin EC 81 MG tablet Take 81 mg by mouth daily. Swallow whole.   Yes [provider]   budesonide-formoterol (SYMBICORT) 80-4.5 MCG/ACT inhaler Take 2 puffs first thing in am and then another 2 puffs about 12 hours later. Patient taking differently: Inhale 2 puffs into the lungs 2 (two) times daily. Take 2 puffs first thing in am and then another 2 puffs about 12 hours later. 11/25/21  Yes Tanda Rockers, MD  Calcium Carb-Cholecalciferol (CALCIUM 600 + D PO) Take 1 tablet by mouth daily.   Yes [provider]  Cholecalciferol (VITAMIN D3) 2000 units TABS Take 2,000 Units by mouth daily.    Yes [provider]  clotrimazole (MYCELEX) 10 MG troche  Take 10 mg by mouth 5 (five) times daily.   Yes [provider]  diltiazem (CARDIZEM CD) 120 MG 24 hr capsule Take 1 capsule (120 mg total) by mouth daily. 04/27/22 04/27/23 Yes Emokpae, Courage, MD  DULoxetine (CYMBALTA) 60 MG capsule Take 60 mg by mouth daily. 07/15/21  Yes [provider]  furosemide (LASIX) 40 MG tablet Take 1 tablet (40 mg total) by mouth daily. 04/27/22 10/24/22 Yes Emokpae, Courage, MD  inFLIXimab (REMICADE IV) Inject 1 Syringe into the vein every 3 (three) months.   Yes [provider]  lactulose, encephalopathy, (CHRONULAC) 10 GM/15ML SOLN Take 30 mLs (20 g total) by mouth 2 (two) times daily. Goal is 2 to 3 soft/mushy BMs, Ok to take additional dose if No BM in 24 hrs 04/27/22  Yes Emokpae, Courage, MD  levothyroxine (SYNTHROID) 75 MCG tablet Take 75 mcg by mouth daily. 12/26/21  Yes [provider]  meclizine (ANTIVERT) 25 MG tablet Take 25 mg by mouth 3 (three) times daily as needed for dizziness.   Yes [provider]  Melatonin 10 MG CAPS Take 10 mg by mouth at bedtime as needed (sleep).   Yes [provider]  metFORMIN (GLUCOPHAGE-XR) 500 MG 24 hr tablet Take 1 tablet (500 mg total) by mouth 2 (two) times daily with a meal. 04/27/22  Yes Emokpae, Courage, MD  metoprolol tartrate (LOPRESSOR) 25 MG tablet Take 1 tablet (25 mg total) by mouth 2  (two) times daily. 04/27/22  Yes Emokpae, Courage, MD  midodrine (PROAMATINE) 5 MG tablet Take 1 tablet (5 mg total) by mouth 2 (two) times daily with a meal. 04/27/22  Yes Emokpae, Courage, MD  pantoprazole (PROTONIX) 40 MG tablet Take 40 mg by mouth 2 (two) times daily.   Yes [provider]  pilocarpine (SALAGEN) 5 MG tablet Take 5 mg by mouth 2 (two) times daily.   Yes [provider]  potassium chloride (KLOR-CON) 10 MEQ tablet Take 1 tablet (10 mEq total) by mouth daily. Take While taking Lasix/furosemide 04/27/22  Yes Emokpae, Courage, MD  rifaximin (XIFAXAN) 550 MG TABS tablet Take 1 tablet (550 mg total) by mouth 2 (two) times daily. 04/27/22  Yes Emokpae, Courage, MD  rosuvastatin (CRESTOR) 10 MG tablet Take 10 mg by mouth daily.   Yes [provider]  spironolactone (ALDACTONE) 50 MG tablet Take 1 tablet (50 mg total) by mouth daily. 04/27/22 04/27/23 Yes Emokpae, Courage, MD  sucralfate (CARAFATE) 1 g tablet Take 1 g by mouth 4 (four) times daily. 01/25/22  Yes [provider]  blood glucose meter kit and supplies Dispense based on patient and insurance preference. Use up to four times daily as directed. (FOR ICD-10 E10.9, E11.9). 09/22/21   Murlean Iba, MD     Vitals:   05/14/22 0031 05/14/22 0417 05/14/22 0500 05/14/22 0934  BP:  (!) 91/50    Pulse:  61    Resp:  17    Temp:  (!) 97.4 F (36.3 C)    TempSrc:  Oral    SpO2:  98%  100%  Weight: 105.7 kg  105.7 kg   Height:       Exam Gen alert, no distress, obese WF no distress, nasal O2 No rash, cyanosis or gangrene Sclera anicteric, throat clear  No jvd or bruits Chest clear bilat to bases, no rales/ wheezing RRR no MRG Abd soft ntnd no mass or ascites +bs, obese GU defer MS no joint effusions or deformity Ext no  LE or UE edema, diffuse purplish discoloration w/ livedo pattern on bilat LE"s below the knees Neuro is alert, Ox 3 , nf, no asterixis    I/O since admit 10/10 =  12 L in and 29 L out = net neg 16 liters   Wt's 117kg on admit, nadir 104kg,  today 106kg   IV lasix 40-80 mg from 10/20- 10/29 dc'd   Po aldactone 25-50 mg from 10/20 - 11/01 dc'd   Torsemide 20 mg qd from 10/30 --> 11/02 dc'd   No acei/ ARB, no IV contrast    Creat 0.7- 1.0 from 10/19 - 10/30    Creat 1.2 -- > 3.5 from 10/31 - today     IV zosyn 10/22- 10/29    Hx cirrhosis/ NASH, hx PE, HFpEF, morbid obesity     Last CXR 11/05 -  IMPRESSION: Cardiomegaly. There is interval decrease in pulmonary vascular congestion and pulmonary edema. There is interval decrease in bilateral pleural effusions with small bilateral residual effusions.       Na 130  K 5.1  CO2 26  BUN 60  Creat 3.59  Alb 2.1  WBC 12 Hb 13   UA 10-/29 - trace Hb, neg protein, 0-5 rbc, no WBC   REnal US 12-13 cm kidneys w/o hydro  Assessment/ Plan: AKI - b/l creat 0.7 on admission. Creat up to 3.5 after about 10 days of IV lasix. No nephrotoxins, contrast or ACEi/ ARB. On exam has livedo pattern in LE's c/w hypoperfusion. Pt is down 10-12 kg from admission. BP's are down a bit, UA is negative, renal US today w/o obstruction. Suspect volume depletion is main contributor. Diuretics were dc'd several days ago. Recommend bolus of 750- 1000 cc and start IVF"s at 75 cc/hr. Doubt ATN as UOP has been good and doubt any acute GN or AIN with normal UA/ no wbcs/ rbc's. Will follow.  AHRF - w/ multifocal / aspiration PNA. SP abx.  Volume depletion - as above Morbid obesity NASH/ cirrhosis   Rob Jonnie Finner  MD 05/14/2022, 3:34 PM Recent Labs  Lab 05/08/22 0441 05/09/22 0054 05/10/22 0121 05/11/22 0055 05/12/22 0052 05/13/22 0057 05/14/22 0115  HGB 12.2   < > 12.8  --  13.7  --   --   ALBUMIN 2.1*  --   --   --   --  2.1*  --   CALCIUM 10.2   < > 10.3   < > 11.4* 11.7* 11.1*  PHOS  --   --   --   --   --  5.7*  --   CREATININE 1.05*   < > 1.72*   < > 2.28* 2.94* 3.59*  K 3.1*   < > 4.4   < > 5.0 5.5* 5.1   < > = values in this  interval not displayed.   Inpatient medications:  amiodarone  200 mg Oral Daily   apixaban  5 mg Oral BID   budesonide (PULMICORT) nebulizer solution  0.5 mg Nebulization BID   DULoxetine  60 mg Oral Daily   feeding supplement  237 mL Oral BID BM   folic acid  1 mg Oral Daily   levothyroxine  75 mcg Oral Q0600   metoprolol tartrate  25 mg Oral BID   multivitamin with minerals  1 tablet Oral Daily   rifaximin  550 mg Oral BID   rosuvastatin  10 mg Oral Daily    sodium chloride Stopped (04/30/22 0410)   sodium chloride 75  mL/hr at 05/14/22 1236   acetaminophen, albuterol, guaiFENesin, hydrOXYzine, lip balm, melatonin, ondansetron (ZOFRAN) IV, mouth rinse

## 2022-05-14 NOTE — Progress Notes (Signed)
Progress Note   Patient: Yolanda [name] YJE:[id number] DOB: 19-Oct-1955 DOA: 04/28/2022     16 DOS: the patient was seen and examined on 05/14/2022   Brief hospital course: Yolanda [name] was admitted to the hospital with the working diagnosis of decompensated heart failure.   66 yo female with the past medical history of NASH/ cirrhosis, paroxysmal atrial fibrillation, heart failure, COPD and obesity who presented with dyspnea. She was discharged from the hospital (AP) one day prior, for heart failure, pneumonia and urinary tract infection, she was in the hospital for 8 days. Patient at home was noted to have worsening oxygen desaturation down to the 60's and increased work of breathing that prompted her transfer to the ED. At the time she reached the ED she was in respiratory distress and was placed on non invasive mechanical ventilation. Her blood pressure was 118/52, HR 82, RR 31 and 02 saturation 96%, lungs with decreased breath sounds, diffuse wheezing and rales more right than left, increased work of breathing, heart with S1 and S2 present and rhythmic, abdomen with no distention, no lower extremity edema, patient was lethargic  but non focal.   ABG 7.34/ 42/ 73/ 24/ 93% Na 132, K 4,0 CL 104, bicarbonate 22, glucose 98, bun 16, cr 0.72  AST 42 ALT 18 BNP 103 Wbc 11.8 hgb 9.8 plt 222  Chest radiograph with cardiomegaly, bilateral hilar vascular congestion, bilateral interstitial infiltrates with more dense at the right lower lobe.   EKG 86 bpm, normal axis, normal qtc, atrial fibrillation rhythm, no significant ST segment or T wave changes.   Patient continue with high oxygen requirements and using Bipap intermittently.   10/22 worsening respiratory failure and required intubation.  Placed on broad spectrum antibiotic therapy with Zosyn.  10/25 liberated from invasive mechanical ventilation, patient with encephalopathy.  10/26 atrial fibrillation with RVR, required amiodarone  infusion.  10/28 transfer back to [facility].  10/29 continue with uncontrolled atrial fibrillation.  11/02 patient converted to sinus rhythm.  11/03 clinically improving but renal function still not stable.  11/04 trial of IV fluids bolus for worsening renal function.  11/05 continue worsening renal function, suspected ATN.   Assessment and Plan: * Acute on chronic diastolic CHF (congestive heart failure) (HCC) Echocardiogram with preserved LV systolic function EF 70 to 63%, RV systolic function preserved, no significant valvular disease.   10/20 CT chest with bilateral ground glass opacities, predominant central location. Small bilateral pleural effusions, more right than left. New small pericardial effusion.   Documented urine output 500 cc Patient having orthostatic symptoms.  Blood pressure systolic 91 to 785 mmHg.   On metoprolol, No RAS inhibition due to worsening renal function.   Acute hypoxemic respiratory failure due to cardiogenic pulmonary edema.  Patient has completed antibiotic therapy for recurrent pneumonia, (present on admission)  Dysphagia, continue with aspiration precautions, patient was not able to complete esophagogram due to nausea.  Patient currently on a regular diet.   Cell count stable at 12,6   Essential hypertension Patient with metoprolol Hold on diuretics due to reduced GFR  Paroxysmal atrial fibrillation with RVR (HCC) Atrial flutter, no converted to sinus rhythm.  Patient is on metoprolol and amiodarone  Patient has been placed on apixaban for anticoagulation.    AKI (acute kidney injury) ([place]) Hyponatremia, hypokalemia/ hyperkalemia, hypomagnesemia. Suspected acute tubular necrosis.   Patient had 500 cc NS bolus yesterday, with not response in terms of renal function, with worsening serum cr at 3,59 with K at 5,1  and serum bicarbonate at 26.  Urine output is 500 cc over last 24 hrs  Bladder scan yesterday with not reported retention, will do  renal US.   Continue to avoid nephrotoxic medications and hypotension, patient with no clinical signs of volume overload. Discontinue pantoprazole.  Plan to consult nephrology for further recommendations.    Liver cirrhosis secondary to NASH [facility]) Patient with no clinical signs of ascites, no signs of decompensated liver failure.  Elevated liver enzymes due to congested hepatopathy.   Continue with rifaximin.    Acute metabolic encephalopathy Her encephalopathy has resolved.  Continue with Pt and OT.   GERD (gastroesophageal reflux disease) Continue antiacid therapy.   Hypothyroidism Continue with levothyroxine   Type 2 diabetes mellitus with hyperlipidemia (HCC) Hypoglycemia  Fating glucose is 76 this am.  Continue to hold on insulin therapy. Continue to encourage po intake,   Continue with statin therapy.   Class 3 obesity (HCC) Calculated BMI is 44.3 Patient with very poor physical functional status, uses a walker for ambulation.   Depression and anxiety,  Continue with daily duloxetine.  Discontinue alprazolam.         Subjective: Patient with no chest pain or dyspnea, she reported dizziness. Patient crying today at the time of my visit, she is frustrated of being  hospitalized.   Physical Exam: Vitals:   05/13/22 2124 05/14/22 0031 05/14/22 0417 05/14/22 0500  BP: 103/61  (!) 91/50   Pulse:   61   Resp:   17   Temp:   (!) 97.4 F (36.3 C)   TempSrc:   Oral   SpO2:   98%   Weight:  105.7 kg  105.7 kg  Height:        Neurology awake and alert,  ENT with pallor Cardiovascular with S1 and S2 present and rhythmic with no gallops, rubs or murmurs Respiratory with no rales or wheezing Abdomen with no distention  No lower extremity edema  Data Reviewed:    Family Communication: no family at the bedside   Disposition: Status is: Inpatient Remains inpatient appropriate because: worsening renal function   Planned Discharge Destination: Skilled  nursing facility    Author: Tawni Millers, MD 05/14/2022 9:11 AM  For on call review [email or web address].

## 2022-05-15 DIAGNOSIS — N179 Acute kidney failure, unspecified: Secondary | ICD-10-CM | POA: Diagnosis not present

## 2022-05-15 DIAGNOSIS — I10 Essential (primary) hypertension: Secondary | ICD-10-CM | POA: Diagnosis not present

## 2022-05-15 DIAGNOSIS — E1169 Type 2 diabetes mellitus with other specified complication: Secondary | ICD-10-CM | POA: Diagnosis not present

## 2022-05-15 DIAGNOSIS — K7581 Nonalcoholic steatohepatitis (NASH): Secondary | ICD-10-CM | POA: Diagnosis not present

## 2022-05-15 DIAGNOSIS — I480 Paroxysmal atrial fibrillation: Secondary | ICD-10-CM | POA: Diagnosis not present

## 2022-05-15 DIAGNOSIS — I5033 Acute on chronic diastolic (congestive) heart failure: Secondary | ICD-10-CM | POA: Diagnosis not present

## 2022-05-15 LAB — HEPATIC FUNCTION PANEL
ALT: 47 U/L — ABNORMAL HIGH (ref 0–44)
AST: 102 U/L — ABNORMAL HIGH (ref 15–41)
Albumin: 1.7 g/dL — ABNORMAL LOW (ref 3.5–5.0)
Alkaline Phosphatase: 96 U/L (ref 38–126)
Bilirubin, Direct: 0.6 mg/dL — ABNORMAL HIGH (ref 0.0–0.2)
Indirect Bilirubin: 1.2 mg/dL — ABNORMAL HIGH (ref 0.3–0.9)
Total Bilirubin: 1.8 mg/dL — ABNORMAL HIGH (ref 0.3–1.2)
Total Protein: 8 g/dL (ref 6.5–8.1)

## 2022-05-15 LAB — CBC
HCT: 35.6 % — ABNORMAL LOW (ref 36.0–46.0)
Hemoglobin: 11.9 g/dL — ABNORMAL LOW (ref 12.0–15.0)
MCH: 32 pg (ref 26.0–34.0)
MCHC: 33.4 g/dL (ref 30.0–36.0)
MCV: 95.7 fL (ref 80.0–100.0)
Platelets: UNDETERMINED 10*3/uL (ref 150–400)
RBC: 3.72 MIL/uL — ABNORMAL LOW (ref 3.87–5.11)
RDW: 17.2 % — ABNORMAL HIGH (ref 11.5–15.5)
WBC: 11.2 10*3/uL — ABNORMAL HIGH (ref 4.0–10.5)
nRBC: 0.3 % — ABNORMAL HIGH (ref 0.0–0.2)

## 2022-05-15 LAB — RENAL FUNCTION PANEL
Albumin: 1.7 g/dL — ABNORMAL LOW (ref 3.5–5.0)
Anion gap: 8 (ref 5–15)
BUN: 57 mg/dL — ABNORMAL HIGH (ref 8–23)
CO2: 27 mmol/L (ref 22–32)
Calcium: 10.1 mg/dL (ref 8.9–10.3)
Chloride: 99 mmol/L (ref 98–111)
Creatinine, Ser: 3.45 mg/dL — ABNORMAL HIGH (ref 0.44–1.00)
GFR, Estimated: 14 mL/min — ABNORMAL LOW (ref >60)
Glucose, Bld: 84 mg/dL (ref 70–99)
Phosphorus: 4.7 mg/dL — ABNORMAL HIGH (ref 2.5–4.6)
Potassium: 5 mmol/L (ref 3.5–5.1)
Sodium: 134 mmol/L — ABNORMAL LOW (ref 135–145)

## 2022-05-15 LAB — GLUCOSE, CAPILLARY
Glucose-Capillary: 77 mg/dL (ref 70–99)
Glucose-Capillary: 85 mg/dL (ref 70–99)
Glucose-Capillary: 69 mg/dL — ABNORMAL LOW (ref 70–99)

## 2022-05-15 MED ORDER — AMIODARONE HCL 200 MG PO TABS
200.0000 mg | ORAL_TABLET | Freq: Every day | ORAL | Status: DC
  Administered 2022-05-15 – 2022-05-19 (×5): 200 mg via ORAL
  Filled 2022-05-15 (×5): qty 1

## 2022-05-15 NOTE — Progress Notes (Signed)
[place]:  We received the referral over the weekend to discuss with daughter  [name] [phone], about hospice services vs palliative care services.  Tharon Aquas did have this discussion with her over the phone and had planned on [place] meeting the pt and daughter today. However, this pt will be going home with her daughter [name] and she lives in [place], [place]. This is not in our services area unfortunately. I have reached out to the CM and SW today to update them so they can make the referral to the agency that can serve them.   I have updated the daughter [name] as well.   Webb Silversmith RN BSN [facility] [phone]

## 2022-05-15 NOTE — Progress Notes (Signed)
Occupational Therapy Treatment Patient Details Name: Yolanda [name] MRN: [id number] DOB: 11-Aug-1955 Today's Date: 05/15/2022   History of present illness Pt is 66 y/o F admitted to [facility] on 04/28/22 wih SOB, hypoxia, acute on chronic heart failure, Afib with RVR. Worsening hypoxic respiratory failure 10/22 with transfer to ICU; ETT 10/22-10/25. Of note, recent hosptial admission 10/11-10/19 with acute encephalopathy. PMH of anxiety, depression, fibroylagia, GERD, HTN, hypothyroidism, obesity, RA, PE, liver cirrhosis.   OT comments  Upon arrival, pt's [place] was out of nose with SpO2 reading 92%. 3L donned for session. Overall she was min G for bed mobility and donned mesh underwear with sit<>stand transfer and min A in preparation for mobility. Upon standing pt noted to have decreased attention, decreased LOA and reported dizziness. BP in supine 104/49 (65), to 83/45 (58) in standing. Pt returned supine with improvement in symptoms and vitals. RN made aware. OT to continue to follow. POC updated as pt's does not have the activity tolerance. Per chart, pt planning to d/c home with family and home health, palliative or hospice.    Recommendations for follow up therapy are one component of a multi-disciplinary discharge planning process, led by the attending physician.  Recommendations may be updated based on patient status, additional functional criteria and insurance authorization.    Follow Up Recommendations  Other (comment) (pending palliative vs. hospice decision. Pt would benefit from max home health services)    Assistance Recommended at Discharge Frequent or constant Supervision/Assistance  Patient can return home with the following  A little help with walking and/or transfers;A lot of help with bathing/dressing/bathroom;Assistance with cooking/housework;Direct supervision/assist for financial management;Assist for transportation;Help with stairs or ramp for entrance   Equipment  Recommendations  None recommended by OT       Precautions / Restrictions Precautions Precautions: Fall Precaution Comments: watch SpO2, on 3L this session Restrictions Weight Bearing Restrictions: No       Mobility Bed Mobility Overal bed mobility: Needs Assistance Bed Mobility: Supine to Sit, Sit to Supine     Supine to sit: Min guard Sit to supine: Min guard   General bed mobility comments: cues needed for sequencing and attention    Transfers Overall transfer level: Needs assistance Equipment used: Rolling walker (2 wheels) Transfers: Sit to/from Stand Sit to Stand: Min guard           General transfer comment: pt with decreased alertness upon standing, report of feeling dizzy.     Balance Overall balance assessment: Needs assistance Sitting-balance support: No upper extremity supported, Feet supported Sitting balance-Leahy Scale: Fair     Standing balance support: Single extremity supported, During functional activity Standing balance-Leahy Scale: Poor                             ADL either performed or assessed with clinical judgement   ADL Overall ADL's : Needs assistance/impaired                     Lower Body Dressing: Minimal assistance;Sit to/from stand;Cueing for sequencing Lower Body Dressing Details (indicate cue type and reason): donned mesh underwear             Functional mobility during ADLs: Min guard;Rolling walker (2 wheels);Cueing for safety General ADL Comments: limited by symptomatic drop in BP    Extremity/Trunk Assessment Upper Extremity Assessment Upper Extremity Assessment: Generalized weakness   Lower Extremity Assessment Lower Extremity Assessment: Defer to PT evaluation  Vision   Vision Assessment?: No apparent visual deficits   Perception Perception Perception: Not tested   Praxis Praxis Praxis: Not tested    Cognition Arousal/Alertness: Awake/alert Behavior During Therapy: WFL  for tasks assessed/performed Overall Cognitive Status: Impaired/Different from baseline Area of Impairment: Attention, Memory, Following commands, Safety/judgement, Awareness, Problem solving                 Orientation Level: Disoriented to, Situation Current Attention Level: Selective Memory: Decreased recall of precautions, Decreased short-term memory Following Commands: Follows one step commands with increased time Safety/Judgement: Decreased awareness of safety, Decreased awareness of deficits Awareness: Emergent Problem Solving: Slow processing, Decreased initiation, Requires verbal cues General Comments: slowed processing and easily distracted, limited insight              General Comments Pt not wearing O2 upon arrival, donned for session. 104/49 (65) in supine, 83/45 (58) upon standing. 105/56 (72) supine    Pertinent Vitals/ Pain       Pain Assessment Pain Assessment: No/denies pain Faces Pain Scale: No hurt Pain Intervention(s): Monitored during session   Frequency  Min 2X/week        Progress Toward Goals  OT Goals(current goals can now be found in the care plan section)  Progress towards OT goals: Progressing toward goals  Acute Rehab OT Goals Patient Stated Goal: to feel better OT Goal Formulation: With patient Time For Goal Achievement: 05/19/22 Potential to Achieve Goals: Good ADL Goals Pt Will Perform Eating: with mod assist;sitting Pt Will Perform Grooming: with min guard assist;standing Pt Will Perform Lower Body Bathing: with modified independence;with adaptive equipment;sitting/lateral leans Pt Will Perform Upper Body Dressing: with set-up;sitting Pt Will Perform Lower Body Dressing: with modified independence;sitting/lateral leans;with adaptive equipment Pt Will Transfer to Toilet: with modified independence;ambulating Pt/caregiver will Perform Home Exercise Program: Increased strength;Both right and left upper extremity;With minimal  assist Additional ADL Goal #1: Will perform bed mobility independently as a precurson to ADLs. Additional ADL Goal #2: Pt will demonstrate improved endurance by standing x 2 minutes during ADLs. Additional ADL Goal #3: Pt will demonstrat selective attention during ADLs and mobility.  Plan Discharge plan needs to be updated       AM-PAC OT "6 Clicks" Daily Activity     Outcome Measure   Help from another person eating meals?: None Help from another person taking care of personal grooming?: A Little Help from another person toileting, which includes using toliet, bedpan, or urinal?: A Little Help from another person bathing (including washing, rinsing, drying)?: A Lot Help from another person to put on and taking off regular upper body clothing?: A Little Help from another person to put on and taking off regular lower body clothing?: A Little 6 Click Score: 18    End of Session Equipment Utilized During Treatment: Rolling walker (2 wheels);Oxygen (3L)  OT Visit Diagnosis: Muscle weakness (generalized) (M62.81);Other symptoms and signs involving cognitive function   Activity Tolerance Other (comment) (drop in BP)   Patient Left in bed;with call bell/phone within reach;with bed alarm set   Nurse Communication Mobility status (drop in BP/MAP)        Time: 6222-9798 OT Time Calculation (min): 20 min  Charges: OT General Charges $OT Visit: 1 Visit OT Treatments $Self Care/Home Management : 8-22 mins    Elliot Cousin 05/15/2022, 1:02 PM

## 2022-05-15 NOTE — Progress Notes (Signed)
                                                     Palliative Care Progress Note, Assessment & Plan   Patient Name: Yolanda [name]       Date: 05/15/2022 DOB: 1956-05-20  Age: 66 y.o. MRN#: [id number] Attending Physician: Tawni Millers Primary Care Physician: Celene Squibb, MD Admit Date: 04/28/2022  Reason for Consultation/Follow-up: Establishing goals of care  Subjective: Pt is lying in bed in NAD. She acknowledges my presence and is able to make her wishes known. She denies pain and nausea. She says she drank milk and did not feel nauseous. Her daughter Yolanda [name] is at bedside.   Summary of counseling/coordination of care: After reviewing the patient's chart and assessing the patient at bedside, Yolanda [name] inquired about discharge planning. We discussed plans for CIR or SNF for rehab remain but that patient is not medically ready for this yet. TOC is following closely.  We discussed watchful waiting, AKI, and nephrology's recommendation of using IVF to hopefully help with AKI. We also discussed importance of nutrition and encouraged increased PO intake.   Yolanda [name] verbalized that she wants to honor her mother's wishes in the event that Yolanda [name]/patient is unable to speak for herself. Advanced directive reviewed but not completed at this time. Patient continues to endorse that both daughters will jointly be surrogate decision makers for patient. See spiritual care note for further AD details.   Concept of MOST introduced. Pt confirmed DNR status. She also shares she would likely never want a feeding tube. We discussed use of MOST form once patient d/cs to a rehab/non-hospital facility. Copies left for pt and family to review.   Questions and concerns were addressed.   Therapeutic silence and active listening provided for patient to share her thoughts  and emotions regarding current medical situation.  Emotional support provided.  Physical Exam Vitals reviewed.  Constitutional:      General: She is not in acute distress.    Appearance: She is obese. She is not ill-appearing.  HENT:     Head: Normocephalic.     Mouth/Throat:     Mouth: Mucous membranes are moist.  Cardiovascular:     Rate and Rhythm: Normal rate.     Pulses: Normal pulses.  Pulmonary:     Effort: Pulmonary effort is normal.     Comments: [place] in place Musculoskeletal:     Comments: Generalized weakness  Skin:    General: Skin is warm and dry.  Psychiatric:        Mood and Affect: Mood normal.             Palliative Assessment/Data: 50%    Total Time 50 minutes  Greater than 50%  of this time was spent counseling and coordinating care related to the above assessment and plan.  Thank you for allowing the Palliative Medicine Team to assist in the care of this patient.  Yolanda Ilsa Iha, FNP-BC Palliative Medicine Team Team Phone # [phone]

## 2022-05-15 NOTE — Progress Notes (Signed)
[facility] Progress Note     Assessment/ Plan:   AKI - b/l creat 0.7 on admission. Creat up to 3.5 after about 10 days of IV lasix. No nephrotoxins, contrast or ACEi/ ARB. On exam has livedo pattern in LE's c/w hypoperfusion. Pt is down 10-12 kg from admission. BP's are down a bit, UA is negative, renal US today w/o obstruction. Volume depletion certainly played a part; doubt ATN as UOP has been good and doubt any acute GN or AIN with normal UA/ no wbcs/ rbc's.  - continue holding diuretics but will hold the NS at this time. Do not want her to get into trouble overnight and she is starting to eat today. - Will follow closely with you.  AHRF - w/ multifocal / aspiration PNA. SP abx.  Volume depletion - as above Morbid obesity NASH/ cirrhosis  Subjective:   Feeling better in that she has a little more of an appetite. Denies f/c but has a little shortness of breath.   Objective:   BP (!) 99/49 (BP Location: Left Arm)   Pulse (!) 58   Temp (!) 97.5 F (36.4 C) (Oral)   Resp 18   Ht 5' 4"  (1.626 m)   Wt 106.8 kg   LMP 07/11/2007 (Approximate)   SpO2 97%   BMI 40.42 kg/m   Intake/Output Summary (Last 24 hours) at 05/15/2022 1705 Last data filed at 05/15/2022 1412 Gross per 24 hour  Intake 2620.81 ml  Output 630 ml  Net 1990.81 ml   Weight change: 1.067 kg  Physical Exam: Gen alert, no distress, obese WF no distress, nasal O2 No rash, cyanosis or gangrene No jvd or bruits Chest clear bilat to bases, no rales/ wheezing RRR no MRG Abd soft ntnd no mass or ascites +bs, obese GU defer Ext no LE or UE edema, diffuse purplish discoloration w/ livedo pattern on bilat LE"s below the knees Neuro is alert, Ox 3 , nf, no asterixis  Imaging: DG CHEST PORT 1 VIEW  Result Date: 05/14/2022 CLINICAL DATA:  Renal dysfunction, difficulty breathing EXAM: PORTABLE CHEST 1 VIEW COMPARISON:  Previous studies including the examination of 05/05/2022 FINDINGS: Transverse  diameter of heart is increased. There is interval decrease in pulmonary vascular congestion and improvement in aeration of both lungs. There is interval decrease in bilateral pleural effusions. Bilateral small residual pleural effusions are seen, more so on the right side. There is no pneumothorax. There is interval removal of central venous catheter. IMPRESSION: Cardiomegaly. There is interval decrease in pulmonary vascular congestion and pulmonary edema. There is interval decrease in bilateral pleural effusions with small bilateral residual effusions. Electronically Signed   By: Elmer Picker M.D.   On: 05/14/2022 12:03   US RENAL  Result Date: 05/14/2022 CLINICAL DATA:  Renal dysfunction EXAM: RENAL / URINARY TRACT ULTRASOUND COMPLETE COMPARISON:  Sonogram done on 03/01/2022, CT done on 04/24/2022 FINDINGS: Right Kidney: Renal measurements: 12.8 x 6.8 x 6.6 cm = volume: 300.1 mL. There is no hydronephrosis. There is increased cortical echogenicity. Left Kidney: Renal measurements: 12.3 x 6.9 x 6.5 cm = volume: 287.7 mL. There is no hydronephrosis. There is increased cortical echogenicity. Bladder: Not sonographically visualized. Other: None. IMPRESSION: There is no hydronephrosis. There is increased cortical echogenicity suggesting medical renal disease. Electronically Signed   By: Elmer Picker M.D.   On: 05/14/2022 12:00    Labs: BMET Recent Labs  Lab 05/09/22 0054 05/10/22 0121 05/11/22 0055 05/12/22 0052 05/13/22 0057 05/14/22 0115 05/15/22 9702  NA 139 134* 134* 132* 134* 130* 134*  K 3.7 4.4 5.2* 5.0 5.5* 5.1 5.0  CL 91* 93* 93* 90* 93* 90* 99  CO2 37* 30 32 29 27 26 27   GLUCOSE 195* 92 100* 90 86 76 84  BUN 28* 36* 39* 39* 48* 60* 57*  CREATININE 1.21* 1.72* 1.94* 2.28* 2.94* 3.59* 3.45*  CALCIUM 10.2 10.3 11.0* 11.4* 11.7* 11.1* 10.1  PHOS  --   --   --   --  5.7*  --  4.7*   CBC Recent Labs  Lab 05/09/22 0054 05/10/22 0121 05/12/22 0052 05/15/22 0045  WBC  10.1 12.0* 12.6* 11.2*  HGB 12.5 12.8 13.7 11.9*  HCT 38.1 38.2 41.2 35.6*  MCV 98.4 96.5 96.3 95.7  PLT 159 177 169 PLATELET CLUMPS NOTED ON SMEAR, UNABLE TO ESTIMATE    Medications:     amiodarone  200 mg Oral Daily   apixaban  5 mg Oral BID   budesonide (PULMICORT) nebulizer solution  0.5 mg Nebulization BID   DULoxetine  60 mg Oral Daily   feeding supplement  237 mL Oral BID BM   folic acid  1 mg Oral Daily   levothyroxine  75 mcg Oral Q0600   metoprolol tartrate  12.5 mg Oral BID   multivitamin with minerals  1 tablet Oral Daily   rifaximin  550 mg Oral BID   rosuvastatin  10 mg Oral Daily      Otelia Santee, MD 05/15/2022, 5:05 PM

## 2022-05-15 NOTE — Progress Notes (Signed)
Progress Note   Patient: Yolanda [name] DHR:[id number] DOB: [birth year] DOA: 04/28/2022     17 DOS: the patient was seen and examined on 05/15/2022   Brief hospital course: Mrs. Longnecker was admitted to the hospital with the working diagnosis of decompensated heart failure.   66 yo female with the past medical history of NASH/ cirrhosis, paroxysmal atrial fibrillation, heart failure, COPD and obesity who presented with dyspnea. She was discharged from the hospital (AP) one day prior, for heart failure, pneumonia and urinary tract infection, she was in the hospital for 8 days. Patient at home was noted to have worsening oxygen desaturation down to the 60's and increased work of breathing that prompted her transfer to the ED. At the time she reached the ED she was in respiratory distress and was placed on non invasive mechanical ventilation. Her blood pressure was 118/52, HR 82, RR 31 and 02 saturation 96%, lungs with decreased breath sounds, diffuse wheezing and rales more right than left, increased work of breathing, heart with S1 and S2 present and rhythmic, abdomen with no distention, no lower extremity edema, patient was lethargic  but non focal.   ABG 7.34/ 42/ 73/ 24/ 93% Na 132, K 4,0 CL 104, bicarbonate 22, glucose 98, bun 16, cr 0.72  AST 42 ALT 18 BNP 103 Wbc 11.8 hgb 9.8 plt 222  Chest radiograph with cardiomegaly, bilateral hilar vascular congestion, bilateral interstitial infiltrates with more dense at the right lower lobe.   EKG 86 bpm, normal axis, normal qtc, atrial fibrillation rhythm, no significant ST segment or T wave changes.   Patient continue with high oxygen requirements and using Bipap intermittently.   10/22 worsening respiratory failure and required intubation.  Placed on broad spectrum antibiotic therapy with Zosyn.  10/25 liberated from invasive mechanical ventilation, patient with encephalopathy.  10/26 atrial fibrillation with RVR, required amiodarone  infusion.  10/28 transfer [facility].  10/29 continue with uncontrolled atrial fibrillation.  11/02 patient converted to sinus rhythm.  11/03 clinically improving but renal function still not stable.  11/04 trial of IV fluids bolus for worsening renal function.  11/05 continue worsening renal function, suspected ATN. Started on IV fluids, developed atrial fibrillation and placed on amiodarone drip. 11/06 Patient placed on IV fluids with improvement in renal function, continue with orthostatic symptoms.  Converted to sinus rhythm.   Assessment and Plan: * Acute on chronic diastolic CHF (congestive heart failure) (HCC) Echocardiogram with preserved LV systolic function EF 70 to 46%, RV systolic function preserved, no significant valvular disease.   10/20 CT chest with bilateral ground glass opacities, predominant central location. Small bilateral pleural effusions, more right than left. New small pericardial effusion.   Documented urine output 330 cc Continue to have orthostatic symptoms.  Blood pressure systolic 803 mmHg.   Continue with metoprolol, No RAS inhibition due to worsening renal function.   Acute hypoxemic respiratory failure due to cardiogenic pulmonary edema.  Patient has completed antibiotic therapy for recurrent pneumonia, (present on admission)  Dysphagia, continue with aspiration precautions,  Re order esophagogram Patient currently on a regular diet.   Improving leukocytosis, continue to hold antibiotic therapy.   Essential hypertension Patient with metoprolol Positive hypovolemia, continue IV fluids with caution.   Paroxysmal atrial fibrillation with RVR ([place]) Patient converted back to sinus rhythm, will transition to oral amiodarone. Telemetry with sinus bradycardia in th 60 bpm range.   AKI (acute kidney injury) ([place]) Hyponatremia, hypokalemia/ hyperkalemia, hypomagnesemia. Suspected acute tubular necrosis.   Renal  US with no hydronephrosis, increased  echogenicity at the cortical region suggesting medical renal diease.   Patient had 1000 ml IV bolus, and now on continuous infusion of isotonic saline.  Renal function with improvement in serum cr at 3,45 with K at 5,0 and serum bicarbonate at 27 Na is 134 and P 4,7  Follow up renal function in am, avoid hypotension and nephrotoxic medications,    Liver cirrhosis secondary to NASH [facility]) Patient with no clinical signs of ascites, no signs of decompensated liver failure.  Elevated liver enzymes due to congested hepatopathy.   Continue with rifaximin.    Acute metabolic encephalopathy Her encephalopathy has resolved.  Continue with Pt and OT.   GERD (gastroesophageal reflux disease) Continue antiacid therapy.   Hypothyroidism Continue with levothyroxine   Type 2 diabetes mellitus with hyperlipidemia (HCC) Hypoglycemia  Fating glucose is 84 this am.  No insulin therapy Continue to encourage po intake.   Continue with statin therapy.   Class 3 obesity (HCC) Calculated BMI is 44.3 Patient with very poor physical functional status, uses a walker for ambulation.   Depression and anxiety,  Continue with daily duloxetine.  Discontinue alprazolam.         Subjective: Patient with poor oral intake, "food with no taste", she continue to have dizziness when standing and getting out of the bed.   Physical Exam: Vitals:   05/15/22 0500 05/15/22 0507 05/15/22 0821 05/15/22 1103  BP:  (!) 106/93  (!) 104/49  Pulse:  (!) 58  (!) 58  Resp:  17  18  Temp:  (!) 97.5 F (36.4 C)  (!) 97.5 F (36.4 C)  TempSrc:  Oral  Oral  SpO2:  98% 96% 97%  Weight: 106.8 kg     Height:       Neurology awake and alert ENT with mild pallor Cardiovascular with S1 and S2 present and rhythmic with no gallops, rubs or murmurs Respiratory with no rales or wheezing Abdomen with no distention  No lower extremity edema  Data Reviewed:    Family Communication: I spoke with patient's daughter  at the bedside, we talked in detail about patient's condition, plan of care and prognosis and all questions were addressed.   Disposition: Status is: Inpatient Remains inpatient appropriate because: renal failure   Planned Discharge Destination: Home  Author: Tawni Millers, MD 05/15/2022 12:49 PM  For on call review [email or web address].

## 2022-05-15 NOTE — Progress Notes (Signed)
Inpatient Rehabilitation Admissions Coordinator   Patient currently not at a level to pursue Cir admit due to tolerance issues , nor is she medically ready. I will follow at a distance.  Danne Baxter, RN, MSN Rehab Admissions Coordinator [phone] 05/15/2022 4:49 PM

## 2022-05-15 NOTE — Progress Notes (Signed)
Physical Therapy Treatment Patient Details Name: Yolanda [name] MRN: [id number] DOB: Oct 05, 1955 Today's Date: 05/15/2022   History of Present Illness Pt is 66 y/o F admitted to [facility] on 04/28/22 wih SOB, hypoxia, acute on chronic heart failure, Afib with RVR. Worsening hypoxic respiratory failure 10/22 with transfer to ICU; ETT 10/22-10/25. Of note, recent hosptial admission 10/11-10/19 with acute encephalopathy. PMH of anxiety, depression, fibroylagia, GERD, HTN, hypothyroidism, obesity, RA, PE, liver cirrhosis.    PT Comments    The pt was agreeable to session, eager to attempt OOB mobility. However, progress was significantly limited by orthostatic hypotension (see below). The pt was symptomatic in sitting, and therefore unable to progress to standing activity or gait progression this session despite attempts at seated exercises. Will continue to benefit from skilled PT acutely to progress activity tolerance and strength as family is hoping for return home with Palliative or Hospice services at home.   VITALS:  - supine in bed- BP: 85/62 (71);  - sitting EOB - BP: 84/45 (54);  - sitting EOB 2 min - BP: 84/48 (57); - supine - BP: 89/48 (62);  - supine end of session - 99/49 (65)    Recommendations for follow up therapy are one component of a multi-disciplinary discharge planning process, led by the attending physician.  Recommendations may be updated based on patient status, additional functional criteria and insurance authorization.  Follow Up Recommendations  Other (comment) (pending palliative vs. hospice decision. Pt would benefit from max home health services) Can patient physically be transported by private vehicle: Yes   Assistance Recommended at Discharge Frequent or constant Supervision/Assistance  Patient can return home with the following A lot of help with walking and/or transfers;A lot of help with bathing/dressing/bathroom;Assistance with cooking/housework;Direct  supervision/assist for medications management;Direct supervision/assist for financial management;Assist for transportation;Help with stairs or ramp for entrance   Equipment Recommendations  BSC/3in1;Wheelchair (measurements PT);Wheelchair cushion (measurements PT)    Recommendations for Other Services       Precautions / Restrictions Precautions Precautions: Fall Precaution Comments: watch SpO2, on 3L this session Restrictions Weight Bearing Restrictions: No     Mobility  Bed Mobility Overal bed mobility: Needs Assistance Bed Mobility: Supine to Sit, Sit to Supine     Supine to sit: Min guard Sit to supine: Min guard   General bed mobility comments: cues needed for sequencing and attention. pt completing with increased time    Transfers                   General transfer comment: deferred due to drop in BP with pt symptomatic while sitting EOB - 84/45 (54) and 84/48 (57)         Balance Overall balance assessment: Needs assistance Sitting-balance support: No upper extremity supported, Feet supported Sitting balance-Leahy Scale: Fair                                      Cognition Arousal/Alertness: Awake/alert Behavior During Therapy: WFL for tasks assessed/performed Overall Cognitive Status: Impaired/Different from baseline Area of Impairment: Attention, Memory, Following commands, Safety/judgement, Awareness, Problem solving                 Orientation Level: Disoriented to, Situation Current Attention Level: Selective Memory: Decreased recall of precautions, Decreased short-term memory Following Commands: Follows one step commands with increased time Safety/Judgement: Decreased awareness of safety, Decreased awareness of deficits Awareness: Emergent Problem Solving:  Slow processing, Decreased initiation, Requires verbal cues General Comments: slowed processing and easily distracted, limited insight.        Exercises General  Exercises - Lower Extremity Hip Flexion/Marching: AROM, Both, 10 reps, Seated    General Comments General comments (skin integrity, edema, etc.): pt with soft BP at start of session and dropped to 80s/40s with MAP in 50s with transition to sitting EOB. Pt appears symptomatic with slight increase in cog deficits with continued sitting, therefore returned to supine.      Pertinent Vitals/Pain Pain Assessment Pain Assessment: No/denies pain Faces Pain Scale: No hurt Pain Intervention(s): Monitored during session     PT Goals (current goals can now be found in the care plan section) Acute Rehab PT Goals Patient Stated Goal: to feel better and go home PT Goal Formulation: With patient Time For Goal Achievement: 05/22/22 Potential to Achieve Goals: Good Progress towards PT goals: Not progressing toward goals - comment (orthostatic)    Frequency    Min 3X/week      PT Plan Discharge plan needs to be updated       AM-PAC PT "6 Clicks" Mobility   Outcome Measure  Help needed turning from your back to your side while in a flat bed without using bedrails?: A Little Help needed moving from lying on your back to sitting on the side of a flat bed without using bedrails?: A Little Help needed moving to and from a bed to a chair (including a wheelchair)?: A Lot Help needed standing up from a chair using your arms (e.g., wheelchair or bedside chair)?: A Lot Help needed to walk in hospital room?: A Lot Help needed climbing 3-5 steps with a railing? : Total 6 Click Score: 13    End of Session Equipment Utilized During Treatment: Oxygen Activity Tolerance: Treatment limited secondary to medical complications (Comment) (orthostatic) Patient left: in bed;with call bell/phone within reach;with bed alarm set Nurse Communication: Mobility status (need to use bathroom and pt orthostatic) PT Visit Diagnosis: Muscle weakness (generalized) (M62.81);Other abnormalities of gait and mobility  (R26.89)     Time: 4270-6237 PT Time Calculation (min) (ACUTE ONLY): 29 min  Charges:  $Therapeutic Activity: 23-37 mins                     [place] Carbo, PT, DPT   Acute Rehabilitation Department   Sandra Cockayne 05/15/2022, 2:20 PM

## 2022-05-16 ENCOUNTER — Inpatient Hospital Stay (HOSPITAL_COMMUNITY): Payer: Medicare Other

## 2022-05-16 DIAGNOSIS — N179 Acute kidney failure, unspecified: Secondary | ICD-10-CM | POA: Diagnosis not present

## 2022-05-16 DIAGNOSIS — K219 Gastro-esophageal reflux disease without esophagitis: Secondary | ICD-10-CM | POA: Diagnosis not present

## 2022-05-16 DIAGNOSIS — I5033 Acute on chronic diastolic (congestive) heart failure: Secondary | ICD-10-CM | POA: Diagnosis not present

## 2022-05-16 DIAGNOSIS — E440 Moderate protein-calorie malnutrition: Secondary | ICD-10-CM

## 2022-05-16 DIAGNOSIS — I480 Paroxysmal atrial fibrillation: Secondary | ICD-10-CM | POA: Diagnosis not present

## 2022-05-16 DIAGNOSIS — K7581 Nonalcoholic steatohepatitis (NASH): Secondary | ICD-10-CM | POA: Diagnosis not present

## 2022-05-16 LAB — RENAL FUNCTION PANEL
Albumin: 1.7 g/dL — ABNORMAL LOW (ref 3.5–5.0)
Anion gap: 6 (ref 5–15)
BUN: 45 mg/dL — ABNORMAL HIGH (ref 8–23)
CO2: 27 mmol/L (ref 22–32)
Calcium: 10.1 mg/dL (ref 8.9–10.3)
Chloride: 102 mmol/L (ref 98–111)
Creatinine, Ser: 2.97 mg/dL — ABNORMAL HIGH (ref 0.44–1.00)
GFR, Estimated: 17 mL/min — ABNORMAL LOW (ref >60)
Glucose, Bld: 95 mg/dL (ref 70–99)
Phosphorus: 4.1 mg/dL (ref 2.5–4.6)
Potassium: 3.9 mmol/L (ref 3.5–5.1)
Sodium: 135 mmol/L (ref 135–145)

## 2022-05-16 MED ORDER — LACTULOSE 10 GM/15ML PO SOLN
20.0000 g | Freq: Every day | ORAL | Status: DC
  Administered 2022-05-16 – 2022-05-19 (×4): 20 g via ORAL
  Filled 2022-05-16 (×4): qty 30

## 2022-05-16 NOTE — Progress Notes (Addendum)
Nutrition Follow-up  DOCUMENTATION CODES:   Non-severe (moderate) malnutrition in context of chronic illness  INTERVENTION:  Continue current diet as ordered Ensure Enlive po BID, each supplement provides 350 kcal and 20 grams of protein. Snacks TID between meals to increase frequency of PO intake Continue MVI with minerals daily  NUTRITION DIAGNOSIS:   Moderate Malnutrition related to acute illness, chronic illness (hepatic encephalopathy, respiratory distress, NASH) as evidenced by mild muscle depletion, percent weight loss (12% weight loss within 3 months).  Ongoing  GOAL:   Patient will meet greater than or equal to 90% of their needs  Goal unmet, addressing via supplements and meals  MONITOR:   Vent status, TF tolerance, Labs, I & O's  REASON FOR ASSESSMENT:   Consult Enteral/tube feeding initiation and management  ASSESSMENT:   66 yo female admitted with hypoxia and worsening SOB. PMH includes NASH cirrhosis, CHF, PE, A fib, OSA, HTN, chronic respiratory failure on 3 L oxygen via [place] at baseline, GERD, depression, hypothyroidism. Recent admission at [facility] for new onset A fib and PNA.  Renal function improving. TOC working on discharge plans for SNF.   Pt with other providers at time of visit. Diet advanced to regular on 11/5. Noted meal completions declined within the last 2 days. Pt noted to have received all but 3 Ensure supplements since ordered in 10/31. Uncertain how many have been consumed.  Will add snacks between meals to provide more frequent meal times.   Meal completions: 11/4: 100% lunch 11/6-11/7: 0% x 3 recorded meals   Medications: folvite, synthroid, MVI  Labs: BUN 45, Cr 2.97, GFR 17, CBG's 69, 85, 77 x24 hours  Diet Order:   Diet Order             Diet regular Room service appropriate? Yes; Fluid consistency: Thin  Diet effective now                   EDUCATION NEEDS:   No education needs have been identified at this time  Skin:   Skin Assessment: Reviewed RN Assessment  Last BM:  11/5  Height:   Ht Readings from Last 1 Encounters:  05/03/22 5' 4"  (1.626 m)    Weight:   Wt Readings from Last 1 Encounters:  05/16/22 108.6 kg    Ideal Body Weight:  54.5 kg  BMI:  Body mass index is 41.1 kg/m.  Estimated Nutritional Needs:   Kcal:  1650-1850  Protein:  110-135 gm  Fluid:  1.7-1.8 L  Clayborne Dana, RDN, LDN Clinical Nutrition

## 2022-05-16 NOTE — Progress Notes (Signed)
                                                     Palliative Care Progress Note, Assessment & Plan   Patient Name: Yolanda [name]       Date: 05/16/2022 DOB: 06-22-1956  Age: 66 y.o. MRN#: [id number] Attending Physician: Tawni Millers Primary Care Physician: Celene Squibb, MD Admit Date: 04/28/2022  Reason for Consultation/Follow-up: Establishing goals of care  Subjective: Patient is lying in bed in no apparent distress.  She is sleeping but is easily arousable.  She acknowledges my presence and is able to make her wishes known.  Nasal cannula in place.  No family or friends at bedside currently.  Summary of counseling/coordination of care: After reviewing the patient's chart and assessing the patient at bedside, I counseled with patient's dayshift RN Dellis Filbert.  He states patient continues to have intermittent confusion but overall is alert and oriented.   I discussed plan of care with patient.  She shares she has seen her doctor this morning who shares that she is "doing better".  We reviewed that her kidney function (specifically creatinine) has improved since yesterday.  Patient is proud to report that she has had a few bites of fruit and had lots of water to drink this morning.  She denies nausea, vomiting, or pain at this time.  I attempted to elicit goals important to the patient at this time.  Patient remains hopeful that she will continue to get stronger.  We reviewed importance of functional and nutritional status improving.  We discussed that when patient is medically stable then more specific discharge planning will occur.  TOC following closely.  PMT will remain available to patient and family throughout her hospitalization.  Physical Exam Vitals reviewed.  Constitutional:      General: She is not in acute distress.     Appearance: She is obese.  HENT:     Head: Normocephalic.     Mouth/Throat:     Mouth: Mucous membranes are moist.  Eyes:     Pupils: Pupils are equal, round, and reactive to light.  Cardiovascular:     Rate and Rhythm: Normal rate.     Pulses: Normal pulses.  Pulmonary:     Effort: Pulmonary effort is normal.     Comments: [place] in place Abdominal:     Palpations: Abdomen is soft.  Musculoskeletal:     Comments: Generalized weakness  Skin:    General: Skin is warm and dry.  Neurological:     Mental Status: She is alert and oriented to person, place, and time.  Psychiatric:        Mood and Affect: Mood normal.        Behavior: Behavior normal.        Thought Content: Thought content normal.        Judgment: Judgment normal.             Palliative Assessment/Data: 40%    Total Time 25 minutes  Greater than 50%  of this time was spent counseling and coordinating care related to the above assessment and plan.  Thank you for allowing the Palliative Medicine Team to assist in the care of this patient.  [place] Ilsa Iha, FNP-BC Palliative Medicine Team Team Phone # [phone]

## 2022-05-16 NOTE — Assessment & Plan Note (Signed)
Continue nutrition suppuration

## 2022-05-16 NOTE — Progress Notes (Signed)
       RE:  Yolanda [name]     Date of Birth:  1956-02-07   Date:   05/16/2022      To Whom It May Concern:  Please be advised that the above-named patient will require a short-term nursing home stay - anticipated 30 days or less for rehabilitation and strengthening.  The plan is for return home.

## 2022-05-16 NOTE — Progress Notes (Signed)
This chaplain is present for F/U spiritual care with the Pt. The Pt. is awake and conversational during the chaplain's visit. The Pt. accepted the chaplain's prompt to fix her nasal cannula. The Pt. shares no concerns for today with the exception of a sore in the front of her mouth in the gum area. The Pt. is wetting the area with her finger throughout the visit.  The chaplain listens reflectively as the Pt. shares the news of d/c in the near future. The Pt. expresses acceptance without any concerns in the statement. The Pt. storytelling includes her daughters and adds a place of peace in the Pt. memories of Sunday afternoon drives without a destination. The Pt. is appreciative of the chaplain visit and takes a moment to connect the chaplain with a gift from her granddaughter.  This chaplain is available for F/U spiritual care as needed.  Chaplain Sallyanne Kuster [phone]

## 2022-05-16 NOTE — Progress Notes (Addendum)
Progress Note   Patient: Yolanda [name] TGG:[id number] DOB: 01-29-1956 DOA: 04/28/2022     18 DOS: the patient was seen and examined on 05/16/2022   Brief hospital course: Mrs. Group was admitted to the hospital with the working diagnosis of decompensated heart failure.   66 yo female with the past medical history of NASH/ cirrhosis, paroxysmal atrial fibrillation, heart failure, COPD and obesity who presented with dyspnea. She was discharged from the hospital (AP) one day prior, for heart failure, pneumonia and urinary tract infection, she was in the hospital for 8 days. Patient at home was noted to have worsening oxygen desaturation down to the 60's and increased work of breathing that prompted her transfer to the ED. At the time she reached the ED she was in respiratory distress and was placed on non invasive mechanical ventilation. Her blood pressure was 118/52, HR 82, RR 31 and 02 saturation 96%, lungs with decreased breath sounds, diffuse wheezing and rales more right than left, increased work of breathing, heart with S1 and S2 present and rhythmic, abdomen with no distention, no lower extremity edema, patient was lethargic  but non focal.   ABG 7.34/ 42/ 73/ 24/ 93% Na 132, K 4,0 CL 104, bicarbonate 22, glucose 98, bun 16, cr 0.72  AST 42 ALT 18 BNP 103 Wbc 11.8 hgb 9.8 plt 222  Chest radiograph with cardiomegaly, bilateral hilar vascular congestion, bilateral interstitial infiltrates with more dense at the right lower lobe.   EKG 86 bpm, normal axis, normal qtc, atrial fibrillation rhythm, no significant ST segment or T wave changes.   Patient continue with high oxygen requirements and using Bipap intermittently.   10/22 worsening respiratory failure and required intubation.  Placed on broad spectrum antibiotic therapy with Zosyn.  10/25 liberated from invasive mechanical ventilation, patient with encephalopathy.  10/26 atrial fibrillation with RVR, required amiodarone  infusion.  10/28 transfer back to [facility].  10/29 continue with uncontrolled atrial fibrillation.  11/02 patient converted to sinus rhythm.  11/03 clinically improving but renal function still not stable.  11/04 trial of IV fluids bolus for worsening renal function.  11/05 continue worsening renal function, suspected ATN. Started on IV fluids, developed atrial fibrillation and placed on amiodarone drip. 11/06 Patient placed on IV fluids with improvement in renal function, continue with orthostatic symptoms.  Converted to sinus rhythm.  11/07 renal function is improving. Start discharge planning.   Assessment and Plan: * Acute on chronic diastolic CHF (congestive heart failure) (HCC) Echocardiogram with preserved LV systolic function EF 70 to 70%, RV systolic function preserved, no significant valvular disease.   10/20 CT chest with bilateral ground glass opacities, predominant central location. Small bilateral pleural effusions, more right than left. New small pericardial effusion.   Orthostatic symptoms have improved.  Systolic blood pressure 350 and 90 over 60 and 40   Continue with metoprolol, No RAS inhibition due to worsening renal function.  Acute hypoxemic respiratory failure due to cardiogenic pulmonary edema.  Patient has completed antibiotic therapy for recurrent pneumonia, (present on admission)  Patient currently on a regular diet, no vomiting.  Dysphagia esophagogram with mild narrowing near the GE junction, not able to further assess. Repeat esophagogram today Improving leukocytosis, continue to hold antibiotic therapy.   Essential hypertension Patient with metoprolol Agree in holding further IV fluids for now, continue close blood pressure and renal function monitoring.   Paroxysmal atrial fibrillation with RVR (HCC) Continue on sinus rhythm rate in th 60's telemetry personally reviewed.  Continue  amiodarone and metoprolol Continue anticoagulation with apixban for  anticoagulation,   AKI (acute kidney injury) ([place]) Hyponatremia, hypokalemia/ hyperkalemia, hypomagnesemia. Suspected acute tubular necrosis, now recovering.   Renal US with no hydronephrosis, increased echogenicity at the cortical region suggesting medical renal diease.   With volume resuscitation her renal function has been improving, today her serum cr is down to 2,97 with K at 3,9 and serum bicarbonate at 27. Plan to continue close follow up on renal function and electrolytes. Avoid hypotension and nephrotoxic medications Hold IV fluids for now.    Liver cirrhosis secondary to NASH [facility]) Patient with no clinical signs of ascites, no signs of decompensated liver failure.  Elevated liver enzymes due to congested hepatopathy.  Mild elevated ammonia but dose not correlate with patient's mentation, no clinical signs of hepatic encephalopathy.   Continue with lactulose, ok to discontinue rifaximin   Acute metabolic encephalopathy Her encephalopathy has resolved.  Continue with Pt and OT.   GERD (gastroesophageal reflux disease) Continue antiacid therapy.   Hypothyroidism Continue with levothyroxine   Type 2 diabetes mellitus with hyperlipidemia ([place]) Hypoglycemia  Patient with fasting glucose 95 mg/dl.  No insulin therapy Continue to encourage po intake.   Continue with statin therapy.   Class 3 obesity (HCC) Calculated BMI is 44.3 Patient with very poor physical functional status, uses a walker for ambulation.   Depression and anxiety,  Continue with daily duloxetine.  Discontinue alprazolam.   Protein-calorie malnutrition, moderate (HCC) Continue nutrition suppuration         Subjective: patient today is feeling better, no significant dysphagia or vomiting.   Physical Exam: Vitals:   05/16/22 0038 05/16/22 0549 05/16/22 0751 05/16/22 0843  BP: (!) 95/44 (!) 90/47 (!) 97/44   Pulse: 63 66 72   Resp: 18  18   Temp: 97.7 F (36.5 C) 97.9 F (36.6 C) 98.1  F (36.7 C)   TempSrc: Oral Oral Oral   SpO2: 100% 100% 100% 97%  Weight: 108.6 kg     Height:       Neurology awake and alert ENT with mild pallor Cardiovascular with S1 and S2 present and rhythmic with no gallops, rubs or murmurs Respiratory with no rales or wheezing Abdomen with no distention  No lower extremity edema  Data Reviewed:    Family Communication: no family at the bedside   Disposition: Status is: Inpatient Remains inpatient appropriate because: heart failure   Planned Discharge Destination:  to be determined     Author: Tawni Millers, MD 05/16/2022 10:12 AM  For on call review [email or web address].

## 2022-05-16 NOTE — TOC Progression Note (Addendum)
Transition of Care [facility]) - Progression Note    Patient Details  Name: Yolanda [name] MRN: [id number] Date of Birth: 04/28/1956  Transition of Care [facility]) CM/SW [place], [place] Phone Number: 05/16/2022, 11:32 AM  Clinical Narrative:     CSW notified by attending that pt's family are wanting STR at SNF at [place]. CSW called pt's daughter, Yolanda [name], and confirmed this is their desire. Pt lives in [place]. CSW explained SNF workup process; daughter is agreeable for referrals in [place] and [place] areas. CSW explained how daughter can access medicare star ratings through [email or web address].[email or web address] Daughter expresses concern about pt not being ready to DC tomorrow. CSW explained attending will continue to assess for readiness prior to [place]. CSW completed fl2 and faxed bed requests in hub. CSW will follow up with pt daughter with bed offers.   1140: CSW received voicemail from daughter, Yolanda [name], explaining that her and Yolanda [name] reviewed SNFs and have preference for [place] called [facility]; they will review referral.   1330: Notified that [facility] can likely take if MD plans to switch Xifaxan to Lactulose. Confirmed this with MD and left message back with [facility]. Left message notifying and awaiting confirmation of bed offer. CSW called and updated daughter Yolanda [name]. Pt referred to financial counseling as well.   1400: CSW received confirmation from [facility] they can offer bed for pt.   Expected Discharge Plan: [place] Barriers to Discharge: Continued Medical Work up  Expected Discharge Plan and Services Expected Discharge Plan: [place] In-house Referral: Clinical Social Work Discharge Planning Services: CM Consult Post Acute Care Choice: Home Health, Resumption of Svcs/PTA Provider Living arrangements for the past 2 months: Single Family Home                   DME Agency: NA       HH Arranged: RN, Disease Management, Nurse's  Aide, Social Work [employer] Agency: Microbiologist ([place]) Date [place]: 05/04/22 Time [place]: 1626 Representative spoke with at [place]: [place] ([place]) Interventions    Readmission Risk Interventions    05/04/2022    4:24 PM 04/25/2022    1:27 PM 09/22/2021   11:13 AM  Readmission Risk Prevention Plan  Post Dischage Appt   Complete  Medication Screening   Complete  Transportation Screening Complete Complete Complete  HRI or [place]  Complete   Social Work Consult for [place] Planning/Counseling  Complete   Palliative Care Screening  Not Applicable   Medication Review Press photographer) Complete Complete   HRI or [place] Complete    SW Recovery Care/Counseling Consult Complete    [place] Not Applicable

## 2022-05-16 NOTE — NC FL2 (Addendum)
[place] MEDICAID FL2 LEVEL OF CARE SCREENING TOOL     IDENTIFICATION  Patient Name: Yolanda [name] Birthdate: May 31, 1956 Sex: female Admission Date (Current Location): 04/28/2022  [facility] and [place] Number:  Herbalist and Address:  [facility] [place], [place] [address] [place], [place] [address]      Provider Number: [id number]  Attending Physician Name and Address:  Tawni Millers,*  Relative Name and Phone Number:  [name] (Daughter)  [phone] (Mobile)    Current Level of Care: Hospital Recommended Level of Care: [place] Prior Approval Number:    Date Approved/Denied:   PASRR Number: [id number]  Discharge Plan: SNF    Current Diagnoses: Patient Active Problem List   Diagnosis Date Noted   Protein-calorie malnutrition, moderate ([place]) 05/16/2022   Typical atrial flutter ([place])    Pulmonary edema 05/03/2022   Acute respiratory failure with hypoxia ([place]) 04/30/2022   AKI (acute kidney injury) ([place]) 04/29/2022   CHF (congestive heart failure) ([place]) 04/28/2022   Paroxysmal atrial fibrillation with RVR ([place]) 04/25/2022   Ileus ([place]) 04/25/2022   Malnutrition of moderate degree 04/24/2022   Acute on chronic diastolic CHF (congestive heart failure) ([place]) 22/29/7989   Acute metabolic encephalopathy 21/19/4174   Urinary urgency 12/29/2021   Seizure ([place]) 12/07/2021   Generalized weakness 12/07/2021   Hypoalbuminemia due to protein-calorie malnutrition ([place]) 12/07/2021   Hyperammonemia /Hepatic encephalopathy 12/07/2021   UTI (urinary tract infection) 12/07/2021   Elevated MCV 12/07/2021   Essential tremor 12/07/2021   Psoriatic arthritis ([place]) 12/07/2021   Asthma, chronic 12/07/2021   Acute hepatic encephalopathy/nNASH Liver Cirrhosis 12/07/2021   Chronic respiratory failure with hypoxia ([place]) 11/27/2021   Pulmonary infiltrates 11/27/2021   Upper airway cough syndrome vs cough variant asthma  11/27/2021   Class 3 obesity ([place]) 11/02/2021   Pulmonary alveolar hemorrhage 09/20/2021   Multifocal pneumonia 09/20/2021   Hyperglycemia 09/20/2021   Oral candida 01/30/2020   Gastric nodule    Personal history of colonic polyps 01/01/2019   Liver cirrhosis secondary to NASH ([place]) 01/22/2018   Dysphagia 04/19/2017   Abdominal pain, epigastric 04/19/2017   Abdominal pain 12/08/2014   Postmenopausal bleeding 11/18/2014   Closed fracture of distal clavicle 04/17/2014   Muscle weakness (generalized) 03/26/2014   Pain in joint, shoulder region 03/26/2014   Decreased range of motion of right shoulder 03/26/2014   Right clavicle fracture 03/19/2014   Fatty liver 03/17/2014   Rectal bleeding 03/17/2014   Dyspepsia 03/11/2014   Obesity, Class III, BMI 40-49.9 (morbid obesity) ([place]) 09/11/2012   Hypothyroidism 09/11/2012   ADHD (attention deficit hyperactivity disorder) 09/11/2012   Anxiety 09/11/2012   GERD (gastroesophageal reflux disease) 12/21/2009   IRRITABLE BOWEL SYNDROME 04/22/2009   Type 2 diabetes mellitus with hyperlipidemia ([place]) 07/09/2008   Anxiety and depression 07/09/2008   Essential hypertension 07/09/2008   Gastritis and gastroduodenitis 07/09/2008   Sleep apnea 07/09/2008    Orientation RESPIRATION BLADDER Height & Weight     Self, Situation  O2 (3L [place]) Continent Weight: 239 lb 6.7 oz (108.6 kg) Height:  5' 4"  (162.6 cm)  BEHAVIORAL SYMPTOMS/MOOD NEUROLOGICAL BOWEL NUTRITION STATUS      Continent Diet (see d/c summary)  AMBULATORY STATUS COMMUNICATION OF NEEDS Skin   Extensive Assist   Normal                       Personal Care Assistance Level of Assistance  Bathing, Feeding, Dressing Bathing Assistance: Limited assistance Feeding  assistance: Independent Dressing Assistance: Limited assistance     Functional Limitations Info  Sight, Hearing, Speech Sight Info: Impaired Hearing Info: Impaired Speech Info: Adequate    SPECIAL CARE FACTORS  FREQUENCY  OT (By licensed OT), PT (By licensed PT)     PT Frequency: 5x/week OT Frequency: 5x/week            Contractures Contractures Info: Not present    Additional Factors Info  Code Status, Allergies Code Status Info: DNR Allergies Info: Azithromycin, lisinopril, morphine, sulfa antibiotics           Current Medications (05/16/2022):  This is the current hospital active medication list Current Facility-Administered Medications  Medication Dose Route Frequency Provider Last Rate Last Admin   0.9 %  sodium chloride infusion   Intravenous Continuous Erick Colace, NP   Stopped at 04/30/22 0410   acetaminophen (TYLENOL) tablet 650 mg  650 mg Oral Q4H PRN Domenic Polite, MD   650 mg at 05/13/22 2124   albuterol (PROVENTIL) (2.5 MG/3ML) 0.083% nebulizer solution 2.5 mg  2.5 mg Nebulization Q6H PRN Erick Colace, NP       amiodarone (PACERONE) tablet 200 mg  200 mg Oral Daily Arrien, Jimmy Picket, MD   200 mg at 05/16/22 0801   apixaban (ELIQUIS) tablet 5 mg  5 mg Oral BID Erick Colace, NP   5 mg at 05/16/22 0801   budesonide (PULMICORT) nebulizer solution 0.5 mg  0.5 mg Nebulization BID Erick Colace, NP   0.5 mg at 05/16/22 0843   DULoxetine (CYMBALTA) DR capsule 60 mg  60 mg Oral Daily Erick Colace, NP   60 mg at 05/16/22 0801   feeding supplement (ENSURE ENLIVE / ENSURE PLUS) liquid 237 mL  237 mL Oral BID BM Domenic Polite, MD   237 mL at 17/91/50 5697   folic acid (FOLVITE) tablet 1 mg  1 mg Oral Daily Kaleen Mask, RPH   1 mg at 05/16/22 0802   guaiFENesin (ROBITUSSIN) 100 MG/5ML liquid 5 mL  5 mL Oral Q4H PRN Shela Leff, MD       hydrOXYzine (ATARAX) tablet 5 mg  5 mg Oral TID PRN Arrien, Jimmy Picket, MD   5 mg at 05/14/22 1448   levothyroxine (SYNTHROID) tablet 75 mcg  75 mcg Oral Q0600 Erick Colace, NP   75 mcg at 05/16/22 0556   lip balm (CARMEX) ointment   Topical PRN Erick Colace, NP   Given at 05/14/22 0526   melatonin  tablet 10 mg  10 mg Oral QHS PRN Domenic Polite, MD       metoprolol tartrate (LOPRESSOR) tablet 12.5 mg  12.5 mg Oral BID Hall, Carole N, DO   12.5 mg at 05/16/22 0801   multivitamin with minerals tablet 1 tablet  1 tablet Oral Daily Domenic Polite, MD   1 tablet at 05/16/22 0801   ondansetron (ZOFRAN) injection 4 mg  4 mg Intravenous Q6H PRN Erick Colace, NP   4 mg at 05/14/22 9480   Oral care mouth rinse  15 mL Mouth Rinse PRN Erick Colace, NP       rifaximin Doreene Nest) tablet 550 mg  550 mg Oral BID Erick Colace, NP   550 mg at 05/16/22 0810   rosuvastatin (CRESTOR) tablet 10 mg  10 mg Oral Daily Erick Colace, NP   10 mg at 05/16/22 0801     Discharge Medications: Please see discharge summary for a list  of discharge medications.  Relevant Imaging Results:  Relevant Lab Results:   Additional Information SSN [place], [place]

## 2022-05-16 NOTE — Progress Notes (Signed)
[facility] Progress Note     Assessment/ Plan:   AKI - b/l creat 0.7 on admission. Creat up to 3.5 after about 10 days of IV lasix. No nephrotoxins, contrast or ACEi/ ARB. On exam has livedo pattern in LE's c/w hypoperfusion. Pt is down 10-12 kg from admission. BP's are down a bit, UA is negative, renal US today w/o obstruction. Volume depletion certainly played a part; doubt ATN as UOP has been good and doubt any acute GN or AIN with normal UA/ no wbcs/ rbc's.  - diuretics on hold and stopped the NS on 11/6. She is starting to eat. - Daughter bedside and pt very tearful that she wasn't going home. - May also restart the Lasix 46m daily tomorrow. Signing off at this time; please reconsult as needed.  AHRF - w/ multifocal / aspiration PNA. SP abx.  Volume depletion - as above Morbid obesity NASH/ cirrhosis  Subjective:   Feeling better in that she has a little more of an appetite. Denies f/c; crying because she's not going home.   Objective:   BP (!) 97/44 (BP Location: Left Arm)   Pulse 72   Temp 98.1 F (36.7 C) (Oral)   Resp 18   Ht 5' 4"  (1.626 m)   Wt 108.6 kg   LMP 07/11/2007 (Approximate)   SpO2 97%   BMI 41.10 kg/m   Intake/Output Summary (Last 24 hours) at 05/16/2022 1455 Last data filed at 05/16/2022 0837 Gross per 24 hour  Intake 480 ml  Output --  Net 480 ml   Weight change: 1.8 kg  Physical Exam: Gen alert, no distress, obese WF no distress No rash, cyanosis or gangrene No jvd or bruits Chest clear bilat to bases, no rales/ wheezing RRR no MRG Abd soft ntnd no mass or ascites +bs, obese GU defer Ext no LE or UE edema, diffuse purplish discoloration w/ livedo pattern on bilat LE"s below the knees Neuro is alert, Ox 3 , nf, no asterixis  Imaging: No results found.  Labs: BMET Recent Labs  Lab 05/10/22 0121 05/11/22 0055 05/12/22 0052 05/13/22 0057 05/14/22 0115 05/15/22 0442 05/16/22 0106  NA 134* 134* 132* 134* 130* 134*  135  K 4.4 5.2* 5.0 5.5* 5.1 5.0 3.9  CL 93* 93* 90* 93* 90* 99 102  CO2 30 32 29 27 26 27 27   GLUCOSE 92 100* 90 86 76 84 95  BUN 36* 39* 39* 48* 60* 57* 45*  CREATININE 1.72* 1.94* 2.28* 2.94* 3.59* 3.45* 2.97*  CALCIUM 10.3 11.0* 11.4* 11.7* 11.1* 10.1 10.1  PHOS  --   --   --  5.7*  --  4.7* 4.1   CBC Recent Labs  Lab 05/10/22 0121 05/12/22 0052 05/15/22 0045  WBC 12.0* 12.6* 11.2*  HGB 12.8 13.7 11.9*  HCT 38.2 41.2 35.6*  MCV 96.5 96.3 95.7  PLT 177 169 PLATELET CLUMPS NOTED ON SMEAR, UNABLE TO ESTIMATE    Medications:     amiodarone  200 mg Oral Daily   apixaban  5 mg Oral BID   budesonide (PULMICORT) nebulizer solution  0.5 mg Nebulization BID   DULoxetine  60 mg Oral Daily   feeding supplement  237 mL Oral BID BM   folic acid  1 mg Oral Daily   lactulose  20 g Oral Daily   levothyroxine  75 mcg Oral Q0600   metoprolol tartrate  12.5 mg Oral BID   multivitamin with minerals  1 tablet Oral Daily  rosuvastatin  10 mg Oral Daily      Otelia Santee, MD 05/16/2022, 2:55 PM

## 2022-05-16 NOTE — Care Management Important Message (Signed)
Important Message  Patient Details  Name: Yolanda [name] MRN: [id number] Date of Birth: February 11, 1956   Medicare Important Message Given:  Yes     Shelda Altes 05/16/2022, 9:26 AM

## 2022-05-17 DIAGNOSIS — N179 Acute kidney failure, unspecified: Secondary | ICD-10-CM | POA: Diagnosis not present

## 2022-05-17 DIAGNOSIS — K7581 Nonalcoholic steatohepatitis (NASH): Secondary | ICD-10-CM | POA: Diagnosis not present

## 2022-05-17 DIAGNOSIS — I5033 Acute on chronic diastolic (congestive) heart failure: Secondary | ICD-10-CM | POA: Diagnosis not present

## 2022-05-17 DIAGNOSIS — I480 Paroxysmal atrial fibrillation: Secondary | ICD-10-CM | POA: Diagnosis not present

## 2022-05-17 LAB — BASIC METABOLIC PANEL
Anion gap: 7 (ref 5–15)
BUN: 35 mg/dL — ABNORMAL HIGH (ref 8–23)
CO2: 24 mmol/L (ref 22–32)
Calcium: 9.6 mg/dL (ref 8.9–10.3)
Chloride: 104 mmol/L (ref 98–111)
Creatinine, Ser: 2.55 mg/dL — ABNORMAL HIGH (ref 0.44–1.00)
GFR, Estimated: 20 mL/min — ABNORMAL LOW (ref >60)
Glucose, Bld: 81 mg/dL (ref 70–99)
Potassium: 4.9 mmol/L (ref 3.5–5.1)
Sodium: 135 mmol/L (ref 135–145)

## 2022-05-17 LAB — GLUCOSE, CAPILLARY
Glucose-Capillary: 63 mg/dL — ABNORMAL LOW (ref 70–99)
Glucose-Capillary: 75 mg/dL (ref 70–99)

## 2022-05-17 MED ORDER — MIDODRINE HCL 5 MG PO TABS
10.0000 mg | ORAL_TABLET | Freq: Two times a day (BID) | ORAL | Status: DC
  Administered 2022-05-17 – 2022-05-19 (×5): 10 mg via ORAL
  Filled 2022-05-17 (×5): qty 2

## 2022-05-17 MED ORDER — DEXTROSE 10 % IV SOLN: INTRAVENOUS | Status: DC

## 2022-05-17 MED ORDER — DEXTROSE 10 % IV SOLN: INTRAVENOUS | Status: AC

## 2022-05-17 NOTE — Plan of Care (Signed)
  Problem: Coping: Goal: Ability to adjust to condition or change in health will improve Outcome: Progressing   Problem: Fluid Volume: Goal: Ability to maintain a balanced intake and output will improve Outcome: Progressing   Problem: Health Behavior/Discharge Planning: Goal: Ability to manage health-related needs will improve Outcome: Progressing   Problem: Skin Integrity: Goal: Risk for impaired skin integrity will decrease Outcome: Progressing

## 2022-05-17 NOTE — Progress Notes (Addendum)
Occupational Therapy Treatment Patient Details Name: Yolanda [name] MRN: [id number] DOB: [birth year] Today's Date: 05/17/2022   History of present illness Pt is 66 y/o F admitted to [facility] on 04/28/22 wih SOB, hypoxia, acute on chronic heart failure, Afib with RVR. Worsening hypoxic respiratory failure 10/22 with transfer to ICU; ETT 10/22-10/25. Of note, recent hosptial admission 10/11-10/19 with acute encephalopathy. PMH of anxiety, depression, fibroylagia, GERD, HTN, hypothyroidism, obesity, RA, PE, liver cirrhosis.   OT comments  Pt with slow progression towards goals, limited by orthostatic BP this session. Pt needing min A for seated ADLs at EOB, min guard for bed mobility, and min A for transfers with RW. Pt needing increased encouragement/cues for safety and to tell therapist when she is feeling "dizzy". Pt able to sit EOB x10 min for seated ADL and take lateral steps toward HOB. Pt presenting with impairments listed below, will follow acutely. Recommend SNF at d/c.  BP supine: 94/47 (61) BP seated EOB: 101/49 (65) BP after standing transfer: 88/40 (53)   Recommendations for follow up therapy are one component of a multi-disciplinary discharge planning process, led by the attending physician.  Recommendations may be updated based on patient status, additional functional criteria and insurance authorization.    Follow Up Recommendations  Skilled nursing-short term rehab (<3 hours/day)    Assistance Recommended at Discharge Frequent or constant Supervision/Assistance  Patient can return home with the following  A little help with walking and/or transfers;A lot of help with bathing/dressing/bathroom;Assistance with cooking/housework;Direct supervision/assist for financial management;Assist for transportation;Help with stairs or ramp for entrance   Equipment Recommendations  None recommended by OT (defer)    Recommendations for Other Services      Precautions / Restrictions  Precautions Precautions: Fall Precaution Comments: watch SpO2, on 3L this session Restrictions Weight Bearing Restrictions: No       Mobility Bed Mobility Overal bed mobility: Needs Assistance Bed Mobility: Supine to Sit, Sit to Supine     Supine to sit: Min guard Sit to supine: Min guard        Transfers Overall transfer level: Needs assistance Equipment used: Rolling walker (2 wheels) Transfers: Sit to/from Stand Sit to Stand: Min assist                 Balance Overall balance assessment: Needs assistance Sitting-balance support: No upper extremity supported, Feet supported Sitting balance-Leahy Scale: Good Sitting balance - Comments: can reach outside BOS without LOB   Standing balance support: Bilateral upper extremity supported Standing balance-Leahy Scale: Poor Standing balance comment: cues to side step                           ADL either performed or assessed with clinical judgement   ADL Overall ADL's : Needs assistance/impaired Eating/Feeding: Set up;Sitting       Upper Body Bathing: Minimal assistance;Sitting Upper Body Bathing Details (indicate cue type and reason): washing BUE seated EOB Lower Body Bathing: Minimal assistance;Sitting/lateral leans;Sit to/from stand Lower Body Bathing Details (indicate cue type and reason): to wash legs         Toilet Transfer: Minimal assistance;BSC/3in1;Stand-pivot;Rolling walker (2 wheels) Toilet Transfer Details (indicate cue type and reason): simulated via functional mobility         Functional mobility during ADLs: Minimal assistance;Rolling walker (2 wheels);Cueing for safety;Cueing for sequencing General ADL Comments: limited by soft BP    Extremity/Trunk Assessment Upper Extremity Assessment Upper Extremity Assessment: Generalized weakness   Lower Extremity  Assessment Lower Extremity Assessment: Defer to PT evaluation        Vision   Vision Assessment?: No apparent visual  deficits   Perception Perception Perception: Not tested   Praxis Praxis Praxis: Not tested    Cognition Arousal/Alertness: Awake/alert Behavior During Therapy: WFL for tasks assessed/performed Overall Cognitive Status: Impaired/Different from baseline Area of Impairment: Attention, Memory, Following commands, Safety/judgement, Awareness, Problem solving                 Orientation Level: Disoriented to, Situation Current Attention Level: Selective Memory: Decreased recall of precautions, Decreased short-term memory Following Commands: Follows one step commands with increased time Safety/Judgement: Decreased awareness of safety, Decreased awareness of deficits Awareness: Emergent Problem Solving: Slow processing, Decreased initiation, Requires verbal cues General Comments: slowed processing and easily distracted, limited insight, occasional difficulty with wordfinding        Exercises Exercises: General Lower Extremity General Exercises - Lower Extremity Hip Flexion/Marching: AROM, Both, 10 reps, Supine (in prep for sitting EOB)    Shoulder Instructions       General Comments soft BP (see note)    Pertinent Vitals/ Pain       Pain Assessment Pain Assessment: No/denies pain  Home Living                                          Prior Functioning/Environment              Frequency  Min 2X/week        Progress Toward Goals  OT Goals(current goals can now be found in the care plan section)  Progress towards OT goals: Progressing toward goals  Acute Rehab OT Goals Patient Stated Goal: to feel better OT Goal Formulation: With patient Time For Goal Achievement: 05/19/22 Potential to Achieve Goals: Good ADL Goals Pt Will Perform Eating: with mod assist;sitting Pt Will Perform Grooming: with min guard assist;standing Pt Will Perform Lower Body Bathing: with modified independence;with adaptive equipment;sitting/lateral leans Pt Will  Perform Upper Body Dressing: with set-up;sitting Pt Will Perform Lower Body Dressing: with modified independence;sitting/lateral leans;with adaptive equipment Pt Will Transfer to Toilet: with modified independence;ambulating Pt/caregiver will Perform Home Exercise Program: Increased strength;Both right and left upper extremity;With minimal assist Additional ADL Goal #1: Will perform bed mobility independently as a precurson to ADLs. Additional ADL Goal #2: Pt will demonstrate improved endurance by standing x 2 minutes during ADLs. Additional ADL Goal #3: Pt will demonstrat selective attention during ADLs and mobility.  Plan Discharge plan needs to be updated    Co-evaluation                 AM-PAC OT "6 Clicks" Daily Activity     Outcome Measure   Help from another person eating meals?: None Help from another person taking care of personal grooming?: A Little Help from another person toileting, which includes using toliet, bedpan, or urinal?: A Lot Help from another person bathing (including washing, rinsing, drying)?: A Lot Help from another person to put on and taking off regular upper body clothing?: A Little Help from another person to put on and taking off regular lower body clothing?: A Lot 6 Click Score: 16    End of Session Equipment Utilized During Treatment: Rolling walker (2 wheels);Oxygen  OT Visit Diagnosis: Muscle weakness (generalized) (M62.81);Other symptoms and signs involving cognitive function   Activity Tolerance Patient limited by  fatigue   Patient Left in bed;with call bell/phone within reach;with bed alarm set   Nurse Communication Mobility status        Time: 9470-7615 OT Time Calculation (min): 34 min  Charges: OT General Charges $OT Visit: 1 Visit OT Treatments $Self Care/Home Management : 8-22 mins $Therapeutic Activity: 8-22 mins  Lynnda Child, OTD, OTR/L Acute Rehab [phone]) [phone] - [place] 05/17/2022, 8:39 AM

## 2022-05-17 NOTE — Progress Notes (Addendum)
PROGRESS NOTE    Yolanda [name]  WUX:[id number] DOB: Mar 05, 1956 DOA: 04/28/2022 PCP: Celene Squibb, MD   66/F chronically ill with [name] cirrhosis, diastolic CHF, obesity, PE not on AC, A-fib not on AC, chronic respiratory failure on 3L [place] O2,  OSA, hypertension, anxiety, depression, GERD, rheumatoid arthritis, hypothyroidism.  She was recently admitted at [facility] 10/11-10/19 with AMS and acute cystitis.   Work-up was negative for PE, found to have new onset Afib, multi-focal PNA and effusion and was diuresed and discharged home on 10/19 on antibiotics. -Return to the ED 10/20 with worsening shortness of breath and hypoxia, placed on BiPAP, CXR again showed multi-focal bilateral infiltrates, restarted antibiotics and diuretics on 10/22 her respiratory status began to decline despite bipap and PCCM was consulted for transfer to the ICU 10/22 intubated 10/25: Extubated, remained encephalopathic 10/26: started on amio gtt for Afib/RVR Transferred to [facility] service 10/28 10/27-10/29 with intermittent bursts of rapid A-fib-cards consulting-> required multiple boluses of IV amiodarone while on amiodarone gtt. 11/3: onwards w/ worsening AKI, hydrated, nephrology consulted 11/8: added midodrine  Subjective: Feels ok, no events overnight, BP soft without symptoms   Assessment and Plan:   Acute on Chronic Hypoxic Respiratory Failure secondary to Multifocal PNA: Aspiration Acute on chronic HFpEF with pulmonary edema -Extubated 10/25 -Concern for recurrent aspiration, possibly esophageal, SLP eval noted -Completed 7 days of IV Zosyn on 10/29, leukocytosis worsened,  Foley catheter was removed, UA unremarkable, left subclavian central line removed, blood cultures negative so far, leukocytosis now resolved -Diuresed as well, see discussion below -Pulmonary toilet, aspiration precautions, wean O2 as tolerated -Activity, PT eval-now SNF recommended   Dysphagia -Barium esophagram x2 , second exam with  concern for mod esoph dysmotility -Aspiration precautions -Tolerating current diet at this time   Acute on chronic diastolic CHF Decompensated liver cirrhosis with fluid overload -Echo 10/23 with EF of 25-36%, grade 1 diastolic dysfunction, preserved RV function -Diuresed with IV Lasix, now euvolemic -Albumin is low at baseline with liver disease, only 1.7 unfortunately-poor prognosis -diuretics held w/ AKI, hydrated -BP in 80s, add midodrine, resume diuretics tomorrow if BP tolerates   Atrial Fibrillation with RVR  -Required IV amiodarone bolus multiple times -Continue Lopressor and Eliquis -was on Amio gtt., cardiology consulting, now PO amio   AKI  -creat normal on admit, trended up to 3.5 after about 10 days of IV lasix. Afib RVR -renal US w/o obstruction.appreciate  renal input - diuretics on hold and stopped the NS on 11/6. - likley resume diuretics tomorrow, ? Lasix 45m daily, hopefully Aldactone down the road   hypokalemia -Replaced   Acute metabolic encephalopathy.   -Suspect mixed picture of post infection, also hepatic encephalopathic component -Improving, completed antibiotic course, repeat ammonia level was normal   NASH liver cirrhosis  -Continue rifaximin, lactulose -Diuretics as above   Type 2 DM -Holding metformin continue sliding scale insulin   Hypothyroidism -Continue levothyroxine   GERD -Esophagram PPI and Carafate   Class 3 obesity (HCC) Calculated BMI is 44.3 Patient with very poor physical functional status, uses a walker for ambulation.    Depression and anxiety,  Continue with daily duloxetine.  Discontinued alprazolam.    DVT prophylaxis: Eliquis Code Status: DNR Family Communication: no Family at bedside Disposition Plan: SNF in 2-3days   Consultants: PCCM, Renal, Cards     Consultants:    Procedures:   Antimicrobials:    Objective: Vitals:   05/17/22 0355 05/17/22 0741 05/17/22 0846 05/17/22 0850  BP: (Marland Kitchen  110/49  (!) 106/53  (!) 88/40  Pulse:  71    Resp: 16 17    Temp: 98.1 F (36.7 C)     TempSrc: Oral     SpO2: 100% 100% 100%   Weight: 107.9 kg     Height:        Intake/Output Summary (Last 24 hours) at 05/17/2022 0900 Last data filed at 05/17/2022 0700 Gross per 24 hour  Intake 120 ml  Output 600 ml  Net -480 ml   Filed Weights   05/15/22 0500 05/16/22 0038 05/17/22 0355  Weight: 106.8 kg 108.6 kg 107.9 kg    Examination:  General exam: Appears calm and comfortable, AAOx2 Respiratory system: Clear to auscultation Cardiovascular system: S1 & S2 heard, RRR.  Abd: nondistended, soft and nontender.Normal bowel sounds heard. Extremities: no edema Skin: No rashes Psychiatry:  Mood & affect appropriate.     Data Reviewed:   CBC: Recent Labs  Lab 05/12/22 0052 05/15/22 0045  WBC 12.6* 11.2*  HGB 13.7 11.9*  HCT 41.2 35.6*  MCV 96.3 95.7  PLT 169 PLATELET CLUMPS NOTED ON SMEAR, UNABLE TO ESTIMATE   Basic Metabolic Panel: Recent Labs  Lab 05/11/22 0055 05/12/22 0052 05/13/22 0057 05/14/22 0115 05/15/22 0442 05/16/22 0106 05/17/22 0042  NA 134*   < > 134* 130* 134* 135 135  K 5.2*   < > 5.5* 5.1 5.0 3.9 4.9  CL 93*   < > 93* 90* 99 102 104  CO2 32   < > 27 26 27 27 24   GLUCOSE 100*   < > 86 76 84 95 81  BUN 39*   < > 48* 60* 57* 45* 35*  CREATININE 1.94*   < > 2.94* 3.59* 3.45* 2.97* 2.55*  CALCIUM 11.0*   < > 11.7* 11.1* 10.1 10.1 9.6  MG 2.2  --   --   --   --   --   --   PHOS  --   --  5.7*  --  4.7* 4.1  --    < > = values in this interval not displayed.   GFR: Estimated Creatinine Clearance: 25.7 mL/min (A) (by C-G formula based on SCr of 2.55 mg/dL (H)). Liver Function Tests: Recent Labs  Lab 05/13/22 0057 05/14/22 1705 05/15/22 0442 05/16/22 0106  AST  --  112* 102*  --   ALT  --  48* 47*  --   ALKPHOS  --  84 96  --   BILITOT  --  2.2* 1.8*  --   PROT  --  8.0 8.0  --   ALBUMIN 2.1* 1.8* 1.7*  1.7* 1.7*   No results for input(s): "LIPASE",  "AMYLASE" in the last 168 hours. Recent Labs  Lab 05/14/22 1707  AMMONIA 49*   Coagulation Profile: No results for input(s): "INR", "PROTIME" in the last 168 hours. Cardiac Enzymes: No results for input(s): "CKTOTAL", "CKMB", "CKMBINDEX", "TROPONINI" in the last 168 hours. BNP (last 3 results) No results for input(s): "PROBNP" in the last 8760 hours. HbA1C: No results for input(s): "HGBA1C" in the last 72 hours. CBG: Recent Labs  Lab 05/15/22 0517 05/15/22 1126 05/15/22 2252 05/17/22 0403 05/17/22 0441  GLUCAP 77 85 69* 63* 75   Lipid Profile: No results for input(s): "CHOL", "HDL", "LDLCALC", "TRIG", "CHOLHDL", "LDLDIRECT" in the last 72 hours. Thyroid Function Tests: No results for input(s): "TSH", "T4TOTAL", "FREET4", "T3FREE", "THYROIDAB" in the last 72 hours. Anemia Panel: No results for input(s): "VITAMINB12", "FOLATE", "FERRITIN", "  TIBC", "IRON", "RETICCTPCT" in the last 72 hours. Urine analysis:    Component Value Date/Time   COLORURINE YELLOW 05/07/2022 1236   APPEARANCEUR CLEAR 05/07/2022 1236   APPEARANCEUR Clear 03/08/2022 1549   LABSPEC 1.015 05/07/2022 1236   PHURINE 7.5 05/07/2022 1236   GLUCOSEU NEGATIVE 05/07/2022 1236   HGBUR TRACE (A) 05/07/2022 1236   BILIRUBINUR NEGATIVE 05/07/2022 1236   BILIRUBINUR Negative 03/08/2022 1549   KETONESUR NEGATIVE 05/07/2022 1236   PROTEINUR NEGATIVE 05/07/2022 1236   UROBILINOGEN negative (A) 07/02/2019 0932   UROBILINOGEN 0.2 04/15/2015 0028   NITRITE NEGATIVE 05/07/2022 1236   LEUKOCYTESUR NEGATIVE 05/07/2022 1236   Sepsis Labs: @LABRCNTIP (procalcitonin:4,lacticidven:4)  ) Recent Results (from the past 240 hour(s))  Urine Culture     Status: Abnormal   Collection Time: 05/07/22 12:36 PM   Specimen: Urine, Clean Catch  Result Value Ref Range Status   Specimen Description URINE, CLEAN CATCH  Final   Special Requests NONE  Final   Culture (A)  Final    <10,000 COLONIES/mL INSIGNIFICANT GROWTH Performed  at [facility], [place] [address], [place], [place] [address]    Report Status 05/08/2022 FINAL  Final     Radiology Studies: DG ESOPHAGUS W SINGLE CM (SOL OR THIN BA)  Result Date: 05/16/2022 CLINICAL DATA:  66 year old female with history of NASH cirrhosis, chronic respiratory failure, aspiration pneumonia who presents with ongoing dysphagia. Patient had single-contrast esophagus on 05/05/2022 and 05/09/2022 due to dysphagia, exam is for limited due to patient mobility and metabolic encephalopathy. The encephalopathy has resolved, request for repeat esophagram for further evaluation. EXAM: ESOPHAGUS/BARIUM SWALLOW/TABLET STUDY TECHNIQUE: Single contrast examination was performed using thin liquid barium. This exam was performed by Durenda Guthrie, PA-C, and was supervised and interpreted by Abigail Miyamoto, MD. FLUOROSCOPY: Radiation Exposure Index (as provided by the fluoroscopic device): 35.2 mGy Kerma COMPARISON:  None Available. FINDINGS: Limited examination due to patient's inability to stand. Swallowing: Patient drank contrast in left lateral decubitus position, swallowing appears normal. No vestibular penetration or aspiration seen. Pharynx: Exam limited due to patient's body habitus, visualized pharynx appears normal. Esophagus: The very distal esophagus could not be fully distended, possibly due to technique. Cannot exclude stricture including on 28/27. Esophageal motility: Esophageal motility checked in LPO position. moderate esophageal dysmotility with tertiary contraction. Patient had moderate amount of residual thin barium at the level of T3-T4, no obvious reflux into pharynx seen, but patient coughed multiple times during the examination. Gastroesophageal reflux: None visualized. Ingested 67m barium tablet: 13 mm barium tablet became stuck at GE junction for less than 1 minute, if passed freely into the stomach when patient drank extra sips of thin barium. Other: None. IMPRESSION: Limited exam  with patient in LPO position. Limited by body habitus and inability to stand upright. Moderate esophageal dysmotility, likely presbyesophagus. An area of persistent moderate underdistention could be technique related or represent a true stricture. This could either be further evaluated with endoscopy or more completely characterized when patient is clinically stable and able to be evaluated by outpatient double contrast esophagram. Electronically Signed   By: KAbigail MiyamotoM.D.   On: 05/16/2022 16:14     Scheduled Meds:  amiodarone  200 mg Oral Daily   apixaban  5 mg Oral BID   budesonide (PULMICORT) nebulizer solution  0.5 mg Nebulization BID   DULoxetine  60 mg Oral Daily   feeding supplement  237 mL Oral BID BM   folic acid  1 mg Oral Daily  lactulose  20 g Oral Daily   levothyroxine  75 mcg Oral Q0600   metoprolol tartrate  12.5 mg Oral BID   multivitamin with minerals  1 tablet Oral Daily   rosuvastatin  10 mg Oral Daily   Continuous Infusions:  sodium chloride Stopped (04/30/22 0410)   dextrose 20 mL/hr at 05/17/22 0436     LOS: 19 days    Time spent: 81mn    PDomenic Polite MD [facility]   05/17/2022, 9:00 AM

## 2022-05-17 NOTE — Progress Notes (Signed)
Inpatient Rehabilitation Admissions Coordinator   Noted SNF chosen. We will sign off.  Danne Baxter, RN, MSN Rehab Admissions Coordinator [phone] 05/17/2022 8:06 AM

## 2022-05-17 NOTE — Progress Notes (Signed)
Physical Therapy Treatment Patient Details Name: Yolanda [name] MRN: [id number] DOB: 1955-10-02 Today's Date: 05/17/2022   History of Present Illness Pt is 66 y/o F admitted to [facility] on 04/28/22 wih SOB, hypoxia, acute on chronic heart failure, Afib with RVR. Worsening hypoxic respiratory failure 10/22 with transfer to ICU; ETT 10/22-10/25. Of note, recent hosptial admission 10/11-10/19 with acute encephalopathy. PMH of anxiety, depression, fibroylagia, GERD, HTN, hypothyroidism, obesity, RA, PE, liver cirrhosis.    PT Comments    Pt demonstrating some progress towards her physical therapy goals, with slightly improved activity tolerance and ambulation distance. Pt ambulating 16", then 10" with walker and close chair follow. Pt positive for orthostatics, but improved BP with increased time with change in position and seated exercises/isometrics. Will continue to progress as tolerated.   Orthostatic Vitals: Supine: 99/58 (68) Sitting: 87/47 (60) Sitting on BSC: 89/68 (77) Sitting in chair post mobility: 123/67 (82)   Recommendations for follow up therapy are one component of a multi-disciplinary discharge planning process, led by the attending physician.  Recommendations may be updated based on patient status, additional functional criteria and insurance authorization.  Follow Up Recommendations  Skilled nursing-short term rehab (<3 hours/day) Can patient physically be transported by private vehicle: Yes   Assistance Recommended at Discharge Frequent or constant Supervision/Assistance  Patient can return home with the following A lot of help with bathing/dressing/bathroom;Assistance with cooking/housework;Direct supervision/assist for medications management;Direct supervision/assist for financial management;Assist for transportation;Help with stairs or ramp for entrance;A little help with walking and/or transfers   Equipment Recommendations  BSC/3in1;Wheelchair (measurements  PT);Wheelchair cushion (measurements PT)    Recommendations for Other Services       Precautions / Restrictions Precautions Precautions: Fall Precaution Comments: orthostatic Restrictions Weight Bearing Restrictions: No     Mobility  Bed Mobility Overal bed mobility: Needs Assistance Bed Mobility: Supine to Sit     Supine to sit: Min guard          Transfers Overall transfer level: Needs assistance Equipment used: Rolling walker (2 wheels) Transfers: Sit to/from Stand, Bed to chair/wheelchair/BSC Sit to Stand: Min assist Stand pivot transfers: Min guard              Ambulation/Gait Ambulation/Gait assistance: Min assist Gait Distance (Feet): 26 Feet (16", 10") Assistive device: Rolling walker (2 wheels) Gait Pattern/deviations: Decreased stride length, Step-through pattern Gait velocity: decreased Gait velocity interpretation: <1.8 ft/sec, indicate of risk for recurrent falls   General Gait Details: MinA for balance, cues for environmental navigation and self monitoring symptoms   Stairs             Wheelchair Mobility    Modified Rankin (Stroke Patients Only)       Balance Overall balance assessment: Needs assistance Sitting-balance support: No upper extremity supported, Feet supported Sitting balance-Leahy Scale: Good     Standing balance support: Bilateral upper extremity supported Standing balance-Leahy Scale: Poor                              Cognition Arousal/Alertness: Awake/alert Behavior During Therapy: WFL for tasks assessed/performed Overall Cognitive Status: Impaired/Different from baseline Area of Impairment: Attention, Memory, Following commands, Safety/judgement, Awareness, Problem solving                 Orientation Level: Disoriented to, Situation Current Attention Level: Selective Memory: Decreased recall of precautions, Decreased short-term memory Following Commands: Follows one step commands with  increased time Safety/Judgement: Decreased awareness of  safety, Decreased awareness of deficits Awareness: Emergent Problem Solving: Slow processing, Decreased initiation, Requires verbal cues General Comments: Pt asking PT if her daughter was involved in car accident        Exercises General Exercises - Lower Extremity Long Arc Quad: Both, 10 reps, Seated Hip ABduction/ADduction: Both, 10 reps, Supine Straight Leg Raises: Both, 10 reps, Seated    General Comments        Pertinent Vitals/Pain Pain Assessment Pain Assessment: No/denies pain    Home Living                          Prior Function            PT Goals (current goals can now be found in the care plan section) Acute Rehab PT Goals Potential to Achieve Goals: Good Progress towards PT goals: Progressing toward goals    Frequency    Min 2X/week      PT Plan Current plan remains appropriate    Co-evaluation              AM-PAC PT "6 Clicks" Mobility   Outcome Measure  Help needed turning from your back to your side while in a flat bed without using bedrails?: A Little Help needed moving from lying on your back to sitting on the side of a flat bed without using bedrails?: A Little Help needed moving to and from a bed to a chair (including a wheelchair)?: A Little Help needed standing up from a chair using your arms (e.g., wheelchair or bedside chair)?: A Little Help needed to walk in hospital room?: A Little Help needed climbing 3-5 steps with a railing? : Total 6 Click Score: 16    End of Session Equipment Utilized During Treatment: Oxygen Activity Tolerance: Patient tolerated treatment well Patient left: with call bell/phone within reach;in chair;with chair alarm set Nurse Communication: Mobility status PT Visit Diagnosis: Muscle weakness (generalized) (M62.81);Other abnormalities of gait and mobility (R26.89)     Time: 7998-7215 PT Time Calculation (min) (ACUTE ONLY): 34  min  Charges:  $Therapeutic Activity: 23-37 mins                     Yolanda [name], PT, DPT Acute Rehabilitation Services Office [phone]    Yolanda [name] 05/17/2022, 3:13 PM

## 2022-05-17 NOTE — Progress Notes (Addendum)
Received a call from bedside RN regarding hypoglycemia of 63.  Hypoglycemia protocol implemented.  Added D10 W at 20 cc/h x 1 day.  Will increase IV fluid rate if indicated.  CBG every 1 hour x3 hours until glucose level has normalized.  We will continue to closely monitor and treat as indicated.  Time spent: 15 minutes

## 2022-05-17 NOTE — TOC Progression Note (Signed)
Transition of Care [facility]) - Progression Note    Patient Details  Name: Yolanda [name] MRN: [id number] Date of Birth: 1955-08-26  Transition of Care [facility]) CM/SW [place], [place] Phone Number: 05/17/2022, 11:10 AM  Clinical Narrative:     CSW spoke with [facility] and updated that pt is not medically ready today; may be later in the week. [facility] liaison explained that is fine. [place] will require a covid test within 24 hours prior to DC.   CSW left voicemail with Melissa with update regarding anticipated DC; requested Melissa update her sister as well.   Expected Discharge Plan: [place] Barriers to Discharge: Continued Medical Work up  Expected Discharge Plan and Services Expected Discharge Plan: [place] In-house Referral: Clinical Social Work Discharge Planning Services: CM Consult Post Acute Care Choice: Home Health, Resumption of Svcs/PTA Provider Living arrangements for the past 2 months: Single Family Home                   DME Agency: NA       HH Arranged: RN, Disease Management, Nurse's Aide, Social Work [employer] Agency: Microbiologist ([place]) Date [place]: 05/04/22 Time [place]: 1626 Representative spoke with at [place]: [place] ([place]) Interventions    Readmission Risk Interventions    05/04/2022    4:24 PM 04/25/2022    1:27 PM 09/22/2021   11:13 AM  Readmission Risk Prevention Plan  Post Dischage Appt   Complete  Medication Screening   Complete  Transportation Screening Complete Complete Complete  HRI or [place]  Complete   Social Work Consult for [place] Planning/Counseling  Complete   Palliative Care Screening  Not Applicable   Medication Review Press photographer) Complete Complete   HRI or [place] Complete    SW Recovery Care/Counseling Consult Complete    [place] Not Applicable

## 2022-05-17 NOTE — Plan of Care (Signed)
  Problem: Health Behavior/Discharge Planning: Goal: Ability to identify and utilize available resources and services will improve Outcome: Progressing Goal: Ability to manage health-related needs will improve Outcome: Progressing   Problem: Clinical Measurements: Goal: Respiratory complications will improve Outcome: Progressing   Problem: Clinical Measurements: Goal: Cardiovascular complication will be avoided Outcome: Progressing   Problem: Activity: Goal: Risk for activity intolerance will decrease Outcome: Progressing   Problem: Nutrition: Goal: Adequate nutrition will be maintained Outcome: Progressing   Problem: Pain Managment: Goal: General experience of comfort will improve Outcome: Progressing   Problem: Safety: Goal: Ability to remain free from injury will improve Outcome: Progressing

## 2022-05-17 NOTE — Progress Notes (Signed)
Hypoglycemic Event  CBG: 63 mg/dL  Treatment: 4oz juice  Symptoms: none  Follow-up CBG: [id number] CBG Result:75 mg/dL  Possible Reasons for Event: Inadequate meal intake  Comments/MD notified:C. Franklin, DO.  [id number] Orders received.  Jorge Mandril

## 2022-05-18 DIAGNOSIS — K7581 Nonalcoholic steatohepatitis (NASH): Secondary | ICD-10-CM | POA: Diagnosis not present

## 2022-05-18 DIAGNOSIS — I480 Paroxysmal atrial fibrillation: Secondary | ICD-10-CM | POA: Diagnosis not present

## 2022-05-18 DIAGNOSIS — I5033 Acute on chronic diastolic (congestive) heart failure: Secondary | ICD-10-CM | POA: Diagnosis not present

## 2022-05-18 DIAGNOSIS — N179 Acute kidney failure, unspecified: Secondary | ICD-10-CM | POA: Diagnosis not present

## 2022-05-18 LAB — BASIC METABOLIC PANEL
Anion gap: 6 (ref 5–15)
BUN: 27 mg/dL — ABNORMAL HIGH (ref 8–23)
CO2: 27 mmol/L (ref 22–32)
Calcium: 10 mg/dL (ref 8.9–10.3)
Chloride: 103 mmol/L (ref 98–111)
Creatinine, Ser: 2.49 mg/dL — ABNORMAL HIGH (ref 0.44–1.00)
GFR, Estimated: 21 mL/min — ABNORMAL LOW (ref >60)
Glucose, Bld: 83 mg/dL (ref 70–99)
Potassium: 3.7 mmol/L (ref 3.5–5.1)
Sodium: 136 mmol/L (ref 135–145)

## 2022-05-18 LAB — CBC
HCT: 33.9 % — ABNORMAL LOW (ref 36.0–46.0)
Hemoglobin: 11.3 g/dL — ABNORMAL LOW (ref 12.0–15.0)
MCH: 32.1 pg (ref 26.0–34.0)
MCHC: 33.3 g/dL (ref 30.0–36.0)
MCV: 96.3 fL (ref 80.0–100.0)
Platelets: 95 10*3/uL — ABNORMAL LOW (ref 150–400)
RBC: 3.52 MIL/uL — ABNORMAL LOW (ref 3.87–5.11)
RDW: 17.5 % — ABNORMAL HIGH (ref 11.5–15.5)
WBC: 8.1 10*3/uL (ref 4.0–10.5)
nRBC: 0 % (ref 0.0–0.2)

## 2022-05-18 LAB — GLUCOSE, CAPILLARY
Glucose-Capillary: 77 mg/dL (ref 70–99)
Glucose-Capillary: 92 mg/dL (ref 70–99)

## 2022-05-18 MED ORDER — FUROSEMIDE 40 MG PO TABS
40.0000 mg | ORAL_TABLET | Freq: Every day | ORAL | Status: DC
  Administered 2022-05-18 – 2022-05-19 (×2): 40 mg via ORAL
  Filled 2022-05-18 (×2): qty 1

## 2022-05-18 MED ORDER — POTASSIUM CHLORIDE CRYS ER 20 MEQ PO TBCR
20.0000 meq | EXTENDED_RELEASE_TABLET | Freq: Every day | ORAL | Status: DC
  Administered 2022-05-18 – 2022-05-19 (×2): 20 meq via ORAL
  Filled 2022-05-18 (×2): qty 1

## 2022-05-18 MED FILL — Medication: Qty: 1 | Status: AC

## 2022-05-18 NOTE — Progress Notes (Addendum)
PROGRESS NOTE    Yolanda [name]  TOI:[id number] DOB: 02-08-1956 DOA: 04/28/2022 PCP: Celene Squibb, MD   66/F chronically ill with [name] cirrhosis, diastolic CHF, obesity, PE not on AC, A-fib not on AC, chronic respiratory failure on 3L  O2,  OSA, hypertension, anxiety, depression, GERD, rheumatoid arthritis, hypothyroidism.  She was recently admitted at [facility] 10/11-10/19 with AMS and acute cystitis.   Work-up was negative for PE, found to have new onset Afib, multi-focal PNA and effusion and was diuresed and discharged home on 10/19 on antibiotics. -Return to the ED 10/20 with worsening shortness of breath and hypoxia, placed on BiPAP, CXR again showed multi-focal bilateral infiltrates, restarted antibiotics and diuretics on 10/22 her respiratory status began to decline despite bipap and PCCM was consulted for transfer to the ICU 10/22 intubated 10/25: Extubated, remained encephalopathic 10/26: started on amio gtt for Afib/RVR Transferred to [facility] service 10/28 10/27-10/29 with intermittent bursts of rapid A-fib-cards consulting-> required multiple boluses of IV amiodarone while on amiodarone gtt. 11/3: onwards w/ worsening AKI, hydrated, nephrology consulted 11/8: added midodrine 11/9, resumed oral Lasix  Subjective: Feels ok overall but tired, having BMs, denies any shortness of breath   Assessment and Plan:   Acute on Chronic Hypoxic Respiratory Failure secondary to Multifocal PNA: Aspiration Acute on chronic HFpEF with pulmonary edema -Extubated 10/25 -Concern for recurrent aspiration, possibly esophageal, SLP eval noted -Completed 7 days of IV Zosyn on 10/29 -Diuresed as well, see discussion below -Pulmonary toilet, aspiration precautions, wean O2 as tolerated -Activity, PT eval-now SNF recommended -Discharge planning   Dysphagia -Barium esophagram x2 , second exam with concern for mod esoph dysmotility -Aspiration precautions -Tolerating current diet at this time   Acute  on chronic diastolic CHF Decompensated liver cirrhosis with fluid overload -Echo 10/23 with EF of 83-38%, grade 1 diastolic dysfunction, preserved RV function -Diuresed with IV Lasix, now euvolemic -Albumin is low at baseline with liver disease, only 1.7 unfortunately-poor prognosis -diuretics held w/ AKI, hydrated, GDMT limited by hypotension and CKD -Blood pressure improved, on midodrine, restart Lasix today   Atrial Fibrillation with RVR  -Required IV amiodarone bolus multiple times -Continue  Eliquis -was on Amio gtt., cardiology consulting, now PO amio and metoprolol   AKI  -creat normal on admit, trended up to 3.5 after about 10 days of IV lasix. Afib RVR -renal US w/o obstruction.appreciate  renal input - diuretics on hold and stopped the NS on 11/6. -Nephrology signed off, recommended to resume diuretics today, Lasix 44m daily, hopefully Aldactone down the road   hypokalemia -Replaced   Acute metabolic encephalopathy.   -Suspect mixed picture of post infection, also hepatic encephalopathic component -Improving, completed antibiotic course, repeat ammonia level was normal -Continue lactulose   NASH liver cirrhosis  -Continue rifaximin, lactulose -Diuretics as above   Type 2 DM -Holding metformin continue sliding scale insulin   Hypothyroidism -Continue levothyroxine   GERD -Esophagram PPI and Carafate   Class 3 obesity (HCC) Calculated BMI is 44.3 Patient with very poor physical functional status, uses a walker for ambulation.    Depression and anxiety,  Continue with daily duloxetine.  Discontinued alprazolam.    DVT prophylaxis: Eliquis Code Status: DNR Family Communication: no Family at bedside Disposition Plan: SNF tomorrow if stable   Consultants: PCCM, Renal, Cards  Procedures:   Antimicrobials:    Objective: Vitals:   05/17/22 2047 05/18/22 0540 05/18/22 0800 05/18/22 0929  BP:  (!) 106/55 (!) 115/53   Pulse:   68  Resp:  15    Temp:   97.9 F (36.6 C)    TempSrc:  Oral    SpO2: 98% 97%  97%  Weight:  107.6 kg    Height:        Intake/Output Summary (Last 24 hours) at 05/18/2022 1136 Last data filed at 05/18/2022 0900 Gross per 24 hour  Intake 360 ml  Output 300 ml  Net 60 ml   Filed Weights   05/16/22 0038 05/17/22 0355 05/18/22 0540  Weight: 108.6 kg 107.9 kg 107.6 kg    Examination:  General exam: Pleasant chronically ill female sitting up in bed, AAO x2 HEENT: Neck obese unable to assess JVD CVS: S1-S2, regular rhythm Lungs: Decreased breath sounds bases Abdomen: Soft, nontender, bowel sounds present Extremities: No edema  Skin: No rashes Psychiatry:  Mood & affect appropriate.     Data Reviewed:   CBC: Recent Labs  Lab 05/12/22 0052 05/15/22 0045 05/18/22 0107  WBC 12.6* 11.2* 8.1  HGB 13.7 11.9* 11.3*  HCT 41.2 35.6* 33.9*  MCV 96.3 95.7 96.3  PLT 169 PLATELET CLUMPS NOTED ON SMEAR, UNABLE TO ESTIMATE 95*   Basic Metabolic Panel: Recent Labs  Lab 05/13/22 0057 05/14/22 0115 05/15/22 0442 05/16/22 0106 05/17/22 0042 05/18/22 0107  NA 134* 130* 134* 135 135 136  K 5.5* 5.1 5.0 3.9 4.9 3.7  CL 93* 90* 99 102 104 103  CO2 27 26 27 27 24 27   GLUCOSE 86 76 84 95 81 83  BUN 48* 60* 57* 45* 35* 27*  CREATININE 2.94* 3.59* 3.45* 2.97* 2.55* 2.49*  CALCIUM 11.7* 11.1* 10.1 10.1 9.6 10.0  PHOS 5.7*  --  4.7* 4.1  --   --    GFR: Estimated Creatinine Clearance: 26.3 mL/min (A) (by C-G formula based on SCr of 2.49 mg/dL (H)). Liver Function Tests: Recent Labs  Lab 05/13/22 0057 05/14/22 1705 05/15/22 0442 05/16/22 0106  AST  --  112* 102*  --   ALT  --  48* 47*  --   ALKPHOS  --  84 96  --   BILITOT  --  2.2* 1.8*  --   PROT  --  8.0 8.0  --   ALBUMIN 2.1* 1.8* 1.7*  1.7* 1.7*   No results for input(s): "LIPASE", "AMYLASE" in the last 168 hours. Recent Labs  Lab 05/14/22 1707  AMMONIA 49*   Coagulation Profile: No results for input(s): "INR", "PROTIME" in the last 168  hours. Cardiac Enzymes: No results for input(s): "CKTOTAL", "CKMB", "CKMBINDEX", "TROPONINI" in the last 168 hours. BNP (last 3 results) No results for input(s): "PROBNP" in the last 8760 hours. HbA1C: No results for input(s): "HGBA1C" in the last 72 hours. CBG: Recent Labs  Lab 05/15/22 2252 05/17/22 0403 05/17/22 0441 05/18/22 0017 05/18/22 0544  GLUCAP 69* 63* 75 92 77   Lipid Profile: No results for input(s): "CHOL", "HDL", "LDLCALC", "TRIG", "CHOLHDL", "LDLDIRECT" in the last 72 hours. Thyroid Function Tests: No results for input(s): "TSH", "T4TOTAL", "FREET4", "T3FREE", "THYROIDAB" in the last 72 hours. Anemia Panel: No results for input(s): "VITAMINB12", "FOLATE", "FERRITIN", "TIBC", "IRON", "RETICCTPCT" in the last 72 hours. Urine analysis:    Component Value Date/Time   COLORURINE YELLOW 05/07/2022 1236   APPEARANCEUR CLEAR 05/07/2022 1236   APPEARANCEUR Clear 03/08/2022 1549   LABSPEC 1.015 05/07/2022 1236   PHURINE 7.5 05/07/2022 1236   GLUCOSEU NEGATIVE 05/07/2022 1236   HGBUR TRACE (A) 05/07/2022 1236   BILIRUBINUR NEGATIVE 05/07/2022 1236   BILIRUBINUR  Negative 03/08/2022 [place] 05/07/2022 [place] 05/07/2022 1236   UROBILINOGEN negative (A) 07/02/2019 0932   UROBILINOGEN 0.2 04/15/2015 0028   NITRITE NEGATIVE 05/07/2022 1236   LEUKOCYTESUR NEGATIVE 05/07/2022 1236   Sepsis Labs: @LABRCNTIP (procalcitonin:4,lacticidven:4)  ) No results found for this or any previous visit (from the past 240 hour(s)).    Radiology Studies: DG ESOPHAGUS W SINGLE CM (SOL OR THIN BA)  Result Date: 05/16/2022 CLINICAL DATA:  66 year old female with history of NASH cirrhosis, chronic respiratory failure, aspiration pneumonia who presents with ongoing dysphagia. Patient had single-contrast esophagus on 05/05/2022 and 05/09/2022 due to dysphagia, exam is for limited due to patient mobility and metabolic encephalopathy. The encephalopathy has  resolved, request for repeat esophagram for further evaluation. EXAM: ESOPHAGUS/BARIUM SWALLOW/TABLET STUDY TECHNIQUE: Single contrast examination was performed using thin liquid barium. This exam was performed by Durenda Guthrie, PA-C, and was supervised and interpreted by Abigail Miyamoto, MD. FLUOROSCOPY: Radiation Exposure Index (as provided by the fluoroscopic device): 35.2 mGy Kerma COMPARISON:  None Available. FINDINGS: Limited examination due to patient's inability to stand. Swallowing: Patient drank contrast in left lateral decubitus position, swallowing appears normal. No vestibular penetration or aspiration seen. Pharynx: Exam limited due to patient's body habitus, visualized pharynx appears normal. Esophagus: The very distal esophagus could not be fully distended, possibly due to technique. Cannot exclude stricture including on 28/27. Esophageal motility: Esophageal motility checked in LPO position. moderate esophageal dysmotility with tertiary contraction. Patient had moderate amount of residual thin barium at the level of T3-T4, no obvious reflux into pharynx seen, but patient coughed multiple times during the examination. Gastroesophageal reflux: None visualized. Ingested 75m barium tablet: 13 mm barium tablet became stuck at GE junction for less than 1 minute, if passed freely into the stomach when patient drank extra sips of thin barium. Other: None. IMPRESSION: Limited exam with patient in LPO position. Limited by body habitus and inability to stand upright. Moderate esophageal dysmotility, likely presbyesophagus. An area of persistent moderate underdistention could be technique related or represent a true stricture. This could either be further evaluated with endoscopy or more completely characterized when patient is clinically stable and able to be evaluated by outpatient double contrast esophagram. Electronically Signed   By: KAbigail MiyamotoM.D.   On: 05/16/2022 16:14     Scheduled Meds:  amiodarone   200 mg Oral Daily   apixaban  5 mg Oral BID   budesonide (PULMICORT) nebulizer solution  0.5 mg Nebulization BID   DULoxetine  60 mg Oral Daily   feeding supplement  237 mL Oral BID BM   folic acid  1 mg Oral Daily   furosemide  40 mg Oral Daily   lactulose  20 g Oral Daily   levothyroxine  75 mcg Oral Q0600   metoprolol tartrate  12.5 mg Oral BID   midodrine  10 mg Oral BID WC   multivitamin with minerals  1 tablet Oral Daily   potassium chloride  20 mEq Oral Daily   rosuvastatin  10 mg Oral Daily   Continuous Infusions:  sodium chloride Stopped (04/30/22 0410)     LOS: 20 days    Time spent: 344m    PrDomenic PoliteMD [facility]   05/18/2022, 11:36 AM

## 2022-05-18 NOTE — Progress Notes (Signed)
                                                     Palliative Care Progress Note, Assessment & Plan   Patient Name: Yolanda [name]       Date: 05/18/2022 DOB: 18-Jul-1955  Age: 66 y.o. MRN#: [id number] Attending Physician: Domenic Polite, MD Primary Care Physician: Celene Squibb, MD Admit Date: 04/28/2022  Reason for Consultation/Follow-up: Establishing goals of care  Summary of counseling/coordination of care: After reviewing the patient's chart and assessing the patient at bedside, I discussed plan of care with patient.  She is looking forward to being discharged.  She believes she will be discharged tomorrow.  We discussed importance of functional, nutritional, and cognitive status to improve.  Patient is excepting of skilled nursing facility to help her rehab and gain strength.  She has no acute complaints.  Symptom burden is low.  Advanced directives and MOST forms remain uncompleted at patient's bedside.  I reminded patient she has agency over her advance care planning.  Patient states she wants to continue to have conversations with both daughters in regards to her advanced care planning documentation.  Therapeutic silence and active listening provided for patient to share her thoughts and emotions regarding current medical situation.  Emotional support provided.   Goals are clear.  DNR remains.  Plan is for patient to have ongoing discussions with both daughters in regards to goals of care.  PMT will remain available to patient and her family during her hospitalization.  PMT will monitor peripherally and shadow the patient's chart.  Patient and family have PMT contact information.  They were encouraged to reach out with any palliative needs.  Please reengage with PMT if goals change, at patient/family's request, or if patient's health  deteriorates during hospitalization.  Physical Exam Vitals reviewed.  Constitutional:      General: She is not in acute distress.    Appearance: She is obese. She is not ill-appearing.  HENT:     Mouth/Throat:     Mouth: Mucous membranes are moist.  Eyes:     Pupils: Pupils are equal, round, and reactive to light.  Cardiovascular:     Rate and Rhythm: Normal rate.     Pulses: Normal pulses.  Pulmonary:     Effort: Pulmonary effort is normal.  Abdominal:     Palpations: Abdomen is soft.  Musculoskeletal:     Comments: Generalized weakness  Neurological:     Mental Status: She is alert.  Psychiatric:        Mood and Affect: Mood normal.        Behavior: Behavior normal.        Judgment: Judgment normal.             Palliative Assessment/Data: 50-60%    Total Time 25 minutes  Greater than 50%  of this time was spent counseling and coordinating care related to the above assessment and plan.  Thank you for allowing the Palliative Medicine Team to assist in the care of this patient.  Mamou Ilsa Iha, FNP-BC Palliative Medicine Team Team Phone # [phone]

## 2022-05-18 NOTE — Progress Notes (Signed)
   05/18/22 1500  Mobility  Activity Ambulated with assistance to bathroom  Level of Assistance Minimal assist, patient does 75% or more  Assistive Device Front wheel walker  Distance Ambulated (ft) 10 ft  Activity Response Tolerated well  Mobility Referral Yes  $Mobility charge 1 Mobility   Mobility Specialist Progress Note  Pt in chair and requesting to go to the BR for a BM.  Was told to pull the bathroom alarm when finished.   Lucious Groves Mobility Specialist

## 2022-05-19 DIAGNOSIS — I5033 Acute on chronic diastolic (congestive) heart failure: Secondary | ICD-10-CM | POA: Diagnosis not present

## 2022-05-19 LAB — BASIC METABOLIC PANEL
Anion gap: 6 (ref 5–15)
BUN: 22 mg/dL (ref 8–23)
CO2: 25 mmol/L (ref 22–32)
Calcium: 10.2 mg/dL (ref 8.9–10.3)
Chloride: 105 mmol/L (ref 98–111)
Creatinine, Ser: 2.32 mg/dL — ABNORMAL HIGH (ref 0.44–1.00)
GFR, Estimated: 22 mL/min — ABNORMAL LOW (ref >60)
Glucose, Bld: 82 mg/dL (ref 70–99)
Potassium: 3.9 mmol/L (ref 3.5–5.1)
Sodium: 136 mmol/L (ref 135–145)

## 2022-05-19 LAB — SARS CORONAVIRUS 2 (TAT 6-24 HRS): SARS Coronavirus 2: NEGATIVE

## 2022-05-19 MED ORDER — METOPROLOL TARTRATE 25 MG PO TABS: 12.5000 mg | ORAL_TABLET | Freq: Two times a day (BID) | ORAL | Status: AC

## 2022-05-19 MED ORDER — AMIODARONE HCL 200 MG PO TABS: 200.0000 mg | ORAL_TABLET | Freq: Every day | ORAL | Status: AC

## 2022-05-19 MED ORDER — APIXABAN 5 MG PO TABS: 5.0000 mg | ORAL_TABLET | Freq: Two times a day (BID) | ORAL | Status: AC

## 2022-05-19 MED ORDER — MIDODRINE HCL 10 MG PO TABS: 10.0000 mg | ORAL_TABLET | Freq: Two times a day (BID) | ORAL | Status: AC

## 2022-05-19 MED ORDER — FOLIC ACID 1 MG PO TABS: 1.0000 mg | ORAL_TABLET | Freq: Every day | ORAL | Status: AC

## 2022-05-19 MED ORDER — POTASSIUM CHLORIDE ER 20 MEQ PO TBCR: 20.0000 meq | EXTENDED_RELEASE_TABLET | Freq: Every day | ORAL | Status: AC

## 2022-05-19 NOTE — Discharge Summary (Addendum)
Physician Discharge Summary  Yolanda [name] QPY:[id number] DOB: 01-31-1956 DOA: 04/28/2022  PCP: Celene Squibb, MD  Admit date: 04/28/2022 Discharge date: 05/19/2022  Time spent: 45 minutes  Recommendations for Outpatient Follow-up:  PCP Dr. Nevada Crane in 1 week, please check BMP at follow-up,  Gastroenterology Dr. Hurshel Keys in 2 to 3 weeks  3.   Outpatient, palliative care follow-up  Discharge Diagnoses:  Principal Problem:   Acute on chronic diastolic CHF (congestive heart failure) (HCC) Acute hypoxic respiratory failure Decompensated liver cirrhosis Hepatic encephalopathy Aspiration pneumonia   Essential hypertension   Paroxysmal atrial fibrillation with RVR (HCC)   AKI (acute kidney injury) ([place])   Liver cirrhosis secondary to NASH ([place])   GERD (gastroesophageal reflux disease)   Hypothyroidism   Type 2 diabetes mellitus with hyperlipidemia (HCC)   Class 3 obesity (HCC)   Protein-calorie malnutrition, moderate (HCC)   Acute respiratory failure with hypoxia ([place])   Pulmonary edema   Typical atrial flutter ([place]) DNR  Discharge Condition: Fair  Diet recommendation: Low-sodium, heart healthy, diabetic  Filed Weights   05/17/22 0355 05/18/22 0540 05/19/22 0545  Weight: 107.9 kg 107.6 kg 107.3 kg    History of present illness:  66/F chronically ill with [name] cirrhosis, diastolic CHF, obesity, PE not on AC, A-fib not on AC, chronic respiratory failure on 3L [place] O2,  OSA, hypertension, anxiety, depression, GERD, rheumatoid arthritis, hypothyroidism.  She was recently admitted at [facility] 10/11-10/19 with AMS and acute cystitis.   Work-up was negative for PE, found to have new onset Afib, multi-focal PNA and effusion and was diuresed and discharged home on 10/19 on antibiotics. -Return to the ED 10/20 with worsening shortness of breath and hypoxia, placed on BiPAP, CXR again showed multi-focal bilateral infiltrates, restarted antibiotics and diuretics on 10/22 her respiratory  status began to decline despite bipap and PCCM was consulted for transfer to the [facility] Course:   Acute on Chronic Hypoxic Respiratory Failure secondary to Multifocal PNA and CHF Acute on chronic HFpEF with pulmonary edema -Was admitted to the ICU with respiratory failure, intubated 10/22, extubated 10/25 -Concern for recurrent aspiration, possibly esophageal, SLP eval noted -Completed 7 days of IV Zosyn on 10/29 -Diuresed as well, see discussion below -Pulmonary toilet, aspiration precautions, weaned O2 down -Activity, PT eval-now SNF recommended -Palliative care follow-up recommended   Dysphagia -Barium esophagram x2 , second exam with concern for mod esoph dysmotility -Aspiration precautions -Tolerating current diet at this time   Acute on chronic diastolic CHF Decompensated liver cirrhosis with fluid overload -Echo 10/23 with EF of 12-45%, grade 1 diastolic dysfunction, preserved RV function -Diuresed with IV Lasix, now euvolemic -Albumin is low at baseline with liver disease, only 1.7 unfortunately-poor prognosis -diuretics then held w/ AKI, hydrated, GDMT limited by hypotension and CKD -Blood pressure improved, on midodrine, restarted Lasix 40 Mg daily, would benefit from Aldactone as well when kidney function improves   Atrial Fibrillation with RVR  -Required IV amiodarone bolus multiple times -Continue  Eliquis -was on Amio gtt., cardiology consulting, now PO amio and metoprolol   AKI  -creat normal on admit, trended up to 3.5 after about 10 days of IV lasix. Afib RVR -renal US w/o obstruction.appreciate  renal input - diuretics were then held, hydrated with saline per nephrology recommendations ,  recommended to resume diuretics today, Lasix 58m daily, hopefully Aldactone down the road when kidney function improves further   hypokalemia -Replaced   Acute metabolic encephalopathy.   -Suspect mixed picture of  post infection, also hepatic encephalopathic  component -Improving, completed antibiotic course, repeat ammonia level was normal -Continue lactulose   NASH liver cirrhosis  -Continue lactulose, resume Rifaximin when possible -Diuretics as above   Type 2 DM -Holding metformin continue sliding scale insulin   Hypothyroidism -Continue levothyroxine   GERD -Esophagram PPI and Carafate   Class 3 obesity (HCC) Calculated BMI is 44.3 Patient with very poor physical functional status, uses a walker for ambulation.    Depression and anxiety,  Continue with daily duloxetine.  Discontinued alprazolam.    Code Status: DNR  Consultants: Pulmonary, cardiology, nephrology  Discharge Exam: Vitals:   05/19/22 0757 05/19/22 0808  BP: (!) 94/48   Pulse: 75   Resp:    Temp: 97.9 F (36.6 C)   SpO2: 96% 95%   General exam: Pleasant chronically ill female sitting up in bed, AAO x3 HEENT: Neck obese unable to assess JVD CVS: S1-S2, regular rhythm Lungs: Decreased breath sounds bases Abdomen: Soft, nontender, bowel sounds present Extremities: No edema  Skin: No rashes Psychiatry:  Mood & affect appropriate.     Discharge Instructions   Discharge Instructions     Diet - low sodium heart healthy   Complete by: As directed    Increase activity slowly   Complete by: As directed       Allergies as of 05/19/2022       Reactions   Azithromycin Hives   Lisinopril Cough   Morphine Hives   Sulfa Antibiotics Other (See Comments)   Yeast infection        Medication List     STOP taking these medications    amoxicillin-clavulanate 875-125 MG tablet Commonly known as: AUGMENTIN   clotrimazole 10 MG troche Commonly known as: MYCELEX   diltiazem 120 MG 24 hr capsule Commonly known as: Cardizem CD   doxycycline 100 MG tablet Commonly known as: VIBRA-TABS   meclizine 25 MG tablet Commonly known as: ANTIVERT   pilocarpine 5 MG tablet Commonly known as: SALAGEN   rifaximin 550 MG Tabs tablet Commonly known  as: XIFAXAN   spironolactone 50 MG tablet Commonly known as: Aldactone   sucralfate 1 g tablet Commonly known as: CARAFATE       TAKE these medications    acetaminophen 325 MG tablet Commonly known as: TYLENOL Take 2 tablets (650 mg total) by mouth every 6 (six) hours as needed for mild pain, headache or fever (or Fever >/= 101).   albuterol 108 (90 Base) MCG/ACT inhaler Commonly known as: VENTOLIN HFA Inhale 1-2 puffs into the lungs every 6 (six) hours as needed for wheezing or shortness of breath.   amiodarone 200 MG tablet Commonly known as: PACERONE Take 1 tablet (200 mg total) by mouth daily. Start taking on: May 20, 2022   apixaban 5 MG Tabs tablet Commonly known as: ELIQUIS Take 1 tablet (5 mg total) by mouth 2 (two) times daily.   aspirin EC 81 MG tablet Take 81 mg by mouth daily. Swallow whole.   blood glucose meter kit and supplies Dispense based on patient and insurance preference. Use up to four times daily as directed. (FOR ICD-10 E10.9, E11.9).   budesonide-formoterol 80-4.5 MCG/ACT inhaler Commonly known as: Symbicort Take 2 puffs first thing in am and then another 2 puffs about 12 hours later. What changed:  how much to take how to take this when to take this   CALCIUM 600 + D PO Take 1 tablet by mouth daily.   DULoxetine 60  MG capsule Commonly known as: CYMBALTA Take 60 mg by mouth daily.   folic acid 1 MG tablet Commonly known as: FOLVITE Take 1 tablet (1 mg total) by mouth daily. Start taking on: May 20, 2022   furosemide 40 MG tablet Commonly known as: Lasix Take 1 tablet (40 mg total) by mouth daily.   lactulose (encephalopathy) 10 GM/15ML Soln Commonly known as: CHRONULAC Take 30 mLs (20 g total) by mouth 2 (two) times daily. Goal is 2 to 3 soft/mushy BMs, Ok to take additional dose if No BM in 24 hrs   levothyroxine 75 MCG tablet Commonly known as: SYNTHROID Take 75 mcg by mouth daily.   Melatonin 10 MG Caps Take 10  mg by mouth at bedtime as needed (sleep).   metFORMIN 500 MG 24 hr tablet Commonly known as: GLUCOPHAGE-XR Take 1 tablet (500 mg total) by mouth 2 (two) times daily with a meal.   metoprolol tartrate 25 MG tablet Commonly known as: LOPRESSOR Take 0.5 tablets (12.5 mg total) by mouth 2 (two) times daily. What changed: how much to take   midodrine 10 MG tablet Commonly known as: PROAMATINE Take 1 tablet (10 mg total) by mouth 2 (two) times daily with a meal. What changed:  medication strength how much to take   pantoprazole 40 MG tablet Commonly known as: PROTONIX Take 40 mg by mouth 2 (two) times daily.   Potassium Chloride ER 20 MEQ Tbcr Take 20 mEq by mouth daily. Take While taking Lasix/furosemide What changed:  medication strength how much to take   REMICADE IV Inject 1 Syringe into the vein every 3 (three) months.   rosuvastatin 10 MG tablet Commonly known as: CRESTOR Take 10 mg by mouth daily.   Vitamin D3 50 MCG (2000 UT) Tabs Take 2,000 Units by mouth daily.       Allergies  Allergen Reactions   Azithromycin Hives   Lisinopril Cough   Morphine Hives   Sulfa Antibiotics Other (See Comments)    Yeast infection    Contact information for follow-up providers     [facility] Follow up.   Why: Outpatient Palliative Services- office to call to arrange visit times. Contact information: [address] [place] [address] [phone]         [facility] Follow up.   Why: Registered Nurse, Physical and Occupational Therapy, Social Wor, Aide-office to call with visit times. Contact information: [address] [place] [address] [phone]              Contact information for after-discharge care     [facility] .   Service: Skilled Nursing Contact information: [address] [place] [phone]                       The results of significant diagnostics from this hospitalization (including imaging, microbiology, ancillary and laboratory) are listed below for reference.    Significant Diagnostic Studies: DG ESOPHAGUS W SINGLE CM (SOL OR THIN BA)  Result Date: 05/16/2022 CLINICAL DATA:  66 year old female with history of NASH cirrhosis, chronic respiratory failure, aspiration pneumonia who presents with ongoing dysphagia. Patient had single-contrast esophagus on 05/05/2022 and 05/09/2022 due to dysphagia, exam is for limited due to patient mobility and metabolic encephalopathy. The encephalopathy has resolved, request for repeat esophagram for further evaluation. EXAM: ESOPHAGUS/BARIUM SWALLOW/TABLET STUDY TECHNIQUE: Single contrast examination was  performed using thin liquid barium. This exam was performed by Durenda Guthrie, PA-C, and was supervised and interpreted by Abigail Miyamoto, MD. FLUOROSCOPY: Radiation Exposure Index (as provided by the fluoroscopic device): 35.2 mGy Kerma COMPARISON:  None Available. FINDINGS: Limited examination due to patient's inability to stand. Swallowing: Patient drank contrast in left lateral decubitus position, swallowing appears normal. No vestibular penetration or aspiration seen. Pharynx: Exam limited due to patient's body habitus, visualized pharynx appears normal. Esophagus: The very distal esophagus could not be fully distended, possibly due to technique. Cannot exclude stricture including on 28/27. Esophageal motility: Esophageal motility checked in LPO position. moderate esophageal dysmotility with tertiary contraction. Patient had moderate amount of residual thin barium at the level of T3-T4, no obvious reflux into pharynx seen, but patient coughed multiple times during the examination. Gastroesophageal reflux: None visualized. Ingested 16m barium tablet: 13 mm barium tablet became stuck at GE junction for less than 1 minute, if passed freely into the stomach when patient  drank extra sips of thin barium. Other: None. IMPRESSION: Limited exam with patient in LPO position. Limited by body habitus and inability to stand upright. Moderate esophageal dysmotility, likely presbyesophagus. An area of persistent moderate underdistention could be technique related or represent a true stricture. This could either be further evaluated with endoscopy or more completely characterized when patient is clinically stable and able to be evaluated by outpatient double contrast esophagram. Electronically Signed   By: KAbigail MiyamotoM.D.   On: 05/16/2022 16:14   DG CHEST PORT 1 VIEW  Result Date: 05/14/2022 CLINICAL DATA:  Renal dysfunction, difficulty breathing EXAM: PORTABLE CHEST 1 VIEW COMPARISON:  Previous studies including the examination of 05/05/2022 FINDINGS: Transverse diameter of heart is increased. There is interval decrease in pulmonary vascular congestion and improvement in aeration of both lungs. There is interval decrease in bilateral pleural effusions. Bilateral small residual pleural effusions are seen, more so on the right side. There is no pneumothorax. There is interval removal of central venous catheter. IMPRESSION: Cardiomegaly. There is interval decrease in pulmonary vascular congestion and pulmonary edema. There is interval decrease in bilateral pleural effusions with small bilateral residual effusions. Electronically Signed   By: PElmer PickerM.D.   On: 05/14/2022 12:03   [place]RENAL  Result Date: 05/14/2022 CLINICAL DATA:  Renal dysfunction EXAM: RENAL / URINARY TRACT ULTRASOUND COMPLETE COMPARISON:  Sonogram done on 03/01/2022, CT done on 04/24/2022 FINDINGS: Right Kidney: Renal measurements: 12.8 x 6.8 x 6.6 cm = volume: 300.1 mL. There is no hydronephrosis. There is increased cortical echogenicity. Left Kidney: Renal measurements: 12.3 x 6.9 x 6.5 cm = volume: 287.7 mL. There is no hydronephrosis. There is increased cortical echogenicity. Bladder: Not  sonographically visualized. Other: None. IMPRESSION: There is no hydronephrosis. There is increased cortical echogenicity suggesting medical renal disease. Electronically Signed   By: PElmer PickerM.D.   On: 05/14/2022 12:00   DG Foot Complete Right  Result Date: 05/10/2022 CLINICAL DATA:  Fall, right foot pain EXAM: RIGHT FOOT COMPLETE - 3+ VIEW COMPARISON:  None Available. FINDINGS: Normal alignment. No acute fracture or dislocation. Small superior and plantar calcaneal spurs are present. Joint spaces are preserved. Soft tissues are unremarkable. IMPRESSION: 1. No acute fracture or dislocation. Electronically Signed   By: AFidela SalisburyM.D.   On: 05/10/2022 00:42   DG Knee 1-2 Views Left  Result Date: 05/10/2022 CLINICAL DATA:  Fall, left knee pain EXAM: LEFT KNEE - 1-2 VIEW COMPARISON:  None Available. FINDINGS: Normal alignment. No  acute fracture or dislocation. Mild lateral and minimal patellofemoral compartment degenerative arthritis is present. Trace effusion. Soft tissues are otherwise unremarkable. IMPRESSION: 1. Mild lateral and minimal patellofemoral compartment degenerative arthritis. Electronically Signed   By: Fidela Salisbury M.D.   On: 05/10/2022 00:41   DG ESOPHAGUS W SINGLE CM (SOL OR THIN BA)  Result Date: 05/09/2022 CLINICAL DATA:  Dysphagia EXAM: ESOPHAGUS/BARIUM SWALLOW/TABLET STUDY TECHNIQUE: Single contrast examination was performed using thin liquid barium. This exam was performed by Narda Rutherford, NP, and was supervised and interpreted by Zetta Bills, MD. FLUOROSCOPY: Radiation Exposure Index (as provided by the fluoroscopic device): 114.60 mGy Kerma COMPARISON:  Esophagram study 05/05/2022 FINDINGS: Limited examination due to patient's inability to stand and limited ability to reposition on the fluoroscopy table. Swallowing: Appears normal. No vestibular penetration or aspiration seen. Mild to moderate CP bar. Pharynx: Unremarkable. Esophagus: Mild narrowing noted near  gastroesophageal junction. Esophageal motility: Mild esophageal dysmotility noted. Hiatal Hernia: Small hiatal hernia. Gastroesophageal reflux: Moderate gastroesophageal reflux to proximal esophagus. Ingested 47m barium tablet: 13 mm barium tablet stopped in the lower esophagus for approximately 7 minutes, then eventually passed with multiple swallows of water and thin barium into the stomach. It is unclear whether this was due to stasis or secondary to true narrowing. Unable to further assess due to patient's inability to change position without assistance. Other: None. IMPRESSION: Small hiatal hernia. Mild narrowing suspected near the gastroesophageal junction. This cannot be further assessed due to patient condition. Could consider follow-up as an outpatient when the patient is better able to move and stand if this is an option. Otherwise endoscopy could be considered on follow-up for complete assessment. Moderate gastroesophageal reflux to proximal esophagus. Read by: SNarda Rutherford AGNP-BC Electronically Signed   By: GZetta BillsM.D.   On: 05/09/2022 16:56   DG ESOPHAGUS W SINGLE CM (SOL OR THIN BA)  Result Date: 05/05/2022 CLINICAL DATA:  Provided history: Dysphagia. Additional history provided: History of NASH cirrhosis, chronic respiratory failure, aspiration pneumonia. EXAM: ESOPHAGUS/BARIUM SWALLOW/TABLET STUDY TECHNIQUE: A single contrast examination was performed using thin liquid barium. The exam was performed by JRushie NyhanNP, and was supervised and interpreted by Dr. KKellie Simmering FLUOROSCOPY: Fluoroscopy time: 2 minutes, 54 seconds (46.2 mGy). COMPARISON:  Modified barium swallow 05/04/2022. Esophagram 04/13/2017. FINDINGS: Limited examination due to the patient's inability to stand and limited ability to reposition on the fluoroscopy table. Additionally, the patient had difficulty drinking contrast and vomited during the examination. A barium tablet could not be administered. The very  distal esophagus could not be fully distended. Elsewhere, the esophagus appears normal in caliber and smooth in contour. Moderate intermittent esophageal dysmotility with tertiary contractions. No appreciable hiatal hernia. No gastroesophageal reflux was observed. IMPRESSION: Limited examination due to the patient's inability to stand and limited ability to reposition on the fluoroscopy table. Additionally, the patient had difficulty drinking contrast and vomited during the examination. A barium tablet could not be administered. The very distal esophagus could not be fully distended, and a mild smooth distal esophageal stricture cannot be excluded. Consider a repeat examination when the patient is better able to tolerate the study. Alternatively, consider endoscopy for further evaluation. Moderate esophageal dysmotility with tertiary contractions. Electronically Signed   By: KKellie SimmeringD.O.   On: 05/05/2022 10:53   DG Chest Port 1 View  Result Date: 05/05/2022 CLINICAL DATA:  Pulmonary edema EXAM: PORTABLE CHEST 1 VIEW COMPARISON:  Radiograph 05/03/2022 FINDINGS: Unchanged cardiomegaly. Left upper extremity PICC tip overlies the mid  SVC. Defibrillator pads overlie the left chest. There are diffuse interstitial and alveolar opacities. There is a small right and trace left pleural effusion. No evidence of pneumothorax. No acute osseous abnormality. Bilateral shoulder degenerative change. Thoracic spondylosis. IMPRESSION: Persistent multifocal pneumonia and/or edema with bilateral pleural effusions, right greater than left, not significant changed from prior. Electronically Signed   By: Maurine Simmering M.D.   On: 05/05/2022 08:39   DG Swallowing Func-Speech Pathology  Result Date: 05/04/2022 Table formatting from the original result was not included. Objective Swallowing Evaluation: Type of Study: MBS-Modified Barium Swallow Study  Patient Details Name: MARICRUZ LUCERO MRN: [id number] Date of Birth:  26-Jan-1956 Today's Date: 05/04/2022 Time: SLP Start Time (ACUTE ONLY): 1430 -SLP Stop Time (ACUTE ONLY): 9449 SLP Time Calculation (min) (ACUTE ONLY): 17 min Past Medical History: Past Medical History: Diagnosis Date  Abnormal uterine bleeding   Anxiety   Arthritis   Chronic abdominal pain   Chronic pain in left foot   Depression   Elevated liver function tests 2018  Endometriosis   Fibromyalgia   GERD 12/21/2009  Qualifier: Diagnosis of  By: Craige Cotta    Hypertension   stopped meds in Aug 2015  Hypothyroidism   IBS (irritable bowel syndrome)   Internal hemorrhoids   Kidney stone   Liver cirrhosis secondary to NASH ([place])   Obesity, morbid (HCC)   PONV (postoperative nausea and vomiting)   Psoriatic arthritis ([place])   Pulmonary embolism ([place]) 12/2005  Qualifier: Diagnosis of  By: Kellie Simmering LPN, Almyra Free    Rheumatoid arthritis ([place])   Sleep apnea   Vitamin D deficiency  Past Surgical History: Past Surgical History: Procedure Laterality Date  ABDOMINAL SURGERY    laparoscopy  BIOPSY N/A 03/24/2014  Procedure: GASTRIC BIOPSIES;  Surgeon: Danie Binder, MD;  Location: [facility];  Service: Endoscopy;  Laterality: N/A;  BIOPSY  10/28/2019  Procedure: BIOPSY;  Surgeon: Danie Binder, MD;  Location: [facility];  Service: Endoscopy;;  gastric nodule  BREAST LUMPECTOMY Right   CESAREAN SECTION    X2  COLONOSCOPY  2006  internal hemorrhoids  COLONOSCOPY WITH PROPOFOL N/A 03/24/2014  Dr. [name]: 2 tubular adenomas removed, hemorrhoids  COLONOSCOPY WITH PROPOFOL N/A 10/28/2019  Fields: External and internal hemorrhoids, 8 polyps ranging from 2 to 5 mm in size removed from the colon.  Multiple tubular adenomas.  Next colonoscopy in 3 years.  CYSTOSCOPY W/ RETROGRADES  01/23/2012  Procedure: CYSTOSCOPY WITH RETROGRADE PYELOGRAM;  Surgeon: Marissa Nestle, MD;  Location: [facility];  Service: Urology;  Laterality: Left;  DILATATION & CURETTAGE/HYSTEROSCOPY WITH MYOSURE N/A 11/18/2014  Procedure: DILATATION & CURETTAGE/HYSTEROSCOPY WITH  MYOSURE, resection of polyp;  Surgeon: Cheri Fowler, MD;  Location: [facility];  Service: Gynecology;  Laterality: N/A;  DILATION AND CURETTAGE OF UTERUS    ESOPHAGOGASTRODUODENOSCOPY   08/24/2006  Dr. Veto Kemps erythema of the antrum without erosion or ulcers/Otherwise, normal esophagus without evidence of Barrett's path with chronic gastritis  ESOPHAGOGASTRODUODENOSCOPY (EGD) WITH PROPOFOL N/A 03/24/2014  Dr. [name]: gastritis  ESOPHAGOGASTRODUODENOSCOPY (EGD) WITH PROPOFOL N/A 10/28/2019  Fields: Esophagus appeared normal, empiric dilation due to history of dysphagia.  6 mm nodule seen in the gastric cardia.  Mild portal hypertensive gastropathy.  Nodule from the stomach biopsied and showed mild chronic gastritis, no H. pylori.  POLYPECTOMY N/A 03/24/2014  Procedure: POLYPECTOMY;  Surgeon: Danie Binder, MD;  Location: [facility];  Service: Endoscopy;  Laterality: N/A;  POLYPECTOMY  10/28/2019  Procedure: POLYPECTOMY;  Surgeon: Danie Binder,  MD;  Location: [facility];  Service: Endoscopy;;  hepatic flexure, ascending colon,sigmoid colon, rectal  REMOVAL OF STONES  01/23/2012  Procedure: REMOVAL OF STONES;  Surgeon: Marissa Nestle, MD;  Location: [facility];  Service: Urology;  Laterality: N/A;  SAVORY DILATION N/A 10/28/2019  Procedure: SAVORY DILATION;  Surgeon: Danie Binder, MD;  Location: [facility];  Service: Endoscopy;  Laterality: N/A;  TUBAL LIGATION   HPI: Pt is a 66 y.o. female who presented with SOB and fatigue. CXR on admission revealed multifocal pneumonia. ETT 10/22-10/25. Family reported to ordering MD that pt has a fairly significant history of poorly controlled reflux, choking after meals, and at times choking waking up at night. Plan for esopghagram when pt is medically stable. Pt recently admitted at [facility] 10/11-10/19 with AMS and acute cystitis. Pt found to have new onset Afib, multi-focal PNA and effusion and was diuresed and discharged home. PMH: cirrhosis secondary to NASH with recent  hepatic encephalopathy, PAF not on anticoagulation, chronic HFpEF, recent multifocal pneumonia, hypothyroidism, GERD, morbid obesity, COPD Gold stage II, esophagram 04/13/17 WNL. EGD 10/28/19: Esophagus appeared normal, empiric dilation due to history of dysphagia.  6 mm nodule seen in the gastric cardia.  Mild portal hypertensive gastropathy.  Nodule from the stomach biopsied and showed mild chronic gastritis, no H. pylori.  No data recorded  Recommendations for follow up therapy are one component of a multi-disciplinary discharge planning process, led by the attending physician.  Recommendations may be updated based on patient status, additional functional criteria and insurance authorization. Assessment / Plan / Recommendation   05/04/2022   3:34 PM Clinical Impressions Clinical Impression Pt's oropharyngeal swallow mechanism was within functional limits with minimal vallecular and pyriform sinus residue which was cleared with a liquid wash or with pt's independent use of secondary swallows. An episode of regurgitation (thoracic esophagus to hyopharynx) was noted when pt swallowed the 5m barium tablet with thin liquids . Aspiration was not observed during this episode, but based on reports from the pt and her daughter, and the pt's pneumonia, SLP questions whether these episodes intermittently result in aspiration. Esophageal visualization revealed slowed movement of solids in the upper thoracic esophagus and halted movement of the barium tablet at the GE junction which was facilitated with an additional bolus of thin liquids. SLP is in agreement with esophageal assessment. Pt may have up to regular texture solids and thin liquids from an oropharyngeal standpoint; pt's case discussed with PCCM (i.e., Dr. STamala Julianand PSalvadore Dom NP) and it was agreed to advance pt's diet. SLP will follow briefly to ensure tolerance of the advanced diet. SLP Visit Diagnosis Dysphagia, unspecified (R13.10) Impact on safety and  function Mild aspiration risk     05/04/2022   3:34 PM Treatment Recommendations Treatment Recommendations No treatment recommended at this time     05/04/2022   3:34 PM Prognosis Barriers to Reach Goals Time post onset;Severity of deficits   05/04/2022   3:34 PM Diet Recommendations SLP Diet Recommendations Regular solids;Thin liquid Liquid Administration via Cup;Straw Medication Administration Whole meds with puree Compensations Slow rate;Small sips/bites Postural Changes Seated upright at 90 degrees;Remain semi-upright after after feeds/meals (Comment)     05/04/2022   3:34 PM Other Recommendations Recommended Consults Consider esophageal assessment Oral Care Recommendations Oral care BID Follow Up Recommendations No SLP follow up Functional Status Assessment Patient has had a recent decline in their functional status and demonstrates the ability to make significant improvements in function in a reasonable and  predictable amount of time.   05/03/2022   3:30 PM Frequency and Duration  Speech Therapy Frequency (ACUTE ONLY) min 2x/week Treatment Duration 2 weeks     05/04/2022   3:34 PM Oral Phase Oral Phase [facility]    05/04/2022   3:34 PM Pharyngeal Phase Pharyngeal Phase [facility]    05/04/2022   3:34 PM Cervical Esophageal Phase  Cervical Esophageal Phase [facility] Shanika I. Hardin Negus, [place], [place] Office number [phone] Horton Marshall 05/04/2022, 4:10 PM                     DG Chest Port 1 View  Result Date: 05/03/2022 CLINICAL DATA:  66 year old female presents for evaluation of ETT placement, respiratory failure. EXAM: PORTABLE CHEST 1 VIEW COMPARISON:  April 30, 2022. FINDINGS: ETT has likely been advanced. Tip below clavicular heads perhaps as low as 1.6-2 cm from the carina. Gastric tube courses through in off the field of the radiograph. EKG leads project over the chest. LEFT-sided central venous access device terminating in the low superior vena cava. Cardiomediastinal contours  and hilar structures are stable. Improved interstitial and airspace opacities with basilar greater than mid and upper chest airspace and interstitial changes. Clearing more so in the upper chest bilaterally. Process worse on the RIGHT. No pneumothorax. On limited assessment there is no acute skeletal finding. IMPRESSION: 1. ETT has likely been advanced. Tip below clavicular heads perhaps as low as 1.6-2 cm from the carina. Correlation with tube function is suggested with perhaps slight retraction approximately 1 cm considered. Low position may be accentuated in part by projection on the current exam. 2. Gastric tube courses through in off the field of the radiograph. 3. Improved interstitial and airspace opacities with basilar greater than mid and upper chest airspace and interstitial changes. These results will be called to the ordering clinician or representative by the Radiologist Assistant, and communication documented in the PACS or [employer]. Electronically Signed   By: Zetta Bills M.D.   On: 05/03/2022 08:33   DG CHEST PORT 1 VIEW  Result Date: 04/30/2022 CLINICAL DATA:  Central line placement. EXAM: PORTABLE CHEST 1 VIEW COMPARISON:  Chest radiograph dated 04/30/2022. FINDINGS: Endotracheal tube with tip approximately 5.7 cm above the carina. Enteric tube extends below the diaphragm with tip beyond the inferior margin of the image. Left subclavian central venous line with tip over central SVC. Bilateral pulmonary opacities relatively similar to prior radiograph. Small pleural effusions may be present. No pneumothorax. Stable cardiomediastinal silhouette. No acute osseous pathology. IMPRESSION: 1. Endotracheal tube above the carina. 2. Left subclavian central venous line with tip over central SVC. No pneumothorax. 3. Similar bilateral pulmonary opacities. Electronically Signed   By: Anner Crete M.D.   On: 04/30/2022 18:43   DG Abd 1 View  Result Date: 04/30/2022 CLINICAL DATA:  NG  tube placement. EXAM: ABDOMEN - 1 VIEW COMPARISON:  None Available. FINDINGS: An enteric tube is noted with tip overlying the distal stomach. Bowel gas pattern is unremarkable. Diffuse bilateral airspace opacities in the LOWER lungs noted. IMPRESSION: Enteric tube with tip overlying the distal stomach. Electronically Signed   By: Margarette Canada M.D.   On: 04/30/2022 15:53   DG CHEST PORT 1 VIEW  Result Date: 04/30/2022 CLINICAL DATA:  Respiratory failure.  Intubation. EXAM: PORTABLE CHEST 1 VIEW COMPARISON:  04/30/2022 and prior studies FINDINGS: Endotracheal tube with tip 5 cm above the carina and enteric tube entering the stomach with tip off field  of view noted. Diffuse bilateral airspace opacities are not significantly changed. There is no evidence of pneumothorax. Small pleural effusions are not excluded. IMPRESSION: Support apparatus as described. Otherwise unchanged appearance of the chest with diffuse bilateral airspace opacities. Electronically Signed   By: Margarette Canada M.D.   On: 04/30/2022 15:52   DG Chest 1 View  Result Date: 04/30/2022 CLINICAL DATA:  Congestive heart failure. EXAM: CHEST  1 VIEW COMPARISON:  One-view chest x-ray 04/28/2022 FINDINGS: Heart is enlarged. Progressive diffuse interstitial and airspace opacities are present bilaterally. Bilateral pleural effusions are present. IMPRESSION: Progressive diffuse interstitial and airspace opacities bilaterally compatible with multifocal pneumonia. Edema from congestive heart failure likely contributes. Electronically Signed   By: San Morelle M.D.   On: 04/30/2022 12:54   CT CHEST WO CONTRAST  Result Date: 04/28/2022 CLINICAL DATA:  Worsening dyspnea; recent hospital stay with multifocal pneumonia EXAM: CT CHEST WITHOUT CONTRAST TECHNIQUE: Multidetector CT imaging of the chest was performed following the standard protocol without IV contrast. RADIATION DOSE REDUCTION: This exam was performed according to the departmental  dose-optimization program which includes automated exposure control, adjustment of the mA and/or kV according to patient size and/or use of iterative reconstruction technique. COMPARISON:  Radiographs earlier today and CTA chest 04/23/2022 FINDINGS: Cardiovascular: Stable heart size. New small pericardial effusion. Coronary artery and aortic atherosclerotic calcification. Mediastinum/Nodes: Stable subcentimeter mediastinal lymph nodes. Thyroid and esophagus are unremarkable. Lungs/Pleura: Small right pleural effusion, decreased from CT 04/23/2022. Similar trace left pleural effusion. Diffuse right-greater-than-left ground-glass opacities and interstitial thickening have increased since 04/23/2022. Compressive atelectasis in the lower lobes. No pneumothorax. Upper Abdomen: Nodular liver contour compatible with cirrhosis. Question splenomegaly. Multiple borderline enlarged upper retroperitoneal nodes. No acute abnormality. Musculoskeletal: No acute abnormality. IMPRESSION: 1. Increased multiple multifocal pneumonia compared to 04/23/2022. 2. Decreased small right pleural effusion and similar left pleural effusion. 3. New small pericardial effusion. 4. Cirrhosis. 5. Upper retroperitoneal lymphadenopathy. Electronically Signed   By: Placido Sou M.D.   On: 04/28/2022 22:13   DG Chest Port 1 View  Result Date: 04/28/2022 CLINICAL DATA:  Shortness of breath.  On CPAP. EXAM: PORTABLE CHEST 1 VIEW COMPARISON:  Radiographs 04/23/2022 and 04/21/2022. CT 04/23/2022 and 09/20/2021. Abdominal CT 04/24/2022. FINDINGS: 1455 hours. The heart is enlarged, but stable. A moderate-sized right pleural effusion appears slightly smaller. However, there are worsening diffuse airspace opacities in both lungs, right greater than left. No evidence of pneumothorax. No acute osseous findings are evident. Telemetry leads overlie the chest. IMPRESSION: 1. Worsening diffuse bilateral airspace opacities, right greater than left, suspicious  for multilobar pneumonia. 2. Slightly decreased right pleural effusion. Electronically Signed   By: Richardean Sale M.D.   On: 04/28/2022 15:05   ECHOCARDIOGRAM COMPLETE  Result Date: 04/25/2022    ECHOCARDIOGRAM REPORT   Patient Name:   SVEA PUSCH Date of Exam: 04/24/2022 Medical Rec #:  [id number]          Height:       64.0 in Accession #:    [id number]         Weight:       259.9 lb Date of Birth:  01-09-1956         BSA:          2.187 m Patient Age:    42 years           BP:           157/73 mmHg Patient Gender: F  HR:           103 bpm. Exam Location:  [facility] Procedure: 2D Echo, Cardiac Doppler and Color Doppler Indications:    CHF-Acute Diastolic V40.08  History:        Patient has prior history of Echocardiogram examinations, most                 recent 12/07/2021. Stroke; Risk Factors:Hypertension,                 Dyslipidemia and Former Smoker. Morbid Obesity.  Sonographer:    Alvino Chapel RCS Referring Phys: [id number] Charlesetta Ivory GONFA IMPRESSIONS  1. Left ventricular ejection fraction, by estimation, is 70 to 75%. The left ventricle has hyperdynamic function. The left ventricle has no regional wall motion abnormalities. There is mild left ventricular hypertrophy. Left ventricular diastolic parameters are consistent with Grade I diastolic dysfunction (impaired relaxation).  2. Right ventricular systolic function is normal. The right ventricular size is normal. Tricuspid regurgitation signal is inadequate for assessing PA pressure.  3. Left atrial size was mildly dilated.  4. The mitral valve is grossly normal. Trivial mitral valve regurgitation.  5. The aortic valve is tricuspid. There is mild calcification of the aortic valve. Aortic valve regurgitation is not visualized. Aortic valve sclerosis/calcification is present, without any evidence of aortic stenosis. Aortic valve mean gradient measures 12.0 mmHg.  6. The inferior vena cava is dilated in size with >50% respiratory  variability, suggesting right atrial pressure of 8 mmHg. Comparison(s): No significant change from prior study. Prior images reviewed side by side. FINDINGS  Left Ventricle: Left ventricular ejection fraction, by estimation, is 70 to 75%. The left ventricle has hyperdynamic function. The left ventricle has no regional wall motion abnormalities. Definity contrast agent was given IV to delineate the left ventricular endocardial borders. The left ventricular internal cavity size was normal in size. There is mild left ventricular hypertrophy. Left ventricular diastolic parameters are consistent with Grade I diastolic dysfunction (impaired relaxation). Right Ventricle: The right ventricular size is normal. No increase in right ventricular wall thickness. Right ventricular systolic function is normal. Tricuspid regurgitation signal is inadequate for assessing PA pressure. Left Atrium: Left atrial size was mildly dilated. Right Atrium: Right atrial size was normal in size. Pericardium: There is no evidence of pericardial effusion. Presence of epicardial fat layer. Mitral Valve: The mitral valve is grossly normal. Mild mitral annular calcification. Trivial mitral valve regurgitation. Tricuspid Valve: The tricuspid valve is grossly normal. Tricuspid valve regurgitation is trivial. Aortic Valve: The aortic valve is tricuspid. There is mild calcification of the aortic valve. Aortic valve regurgitation is not visualized. Aortic valve sclerosis/calcification is present, without any evidence of aortic stenosis. Aortic valve mean gradient measures 12.0 mmHg. Aortic valve peak gradient measures 19.7 mmHg. Aortic valve area, by VTI measures 2.03 cm. Pulmonic Valve: The pulmonic valve was grossly normal. Pulmonic valve regurgitation is trivial. Aorta: The aortic root is normal in size and structure. Venous: The inferior vena cava is dilated in size with greater than 50% respiratory variability, suggesting right atrial pressure of 8  mmHg. IAS/Shunts: No atrial level shunt detected by color flow Doppler.  LEFT VENTRICLE PLAX 2D LVIDd:         4.50 cm LVIDs:         2.20 cm LV PW:         1.20 cm LV IVS:        1.20 cm LVOT diam:     1.90 cm LV  SV:         89 LV SV Index:   41 LVOT Area:     2.84 cm  RIGHT VENTRICLE RV S prime:     22.20 cm/s TAPSE (M-mode): 2.3 cm LEFT ATRIUM             Index        RIGHT ATRIUM           Index LA diam:        3.50 cm 1.60 cm/m   RA Area:     14.90 cm LA Vol (A2C):   59.5 ml 27.21 ml/m  RA Volume:   38.20 ml  17.47 ml/m LA Vol (A4C):   82.2 ml 37.59 ml/m LA Biplane Vol: 71.6 ml 32.74 ml/m  AORTIC VALVE AV Area (Vmax):    2.03 cm AV Area (Vmean):   1.98 cm AV Area (VTI):     2.03 cm AV Vmax:           222.00 cm/s AV Vmean:          159.000 cm/s AV VTI:            0.438 m AV Peak Grad:      19.7 mmHg AV Mean Grad:      12.0 mmHg LVOT Vmax:         159.00 cm/s LVOT Vmean:        111.000 cm/s LVOT VTI:          0.314 m LVOT/AV VTI ratio: 0.72  AORTA Ao Root diam: 3.20 cm MITRAL VALVE MV Area (PHT): 2.83 cm     SHUNTS MV Decel Time: 268 msec     Systemic VTI:  0.31 m MV E velocity: 132.00 cm/s  Systemic Diam: 1.90 cm MV A velocity: 163.00 cm/s MV E/A ratio:  0.81 Rozann Lesches MD Electronically signed by Rozann Lesches MD Signature Date/Time: 04/25/2022/10:50:55 AM    Final    CT ABDOMEN PELVIS W CONTRAST  Result Date: 04/24/2022 CLINICAL DATA:  Abdominal pain, acute, nonlocalized EXAM: CT ABDOMEN AND PELVIS WITH CONTRAST TECHNIQUE: Multidetector CT imaging of the abdomen and pelvis was performed using the standard protocol following bolus administration of intravenous contrast. RADIATION DOSE REDUCTION: This exam was performed according to the departmental dose-optimization program which includes automated exposure control, adjustment of the mA and/or kV according to patient size and/or use of iterative reconstruction technique. CONTRAST:  32m OMNIPAQUE IOHEXOL 300 MG/ML  SOLN COMPARISON:   09/27/2017 FINDINGS: Lower chest: Small bilateral pleural effusions, right greater than left. Airspace opacity in the right lower lobe. Cannot exclude pneumonia. Hepatobiliary: Nodular, shrunken liver compatible with cirrhosis. Gallbladder unremarkable. No focal hepatic abnormality. Pancreas: No focal abnormality or ductal dilatation. Spleen: No focal abnormality.  Normal size. Adrenals/Urinary Tract: Urinary bladder decompressed with Foley catheter in place. No stones or hydronephrosis. No renal or adrenal mass. Stomach/Bowel: Gaseous distension of the colon with moderate liquid stool. Stomach and small bowel decompressed. Vascular/Lymphatic: Scattered aortic calcifications. No evidence of aneurysm or adenopathy. Reproductive: Uterus and adnexa unremarkable.  No mass. Other: No free fluid or free air. Musculoskeletal: No acute bony abnormality. IMPRESSION: Changes of cirrhosis. Gaseous distention of the colon with liquid stool. This may reflect ileus. Small bilateral pleural effusions, right greater than left. Right lower lobe airspace opacity could reflect pneumonia. Scattered aortic atherosclerosis. Electronically Signed   By: KRolm BaptiseM.D.   On: 04/24/2022 21:32   DG Abd Portable 1V  Result Date: 04/24/2022 CLINICAL DATA:  Abdominal pain EXAM: PORTABLE ABDOMEN - 1 VIEW COMPARISON:  MRI abdomen 01/07/2022 FINDINGS: Large and small bowel is diffusely distended with air. Both colon and small bowel loops are borderline dilated. Air seen to the level the rectum. No suspicious calcifications are seen. No acute fractures are identified. There is a small right pleural effusion. There are degenerative changes of both hips. IMPRESSION: Diffuse gaseous distension of large and small bowel loops. Air seen to the level of the rectum. Findings may represent ileus. Consider further evaluation with CT of the abdomen and pelvis with contrast. Electronically Signed   By: Ronney Asters M.D.   On: 04/24/2022 19:37   [place]  CHEST (PLEURAL EFFUSION)  Result Date: 04/24/2022 CLINICAL DATA:  Request for thoracentesis EXAM: CHEST ULTRASOUND COMPARISON:  CT of 1 day prior FINDINGS: Focused ultrasound demonstrates a small to moderate right-sided pleural effusion. This is felt to be insufficient for safe thoracentesis, given patient's size. IMPRESSION: Small to moderate right-sided pleural effusion. Felt to be insufficient for thoracentesis. Electronically Signed   By: Abigail Miyamoto M.D.   On: 04/24/2022 10:00   CT Angio Chest Pulmonary Embolism (PE) W or WO Contrast  Result Date: 04/23/2022 CLINICAL DATA:  Shortness of breath x1 week EXAM: CT ANGIOGRAPHY CHEST WITH CONTRAST TECHNIQUE: Multidetector CT imaging of the chest was performed using the standard protocol during bolus administration of intravenous contrast. Multiplanar CT image reconstructions and MIPs were obtained to evaluate the vascular anatomy. RADIATION DOSE REDUCTION: This exam was performed according to the departmental dose-optimization program which includes automated exposure control, adjustment of the mA and/or kV according to patient size and/or use of iterative reconstruction technique. CONTRAST:  51m OMNIPAQUE IOHEXOL 350 MG/ML SOLN COMPARISON:  Chest radiograph dated 04/23/2022. CTA chest dated 09/20/2021. FINDINGS: Cardiovascular: Satisfactory opacification of the bilateral pulmonary arteries to the lobar level. Evaluation is mildly constrained by respiratory motion. Within that constraint, there is no evidence of pulmonary embolism. Although not tailored for evaluation of the thoracic aorta, there is no evidence thoracic aortic aneurysm or dissection. The heart is normal in size.  No pericardial effusion. Mediastinum/Nodes: No suspicious mediastinal lymphadenopathy. Visualized thyroid is unremarkable. Lungs/Pleura: Evaluation of the lung parenchyma is constrained by respiratory motion. Within that constraint, there are no suspicious pulmonary nodules.  Multifocal interstitial/patchy opacities in the lungs bilaterally, right lung predominant, favoring multifocal pneumonia with possible underlying mild interstitial edema. Moderate right and small left pleural effusions. Associated right lower lobe and left basilar atelectasis. No pneumothorax. Upper Abdomen: Visualized upper abdomen is motion degraded but notable for cirrhosis. Musculoskeletal: Mild degenerative changes of the mid thoracic spine. Review of the MIP images confirms the above findings. IMPRESSION: No evidence of pulmonary embolism. Multifocal pneumonia with possible underlying mild interstitial edema. Moderate right and small left pleural effusions. Electronically Signed   By: SJulian HyM.D.   On: 04/23/2022 17:05   DG Chest Port 1 View  Result Date: 04/23/2022 CLINICAL DATA:  Shortness of breath EXAM: PORTABLE CHEST 1 VIEW COMPARISON:  Prior chest x-ray 04/21/2022 FINDINGS: Progressive right-sided pleural effusion with associated atelectasis. Persistent and unchanged small left pleural effusion. Background cardiomegaly is similar compared to prior. Slightly increased pulmonary vascular congestion. No pneumothorax. IMPRESSION: 1. Increased pulmonary vascular congestion bordering on mild edema. 2. Enlarging right-sided pleural effusion with increased atelectasis. 3. Stable tiny left pleural effusion. Electronically Signed   By: HJacqulynn CadetM.D.   On: 04/23/2022 08:23   DG Chest 2 View  Result Date: 04/21/2022 CLINICAL DATA:  Shortness  of breath, pulmonary edema EXAM: CHEST - 2 VIEW COMPARISON:  04/19/2022 FINDINGS: Heart is mildly enlarged. Continued mild interstitial and airspace opacities with small bilateral effusions. No real change since prior study. No acute bony abnormality. IMPRESSION: Continued cardiomegaly with bilateral interstitial and airspace opacities, favor edema. Small bilateral effusions. Electronically Signed   By: Rolm Baptise M.D.   On: 04/21/2022 10:31    MR BRAIN WO CONTRAST  Result Date: 04/19/2022 CLINICAL DATA:  Delirium. EXAM: MRI HEAD WITHOUT CONTRAST TECHNIQUE: Multiplanar, multiecho pulse sequences of the brain and surrounding structures were obtained without intravenous contrast. COMPARISON:  Head MRI 12/07/2021 FINDINGS: Brain: There is no evidence of an acute infarct, intracranial hemorrhage, mass, midline shift, or extra-axial fluid collection. The ventricles and sulci are within normal limits for age. T2 hyperintensities in the cerebral white matter bilaterally are unchanged and nonspecific but compatible with mild chronic small vessel ischemic disease. Vascular: Major intracranial vascular flow voids are preserved. Skull and upper cervical spine: Unremarkable bone marrow signal para Sinuses/Orbits: Unremarkable orbits. Paranasal sinuses and mastoid air cells are clear. Other: None. IMPRESSION: 1. No acute intracranial abnormality. 2. Mild chronic small vessel ischemic disease. Electronically Signed   By: Logan Bores M.D.   On: 04/19/2022 19:07   DG Chest Portable 1 View  Result Date: 04/19/2022 CLINICAL DATA:  Altered mental status. EXAM: PORTABLE CHEST 1 VIEW COMPARISON:  Chest radiographs 01/02/2022 FINDINGS: The cardiac silhouette remains mildly enlarged. There is persistent pulmonary vascular congestion with increased interstitial densities bilaterally as well as new patchy airspace opacities in the right greater than left lower lungs. There are persistent small left and new small right pleural effusions. No pneumothorax is identified. No acute osseous abnormality is seen. IMPRESSION: Cardiomegaly with small pleural effusions and mixed interstitial and air space opacities which may reflect edema versus pneumonia. Electronically Signed   By: Logan Bores M.D.   On: 04/19/2022 17:30    Microbiology: Recent Results (from the past 240 hour(s))  SARS CORONAVIRUS 2 (TAT 6-24 HRS) Anterior Nasal Swab     Status: None   Collection Time:  05/18/22  3:02 PM   Specimen: Anterior Nasal Swab  Result Value Ref Range Status   SARS Coronavirus 2 NEGATIVE NEGATIVE Final    Comment: (NOTE) SARS-CoV-2 target nucleic acids are NOT DETECTED.  The SARS-CoV-2 RNA is generally detectable in upper and lower respiratory specimens during the acute phase of infection. Negative results do not preclude SARS-CoV-2 infection, do not rule out co-infections with other pathogens, and should not be used as the sole basis for treatment or other patient management decisions. Negative results must be combined with clinical observations, patient history, and epidemiological information. The expected result is Negative.  Fact Sheet for Patients: SugarRoll.be  Fact Sheet for Healthcare Providers: https://www.woods-mathews.com/  This test is not yet approved or cleared by the [place] FDA and  has been authorized for detection and/or diagnosis of SARS-CoV-2 by FDA under an Emergency Use Authorization (EUA). This EUA will remain  in effect (meaning this test can be used) for the duration of the COVID-19 declaration under Se ction 564(b)(1) of the Act, 21 U.S.C. section 360bbb-3(b)(1), unless the authorization is terminated or revoked sooner.  Performed at [facility], [place] [address], [place], [place] [address]      Labs: Basic Metabolic Panel: Recent Labs  Lab 05/13/22 0057 05/14/22 0115 05/15/22 0442 05/16/22 0106 05/17/22 0042 05/18/22 0107 05/19/22 0046  NA 134*   < > 134* 135 135  136 136  K 5.5*   < > 5.0 3.9 4.9 3.7 3.9  CL 93*   < > 99 102 104 103 105  CO2 27   < > _0 GLUCOSE 86   < > 84 95 81 83 82  BUN 48*   < > 57* 45* 35* 27* 22  CREATININE 2.94*   < > 3.45* 2.97* 2.55* 2.49* 2.32*  CALCIUM 11.7*   < > 10.1 10.1 9.6 10.0 10.2  PHOS 5.7*  --  4.7* 4.1  --   --   --    < > = values in this interval not displayed.   Liver Function Tests: Recent Labs  Lab  05/13/22 0057 05/14/22 1705 05/15/22 0442 05/16/22 0106  AST  --  112* 102*  --   ALT  --  48* 47*  --   ALKPHOS  --  84 96  --   BILITOT  --  2.2* 1.8*  --   PROT  --  8.0 8.0  --   ALBUMIN 2.1* 1.8* 1.7*  1.7* 1.7*   No results for input(s): "LIPASE", "AMYLASE" in the last 168 hours. Recent Labs  Lab 05/14/22 1707  AMMONIA 49*   CBC: Recent Labs  Lab 05/15/22 0045 05/18/22 0107  WBC 11.2* 8.1  HGB 11.9* 11.3*  HCT 35.6* 33.9*  MCV 95.7 96.3  PLT PLATELET CLUMPS NOTED ON SMEAR, UNABLE TO ESTIMATE 95*   Cardiac Enzymes: No results for input(s): "CKTOTAL", "CKMB", "CKMBINDEX", "TROPONINI" in the last 168 hours. BNP: BNP (last 3 results) Recent Labs    04/23/22 1617 04/28/22 1429 05/02/22 0456  BNP 64.0 103.7* 168.5*    ProBNP (last 3 results) No results for input(s): "PROBNP" in the last 8760 hours.  CBG: Recent Labs  Lab 05/15/22 2252 05/17/22 0403 05/17/22 0441 05/18/22 0017 05/18/22 0544  GLUCAP 69* 63* 75 92 77       Signed:  Domenic Polite MD.  [facility] 05/19/2022, 11:15 AM

## 2022-05-19 NOTE — Progress Notes (Signed)
Rn called [employer] and spoke with [name] RN to give report.

## 2022-05-19 NOTE — Plan of Care (Signed)
  Problem: Fluid Volume: Goal: Ability to maintain a balanced intake and output will improve Outcome: Progressing   Problem: Clinical Measurements: Goal: Respiratory complications will improve Outcome: Progressing   Problem: Activity: Goal: Risk for activity intolerance will decrease Outcome: Progressing

## 2022-05-19 NOTE — TOC Transition Note (Addendum)
Transition of Care [facility]) - CM/SW Discharge Note   Patient Details  Name: Yolanda [name] MRN: [id number] Date of Birth: [birth year]  Transition of [facility] CM/SW Contact:  Bethann Berkshire, [place] Phone Number: 05/19/2022, 11:19 AM   Clinical Narrative:     Patient will DC to: [facility] SNF Anticipated DC date: 05/19/22 Family notified: Daughter Lenna Sciara and [name] Transport by: Family   Per MD patient ready for DC to [facility]. RN, patient, patient's family, and facility notified of DC. Discharge Summary and FL2 sent to facility. RN to call report prior to discharge [phone]). DC packet on chart. Daughter [name] states a family member will transport pt.   CSW will sign off for now as social work intervention is no longer needed. Please consult [place] again if new needs arise.   Final next level of care: Skilled Nursing Facility Barriers to Discharge: No Barriers Identified   Patient Goals and CMS Choice Patient states their goals for this hospitalization and ongoing recovery are:: Plan for SNF for STR      Discharge Placement              Patient chooses bed at: [facility] Patient to be transferred to facility by: Family Name of family member notified: Daughter Melissa Patient and family notified of of transfer: 05/19/22  Discharge Plan and Services In-house Referral: Clinical Social Work Discharge Planning Services: [employer] Consult Post Acute Care Choice: [place], Resumption of Svcs/PTA Provider            DME Agency: NA       HH Arranged: RN, Disease Management, Nurse's Aide, Social Work [employer] Agency: Microbiologist ([employer]) Date [place]: 05/04/22 Time [place]: [id number] Representative spoke with at [place]: [place] ([place]) Interventions     Readmission Risk Interventions    05/04/2022    4:24 PM 04/25/2022    1:27 PM 09/22/2021   11:13 AM  Readmission Risk Prevention Plan  Post  Dischage Appt   Complete  Medication Screening   Complete  Transportation Screening Complete Complete Complete  HRI or [place]  Complete   Social Work Consult for [place] Planning/Counseling  Complete   Palliative Care Screening  Not Applicable   Medication Review Press photographer) Complete Complete   HRI or [place] Complete    SW Recovery Care/Counseling Consult Complete    [place] Not Applicable

## 2022-05-19 NOTE — Progress Notes (Signed)
Physical Therapy Treatment Patient Details Name: Yolanda [name] MRN: [id number] DOB: 04-Apr-1956 Today's Date: 05/19/2022   History of Present Illness Pt is 66 y/o F admitted to [facility] on 04/28/22 wih SOB, hypoxia, acute on chronic heart failure, Afib with RVR. Worsening hypoxic respiratory failure 10/22 with transfer to ICU; ETT 10/22-10/25. Of note, recent hosptial admission 10/11-10/19 with acute encephalopathy. PMH of anxiety, depression, fibroylagia, GERD, HTN, hypothyroidism, obesity, RA, PE, liver cirrhosis.    PT Comments    The pt was OOB upon arrival, with family reporting increased confusion and tearfulness this morning. Pt continues to require assist for sit-stand transfers from low surfaces and assist to steady at times in standing. The pt remains limited in activity tolerance, and became tearful with encouragement to progress hallway ambulation at this time. Pt will continue to benefit from skilled PT to progress endurance and power to complete sit-stand transfers with greater independence and ambulation with less assist and fatigue.     Recommendations for follow up therapy are one component of a multi-disciplinary discharge planning process, led by the attending physician.  Recommendations may be updated based on patient status, additional functional criteria and insurance authorization.  Follow Up Recommendations  Skilled nursing-short term rehab (<3 hours/day) Can patient physically be transported by private vehicle: Yes   Assistance Recommended at Discharge Frequent or constant Supervision/Assistance  Patient can return home with the following A lot of help with bathing/dressing/bathroom;Assistance with cooking/housework;Direct supervision/assist for medications management;Direct supervision/assist for financial management;Assist for transportation;Help with stairs or ramp for entrance;A little help with walking and/or transfers   Equipment Recommendations   BSC/3in1;Wheelchair (measurements PT);Wheelchair cushion (measurements PT)    Recommendations for Other Services       Precautions / Restrictions Precautions Precautions: Fall Precaution Comments: orthostatic Restrictions Weight Bearing Restrictions: No     Mobility  Bed Mobility               General bed mobility comments: pt OOB at start and end of session    Transfers Overall transfer level: Needs assistance Equipment used: Rolling walker (2 wheels) Transfers: Sit to/from Stand, Bed to chair/wheelchair/BSC Sit to Stand: Min assist   Step pivot transfers: Min guard       General transfer comment: minA to rise from low surface, minG to rise from recliner with use of BUE support. minG to manage lines with pivotal steps to sit in recliner    Ambulation/Gait Ambulation/Gait assistance: Min assist Gait Distance (Feet): 15 Feet Assistive device: Rolling walker (2 wheels) Gait Pattern/deviations: Decreased stride length, Step-through pattern Gait velocity: decreased     General Gait Details: minA for balance, pt declining further ambulaiton and became tearful with suggestion. no overt LOB      Balance Overall balance assessment: Needs assistance Sitting-balance support: No upper extremity supported, Feet supported Sitting balance-Leahy Scale: Good     Standing balance support: Bilateral upper extremity supported Standing balance-Leahy Scale: Poor                              Cognition Arousal/Alertness: Awake/alert Behavior During Therapy: WFL for tasks assessed/performed Overall Cognitive Status: Impaired/Different from baseline Area of Impairment: Attention, Memory, Following commands, Safety/judgement, Awareness, Problem solving                 Orientation Level: Disoriented to, Situation Current Attention Level: Selective Memory: Decreased recall of precautions, Decreased short-term memory Following Commands: Follows one step  commands with increased  time Safety/Judgement: Decreased awareness of safety, Decreased awareness of deficits Awareness: Emergent Problem Solving: Slow processing, Decreased initiation, Requires verbal cues General Comments: pt emotionally labile this morning, cryig multiple times in session for a variety of reasons. Pt frustrated by food limitations, mixing up times of when she told her daughter to come and when her daughter arrived, confused about events of the morning        Exercises      General Comments        Pertinent Vitals/Pain Pain Assessment Pain Assessment: No/denies pain Pain Intervention(s): Monitored during session     PT Goals (current goals can now be found in the care plan section) Acute Rehab PT Goals Patient Stated Goal: to eat better food PT Goal Formulation: With patient Time For Goal Achievement: 05/22/22 Potential to Achieve Goals: Good Progress towards PT goals: Progressing toward goals    Frequency    Min 2X/week      PT Plan Current plan remains appropriate       AM-PAC PT "6 Clicks" Mobility   Outcome Measure  Help needed turning from your back to your side while in a flat bed without using bedrails?: A Little Help needed moving from lying on your back to sitting on the side of a flat bed without using bedrails?: A Little Help needed moving to and from a bed to a chair (including a wheelchair)?: A Little Help needed standing up from a chair using your arms (e.g., wheelchair or bedside chair)?: A Little Help needed to walk in hospital room?: A Little Help needed climbing 3-5 steps with a railing? : Total 6 Click Score: 16    End of Session Equipment Utilized During Treatment: Gait belt Activity Tolerance: Patient tolerated treatment well Patient left: in chair;with call bell/phone within reach;with chair alarm set;with family/visitor present Nurse Communication: Mobility status PT Visit Diagnosis: Muscle weakness (generalized)  (M62.81);Other abnormalities of gait and mobility (R26.89)     Time: 5701-7793 PT Time Calculation (min) (ACUTE ONLY): 24 min  Charges:  $Therapeutic Exercise: 8-22 mins $Therapeutic Activity: 8-22 mins                     Yolanda [name], PT, DPT   Acute Rehabilitation Department   Yolanda Cockayne 05/19/2022, 10:14 AM

## 2022-05-22 ENCOUNTER — Encounter: Payer: Self-pay | Admitting: Adult Health

## 2022-05-22 ENCOUNTER — Non-Acute Institutional Stay (SKILLED_NURSING_FACILITY): Payer: Medicare Other | Admitting: Adult Health

## 2022-05-22 ENCOUNTER — Other Ambulatory Visit: Payer: Self-pay

## 2022-05-22 ENCOUNTER — Telehealth: Payer: Self-pay

## 2022-05-22 DIAGNOSIS — J9611 Chronic respiratory failure with hypoxia: Secondary | ICD-10-CM

## 2022-05-22 DIAGNOSIS — I110 Hypertensive heart disease with heart failure: Secondary | ICD-10-CM | POA: Diagnosis not present

## 2022-05-22 DIAGNOSIS — J189 Pneumonia, unspecified organism: Secondary | ICD-10-CM

## 2022-05-22 DIAGNOSIS — I959 Hypotension, unspecified: Secondary | ICD-10-CM

## 2022-05-22 DIAGNOSIS — J188 Other pneumonia, unspecified organism: Secondary | ICD-10-CM

## 2022-05-22 DIAGNOSIS — K219 Gastro-esophageal reflux disease without esophagitis: Secondary | ICD-10-CM

## 2022-05-22 DIAGNOSIS — E0590 Thyrotoxicosis, unspecified without thyrotoxic crisis or storm: Secondary | ICD-10-CM

## 2022-05-22 DIAGNOSIS — N179 Acute kidney failure, unspecified: Secondary | ICD-10-CM

## 2022-05-22 DIAGNOSIS — K7460 Unspecified cirrhosis of liver: Secondary | ICD-10-CM

## 2022-05-22 DIAGNOSIS — R1319 Other dysphagia: Secondary | ICD-10-CM

## 2022-05-22 DIAGNOSIS — M0579 Rheumatoid arthritis with rheumatoid factor of multiple sites without organ or systems involvement: Secondary | ICD-10-CM | POA: Insufficient documentation

## 2022-05-22 DIAGNOSIS — I5033 Acute on chronic diastolic (congestive) heart failure: Secondary | ICD-10-CM | POA: Diagnosis not present

## 2022-05-22 DIAGNOSIS — I480 Paroxysmal atrial fibrillation: Secondary | ICD-10-CM | POA: Diagnosis not present

## 2022-05-22 DIAGNOSIS — G9341 Metabolic encephalopathy: Secondary | ICD-10-CM

## 2022-05-22 DIAGNOSIS — E66813 Obesity, class 3: Secondary | ICD-10-CM

## 2022-05-22 DIAGNOSIS — E1151 Type 2 diabetes mellitus with diabetic peripheral angiopathy without gangrene: Secondary | ICD-10-CM

## 2022-05-22 DIAGNOSIS — E1169 Type 2 diabetes mellitus with other specified complication: Secondary | ICD-10-CM

## 2022-05-22 DIAGNOSIS — E785 Hyperlipidemia, unspecified: Secondary | ICD-10-CM

## 2022-05-22 DIAGNOSIS — E1159 Type 2 diabetes mellitus with other circulatory complications: Secondary | ICD-10-CM

## 2022-05-22 DIAGNOSIS — F339 Major depressive disorder, recurrent, unspecified: Secondary | ICD-10-CM

## 2022-05-22 DIAGNOSIS — I700 Atherosclerosis of aorta: Secondary | ICD-10-CM

## 2022-05-22 DIAGNOSIS — K7469 Other cirrhosis of liver: Secondary | ICD-10-CM

## 2022-05-22 DIAGNOSIS — K7581 Nonalcoholic steatohepatitis (NASH): Secondary | ICD-10-CM

## 2022-05-22 NOTE — Telephone Encounter (Signed)
Dr Abbey Chatters, Documentation from [facility] came because they help pt pay for her Xifaxan (paperwork expires 07/09/2022) . Well I have filled out paperwork and mailed her the parts she needed to address. Well I seen where she was just released to the [facility] after a 30 day stay at hospital. I don't know what all is involved or how long she will be there. So in other words how would you like me to handle this, or all we can do is wait and she if she is able to do her part to continue the [place]. Please advise.

## 2022-05-22 NOTE — Progress Notes (Unsigned)
Location:  [place] Room Number: 155 Place of Service:  SNF (31)   CODE STATUS: dnr   Allergies  Allergen Reactions   Azithromycin Hives   Lisinopril Cough   Morphine Hives   Sulfa Antibiotics Other (See Comments)    Yeast infection    Chief Complaint  Patient presents with   Hospitalization Follow-up    HPI:  She is a 66 year old woman who has had a prolonged hospitalization from 04-28-22 through 05-19-22. Her medical history includes; CHF: diabetes type 2; hypothyroidism; atrial fibrillation; decompensated liver cirrhosis. She had a recent hospitalization 04-19-22 through 04-27-22 for AMS with acute cystitis. At that time she had a workup which as negative for PE; and found to have new onset afib; multifocal pneumonia and effusion. She was diuresed and discharged to home on 04-27-22 with abt.   On 04-28-22: she presented to the ED with worsening shortness of breath; hypoxia. She was placed on BIPAP again found to have multifocal bilateral infiltrates.  Acute on chronic hypoxic respiratory failure secondary to multifocal pneumonia and CHF Acute on chronic HFpEF with pulmonary edema: She was admitted to the ICU with respiratory failure; she was intubated on 10/22 and extubated on 10/25. There was some concern for aspiration possibly esophageal speech therapy noted. She completed 7 days of zosyn on 05-07-22. Her 02 has been weaned down 3. Dysphagia: has had barium esophagram twice daily the second exam with concerns for moderate esophageal dysmotility; is on aspiration precautions; tolerating current diet.   4. Acute on chronic diastolic CHF: decompensated liver cirrhosis with fluid overload. Her echo shows an EF of 81-84% grade 1 diastolic dysfunction. She was diuresed with IV lasix has transitioned to po. She had to have her diuretic due to acute renal failure; hydration limited by hypotension. She is on midodrine with blood pressure improvement. When her  renal failure improves may need aldactone as well.  She is here for short term rehab with her goal to return back home. She will continue to be followed for her chronic illnesses including:  Acute on chronic diastolic congestive heart failure (CHF)   Paroxsymal atrial fibrillation with RVR:  Benign hypertension co-incident congestive heart failure:   Chronic respiratory failure with hypoxia      Past Medical History:  Diagnosis Date   Abnormal uterine bleeding    Anxiety    Arthritis    Chronic abdominal pain    Chronic pain in left foot    Depression    Elevated liver function tests 2018   Endometriosis    Fibromyalgia    GERD 12/21/2009   Qualifier: Diagnosis of  By: Craige Cotta     Hypertension    stopped meds in Aug 2015   Hypothyroidism    IBS (irritable bowel syndrome)    Internal hemorrhoids    Kidney stone    Liver cirrhosis secondary to NASH (HCC)    Obesity, morbid (HCC)    PONV (postoperative nausea and vomiting)    Psoriatic arthritis ([place])    Pulmonary embolism ([place]) 12/2005   Qualifier: Diagnosis of  By: Kellie Simmering LPN, Almyra Free     Rheumatoid arthritis [facility])    Sleep apnea    Vitamin D deficiency     Past Surgical History:  Procedure Laterality Date   ABDOMINAL SURGERY     laparoscopy   BIOPSY N/A 03/24/2014   Procedure: GASTRIC BIOPSIES;  Surgeon: Danie Binder, MD;  Location: [facility];  Service: Endoscopy;  Laterality:  N/A;   BIOPSY  10/28/2019   Procedure: BIOPSY;  Surgeon: Danie Binder, MD;  Location: [facility] SU[facility];  Service: Endoscopy;;  gastric nodule   BREAST LUMPECTOMY Right    CESAREAN SECTION     X2   COLONOSCOPY  2006   internal hemorrhoids   COLONOSCOPY WITH PROPOFOL N/A 03/24/2014   Dr. [name]: 2 tubular adenomas removed, hemorrhoids   COLONOSCOPY WITH PROPOFOL N/A 10/28/2019   Fields: External and internal hemorrhoids, 8 polyps ranging from 2 to 5 mm in size removed from the colon.  Multiple tubular adenomas.  Next colonoscopy in 3 years.    CYSTOSCOPY W/ RETROGRADES  01/23/2012   Procedure: CYSTOSCOPY WITH RETROGRADE PYELOGRAM;  Surgeon: Marissa Nestle, MD;  Location: [facility];  Service: Urology;  Laterality: Left;   DILATATION & CURETTAGE/HYSTEROSCOPY WITH MYOSURE N/A 11/18/2014   Procedure: DILATATION & CURETTAGE/HYSTEROSCOPY WITH MYOSURE, resection of polyp;  Surgeon: Cheri Fowler, MD;  Location: [place] ORS;  Service: Gynecology;  Laterality: N/A;   DILATION AND CURETTAGE OF UTERUS     ESOPHAGOGASTRODUODENOSCOPY   08/24/2006   Dr. Veto Kemps erythema of the antrum without erosion or ulcers/Otherwise, normal esophagus without evidence of Barrett's path with chronic gastritis   ESOPHAGOGASTRODUODENOSCOPY (EGD) WITH PROPOFOL N/A 03/24/2014   Dr. [name]: gastritis   ESOPHAGOGASTRODUODENOSCOPY (EGD) WITH PROPOFOL N/A 10/28/2019   Fields: Esophagus appeared normal, empiric dilation due to history of dysphagia.  6 mm nodule seen in the gastric cardia.  Mild portal hypertensive gastropathy.  Nodule from the stomach biopsied and showed mild chronic gastritis, no H. pylori.   POLYPECTOMY N/A 03/24/2014   Procedure: POLYPECTOMY;  Surgeon: Danie Binder, MD;  Location: [facility];  Service: Endoscopy;  Laterality: N/A;   POLYPECTOMY  10/28/2019   Procedure: POLYPECTOMY;  Surgeon: Danie Binder, MD;  [facility];  Service: Endoscopy;;  hepatic flexure, ascending colon,sigmoid colon, rectal   REMOVAL OF STONES  01/23/2012   Procedure: REMOVAL OF STONES;  Surgeon: Marissa Nestle, MD;  Location: [facility];  Service: Urology;  Laterality: N/A;   SAVORY DILATION N/A 10/28/2019   Procedure: SAVORY DILATION;  Surgeon: Danie Binder, MD;  Location: [facility];  Service: Endoscopy;  Laterality: N/A;   TUBAL LIGATION      Social History   Socioeconomic History   Marital status: Divorced    Spouse name: Not on file   Number of children: 2   Years of education: 14   Highest education level: Not on file  Occupational History    Occupation: Social research officer, government: [employer]  Tobacco Use   Smoking status: Former    Years: 20.00    Types: Cigarettes    Quit date: 10/24/1998    Years since quitting: 23.5   Smokeless tobacco: Never   Tobacco comments:    1 cig daily  Vaping Use   Vaping Use: Never used  Substance and Sexual Activity   Alcohol use: Not Currently    Comment: "no alcohol in years"   Drug use: No   Sexual activity: Never    Birth control/protection: Post-menopausal  Other Topics Concern   Not on file  Social History Narrative   Right handed   One story with a basement   Drinks caffeine   Social Determinants of Health   Financial Resource Strain: Not on file  Food Insecurity: No Food Insecurity (04/20/2022)   Hunger Vital Sign    Worried About Running Out of Food  in the Last Year: Never true    [place] in the Last Year: Never true  Transportation Needs: No Transportation Needs (04/20/2022)   PRAPARE - Hydrologist (Medical): No    Lack of Transportation (Non-Medical): No  Physical Activity: Not on file  Stress: Not on file  Social Connections: Not on file  Intimate Partner Violence: Not At Risk (04/20/2022)   Humiliation, Afraid, Rape, and Kick questionnaire    Fear of Current or Ex-Partner: No    Emotionally Abused: No    Physically Abused: No    Sexually Abused: No   Family History  Problem Relation Age of Onset   Heart attack Mother        CABG   Stroke Mother    Hypertension Mother    Cancer Mother    Diabetes Mother    Heart attack Father        CABG   Hypertension Father    Mesothelioma Father    Heart attack Brother    Diabetes Brother    Depression Brother    Hyperlipidemia Brother    Diabetes Maternal Grandmother    Diabetes Maternal Grandfather    Cancer Paternal Grandmother    Diabetes Brother    Hyperlipidemia Brother    Wilson's disease Other        2 nieces, one died in her 44s   Colon  cancer Neg Hx       VITAL SIGNS BP 110/76   Pulse 80   Temp (!) 97.4 F (36.3 C)   Resp 20   Ht _0  (1.626 m)   Wt 238 lb 3.2 oz (108 kg)   LMP 07/11/2007 (Approximate)   SpO2 95%   BMI 40.89 kg/m   Outpatient Encounter Medications as of 05/22/2022  Medication Sig   acetaminophen (TYLENOL) 325 MG tablet Take 2 tablets (650 mg total) by mouth every 6 (six) hours as needed for mild pain, headache or fever (or Fever >/= 101).   albuterol (VENTOLIN HFA) 108 (90 Base) MCG/ACT inhaler Inhale 1-2 puffs into the lungs every 6 (six) hours as needed for wheezing or shortness of breath.   amiodarone (PACERONE) 200 MG tablet Take 1 tablet (200 mg total) by mouth daily.   apixaban (ELIQUIS) 5 MG TABS tablet Take 1 tablet (5 mg total) by mouth 2 (two) times daily.   aspirin EC 81 MG tablet Take 81 mg by mouth daily. Swallow whole.   blood glucose meter kit and supplies Dispense based on patient and insurance preference. Use up to four times daily as directed. (FOR ICD-10 E10.9, E11.9).   budesonide-formoterol (SYMBICORT) 80-4.5 MCG/ACT inhaler Take 2 puffs first thing in am and then another 2 puffs about 12 hours later. (Patient taking differently: Inhale 2 puffs into the lungs 2 (two) times daily. Take 2 puffs first thing in am and then another 2 puffs about 12 hours later.)   Calcium Carb-Cholecalciferol (CALCIUM 600 + D PO) Take 1 tablet by mouth daily.   Cholecalciferol (VITAMIN D3) 2000 units TABS Take 2,000 Units by mouth daily.    DULoxetine (CYMBALTA) 60 MG capsule Take 60 mg by mouth daily.   folic acid (FOLVITE) 1 MG tablet Take 1 tablet (1 mg total) by mouth daily.   furosemide (LASIX) 40 MG tablet Take 1 tablet (40 mg total) by mouth daily.   inFLIXimab (REMICADE IV) Inject 1 Syringe into the vein every 3 (three) months.   lactulose, encephalopathy, ([place])  10 GM/15ML SOLN Take 30 mLs (20 g total) by mouth 2 (two) times daily. Goal is 2 to 3 soft/mushy BMs, Ok to take additional  dose if No BM in 24 hrs   levothyroxine (SYNTHROID) 75 MCG tablet Take 75 mcg by mouth daily.   Melatonin 10 MG CAPS Take 10 mg by mouth at bedtime as needed (sleep).   metFORMIN (GLUCOPHAGE-XR) 500 MG 24 hr tablet Take 1 tablet (500 mg total) by mouth 2 (two) times daily with a meal.   metoprolol tartrate (LOPRESSOR) 25 MG tablet Take 0.5 tablets (12.5 mg total) by mouth 2 (two) times daily.   midodrine (PROAMATINE) 10 MG tablet Take 1 tablet (10 mg total) by mouth 2 (two) times daily with a meal.   pantoprazole (PROTONIX) 40 MG tablet Take 40 mg by mouth 2 (two) times daily.   potassium chloride 20 MEQ TBCR Take 20 mEq by mouth daily. Take While taking Lasix/furosemide   rosuvastatin (CRESTOR) 10 MG tablet Take 10 mg by mouth daily.   No facility-administered encounter medications on file as of 05/22/2022.     SIGNIFICANT DIAGNOSTIC EXAMS  TODAY  04-24-22: 2-d echo:  1. Left ventricular ejection fraction, by estimation, is 70 to 75%. The  left ventricle has hyperdynamic function. The left ventricle has no  regional wall motion abnormalities. There is mild left ventricular  hypertrophy. Left ventricular diastolic  parameters are consistent with Grade I diastolic dysfunction (impaired  relaxation).   2. Right ventricular systolic function is normal. The right ventricular  size is normal. Tricuspid regurgitation signal is inadequate for assessing  PA pressure.  04-28-22: ct of chest:  1. Increased multiple multifocal pneumonia compared to 04/23/2022. 2. Decreased small right pleural effusion and similar left pleural effusion. 3. New small pericardial effusion. 4. Cirrhosis. 5. Upper retroperitoneal lymphadenopathy.  05-14-22: chest x-ray:  Cardiomegaly. There is interval decrease in pulmonary vascular congestion and pulmonary edema. There is interval decrease in bilateral pleural effusions with small bilateral residual effusions.  LABS REVIEWED TODAY  12-07-21: hgb A1c 5.4 04-20-22:  tsh 3.861; vitamin B 12: 365 folate 5.4; iron 58; tibc 142 04-28-22: wbc 11.8; hgb 9.8; hct 30.4; mcv 101.0 plt 222; glucose 98; bun 16; creat 0.72; k+ 4.0; na++ 132; ca 9.2; gfr >60; protein 9.2; albumin 2.3; ammonia 20; INT 1.7 05-02-22: wbc 4.9; hgb 10.1; hct 31.8; mcv 100.6 plt 139; glucose 149; bun 40; creat 0.90; k+ 3.2; na++ 145; ca 9.0; gfr >60; protein 9.6; albumin 2.7; mag 1.6; phos 2.2; ammonia 64 05-10-22: wbc 12.0; hgb 12.8; hct 38.2; mcv 96.5 plt 177; glucose 92; bun 36; creat 1.72; k+ 4.4; na++ 134; ca 10.3; gfr 32 05-18-22: wbc 8.1; hgb 11.3; hct 33.9; mcv 96.3 plt 95; glucose 83; bun 27; creat 2.49; k+ 3.7; na++ 136; ca 10.0; gfr 21    Review of Systems  Constitutional:  Negative for malaise/fatigue.  Respiratory:  Negative for cough and shortness of breath.   Cardiovascular:  Negative for chest pain, palpitations and leg swelling.  Gastrointestinal:  Negative for abdominal pain, constipation and heartburn.  Musculoskeletal:  Negative for back pain, joint pain and myalgias.  Skin: Negative.   Neurological:  Negative for dizziness.  Psychiatric/Behavioral:  The patient is not nervous/anxious.     Physical Exam Constitutional:      General: She is not in acute distress.    Appearance: She is well-developed. She is morbidly obese. She is not diaphoretic.  Neck:     Thyroid: No thyromegaly.  Cardiovascular:     Rate and Rhythm: Normal rate and regular rhythm.     Pulses: Normal pulses.     Heart sounds: Normal heart sounds.  Pulmonary:     Effort: Pulmonary effort is normal. No respiratory distress.     Breath sounds: Normal breath sounds.  Abdominal:     General: Bowel sounds are normal. There is no distension.     Palpations: Abdomen is soft.     Tenderness: There is no abdominal tenderness.  Musculoskeletal:        General: Normal range of motion.     Cervical back: Neck supple.     Right lower leg: No edema.     Left lower leg: No edema.     Comments: Lymphedema    Lymphadenopathy:     Cervical: No cervical adenopathy.  Skin:    General: Skin is warm and dry.  Neurological:     Mental Status: She is alert. Mental status is at baseline.     Comments: Disoriented   Psychiatric:        Mood and Affect: Mood normal.      ASSESSMENT/ PLAN:  TODAY  Acute on chronic diastolic congestive heart failure (CHF) EF 70-75%; is euvolemic; will continue lasix 40 mg daily with k+ 20 meq daily is on lopressor 12.5 mg twice daily   2. Paroxsymal atrial fibrillation with RVR: heart rate is stable; will continue amiodarone 200 mg daily with lopressor 12.5 mg twice daily for rate control; will continue eliquis 5 mg twice daily   3. Benign hypertension co-incident congestive heart failure: b/p 110/76 will continue lopressor 12.5 mg twice daily   4.  Chronic respiratory failure with hypoxia / multifocal pneumonia: has completed abt. Is 02 dependent; will continue symbicort 80/4.5 mcg 2 puffs twice daily; albuterol 2 puffs every 6 hours as needed  5. Esophageal dysphagia: without further signs of aspiration present.   6. Gastroesophageal reflux disease without esophagitis: will continue protonix 40 mg twice daily   7. Liver cirrhosis secondary to NASH;/acute metabolic encephalopathy  will continue lactulose 53m twice daily the goal is 2-3 bm's daily   8.  Hypothyroidism: tsh 3.861 will continue synthroid 75 mcg daily   9. Type 2 diabetes mellitus with atherosclerosis of aorta: hgb A1c 5.4; will stop metformin due to renal failure and will monitor  10. Aortic atherosclerosis: (ct 04-24-22); is on asa 81 mg daily and statin  11. Hyperlipidemia associated with type 2 diabetes mellitus: will continue crestor 10 mg daily   12. AKI her creat remains at 2.49; will continue to monitor  13. Class 3 obesity: BMI 45.14. has type 2 diabetes; hypertension; hyperlipidemia.   14. Seropositive rheumatoid arthritis of multiple sites; is on remicade as directed  15. Major  depression recurrent chronic: will continue cymbalta 60 mg daily   16. Hypotension: will continue midodrine 10 mg twice daily    Will check cbc; cmp ammonia    DOk EdwardsNP [facility]  call [phone]

## 2022-05-23 ENCOUNTER — Encounter: Payer: Self-pay | Admitting: Internal Medicine

## 2022-05-23 ENCOUNTER — Encounter (HOSPITAL_COMMUNITY): Payer: Self-pay | Admitting: Rheumatology

## 2022-05-23 ENCOUNTER — Other Ambulatory Visit (HOSPITAL_COMMUNITY)
Admission: RE | Admit: 2022-05-23 | Discharge: 2022-05-23 | Disposition: A | Discharge status: Home / Self Care | Payer: Medicare Other | Source: Skilled Nursing Facility | Attending: Adult Health | Admitting: Adult Health

## 2022-05-23 ENCOUNTER — Ambulatory Visit (INDEPENDENT_AMBULATORY_CARE_PROVIDER_SITE_OTHER): Payer: Medicare Other | Admitting: Internal Medicine

## 2022-05-23 ENCOUNTER — Non-Acute Institutional Stay (SKILLED_NURSING_FACILITY): Payer: Medicare Other | Admitting: Internal Medicine

## 2022-05-23 VITALS — BP 104/56 | HR 58 | Ht 62.0 in | Wt 246.8 lb

## 2022-05-23 DIAGNOSIS — K7469 Other cirrhosis of liver: Secondary | ICD-10-CM | POA: Insufficient documentation

## 2022-05-23 DIAGNOSIS — E1169 Type 2 diabetes mellitus with other specified complication: Secondary | ICD-10-CM

## 2022-05-23 DIAGNOSIS — I480 Paroxysmal atrial fibrillation: Secondary | ICD-10-CM

## 2022-05-23 DIAGNOSIS — J189 Pneumonia, unspecified organism: Secondary | ICD-10-CM | POA: Diagnosis not present

## 2022-05-23 DIAGNOSIS — N179 Acute kidney failure, unspecified: Secondary | ICD-10-CM

## 2022-05-23 DIAGNOSIS — I482 Chronic atrial fibrillation, unspecified: Secondary | ICD-10-CM

## 2022-05-23 DIAGNOSIS — E785 Hyperlipidemia, unspecified: Secondary | ICD-10-CM

## 2022-05-23 DIAGNOSIS — I5033 Acute on chronic diastolic (congestive) heart failure: Secondary | ICD-10-CM

## 2022-05-23 LAB — COMPREHENSIVE METABOLIC PANEL
ALT: 68 U/L — ABNORMAL HIGH (ref 0–44)
AST: 83 U/L — ABNORMAL HIGH (ref 15–41)
Albumin: 1.8 g/dL — ABNORMAL LOW (ref 3.5–5.0)
Alkaline Phosphatase: 156 U/L — ABNORMAL HIGH (ref 38–126)
Anion gap: 7 (ref 5–15)
BUN: 25 mg/dL — ABNORMAL HIGH (ref 8–23)
CO2: 24 mmol/L (ref 22–32)
Calcium: 10.4 mg/dL — ABNORMAL HIGH (ref 8.9–10.3)
Chloride: 100 mmol/L (ref 98–111)
Creatinine, Ser: 2.09 mg/dL — ABNORMAL HIGH (ref 0.44–1.00)
GFR, Estimated: 25 mL/min — ABNORMAL LOW (ref >60)
Glucose, Bld: 99 mg/dL (ref 70–99)
Potassium: 4.1 mmol/L (ref 3.5–5.1)
Sodium: 131 mmol/L — ABNORMAL LOW (ref 135–145)
Total Bilirubin: 2 mg/dL — ABNORMAL HIGH (ref 0.3–1.2)
Total Protein: 7.9 g/dL (ref 6.5–8.1)

## 2022-05-23 LAB — CBC
HCT: 33.8 % — ABNORMAL LOW (ref 36.0–46.0)
Hemoglobin: 11.2 g/dL — ABNORMAL LOW (ref 12.0–15.0)
MCH: 31.6 pg (ref 26.0–34.0)
MCHC: 33.1 g/dL (ref 30.0–36.0)
MCV: 95.5 fL (ref 80.0–100.0)
Platelets: 109 10*3/uL — ABNORMAL LOW (ref 150–400)
RBC: 3.54 MIL/uL — ABNORMAL LOW (ref 3.87–5.11)
RDW: 17.3 % — ABNORMAL HIGH (ref 11.5–15.5)
WBC: 8.8 10*3/uL (ref 4.0–10.5)
nRBC: 0.3 % — ABNORMAL HIGH (ref 0.0–0.2)

## 2022-05-23 LAB — AMMONIA: Ammonia: 46 umol/L — ABNORMAL HIGH (ref 9–35)

## 2022-05-23 LAB — PROTIME-INR
INR: 2.6 — ABNORMAL HIGH (ref 0.8–1.2)
Prothrombin Time: 27.3 seconds — ABNORMAL HIGH (ref 11.4–15.2)

## 2022-05-23 MED ORDER — FUROSEMIDE 40 MG PO TABS: 40.0000 mg | ORAL_TABLET | Freq: Two times a day (BID) | ORAL | 3 refills | Status: AC

## 2022-05-23 NOTE — Assessment & Plan Note (Signed)
Eliquis prophylaxis continued.  Monitor for bleeding dyscrasias.

## 2022-05-23 NOTE — Patient Instructions (Signed)
See assessment and plan under each diagnosis in the problem list and acutely for this visit 

## 2022-05-23 NOTE — Assessment & Plan Note (Signed)
Monitor hepatorenal function.  She is not a candidate for Aldactone at this time with the GFR of 22.

## 2022-05-23 NOTE — Assessment & Plan Note (Signed)
S/P full course of antibiotics as inpatient. Barium esophagram completed x2 while hospitalized.  Second exam suggested moderate esophageal dysmotility necessitating aspiration precautions. Speech therapy to follow at SNF.

## 2022-05-23 NOTE — Progress Notes (Unsigned)
NURSING HOME LOCATION:  [facility] ROOM NUMBER:  [id number]  CODE STATUS:  DNR  PCP:  Celene Squibb MD  This is a comprehensive admission note to this SNFperformed on this date less than 30 days from date of admission. Included are preadmission medical/surgical history; reconciled medication list; family history; social history and comprehensive review of systems.  Corrections and additions to the records were documented. Comprehensive physical exam was also performed. Additionally a clinical summary was entered for each active diagnosis pertinent to this admission in the Problem List to enhance continuity of care.  HPI: The patient was hospitalized 10/20 - 05/19/2022 for acute on chronic diastolic congestive heart failure complicated by acute hypoxic respiratory failure and decompensated hepatic cirrhosis.  The cirrhotic decompensation was associated with hepatic encephalopathy.  Course was complicated by aspiration pneumonia. She had actually been hospitalized 10/11 - 10/19 with AMS in the context of acute cystitis.  New onset A-fib was documented as well as multifocal pneumonia associated with effusion.  She was diuresed and discharged home 10/19 on antibiotics. She returned on 10/20 with worsening dyspnea with hypoxia.  BiPAP was initiated.  Chest x-ray revealed multifocal bilateral infiltrates.  Antibiotics and diuretics were reinitiated 10/22 ; but despite this respiratory status declined necessitating PCCM consultation,intubation and transfer to the ICU. She was extubated 10/25. She completed 7 days of IV Zosyn as of 10/29.  With antibiotics and diuresis oxygen needs trended down. Dysphagia was evaluated with barium esophagram on 2 occasions.  The second exam findings were of concern for moderate esophageal dysmotility.  Aspiration precautions were continued. Course was complicated by AKI with creatinine up to 3.59 with a nadir GFR of 13. At presentation creatinine had been 0.72  with a GFR greater than 60.  At discharge 11/10 creatinine was 2.32 and GFR 22 indicating CKD stage IV necessitating holding diuretics.  Hypotension was treated with midodrine.  Subsequently diuresis was reinitiated with furosemide 40 mg daily. It was planned to initiate Aldactone once renal function was stable. Lactulose was continued with plans to resume rifaximin when clinically appropriate. She also exhibited atrial fibrillation with rapid ventricular response necessitating IV amiodarone bolus on multiple occasions.  Eliquis was initiated. Glucoses ranged from a low of 63 up to a high of 196 while hospitalized.  The latter is an outlier as most glucoses were normal.  There is no current A1c on record. At presentation macrocytic anemia was present with H/H of 9.8/30.4.  Macrocytosis resolved; final H/H was 11.3/33.9. Albumin was 1.7 in the context of the cirrhosis; total protein was normal to actually elevated with a range of 8 up to a peak of 9.8. Palliative care was discussed with the family.  PT/OT recommended SNF placement for rehab due to debilitation and multiple advanced comorbidities.  Past medical and surgical history: Includes Psoriatic arthritis,NASH cirrhosis,fibromyalgia,endometriosis, history of PTE, paroxysmal atrial fibrillation, OSA, essential hypertension, anxiety/depression, GERD, RA, and hypothyroidism. Surgeries & procedures include laparoscopy,colonoscopy with polypectomy,cystoscopy,EGD,stone removal, & breast lumpectomy.  Social history: former smoker; non drinker  Family history: extensive history reviewed. Her daughter also hs NASH.   Review of systems: Clinical neurocognitive deficits made validity of responses questionable.  She can provide no meaningful history concerning the hospitalization.  When asked the reason for hospitalization her response was "stuff wrong with my chest".  She also was noted to be confabulating intermittently.  She does validate dysphagia  especially with pills.  She believes that she was told she needed to have at least  4 loose bowel movements a day on the lactulose.  She denies any active cardiopulmonary symptoms to suggest decompensation.  Constitutional: No fever, significant weight change, fatigue  Eyes: No redness, discharge, pain, vision change ENT/mouth: No nasal congestion, purulent discharge, earache, change in hearing, sore throat  Cardiovascular: No chest pain, palpitations, paroxysmal nocturnal dyspnea, claudication  Respiratory: No cough, sputum production, hemoptysis, DOE, significant snoring, apnea  Gastrointestinal: No heartburn, nausea /vomiting, rectal bleeding, melena Genitourinary: No dysuria, hematuria, pyuria, incontinence, nocturia Musculoskeletal: No joint stiffness, joint swelling, weakness, pain Dermatologic: No rash, pruritus, change in appearance of skin Neurologic: No dizziness, headache, syncope, seizures, numbness, tingling Psychiatric: No significant anxiety, depression, insomnia, anorexia Endocrine: No change in hair/skin/nails, excessive thirst, excessive hunger, excessive urination  Hematologic/lymphatic: No significant bruising, lymphadenopathy, abnormal bleeding Allergy/immunology: No itchy/watery eyes, significant sneezing, urticaria, angioedema  Physical exam:  Pertinent or positive findings: She is morbidly obese with central predominance.  She is wearing nasal oxygen.  She was lying in the left lateral decubitus position with no evidence of increased work of breathing.  Heart rhythm is regular.  Breath sounds are decreased.  Abdomen is massive.  There is dullness to percussion in both upper quadrants.  She expressed some discomfort with the percussion on the right.  Pedal pulses are decreased.  There is trace edema at the sock line.  Bland hyperpigmentation is noted over the shins.  She has scattered bruising over the forearms.  She exhibits intention type tremor of both hands.  General  appearance: no acute distress, increased work of breathing is present.   Lymphatic: No lymphadenopathy about the head, neck, axilla. Eyes: No conjunctival inflammation or lid edema is present. There is no scleral icterus. Ears:  External ear exam shows no significant lesions or deformities.   Nose:  External nasal examination shows no deformity or inflammation. Nasal mucosa are pink and moist without lesions, exudates Oral exam: Lips and gums are healthy appearing.There is no oropharyngeal erythema or exudate. Neck:  No thyromegaly, masses, tenderness noted.    Heart:  Normal rate and regular rhythm. S1 and S2 normal without gallop, murmur, click, rub.  Lungs:  without wheezes, rhonchi, rales, rubs. Abdomen: Bowel sounds are normal.  Abdomen is soft with no definite organomegaly, hernias, masses. GU: Deferred  Extremities:  No cyanosis, clubbing. Neurologic exam: Balance, Rhomberg, finger to nose testing could not be completed due to clinical state Skin: Warm & dry w/o tenting.  See clinical summary under each active problem in the Problem List with associated updated therapeutic plan

## 2022-05-23 NOTE — Patient Instructions (Addendum)
Medication Instructions:  Your physician has recommended you make the following change in your medication:  Increase Lasix 40 mg tablets twice daily   Labwork: CMET  Testing/Procedures: None  Follow-Up: Follow up with Dr. Dellia Cloud in 1 month.   Any Other Special Instructions Will Be Listed Below (If Applicable).     If you need a refill on your cardiac medications before your next appointment, please call your pharmacy.

## 2022-05-23 NOTE — Progress Notes (Unsigned)
Cardiology Office Note  Date: 05/23/2022   ID: [name], DOB 08-06-1955, MRN [id number]  PCP:  Celene Squibb, MD  Cardiologist:  None Electrophysiologist:  None   Reason for Office Visit: Post hospitalization follow-up   History of Present Illness: Yolanda [name] is a 66 y.o. female known to chronic respiratory failure on 3 L of [place] O2, have liver cirrhosis secondary to Yolanda [name], OSA, A-fib, HFpEF presented to cardiology clinic for posthospitalization follow-up.  Patient was admitted to [facility] in 10/23 with A-fib with RVR and HFpEF, was placed on BiPAP and discharged after adequate diuresis and rate control. However patient bounced back within a day following discharge from [facility] with acute respiratory distress from acute hypoxic respiratory failure secondary to aspiration pneumonia. Patient was admitted to [facility], treated for aspiration pneumonia with antibiotics and intubation, IV diuresis for HFpEF, IV Amio multiple times for A-fib with RVR and eventually discharged.  Patient is currently not on [facility] due to risk of falls and not a candidate for Watchman device due to further deterioration in her clinical condition following my consult at [facility].  Following discharge from [facility] on 1110, patient's daughter reported that patient already gained 10 pounds of weight associated with worsening SOB.  Past Medical History:  Diagnosis Date   Abnormal uterine bleeding    Anxiety    Arthritis    Chronic abdominal pain    Chronic pain in left foot    Depression    Elevated liver function tests 2018   Endometriosis    Fibromyalgia    GERD 12/21/2009   Qualifier: Diagnosis of  By: Craige Cotta     Hypertension    stopped meds in Aug 2015   Hypothyroidism    IBS (irritable bowel syndrome)    Internal hemorrhoids    Kidney stone    Liver cirrhosis secondary to NASH (HCC)    Obesity, morbid (HCC)    PONV (postoperative nausea and  vomiting)    Psoriatic arthritis ([place])    Pulmonary embolism ([place]) 12/2005   Qualifier: Diagnosis of  By: Kellie Simmering LPN, Almyra Free     Rheumatoid arthritis [facility])    Sleep apnea    Vitamin D deficiency     Past Surgical History:  Procedure Laterality Date   ABDOMINAL SURGERY     laparoscopy   BIOPSY N/A 03/24/2014   Procedure: GASTRIC BIOPSIES;  Surgeon: Danie Binder, MD;  Location: [facility];  Service: Endoscopy;  Laterality: N/A;   BIOPSY  10/28/2019   Procedure: BIOPSY;  Surgeon: Danie Binder, MD;  [facility];  Service: Endoscopy;;  gastric nodule   BREAST LUMPECTOMY Right    CESAREAN SECTION     X2   COLONOSCOPY  2006   internal hemorrhoids   COLONOSCOPY WITH PROPOFOL N/A 03/24/2014   Dr. [name]: 2 tubular adenomas removed, hemorrhoids   COLONOSCOPY WITH PROPOFOL N/A 10/28/2019   Fields: External and internal hemorrhoids, 8 polyps ranging from 2 to 5 mm in size removed from the colon.  Multiple tubular adenomas.  Next colonoscopy in 3 years.   CYSTOSCOPY W/ RETROGRADES  01/23/2012   Procedure: CYSTOSCOPY WITH RETROGRADE PYELOGRAM;  Surgeon: Marissa Nestle, MD;  Location: [facility];  Service: Urology;  Laterality: Left;   DILATATION & CURETTAGE/HYSTEROSCOPY WITH MYOSURE N/A 11/18/2014   Procedure: DILATATION & CURETTAGE/HYSTEROSCOPY WITH MYOSURE, resection of polyp;  Surgeon: Cheri Fowler, MD;  Location: [facility];  Service: Gynecology;  Laterality:  N/A;   DILATION AND CURETTAGE OF UTERUS     ESOPHAGOGASTRODUODENOSCOPY   08/24/2006   Dr. Veto Kemps erythema of the antrum without erosion or ulcers/Otherwise, normal esophagus without evidence of Barrett's path with chronic gastritis   ESOPHAGOGASTRODUODENOSCOPY (EGD) WITH PROPOFOL N/A 03/24/2014   Dr. [name]: gastritis   ESOPHAGOGASTRODUODENOSCOPY (EGD) WITH PROPOFOL N/A 10/28/2019   Fields: Esophagus appeared normal, empiric dilation due to history of dysphagia.  6 mm nodule seen in the gastric cardia.  Mild portal  hypertensive gastropathy.  Nodule from the stomach biopsied and showed mild chronic gastritis, no H. pylori.   POLYPECTOMY N/A 03/24/2014   Procedure: POLYPECTOMY;  Surgeon: Danie Binder, MD;  Location: [facility];  Service: Endoscopy;  Laterality: N/A;   POLYPECTOMY  10/28/2019   Procedure: POLYPECTOMY;  Surgeon: Danie Binder, MD;  [facility];  Service: Endoscopy;;  hepatic flexure, ascending colon,sigmoid colon, rectal   REMOVAL OF STONES  01/23/2012   Procedure: REMOVAL OF STONES;  Surgeon: Marissa Nestle, MD;  Location: [facility];  Service: Urology;  Laterality: N/A;   SAVORY DILATION N/A 10/28/2019   Procedure: SAVORY DILATION;  Surgeon: Danie Binder, MD;  [facility];  Service: Endoscopy;  Laterality: N/A;   TUBAL LIGATION      Current Outpatient Medications  Medication Sig Dispense Refill   acetaminophen (TYLENOL) 325 MG tablet Take 2 tablets (650 mg total) by mouth every 6 (six) hours as needed for mild pain, headache or fever (or Fever >/= 101). 100 tablet 2   albuterol (VENTOLIN HFA) 108 (90 Base) MCG/ACT inhaler Inhale 1-2 puffs into the lungs every 6 (six) hours as needed for wheezing or shortness of breath. 6.7 g 0   amiodarone (PACERONE) 200 MG tablet Take 1 tablet (200 mg total) by mouth daily.     apixaban (ELIQUIS) 5 MG TABS tablet Take 1 tablet (5 mg total) by mouth 2 (two) times daily. 60 tablet    aspirin EC 81 MG tablet Take 81 mg by mouth daily. Swallow whole.     blood glucose meter kit and supplies Dispense based on patient and insurance preference. Use up to four times daily as directed. (FOR ICD-10 E10.9, E11.9). 1 each 0   budesonide-formoterol (SYMBICORT) 80-4.5 MCG/ACT inhaler Take 2 puffs first thing in am and then another 2 puffs about 12 hours later. (Patient taking differently: Inhale 2 puffs into the lungs 2 (two) times daily. Take 2 puffs first thing in am and then another 2 puffs about 12 hours later.) 1 each 12   Calcium  Carb-Cholecalciferol (CALCIUM 600 + D PO) Take 1 tablet by mouth daily.     Cholecalciferol (VITAMIN D3) 2000 units TABS Take 2,000 Units by mouth daily.      DULoxetine (CYMBALTA) 60 MG capsule Take 60 mg by mouth daily.     folic acid (FOLVITE) 1 MG tablet Take 1 tablet (1 mg total) by mouth daily.     furosemide (LASIX) 40 MG tablet Take 1 tablet (40 mg total) by mouth daily. 90 tablet 1   inFLIXimab (REMICADE IV) Inject 1 Syringe into the vein every 3 (three) months.     lactulose, encephalopathy, (CHRONULAC) 10 GM/15ML SOLN Take 30 mLs (20 g total) by mouth 2 (two) times daily. Goal is 2 to 3 soft/mushy BMs, Ok to take additional dose if No BM in 24 hrs 946 mL 5   levothyroxine (SYNTHROID) 75 MCG tablet Take 75 mcg by mouth daily.  Melatonin 10 MG CAPS Take 10 mg by mouth at bedtime as needed (sleep).     metFORMIN (GLUCOPHAGE-XR) 500 MG 24 hr tablet Take 1 tablet (500 mg total) by mouth 2 (two) times daily with a meal. 180 tablet 1   metoprolol tartrate (LOPRESSOR) 25 MG tablet Take 0.5 tablets (12.5 mg total) by mouth 2 (two) times daily.     midodrine (PROAMATINE) 10 MG tablet Take 1 tablet (10 mg total) by mouth 2 (two) times daily with a meal.     pantoprazole (PROTONIX) 40 MG tablet Take 40 mg by mouth 2 (two) times daily.     potassium chloride 20 MEQ TBCR Take 20 mEq by mouth daily. Take While taking Lasix/furosemide     rosuvastatin (CRESTOR) 10 MG tablet Take 10 mg by mouth daily.     No current facility-administered medications for this visit.   Allergies:  Azithromycin, Lisinopril, Morphine, and Sulfa antibiotics   Social History: The patient  reports that she quit smoking about 23 years ago. Her smoking use included cigarettes. She has never used smokeless tobacco. She reports that she does not currently use alcohol. She reports that she does not use drugs.   Family History: The patient's family history includes Cancer in her mother and paternal grandmother; Depression in  her brother; Diabetes in her brother, brother, maternal grandfather, maternal grandmother, and mother; Heart attack in her brother, father, and mother; Hyperlipidemia in her brother and brother; Hypertension in her father and mother; Mesothelioma in her father; Stroke in her mother; Wilson's disease in an other family member.   ROS:  Please see the history of present illness. Otherwise, complete review of systems is positive for none.  All other systems are reviewed and negative.   Physical Exam: VS:  BP (!) 104/56   Pulse (!) 58   Ht _0  (1.575 m)   Wt 246 lb 12.8 oz (111.9 kg)   LMP 07/11/2007 (Approximate)   SpO2 94%   BMI 45.14 kg/m , BMI Body mass index is 45.14 kg/m.  Wt Readings from Last 3 Encounters:  05/23/22 246 lb 12.8 oz (111.9 kg)  05/22/22 238 lb 3.2 oz (108 kg)  05/19/22 236 lb 8 oz (107.3 kg)    General: Patient appears comfortable at rest. HEENT: Conjunctiva and lids normal, oropharynx clear with moist mucosa. Neck: Supple, no elevated JVP or carotid bruits, no thyromegaly. Lungs: Clear to auscultation, nonlabored breathing at rest. Cardiac: Regular rate and rhythm, no S3 or significant systolic murmur, no pericardial rub. Abdomen: Soft, nontender, no hepatomegaly, bowel sounds present, no guarding or rebound. Extremities: No pitting edema, distal pulses 2+. Skin: Warm and dry. Musculoskeletal: No kyphosis. Neuropsychiatric: Alert and oriented x3, affect grossly appropriate.  Recent Labwork: 04/20/2022: TSH 3.861 05/02/2022: B Natriuretic Peptide 168.5 05/11/2022: Magnesium 2.2 05/15/2022: ALT 47; AST 102 05/18/2022: Hemoglobin 11.3; Platelets 95 05/19/2022: BUN 22; Creatinine, Ser 2.32; Potassium 3.9; Sodium 136     Component Value Date/Time   CHOL 130 12/07/2021 0458   TRIG 47 12/07/2021 0458   HDL 29 (L) 12/07/2021 0458   CHOLHDL 4.5 12/07/2021 0458   VLDL 9 12/07/2021 0458   LDLCALC 92 12/07/2021 0458    Other Studies Reviewed  Today:   Assessment and Plan: Patient is a 66 year old F known to have paroxysmal A-fib, liver cirrhosis secondary to [place], HFpEF, chronic respiratory failure on 3 L of oxygen, OSA presented to cardiology clinic for posthospitalization follow-up.  #Paroxysmal A-fib -Continue metoprolol tartrate 12.5 mg twice daily,  amiodarone 200 mg once daily and Eliquis 5 mg twice daily -Patient already had 4 falls since June 2023 and is at risk of falls due to wobbly feet.  Hence, she is not a candidate for anticoagulation.  Watchman device is indicated in these patients however she is not a candidate due to multiple hospital admissions for acute on chronic respiratory failure.  #HFpEF #liver cirrhosis -Patient is currently on Lasix 40 mg once daily. Increase p.o. Lasix 40 mg once daily to twice daily due to 10 pound weight gain.  If patient continues to have worsening SOB despite Lasix dosing, patient is instructed to go to the ER. -Follow-up with GI for liver cirrhosis management -Follow-up with palliative care as outpatient   I have spent a total of 35 minutes with patient reviewing chart  EKGs, labs and examining patient as well as establishing an assessment and plan that was discussed with the patient.  > 50% of time was spent in direct patient care.     Medication Adjustments/Labs and Tests Ordered: Current medicines are reviewed at length with the patient today.  Concerns regarding medicines are outlined above.   Tests Ordered: No orders of the defined types were placed in this encounter.   Medication Changes: No orders of the defined types were placed in this encounter.   Disposition:  Follow up  1 month  Signed, Billiejo Sorto Fidel Levy, MD, 05/23/2022 11:40 AM    [facility] at [facility] [address], [place], [place] [address]

## 2022-05-23 NOTE — Assessment & Plan Note (Signed)
No current A1c in Epic.  Glucoses ranged from a low of 63 up to high of 196 while hospitalized.  Metformin will be discontinued because of her present AKI stage IV.

## 2022-05-24 ENCOUNTER — Encounter: Payer: Self-pay | Admitting: Internal Medicine

## 2022-05-24 DIAGNOSIS — I700 Atherosclerosis of aorta: Secondary | ICD-10-CM | POA: Insufficient documentation

## 2022-05-24 DIAGNOSIS — E1151 Type 2 diabetes mellitus with diabetic peripheral angiopathy without gangrene: Secondary | ICD-10-CM | POA: Insufficient documentation

## 2022-05-24 DIAGNOSIS — I110 Hypertensive heart disease with heart failure: Secondary | ICD-10-CM | POA: Insufficient documentation

## 2022-05-24 DIAGNOSIS — E1169 Type 2 diabetes mellitus with other specified complication: Secondary | ICD-10-CM | POA: Insufficient documentation

## 2022-05-24 DIAGNOSIS — F339 Major depressive disorder, recurrent, unspecified: Secondary | ICD-10-CM | POA: Insufficient documentation

## 2022-05-24 DIAGNOSIS — I959 Hypotension, unspecified: Secondary | ICD-10-CM | POA: Insufficient documentation

## 2022-05-24 NOTE — Telephone Encounter (Signed)
Noted Paperwork put in the mail to the pt

## 2022-05-24 NOTE — Telephone Encounter (Signed)
She needs to be on Xifaxan. Hopefully she addresses her part of paperwork and it gets approved

## 2022-05-24 NOTE — Assessment & Plan Note (Signed)
Metformin discontinued.  Aldactone will not be initiated until there is improvement in her CKD.

## 2022-05-25 ENCOUNTER — Encounter: Payer: Self-pay | Admitting: Adult Health

## 2022-05-25 ENCOUNTER — Non-Acute Institutional Stay (SKILLED_NURSING_FACILITY): Payer: Medicare Other | Admitting: Adult Health

## 2022-05-25 DIAGNOSIS — K7581 Nonalcoholic steatohepatitis (NASH): Secondary | ICD-10-CM

## 2022-05-25 DIAGNOSIS — K7460 Unspecified cirrhosis of liver: Secondary | ICD-10-CM | POA: Diagnosis not present

## 2022-05-25 NOTE — Progress Notes (Signed)
Location:  [place]   Place of Service:   SNF    CODE STATUS: dnr   Allergies  Allergen Reactions   Azithromycin Hives   Lisinopril Cough   Morphine Hives   Sulfa Antibiotics Other (See Comments)    Yeast infection    Chief Complaint  Patient presents with   Acute Visit    Follow up status change     HPI:  She continues to have a very poor appetite; poor fluid intake. She does not have an appetite. She states to have a metallic taste in her mouth. Her blood pressure readings are soft at this time. She does have some increased confusion as well.    Past Medical History:  Diagnosis Date   Abnormal uterine bleeding    Anxiety    Arthritis    Chronic abdominal pain    Chronic pain in left foot    Depression    Elevated liver function tests 2018   Endometriosis    Fibromyalgia    GERD 12/21/2009   Qualifier: Diagnosis of  By: Craige Cotta     Hypertension    stopped meds in Aug 2015   Hypothyroidism    IBS (irritable bowel syndrome)    Internal hemorrhoids    Kidney stone    Liver cirrhosis secondary to NASH (HCC)    Obesity, morbid (HCC)    PONV (postoperative nausea and vomiting)    Psoriatic arthritis ([place])    Pulmonary embolism ([place]) 12/2005   Qualifier: Diagnosis of  By: Kellie Simmering LPN, Almyra Free     Rheumatoid arthritis [facility])    Sleep apnea    Vitamin D deficiency     Past Surgical History:  Procedure Laterality Date   ABDOMINAL SURGERY     laparoscopy   BIOPSY N/A 03/24/2014   Procedure: GASTRIC BIOPSIES;  Surgeon: Danie Binder, MD;  Location: [facility];  Service: Endoscopy;  Laterality: N/A;   BIOPSY  10/28/2019   Procedure: BIOPSY;  Surgeon: Danie Binder, MD;  [facility];  Service: Endoscopy;;  gastric nodule   BREAST LUMPECTOMY Right    CESAREAN SECTION     X2   COLONOSCOPY  2006   internal hemorrhoids   COLONOSCOPY WITH PROPOFOL N/A 03/24/2014   Dr. [name]: 2 tubular adenomas removed, hemorrhoids   COLONOSCOPY WITH  PROPOFOL N/A 10/28/2019   Fields: External and internal hemorrhoids, 8 polyps ranging from 2 to 5 mm in size removed from the colon.  Multiple tubular adenomas.  Next colonoscopy in 3 years.   CYSTOSCOPY W/ RETROGRADES  01/23/2012   Procedure: CYSTOSCOPY WITH RETROGRADE PYELOGRAM;  Surgeon: Marissa Nestle, MD;  Location: [facility];  Service: Urology;  Laterality: Left;   DILATATION & CURETTAGE/HYSTEROSCOPY WITH MYOSURE N/A 11/18/2014   Procedure: DILATATION & CURETTAGE/HYSTEROSCOPY WITH MYOSURE, resection of polyp;  Surgeon: Cheri Fowler, MD;  Location: [place];  Service: Gynecology;  Laterality: N/A;   DILATION AND CURETTAGE OF UTERUS     ESOPHAGOGASTRODUODENOSCOPY   08/24/2006   Dr. Veto Kemps erythema of the antrum without erosion or ulcers/Otherwise, normal esophagus without evidence of Barrett's path with chronic gastritis   ESOPHAGOGASTRODUODENOSCOPY (EGD) WITH PROPOFOL N/A 03/24/2014   Dr. [name]: gastritis   ESOPHAGOGASTRODUODENOSCOPY (EGD) WITH PROPOFOL N/A 10/28/2019   Fields: Esophagus appeared normal, empiric dilation due to history of dysphagia.  6 mm nodule seen in the gastric cardia.  Mild portal hypertensive gastropathy.  Nodule from the stomach biopsied and showed mild chronic gastritis, no H.  pylori.   POLYPECTOMY N/A 03/24/2014   Procedure: POLYPECTOMY;  Surgeon: Danie Binder, MD;  Location: [facility];  Service: Endoscopy;  Laterality: N/A;   POLYPECTOMY  10/28/2019   Procedure: POLYPECTOMY;  Surgeon: Danie Binder, MD;  [facility];  Service: Endoscopy;;  hepatic flexure, ascending colon,sigmoid colon, rectal   REMOVAL OF STONES  01/23/2012   Procedure: REMOVAL OF STONES;  Surgeon: Marissa Nestle, MD;  Location: [facility];  Service: Urology;  Laterality: N/A;   SAVORY DILATION N/A 10/28/2019   Procedure: SAVORY DILATION;  Surgeon: Danie Binder, MD;  [facility];  Service: Endoscopy;  Laterality: N/A;   TUBAL LIGATION      Social History    Socioeconomic History   Marital status: Divorced    Spouse name: Not on file   Number of children: 2   Years of education: 14   Highest education level: Not on file  Occupational History   Occupation: Social research officer, government: [employer]  Tobacco Use   Smoking status: Former    Years: 20.00    Types: Cigarettes    Quit date: 10/24/1998    Years since quitting: 23.6   Smokeless tobacco: Never   Tobacco comments:    1 cig daily  Vaping Use   Vaping Use: Never used  Substance and Sexual Activity   Alcohol use: Not Currently    Comment: "no alcohol in years"   Drug use: No   Sexual activity: Never    Birth control/protection: Post-menopausal  Other Topics Concern   Not on file  Social History Narrative   Right handed   One story with a basement   Drinks caffeine   Social Determinants of Health   Financial Resource Strain: Not on file  Food Insecurity: No Food Insecurity (04/20/2022)   Hunger Vital Sign    Worried About Running Out of Food in the Last Year: Never true    Ran Out of Food in the Last Year: Never true  Transportation Needs: No Transportation Needs (04/20/2022)   PRAPARE - Hydrologist (Medical): No    Lack of Transportation (Non-Medical): No  Physical Activity: Not on file  Stress: Not on file  Social Connections: Not on file  Intimate Partner Violence: Not At Risk (04/20/2022)   Humiliation, Afraid, Rape, and Kick questionnaire    Fear of Current or Ex-Partner: No    Emotionally Abused: No    Physically Abused: No    Sexually Abused: No   Family History  Problem Relation Age of Onset   Heart attack Mother        CABG   Stroke Mother    Hypertension Mother    Cancer Mother    Diabetes Mother    Heart attack Father        CABG   Hypertension Father    Mesothelioma Father    Heart attack Brother    Diabetes Brother    Depression Brother    Hyperlipidemia Brother    Diabetes Maternal  Grandmother    Diabetes Maternal Grandfather    Cancer Paternal Grandmother    Diabetes Brother    Hyperlipidemia Brother    Wilson's disease Other        2 nieces, one died in her 77s   Colon cancer Neg Hx       VITAL SIGNS BP (!) 86/58   Pulse 79   Temp 97.6 F (36.4  C)   Resp 20   Ht _0  (1.575 m)   Wt 236 lb 9.6 oz (107.3 kg)   LMP 07/11/2007 (Approximate)   SpO2 97%   BMI 43.27 kg/m   Outpatient Encounter Medications as of 05/25/2022  Medication Sig   acetaminophen (TYLENOL) 325 MG tablet Take 2 tablets (650 mg total) by mouth every 6 (six) hours as needed for mild pain, headache or fever (or Fever >/= 101).   albuterol (VENTOLIN HFA) 108 (90 Base) MCG/ACT inhaler Inhale 1-2 puffs into the lungs every 6 (six) hours as needed for wheezing or shortness of breath.   amiodarone (PACERONE) 200 MG tablet Take 1 tablet (200 mg total) by mouth daily.   apixaban (ELIQUIS) 5 MG TABS tablet Take 1 tablet (5 mg total) by mouth 2 (two) times daily.   aspirin EC 81 MG tablet Take 81 mg by mouth daily. Swallow whole.   blood glucose meter kit and supplies Dispense based on patient and insurance preference. Use up to four times daily as directed. (FOR ICD-10 E10.9, E11.9).   budesonide-formoterol (SYMBICORT) 80-4.5 MCG/ACT inhaler Take 2 puffs first thing in am and then another 2 puffs about 12 hours later. (Patient taking differently: Inhale 2 puffs into the lungs 2 (two) times daily. Take 2 puffs first thing in am and then another 2 puffs about 12 hours later.)   Cholecalciferol (VITAMIN D3) 2000 units TABS Take 2,000 Units by mouth daily.    DULoxetine (CYMBALTA) 60 MG capsule Take 60 mg by mouth daily.   folic acid (FOLVITE) 1 MG tablet Take 1 tablet (1 mg total) by mouth daily.   furosemide (LASIX) 40 MG tablet Take 1 tablet (40 mg total) by mouth 2 (two) times daily.   inFLIXimab (REMICADE IV) Inject 1 Syringe into the vein every 3 (three) months.   lactulose, encephalopathy,  (CHRONULAC) 10 GM/15ML SOLN Take 30 mLs (20 g total) by mouth 2 (two) times daily. Goal is 2 to 3 soft/mushy BMs, Ok to take additional dose if No BM in 24 hrs   levothyroxine (SYNTHROID) 75 MCG tablet Take 75 mcg by mouth daily.   Melatonin 10 MG CAPS Take 10 mg by mouth at bedtime as needed (sleep).   metoprolol tartrate (LOPRESSOR) 25 MG tablet Take 0.5 tablets (12.5 mg total) by mouth 2 (two) times daily.   midodrine (PROAMATINE) 10 MG tablet Take 1 tablet (10 mg total) by mouth 2 (two) times daily with a meal.   pantoprazole (PROTONIX) 40 MG tablet Take 40 mg by mouth 2 (two) times daily.   potassium chloride 20 MEQ TBCR Take 20 mEq by mouth daily. Take While taking Lasix/furosemide   rosuvastatin (CRESTOR) 10 MG tablet Take 10 mg by mouth daily.   No facility-administered encounter medications on file as of 05/25/2022.     SIGNIFICANT DIAGNOSTIC EXAMS  PREVIOUS   04-24-22: 2-d echo:  1. Left ventricular ejection fraction, by estimation, is 70 to 75%. The  left ventricle has hyperdynamic function. The left ventricle has no  regional wall motion abnormalities. There is mild left ventricular  hypertrophy. Left ventricular diastolic  parameters are consistent with Grade I diastolic dysfunction (impaired  relaxation).   2. Right ventricular systolic function is normal. The right ventricular  size is normal. Tricuspid regurgitation signal is inadequate for assessing  PA pressure.  04-28-22: ct of chest:  1. Increased multiple multifocal pneumonia compared to 04/23/2022. 2. Decreased small right pleural effusion and similar left pleural effusion. 3. New small  pericardial effusion. 4. Cirrhosis. 5. Upper retroperitoneal lymphadenopathy.  05-14-22: chest x-ray:  Cardiomegaly. There is interval decrease in pulmonary vascular congestion and pulmonary edema. There is interval decrease in bilateral pleural effusions with small bilateral residual effusions.  NO NEW EXAMS   LABS REVIEWED  PREVIOUS   12-07-21: hgb A1c 5.4 04-20-22: tsh 3.861; vitamin B 12: 365 folate 5.4; iron 58; tibc 142 04-28-22: wbc 11.8; hgb 9.8; hct 30.4; mcv 101.0 plt 222; glucose 98; bun 16; creat 0.72; k+ 4.0; na++ 132; ca 9.2; gfr >60; protein 9.2; albumin 2.3; ammonia 20; INT 1.7 05-02-22: wbc 4.9; hgb 10.1; hct 31.8; mcv 100.6 plt 139; glucose 149; bun 40; creat 0.90; k+ 3.2; na++ 145; ca 9.0; gfr >60; protein 9.6; albumin 2.7; mag 1.6; phos 2.2; ammonia 64 05-10-22: wbc 12.0; hgb 12.8; hct 38.2; mcv 96.5 plt 177; glucose 92; bun 36; creat 1.72; k+ 4.4; na++ 134; ca 10.3; gfr 32 05-18-22: wbc 8.1; hgb 11.3; hct 33.9; mcv 96.3 plt 95; glucose 83; bun 27; creat 2.49; k+ 3.7; na++ 136; ca 10.0; gfr 21   NO NEW LABS.   Review of Systems  Reason unable to perform ROS: confusion.   Physical Exam Constitutional:      General: She is not in acute distress.    Appearance: She is well-developed. She is morbidly obese. She is not diaphoretic.  Neck:     Thyroid: No thyromegaly.  Cardiovascular:     Rate and Rhythm: Normal rate and regular rhythm.     Pulses: Normal pulses.     Heart sounds: Normal heart sounds.  Pulmonary:     Effort: Pulmonary effort is normal. No respiratory distress.     Breath sounds: Normal breath sounds.  Abdominal:     General: Bowel sounds are normal. There is no distension.     Palpations: Abdomen is soft.     Tenderness: There is no abdominal tenderness.  Musculoskeletal:        General: Normal range of motion.     Cervical back: Neck supple.     Right lower leg: No edema.     Left lower leg: No edema.     Comments: Lymphedema  Lymphadenopathy:     Cervical: No cervical adenopathy.  Skin:    General: Skin is warm and dry.  Neurological:     Mental Status: She is alert. She is disoriented.        ASSESSMENT/ PLAN:  TODAY  Liver cirrhosis secondary to NASH   Will begin remeron 7.5 mg nightly for 30 days Will begin ensure max three times daily    Ok Edwards NP [facility]  call [phone]

## 2022-05-26 ENCOUNTER — Other Ambulatory Visit: Payer: Self-pay | Admitting: *Deleted

## 2022-05-26 NOTE — Patient Outreach (Signed)
Yolanda [name] resides in [facility]. Screening for potential [facility] care coordination services as benefit of insurance plan and PCP.   Update received from [employer], SNF social worker indicating Yolanda [name] could benefit from [facility] care coordination services post SNF.  Yolanda [name] is progressing slowly with therapy.   Will continue to follow and plan outreach to discuss [facility] care coordination follow up.   Marthenia Rolling, MSN, RN,BSN [place] Acute Care Coordinator [phone] (Direct dial)

## 2022-05-27 ENCOUNTER — Other Ambulatory Visit: Payer: Self-pay

## 2022-05-27 ENCOUNTER — Encounter (HOSPITAL_COMMUNITY): Payer: Self-pay | Admitting: Emergency Medicine

## 2022-05-27 ENCOUNTER — Emergency Department (HOSPITAL_COMMUNITY): Payer: Medicare Other

## 2022-05-27 ENCOUNTER — Emergency Department (HOSPITAL_COMMUNITY)
Admission: EM | Admit: 2022-05-27 | Discharge: 2022-05-28 | Disposition: A | Discharge status: Home / Self Care | Payer: Medicare Other | Attending: Emergency Medicine | Admitting: Emergency Medicine

## 2022-05-27 DIAGNOSIS — N189 Chronic kidney disease, unspecified: Secondary | ICD-10-CM | POA: Diagnosis not present

## 2022-05-27 DIAGNOSIS — R410 Disorientation, unspecified: Secondary | ICD-10-CM | POA: Diagnosis not present

## 2022-05-27 DIAGNOSIS — J9611 Chronic respiratory failure with hypoxia: Secondary | ICD-10-CM

## 2022-05-27 DIAGNOSIS — Z79899 Other long term (current) drug therapy: Secondary | ICD-10-CM | POA: Insufficient documentation

## 2022-05-27 DIAGNOSIS — R531 Weakness: Secondary | ICD-10-CM

## 2022-05-27 DIAGNOSIS — K7460 Unspecified cirrhosis of liver: Secondary | ICD-10-CM | POA: Insufficient documentation

## 2022-05-27 DIAGNOSIS — Z7901 Long term (current) use of anticoagulants: Secondary | ICD-10-CM | POA: Insufficient documentation

## 2022-05-27 DIAGNOSIS — E860 Dehydration: Secondary | ICD-10-CM | POA: Insufficient documentation

## 2022-05-27 DIAGNOSIS — I5032 Chronic diastolic (congestive) heart failure: Secondary | ICD-10-CM | POA: Diagnosis not present

## 2022-05-27 DIAGNOSIS — Z7982 Long term (current) use of aspirin: Secondary | ICD-10-CM | POA: Insufficient documentation

## 2022-05-27 LAB — BLOOD GAS, VENOUS
Acid-Base Excess: 0.8 mmol/L (ref 0.0–2.0)
Bicarbonate: 26 mmol/L (ref 20.0–28.0)
Drawn by: 7049
FIO2: 32 %
O2 Saturation: 53.1 %
Patient temperature: 36.6
pCO2, Ven: 42 mmHg — ABNORMAL LOW (ref 44–60)
pH, Ven: 7.4 (ref 7.25–7.43)
pO2, Ven: 33 mmHg (ref 32–45)

## 2022-05-27 LAB — CBC
HCT: 33.8 % — ABNORMAL LOW (ref 36.0–46.0)
Hemoglobin: 11.4 g/dL — ABNORMAL LOW (ref 12.0–15.0)
MCH: 31.8 pg (ref 26.0–34.0)
MCHC: 33.7 g/dL (ref 30.0–36.0)
MCV: 94.2 fL (ref 80.0–100.0)
Platelets: 126 10*3/uL — ABNORMAL LOW (ref 150–400)
RBC: 3.59 MIL/uL — ABNORMAL LOW (ref 3.87–5.11)
RDW: 17.5 % — ABNORMAL HIGH (ref 11.5–15.5)
WBC: 7.5 10*3/uL (ref 4.0–10.5)
nRBC: 0.5 % — ABNORMAL HIGH (ref 0.0–0.2)

## 2022-05-27 LAB — CBG MONITORING, ED: Glucose-Capillary: 84 mg/dL (ref 70–99)

## 2022-05-27 LAB — BASIC METABOLIC PANEL
Anion gap: 6 (ref 5–15)
BUN: 39 mg/dL — ABNORMAL HIGH (ref 8–23)
CO2: 26 mmol/L (ref 22–32)
Calcium: 10.4 mg/dL — ABNORMAL HIGH (ref 8.9–10.3)
Chloride: 98 mmol/L (ref 98–111)
Creatinine, Ser: 2.66 mg/dL — ABNORMAL HIGH (ref 0.44–1.00)
GFR, Estimated: 19 mL/min — ABNORMAL LOW (ref >60)
Glucose, Bld: 88 mg/dL (ref 70–99)
Potassium: 3.8 mmol/L (ref 3.5–5.1)
Sodium: 130 mmol/L — ABNORMAL LOW (ref 135–145)

## 2022-05-27 LAB — HEPATIC FUNCTION PANEL
ALT: 66 U/L — ABNORMAL HIGH (ref 0–44)
AST: 112 U/L — ABNORMAL HIGH (ref 15–41)
Albumin: 1.7 g/dL — ABNORMAL LOW (ref 3.5–5.0)
Alkaline Phosphatase: 172 U/L — ABNORMAL HIGH (ref 38–126)
Bilirubin, Direct: 0.8 mg/dL — ABNORMAL HIGH (ref 0.0–0.2)
Indirect Bilirubin: 1.2 mg/dL — ABNORMAL HIGH (ref 0.3–0.9)
Total Bilirubin: 2 mg/dL — ABNORMAL HIGH (ref 0.3–1.2)
Total Protein: 8.1 g/dL (ref 6.5–8.1)

## 2022-05-27 LAB — AMMONIA: Ammonia: 49 umol/L — ABNORMAL HIGH (ref 9–35)

## 2022-05-27 LAB — LACTIC ACID, PLASMA: Lactic Acid, Venous: 3.1 mmol/L (ref 0.5–1.9)

## 2022-05-27 LAB — POC OCCULT BLOOD, ED: Fecal Occult Bld: POSITIVE — AB

## 2022-05-27 MED ORDER — SODIUM CHLORIDE 0.9 % IV BOLUS
500.0000 mL | Freq: Once | INTRAVENOUS | Status: AC
  Administered 2022-05-27: 500 mL via INTRAVENOUS

## 2022-05-27 MED ORDER — FLUCONAZOLE 150 MG PO TABS
150.0000 mg | ORAL_TABLET | Freq: Once | ORAL | Status: AC
  Administered 2022-05-27: 150 mg via ORAL
  Filled 2022-05-27: qty 1

## 2022-05-27 NOTE — ED Notes (Signed)
Pt observed to be bleeding from interior lower gum line. Pressure applied following Oral care and cleaning. Minimal suction required. Bleeding under control at this time.

## 2022-05-27 NOTE — ED Notes (Signed)
ED Provider at bedside. 

## 2022-05-27 NOTE — ED Notes (Signed)
While collecting urine specimen during In and Out, Pt noted to have large yeast infection present on admission to ED. EDP notified.

## 2022-05-27 NOTE — ED Triage Notes (Signed)
Pt from [facility] with c/o hypotension, failure to thrive, blood in stools and urine.

## 2022-05-27 NOTE — ED Provider Notes (Signed)
[facility] EMERGENCY DEPARTMENT Provider Note   CSN: [id number] Arrival date & time: 05/27/22  1942     History  Chief Complaint  Patient presents with   Hypotension   Weakness    Yolanda [name] is a 66 y.o. female.  HPI     66 year old female with history of [name] liver cirrhosis, diastolic CHF, PE-A-fib not on any anticoagulation, chronic respiratory failure on 3 L of oxygen, rheumatoid arthritis comes into the emergency room from nursing home with chief complaint of low blood pressure and evaluation for blood in the urine and dark stool.  Patient had extended admissions to the hospital in October and again in November due to her chronic conditions.  She was found to have pneumonia, cystitis and hepatic encephalopathy.  She also had hypoxic respiratory failure that required intubation and admission to ICU.  Patient has been residing at the [facility] for the last week.  She is brought to the emergency room from the nursing facility, accompanied by 2 of her daughters.  The daughters provide most of the history.  They state that the nursing home noted that patient's blood pressure was low, and that she was having blood in the urine and also her stools were dark.  The nursing home also stated to them that patient has not been eating or drinking well.  Patient is oriented to self, location and answers questions appropriately.  She denies any new pain.  She denies any headache, chest pain, cough, abdominal pain or any UTI-like symptoms.  Patient has had upper and lower endoscopy in the past that revealed gastritis and internal hemorrhoids.  There is no evidence of varices in the past and there is not been any history of upper GI bleed.  Home Medications Prior to Admission medications   Medication Sig Start Date End Date Taking? Authorizing Provider  acetaminophen (TYLENOL) 325 MG tablet Take 2 tablets (650 mg total) by mouth every 6 (six) hours as needed for mild pain, headache  or fever (or Fever >/= 101). 04/27/22   Roxan Hockey, MD  albuterol (VENTOLIN HFA) 108 (90 Base) MCG/ACT inhaler Inhale 1-2 puffs into the lungs every 6 (six) hours as needed for wheezing or shortness of breath. 11/08/19   Long, Wonda Olds, MD  amiodarone (PACERONE) 200 MG tablet Take 1 tablet (200 mg total) by mouth daily. 05/20/22   Domenic Polite, MD  apixaban (ELIQUIS) 5 MG TABS tablet Take 1 tablet (5 mg total) by mouth 2 (two) times daily. 05/19/22   Domenic Polite, MD  aspirin EC 81 MG tablet Take 81 mg by mouth daily. Swallow whole.    [provider]  blood glucose meter kit and supplies Dispense based on patient and insurance preference. Use up to four times daily as directed. (FOR ICD-10 E10.9, E11.9). 09/22/21   Johnson, Clanford L, MD  budesonide-formoterol (SYMBICORT) 80-4.5 MCG/ACT inhaler Take 2 puffs first thing in am and then another 2 puffs about 12 hours later. Patient taking differently: Inhale 2 puffs into the lungs 2 (two) times daily. Take 2 puffs first thing in am and then another 2 puffs about 12 hours later. 11/25/21   Tanda Rockers, MD  Cholecalciferol (VITAMIN D3) 2000 units TABS Take 2,000 Units by mouth daily.     [provider]  DULoxetine (CYMBALTA) 60 MG capsule Take 60 mg by mouth daily. 07/15/21   [provider]  folic acid (FOLVITE) 1 MG tablet Take 1 tablet (1 mg total) by mouth daily.  05/20/22   Domenic Polite, MD  furosemide (LASIX) 40 MG tablet Take 1 tablet (40 mg total) by mouth 2 (two) times daily. 05/23/22 05/18/23  Mallipeddi, Vishnu P, MD  inFLIXimab (REMICADE IV) Inject 1 Syringe into the vein every 3 (three) months.    [provider]  lactulose, encephalopathy, (CHRONULAC) 10 GM/15ML SOLN Take 30 mLs (20 g total) by mouth 2 (two) times daily. Goal is 2 to 3 soft/mushy BMs, Ok to take additional dose if No BM in 24 hrs 04/27/22   Roxan Hockey, MD  levothyroxine (SYNTHROID) 75 MCG tablet Take 75 mcg by mouth  daily. 12/26/21   [provider]  Melatonin 10 MG CAPS Take 10 mg by mouth at bedtime as needed (sleep).    [provider]  metoprolol tartrate (LOPRESSOR) 25 MG tablet Take 0.5 tablets (12.5 mg total) by mouth 2 (two) times daily. 05/19/22   Domenic Polite, MD  midodrine (PROAMATINE) 10 MG tablet Take 1 tablet (10 mg total) by mouth 2 (two) times daily with a meal. 05/19/22   Domenic Polite, MD  pantoprazole (PROTONIX) 40 MG tablet Take 40 mg by mouth 2 (two) times daily.    [provider]  potassium chloride 20 MEQ TBCR Take 20 mEq by mouth daily. Take While taking Lasix/furosemide 05/19/22   Domenic Polite, MD  rosuvastatin (CRESTOR) 10 MG tablet Take 10 mg by mouth daily.    [provider]      Allergies    Azithromycin, Lisinopril, Morphine, and Sulfa antibiotics    Review of Systems   Review of Systems  All other systems reviewed and are negative.   Physical Exam Updated Vital Signs BP (!) 92/51   Pulse 81   Temp 97.8 F (36.6 C) (Oral)   Resp 18   Ht _0  (1.575 m)   Wt 107 kg   LMP 07/11/2007 (Approximate)   SpO2 100%   BMI 43.15 kg/m  Physical Exam Vitals and nursing note reviewed.  Constitutional:      Appearance: She is well-developed.  HENT:     Head: Atraumatic.  Eyes:     General: Scleral icterus present.  Cardiovascular:     Rate and Rhythm: Normal rate.  Pulmonary:     Effort: Pulmonary effort is normal.  Abdominal:     Tenderness: There is abdominal tenderness. There is no guarding or rebound.     Comments: Generalized abdominal tenderness  Musculoskeletal:     Cervical back: Normal range of motion and neck supple.     Right lower leg: Edema present.     Left lower leg: Edema present.  Skin:    General: Skin is warm and dry.  Neurological:     Mental Status: She is alert and oriented to person, place, and time.     ED Results / Procedures / Treatments   Labs (all labs ordered are listed, but only  abnormal results are displayed) Labs Reviewed  BASIC METABOLIC PANEL - Abnormal; Notable for the following components:      Result Value   Sodium 130 (*)    BUN 39 (*)    Creatinine, Ser 2.66 (*)    Calcium 10.4 (*)    GFR, Estimated 19 (*)    All other components within normal limits  CBC - Abnormal; Notable for the following components:   RBC 3.59 (*)    Hemoglobin 11.4 (*)    HCT 33.8 (*)    RDW 17.5 (*)    Platelets  126 (*)    nRBC 0.5 (*)    All other components within normal limits  AMMONIA - Abnormal; Notable for the following components:   Ammonia 49 (*)    All other components within normal limits  LACTIC ACID, PLASMA - Abnormal; Notable for the following components:   Lactic Acid, Venous 3.1 (*)    All other components within normal limits  BLOOD GAS, VENOUS - Abnormal; Notable for the following components:   pCO2, Ven 42 (*)    All other components within normal limits  HEPATIC FUNCTION PANEL - Abnormal; Notable for the following components:   Albumin 1.7 (*)    AST 112 (*)    ALT 66 (*)    Alkaline Phosphatase 172 (*)    Total Bilirubin 2.0 (*)    Bilirubin, Direct 0.8 (*)    Indirect Bilirubin 1.2 (*)    All other components within normal limits  POC OCCULT BLOOD, ED - Abnormal; Notable for the following components:   Fecal Occult Bld POSITIVE (*)    All other components within normal limits  URINE CULTURE  URINALYSIS, ROUTINE W REFLEX MICROSCOPIC  LACTIC ACID, PLASMA  CBG MONITORING, ED    EKG EKG Interpretation  Date/Time:  Saturday May 27 2022 20:32:31 EST Ventricular Rate:  70 PR Interval:  163 QRS Duration: 97 QT Interval:  402 QTC Calculation: 434 R Axis:   13 Text Interpretation: Sinus rhythm Low voltage, precordial leads Borderline T abnormalities, anterior leads No acute changes No significant change since last tracing Confirmed by Varney Biles ([id number]) on 05/27/2022 11:13:21 PM  Radiology DG Chest Port 1 View  Result Date:  05/27/2022 CLINICAL DATA:  Chronic hip oxy a EXAM: PORTABLE CHEST 1 VIEW COMPARISON:  05/14/2022 FINDINGS: Cardiomegaly, vascular congestion. Small right pleural effusion with right base atelectasis. Left lung clear. No overt edema. No acute bony abnormality. IMPRESSION: Cardiomegaly with vascular congestion. Small right pleural effusion with right basilar atelectasis. Electronically Signed   By: Rolm Baptise M.D.   On: 05/27/2022 21:33    Procedures .Critical Care  Performed by: Varney Biles, MD Authorized by: Varney Biles, MD   Critical care provider statement:    Critical care time (minutes):  48   Critical care was necessary to treat or prevent imminent or life-threatening deterioration of the following conditions:  Circulatory failure, CNS failure or compromise, hepatic failure and respiratory failure   Critical care was time spent personally by me on the following activities:  Development of treatment plan with patient or surrogate, discussions with consultants, evaluation of patient's response to treatment, examination of patient, ordering and review of laboratory studies, ordering and review of radiographic studies, ordering and performing treatments and interventions, pulse oximetry, re-evaluation of patient's condition, review of old charts and obtaining history from patient or surrogate     Medications Ordered in ED Medications  sodium chloride 0.9 % bolus 500 mL (0 mLs Intravenous Stopped 05/27/22 2353)  sodium chloride 0.9 % bolus 500 mL (500 mLs Intravenous New Bag/Given 05/27/22 2356)  fluconazole (DIFLUCAN) tablet 150 mg (150 mg Oral Given 05/27/22 2359)    ED Course/ Medical Decision Making/ A&P Clinical Course as of 05/28/22 0002  Sat May 27, 2022  2359 Blood gas, venous (at [facility], not at [facility])(!) pH 7.4. -Normal. [AN]  Sun May 28, 2022  0000 Lactic Acid, Venous(!!): 3.1 Lactic acid slightly elevated.  Likely secondary to her liver disease.  Also likely due to  hypovolemia. [AN]  0000 Indirect BilirubinMarland Kitchen):  1.2 Patient has profound hypoalbuminemia, it is at baseline.  We will give her a dose of IV albumin here. Rest of the LFTs are at baseline for the patient. [AN]  0001 WBC: 7.5 No elevated white count. [AN]  0001 Hemoglobin(!): 11.4 Hemoglobin is at patient's baseline.  She is Hemoccult positive.  No melena. [AN]  0001 Creatinine(!): 2.66 Creatinine is around the patient's baseline as well. [AN]  0001 UA pending.  Nursing staff notified that patient likely has yeast infection.  Diflucan given. [AN]  0001 Patient's care signed out to incoming team.  UA pending at this time.  Anticipate discharge.  Case discussed with family.  Daughters agree with the plan.  Return precautions discussed.  Patient is high risk for readmission.  I do not think admission to the hospital is going to be beneficial, especially since she is in a monitored setting.  Risk of admission outweighs the benefit in my opinion. [AN]    Clinical Course User Index [AN] Varney Biles, MD                           Medical Decision Making 66 year old female with history of CKD, liver cirrhosis, CHF, A-fib, prior history of PE not on any anticoagulation, chronic hypoxic respiratory failure comes in with chief complaint of weakness, blood in the urine and dark stool.  Patient had long admissions recently given her comorbidities.  I have reviewed patient's charts/discharge summary.  Have also reviewed endoscopy and colonoscopy.  History provided by patient's daughters.  On exam, patient has no focal neurodeficits.  She is somnolent, but arousable and answers questions appropriately when awake.  Her BP is better than perhaps it was at the nursing home.  102/60 during my assessment, but patient's BP is ranging between 9983 systolic here and the diastolic has ranged between 40 and 60.  It appears that patient is dry.  She is also likely third spacing given her liver disease.    Differential diagnosis includes hepatic encephalopathy, severe electro abnormality, acute on chronic renal failure, acute on chronic CHF, profound dehydration, hemorrhagic cystitis, peptic ulcer disease, severe anemia.  Patient's abdominal exam is reassuring.  It does not appear that she has any new focal neurodeficits and her mentation likely is a result of delirium and possibly some of her medication side effects.  Hepatic encephalopathy also possible, but no asterixis appreciated and patient appears to be fairly normal with her responses.  Ammonia level also sent along with basic labs.  Amount and/or Complexity of Data Reviewed Labs: ordered. Radiology: ordered.    Final Clinical Impression(s) / ED Diagnoses Final diagnoses:  Dehydration  Weakness  Delirium  Chronic diastolic congestive heart failure (HCC)  Hepatic cirrhosis, unspecified hepatic cirrhosis type, unspecified whether ascites present ([place])  Chronic kidney disease, unspecified CKD stage  Chronic respiratory failure with hypoxia [facility])    Rx / DC Orders ED Discharge Orders     None         Varney Biles, MD 05/28/22 0002

## 2022-05-27 NOTE — Discharge Instructions (Addendum)
[name] was seen in the emergency room for low blood pressure.  Her blood pressure have been stable and within normal range in the emergency room.  [name] is slightly dehydrated.  She has received 1 L of fluid in the ER.  Her blood work-up here indicates renal failure that is at baseline, electrolytes that are overall reassuring, ammonia that is in the 40s.  Her hemoglobin is also stable at 11.2.  We recommend the following: -[name] will need close monitoring for hemodynamic status, hydration status and mental status.  Please bring her back to the emergency room if her blood pressure is below 90 systolic, she is not able to tolerate any liquid, or there is any profound confusion or somnolence. -[name]'s hemoglobin is stable, however she is at risk for bleeding given her recent admission to the hospital and her liver disease.  Please monitor her stool output.  If you start noticing coffee-ground emesis or dark, tarry stools -please bring her back to the emergency room.  [name] has some pulmonary congestion on chest x-ray.  She does not require any new oxygen.  Please return to the ER if she starts having worsening shortness of breath, increased oxygen requirement.  We recommend that the medical staff reassessed the patient as soon as possible and also evaluate all her chronic medications to ensure there is no unnecessary interaction given her liver and kidney disease.

## 2022-05-27 NOTE — ED Provider Notes (Signed)
  Provider Note MRN:  [id number]  Arrival date & time: 05/28/22    ED Course and Medical Decision Making  Assumed care from Dr. Rhunette Croft at shift change.  History of Elita Boone sent here from nursing facility with low blood pressures, possible delirium.  Seems to be mostly at baseline with reassuring work-up, blood pressures tend to run low based on chart review.  Awaiting urinalysis, should be able to be discharged plus or minus antibiotics.  Procedures  Final Clinical Impressions(s) / ED Diagnoses     ICD-10-CM   1. Dehydration  E86.0     2. Weakness  R53.1     3. Delirium  R41.0     4. Chronic diastolic congestive heart failure (HCC)  I50.32     5. Hepatic cirrhosis, unspecified hepatic cirrhosis type, unspecified whether ascites present (HCC)  K74.60     6. Chronic kidney disease, unspecified CKD stage  N18.9     7. Chronic respiratory failure with hypoxia (HCC)  J96.11       ED Discharge Orders     None         Discharge Instructions      Ms. Zakeria was seen in the emergency room for low blood pressure.  Her blood pressure have been stable and within normal range in the emergency room.  Ms. Moon is slightly dehydrated.  She has received 1 L of fluid in the ER.  Her blood work-up here indicates renal failure that is at baseline, electrolytes that are overall reassuring, ammonia that is in the 40s.  Her hemoglobin is also stable at 11.2.  We recommend the following: -Ms. Brooklen will need close monitoring for hemodynamic status, hydration status and mental status.  Please bring her back to the emergency room if her blood pressure is below 90 systolic, she is not able to tolerate any liquid, or there is any profound confusion or somnolence. -Ms. Nichola's hemoglobin is stable, however she is at risk for bleeding given her recent admission to the hospital and her liver disease.  Please monitor her stool output.  If you start noticing coffee-ground emesis or dark,  tarry stools -please bring her back to the emergency room.  Ms. Zohra has some pulmonary congestion on chest x-ray.  She does not require any new oxygen.  Please return to the ER if she starts having worsening shortness of breath, increased oxygen requirement.  We recommend that the medical staff reassessed the patient as soon as possible and also evaluate all her chronic medications to ensure there is no unnecessary interaction given her liver and kidney disease.      Elmer Sow. Pilar Plate, MD [facility] Emergency Medicine [facility] [email or web address]    Sabas Sous, MD 05/28/22 [phone]

## 2022-05-28 DIAGNOSIS — J9611 Chronic respiratory failure with hypoxia: Secondary | ICD-10-CM | POA: Diagnosis not present

## 2022-05-28 LAB — URINALYSIS, ROUTINE W REFLEX MICROSCOPIC
Bacteria, UA: NONE SEEN
Bilirubin Urine: NEGATIVE
Glucose, UA: NEGATIVE mg/dL
Ketones, ur: NEGATIVE mg/dL
Leukocytes,Ua: NEGATIVE
Nitrite: NEGATIVE
Protein, ur: 30 mg/dL — AB
Specific Gravity, Urine: 1.014 (ref 1.005–1.030)
pH: 5 (ref 5.0–8.0)

## 2022-05-28 LAB — LACTIC ACID, PLASMA: Lactic Acid, Venous: 3 mmol/L (ref 0.5–1.9)

## 2022-05-29 ENCOUNTER — Non-Acute Institutional Stay (INDEPENDENT_AMBULATORY_CARE_PROVIDER_SITE_OTHER): Payer: Medicare Other | Admitting: Family Medicine

## 2022-05-29 ENCOUNTER — Other Ambulatory Visit (HOSPITAL_COMMUNITY)
Admission: RE | Admit: 2022-05-29 | Discharge: 2022-05-29 | Disposition: A | Discharge status: Home / Self Care | Payer: Medicare Other | Source: Skilled Nursing Facility | Attending: Adult Health | Admitting: Adult Health

## 2022-05-29 DIAGNOSIS — K7460 Unspecified cirrhosis of liver: Secondary | ICD-10-CM

## 2022-05-29 DIAGNOSIS — J9611 Chronic respiratory failure with hypoxia: Secondary | ICD-10-CM

## 2022-05-29 DIAGNOSIS — Z7189 Other specified counseling: Secondary | ICD-10-CM

## 2022-05-29 DIAGNOSIS — K7469 Other cirrhosis of liver: Secondary | ICD-10-CM | POA: Insufficient documentation

## 2022-05-29 DIAGNOSIS — K7581 Nonalcoholic steatohepatitis (NASH): Secondary | ICD-10-CM

## 2022-05-29 LAB — CBC WITH DIFFERENTIAL/PLATELET
Abs Immature Granulocytes: 0.03 10*3/uL (ref 0.00–0.07)
Basophils Absolute: 0 10*3/uL (ref 0.0–0.1)
Basophils Relative: 0 %
Eosinophils Absolute: 0 10*3/uL (ref 0.0–0.5)
Eosinophils Relative: 0 %
HCT: 30.6 % — ABNORMAL LOW (ref 36.0–46.0)
Hemoglobin: 10.3 g/dL — ABNORMAL LOW (ref 12.0–15.0)
Immature Granulocytes: 0 %
Lymphocytes Relative: 20 %
Lymphs Abs: 1.7 10*3/uL (ref 0.7–4.0)
MCH: 31.9 pg (ref 26.0–34.0)
MCHC: 33.7 g/dL (ref 30.0–36.0)
MCV: 94.7 fL (ref 80.0–100.0)
Monocytes Absolute: 1.3 10*3/uL — ABNORMAL HIGH (ref 0.1–1.0)
Monocytes Relative: 14 %
Neutro Abs: 5.7 10*3/uL (ref 1.7–7.7)
Neutrophils Relative %: 66 %
Platelets: 114 10*3/uL — ABNORMAL LOW (ref 150–400)
RBC: 3.23 MIL/uL — ABNORMAL LOW (ref 3.87–5.11)
RDW: 17.8 % — ABNORMAL HIGH (ref 11.5–15.5)
WBC: 8.8 10*3/uL (ref 4.0–10.5)
nRBC: 0.3 % — ABNORMAL HIGH (ref 0.0–0.2)

## 2022-05-29 LAB — COMPREHENSIVE METABOLIC PANEL
ALT: 61 U/L — ABNORMAL HIGH (ref 0–44)
AST: 109 U/L — ABNORMAL HIGH (ref 15–41)
Albumin: 1.5 g/dL — ABNORMAL LOW (ref 3.5–5.0)
Alkaline Phosphatase: 152 U/L — ABNORMAL HIGH (ref 38–126)
Anion gap: 6 (ref 5–15)
BUN: 40 mg/dL — ABNORMAL HIGH (ref 8–23)
CO2: 24 mmol/L (ref 22–32)
Calcium: 9.6 mg/dL (ref 8.9–10.3)
Chloride: 102 mmol/L (ref 98–111)
Creatinine, Ser: 2.54 mg/dL — ABNORMAL HIGH (ref 0.44–1.00)
GFR, Estimated: 20 mL/min — ABNORMAL LOW (ref >60)
Glucose, Bld: 68 mg/dL — ABNORMAL LOW (ref 70–99)
Potassium: 3.9 mmol/L (ref 3.5–5.1)
Sodium: 132 mmol/L — ABNORMAL LOW (ref 135–145)
Total Bilirubin: 2 mg/dL — ABNORMAL HIGH (ref 0.3–1.2)
Total Protein: 7.1 g/dL (ref 6.5–8.1)

## 2022-05-29 NOTE — Progress Notes (Cosign Needed)
Location:  [facility]   Place of Service:      CODE STATUS: DNR  Allergies  Allergen Reactions   Azithromycin Hives   Lisinopril Cough   Morphine Hives   Sulfa Antibiotics Other (See Comments)    Yeast infection    CC: pain all over   HPI:  Patient reports that she has pain all over her body and is feeling miserable. During the examination, patient needing to urgently use the restroom. Discussed care with patient's daughter who states that last night the patient, her family, and nursing had discussions regarding her care and patient expressed the desire to no longer live. She is wanting to have her pain controlled better and pass. Family is in agreement with wanting her to be comfortable. Daughter notes an allergy to morphine including hives but is unsure if there is any further allergic reaction. She has minimal oral intake and had to go to the ER over the weekend and was given fluids at that time. The family has since decided to move forward with the MOST form and do not want further interventions at this time.   Past Medical History:  Diagnosis Date   Abnormal uterine bleeding    Anxiety    Arthritis    Chronic abdominal pain    Chronic pain in left foot    Depression    Elevated liver function tests 2018   Endometriosis    Fibromyalgia    GERD 12/21/2009   Qualifier: Diagnosis of  By: Craige Cotta     Hypertension    stopped meds in Aug 2015   Hypothyroidism    IBS (irritable bowel syndrome)    Internal hemorrhoids    Kidney stone    Liver cirrhosis secondary to NASH (HCC)    Obesity, morbid (HCC)    PONV (postoperative nausea and vomiting)    Psoriatic arthritis ([place])    Pulmonary embolism ([place]) 12/2005   Qualifier: Diagnosis of  By: Kellie Simmering LPN, Almyra Free     Rheumatoid arthritis [facility])    Sleep apnea    Vitamin D deficiency     Past Surgical History:  Procedure Laterality Date   ABDOMINAL SURGERY     laparoscopy   BIOPSY N/A 03/24/2014   Procedure:  GASTRIC BIOPSIES;  Surgeon: Danie Binder, MD;  Location: [facility];  Service: Endoscopy;  Laterality: N/A;   BIOPSY  10/28/2019   Procedure: BIOPSY;  Surgeon: Danie Binder, MD;  [facility]: [facility];  Service: Endoscopy;;  gastric nodule   BREAST LUMPECTOMY Right    CESAREAN SECTION     X2   COLONOSCOPY  2006   internal hemorrhoids   COLONOSCOPY WITH PROPOFOL N/A 03/24/2014   Dr. [name]: 2 tubular adenomas removed, hemorrhoids   COLONOSCOPY WITH PROPOFOL N/A 10/28/2019   Fields: External and internal hemorrhoids, 8 polyps ranging from 2 to 5 mm in size removed from the colon.  Multiple tubular adenomas.  Next colonoscopy in 3 years.   CYSTOSCOPY W/ RETROGRADES  01/23/2012   Procedure: CYSTOSCOPY WITH RETROGRADE PYELOGRAM;  Surgeon: Marissa Nestle, MD;  Location: [facility];  Service: Urology;  Laterality: Left;   DILATATION & CURETTAGE/HYSTEROSCOPY WITH MYOSURE N/A 11/18/2014   Procedure: DILATATION & CURETTAGE/HYSTEROSCOPY WITH MYOSURE, resection of polyp;  Surgeon: Cheri Fowler, MD;  Location: [place] [facility];  Service: Gynecology;  Laterality: N/A;   DILATION AND CURETTAGE OF UTERUS     ESOPHAGOGASTRODUODENOSCOPY   08/24/2006   Dr. Veto Kemps erythema of the antrum without erosion  or ulcers/Otherwise, normal esophagus without evidence of Barrett's path with chronic gastritis   ESOPHAGOGASTRODUODENOSCOPY (EGD) WITH PROPOFOL N/A 03/24/2014   Dr. [name]: gastritis   ESOPHAGOGASTRODUODENOSCOPY (EGD) WITH PROPOFOL N/A 10/28/2019   Fields: Esophagus appeared normal, empiric dilation due to history of dysphagia.  6 mm nodule seen in the gastric cardia.  Mild portal hypertensive gastropathy.  Nodule from the stomach biopsied and showed mild chronic gastritis, no H. pylori.   POLYPECTOMY N/A 03/24/2014   Procedure: POLYPECTOMY;  Surgeon: Danie Binder, MD;  Location: [facility];  Service: Endoscopy;  Laterality: N/A;   POLYPECTOMY  10/28/2019   Procedure: POLYPECTOMY;  Surgeon: Danie Binder, MD;   [facility];  Service: Endoscopy;;  hepatic flexure, ascending colon,sigmoid colon, rectal   REMOVAL OF STONES  01/23/2012   Procedure: REMOVAL OF STONES;  Surgeon: Marissa Nestle, MD;  Location: [facility];  Service: Urology;  Laterality: N/A;   SAVORY DILATION N/A 10/28/2019   Procedure: SAVORY DILATION;  Surgeon: Danie Binder, MD;  [facility];  Service: Endoscopy;  Laterality: N/A;   TUBAL LIGATION      Social History   Socioeconomic History   Marital status: Divorced    Spouse name: Not on file   Number of children: 2   Years of education: 14   Highest education level: Not on file  Occupational History   Occupation: Social research officer, government: [employer]  Tobacco Use   Smoking status: Former    Years: 20.00    Types: Cigarettes    Quit date: 10/24/1998    Years since quitting: 23.6   Smokeless tobacco: Never   Tobacco comments:    1 cig daily  Vaping Use   Vaping Use: Never used  Substance and Sexual Activity   Alcohol use: Not Currently    Comment: "no alcohol in years"   Drug use: No   Sexual activity: Never    Birth control/protection: Post-menopausal  Other Topics Concern   Not on file  Social History Narrative   Right handed   One story with a basement   Drinks caffeine   Social Determinants of Health   Financial Resource Strain: Not on file  Food Insecurity: No Food Insecurity (04/20/2022)   Hunger Vital Sign    Worried About Running Out of Food in the Last Year: Never true    Ran Out of Food in the Last Year: Never true  Transportation Needs: No Transportation Needs (04/20/2022)   PRAPARE - Hydrologist (Medical): No    Lack of Transportation (Non-Medical): No  Physical Activity: Not on file  Stress: Not on file  Social Connections: Not on file  Intimate Partner Violence: Not At Risk (04/20/2022)   Humiliation, Afraid, Rape, and Kick questionnaire    Fear of Current or  Ex-Partner: No    Emotionally Abused: No    Physically Abused: No    Sexually Abused: No   Family History  Problem Relation Age of Onset   Heart attack Mother        CABG   Stroke Mother    Hypertension Mother    Cancer Mother    Diabetes Mother    Heart attack Father        CABG   Hypertension Father    Mesothelioma Father    Heart attack Brother    Diabetes Brother    Depression Brother    Hyperlipidemia Brother  Diabetes Maternal Grandmother    Diabetes Maternal Grandfather    Cancer Paternal Grandmother    Diabetes Brother    Hyperlipidemia Brother    Wilson's disease Other        2 nieces, one died in her 35s   Colon cancer Neg Hx       VITAL SIGNS HR 90, RR 18, BP 102/59, O2 saturation 96%  LMP 07/11/2007 (Approximate)   Outpatient Encounter Medications as of 05/29/2022  Medication Sig   acetaminophen (TYLENOL) 325 MG tablet Take 2 tablets (650 mg total) by mouth every 6 (six) hours as needed for mild pain, headache or fever (or Fever >/= 101).   albuterol (VENTOLIN HFA) 108 (90 Base) MCG/ACT inhaler Inhale 1-2 puffs into the lungs every 6 (six) hours as needed for wheezing or shortness of breath.   amiodarone (PACERONE) 200 MG tablet Take 1 tablet (200 mg total) by mouth daily.   apixaban (ELIQUIS) 5 MG TABS tablet Take 1 tablet (5 mg total) by mouth 2 (two) times daily.   aspirin EC 81 MG tablet Take 81 mg by mouth daily. Swallow whole.   blood glucose meter kit and supplies Dispense based on patient and insurance preference. Use up to four times daily as directed. (FOR ICD-10 E10.9, E11.9).   budesonide-formoterol (SYMBICORT) 80-4.5 MCG/ACT inhaler Take 2 puffs first thing in am and then another 2 puffs about 12 hours later. (Patient taking differently: Inhale 2 puffs into the lungs 2 (two) times daily. Take 2 puffs first thing in am and then another 2 puffs about 12 hours later.)   Cholecalciferol (VITAMIN D3) 2000 units TABS Take 2,000 Units by mouth daily.     DULoxetine (CYMBALTA) 60 MG capsule Take 60 mg by mouth daily.   folic acid (FOLVITE) 1 MG tablet Take 1 tablet (1 mg total) by mouth daily.   furosemide (LASIX) 40 MG tablet Take 1 tablet (40 mg total) by mouth 2 (two) times daily.   inFLIXimab (REMICADE IV) Inject 1 Syringe into the vein every 3 (three) months.   lactulose, encephalopathy, (CHRONULAC) 10 GM/15ML SOLN Take 30 mLs (20 g total) by mouth 2 (two) times daily. Goal is 2 to 3 soft/mushy BMs, Ok to take additional dose if No BM in 24 hrs   levothyroxine (SYNTHROID) 75 MCG tablet Take 75 mcg by mouth daily.   Melatonin 10 MG CAPS Take 10 mg by mouth at bedtime as needed (sleep).   metoprolol tartrate (LOPRESSOR) 25 MG tablet Take 0.5 tablets (12.5 mg total) by mouth 2 (two) times daily.   midodrine (PROAMATINE) 10 MG tablet Take 1 tablet (10 mg total) by mouth 2 (two) times daily with a meal.   pantoprazole (PROTONIX) 40 MG tablet Take 40 mg by mouth 2 (two) times daily.   potassium chloride 20 MEQ TBCR Take 20 mEq by mouth daily. Take While taking Lasix/furosemide   rosuvastatin (CRESTOR) 10 MG tablet Take 10 mg by mouth daily.   No facility-administered encounter medications on file as of 05/29/2022.     SIGNIFICANT DIAGNOSTIC EXAMS General: chronically ill-appearing, somewhat disoriented in the room with minimal conversational response HEENT: dry and cracked lips with some blood  Respiratory: breathing comfortably with nasal cannula in place, non-labored breathing MSK: patient able to transfer from the bed to the wheelchair with assistance, some decreased tone GU: no obvious rashes present, some blood present on the patient's padding after sitting up.   ASSESSMENT/ PLAN: Goals of Care Patient and family preferring to now  go towards comfort care and hospice/palliative. Patient reports being in significant pain and is expressing the will to pass on her own. Given the minimal oral intake and the MOST form now being completed  with no plans for intervention and goal of comfort care, recommend discontinuation of many medications as listed below. - Plan for palliative/hospice for comfort care - Recommend discontinuation of the following medications: Eliquis, folic acid, furosemide, lactulose, melatonin, midodrine, mirtazapine, potassium supplement, rosuvastatin, vitamins, and diflucan, lactulose - Decrease Duloxetine to 13m from 630mwith plans to discontinue - Continue metoprolol and levothyroxine if getting symptomatic benefit  - Fentanyl 12.73m46mpatches every 72 hours (reports morphine allergy), recommend bridging with Ativan as needed. - Place foley catheter if able due to poor ambulatory ability   Heart Failure Patient without evidence of volume overload and has minimal oral intake. Given the transition to comfort care, recommend the following: - Stop lasix and potassium supplementation   Paroxysmal Atrial fibrillation Given comfort care and current bleeding in GU region, will recommend discontinuation of blood thinners. Can consider continuing Metoprolol and amiodarone as needed to avoid Afib with RVR that could be symptomatic - Discontinue Eliquis, ASA - Continue metoprolol, consider stopping amiodarone if HR controlled with metoprolol  Hypothyroidism Goal of comfort care, some symptoms can be improved with levothyroxine. Notably has stable TSH level and given the limited life expectancy, could consider either continuation or discontinuation.     Yolanda [name]  PGY-3 Family Medicine Resident  Anorexia persistent due to her complaint that "everything tastes like aluminum.".  She was seen in the ED over the weekend because of hypotension and received IV fluids. The family has requested Hospice directly and when I entered the room the daughter was discussing her mother's care with the Hospice nurse. They both validate that she remains in bed getting up only to go to the bathroom.  [facility]  [facility] blood on the bed and wheelchair.  The exact source as to vaginal versus rectal was unclear.  On exam she appeared to be resting quietly.  She was on oxygen.  Breath sounds are decreased.  Cardiac rhythm was regular and rate well controlled.  She did moan when I palpated the abdomen.  Pedal pulses are decreased.  There is no significant clinical peripheral edema.  She exhibits hyperpigmentation in an irregular pattern over the shins.  There is ecchymosis greater on the right upper extremity than the left.  The family wants comfort measures only.  Hospice nurse and I discussed care management.  Deprescribing will be pursued with discontinuation of Eliquis, multivitamins, folic acid, melatonin, furosemide, Crestor, aspirin, Diflucan ,midodrine, PPI, Carafate, and lactulose.  Duloxetine will be weaned and possibly discontinued.  Foley catheter will be placed as she is terminal and ambulatory only with maximum assist.  Because of the morphine allergy manifested as urticaria; fentanyl 12 mcg patch will be initiated every 72 hours.  Acetaminophen will be administered as 500 mg 4 times daily as maintenance.  Hospice recommended Ativan 0.5 mg crushed and given in liquids until the fentanyl can kick in presumably in the next 12-18 hours.

## 2022-05-30 ENCOUNTER — Non-Acute Institutional Stay (SKILLED_NURSING_FACILITY): Payer: Medicare Other | Admitting: Internal Medicine

## 2022-05-30 ENCOUNTER — Encounter: Payer: Self-pay | Admitting: Family Medicine

## 2022-05-30 DIAGNOSIS — I5033 Acute on chronic diastolic (congestive) heart failure: Secondary | ICD-10-CM | POA: Diagnosis not present

## 2022-05-30 DIAGNOSIS — R627 Adult failure to thrive: Secondary | ICD-10-CM

## 2022-05-30 DIAGNOSIS — E7220 Disorder of urea cycle metabolism, unspecified: Secondary | ICD-10-CM

## 2022-05-30 DIAGNOSIS — I480 Paroxysmal atrial fibrillation: Secondary | ICD-10-CM | POA: Diagnosis not present

## 2022-05-30 LAB — URINE CULTURE: Culture: 1000 — AB

## 2022-05-31 ENCOUNTER — Telehealth (HOSPITAL_BASED_OUTPATIENT_CLINIC_OR_DEPARTMENT_OTHER): Payer: Self-pay

## 2022-05-31 ENCOUNTER — Encounter: Payer: Self-pay | Admitting: Internal Medicine

## 2022-05-31 DIAGNOSIS — R627 Adult failure to thrive: Secondary | ICD-10-CM | POA: Insufficient documentation

## 2022-05-31 NOTE — Assessment & Plan Note (Signed)
Clinically rhythm & rate are normal.

## 2022-05-31 NOTE — Telephone Encounter (Signed)
Post ED Visit - Positive Culture Follow-up  Culture report reviewed by antimicrobial stewardship pharmacist: [place] Team [x]  Bertis Ruddy, Pharm.D. []  Heide Guile, Pharm.D., BCPS AQ-ID []  Parks Neptune, Pharm.D., BCPS []  Alycia Rossetti, Pharm.D., BCPS []  [place], [place].D., BCPS, AAHIVP []  Legrand Como, Pharm.D., BCPS, AAHIVP []  Salome Arnt, PharmD, BCPS []  Johnnette Gourd, PharmD, BCPS []  Hughes Better, PharmD, BCPS []  Leeroy Cha, PharmD []  Laqueta Linden, PharmD, BCPS []  Albertina Parr, PharmD  [place] Team []  Leodis Sias, PharmD []  Lindell Spar, PharmD []  Royetta Asal, PharmD []  Graylin Shiver, Rph []  Rema Fendt) Glennon Mac, PharmD []  Arlyn Dunning, PharmD []  Netta Cedars, PharmD []  Dia Sitter, PharmD []  Leone Haven, PharmD []  Gretta Arab, PharmD []  Theodis Shove, PharmD []  Peggyann Juba, PharmD []  Reuel Boom, PharmD  Reviewed by ED provider Janeece Fitting, PA-C Positive urine culture Not treated, no UTI s/s, do not treat and no further patient follow-up is required at this time.  Glennon Hamilton 05/31/2022, 9:04 AM

## 2022-05-31 NOTE — Assessment & Plan Note (Addendum)
Her failure to thrive is related to multiple organ dysfunction/failure.  The most significant factors are the NASH cirrhosis with associated hyperammonemia; AKI superimposed on CKD; and minimally compensated heart failure. Clinically she is terminal and the family comprehends this.  They have requested comfort care measures only with deprescribing as much as possible.  This is to include discontinuation of lactulose. Hospice is consulting and will assume terminal care.  As part of comfort measures Ativan will be administered pending response to fentanyl patch. Foley cath indicated as unfortunately she is terminal & requires maximum assist to toilet. Lactulose will be D/Ced  to prevent multiple BMs.

## 2022-05-31 NOTE — Assessment & Plan Note (Addendum)
CHF curently marginally compensated; no NVD or peripheral edema despite NS boli in ED 11/18.

## 2022-05-31 NOTE — Assessment & Plan Note (Signed)
The natural course of progression to hepatic coma, due to progressive elevation of ammonia off lactulose was discussed with the daughter.  This is the family's preference.

## 2022-06-05 ENCOUNTER — Telehealth: Payer: Self-pay

## 2022-06-05 NOTE — Telephone Encounter (Signed)
FYI:  This pt passed on last week the 21st @ the [facility]. [facility] has her remains. Her memorial service is Thursday , November 30th.

## 2022-06-05 NOTE — Telephone Encounter (Signed)
noted 

## 2022-06-07 ENCOUNTER — Telehealth (HOSPITAL_COMMUNITY): Payer: Self-pay

## 2022-06-07 ENCOUNTER — Other Ambulatory Visit: Payer: Self-pay

## 2022-06-07 NOTE — Telephone Encounter (Signed)
Called to schedule Remicade - patient Daughter stated patient passed away last week. COndolences expressed

## 2022-06-09 NOTE — Progress Notes (Unsigned)
   NURSING HOME LOCATION:  [facility] ROOM NUMBER:  [id number] P  CODE STATUS:  DNR  PCP: Celene Squibb MD  This is a nursing facility follow up visit for specific acute issue of failure to thrive in the context of multiple organ dysfunction/failure & to assess prognosis & appropriate therapeutic plan.  HPI: She was seen in the ED 11/18 because of hypotension and possible hematuria and melena.  The patient had severe anorexia with minimal solid or liquid intake. The possible melena was in the context of recent endoluminal evaluations which revealed gastritis and internal hemorrhoids.  Despite NASH cirrhosis there was no evidence of varices. Blood pressure was soft with values of 92/51; pulse rate was normal at 81.  The patient was somnolent but arousable and responses were to be clinically appropriate.  The majority of the history was provided by her daughters.  CKD stage IV is present with a creatinine of 2.66 and GFR of 19.  H/H revealed values of 11.4/33.8.  Mild thrombocytopenia was present with a platelet count of 126,000.  Ammonia level was 49.  BUN was 1.7 indicating severe protein/caloric malnutrition.  Transaminitis was present with AST of 112 and ALT of 66.  FOB was positive.  Urinalysis did reveal moderate hemoglobin and 30 mg of protein.  There was no evidence to suggest UTI. She received saline boli of 1 L of fluid with clinical stabilization.  She returned to the SNF to complete rehab. Hospice was contacted by the family after her return from the ED.   During my visit 11/20 her daughter stated that their mother wanted no aggressive intervention .  Anorexia was validated; apparently the patient has told the family that "everything tastes like aluminum.". [facility] assessed the patient as well as part of their geriatric training and presented their findings to me.  My comments were in the form of an addendum to their 11/20 note.  Review of systems: Again the daughter is primary  historian; the patient remained somnolent or asleep throughout the interview and exam, essentially nonverbal.  Hospice nurse was discussing therapeutic intervention when I entered the room. Profound weakness was validated; this necessitated maximum assist for the patient to go to the bathroom.  This some blood was noted on the bed and on the wheelchair; source could not be verified.  Physical exam:  Pertinent or positive findings: As noted there was essentially no interaction during the exam.  She was wearing nasal oxygen.  Rhythm was regular and rate well controlled.  Breath sounds are decreased.  She did moan when I palpated the right upper quadrant.  There was no definite organomegaly or mass noted.  Pedal pulses were decreased.  No peripheral edema was present.  She did exhibit ecchymoses greater on the right upper extremity than the left.  General appearance:  no acute distress, increased work of breathing is present.   Lymphatic: No lymphadenopathy about the head, neck, axilla. Neck:  No thyromegaly, masses, tenderness noted.    Heart:  No gallop, murmur, click, rub .  Lungs:  without wheezes, rhonchi, rales, rubs. GU: Deferred  Extremities:  No cyanosis, clubbing, edema  Neurologic exam :Balance, Rhomberg, finger to nose testing could not be completed due to clinical state Skin: Warm & dry w/o tenting. No significant lesions or rash.  See summary under each active problem in the Problem List with associated updated therapeutic plan

## 2022-06-09 NOTE — Patient Instructions (Signed)
See assessment and plan under each diagnosis in the problem list and acutely for this visit 

## 2022-06-09 DEATH — deceased

## 2022-06-13 ENCOUNTER — Inpatient Hospital Stay: Payer: Medicare Other | Admitting: Gastroenterology

## 2022-06-15 ENCOUNTER — Encounter (HOSPITAL_COMMUNITY): Payer: Self-pay | Admitting: Rheumatology

## 2022-06-22 ENCOUNTER — Ambulatory Visit: Payer: Medicare Other | Admitting: Internal Medicine

## 2022-07-06 ENCOUNTER — Ambulatory Visit: Payer: Medicare Other | Admitting: Nutrition

## 2022-07-17 ENCOUNTER — Ambulatory Visit: Payer: Medicare Other | Admitting: Pulmonary Disease

## 2022-12-11 ENCOUNTER — Encounter: Payer: Self-pay | Admitting: Nutrition

## 2023-03-07 ENCOUNTER — Ambulatory Visit: Payer: Medicare Other | Admitting: Urology

## 2024-02-14 NOTE — Progress Notes (Signed)
 This encounter was created in error - please disregard.
